# Patient Record
Sex: Female | Born: 1935 | Race: White | Hispanic: No | Smoking: Former smoker
Health system: Southern US, Community
[De-identification: ages and names within clinical notes are randomized; demographics above are authoritative.]

## PROBLEM LIST (undated history)

## (undated) DIAGNOSIS — I4891 Unspecified atrial fibrillation: Secondary | ICD-10-CM

## (undated) DIAGNOSIS — I10 Essential (primary) hypertension: Secondary | ICD-10-CM

## (undated) DIAGNOSIS — F32A Depression, unspecified: Secondary | ICD-10-CM

## (undated) DIAGNOSIS — D689 Coagulation defect, unspecified: Secondary | ICD-10-CM

## (undated) DIAGNOSIS — E079 Disorder of thyroid, unspecified: Secondary | ICD-10-CM

## (undated) DIAGNOSIS — E039 Hypothyroidism, unspecified: Secondary | ICD-10-CM

## (undated) DIAGNOSIS — J449 Chronic obstructive pulmonary disease, unspecified: Secondary | ICD-10-CM

## (undated) DIAGNOSIS — N3281 Overactive bladder: Secondary | ICD-10-CM

## (undated) DIAGNOSIS — C801 Malignant (primary) neoplasm, unspecified: Secondary | ICD-10-CM

## (undated) DIAGNOSIS — D509 Iron deficiency anemia, unspecified: Secondary | ICD-10-CM

## (undated) DIAGNOSIS — K921 Melena: Secondary | ICD-10-CM

## (undated) DIAGNOSIS — F329 Major depressive disorder, single episode, unspecified: Secondary | ICD-10-CM

## (undated) DIAGNOSIS — F419 Anxiety disorder, unspecified: Secondary | ICD-10-CM

## (undated) HISTORY — DX: Coagulation defect, unspecified: D68.9

## (undated) HISTORY — DX: Overactive bladder: N32.81

## (undated) HISTORY — DX: Depression, unspecified: F32.A

## (undated) HISTORY — DX: Major depressive disorder, single episode, unspecified: F32.9

## (undated) HISTORY — DX: Anxiety disorder, unspecified: F41.9

## (undated) HISTORY — PX: ABDOMINAL HYSTERECTOMY: SHX81

## (undated) HISTORY — DX: Disorder of thyroid, unspecified: E07.9

## (undated) HISTORY — DX: Chronic obstructive pulmonary disease, unspecified: J44.9

## (undated) HISTORY — DX: Unspecified atrial fibrillation: I48.91

## (undated) HISTORY — DX: Melena: K92.1

## (undated) HISTORY — PX: BREAST LUMPECTOMY: SHX2

---

## 2011-08-22 DIAGNOSIS — I480 Paroxysmal atrial fibrillation: Secondary | ICD-10-CM | POA: Insufficient documentation

## 2011-12-20 DIAGNOSIS — I10 Essential (primary) hypertension: Secondary | ICD-10-CM | POA: Insufficient documentation

## 2013-10-22 DIAGNOSIS — I471 Supraventricular tachycardia: Secondary | ICD-10-CM | POA: Insufficient documentation

## 2014-10-11 DIAGNOSIS — Z86718 Personal history of other venous thrombosis and embolism: Secondary | ICD-10-CM | POA: Insufficient documentation

## 2015-10-25 DIAGNOSIS — J9611 Chronic respiratory failure with hypoxia: Secondary | ICD-10-CM | POA: Insufficient documentation

## 2016-01-23 DIAGNOSIS — E034 Atrophy of thyroid (acquired): Secondary | ICD-10-CM | POA: Insufficient documentation

## 2016-06-19 DIAGNOSIS — Z9181 History of falling: Secondary | ICD-10-CM | POA: Insufficient documentation

## 2016-12-18 DIAGNOSIS — J449 Chronic obstructive pulmonary disease, unspecified: Secondary | ICD-10-CM

## 2016-12-18 DIAGNOSIS — J4489 Other specified chronic obstructive pulmonary disease: Secondary | ICD-10-CM

## 2016-12-18 DIAGNOSIS — I701 Atherosclerosis of renal artery: Secondary | ICD-10-CM | POA: Insufficient documentation

## 2016-12-18 HISTORY — DX: Other specified chronic obstructive pulmonary disease: J44.89

## 2016-12-18 HISTORY — DX: Chronic obstructive pulmonary disease, unspecified: J44.9

## 2017-08-13 DIAGNOSIS — Z9181 History of falling: Secondary | ICD-10-CM | POA: Insufficient documentation

## 2017-08-13 DIAGNOSIS — S81802A Unspecified open wound, left lower leg, initial encounter: Secondary | ICD-10-CM | POA: Insufficient documentation

## 2017-12-16 DIAGNOSIS — R600 Localized edema: Secondary | ICD-10-CM | POA: Insufficient documentation

## 2018-01-13 ENCOUNTER — Telehealth: Payer: Self-pay | Admitting: Family Medicine

## 2018-01-13 NOTE — Telephone Encounter (Signed)
Copied from [place] [phone]. Topic: General - Other >> Jan 13, 2018  9:55 AM Oneta Rack wrote: Relation to pt: self Call back number: [phone]   Reason for call:  Littie Deeds - [phone] (patient of Dr. Wynetta Emery) referred her mother to establish care with Dr. Wynetta Emery, daughter states PCP approved. Daughter would like to know if patient can be squeezed in sometime next week due to medication / oxygen refills. Daughter states previous PCP unable to refill, please advise >> Jan 13, 2018  9:59 AM Oneta Rack wrote: Relation to pt: self Call back number: [phone]   Reason for call:  Littie Deeds - [phone] (patient of Dr. Wynetta Emery) referred her mother to establish care with Dr. Wynetta Emery, daughter states PCP approved. Daughter would like to know if patient can be squeezed in sometime next week due to medication / oxygen refill. Daughter states previous PCP unable to refill, please advise >> Jan 13, 2018 10:53 AM Don Perking M wrote: Please advise.

## 2018-01-13 NOTE — Telephone Encounter (Signed)
I am aware of this patient. OK to book next Friday (5/24) unfortunately, I think that's the earliest I can do

## 2018-01-17 ENCOUNTER — Telehealth: Payer: Self-pay | Admitting: Family Medicine

## 2018-01-17 NOTE — Telephone Encounter (Signed)
Unable to reach pt. Will call back to schedule.

## 2018-01-17 NOTE — Telephone Encounter (Signed)
appt scheduled

## 2018-01-17 NOTE — Telephone Encounter (Signed)
Copied from [place] [phone]. Topic: General - Other >> Jan 13, 2018  9:55 AM Oneta Rack wrote: Relation to pt: self Call back number: [phone]   Reason for call:  Littie Deeds - [phone] (patient of Dr. Wynetta Emery) referred her mother to establish care with Dr. Wynetta Emery, daughter states PCP approved. Daughter would like to know if patient can be squeezed in sometime next week due to medication / oxygen refills. Daughter states previous PCP unable to refill, please advise >> Jan 13, 2018  9:59 AM Oneta Rack wrote: Relation to pt: self Call back number: [phone]   Reason for call:  Littie Deeds - [phone] (patient of Dr. Wynetta Emery) referred her mother to establish care with Dr. Wynetta Emery, daughter states PCP approved. Daughter would like to know if patient can be squeezed in sometime next week due to medication / oxygen refill. Daughter states previous PCP unable to refill, please advise >> Jan 13, 2018 10:53 AM Don Perking M wrote: Please advise. >> Jan 17, 2018 10:03 AM Cleaster Corin, NT wrote: Pt. Daughter calling back she gave her number instead of pt. Number. Pt. Can be reached at [phone]

## 2018-02-07 ENCOUNTER — Ambulatory Visit (INDEPENDENT_AMBULATORY_CARE_PROVIDER_SITE_OTHER): Payer: Medicare Other | Admitting: Family Medicine

## 2018-02-07 ENCOUNTER — Encounter: Payer: Self-pay | Admitting: Family Medicine

## 2018-02-07 VITALS — BP 136/65 | HR 56 | Temp 97.4°F | Ht 60.0 in | Wt 94.2 lb

## 2018-02-07 DIAGNOSIS — I4891 Unspecified atrial fibrillation: Secondary | ICD-10-CM

## 2018-02-07 DIAGNOSIS — Z9981 Dependence on supplemental oxygen: Secondary | ICD-10-CM | POA: Insufficient documentation

## 2018-02-07 DIAGNOSIS — N3281 Overactive bladder: Secondary | ICD-10-CM | POA: Insufficient documentation

## 2018-02-07 DIAGNOSIS — E079 Disorder of thyroid, unspecified: Secondary | ICD-10-CM

## 2018-02-07 DIAGNOSIS — Z13220 Encounter for screening for lipoid disorders: Secondary | ICD-10-CM

## 2018-02-07 DIAGNOSIS — S81802A Unspecified open wound, left lower leg, initial encounter: Secondary | ICD-10-CM | POA: Diagnosis not present

## 2018-02-07 DIAGNOSIS — F329 Major depressive disorder, single episode, unspecified: Secondary | ICD-10-CM | POA: Insufficient documentation

## 2018-02-07 DIAGNOSIS — Z23 Encounter for immunization: Secondary | ICD-10-CM

## 2018-02-07 DIAGNOSIS — I10 Essential (primary) hypertension: Secondary | ICD-10-CM

## 2018-02-07 DIAGNOSIS — F339 Major depressive disorder, recurrent, unspecified: Secondary | ICD-10-CM | POA: Diagnosis not present

## 2018-02-07 DIAGNOSIS — J449 Chronic obstructive pulmonary disease, unspecified: Secondary | ICD-10-CM | POA: Insufficient documentation

## 2018-02-07 DIAGNOSIS — H409 Unspecified glaucoma: Secondary | ICD-10-CM

## 2018-02-07 DIAGNOSIS — F32A Depression, unspecified: Secondary | ICD-10-CM | POA: Insufficient documentation

## 2018-02-07 DIAGNOSIS — E039 Hypothyroidism, unspecified: Secondary | ICD-10-CM | POA: Insufficient documentation

## 2018-02-07 LAB — UA/M W/RFLX CULTURE, ROUTINE
Bilirubin, UA: NEGATIVE
Glucose, UA: NEGATIVE
Ketones, UA: NEGATIVE
Leukocytes, UA: NEGATIVE
Nitrite, UA: NEGATIVE
Protein, UA: NEGATIVE
RBC, UA: NEGATIVE
Specific Gravity, UA: 1.015 (ref 1.005–1.030)
Urobilinogen, Ur: 0.2 mg/dL (ref 0.2–1.0)
pH, UA: 6.5 (ref 5.0–7.5)

## 2018-02-07 LAB — MICROALBUMIN, URINE WAIVED
Creatinine, Urine Waived: 100 mg/dL (ref 10–300)
Microalb, Ur Waived: 10 mg/L (ref 0–19)
Microalb/Creat Ratio: <30 mg/g (ref <30)

## 2018-02-07 MED ORDER — METOPROLOL TARTRATE 50 MG PO TABS: 50.0000 mg | ORAL_TABLET | Freq: Two times a day (BID) | ORAL | 1 refills | Status: DC

## 2018-02-07 MED ORDER — ASPIRIN EC 81 MG PO TBEC: 81.0000 mg | DELAYED_RELEASE_TABLET | Freq: Every day | ORAL | 4 refills | Status: DC

## 2018-02-07 MED ORDER — METOPROLOL TARTRATE 25 MG PO TABS: 25.0000 mg | ORAL_TABLET | Freq: Two times a day (BID) | ORAL | 1 refills | Status: DC

## 2018-02-07 MED ORDER — SOLIFENACIN SUCCINATE 10 MG PO TABS: 10.0000 mg | ORAL_TABLET | Freq: Every day | ORAL | 1 refills | Status: DC

## 2018-02-07 MED ORDER — AMLODIPINE BESYLATE 2.5 MG PO TABS: 2.5000 mg | ORAL_TABLET | Freq: Every day | ORAL | 1 refills | Status: DC

## 2018-02-07 NOTE — Assessment & Plan Note (Signed)
Stable. Not on any medicine. Call with any concerns.

## 2018-02-07 NOTE — Assessment & Plan Note (Signed)
Previously oxygen dependent on 2L. Oxygen did not drop with walking. Will recheck next visit.

## 2018-02-07 NOTE — Assessment & Plan Note (Signed)
Rate controlled. In regular rhythm today. Will refill her metoprolol. Continue aspirin. Obtain records. Referral to cardiology generated today.

## 2018-02-07 NOTE — Assessment & Plan Note (Addendum)
Oxygen dependent on 2L. Oxygen did not drop with walking. Will recheck next visit.

## 2018-02-07 NOTE — Progress Notes (Deleted)
Create Note1 Nice Template2 [employer] Base Note Template              My NoteIncomplete(Auto-Saved) 1:53 PM             NoteWriter selections for this note were unable to be saved. Complete documentation in a new note, and then delete this note.                             untitled image     BP (!) 153/71 (BP Location: Left Arm, Patient Position: Sitting, Cuff Size: Normal)   Pulse (!) 57   Temp (!) 97.4 F (36.3 C)   Ht 5' (1.524 m)   Wt 94 lb 4 oz (42.8 kg)   SpO2 96%   BMI 18.41 kg/m        Subjective:        Patient ID: [name], female    DOB: 10/19/1927, 84 y.o.   MRN: [id number]     HPI:  [name] is a 84 y.o. female who presents today to establish care. She recently moved here from [place], [place].         Chief Complaint    Patient presents with    .   Atrial Fibrillation    .   COPD    .   Hypertension    .   Hypothyroidism    .   Establish Care                Active Ambulatory Problems        Diagnosis   Date Noted    .   COPD (chronic obstructive pulmonary disease) ([place])        .   Atrial fibrillation ([place])        .   Depression        .   Overactive bladder        .   Hypothyroidism        .   Essential hypertension   02/07/2018    .   Oxygen dependent   02/07/2018            Resolved Ambulatory Problems        Diagnosis   Date Noted    .   No Resolved Ambulatory Problems            Past Medical History:    Diagnosis   Date    .   Anxiety        .   Atrial fibrillation ([place])        .   COPD (chronic obstructive pulmonary disease) ([place])        .   Depression        .   Overactive bladder        .   Thyroid disease                 Past Surgical History:    Procedure   Laterality   Date    .    ABDOMINAL HYSTERECTOMY            .   BREAST LUMPECTOMY                       Outpatient Encounter Medications as of  02/07/2018    Medication   Sig    .   amLODipine (NORVASC) 2.5 MG tablet   Take 1 tablet (2.5 mg total) by mouth daily.    Marland Kitchen   aspirin EC 81 MG tablet   Take 1 tablet (81 mg total) by mouth daily.    Marland Kitchen   levothyroxine (SYNTHROID, LEVOTHROID) 88 MCG tablet   Take 88 mcg by mouth daily before breakfast.    .   metoprolol tartrate (LOPRESSOR) 25 MG tablet   Take 1 tablet (25 mg total) by mouth 2 (two) times daily.    .   metoprolol tartrate (LOPRESSOR) 50 MG tablet   Take 1 tablet (50 mg total) by mouth 2 (two) times daily.    .   solifenacin (VESICARE) 10 MG tablet   Take 1 tablet (10 mg total) by mouth daily.    .   [DISCONTINUED] amLODipine (NORVASC) 2.5 MG tablet   Take 2.5 mg by mouth daily.    .   [DISCONTINUED] aspirin EC 81 MG tablet   Take 81 mg by mouth daily.    .   [DISCONTINUED] metoprolol tartrate (LOPRESSOR) 25 MG tablet   Take 25 mg by mouth 2 (two) times daily.    .   [DISCONTINUED] metoprolol tartrate (LOPRESSOR) 50 MG tablet   Take 50 mg by mouth 2 (two) times daily.    .   [DISCONTINUED] solifenacin (VESICARE) 10 MG tablet   Take 10 mg by mouth daily.        No facility-administered encounter medications on file as of 02/07/2018.        No Known Allergies      Social History             Socioeconomic History    .   Marital status:   Divorced            Spouse name:   Not on file    .   Number of children:   Not on file    .   Years of education:   Not on file    .   Highest education level:   Not on file    Occupational History    .   Not on file    Social Needs    .   Financial resource strain:   Not on file    .   Food insecurity:            Worry:   Not on file            Inability:    Not on file    .   Transportation needs:            Medical:   Not on file            Non-medical:   Not on file    Tobacco Use    .   Smoking status:   Former Research scientist (life sciences)    .   Smokeless tobacco:   Never Used    Substance and Sexual Activity    .   Alcohol use:   Never            Frequency:   Never    .   Drug use:   Never    .   Sexual activity:   Not Currently    Lifestyle    .   Physical activity:  Days per week:   Not on file            Minutes per session:   Not on file    .   Stress:   Not on file    Relationships    .   Social connections:            Talks on phone:   Not on file            Gets together:   Not on file            Attends religious service:   Not on file            Active member of club or organization:   Not on file            Attends meetings of clubs or organizations:   Not on file            Relationship status:   Not on file    Other Topics   Concern    .   Not on file    Social History Narrative    .   Not on file             Family History    Problem   Relation   Age of Onset    .   Heart disease   Mother        .   Parkinson's disease   Father        .   Heart disease   Sister        .   Heart disease   Son           Review of Systems   Constitutional: Negative.    HENT: Negative.    Respiratory: Negative.    Cardiovascular: Negative.    Gastrointestinal: Negative.    Musculoskeletal: Negative.    Neurological: Negative.    Psychiatric/Behavioral: Negative.          Per HPI unless specifically indicated above          Objective:      BP (!) 153/71 (BP Location: Left Arm, Patient Position: Sitting, Cuff Size: Normal)   Pulse (!) 57   Temp (!) 97.4 F (36.3 C)   Ht 5' (1.524 m)   Wt 94 lb 4 oz (42.8  kg)   SpO2 96%   BMI 18.41 kg/m        Wt Readings from Last 3 Encounters:    02/07/18   94 lb 4 oz (42.8 kg)       Physical Exam   Constitutional: She is oriented to person, place, and time. She appears well-developed and well-nourished. No distress.   HENT:   Head: Normocephalic and atraumatic.  Right Ear: Hearing normal.  Left Ear: Hearing normal.   Nose: Nose normal.   Eyes: Conjunctivae and lids are normal. Right eye exhibits no discharge. Left eye exhibits no discharge. No scleral icterus.   Cardiovascular: Normal rate, regular rhythm, normal heart sounds and intact distal pulses. Exam reveals no gallop and no friction rub.   No murmur heard.  Pulmonary/Chest: Effort normal. No stridor. No respiratory distress. She has wheezes. She has no rales. She exhibits no tenderness.  Musculoskeletal: Normal range of motion.  Neurological: She is alert and oriented to person, place, and time.   Skin: Skin is warm, dry and intact. Capillary refill takes less than 2 seconds. No rash noted. She is not diaphoretic.  No erythema. No pallor.  Open shallow wound about 3 inches long and 1 inch wide on anterior L shin, has been there since February and not healing  Psychiatric: She has a normal mood and affect. Her speech is normal and behavior is normal. Judgment and thought content normal. Cognition and memory are normal.   Nursing note and vitals reviewed.        No results found for this or any previous visit.       Assessment & Plan:    Problem List Items Addressed This Visit      Cardiovascular and Mediastinum   Atrial fibrillation ([place])    Rate controlled. In regular rhythm today. Will refill her metoprolol. Continue aspirin. Obtain records. Referral to cardiology generated today.      Relevant Medications   metoprolol tartrate (LOPRESSOR) 50 MG tablet   metoprolol tartrate (LOPRESSOR) 25 MG tablet   aspirin EC 81 MG tablet   amLODipine (NORVASC) 2.5 MG  tablet   Other Relevant Orders   CBC with Differential/Platelet   Comprehensive metabolic panel   Ambulatory referral to Cardiology   Essential hypertension - Primary    Better on recheck. Continue current regimen. Continue to monitor. Call with any concerns.       Relevant Medications   metoprolol tartrate (LOPRESSOR) 50 MG tablet   metoprolol tartrate (LOPRESSOR) 25 MG tablet   aspirin EC 81 MG tablet   amLODipine (NORVASC) 2.5 MG tablet   Other Relevant Orders   CBC with Differential/Platelet   Comprehensive metabolic panel   Microalbumin, Urine Waived     Respiratory   COPD (chronic obstructive pulmonary disease) ([facility])    Oxygen dependent on 2L. Oxygen did not drop with walking. Will recheck next visit.       Relevant Orders   CBC with Differential/Platelet   Comprehensive metabolic panel     Endocrine   Hypothyroidism    Will check levels and adjust dose as needed. Labs drawn today.      Relevant Medications   levothyroxine (SYNTHROID, LEVOTHROID) 88 MCG tablet   metoprolol tartrate (LOPRESSOR) 50 MG tablet   metoprolol tartrate (LOPRESSOR) 25 MG tablet     Genitourinary   Overactive bladder    Doing well on her vesicare. Continue current regimen. Continue to monitor. Call with any concerns.       Relevant Orders   CBC with Differential/Platelet   Comprehensive metabolic panel   UA/M w/rflx Culture, Routine     Other   Depression    Stable. Not on any medicine. Call with any concerns.       Relevant Orders   CBC with Differential/Platelet   Comprehensive metabolic panel   Oxygen dependent    Previously oxygen dependent on 2L. Oxygen did not drop with walking. Will recheck next visit.        Other Visit Diagnoses    Screening for cholesterol level       Labs checked today. Await results.    Relevant Orders   Lipid Panel w/o Chol/HDL Ratio   Open wound of left lower leg, initial encounter       Has not healed in 4 months. Will get her into wound  center for evaluation.    Relevant Orders   Td : Tetanus/diphtheria >7yo Preservative  free (Completed)   Ambulatory referral to Wound Clinic   Glaucoma of both eyes, unspecified glaucoma type       Does not have her eye drops. Will get her into  opthalmology ASAP for evaluation and treatment.    Relevant Orders   Ambulatory referral to Ophthalmology          Follow up plan:  Return in about 3 months (around 05/10/2018), or Wellness/Physical, for REcords release from [facility] please.

## 2018-02-07 NOTE — Assessment & Plan Note (Signed)
Will check levels and adjust dose as needed. Labs drawn today.

## 2018-02-07 NOTE — Assessment & Plan Note (Signed)
Doing well on her vesicare. Continue current regimen. Continue to monitor. Call with any concerns.

## 2018-02-07 NOTE — Progress Notes (Signed)
BP 136/65 (BP Location: Left Arm, Cuff Size: Normal)   Pulse (!) 56   Temp (!) 97.4 F (36.3 C)   Ht 5' (1.524 m)   Wt 94 lb 4 oz (42.8 kg)   SpO2 96%   BMI 18.41 kg/m    Subjective:    Patient ID: [name], female    DOB: 1928/02/14, 84 y.o.   MRN: [id number]  HPI: [name] is a 84 y.o. female  Chief Complaint  Patient presents with  . Atrial Fibrillation  . COPD  . Hypertension  . Hypothyroidism  . Establish Care   ATRIAL FIBRILLATION  Atrial fibrillation status: stable  Satisfied with current treatment: yes   Medication side effects:  no  Medication compliance: excellent compliance  Palpitations:  no  Chest pain:  no  Dyspnea on exertion:  yes  Orthopnea:  no  Syncope:  no  Edema:  no   Ventricular rate control: B-blocker  Anti-coagulation: aspirin     HYPOTHYROIDISM  Thyroid control status:stable  Satisfied with current treatment? yes  Medication side effects: no  Medication compliance: excellent compliance  Recent dose adjustment:no  Fatigue: no  Cold intolerance: yes  Heat intolerance: no  Weight gain: no  Weight loss: no  Constipation: no  Diarrhea/loose stools: no  Palpitations: no  Lower extremity edema: no  Anxiety/depressed mood: no     HYPERTENSION  Hypertension status: stable   Satisfied with current treatment? yes  Duration of hypertension: chronic  BP monitoring frequency:  not checking  BP medication side effects:  no  Medication compliance: excellent compliance  Previous BP meds:metoprolol and amlodipine  Aspirin: yes  Recurrent headaches: no  Visual changes: no  Palpitations: no  Dyspnea: no  Chest pain: no  Lower extremity edema: no  Dizzy/lightheaded: no     COPD  COPD status: stable  Satisfied with current treatment?: yes  Oxygen use: yes 2L continuously  Dyspnea frequency: often  Cough frequency: often  Rescue inhaler frequency: never     Limitation of activity: yes  Productive cough: no   Relevant past medical, surgical, family and social history reviewed and updated as indicated. Interim medical history since our last visit reviewed. Allergies and medications reviewed and updated.  Review of Systems   Review of Systems  Constitutional: Negative.   HENT: Negative.   Respiratory: Negative.   Cardiovascular: Negative.   Gastrointestinal: Negative.   Musculoskeletal: Negative.   Neurological: Negative.   Psychiatric/Behavioral: Negative.      Per HPI unless specifically indicated above     Objective:    BP 136/65 (BP Location: Left Arm, Cuff Size: Normal)   Pulse (!) 56   Temp (!) 97.4 F (36.3 C)   Ht 5' (1.524 m)   Wt 94 lb 4 oz (42.8 kg)   SpO2 96%   BMI 18.41 kg/m   Wt Readings from Last 3 Encounters:  02/07/18 94 lb 4 oz (42.8 kg)    Physical Exam  Physical Exam  Constitutional: She is oriented to person, place, and time. She appears well-developed and well-nourished. No distress.  HENT:  Head: Normocephalic and atraumatic.  Right Ear: Hearing normal.  Left Ear: Hearing normal.  Nose: Nose normal.  Eyes: Conjunctivae and lids are normal. Right eye exhibits no discharge. Left eye exhibits no discharge. No scleral icterus.  Cardiovascular: Normal rate, regular rhythm, normal heart sounds and intact distal pulses. Exam reveals no gallop and no friction rub.  No murmur heard.  Pulmonary/Chest: Effort normal. No  stridor. No respiratory distress. She has wheezes. She has no rales. She exhibits no tenderness.  Musculoskeletal: Normal range of motion.  Neurological: She is alert and oriented to person, place, and time.  Skin: Skin is warm, dry and intact. Capillary refill takes less than 2 seconds. No rash noted. She is not diaphoretic. No erythema. No pallor.  Open shallow wound about 3 inches long and 1 inch wide on anterior L shin, has been there since February and not healing  Psychiatric:  She has a normal mood and affect. Her speech is normal and behavior is normal. Judgment and thought content normal. Cognition and memory are normal.  Nursing note and vitals reviewed.   No results found for this or any previous visit.    Assessment & Plan:   Problem List Items Addressed This Visit      Cardiovascular and Mediastinum   Atrial fibrillation ([place])    Rate controlled. In regular rhythm today. Will refill her metoprolol. Continue aspirin. Obtain records. Referral to cardiology generated today.      Relevant Medications   metoprolol tartrate (LOPRESSOR) 50 MG tablet   metoprolol tartrate (LOPRESSOR) 25 MG tablet   aspirin EC 81 MG tablet   amLODipine (NORVASC) 2.5 MG tablet   Other Relevant Orders   CBC with Differential/Platelet   Comprehensive metabolic panel   Ambulatory referral to Cardiology   Essential hypertension - Primary    Better on recheck. Continue current regimen. Continue to monitor. Call with any concerns.       Relevant Medications   metoprolol tartrate (LOPRESSOR) 50 MG tablet   metoprolol tartrate (LOPRESSOR) 25 MG tablet   aspirin EC 81 MG tablet   amLODipine (NORVASC) 2.5 MG tablet   Other Relevant Orders   CBC with Differential/Platelet   Comprehensive metabolic panel   Microalbumin, Urine Waived     Respiratory   COPD (chronic obstructive pulmonary disease) ([facility])    Oxygen dependent on 2L. Oxygen did not drop with walking. Will recheck next visit.       Relevant Orders   CBC with Differential/Platelet   Comprehensive metabolic panel     Endocrine   Hypothyroidism    Will check levels and adjust dose as needed. Labs drawn today.      Relevant Medications   levothyroxine (SYNTHROID, LEVOTHROID) 88 MCG tablet   metoprolol tartrate (LOPRESSOR) 50 MG tablet   metoprolol tartrate (LOPRESSOR) 25 MG tablet     Genitourinary   Overactive bladder    Doing well on her vesicare. Continue current regimen. Continue to monitor. Call with  any concerns.       Relevant Orders   CBC with Differential/Platelet   Comprehensive metabolic panel   UA/M w/rflx Culture, Routine     Other   Depression    Stable. Not on any medicine. Call with any concerns.       Relevant Orders   CBC with Differential/Platelet   Comprehensive metabolic panel   Oxygen dependent    Previously oxygen dependent on 2L. Oxygen did not drop with walking. Will recheck next visit.        Other Visit Diagnoses    Screening for cholesterol level       Labs checked today. Await results.    Relevant Orders   Lipid Panel w/o Chol/HDL Ratio   Open wound of left lower leg, initial encounter       Has not healed in 4 months. Will get her into wound center for evaluation.  Relevant Orders   Td : Tetanus/diphtheria >7yo Preservative  free (Completed)   Ambulatory referral to Wound Clinic   Glaucoma of both eyes, unspecified glaucoma type       Does not have her eye drops. Will get her into opthalmology ASAP for evaluation and treatment.    Relevant Orders   Ambulatory referral to Ophthalmology       Follow up plan: Return in about 3 months (around 05/10/2018), or Wellness/Physical, for REcords release from [facility] please.

## 2018-02-07 NOTE — Assessment & Plan Note (Signed)
Better on recheck. Continue current regimen. Continue to monitor. Call with any concerns.  

## 2018-02-08 LAB — CBC WITH DIFFERENTIAL/PLATELET
Basophils Absolute: 0.1 10*3/uL (ref 0.0–0.2)
Basos: 1 %
EOS (ABSOLUTE): 0.4 10*3/uL (ref 0.0–0.4)
Eos: 5 %
Hematocrit: 41.2 % (ref 34.0–46.6)
Hemoglobin: 13.6 g/dL (ref 11.1–15.9)
Immature Grans (Abs): 0 10*3/uL (ref 0.0–0.1)
Immature Granulocytes: 0 %
Lymphocytes Absolute: 2.5 10*3/uL (ref 0.7–3.1)
Lymphs: 27 %
MCH: 29.1 pg (ref 26.6–33.0)
MCHC: 33 g/dL (ref 31.5–35.7)
MCV: 88 fL (ref 79–97)
Monocytes Absolute: 0.7 10*3/uL (ref 0.1–0.9)
Monocytes: 8 %
Neutrophils Absolute: 5.4 10*3/uL (ref 1.4–7.0)
Neutrophils: 59 %
Platelets: 285 10*3/uL (ref 150–450)
RBC: 4.68 x10E6/uL (ref 3.77–5.28)
RDW: 14.6 % (ref 12.3–15.4)
WBC: 9 10*3/uL (ref 3.4–10.8)

## 2018-02-08 LAB — COMPREHENSIVE METABOLIC PANEL
ALT: 11 IU/L (ref 0–32)
AST: 22 IU/L (ref 0–40)
Albumin/Globulin Ratio: 1.7 (ref 1.2–2.2)
Albumin: 4 g/dL (ref 3.5–4.7)
Alkaline Phosphatase: 90 IU/L (ref 39–117)
BUN/Creatinine Ratio: 15 (ref 12–28)
BUN: 17 mg/dL (ref 8–27)
Bilirubin Total: 0.3 mg/dL (ref 0.0–1.2)
CO2: 26 mmol/L (ref 20–29)
Calcium: 9.3 mg/dL (ref 8.7–10.3)
Chloride: 101 mmol/L (ref 96–106)
Creatinine, Ser: 1.17 mg/dL — ABNORMAL HIGH (ref 0.57–1.00)
GFR calc Af Amer: 48 mL/min/{1.73_m2} — ABNORMAL LOW (ref >59)
GFR calc non Af Amer: 42 mL/min/{1.73_m2} — ABNORMAL LOW (ref >59)
Globulin, Total: 2.4 g/dL (ref 1.5–4.5)
Glucose: 83 mg/dL (ref 65–99)
Potassium: 5 mmol/L (ref 3.5–5.2)
Sodium: 138 mmol/L (ref 134–144)
Total Protein: 6.4 g/dL (ref 6.0–8.5)

## 2018-02-08 LAB — THYROID PANEL WITH TSH
Free Thyroxine Index: 2.5 (ref 1.2–4.9)
T3 Uptake Ratio: 27 % (ref 24–39)
T4, Total: 9.2 ug/dL (ref 4.5–12.0)
TSH: 9.33 u[IU]/mL — ABNORMAL HIGH (ref 0.450–4.500)

## 2018-02-08 LAB — LIPID PANEL W/O CHOL/HDL RATIO
Cholesterol, Total: 180 mg/dL (ref 100–199)
HDL: 60 mg/dL (ref >39)
LDL Calculated: 94 mg/dL (ref 0–99)
Triglycerides: 132 mg/dL (ref 0–149)
VLDL Cholesterol Cal: 26 mg/dL (ref 5–40)

## 2018-02-11 ENCOUNTER — Telehealth: Payer: Self-pay | Admitting: Family Medicine

## 2018-02-11 DIAGNOSIS — E079 Disorder of thyroid, unspecified: Secondary | ICD-10-CM

## 2018-02-11 MED ORDER — LEVOTHYROXINE SODIUM 100 MCG PO TABS: 100.0000 ug | ORAL_TABLET | Freq: Every day | ORAL | 1 refills | Status: DC

## 2018-02-11 NOTE — Telephone Encounter (Signed)
Please let her know that her labs came back normal except her thyroid is under treated. I'm going to increase her thyroid medicine to 134mcg and I'd like her to come in for a blood test in 6 weeks for recheck. Order in. Thanks!

## 2018-02-12 ENCOUNTER — Encounter: Payer: Medicare (Managed Care) | Attending: Internal Medicine | Admitting: Internal Medicine

## 2018-02-12 DIAGNOSIS — I872 Venous insufficiency (chronic) (peripheral): Secondary | ICD-10-CM | POA: Diagnosis not present

## 2018-02-12 DIAGNOSIS — L97222 Non-pressure chronic ulcer of left calf with fat layer exposed: Secondary | ICD-10-CM | POA: Insufficient documentation

## 2018-02-12 NOTE — Telephone Encounter (Signed)
Patient notified

## 2018-02-14 NOTE — Progress Notes (Signed)
LAMARIA, HILDEBRANDT ([id number]) Visit Report for 02/12/2018 Abuse/Suicide Risk Screen Details Patient Name: [name], [name] Date of Service: 02/12/2018 2:15 PM Medical Record Number: [id number] Patient Account Number: [id number] Date of Birth/Sex: 08-27-1928 (84 y.o. F) Treating RN: Ahmed Prima Primary Care Temika Sutphin: Park Liter Other Clinician: Referring Jamason Peckham: Park Liter Treating Jasaiah Karwowski/Extender: Tito Dine in Treatment: 0 Abuse/Suicide Risk Screen Items Answer ABUSE/SUICIDE RISK SCREEN: Has anyone close to you tried to hurt or harm you recentlyo No Do you feel uncomfortable with anyone in your familyo No Has anyone forced you do things that you didnot want to doo No Do you have any thoughts of harming yourselfo No Patient displays signs or symptoms of abuse and/or neglect. No Electronic Signature(s) Signed: 02/12/2018 3:54:16 PM By: Alric Quan Entered By: Alric Quan on 02/12/2018 14:42:29 [name] ([id number]) -------------------------------------------------------------------------------- Activities of Daily Living Details Patient Name: [name], [name] Date of Service: 02/12/2018 2:15 PM Medical Record Number: [id number] Patient Account Number: [id number] Date of Birth/Sex: Jan 30, 1928 (84 y.o. F) Treating RN: Ahmed Prima Primary Care Daniil Labarge: Park Liter Other Clinician: Referring Bianca Raneri: Park Liter Treating Uriah Trueba/Extender: Tito Dine in Treatment: 0 Activities of Daily Living Items Answer Activities of Daily Living (Please select one for each item) Drive Automobile Completely Able Take Medications Completely Able Use Telephone Completely Able Care for Appearance Completely Able Use Toilet Completely Able Bath / Shower Completely Able Dress Self Completely Able Feed Self Completely Able Walk Completely Able Get In / Out Bed Completely Able Housework Completely Able Prepare Meals  Completely [place] for Self Completely Able Electronic Signature(s) Signed: 02/12/2018 3:54:16 PM By: Alric Quan Entered By: Alric Quan on 02/12/2018 14:43:00 [place], Joycelyn Schmid ([id number]) -------------------------------------------------------------------------------- Education Assessment Details Patient Name: [name] Date of Service: 02/12/2018 2:15 PM Medical Record Number: [id number] Patient Account Number: [id number] Date of Birth/Sex: [birth year] (84 y.o. F) Treating RN: Ahmed Prima Primary Care Giordana Weinheimer: Park Liter Other Clinician: Referring Kaiser Belluomini: Park Liter Treating Stuti Sandin/Extender: Tito Dine in Treatment: 0 Primary Learner Assessed: Patient Learning Preferences/Education Level/Primary Language Learning Preference: Explanation, Printed Material Highest Education Level: High School Cognitive Barrier Assessment/Beliefs Language Barrier: No Translator Needed: No Memory Deficit: No Emotional Barrier: No Cultural/Religious Beliefs Affecting Medical Care: No Physical Barrier Assessment Impaired Vision: No Impaired Hearing: Yes HOH Decreased Hand dexterity: No Knowledge/Comprehension Assessment Knowledge Level: Medium Comprehension Level: Medium Ability to understand written Medium instructions: Ability to understand verbal Medium instructions: Motivation Assessment Anxiety Level: Calm Cooperation: Cooperative Education Importance: Acknowledges Need Interest in Health Problems: Asks Questions Perception: Coherent Willingness to Engage in Self- Medium Management Activities: Readiness to Engage in Self- Medium Management Activities: Electronic Signature(s) Signed: 02/12/2018 3:54:16 PM By: Alric Quan Entered By: Alric Quan on 02/12/2018 14:43:36 [place], [place] ([id number]) -------------------------------------------------------------------------------- Fall Risk  Assessment Details Patient Name: [name] Date of Service: 02/12/2018 2:15 PM Medical Record Number: [id number] Patient Account Number: [id number] Date of Birth/Sex: Mar 01, 1928 (84 y.o. F) Treating RN: Ahmed Prima Primary Care Jayli Fogleman: Park Liter Other Clinician: Referring Kolina Kube: Park Liter Treating Kamau Weatherall/Extender: Tito Dine in Treatment: 0 Fall Risk Assessment Items Have you had 2 or more falls in the last 12 monthso 0 No Have you had any fall that resulted in injury in the last 12 monthso 0 Yes FALL RISK ASSESSMENT: History of falling - immediate or within 3 months 0 No Secondary diagnosis 15 Yes Ambulatory aid None/bed rest/wheelchair/nurse 0 No Crutches/cane/walker 0 No Furniture 0 No IV Access/Saline Lock 0 No Gait/Training Normal/bed rest/immobile  0 No Weak 0 No Impaired 0 No Mental Status Oriented to own ability 0 Yes Electronic Signature(s) Signed: 02/12/2018 3:54:16 PM By: Alric Quan Entered By: Alric Quan on 02/12/2018 14:44:22 [place], Blimi ([id number]) -------------------------------------------------------------------------------- Foot Assessment Details Patient Name: [name] Date of Service: 02/12/2018 2:15 PM Medical Record Number: [id number] Patient Account Number: [id number] Date of Birth/Sex: [birth year] (84 y.o. F) Treating RN: Ahmed Prima Primary Care Elya Diloreto: Park Liter Other Clinician: Referring Anielle Headrick: Park Liter Treating Khori Rosevear/Extender: Tito Dine in Treatment: 0 Foot Assessment Items Site Locations + = Sensation present, - = Sensation absent, C = Callus, U = Ulcer R = Redness, W = Warmth, M = Maceration, PU = Pre-ulcerative lesion F = Fissure, S = Swelling, D = Dryness Assessment Right: Left: Other Deformity: No No Prior Foot Ulcer: No No Prior Amputation: No No Charcot Joint: No No Ambulatory Status: Ambulatory Without Help Gait:  Steady Electronic Signature(s) Signed: 02/12/2018 3:54:16 PM By: Alric Quan Entered By: Alric Quan on 02/12/2018 14:47:10 [place], Joycelyn Schmid ([id number]) -------------------------------------------------------------------------------- Nutrition Risk Assessment Details Patient Name: [name] Date of Service: 02/12/2018 2:15 PM Medical Record Number: [id number] Patient Account Number: [id number] Date of Birth/Sex: 1928-02-18 (84 y.o. F) Treating RN: Ahmed Prima Primary Care Jaryan Chicoine: Park Liter Other Clinician: Referring Kristain Filo: Park Liter Treating Holleigh Crihfield/Extender: Tito Dine in Treatment: 0 Height (in): 60 Weight (lbs): 94.2 Body Mass Index (BMI): 18.4 Nutrition Risk Assessment Items NUTRITION RISK SCREEN: I have an illness or condition that made me change the kind and/or amount of 0 No food I eat I eat fewer than two meals per day 0 No I eat few fruits and vegetables, or milk products 0 No I have three or more drinks of beer, liquor or wine almost every day 0 No I have tooth or mouth problems that make it hard for me to eat 0 No I don't always have enough money to buy the food I need 0 No I eat alone most of the time 0 No I take three or more different prescribed or over-the-counter drugs a day 1 Yes Without wanting to, I have lost or gained 10 pounds in the last six months 0 No I am not always physically able to shop, cook and/or feed myself 0 No Nutrition Protocols Good Risk Protocol Moderate Risk Protocol Electronic Signature(s) Signed: 02/12/2018 3:54:16 PM By: Alric Quan Entered By: Alric Quan on 02/12/2018 14:44:37

## 2018-02-15 NOTE — Progress Notes (Addendum)
MAIGEN, MOZINGO ([id number]) Visit Report for 02/12/2018 Allergy List Details Patient Name: [name], [name] Date of Service: 02/12/2018 2:15 PM Medical Record Number: [id number] Patient Account Number: [id number] Date of Birth/Sex: 1928/04/30 (84 y.o. F) Treating RN: Ahmed Prima Primary Care Karrie Fluellen: Park Liter Other Clinician: Referring Uzziah Rigg: Park Liter Treating Zhara Gieske/Extender: Ricard Dillon Weeks in Treatment: 0 Allergies Active Allergies NKDA Allergy Notes Electronic Signature(s) Signed: 02/12/2018 3:54:16 PM By: Alric Quan Entered By: Alric Quan on 02/12/2018 14:33:28 [name] ([id number]) -------------------------------------------------------------------------------- Arrival Information Details Patient Name: [name] Date of Service: 02/12/2018 2:15 PM Medical Record Number: [id number] Patient Account Number: [id number] Date of Birth/Sex: [birth year] (84 y.o. F) Treating RN: Ahmed Prima Primary Care Humphrey Guerreiro: Park Liter Other Clinician: Referring Ceylin Dreibelbis: Park Liter Treating Lexi Conaty/Extender: Tito Dine in Treatment: 0 Visit Information Patient Arrived: Ambulatory Arrival Time: 14:26 Accompanied By: daughter Transfer Assistance: None Patient Identification Verified: Yes Secondary Verification Process Completed: Yes Patient Requires Transmission-Based No Precautions: Patient Has Alerts: No Electronic Signature(s) Signed: 02/12/2018 3:54:16 PM By: Alric Quan Entered By: Alric Quan on 02/12/2018 14:28:08 [name] ([id number]) -------------------------------------------------------------------------------- Clinic Level of Care Assessment Details Patient Name: [name] Date of Service: 02/12/2018 2:15 PM Medical Record Number: [id number] Patient Account Number: [id number] Date of Birth/Sex: 1928/03/21 (84 y.o. F) Treating RN: [name] Barman Primary Care Kollins Fenter:  Park Liter Other Clinician: Referring Kaelum Kissick: Park Liter Treating Jones Viviani/Extender: Tito Dine in Treatment: 0 Clinic Level of Care Assessment Items TOOL 1 Quantity Score []  - Use when EandM and Procedure is performed on INITIAL visit 0 ASSESSMENTS - Nursing Assessment / Reassessment X - General Physical Exam (combine w/ comprehensive assessment (listed just below) when 1 20 performed on Carol pt. evals) X- 1 25 Comprehensive Assessment (HX, ROS, Risk Assessments, Wounds Hx, etc.) ASSESSMENTS - Wound and Skin Assessment / Reassessment []  - Dermatologic / Skin Assessment (not related to wound area) 0 ASSESSMENTS - Ostomy and/or Continence Assessment and Care []  - Incontinence Assessment and Management 0 []  - 0 Ostomy Care Assessment and Management (repouching, etc.) PROCESS - Coordination of Care X - Simple Patient / Family Education for ongoing care 1 15 []  - 0 Complex (extensive) Patient / Family Education for ongoing care X- 1 10 Staff obtains Programmer, systems, Records, Test Results / Process Orders []  - 0 Staff telephones HHA, Nursing Homes / Clarify orders / etc []  - 0 Routine Transfer to another Facility (non-emergent condition) []  - 0 Routine Hospital Admission (non-emergent condition) X- 1 15 Carol Admissions / Biomedical engineer / Ordering NPWT, Apligraf, etc. []  - 0 Emergency Hospital Admission (emergent condition) PROCESS - Special Needs []  - Pediatric / Minor Patient Management 0 []  - 0 Isolation Patient Management []  - 0 Hearing / Language / Visual special needs []  - 0 Assessment of Community assistance (transportation, D/C planning, etc.) []  - 0 Additional assistance / Altered mentation []  - 0 Support Surface(s) Assessment (bed, cushion, seat, etc.) [place], [name] ([id number]) INTERVENTIONS - Miscellaneous []  - External ear exam 0 []  - 0 Patient Transfer (multiple staff / Civil Service fast streamer / Similar devices) []  - 0 Simple Staple / Suture  removal (25 or less) []  - 0 Complex Staple / Suture removal (26 or more) []  - 0 Hypo/Hyperglycemic Management (do not check if billed separately) X- 1 15 Ankle / Brachial Index (ABI) - do not check if billed separately Has the patient been seen at the hospital within the last three years: Yes Total Score: 100 Level Of Care: Carol/Established - Level 3 Electronic  Signature(s) Signed: 02/13/2018 8:47:48 AM By: Gretta Cool, BSN, RN, CWS, Kim RN, BSN Entered By: Gretta Cool, BSN, RN, CWS, Kim on 02/12/2018 15:23:41 [name], [name] ([id number]) -------------------------------------------------------------------------------- Encounter Discharge Information Details Patient Name: [name] Date of Service: 02/12/2018 2:15 PM Medical Record Number: [id number] Patient Account Number: [id number] Date of Birth/Sex: [birth year] (84 y.o. F) Treating RN: Montey Hora Primary Care Marquett Bertoli: Park Liter Other Clinician: Referring Kelse Ploch: Park Liter Treating Angle Karel/Extender: Tito Dine in Treatment: 0 Encounter Discharge Information Items Discharge Condition: Stable Ambulatory Status: Ambulatory Discharge Destination: Home Transportation: Private Auto Accompanied By: dtr Schedule Follow-up Appointment: Yes Clinical Summary of Care: Electronic Signature(s) Signed: 02/12/2018 3:49:28 PM By: Montey Hora Entered By: Montey Hora on 02/12/2018 15:49:27 [name] ([id number]) -------------------------------------------------------------------------------- Lower Extremity Assessment Details Patient Name: [name] Date of Service: 02/12/2018 2:15 PM Medical Record Number: [id number] Patient Account Number: [id number] Date of Birth/Sex: 1928-03-01 (84 y.o. F) Treating RN: Ahmed Prima Primary Care Dakota Stangl: Park Liter Other Clinician: Referring Tanzania Basham: Park Liter Treating Mccayla [name]/Extender: Tito Dine in Treatment: 0 Edema  Assessment Assessed: [Left: No] [Right: No] Edema: [Left: No] [Right: No] Calf Left: Right: Point of Measurement: 32 cm From Medial Instep 31.2 cm 28.7 cm Ankle Left: Right: Point of Measurement: 9 cm From Medial Instep 18.5 cm 17.7 cm Vascular Assessment Pulses: Dorsalis Pedis Palpable: [Left:Yes] [Right:Yes] Posterior Tibial Extremity colors, hair growth, and conditions: Extremity Color: [Left:Hyperpigmented] [Right:Hyperpigmented] Hair Growth on Extremity: [Left:Yes] [Right:Yes] Temperature of Extremity: [Left:Cool] [Right:Cool] Capillary Refill: [Left:> 3 seconds] [Right:> 3 seconds] Blood Pressure: Brachial: [Left:124] [Right:124] Dorsalis Pedis: 128 [Left:Dorsalis Pedis: 619] Ankle: Posterior Tibial: 120 [Left:Posterior Tibial: 160 1.03] [Right:1.32] Toe Nail Assessment Left: Right: Thick: Yes Yes Discolored: Yes Yes Deformed: No No Improper Length and Hygiene: Yes Yes Electronic Signature(s) Signed: 02/12/2018 3:54:16 PM By: Alric Quan Entered By: Alric Quan on 02/12/2018 15:03:28 [name] ([id number]) -------------------------------------------------------------------------------- Multi Wound Chart Details Patient Name: [name] Date of Service: 02/12/2018 2:15 PM Medical Record Number: [id number] Patient Account Number: [id number] Date of Birth/Sex: Aug 24, 1928 (84 y.o. F) Treating RN: [name] Barman Primary Care Renel Ende: Park Liter Other Clinician: Referring Jonesha Tsuchiya: Park Liter Treating Donneisha Beane/Extender: Tito Dine in Treatment: 0 Vital Signs Height(in): 60 Pulse(bpm): 75 Weight(lbs): 94.2 Blood Pressure(mmHg): 145/64 Body Mass Index(BMI): 18 Temperature(F): 98.0 Respiratory Rate 16 (breaths/min): Photos: [1:No Photos] [N/A:N/A] Wound Location: [1:Left Lower Leg - Lateral] [N/A:N/A] Wounding Event: [1:Trauma] [N/A:N/A] Primary Etiology: [1:Trauma, Other] [N/A:N/A] Comorbid History: [1:Cataracts,  Glaucoma, Asthma, N/A Chronic Obstructive Pulmonary Disease (COPD), Arrhythmia, Hypertension] Date Acquired: [1:10/21/2017] [N/A:N/A] Weeks of Treatment: [1:0] [N/A:N/A] Wound Status: [1:Open] [N/A:N/A] Measurements L x W x D [1:2x1.5x0.1] [N/A:N/A] (cm) Area (cm) : [1:2.356] [N/A:N/A] Volume (cm) : [1:0.236] [N/A:N/A] Classification: [1:Full Thickness Without Exposed Support Structures] [N/A:N/A] Exudate Amount: [1:Large] [N/A:N/A] Exudate Type: [1:Serosanguineous] [N/A:N/A] Exudate Color: [1:red, brown] [N/A:N/A] Wound Margin: [1:Flat and Intact] [N/A:N/A] Granulation Amount: [1:Small (1-33%)] [N/A:N/A] Granulation Quality: [1:Red] [N/A:N/A] Necrotic Amount: [1:Large (67-100%)] [N/A:N/A] Exposed Structures: [1:Fascia: No Fat Layer (Subcutaneous Tissue) Exposed: No Tendon: No Muscle: No Joint: No Bone: No] [N/A:N/A] Epithelialization: [1:None] [N/A:N/A] Periwound Skin Texture: [1:No Abnormalities Noted] [N/A:N/A] Periwound Skin Moisture: [1:No Abnormalities Noted] [N/A:N/A] Periwound Skin Color: [1:No Abnormalities Noted] [N/A:N/A] Temperature: [1:No Abnormality] [N/A:N/A] Tenderness on Palpation: [1:Yes] [N/A:N/A] Wound Preparation: Ulcer Cleansing: N/A N/A Rinsed/Irrigated with Saline Topical Anesthetic Applied: Other: lidocaine 4% Treatment Notes Electronic Signature(s) Signed: 02/13/2018 8:47:48 AM By: Gretta Cool, BSN, RN, CWS, Kim RN, BSN Entered By: Gretta Cool, BSN, RN, CWS, Kim on 02/12/2018 15:18:23 [name], [name] ([id number]) --------------------------------------------------------------------------------  Multi-Disciplinary Care Plan Details Patient Name: [name], [name] Date of Service: 02/12/2018 2:15 PM Medical Record Number: [id number] Patient Account Number: [id number] Date of Birth/Sex: 04-13-1928 (84 y.o. F) Treating RN: [name] Barman Primary Care Keyerra Lamere: Park Liter Other Clinician: Referring Yulitza Shorts: Park Liter Treating Zigmond Trela/Extender: Tito Dine in Treatment: 0 Active Inactive ` Necrotic Tissue Nursing Diagnoses: Impaired tissue integrity related to necrotic/devitalized tissue Goals: Necrotic/devitalized tissue will be minimized in the wound bed Date Initiated: 02/12/2018 Target Resolution Date: 03/07/2018 Goal Status: Active Interventions: Provide education on necrotic tissue and debridement process Treatment Activities: Apply topical anesthetic as ordered : 02/12/2018 Notes: ` Orientation to the Wound Care Program Nursing Diagnoses: Knowledge deficit related to the wound healing center program Goals: Patient/caregiver will verbalize understanding of the [facility] Date Initiated: 02/12/2018 Target Resolution Date: 03/07/2018 Goal Status: Active Interventions: Provide education on orientation to the wound center Notes: ` Pain, Acute or Chronic Nursing Diagnoses: Potential alteration in comfort, pain Goals: Patient will verbalize adequate pain control and receive pain control interventions during procedures as needed Date Initiated: 02/12/2018 Target Resolution Date: 03/07/2018 [name], [name] ([id number]) Goal Status: Active Interventions: Reposition patient for comfort Notes: ` Wound/Skin Impairment Nursing Diagnoses: Impaired tissue integrity Goals: Ulcer/skin breakdown will have a volume reduction of 80% by week 12 Date Initiated: 02/12/2018 Target Resolution Date: 05/15/2018 Goal Status: Active Interventions: Assess ulceration(s) every visit Treatment Activities: Patient referred to home care : 02/12/2018 Notes: Electronic Signature(s) Signed: 02/13/2018 8:47:48 AM By: Gretta Cool, BSN, RN, CWS, Kim RN, BSN Entered By: Gretta Cool, BSN, RN, CWS, Kim on 02/12/2018 15:17:23 [name] ([id number]) -------------------------------------------------------------------------------- Pain Assessment Details Patient Name: [name] Date of Service: 02/12/2018 2:15 PM Medical Record  Number: [id number] Patient Account Number: [id number] Date of Birth/Sex: [birth year] (84 y.o. F) Treating RN: Ahmed Prima Primary Care Trula Frede: Park Liter Other Clinician: Referring Darreld Hoffer: Park Liter Treating Charletha Dalpe/Extender: Tito Dine in Treatment: 0 Active Problems Location of Pain Severity and Description of Pain Patient Has Paino No Site Locations Pain Management and Medication Current Pain Management: Electronic Signature(s) Signed: 02/12/2018 3:54:16 PM By: Alric Quan Entered By: Alric Quan on 02/12/2018 14:31:05 [name] ([id number]) -------------------------------------------------------------------------------- Patient/Caregiver Education Details Patient Name: [name] Date of Service: 02/12/2018 2:15 PM Medical Record Number: [id number] Patient Account Number: [id number] Date of Birth/Gender: 1928-08-15 (84 y.o. F) Treating RN: Montey Hora Primary Care Physician: Park Liter Other Clinician: Referring Physician: Park Liter Treating Physician/Extender: Tito Dine in Treatment: 0 Education Assessment Education Provided To: Patient and Caregiver Education Topics Provided Venous: Handouts: Other: wrap precautions Methods: Explain/Verbal Responses: State content correctly Electronic Signature(s) Signed: 02/12/2018 3:50:46 PM By: Montey Hora Entered By: Montey Hora on 02/12/2018 15:50:25 [name] ([id number]) -------------------------------------------------------------------------------- Wound Assessment Details Patient Name: [name] Date of Service: 02/12/2018 2:15 PM Medical Record Number: [id number] Patient Account Number: [id number] Date of Birth/Sex: 1928-08-17 (84 y.o. F) Treating RN: Ahmed Prima Primary Care Syair Fricker: Park Liter Other Clinician: Referring Johnsie Moscoso: Park Liter Treating Arshiya Jakes/Extender: Tito Dine in Treatment:  0 Wound Status Wound Number: 1 Primary Trauma, Other Etiology: Wound Location: Left, Lateral Lower Leg Wound Open Wounding Event: Trauma Status: Date Acquired: 10/21/2017 Comorbid Cataracts, Glaucoma, Asthma, Chronic Weeks Of Treatment: 0 History: Obstructive Pulmonary Disease (COPD), Clustered Wound: No Arrhythmia, Hypertension Photos Photo Uploaded By: Alric Quan on 02/12/2018 15:43:20 Wound Measurements Length: (cm) 2 Width: (cm) 2.5 Depth: (cm) 0.1 Area: (cm) 3.927 Volume: (cm) 0.393 % Reduction in Area: -66.7% % Reduction in Volume: -66.5% Epithelialization:  None Tunneling: No Undermining: No Wound Description Full Thickness Without Exposed Support Classification: Structures Wound Margin: Flat and Intact Exudate Large Amount: Exudate Type: Serosanguineous Exudate Color: red, brown Foul Odor After Cleansing: No Slough/Fibrino Yes Wound Bed Granulation Amount: Small (1-33%) Exposed Structure Granulation Quality: Red Fascia Exposed: No Necrotic Amount: Large (67-100%) Fat Layer (Subcutaneous Tissue) Exposed: No Necrotic Quality: Adherent Slough Tendon Exposed: No Muscle Exposed: No Joint Exposed: No Bone Exposed: No Periwound Skin Texture Texture Color [name], [name] ([id number]) No Abnormalities Noted: No No Abnormalities Noted: No Moisture Temperature / Pain No Abnormalities Noted: No Temperature: No Abnormality Tenderness on Palpation: Yes Wound Preparation Ulcer Cleansing: Rinsed/Irrigated with Saline Topical Anesthetic Applied: Other: lidocaine 4%, Treatment Notes Wound #1 (Left, Lateral Lower Leg) 1. Cleansed with: Clean wound with Normal Saline 2. Anesthetic Topical Lidocaine 4% cream to wound bed prior to debridement 4. Dressing Applied: Iodoflex 5. Secondary Dressing Applied ABD Pad 7. Secured with 3 Layer Compression System - Left Lower Extremity Electronic Signature(s) Signed: 02/20/2018 4:30:05 PM By: Alric Quan Previous Signature: 02/12/2018 3:54:16 PM Version By: Alric Quan Entered By: Alric Quan on 02/19/2018 14:32:10 [name] ([id number]) -------------------------------------------------------------------------------- Vitals Details Patient Name: [name] Date of Service: 02/12/2018 2:15 PM Medical Record Number: [id number] Patient Account Number: [id number] Date of Birth/Sex: [birth year] (84 y.o. F) Treating RN: Ahmed Prima Primary Care Particia Strahm: Park Liter Other Clinician: Referring Henry Utsey: Park Liter Treating Evalisse Prajapati/Extender: Tito Dine in Treatment: 0 Vital Signs Time Taken: 14:31 Temperature (F): 98.0 Height (in): 60 Pulse (bpm): 75 Source: Stated Respiratory Rate (breaths/min): 16 Weight (lbs): 94.2 Blood Pressure (mmHg): 145/64 Source: Measured Reference Range: 80 - 120 mg / dl Body Mass Index (BMI): 18.4 Electronic Signature(s) Signed: 02/12/2018 3:54:16 PM By: Alric Quan Entered By: Alric Quan on 02/12/2018 14:31:33

## 2018-02-15 NOTE — Progress Notes (Signed)
[name], [name] ([id number]) Visit Report for 02/12/2018 Debridement Details Patient Name: [name], [name] Date of Service: 02/12/2018 2:15 PM Medical Record Number: [id number] Patient Account Number: [id number] Date of Birth/Sex: 15-Mar-1928 (84 y.o. F) Treating RN: [name] Barman Primary Care Provider: Park Liter Other Clinician: Referring Provider: Park Liter Treating Provider/Extender: Tito Dine in Treatment: 0 Debridement Performed for Wound #1 Left,Lateral Lower Leg Assessment: Performed By: Physician Ricard Dillon, MD Debridement Type: Debridement Pre-procedure Verification/Time Yes - 15:18 Out Taken: Start Time: 15:19 Pain Control: Other : lidocaine 4% Total Area Debrided (L x W): 2 (cm) x 1.5 (cm) = 3 (cm) Tissue and other material Viable, Non-Viable, Slough, Subcutaneous, Slough debrided: Level: Skin/Subcutaneous Tissue Debridement Description: Excisional Instrument: Curette Bleeding: Moderate Hemostasis Achieved: Pressure End Time: 15:21 Procedural Pain: 0 Post Procedural Pain: 0 Response to Treatment: Procedure was tolerated well Level of Consciousness: Awake and Alert Post Debridement Measurements of Total Wound Length: (cm) 2 Width: (cm) 1.5 Depth: (cm) 0.2 Volume: (cm) 0.471 Character of Wound/Ulcer Post Debridement: Stable Post Procedure Diagnosis Same as Pre-procedure Electronic Signature(s) Signed: 02/12/2018 4:49:36 PM By: Linton Ham MD Signed: 02/13/2018 8:47:48 AM By: Gretta Cool, BSN, RN, CWS, Kim RN, BSN Entered By: Linton Ham on 02/12/2018 15:55:38 [name], [name] ([id number]) -------------------------------------------------------------------------------- HPI Details Patient Name: [name] Date of Service: 02/12/2018 2:15 PM Medical Record Number: [id number] Patient Account Number: [id number] Date of Birth/Sex: 11-16-1927 (84 y.o. F) Treating RN: [name] Barman Primary Care Provider: Park Liter Other Clinician: Referring Provider: Park Liter Treating Provider/Extender: Tito Dine in Treatment: 0 History of Present Illness HPI Description: 02/12/18 ADMISSION This is an 84 year old woman who is recently moved to [place] from [place]. Her story began in February where she fell on some ice suffering extensive lacerations to her bilateral lower extremities. She is able to show me extensive pictures of the right lower leg but with going to a wound care center and [place] 3 times a week these eventually closed over. She has been left with 1 area on the upper lateral left calf. She has been applying Neosporin to this and applying a bandage. Currently this measures 2 x 1.5 cm. The patient is not a diabetic. She is an ex-smoker quitting 40 years ago. She does have COPD by description. ABIs in our clinic were 1.32 on the right and 1.03 on the left Electronic Signature(s) Signed: 02/12/2018 4:49:36 PM By: Linton Ham MD Entered By: Linton Ham on 02/12/2018 15:57:45 [name] ([id number]) -------------------------------------------------------------------------------- Physical Exam Details Patient Name: [name] Date of Service: 02/12/2018 2:15 PM Medical Record Number: [id number] Patient Account Number: [id number] Date of Birth/Sex: 1928-04-11 (84 y.o. F) Treating RN: [name] Barman Primary Care Provider: Park Liter Other Clinician: Referring Provider: Park Liter Treating Provider/Extender: Tito Dine in Treatment: 0 Constitutional Patient is hypertensive.. Pulse regular and within target range for patient.Marland Kitchen Respirations regular, non-labored and within target range.. Temperature is normal and within the target range for the patient.Marland Kitchen appears in no distress. Ears, Nose, Mouth, and Throat Patient is hard of hearing.she wears a hearing aid however there is no occlusion whining. Respiratory Respiratory effort is easy and  symmetric bilaterally. Rate is normal at rest and on room air.. Bilateral breath sounds are clear and equal in all lobes with no wheezes, rales or rhonchi.. Cardiovascular Heart rhythm and rate regular, without murmur or gallop.. Femoral arteries palpable. Pedal pulses palpable and strong bilaterally.. chronic hemosiderin deposition in her bilateral lower legs. Suggestive of some edema chronic venous insufficiency  and likely solar induced skin damage in the lower extremities. Gastrointestinal (GI) no masses are noted no tenderness.. Lymphatic none palpable in the popliteal or inguinal area. Psychiatric No evidence of depression, anxiety, or agitation. Calm, cooperative, and communicative. Appropriate interactions and affect.. Notes wound exam; the patient has significant skin damage in her bilateral lower extremities. There is no open wound in the right. On the left anterior lateral calf patient wound is covered with adherent necrotic debris. Using #5 curet I removed this much of this is possible although I don't believe I got down to a healthy surface there is clear fat layer exposed her here. No evidence of surrounding infection. There is no erythema no soft tissue crepitus. Electronic Signature(s) Signed: 02/12/2018 4:49:36 PM By: Linton Ham MD Entered By: Linton Ham on 02/12/2018 16:00:41 [name] ([id number]) -------------------------------------------------------------------------------- Physician Orders Details Patient Name: [name] Date of Service: 02/12/2018 2:15 PM Medical Record Number: [id number] Patient Account Number: [id number] Date of Birth/Sex: [birth year] (84 y.o. F) Treating RN: [name] Barman Primary Care Provider: Park Liter Other Clinician: Referring Provider: Park Liter Treating Provider/Extender: Tito Dine in Treatment: 0 Verbal / Phone Orders: No Diagnosis Coding Wound Cleansing Wound #1 Left,Lateral Lower Leg o  Cleanse wound with mild soap and water Anesthetic (add to Medication List) Wound #1 Left,Lateral Lower Leg o Topical Lidocaine 4% cream applied to wound bed prior to debridement (In Clinic Only). o Benzocaine Topical Anesthetic Spray applied to wound bed prior to debridement (In Clinic Only). Primary Wound Dressing Wound #1 Left,Lateral Lower Leg o Iodoflex Secondary Dressing Wound #1 Left,Lateral Lower Leg o ABD pad Dressing Change Frequency Wound #1 Left,Lateral Lower Leg o Change dressing every week Follow-up Appointments Wound #1 Left,Lateral Lower Leg o Return Appointment in 1 week. Edema Control Wound #1 Left,Lateral Lower Leg o 3 Layer Compression System - Left Lower Extremity Electronic Signature(s) Signed: 02/12/2018 4:49:36 PM By: Linton Ham MD Signed: 02/13/2018 8:47:48 AM By: Gretta Cool, BSN, RN, CWS, Kim RN, BSN Entered By: Gretta Cool, BSN, RN, CWS, Kim on 02/12/2018 15:22:29 ANNMARGARET, DECAPRIO ([id number]) -------------------------------------------------------------------------------- Problem List Details Patient Name: [name] Date of Service: 02/12/2018 2:15 PM Medical Record Number: [id number] Patient Account Number: [id number] Date of Birth/Sex: [birth year] (84 y.o. F) Treating RN: [name] Barman Primary Care Provider: Park Liter Other Clinician: Referring Provider: Park Liter Treating Provider/Extender: Tito Dine in Treatment: 0 Active Problems ICD-10 Impacting Encounter Code Description Active Date Wound Healing Diagnosis L97.222 Non-pressure chronic ulcer of left calf with fat layer exposed 02/12/2018 No Yes I87.311 Chronic venous hypertension (idiopathic) with ulcer of right 02/12/2018 No Yes lower extremity S81.812D Laceration without foreign body, left lower leg, subsequent 02/12/2018 No Yes encounter Inactive Problems Resolved Problems Electronic Signature(s) Signed: 02/12/2018 4:49:36 PM By: Linton Ham  MD Entered By: Linton Ham on 02/12/2018 15:53:20 [place], [name] ([id number]) -------------------------------------------------------------------------------- Progress Note Details Patient Name: [name] Date of Service: 02/12/2018 2:15 PM Medical Record Number: [id number] Patient Account Number: [id number] Date of Birth/Sex: [birth year] (84 y.o. F) Treating RN: [name] Barman Primary Care Provider: Park Liter Other Clinician: Referring Provider: Park Liter Treating Provider/Extender: Tito Dine in Treatment: 0 Subjective History of Present Illness (HPI) 02/12/18 ADMISSION This is an 84 year old woman who is recently moved to [place] from [place]. Her story began in February where she fell on some ice suffering extensive lacerations to her bilateral lower extremities. She is able to show me extensive pictures of the right lower leg but with going to a wound care  center and [place] 3 times a week these eventually closed over. She has been left with 1 area on the upper lateral left calf. She has been applying Neosporin to this and applying a bandage. Currently this measures 2 x 1.5 cm. The patient is not a diabetic. She is an ex-smoker quitting 40 years ago. She does have COPD by description. ABIs in our clinic were 1.32 on the right and 1.03 on the left Wound History Patient presents with 1 open wound that has been present for approximately feb 2019. Patient has been treating wound in the following manner: neosporin. Laboratory tests have not been performed in the last month. Patient reportedly has not tested positive for an antibiotic resistant organism. Patient reportedly has not tested positive for osteomyelitis. Patient reportedly has not had testing performed to evaluate circulation in the legs. Patient History Information obtained from Patient. Allergies NKDA Family History Cancer - Siblings,Paternal Grandparents, Diabetes - Siblings, Heart  Disease - Mother,Father,Child,Paternal Grandparents, Hypertension - Child, No family history of Hereditary Spherocytosis, Kidney Disease, Lung Disease, Seizures, Stroke, Thyroid Problems, Tuberculosis. Social History Former smoker - quit 40 yrs ago, Marital Status - Divorced, Alcohol Use - Never, Drug Use - No History, Caffeine Use - Rarely. Medical History Eyes Patient has history of Cataracts - surgery 2000, Glaucoma Respiratory Patient has history of Asthma, Chronic Obstructive Pulmonary Disease (COPD) Cardiovascular Patient has history of Arrhythmia - a-fib, Hypertension Endocrine Denies history of Type I Diabetes, Type II Diabetes Review of Systems (ROS) [name], [name] ([id number]) Constitutional Symptoms (General Health) The patient has no complaints or symptoms. Ear/Nose/Mouth/Throat [facility] Hematologic/Lymphatic The patient has no complaints or symptoms, dvt left ankle 30 yrs ago Gastrointestinal diverticulosis Endocrine Complains or has symptoms of Thyroid disease. Genitourinary Complains or has symptoms of Incontinence/dribbling. Immunological The patient has no complaints or symptoms. Integumentary (Skin) Complains or has symptoms of Wounds. Musculoskeletal degenerative bone disease Neurologic The patient has no complaints or symptoms. Oncologic The patient has no complaints or symptoms. Psychiatric Complains or has symptoms of Anxiety. Objective Constitutional Patient is hypertensive.. Pulse regular and within target range for patient.Marland Kitchen Respirations regular, non-labored and within target range.. Temperature is normal and within the target range for the patient.Marland Kitchen appears in no distress. Vitals Time Taken: 2:31 PM, Height: 60 in, Source: Stated, Weight: 94.2 lbs, Source: Measured, BMI: 18.4, Temperature: 98.0 F, Pulse: 75 bpm, Respiratory Rate: 16 breaths/min, Blood Pressure: 145/64 mmHg. Ears, Nose, Mouth, and Throat Patient is hard of hearing.she wears a  hearing aid however there is no occlusion whining. Respiratory Respiratory effort is easy and symmetric bilaterally. Rate is normal at rest and on room air.. Bilateral breath sounds are clear and equal in all lobes with no wheezes, rales or rhonchi.. Cardiovascular Heart rhythm and rate regular, without murmur or gallop.. Femoral arteries palpable. Pedal pulses palpable and strong bilaterally.. chronic hemosiderin deposition in her bilateral lower legs. Suggestive of some edema chronic venous insufficiency and likely solar induced skin damage in the lower extremities. Gastrointestinal (GI) no masses are noted no tenderness.. Lymphatic [name], [name] ([id number]) none palpable in the popliteal or inguinal area. Psychiatric No evidence of depression, anxiety, or agitation. Calm, cooperative, and communicative. Appropriate interactions and affect.. General Notes: wound exam; the patient has significant skin damage in her bilateral lower extremities. There is no open wound in the right. On the left anterior lateral calf patient wound is covered with adherent necrotic debris. Using #5 curet I removed this much of this is possible although I don't believe I  got down to a healthy surface there is clear fat layer exposed her here. No evidence of surrounding infection. There is no erythema no soft tissue crepitus. Integumentary (Hair, Skin) Wound #1 status is Open. Original cause of wound was Trauma. The wound is located on the Left,Lateral Lower Leg. The wound measures 2cm length x 1.5cm width x 0.1cm depth; 2.356cm^2 area and 0.236cm^3 volume. There is no tunneling or undermining noted. There is a large amount of serosanguineous drainage noted. The wound margin is flat and intact. There is small (1-33%) red granulation within the wound bed. There is a large (67-100%) amount of necrotic tissue within the wound bed including Adherent Slough. Periwound temperature was noted as No Abnormality. The  periwound has tenderness on palpation. Assessment Active Problems ICD-10 Non-pressure chronic ulcer of left calf with fat layer exposed Chronic venous hypertension (idiopathic) with ulcer of right lower extremity Laceration without foreign body, left lower leg, subsequent encounter Procedures Wound #1 Pre-procedure diagnosis of Wound #1 is a Trauma, Other located on the Left,Lateral Lower Leg . There was a Excisional Skin/Subcutaneous Tissue Debridement with a total area of 3 sq cm performed by Ricard Dillon, MD. With the following instrument(s): Curette to remove Viable and Non-Viable tissue/material. Material removed includes Subcutaneous Tissue and Slough and after achieving pain control using Other (lidocaine 4%). No specimens were taken. A time out was conducted at 15:18, prior to the start of the procedure. A Moderate amount of bleeding was controlled with Pressure. The procedure was tolerated well with a pain level of 0 throughout and a pain level of 0 following the procedure. Patient s Level of Consciousness post procedure was recorded as Awake and Alert. Post Debridement Measurements: 2cm length x 1.5cm width x 0.2cm depth; 0.471cm^3 volume. Character of Wound/Ulcer Post Debridement is stable. Post procedure Diagnosis Wound #1: Same as Pre-Procedure Plan Wound Cleansing: [name], [name] ([id number]) Wound #1 Left,Lateral Lower Leg: Cleanse wound with mild soap and water Anesthetic (add to Medication List): Wound #1 Left,Lateral Lower Leg: Topical Lidocaine 4% cream applied to wound bed prior to debridement (In Clinic Only). Benzocaine Topical Anesthetic Spray applied to wound bed prior to debridement (In Clinic Only). Primary Wound Dressing: Wound #1 Left,Lateral Lower Leg: Iodoflex Secondary Dressing: Wound #1 Left,Lateral Lower Leg: ABD pad Dressing Change Frequency: Wound #1 Left,Lateral Lower Leg: Change dressing every week Follow-up Appointments: Wound #1  Left,Lateral Lower Leg: Return Appointment in 1 week. Edema Control: Wound #1 Left,Lateral Lower Leg: 3 Layer Compression System - Left Lower Extremity #1 I'm not able to get to a surface that I consider completely viable. I'll use Iodoflex as the primary dressing here. There is surrounding edema she will require 3 layer compression #2 fortunately most of what I can see on her cell phone as healed especially on the right. #3 she is not homebound and therefore will not will not qualify for home care. We will attempt to leave this on all week. Explained this in detail to the patient and her daughter Electronic Signature(s) Signed: 02/12/2018 4:49:36 PM By: Linton Ham MD Entered By: Linton Ham on 02/12/2018 16:04:38 [name], [name] ([id number]) -------------------------------------------------------------------------------- ROS/PFSH Details Patient Name: [name] Date of Service: 02/12/2018 2:15 PM Medical Record Number: [id number] Patient Account Number: [id number] Date of Birth/Sex: 17-Aug-1928 (84 y.o. F) Treating RN: Ahmed Prima Primary Care Provider: Park Liter Other Clinician: Referring Provider: Park Liter Treating Provider/Extender: Tito Dine in Treatment: 0 Information Obtained From Patient Wound History Do you currently have one or  more open woundso Yes How many open wounds do you currently haveo 1 Approximately how long have you had your woundso feb 2019 How have you been treating your wound(s) until nowo neosporin Has your wound(s) ever healed and then re-openedo No Have you had any lab work done in the past montho No Have you tested positive for an antibiotic resistant organism (MRSA, VRE)o No Have you tested positive for osteomyelitis (bone infection)o No Have you had any tests for circulation on your legso No Endocrine Complaints and Symptoms: Positive for: Thyroid disease Medical History: Negative for: Type I Diabetes;  Type II Diabetes Genitourinary Complaints and Symptoms: Positive for: Incontinence/dribbling Integumentary (Skin) Complaints and Symptoms: Positive for: Wounds Psychiatric Complaints and Symptoms: Positive for: Anxiety Constitutional Symptoms (General Health) Complaints and Symptoms: No Complaints or Symptoms Eyes Medical History: Positive for: Cataracts - surgery 2000; Glaucoma Ear/Nose/Mouth/Throat Complaints and Symptoms: Review of System NotesKAMALEI, ROEDER ([id number]) HOH Hematologic/Lymphatic Complaints and Symptoms: No Complaints or Symptoms Complaints and Symptoms: Review of System Notes: dvt left ankle 30 yrs ago Respiratory Medical History: Positive for: Asthma; Chronic Obstructive Pulmonary Disease (COPD) Cardiovascular Medical History: Positive for: Arrhythmia - a-fib; Hypertension Gastrointestinal Complaints and Symptoms: Review of System Notes: diverticulosis Immunological Complaints and Symptoms: No Complaints or Symptoms Musculoskeletal Complaints and Symptoms: Review of System Notes: degenerative bone disease Neurologic Complaints and Symptoms: No Complaints or Symptoms Oncologic Complaints and Symptoms: No Complaints or Symptoms HBO Extended History Items Eyes: Eyes: Cataracts Glaucoma Immunizations Pneumococcal Vaccine: Received Pneumococcal Vaccination: Yes Implantable Devices Family and Social History [name], [name] ([id number]) Cancer: Yes - Siblings,Paternal Grandparents; Diabetes: Yes - Siblings; Heart Disease: Yes - Mother,Father,Child,Paternal Grandparents; Hereditary Spherocytosis: No; Hypertension: Yes - Child; Kidney Disease: No; Lung Disease: No; Seizures: No; Stroke: No; Thyroid Problems: No; Tuberculosis: No; Former smoker - quit 40 yrs ago; Marital Status - Divorced; Alcohol Use: Never; Drug Use: No History; Caffeine Use: Rarely; Financial Concerns: No; Food, Clothing or Shelter Needs: No; Support System Lacking:  No; Transportation Concerns: No; Advanced Directives: No; Patient does not want information on Advanced Directives; Do not resuscitate: No; Living Will: Yes (Not Provided); Medical Power of Attorney: Yes - [name] (daughter) (Not Provided) Electronic Signature(s) Signed: 02/12/2018 3:54:16 PM By: Alric Quan Signed: 02/12/2018 4:49:36 PM By: Linton Ham MD Entered By: Alric Quan on 02/12/2018 14:42:05 [place], [name] ([id number]) -------------------------------------------------------------------------------- SuperBill Details Patient Name: [name] Date of Service: 02/12/2018 Medical Record Number: [id number] Patient Account Number: [id number] Date of Birth/Sex: Jul 25, 1928 (84 y.o. F) Treating RN: [name] Barman Primary Care Provider: Park Liter Other Clinician: Referring Provider: Park Liter Treating Provider/Extender: Tito Dine in Treatment: 0 Diagnosis Coding ICD-10 Codes Code Description [phone] Non-pressure chronic ulcer of left calf with fat layer exposed I87.311 Chronic venous hypertension (idiopathic) with ulcer of right lower extremity S81.812D Laceration without foreign body, left lower leg, subsequent encounter Facility Procedures CPT4 Code: 38182993 Description: 99213 - WOUND CARE VISIT-LEV 3 EST PT Modifier: Quantity: 1 CPT4 Code: [id number] Description: 11042 - DEB SUBQ TISSUE 20 SQ CM/< ICD-10 Diagnosis Description L97.222 Non-pressure chronic ulcer of left calf with fat layer exposed Modifier: Quantity: 1 Physician Procedures CPT4 Code: 3810175 Description: WC PHYS LEVEL 3 o NEW PT ICD-10 Diagnosis Description L97.222 Non-pressure chronic ulcer of left calf with fat layer exposed I87.311 Chronic venous hypertension (idiopathic) with ulcer of right lowe S81.812D Laceration without foreign body,  left lower leg, subsequent encou Modifier: 25 r extremity nter Quantity: 1 CPT4 Code: [id number] Description: 11042 - WC PHYS  SUBQ TISS 20 SQ CM ICD-10 Diagnosis Description L97.222 Non-pressure chronic ulcer of left calf with fat layer exposed Modifier: Quantity: 1 Electronic Signature(s) Signed: 02/12/2018 4:49:36 PM By: Linton Ham MD Entered By: Linton Ham on 02/12/2018 16:05:51

## 2018-02-19 ENCOUNTER — Encounter: Payer: Medicare (Managed Care) | Admitting: Internal Medicine

## 2018-02-19 DIAGNOSIS — I872 Venous insufficiency (chronic) (peripheral): Secondary | ICD-10-CM | POA: Diagnosis not present

## 2018-02-21 NOTE — Progress Notes (Signed)
PAGE, PUCCIARELLI ([id number]) Visit Report for 02/19/2018 Debridement Details Patient Name: CADEY, BAZILE Date of Service: 02/19/2018 2:30 PM Medical Record Number: [id number] Patient Account Number: [id number] Date of Birth/Sex: 05-22-1928 (84 y.o. F) Treating RN: [name] Barman Primary Care Provider: Park Liter Other Clinician: Referring Provider: Park Liter Treating Provider/Extender: Tito Dine in Treatment: 1 Debridement Performed for Wound #1 Left,Lateral Lower Leg Assessment: Performed By: Physician Ricard Dillon, MD Debridement Type: Debridement Pre-procedure Verification/Time Yes - 14:36 Out Taken: Start Time: 14:37 Pain Control: Other : lidociane 4% Total Area Debrided (L x W): 2 (cm) x 2.3 (cm) = 4.6 (cm) Tissue and other material Viable, Non-Viable, Slough, Subcutaneous, Slough debrided: Level: Skin/Subcutaneous Tissue Debridement Description: Excisional Instrument: Curette Bleeding: Minimum Hemostasis Achieved: Pressure Response to Treatment: Procedure was tolerated well Level of Consciousness: Awake and Alert Post Debridement Measurements of Total Wound Length: (cm) 2 Width: (cm) 2.3 Depth: (cm) 0.2 Volume: (cm) 0.723 Character of Wound/Ulcer Post Debridement: Stable Post Procedure Diagnosis Same as Pre-procedure Electronic Signature(s) Signed: 02/19/2018 4:07:49 PM By: Linton Ham MD Signed: 02/19/2018 4:19:21 PM By: Gretta Cool, BSN, RN, CWS, Kim RN, BSN Entered By: Linton Ham on 02/19/2018 15:33:59 [name] ([id number]) -------------------------------------------------------------------------------- HPI Details Patient Name: [name] Date of Service: 02/19/2018 2:30 PM Medical Record Number: [id number] Patient Account Number: [id number] Date of Birth/Sex: 1927/09/15 (84 y.o. F) Treating RN: [name] Barman Primary Care Provider: Park Liter Other Clinician: Referring Provider: Park Liter Treating  Provider/Extender: Tito Dine in Treatment: 1 History of Present Illness HPI Description: 02/12/18 ADMISSION This is an 84 year old woman who is recently moved to [place] from [place]. Her story began in February where she fell on some ice suffering extensive lacerations to her bilateral lower extremities. She is able to show me extensive pictures of the right lower leg but with going to a wound care center and [place] 3 times a week these eventually closed over. She has been left with 1 area on the upper lateral left calf. She has been applying Neosporin to this and applying a bandage. Currently this measures 2 x 1.5 cm. The patient is not a diabetic. She is an ex-smoker quitting 40 years ago. She does have COPD by description. ABIs in our clinic were 1.32 on the right and 1.03 on the left 02/19/18; right lower leg wound which was initially trauma. The area that we look that last week is smaller. Still covered in a nonviable surface however. We are using Iodoflex Electronic Signature(s) Signed: 02/19/2018 4:07:49 PM By: Linton Ham MD Entered By: Linton Ham on 02/19/2018 15:34:53 ASHTYNN, BERKE ([id number]) -------------------------------------------------------------------------------- Physical Exam Details Patient Name: [name] Date of Service: 02/19/2018 2:30 PM Medical Record Number: [id number] Patient Account Number: [id number] Date of Birth/Sex: [birth year] (84 y.o. F) Treating RN: [name] Barman Primary Care Provider: Park Liter Other Clinician: Referring Provider: Park Liter Treating Provider/Extender: Tito Dine in Treatment: 1 Constitutional Patient is hypertensive.. Pulse regular and within target range for patient.Marland Kitchen Respirations regular, non-labored and within target range.. Temperature is normal and within the target range for the patient.Marland Kitchen appears in no distress. Notes wound exam; the patient has a smaller wound on the  left lateral calf. Still covered in a tightly adherent necrotic surface. Using a #5 curet I was successful in removing most of this. Hemostasis with direct pressure. Much better-looking surface today. There is no erythema, no soft tissue crepitus Electronic Signature(s) Signed: 02/19/2018 4:07:49 PM By: Linton Ham MD Entered By: Linton Ham on 02/19/2018 15:37:11  LESLEYANNE, POLITTE ([id number]) -------------------------------------------------------------------------------- Physician Orders Details Patient Name: SHALEEN, TALAMANTEZ Date of Service: 02/19/2018 2:30 PM Medical Record Number: [id number] Patient Account Number: [id number] Date of Birth/Sex: 1928-03-25 (84 y.o. F) Treating RN: [name] Barman Primary Care Provider: Park Liter Other Clinician: Referring Provider: Park Liter Treating Provider/Extender: Tito Dine in Treatment: 1 Verbal / Phone Orders: No Diagnosis Coding Wound Cleansing Wound #1 Left,Lateral Lower Leg o Cleanse wound with mild soap and water Anesthetic (add to Medication List) Wound #1 Left,Lateral Lower Leg o Topical Lidocaine 4% cream applied to wound bed prior to debridement (In Clinic Only). o Benzocaine Topical Anesthetic Spray applied to wound bed prior to debridement (In Clinic Only). Primary Wound Dressing Wound #1 Left,Lateral Lower Leg o Iodoflex Secondary Dressing Wound #1 Left,Lateral Lower Leg o ABD pad Dressing Change Frequency Wound #1 Left,Lateral Lower Leg o Change dressing every week Follow-up Appointments Wound #1 Left,Lateral Lower Leg o Return Appointment in 1 week. Edema Control Wound #1 Left,Lateral Lower Leg o 3 Layer Compression System - Left Lower Extremity Electronic Signature(s) Signed: 02/19/2018 4:07:49 PM By: Linton Ham MD Signed: 02/19/2018 4:19:21 PM By: Gretta Cool, BSN, RN, CWS, Kim RN, BSN Entered By: Gretta Cool, BSN, RN, CWS, Kim on 02/19/2018 14:38:14 OLUWATOYIN, BANALES  ([id number]) -------------------------------------------------------------------------------- Problem List Details Patient Name: [name] Date of Service: 02/19/2018 2:30 PM Medical Record Number: [id number] Patient Account Number: [id number] Date of Birth/Sex: [birth year] (84 y.o. F) Treating RN: [name] Barman Primary Care Provider: Park Liter Other Clinician: Referring Provider: Park Liter Treating Provider/Extender: Tito Dine in Treatment: 1 Active Problems ICD-10 Impacting Encounter Code Description Active Date Wound Healing Diagnosis L97.222 Non-pressure chronic ulcer of left calf with fat layer exposed 02/12/2018 No Yes I87.311 Chronic venous hypertension (idiopathic) with ulcer of right 02/12/2018 No Yes lower extremity S81.812D Laceration without foreign body, left lower leg, subsequent 02/12/2018 No Yes encounter Inactive Problems Resolved Problems Electronic Signature(s) Signed: 02/19/2018 4:07:49 PM By: Linton Ham MD Entered By: Linton Ham on 02/19/2018 15:32:55 [name] ([id number]) -------------------------------------------------------------------------------- Progress Note Details Patient Name: [name] Date of Service: 02/19/2018 2:30 PM Medical Record Number: [id number] Patient Account Number: [id number] Date of Birth/Sex: 17-Oct-1927 (84 y.o. F) Treating RN: [name] Barman Primary Care Provider: Park Liter Other Clinician: Referring Provider: Park Liter Treating Provider/Extender: Tito Dine in Treatment: 1 Subjective History of Present Illness (HPI) 02/12/18 ADMISSION This is an 84 year old woman who is recently moved to [place] from [employer]. Her story began in February where she fell on some ice suffering extensive lacerations to her bilateral lower extremities. She is able to show me extensive pictures of the right lower leg but with going to a wound care center and [place] 3 times a  week these eventually closed over. She has been left with 1 area on the upper lateral left calf. She has been applying Neosporin to this and applying a bandage. Currently this measures 2 x 1.5 cm. The patient is not a diabetic. She is an ex-smoker quitting 40 years ago. She does have COPD by description. ABIs in our clinic were 1.32 on the right and 1.03 on the left 02/19/18; right lower leg wound which was initially trauma. The area that we look that last week is smaller. Still covered in a nonviable surface however. We are using Iodoflex Objective Constitutional Patient is hypertensive.. Pulse regular and within target range for patient.Marland Kitchen Respirations regular, non-labored and within target range.. Temperature is normal and within the target range for the patient.Marland Kitchen appears in  no distress. Vitals Time Taken: 2:20 PM, Height: 60 in, Weight: 94.2 lbs, BMI: 18.4, Temperature: 98.0 F, Pulse: 60 bpm, Respiratory Rate: 16 breaths/min, Blood Pressure: 146/55 mmHg. General Notes: wound exam; the patient has a smaller wound on the left lateral calf. Still covered in a tightly adherent necrotic surface. Using a #5 curet I was successful in removing most of this. Hemostasis with direct pressure. Much better-looking surface today. There is no erythema, no soft tissue crepitus Integumentary (Hair, Skin) Wound #1 status is Open. Original cause of wound was Trauma. The wound is located on the Left,Lateral Lower Leg. The wound measures 2cm length x 2.3cm width x 0.1cm depth; 3.613cm^2 area and 0.361cm^3 volume. There is no tunneling or undermining noted. There is a large amount of sanguinous drainage noted. The wound margin is flat and intact. There is medium (34-66%) red granulation within the wound bed. There is a medium (34-66%) amount of necrotic tissue within the wound bed including Adherent Slough. Periwound temperature was noted as No Abnormality. The periwound has tenderness on palpation. MARRY, KUSCH ([id number]) Assessment Active Problems ICD-10 Non-pressure chronic ulcer of left calf with fat layer exposed Chronic venous hypertension (idiopathic) with ulcer of right lower extremity Laceration without foreign body, left lower leg, subsequent encounter Procedures Wound #1 Pre-procedure diagnosis of Wound #1 is a Trauma, Other located on the Left,Lateral Lower Leg . There was a Excisional Skin/Subcutaneous Tissue Debridement with a total area of 4.6 sq cm performed by Ricard Dillon, MD. With the following instrument(s): Curette to remove Viable and Non-Viable tissue/material. Material removed includes Subcutaneous Tissue and Slough and after achieving pain control using Other (lidociane 4%). No specimens were taken. A time out was conducted at 14:36, prior to the start of the procedure. A Minimum amount of bleeding was controlled with Pressure. The procedure was tolerated well. Patient s Level of Consciousness post procedure was recorded as Awake and Alert. Post Debridement Measurements: 2cm length x 2.3cm width x 0.2cm depth; 0.723cm^3 volume. Character of Wound/Ulcer Post Debridement is stable. Post procedure Diagnosis Wound #1: Same as Pre-Procedure Plan Wound Cleansing: Wound #1 Left,Lateral Lower Leg: Cleanse wound with mild soap and water Anesthetic (add to Medication List): Wound #1 Left,Lateral Lower Leg: Topical Lidocaine 4% cream applied to wound bed prior to debridement (In Clinic Only). Benzocaine Topical Anesthetic Spray applied to wound bed prior to debridement (In Clinic Only). Primary Wound Dressing: Wound #1 Left,Lateral Lower Leg: Iodoflex Secondary Dressing: Wound #1 Left,Lateral Lower Leg: ABD pad Dressing Change Frequency: Wound #1 Left,Lateral Lower Leg: Change dressing every week Follow-up Appointments: Wound #1 Left,Lateral Lower Leg: Return Appointment in 1 week. Edema Control: Wound #1 Left,Lateral Lower Leg: 3 Layer Compression System  - Left Lower Extremity Blumenthal, Doyle ([id number]) #1 I'm going to continue with the Iodoflex/ABDs under 3 layer compression. #2 she has a better-looking wound surface today may be able to change to something else next week Electronic Signature(s) Signed: 02/19/2018 4:07:49 PM By: Linton Ham MD Entered By: Linton Ham on 02/19/2018 15:38:32 [name] ([id number]) -------------------------------------------------------------------------------- SuperBill Details Patient Name: [name] Date of Service: 02/19/2018 Medical Record Number: [id number] Patient Account Number: [id number] Date of Birth/Sex: Oct 01, 1927 (84 y.o. F) Treating RN: [name] Barman Primary Care Provider: Park Liter Other Clinician: Referring Provider: Park Liter Treating Provider/Extender: Tito Dine in Treatment: 1 Diagnosis Coding ICD-10 Codes Code Description [phone] Non-pressure chronic ulcer of left calf with fat layer exposed I87.311 Chronic venous hypertension (idiopathic) with ulcer of right lower extremity  S81.812D Laceration without foreign body, left lower leg, subsequent encounter Facility Procedures CPT4 Code: [id number] Description: 65790 - DEB SUBQ TISSUE 20 SQ CM/< ICD-10 Diagnosis Description L97.222 Non-pressure chronic ulcer of left calf with fat layer exposed I87.311 Chronic venous hypertension (idiopathic) with ulcer of right l Modifier: ower extremity Quantity: 1 Physician Procedures CPT4 Code: [id number] Description: 29191 - WC PHYS SUBQ TISS 20 SQ CM ICD-10 Diagnosis Description L97.222 Non-pressure chronic ulcer of left calf with fat layer exposed I87.311 Chronic venous hypertension (idiopathic) with ulcer of right l Modifier: ower extremity Quantity: 1 Electronic Signature(s) Signed: 02/19/2018 4:07:49 PM By: Linton Ham MD Entered By: Linton Ham on 02/19/2018 15:38:52

## 2018-02-22 NOTE — Progress Notes (Signed)
YITZEL, SHASTEEN ([id number]) Visit Report for 02/19/2018 Arrival Information Details Patient Name: NARCISSA, MELDER Date of Service: 02/19/2018 2:30 PM Medical Record Number: [id number] Patient Account Number: [id number] Date of Birth/Sex: May 04, 1928 (84 y.o. F) Treating RN: Ahmed Prima Primary Care Sharlee Rufino: Park Liter Other Clinician: Referring Jahking Lesser: Park Liter Treating Leva Baine/Extender: Tito Dine in Treatment: 1 Visit Information History Since Last Visit All ordered tests and consults were completed: No Patient Arrived: Ambulatory Added or deleted any medications: No Arrival Time: 14:16 Any new allergies or adverse reactions: No Accompanied By: daughter Had a fall or experienced change in No Transfer Assistance: None activities of daily living that may affect Patient Identification Verified: Yes risk of falls: Secondary Verification Process Completed: Yes Signs or symptoms of abuse/neglect since last visito No Patient Requires Transmission-Based No Hospitalized since last visit: No Precautions: Implantable device outside of the clinic excluding No Patient Has Alerts: No cellular tissue based products placed in the center since last visit: Has Dressing in Place as Prescribed: Yes Pain Present Now: No Electronic Signature(s) Signed: 02/20/2018 4:29:22 PM By: Alric Quan Entered By: Alric Quan on 02/19/2018 14:17:00 [name] ([id number]) -------------------------------------------------------------------------------- Encounter Discharge Information Details Patient Name: [name] Date of Service: 02/19/2018 2:30 PM Medical Record Number: [id number] Patient Account Number: [id number] Date of Birth/Sex: [birth year] (84 y.o. F) Treating RN: Montey Hora Primary Care Mccade Sullenberger: Park Liter Other Clinician: Referring Savita Runner: Park Liter Treating Renesha Lizama/Extender: Tito Dine in Treatment:  1 Encounter Discharge Information Items Discharge Condition: Stable Ambulatory Status: Cane Discharge Destination: Home Transportation: Private Auto Accompanied By: dtr Schedule Follow-up Appointment: Yes Clinical Summary of Care: Electronic Signature(s) Signed: 02/19/2018 3:44:42 PM By: Montey Hora Entered By: Montey Hora on 02/19/2018 15:44:41 [place], Joycelyn Schmid ([id number]) -------------------------------------------------------------------------------- Lower Extremity Assessment Details Patient Name: [name] Date of Service: 02/19/2018 2:30 PM Medical Record Number: [id number] Patient Account Number: [id number] Date of Birth/Sex: [birth year] (84 y.o. F) Treating RN: Ahmed Prima Primary Care Graeden Bitner: Park Liter Other Clinician: Referring Kady Toothaker: Park Liter Treating Celestia Duva/Extender: Tito Dine in Treatment: 1 Edema Assessment Assessed: [Left: No] [Right: No] E[Left: dema] [Right: :] Calf Left: Right: Point of Measurement: 32 cm From Medial Instep 30 cm cm Ankle Left: Right: Point of Measurement: 9 cm From Medial Instep 17.8 cm cm Vascular Assessment Pulses: Dorsalis Pedis Palpable: [Left:Yes] Posterior Tibial Extremity colors, hair growth, and conditions: Extremity Color: [Left:Hyperpigmented] Temperature of Extremity: [Left:Warm] Capillary Refill: [Left:> 3 seconds] Toe Nail Assessment Left: Right: Thick: Yes Discolored: Yes Deformed: No Improper Length and Hygiene: Yes Electronic Signature(s) Signed: 02/20/2018 4:29:22 PM By: Alric Quan Entered By: Alric Quan on 02/19/2018 14:24:35 [place], Joycelyn Schmid ([id number]) -------------------------------------------------------------------------------- Multi Wound Chart Details Patient Name: [name] Date of Service: 02/19/2018 2:30 PM Medical Record Number: [id number] Patient Account Number: [id number] Date of Birth/Sex: [birth year] (84 y.o. F) Treating RN:  [name] Barman Primary Care [place]more List: Park Liter Other Clinician: Referring Leonora Gores: Park Liter Treating Ramello Cordial/Extender: Tito Dine in Treatment: 1 Vital Signs Height(in): 60 Pulse(bpm): 60 Weight(lbs): 94.2 Blood Pressure(mmHg): 146/55 Body Mass Index(BMI): 18 Temperature(F): 98.0 Respiratory Rate 16 (breaths/min): Photos: [1:No Photos] [N/A:N/A] Wound Location: [1:Left Lower Leg - Lateral] [N/A:N/A] Wounding Event: [1:Trauma] [N/A:N/A] Primary Etiology: [1:Trauma, Other] [N/A:N/A] Comorbid History: [1:Cataracts, Glaucoma, Asthma, N/A Chronic Obstructive Pulmonary Disease (COPD), Arrhythmia, Hypertension] Date Acquired: [1:10/21/2017] [N/A:N/A] Weeks of Treatment: [1:1] [N/A:N/A] Wound Status: [1:Open] [N/A:N/A] Measurements L x W x D [1:2x2.3x0.1] [N/A:N/A] (cm) Area (cm) : [1:3.613] [N/A:N/A] Volume (cm) : [1:0.361] [N/A:N/A] % Reduction in Area: [1:-53.40%] [  N/A:N/A] % Reduction in Volume: [1:-53.00%] [N/A:N/A] Classification: [1:Full Thickness Without Exposed Support Structures] [N/A:N/A] Exudate Amount: [1:Large] [N/A:N/A] Exudate Type: [1:Sanguinous] [N/A:N/A] Exudate Color: [1:red] [N/A:N/A] Wound Margin: [1:Flat and Intact] [N/A:N/A] Granulation Amount: [1:Medium (34-66%)] [N/A:N/A] Granulation Quality: [1:Red] [N/A:N/A] Necrotic Amount: [1:Medium (34-66%)] [N/A:N/A] Exposed Structures: [1:Fascia: No Fat Layer (Subcutaneous Tissue) Exposed: No Tendon: No Muscle: No Joint: No Bone: No] [N/A:N/A] Epithelialization: [1:None] [N/A:N/A] Debridement: [1:Debridement - Excisional] [N/A:N/A] Pre-procedure [1:14:36] [N/A:N/A] Verification/Time Out Taken: TYANNE, DEROCHER ([id number]) Pain Control: Other N/A N/A Tissue Debrided: Subcutaneous, Slough N/A N/A Level: Skin/Subcutaneous Tissue N/A N/A Debridement Area (sq cm): 4.6 N/A N/A Instrument: Curette N/A N/A Bleeding: Minimum N/A N/A Hemostasis Achieved: Pressure N/A N/A Debridement  Treatment Procedure was tolerated well N/A N/A Response: Post Debridement 2x2.3x0.2 N/A N/A Measurements L x W x D (cm) Post Debridement Volume: 0.723 N/A N/A (cm) Periwound Skin Texture: No Abnormalities Noted N/A N/A Periwound Skin Moisture: No Abnormalities Noted N/A N/A Periwound Skin Color: No Abnormalities Noted N/A N/A Temperature: No Abnormality N/A N/A Tenderness on Palpation: Yes N/A N/A Wound Preparation: Ulcer Cleansing: N/A N/A Rinsed/Irrigated with Saline, Other: soap and water Topical Anesthetic Applied: Other: lidocaine 4% Procedures Performed: Debridement N/A N/A Treatment Notes Electronic Signature(s) Signed: 02/19/2018 4:07:49 PM By: Linton Ham MD Entered By: Linton Ham on 02/19/2018 15:33:02 KALLISTA, PAE ([id number]) -------------------------------------------------------------------------------- Multi-Disciplinary Care Plan Details Patient Name: [name] Date of Service: 02/19/2018 2:30 PM Medical Record Number: [id number] Patient Account Number: [id number] Date of Birth/Sex: 15-Oct-1927 (84 y.o. F) Treating RN: [name] Barman Primary Care Bernyce Brimley: Park Liter Other Clinician: Referring Emmanuel Ercole: Park Liter Treating Brookley Spitler/Extender: Tito Dine in Treatment: 1 Active Inactive ` Necrotic Tissue Nursing Diagnoses: Impaired tissue integrity related to necrotic/devitalized tissue Goals: Necrotic/devitalized tissue will be minimized in the wound bed Date Initiated: 02/12/2018 Target Resolution Date: 03/07/2018 Goal Status: Active Interventions: Provide education on necrotic tissue and debridement process Treatment Activities: Apply topical anesthetic as ordered : 02/12/2018 Notes: ` Orientation to the Wound Care Program Nursing Diagnoses: Knowledge deficit related to the wound healing center program Goals: Patient/caregiver will verbalize understanding of the [place] Date Initiated:  02/12/2018 Target Resolution Date: 03/07/2018 Goal Status: Active Interventions: Provide education on orientation to the wound center Notes: ` Pain, Acute or Chronic Nursing Diagnoses: Potential alteration in comfort, pain Goals: Patient will verbalize adequate pain control and receive pain control interventions during procedures as needed Date Initiated: 02/12/2018 Target Resolution Date: 03/07/2018 DANYETTA, GILLHAM ([id number]) Goal Status: Active Interventions: Reposition patient for comfort Notes: ` Wound/Skin Impairment Nursing Diagnoses: Impaired tissue integrity Goals: Ulcer/skin breakdown will have a volume reduction of 80% by week 12 Date Initiated: 02/12/2018 Target Resolution Date: 05/15/2018 Goal Status: Active Interventions: Assess ulceration(s) every visit Treatment Activities: Patient referred to home care : 02/12/2018 Notes: Electronic Signature(s) Signed: 02/19/2018 4:19:21 PM By: Gretta Cool, BSN, RN, CWS, Kim RN, BSN Entered By: Gretta Cool, BSN, RN, CWS, Kim on 02/19/2018 14:35:13 [name] ([id number]) -------------------------------------------------------------------------------- Pain Assessment Details Patient Name: [name] Date of Service: 02/19/2018 2:30 PM Medical Record Number: [id number] Patient Account Number: [id number] Date of Birth/Sex: 01-21-1928 (84 y.o. F) Treating RN: Ahmed Prima Primary Care Wesly Whisenant: Park Liter Other Clinician: Referring Jeriyah Granlund: Park Liter Treating Frederich Montilla/Extender: Tito Dine in Treatment: 1 Active Problems Location of Pain Severity and Description of Pain Patient Has Paino No Site Locations Pain Management and Medication Current Pain Management: Electronic Signature(s) Signed: 02/20/2018 4:29:22 PM By: Alric Quan Entered By: Alric Quan on 02/19/2018 14:17:06 Ritsema, Adisynn  (  [id number]) -------------------------------------------------------------------------------- Patient/Caregiver Education Details Patient Name: [name] Date of Service: 02/19/2018 2:30 PM Medical Record Number: [id number] Patient Account Number: [id number] Date of Birth/Gender: 28-Oct-1927 (84 y.o. F) Treating RN: Montey Hora Primary Care Physician: Park Liter Other Clinician: Referring Physician: Park Liter Treating Physician/Extender: Tito Dine in Treatment: 1 Education Assessment Education Provided To: Patient and Caregiver Education Topics Provided Venous: Handouts: Other: leg elevation Methods: Explain/Verbal Responses: State content correctly Electronic Signature(s) Signed: 02/19/2018 3:47:23 PM By: Montey Hora Entered By: Montey Hora on 02/19/2018 15:45:03 [place], Joycelyn Schmid ([id number]) -------------------------------------------------------------------------------- Wound Assessment Details Patient Name: [name] Date of Service: 02/19/2018 2:30 PM Medical Record Number: [id number] Patient Account Number: [id number] Date of Birth/Sex: [birth year] (84 y.o. F) Treating RN: Ahmed Prima Primary Care Alano Blasco: Park Liter Other Clinician: Referring Calah Gershman: Park Liter Treating Zayd Bonet/Extender: Tito Dine in Treatment: 1 Wound Status Wound Number: 1 Primary Trauma, Other Etiology: Wound Location: Left Lower Leg - Lateral Wound Open Wounding Event: Trauma Status: Date Acquired: 10/21/2017 Comorbid Cataracts, Glaucoma, Asthma, Chronic Weeks Of Treatment: 1 History: Obstructive Pulmonary Disease (COPD), Clustered Wound: No Arrhythmia, Hypertension Photos Photo Uploaded By: Alric Quan on 02/20/2018 16:40:24 Wound Measurements Length: (cm) 2 Width: (cm) 2.3 Depth: (cm) 0.1 Area: (cm) 3.613 Volume: (cm) 0.361 % Reduction in Area: -53.4% % Reduction in Volume: -53% Epithelialization:  None Tunneling: No Undermining: No Wound Description Full Thickness Without Exposed Support Classification: Structures Wound Margin: Flat and Intact Exudate Large Amount: Exudate Type: Sanguinous Exudate Color: red Foul Odor After Cleansing: No Slough/Fibrino Yes Wound Bed Granulation Amount: Medium (34-66%) Exposed Structure Granulation Quality: Red Fascia Exposed: No Necrotic Amount: Medium (34-66%) Fat Layer (Subcutaneous Tissue) Exposed: No Necrotic Quality: Adherent Slough Tendon Exposed: No Muscle Exposed: No Joint Exposed: No Bone Exposed: No Periwound Skin Texture Texture Color [place], Eustacia ([id number]) No Abnormalities Noted: No No Abnormalities Noted: No Moisture Temperature / Pain No Abnormalities Noted: No Temperature: No Abnormality Tenderness on Palpation: Yes Wound Preparation Ulcer Cleansing: Rinsed/Irrigated with Saline, Other: soap and water, Topical Anesthetic Applied: Other: lidocaine 4%, Treatment Notes Wound #1 (Left, Lateral Lower Leg) 1. Cleansed with: Cleanse wound with antibacterial soap and water 2. Anesthetic Topical Lidocaine 4% cream to wound bed prior to debridement 4. Dressing Applied: Iodoflex 5. Secondary Dressing Applied ABD Pad 7. Secured with 3 Layer Compression System - Left Lower Extremity Electronic Signature(s) Signed: 02/20/2018 4:29:22 PM By: Alric Quan Entered By: Alric Quan on 02/19/2018 14:30:44 [place], Joycelyn Schmid ([id number]) -------------------------------------------------------------------------------- Vitals Details Patient Name: [name] Date of Service: 02/19/2018 2:30 PM Medical Record Number: [id number] Patient Account Number: [id number] Date of Birth/Sex: [birth year] (84 y.o. F) Treating RN: Ahmed Prima Primary Care Bray Vickerman: Park Liter Other Clinician: Referring Hazel Wrinkle: Park Liter Treating Colen Eltzroth/Extender: Tito Dine in Treatment: 1 Vital  Signs Time Taken: 14:20 Temperature (F): 98.0 Height (in): 60 Pulse (bpm): 60 Weight (lbs): 94.2 Respiratory Rate (breaths/min): 16 Body Mass Index (BMI): 18.4 Blood Pressure (mmHg): 146/55 Reference Range: 80 - 120 mg / dl Electronic Signature(s) Signed: 02/20/2018 4:29:22 PM By: Alric Quan Entered By: Alric Quan on 02/19/2018 14:21:15

## 2018-02-26 ENCOUNTER — Encounter: Payer: Medicare (Managed Care) | Admitting: Internal Medicine

## 2018-02-26 DIAGNOSIS — I872 Venous insufficiency (chronic) (peripheral): Secondary | ICD-10-CM | POA: Diagnosis not present

## 2018-02-28 NOTE — Progress Notes (Signed)
[name], [name] ([id number]) Visit Report for 02/26/2018 HPI Details Patient Name: [name], [name] Date of Service: 02/26/2018 1:15 PM Medical Record Number: [id number] Patient Account Number: [id number] Date of Birth/Sex: 06/14/1928 (84 y.o. F) Treating RN: [name] Barman Primary [name] Provider: Park Liter Other Clinician: Referring Provider: Park Liter Treating Provider/Extender: Tito Dine in Treatment: 2 History of Present Illness HPI Description: 02/12/18 ADMISSION This is an 84 year old woman who is recently moved to [place] from [place]. Her story began in February where she fell on some ice suffering extensive lacerations to her bilateral lower extremities. She is able to show me extensive pictures of the right lower leg but with going to a wound [name] center and [place] 3 times a week these eventually closed over. She has been left with 1 area on the upper lateral left calf. She has been applying Neosporin to this and applying a bandage. Currently this measures 2 x 1.5 cm. The patient is not a diabetic. She is an ex-smoker quitting 40 years ago. She does have COPD by description. ABIs in our clinic were 1.32 on the right and 1.03 on the left 02/19/18; right lower leg wound which was initially trauma. The area that we look that last week is smaller. Still covered in a nonviable surface however. We are using Iodoflex 02/26/18; right lower leg wound which was initially trauma in the setting of chronic venous insufficiency. Surface of the wound looks much better healthy granulation advancing epithelialization. We have good edema control Electronic Signature(s) Signed: 02/26/2018 6:35:58 PM By: Linton Ham MD Entered By: Linton Ham on 02/26/2018 18:31:36 [name], [name] ([id number]) -------------------------------------------------------------------------------- Physical Exam Details Patient Name: [name] Date of Service: 02/26/2018 1:15  PM Medical Record Number: [id number] Patient Account Number: [id number] Date of Birth/Sex: [birth year] (84 y.o. F) Treating RN: [name] Barman Primary [name] Provider: Park Liter Other Clinician: Referring Provider: Park Liter Treating Provider/Extender: Tito Dine in Treatment: 2 Constitutional Sitting or standing Blood Pressure is within target range for patient.. Pulse regular and within target range for patient.Marland Kitchen Respirations regular, non-labored and within target range.. Temperature is normal and within the target range for the patient.Marland Kitchen appears in no distress. Ears, Nose, Mouth, and Throat Patient is hard of hearing.Marland Kitchen Respiratory Respiratory effort is easy and symmetric bilaterally. Rate is normal at rest and on room air.. Cardiovascular Pedal pulses palpable and strong bilaterally.. Lymphatic none palpable in popliteal area bilaterally. Integumentary (Hair, Skin) chronic venous insufficiency with hemosiderin deposition. No primary skin issues other than that. Psychiatric No evidence of depression, anxiety, or agitation. Calm, cooperative, and communicative. Appropriate interactions and affect.. Notes wound exam; the patient has a better looking wound on the left lateral calf. Surface is considerably better. Evidence of infection or ischemia Electronic Signature(s) Signed: 02/26/2018 6:35:58 PM By: Linton Ham MD Entered By: Linton Ham on 02/26/2018 18:32:55 [name], [name] ([id number]) -------------------------------------------------------------------------------- Physician Orders Details Patient Name: [name] Date of Service: 02/26/2018 1:15 PM Medical Record Number: [id number] Patient Account Number: [id number] Date of Birth/Sex: 03/29/1928 (84 y.o. F) Treating RN: [name] Barman Primary [name] Provider: Park Liter Other Clinician: Referring Provider: Park Liter Treating Provider/Extender: Tito Dine in Treatment:  2 Verbal / Phone Orders: No Diagnosis Coding Wound Cleansing Wound #1 Left,Lateral Lower Leg o Cleanse wound with mild soap and water Anesthetic (add to Medication List) Wound #1 Left,Lateral Lower Leg o Topical Lidocaine 4% cream applied to wound bed prior to debridement (In Clinic Only). o Benzocaine Topical Anesthetic Spray applied to wound bed  prior to debridement (In Clinic Only). Primary Wound Dressing Wound #1 Left,Lateral Lower Leg o Silver Collagen Secondary Dressing Wound #1 Left,Lateral Lower Leg o ABD pad Dressing Change Frequency Wound #1 Left,Lateral Lower Leg o Change dressing every week Follow-up Appointments Wound #1 Left,Lateral Lower Leg o Return Appointment in 1 week. Edema Control Wound #1 Left,Lateral Lower Leg o 3 Layer Compression System - Left Lower Extremity Electronic Signature(s) Signed: 02/26/2018 5:44:56 PM By: Gretta Cool, BSN, RN, CWS, Kim RN, BSN Signed: 02/26/2018 6:35:58 PM By: Linton Ham MD Entered By: Gretta Cool, BSN, RN, CWS, Kim on 02/26/2018 14:03:49 [name], [name] ([id number]) -------------------------------------------------------------------------------- Problem List Details Patient Name: [name] Date of Service: 02/26/2018 1:15 PM Medical Record Number: [id number] Patient Account Number: [id number] Date of Birth/Sex: Mar 26, 1928 (84 y.o. F) Treating RN: [name] Barman Primary [name] Provider: Park Liter Other Clinician: Referring Provider: Park Liter Treating Provider/Extender: Tito Dine in Treatment: 2 Active Problems ICD-10 Evaluated Encounter Code Description Active Date Today Diagnosis L97.222 Non-pressure chronic ulcer of left calf with fat layer exposed 02/12/2018 Yes Yes Status Complications Interventions Improving N/a the patient's wound is improving. Medical Change the primary Decision dressing from Making : Iodoflex to Silver collagen I87.311 Chronic venous hypertension  (idiopathic) with ulcer of right 02/12/2018 Yes Yes lower extremity Status Complications Interventions Medical Improving we have good edema control around the wound which compression that Decision would impair wound healing she leaves on all Making : week S81.812D Laceration without foreign body, left lower leg, subsequent 02/12/2018 No Yes encounter Inactive Problems Resolved Problems Electronic Signature(s) Signed: 02/26/2018 6:35:58 PM By: Linton Ham MD Entered By: Linton Ham on 02/26/2018 18:30:41 [name], [name] ([id number]) -------------------------------------------------------------------------------- Progress Note Details Patient Name: [name] Date of Service: 02/26/2018 1:15 PM Medical Record Number: [id number] Patient Account Number: [id number] Date of Birth/Sex: [birth year] (84 y.o. F) Treating RN: [name] Barman Primary [name] Provider: Park Liter Other Clinician: Referring Provider: Park Liter Treating Provider/Extender: Tito Dine in Treatment: 2 Subjective History of Present Illness (HPI) 02/12/18 ADMISSION This is an 84 year old woman who is recently moved to [place] from [place]. Her story began in February where she fell on some ice suffering extensive lacerations to her bilateral lower extremities. She is able to show me extensive pictures of the right lower leg but with going to a wound [name] center and [place] 3 times a week these eventually closed over. She has been left with 1 area on the upper lateral left calf. She has been applying Neosporin to this and applying a bandage. Currently this measures 2 x 1.5 cm. The patient is not a diabetic. She is an ex-smoker quitting 40 years ago. She does have COPD by description. ABIs in our clinic were 1.32 on the right and 1.03 on the left 02/19/18; right lower leg wound which was initially trauma. The area that we look that last week is smaller. Still covered in a nonviable surface  however. We are using Iodoflex 02/26/18; right lower leg wound which was initially trauma in the setting of chronic venous insufficiency. Surface of the wound looks much better healthy granulation advancing epithelialization. We have good edema control Objective Constitutional Sitting or standing Blood Pressure is within target range for patient.. Pulse regular and within target range for patient.Marland Kitchen Respirations regular, non-labored and within target range.. Temperature is normal and within the target range for the patient.Marland Kitchen appears in no distress. Vitals Time Taken: 1:21 PM, Height: 60 in, Weight: 94.2 lbs, BMI: 18.4, Temperature: 97.3 F, Pulse: 65 bpm,  Respiratory Rate: 16 breaths/min, Blood Pressure: 138/55 mmHg. Ears, Nose, Mouth, and Throat Patient is hard of hearing.Marland Kitchen Respiratory Respiratory effort is easy and symmetric bilaterally. Rate is normal at rest and on room air.. Cardiovascular Pedal pulses palpable and strong bilaterally.. Lymphatic none palpable in popliteal area bilaterally. [name], [name] ([id number]) Psychiatric No evidence of depression, anxiety, or agitation. Calm, cooperative, and communicative. Appropriate interactions and affect.. General Notes: wound exam; the patient has a better looking wound on the left lateral calf. Surface is considerably better. Evidence of infection or ischemia Integumentary (Hair, Skin) chronic venous insufficiency with hemosiderin deposition. No primary skin issues other than that. Wound #1 status is Open. Original cause of wound was Trauma. The wound is located on the Left,Lateral Lower Leg. The wound measures 2cm length x 2.2cm width x 0.1cm depth; 3.456cm^2 area and 0.346cm^3 volume. There is Fat Layer (Subcutaneous Tissue) Exposed exposed. There is no tunneling or undermining noted. There is a large amount of serosanguineous drainage noted. The wound margin is flat and intact. There is large (67-100%) red granulation within  the wound bed. There is a small (1-33%) amount of necrotic tissue within the wound bed including Eschar and Adherent Slough. The periwound skin appearance did not exhibit: Callus, Crepitus, Excoriation, Induration, Rash, Scarring, Dry/Scaly, Maceration, Atrophie Blanche, Cyanosis, Ecchymosis, Hemosiderin Staining, Mottled, Pallor, Rubor, Erythema. Periwound temperature was noted as No Abnormality. The periwound has tenderness on palpation. Assessment Active Problems ICD-10 Non-pressure chronic ulcer of left calf with fat layer exposed Chronic venous hypertension (idiopathic) with ulcer of right lower extremity Laceration without foreign body, left lower leg, subsequent encounter Procedures Wound #1 Pre-procedure diagnosis of Wound #1 is a Trauma, Other located on the Left,Lateral Lower Leg . There was a Three Layer Compression Therapy Procedure with a pre-treatment ABI of 1 by [name] Barman, RN. Post procedure Diagnosis Wound #1: Same as Pre-Procedure Plan Wound Cleansing: Wound #1 Left,Lateral Lower Leg: Cleanse wound with mild soap and water Anesthetic (add to Medication List): Wound #1 Left,Lateral Lower Leg: Topical Lidocaine 4% cream applied to wound bed prior to debridement (In Clinic Only). Benzocaine Topical Anesthetic Spray applied to wound bed prior to debridement (In Clinic Only). Primary Wound Dressing: [name], [name] ([id number]) Wound #1 Left,Lateral Lower Leg: Silver Collagen Secondary Dressing: Wound #1 Left,Lateral Lower Leg: ABD pad Dressing Change Frequency: Wound #1 Left,Lateral Lower Leg: Change dressing every week Follow-up Appointments: Wound #1 Left,Lateral Lower Leg: Return Appointment in 1 week. Edema Control: Wound #1 Left,Lateral Lower Leg: 3 Layer Compression System - Left Lower Extremity Medical Decision Making Non-pressure chronic ulcer of left calf with fat layer exposed 02/12/2018 Status: Improving Complications: N/a Interventions: the  patient's wound is improving. Change the primary dressing from Iodoflex to Silver collagen Chronic venous hypertension (idiopathic) with ulcer of right lower extremity 02/12/2018 Status: Improving Complications: we have good edema control around the wound which would impair wound healing Interventions: compression that she leaves on all week #1Silver collagen/ABDslast 3 to compression. I'm hopeful the Silver collagen will allow for more continuous epithelialization. The auto flex did a great job of preparing the wound bed Electronic Signature(s) Signed: 02/26/2018 6:35:58 PM By: Linton Ham MD Entered By: Linton Ham on 02/26/2018 18:34:18 [name] ([id number]) -------------------------------------------------------------------------------- SuperBill Details Patient Name: [name] Date of Service: 02/26/2018 Medical Record Number: [id number] Patient Account Number: [id number] Date of Birth/Sex: [birth year] (84 y.o. F) Treating RN: [name] Barman Primary [name] Provider: Park Liter Other Clinician: Referring Provider: Park Liter Treating Provider/Extender: Tito Dine in  Treatment: 2 Diagnosis Coding ICD-10 Codes Code Description 6060901709 Non-pressure chronic ulcer of left calf with fat layer exposed I87.311 Chronic venous hypertension (idiopathic) with ulcer of right lower extremity S81.812D Laceration without foreign body, left lower leg, subsequent encounter Facility Procedures CPT4 Code: [id number] Description: (Facility Use Only) [phone] - [facility] LWR LT LEG Modifier: Quantity: 1 Physician Procedures CPT4 Code: [id number] Description: 42103 - WC PHYS LEVEL 3 - EST PT ICD-10 Diagnosis Description L97.222 Non-pressure chronic ulcer of left calf with fat layer exposed I87.311 Chronic venous hypertension (idiopathic) with ulcer of right l Modifier: ower extremity Quantity: 1 Electronic Signature(s) Signed: 02/27/2018 10:09:07 AM By:  Gretta Cool, BSN, RN, CWS, Kim RN, BSN Previous Signature: 02/26/2018 6:35:58 PM Version By: Linton Ham MD Entered By: Gretta Cool, BSN, RN, CWS, Kim on 02/27/2018 10:09:06

## 2018-02-28 NOTE — Progress Notes (Signed)
[name], [name] ([id number]) Visit Report for 02/26/2018 Arrival Information Details Patient Name: [name], [name] Date of Service: 02/26/2018 1:15 PM Medical Record Number: [id number] Patient Account Number: [id number] Date of Birth/Sex: [birth year] (84 y.o. F) Treating RN: Roger Shelter Primary Care Coy Rochford: Park Liter Other Clinician: Referring Gusta Marksberry: Park Liter Treating Trask Vosler/Extender: Tito Dine in Treatment: 2 Visit Information History Since Last Visit All ordered tests and consults were completed: No Patient Arrived: Ambulatory Added or deleted any medications: No Arrival Time: 13:13 Any new allergies or adverse reactions: No Accompanied By: daughter Had a fall or experienced change in No Transfer Assistance: None activities of daily living that may affect Patient Identification Verified: Yes risk of falls: Secondary Verification Process Completed: Yes Signs or symptoms of abuse/neglect since last visito No Patient Requires Transmission-Based No Hospitalized since last visit: No Precautions: Implantable device outside of the clinic excluding No Patient Has Alerts: No cellular tissue based products placed in the center since last visit: Pain Present Now: No Electronic Signature(s) Signed: 02/26/2018 5:20:09 PM By: Roger Shelter Entered By: Roger Shelter on 02/26/2018 13:14:06 [place], Joycelyn Schmid ([id number]) -------------------------------------------------------------------------------- Compression Therapy Details Patient Name: [name] Date of Service: 02/26/2018 1:15 PM Medical Record Number: [id number] Patient Account Number: [id number] Date of Birth/Sex: March 26, 1928 (84 y.o. F) Treating RN: [name] Barman Primary Care Kayden Amend: Park Liter Other Clinician: Referring Amarra Sawyer: Park Liter Treating Randol Zumstein/Extender: Tito Dine in Treatment: 2 Compression Therapy Performed for Wound Assessment: Wound #1  Left,Lateral Lower Leg Performed By: Clinician [name] Barman, RN Compression Type: Three Layer Pre Treatment ABI: 1 Post Procedure Diagnosis Same as Pre-procedure Electronic Signature(s) Signed: 02/26/2018 5:44:56 PM By: Gretta Cool, BSN, RN, CWS, Kim RN, BSN Entered By: Gretta Cool, BSN, RN, CWS, Kim on 02/26/2018 14:04:16 GENOWEFA, MORGA ([id number]) -------------------------------------------------------------------------------- Encounter Discharge Information Details Patient Name: [name] Date of Service: 02/26/2018 1:15 PM Medical Record Number: [id number] Patient Account Number: [id number] Date of Birth/Sex: 08/25/1928 (84 y.o. F) Treating RN: [name] Barman Primary Care Royden Bulman: Park Liter Other Clinician: Referring Lavenia Stumpo: Park Liter Treating Juliani Laduke/Extender: Tito Dine in Treatment: 2 Encounter Discharge Information Items Discharge Condition: Stable Ambulatory Status: Ambulatory Discharge Destination: Home Transportation: Private Auto Schedule Follow-up Appointment: Yes Clinical Summary of Care: Electronic Signature(s) Signed: 02/26/2018 5:44:56 PM By: Gretta Cool, BSN, RN, CWS, Kim RN, BSN Entered By: Gretta Cool, BSN, RN, CWS, Kim on 02/26/2018 14:22:56 [name] ([id number]) -------------------------------------------------------------------------------- Lower Extremity Assessment Details Patient Name: [name] Date of Service: 02/26/2018 1:15 PM Medical Record Number: [id number] Patient Account Number: [id number] Date of Birth/Sex: [birth year] (84 y.o. F) Treating RN: Roger Shelter Primary Care Sahib Pella: Park Liter Other Clinician: Referring Chelsee Hosie: Park Liter Treating Levana Minetti/Extender: Tito Dine in Treatment: 2 Edema Assessment Assessed: [Left: No] [Right: No] Edema: [Left: N] [Right: o] Calf Left: Right: Point of Measurement: 32 cm From Medial Instep 29.2 cm cm Ankle Left: Right: Point of Measurement: 9 cm  From Medial Instep 17.5 cm cm Vascular Assessment Claudication: Claudication Assessment [Left:None] Pulses: Dorsalis Pedis Palpable: [Left:Yes] Posterior Tibial Extremity colors, hair growth, and conditions: Extremity Color: [Left:Normal] Hair Growth on Extremity: [Left:No] Temperature of Extremity: [Left:Warm] Capillary Refill: [Left:< 3 seconds] Toe Nail Assessment Left: Right: Thick: No Discolored: No Deformed: No Improper Length and Hygiene: No Electronic Signature(s) Signed: 02/26/2018 5:20:09 PM By: Roger Shelter Entered By: Roger Shelter on 02/26/2018 13:24:14 [name] ([id number]) -------------------------------------------------------------------------------- Multi Wound Chart Details Patient Name: [name] Date of Service: 02/26/2018 1:15 PM Medical Record Number: [id number] Patient Account Number: [id number] Date of Birth/Sex: 1928/01/01 (  84 y.o. F) Treating RN: [name] Barman Primary Care Tadao Emig: Park Liter Other Clinician: Referring Stefani Baik: Park Liter Treating Yeiden Frenkel/Extender: Tito Dine in Treatment: 2 Vital Signs Height(in): 60 Pulse(bpm): 65 Weight(lbs): 94.2 Blood Pressure(mmHg): 138/55 Body Mass Index(BMI): 18 Temperature(F): 97.3 Respiratory Rate 16 (breaths/min): Photos: [N/A:N/A] Wound Location: Left Lower Leg - Lateral N/A N/A Wounding Event: Trauma N/A N/A Primary Etiology: Trauma, Other N/A N/A Comorbid History: Cataracts, Glaucoma, Asthma, N/A N/A Chronic Obstructive Pulmonary Disease (COPD), Arrhythmia, Hypertension Date Acquired: 10/21/2017 N/A N/A Weeks of Treatment: 2 N/A N/A Wound Status: Open N/A N/A Measurements L x W x D 2x2.2x0.1 N/A N/A (cm) Area (cm) : 3.456 N/A N/A Volume (cm) : 0.346 N/A N/A % Reduction in Area: 12.00% N/A N/A % Reduction in Volume: 12.00% N/A N/A Classification: Full Thickness Without N/A N/A Exposed Support Structures Exudate Amount: Large N/A  N/A Exudate Type: Serosanguineous N/A N/A Exudate Color: red, brown N/A N/A Wound Margin: Flat and Intact N/A N/A Granulation Amount: Large (67-100%) N/A N/A Granulation Quality: Red N/A N/A Necrotic Amount: Small (1-33%) N/A N/A Necrotic Tissue: Eschar, Adherent Slough N/A N/A Exposed Structures: Fat Layer (Subcutaneous N/A N/A Tissue) Exposed: Yes Fascia: No [name], [name] ([id number]) Tendon: No Muscle: No Joint: No Bone: No Epithelialization: None N/A N/A Periwound Skin Texture: Excoriation: No N/A N/A Induration: No Callus: No Crepitus: No Rash: No Scarring: No Periwound Skin Moisture: Maceration: No N/A N/A Dry/Scaly: No Periwound Skin Color: Atrophie Blanche: No N/A N/A Cyanosis: No Ecchymosis: No Erythema: No Hemosiderin Staining: No Mottled: No Pallor: No Rubor: No Temperature: No Abnormality N/A N/A Tenderness on Palpation: Yes N/A N/A Wound Preparation: Ulcer Cleansing: N/A N/A Rinsed/Irrigated with Saline, Other: soap and water Topical Anesthetic Applied: Other: lidocaine 4% Procedures Performed: Compression Therapy N/A N/A Treatment Notes Wound #1 (Left, Lateral Lower Leg) 1. Cleansed with: Clean wound with Normal Saline 2. Anesthetic Topical Lidocaine 4% cream to wound bed prior to debridement 3. Peri-wound Care: Moisturizing lotion 4. Dressing Applied: Prisma Ag 5. Secondary Dressing Applied ABD Pad 7. Secured with 3 Layer Compression System - Left Lower Extremity Notes unna to anchor Electronic Signature(s) Signed: 02/26/2018 6:35:58 PM By: Linton Ham MD Previous Signature: 02/26/2018 5:44:56 PM Version By: Gretta Cool, BSN, RN, CWS, Kim RN, BSN Entered By: Linton Ham on 02/26/2018 18:30:57 [name], [name] ([id number]) [name], [name] ([id number]) -------------------------------------------------------------------------------- Multi-Disciplinary Care Plan Details Patient Name: [name] Date of Service: 02/26/2018 1:15  PM Medical Record Number: [id number] Patient Account Number: [id number] Date of Birth/Sex: 26-Jul-1928 (84 y.o. F) Treating RN: [name] Barman Primary Care Shontae Rosiles: Park Liter Other Clinician: Referring Kaleah Hagemeister: Park Liter Treating Avish Torry/Extender: Tito Dine in Treatment: 2 Active Inactive ` Necrotic Tissue Nursing Diagnoses: Impaired tissue integrity related to necrotic/devitalized tissue Goals: Necrotic/devitalized tissue will be minimized in the wound bed Date Initiated: 02/12/2018 Target Resolution Date: 03/07/2018 Goal Status: Active Interventions: Provide education on necrotic tissue and debridement process Treatment Activities: Apply topical anesthetic as ordered : 02/12/2018 Notes: ` Orientation to the Wound Care Program Nursing Diagnoses: Knowledge deficit related to the wound healing center program Goals: Patient/caregiver will verbalize understanding of the [place] Date Initiated: 02/12/2018 Target Resolution Date: 03/07/2018 Goal Status: Active Interventions: Provide education on orientation to the wound center Notes: ` Pain, Acute or Chronic Nursing Diagnoses: Potential alteration in comfort, pain Goals: Patient will verbalize adequate pain control and receive pain control interventions during procedures as needed Date Initiated: 02/12/2018 Target Resolution Date: 03/07/2018 [name], [name] ([id number]) Goal Status: Active Interventions:  Reposition patient for comfort Notes: ` Wound/Skin Impairment Nursing Diagnoses: Impaired tissue integrity Goals: Ulcer/skin breakdown will have a volume reduction of 80% by week 12 Date Initiated: 02/12/2018 Target Resolution Date: 05/15/2018 Goal Status: Active Interventions: Assess ulceration(s) every visit Treatment Activities: Patient referred to home care : 02/12/2018 Notes: Electronic Signature(s) Signed: 02/26/2018 5:44:56 PM By: Gretta Cool, BSN, RN, CWS, Kim RN, BSN Entered  By: Gretta Cool, BSN, RN, CWS, Kim on 02/26/2018 14:03:10 [name] ([id number]) -------------------------------------------------------------------------------- Pain Assessment Details Patient Name: [name] Date of Service: 02/26/2018 1:15 PM Medical Record Number: [id number] Patient Account Number: [id number] Date of Birth/Sex: 1927-10-01 (84 y.o. F) Treating RN: Roger Shelter Primary Care Kaithlyn Teagle: Park Liter Other Clinician: Referring Demeco Ducksworth: Park Liter Treating Triston Skare/Extender: Tito Dine in Treatment: 2 Active Problems Location of Pain Severity and Description of Pain Patient Has Paino No Site Locations Pain Management and Medication Current Pain Management: Electronic Signature(s) Signed: 02/26/2018 5:20:09 PM By: Roger Shelter Entered By: Roger Shelter on 02/26/2018 13:21:22 [name] ([id number]) -------------------------------------------------------------------------------- Patient/Caregiver Education Details Patient Name: [name] Date of Service: 02/26/2018 1:15 PM Medical Record Number: [id number] Patient Account Number: [id number] Date of Birth/Gender: 1928-09-01 (84 y.o. F) Treating RN: [name] Barman Primary Care Physician: Park Liter Other Clinician: Referring Physician: Park Liter Treating Physician/Extender: Tito Dine in Treatment: 2 Education Assessment Education Provided To: Patient Education Topics Provided Wound Debridement: Handouts: Wound Debridement Methods: Explain/Verbal Responses: State content correctly Wound/Skin Impairment: Handouts: Caring for Your Ulcer Methods: Explain/Verbal Responses: State content correctly Electronic Signature(s) Signed: 02/26/2018 5:44:56 PM By: Gretta Cool, BSN, RN, CWS, Kim RN, BSN Entered By: Gretta Cool, BSN, RN, CWS, Kim on 02/26/2018 14:23:15 [name]  ([id number]) -------------------------------------------------------------------------------- Wound Assessment Details Patient Name: [name] Date of Service: 02/26/2018 1:15 PM Medical Record Number: [id number] Patient Account Number: [id number] Date of Birth/Sex: 1928/03/24 (84 y.o. F) Treating RN: Roger Shelter Primary Care Amr Sturtevant: Park Liter Other Clinician: Referring Lenzie Montesano: Park Liter Treating Audria Takeshita/Extender: Tito Dine in Treatment: 2 Wound Status Wound Number: 1 Primary Trauma, Other Etiology: Wound Location: Left Lower Leg - Lateral Wound Open Wounding Event: Trauma Status: Date Acquired: 10/21/2017 Comorbid Cataracts, Glaucoma, Asthma, Chronic Weeks Of Treatment: 2 History: Obstructive Pulmonary Disease (COPD), Clustered Wound: No Arrhythmia, Hypertension Photos Photo Uploaded By: Roger Shelter on 02/26/2018 17:17:18 Wound Measurements Length: (cm) 2 Width: (cm) 2.2 Depth: (cm) 0.1 Area: (cm) 3.456 Volume: (cm) 0.346 % Reduction in Area: 12% % Reduction in Volume: 12% Epithelialization: None Tunneling: No Undermining: No Wound Description Full Thickness Without Exposed Support Classification: Structures Wound Margin: Flat and Intact Exudate Large Amount: Exudate Type: Serosanguineous Exudate Color: red, brown Foul Odor After Cleansing: No Slough/Fibrino Yes Wound Bed Granulation Amount: Large (67-100%) Exposed Structure Granulation Quality: Red Fascia Exposed: No Necrotic Amount: Small (1-33%) Fat Layer (Subcutaneous Tissue) Exposed: Yes Necrotic Quality: Eschar, Adherent Slough Tendon Exposed: No Muscle Exposed: No Joint Exposed: No Bone Exposed: No [name], [name] ([id number]) Periwound Skin Texture Texture Color No Abnormalities Noted: No No Abnormalities Noted: No Callus: No Atrophie Blanche: No Crepitus: No Cyanosis: No Excoriation: No Ecchymosis: No Induration: No Erythema: No Rash:  No Hemosiderin Staining: No Scarring: No Mottled: No Pallor: No Moisture Rubor: No No Abnormalities Noted: No Dry / Scaly: No Temperature / Pain Maceration: No Temperature: No Abnormality Tenderness on Palpation: Yes Wound Preparation Ulcer Cleansing: Rinsed/Irrigated with Saline, Other: soap and water, Topical Anesthetic Applied: Other: lidocaine 4%, Treatment Notes Wound #1 (Left, Lateral Lower Leg) 1. Cleansed with: Clean wound with Normal Saline  2. Anesthetic Topical Lidocaine 4% cream to wound bed prior to debridement 3. Peri-wound Care: Moisturizing lotion 4. Dressing Applied: Prisma Ag 5. Secondary Dressing Applied ABD Pad 7. Secured with 3 Layer Compression System - Left Lower Extremity Notes unna to anchor Electronic Signature(s) Signed: 02/26/2018 5:20:09 PM By: Roger Shelter Entered By: Roger Shelter on 02/26/2018 13:22:46 [name], [name] ([id number]) -------------------------------------------------------------------------------- Vitals Details Patient Name: [name] Date of Service: 02/26/2018 1:15 PM Medical Record Number: [id number] Patient Account Number: [id number] Date of Birth/Sex: 1928/04/23 (84 y.o. F) Treating RN: Roger Shelter Primary Care Raife Lizer: Park Liter Other Clinician: Referring Alejo Beamer: Park Liter Treating Angelisa Winthrop/Extender: Tito Dine in Treatment: 2 Vital Signs Time Taken: 13:21 Temperature (F): 97.3 Height (in): 60 Pulse (bpm): 65 Weight (lbs): 94.2 Respiratory Rate (breaths/min): 16 Body Mass Index (BMI): 18.4 Blood Pressure (mmHg): 138/55 Reference Range: 80 - 120 mg / dl Electronic Signature(s) Signed: 02/26/2018 5:20:09 PM By: Roger Shelter Entered By: Roger Shelter on 02/26/2018 13:22:17

## 2018-03-05 ENCOUNTER — Encounter: Payer: Medicare Other | Attending: Internal Medicine | Admitting: Internal Medicine

## 2018-03-05 DIAGNOSIS — L97819 Non-pressure chronic ulcer of other part of right lower leg with unspecified severity: Secondary | ICD-10-CM | POA: Insufficient documentation

## 2018-03-05 DIAGNOSIS — J449 Chronic obstructive pulmonary disease, unspecified: Secondary | ICD-10-CM | POA: Diagnosis not present

## 2018-03-05 DIAGNOSIS — S41111A Laceration without foreign body of right upper arm, initial encounter: Secondary | ICD-10-CM | POA: Diagnosis not present

## 2018-03-05 DIAGNOSIS — I10 Essential (primary) hypertension: Secondary | ICD-10-CM | POA: Insufficient documentation

## 2018-03-05 DIAGNOSIS — X58XXXA Exposure to other specified factors, initial encounter: Secondary | ICD-10-CM | POA: Insufficient documentation

## 2018-03-05 DIAGNOSIS — Z87891 Personal history of nicotine dependence: Secondary | ICD-10-CM | POA: Insufficient documentation

## 2018-03-05 DIAGNOSIS — L97822 Non-pressure chronic ulcer of other part of left lower leg with fat layer exposed: Secondary | ICD-10-CM | POA: Insufficient documentation

## 2018-03-06 NOTE — Progress Notes (Addendum)
GINETTE, BRADWAY ([id number]) Visit Report for 03/05/2018 Arrival Information Details Patient Name: DHRUVI, CRENSHAW Date of Service: 03/05/2018 10:45 AM Medical Record Number: [id number] Patient Account Number: [id number] Date of Birth/Sex: Jun 02, 1928 (84 y.o. F) Treating RN: Roger Shelter Primary Care Azim Gillingham: Park Liter Other Clinician: Referring Estelle Greenleaf: Park Liter Treating Antonette Hendricks/Extender: Tito Dine in Treatment: 3 Visit Information History Since Last Visit All ordered tests and consults were completed: No Patient Arrived: Ambulatory Added or deleted any medications: No Arrival Time: 10:59 Any new allergies or adverse reactions: No Accompanied By: daughter Had a fall or experienced change in No Transfer Assistance: None activities of daily living that may affect Patient Identification Verified: Yes risk of falls: Secondary Verification Process Completed: Yes Signs or symptoms of abuse/neglect since last visito No Patient Requires Transmission-Based No Hospitalized since last visit: No Precautions: Pain Present Now: No Patient Has Alerts: No Electronic Signature(s) Signed: 03/05/2018 3:34:51 PM By: Roger Shelter Entered By: Roger Shelter on 03/05/2018 11:00:21 [name] ([id number]) -------------------------------------------------------------------------------- Encounter Discharge Information Details Patient Name: [name] Date of Service: 03/05/2018 10:45 AM Medical Record Number: [id number] Patient Account Number: [id number] Date of Birth/Sex: 1928-07-27 (84 y.o. F) Treating RN: Roger Shelter Primary Care Shanan Mcmiller: Park Liter Other Clinician: Referring Neka Bise: Park Liter Treating Kadey Mihalic/Extender: Tito Dine in Treatment: 3 Encounter Discharge Information Items Discharge Condition: Stable Ambulatory Status: Ambulatory Discharge Destination: Home Transportation: Private Auto Schedule  Follow-up Appointment: Yes Clinical Summary of Care: Electronic Signature(s) Signed: 03/05/2018 3:34:51 PM By: Roger Shelter Entered By: Roger Shelter on 03/05/2018 11:46:11 [name] ([id number]) -------------------------------------------------------------------------------- Lower Extremity Assessment Details Patient Name: [name] Date of Service: 03/05/2018 10:45 AM Medical Record Number: [id number] Patient Account Number: [id number] Date of Birth/Sex: Jan 05, 1928 (84 y.o. F) Treating RN: Roger Shelter Primary Care Cassidy Tashiro: Park Liter Other Clinician: Referring Gracemarie Skeet: Park Liter Treating Alainna Stawicki/Extender: Tito Dine in Treatment: 3 Edema Assessment Assessed: [Left: No] [Right: No] Edema: [Left: N] [Right: o] Calf Left: Right: Point of Measurement: 32 cm From Medial Instep 28 cm cm Ankle Left: Right: Point of Measurement: 9 cm From Medial Instep 17.5 cm cm Vascular Assessment Claudication: Claudication Assessment [Left:None] Pulses: Dorsalis Pedis Palpable: [Left:Yes] Posterior Tibial Extremity colors, hair growth, and conditions: Extremity Color: [Left:Hyperpigmented] Hair Growth on Extremity: [Left:No] Temperature of Extremity: [Left:Warm] Capillary Refill: [Left:< 3 seconds] Toe Nail Assessment Left: Right: Thick: Yes Discolored: Yes Deformed: No Improper Length and Hygiene: No Electronic Signature(s) Signed: 03/05/2018 3:34:51 PM By: Roger Shelter Entered By: Roger Shelter on 03/05/2018 11:07:49 [name] ([id number]) -------------------------------------------------------------------------------- Multi Wound Chart Details Patient Name: [name] Date of Service: 03/05/2018 10:45 AM Medical Record Number: [id number] Patient Account Number: [id number] Date of Birth/Sex: 1928/08/16 (84 y.o. F) Treating RN: Roger Shelter Primary Care Aracelli Woloszyn: Park Liter Other Clinician: Referring  Carey Lafon: Park Liter Treating Yariel Ferraris/Extender: Tito Dine in Treatment: 3 Vital Signs Height(in): 60 Pulse(bpm): 21 Weight(lbs): 94.2 Blood Pressure(mmHg): 149/64 Body Mass Index(BMI): 18 Temperature(F): 97.5 Respiratory Rate 18 (breaths/min): Photos: [N/A:N/A] Wound Location: Left Lower Leg - Lateral Right Forearm N/A Wounding Event: Trauma Trauma N/A Primary Etiology: Trauma, Other Trauma, Other N/A Comorbid History: Cataracts, Glaucoma, Asthma, Cataracts, Glaucoma, Asthma, N/A Chronic Obstructive Chronic Obstructive Pulmonary Disease (COPD), Pulmonary Disease (COPD), Arrhythmia, Hypertension Arrhythmia, Hypertension Date Acquired: 10/21/2017 03/03/2018 N/A Weeks of Treatment: 3 0 N/A Wound Status: Open Open N/A Measurements L x W x D 0.5x0.7x0.1 2.1x1.5x0.1 N/A (cm) Area (cm) : 0.275 2.474 N/A Volume (cm) : 0.027 0.247 N/A % Reduction in Area: 93.00%  N/A N/A % Reduction in Volume: 93.10% N/A N/A Classification: Full Thickness Without Partial Thickness N/A Exposed Support Structures Exudate Amount: Medium Medium N/A Exudate Type: Serous Serous N/A Exudate Color: amber amber N/A Wound Margin: Flat and Intact Distinct, outline attached N/A Granulation Amount: Large (67-100%) Large (67-100%) N/A Granulation Quality: Red Red N/A Necrotic Amount: Small (1-33%) Small (1-33%) N/A Necrotic Tissue: Eschar, Adherent Slough Eschar, Adherent Slough N/A Exposed Structures: Fat Layer (Subcutaneous Fascia: No N/A Tissue) Exposed: Yes Fat Layer (Subcutaneous Fascia: No Tissue) Exposed: No Shinsky, Danaja ([id number]) Tendon: No Tendon: No Muscle: No Muscle: No Joint: No Joint: No Bone: No Bone: No Epithelialization: Small (1-33%) None N/A Debridement: Debridement - Excisional Debridement - Excisional N/A Pre-procedure 11:20 11:20 N/A Verification/Time Out Taken: Pain Control: Other Other N/A Tissue Debrided: Necrotic/Eschar, Subcutaneous, Slough  N/A Subcutaneous, Slough Level: Skin/Subcutaneous Tissue Skin/Subcutaneous Tissue N/A Debridement Area (sq cm): 0.54 3.15 N/A Instrument: Curette Curette, Scissors N/A Bleeding: Minimum Minimum N/A Hemostasis Achieved: Pressure Pressure N/A Debridement Treatment Procedure was tolerated well Procedure was tolerated well N/A Response: Post Debridement 0.6x0.9x0.1 2.1x1.5x0.1 N/A Measurements L x W x D (cm) Post Debridement Volume: 0.042 0.247 N/A (cm) Periwound Skin Texture: Excoriation: No Excoriation: No N/A Induration: No Induration: No Callus: No Callus: No Crepitus: No Crepitus: No Rash: No Rash: No Scarring: No Scarring: No Periwound Skin Moisture: Maceration: No Maceration: No N/A Dry/Scaly: No Dry/Scaly: No Periwound Skin Color: Atrophie Blanche: No Atrophie Blanche: No N/A Cyanosis: No Cyanosis: No Ecchymosis: No Ecchymosis: No Erythema: No Erythema: No Hemosiderin Staining: No Hemosiderin Staining: No Mottled: No Mottled: No Pallor: No Pallor: No Rubor: No Rubor: No Temperature: No Abnormality N/A N/A Tenderness on Palpation: Yes No N/A Wound Preparation: Ulcer Cleansing: Ulcer Cleansing: N/A Rinsed/Irrigated with Saline, Rinsed/Irrigated with Saline Other: soap and water Topical Anesthetic Applied: Topical Anesthetic Applied: Other: lidocaine 4% Other: lidocaine 4% Procedures Performed: Debridement Debridement N/A Treatment Notes Wound #1 (Left, Lateral Lower Leg) 1. Cleansed with: Clean wound with Normal Saline 2. Anesthetic Topical Lidocaine 4% cream to wound bed prior to debridement 4. Dressing Applied: Kelley Polinsky, Alyzza ([id number]) 5. Secondary Dressing Applied ABD Pad 7. Secured with 3 Layer Compression System - Left Lower Extremity Notes arm wrapped with kerlix and coban. Wound #2 (Right Forearm) 1. Cleansed with: Clean wound with Normal Saline 2. Anesthetic Topical Lidocaine 4% cream to wound bed prior to  debridement 4. Dressing Applied: Prisma Ag 5. Secondary Dressing Applied ABD Pad 7. Secured with 3 Layer Compression System - Left Lower Extremity Notes arm wrapped with kerlix and coban. Electronic Signature(s) Signed: 03/07/2018 8:34:07 AM By: Linton Ham MD Previous Signature: 03/05/2018 3:34:51 PM Version By: Roger Shelter Entered By: Linton Ham on 03/05/2018 15:37:26 [name] ([id number]) -------------------------------------------------------------------------------- Multi-Disciplinary Care Plan Details Patient Name: [name] Date of Service: 03/05/2018 10:45 AM Medical Record Number: [id number] Patient Account Number: [id number] Date of Birth/Sex: 12-27-1927 (84 y.o. F) Treating RN: Roger Shelter Primary Care Marshun Duva: Park Liter Other Clinician: Referring Arsen Mangione: Park Liter Treating Dewight Catino/Extender: Tito Dine in Treatment: 3 Active Inactive ` Necrotic Tissue Nursing Diagnoses: Impaired tissue integrity related to necrotic/devitalized tissue Goals: Necrotic/devitalized tissue will be minimized in the wound bed Date Initiated: 02/12/2018 Target Resolution Date: 03/07/2018 Goal Status: Active Interventions: Provide education on necrotic tissue and debridement process Treatment Activities: Apply topical anesthetic as ordered : 02/12/2018 Notes: ` Orientation to the Wound Care Program Nursing Diagnoses: Knowledge deficit related to the wound healing center program Goals: Patient/caregiver will verbalize understanding of  the Wound Healing Center Program Date Initiated: 02/12/2018 Target Resolution Date: 03/07/2018 Goal Status: Active Interventions: Provide education on orientation to the wound center Notes: ` Pain, Acute or Chronic Nursing Diagnoses: Potential alteration in comfort, pain Goals: Patient will verbalize adequate pain control and receive pain control interventions during procedures as needed Date  Initiated: 02/12/2018 Target Resolution Date: 03/07/2018 JANIYLA, LONG ([id number]) Goal Status: Active Interventions: Reposition patient for comfort Notes: ` Wound/Skin Impairment Nursing Diagnoses: Impaired tissue integrity Goals: Ulcer/skin breakdown will have a volume reduction of 80% by week 12 Date Initiated: 02/12/2018 Target Resolution Date: 05/15/2018 Goal Status: Active Interventions: Assess ulceration(s) every visit Treatment Activities: Patient referred to home care : 02/12/2018 Notes: Electronic Signature(s) Signed: 03/05/2018 3:34:51 PM By: Roger Shelter Entered By: Roger Shelter on 03/05/2018 11:21:59 [name] ([id number]) -------------------------------------------------------------------------------- Pain Assessment Details Patient Name: [name] Date of Service: 03/05/2018 10:45 AM Medical Record Number: [id number] Patient Account Number: [id number] Date of Birth/Sex: Jul 18, 1928 (84 y.o. F) Treating RN: Roger Shelter Primary Care Gustaf Mccarter: Park Liter Other Clinician: Referring Lamont Tant: Park Liter Treating Careena Degraffenreid/Extender: Tito Dine in Treatment: 3 Active Problems Location of Pain Severity and Description of Pain Patient Has Paino No Site Locations Pain Management and Medication Current Pain Management: Electronic Signature(s) Signed: 03/05/2018 3:34:51 PM By: Roger Shelter Entered By: Roger Shelter on 03/05/2018 11:00:30 [name] ([id number]) -------------------------------------------------------------------------------- Patient/Caregiver Education Details Patient Name: [name] Date of Service: 03/05/2018 10:45 AM Medical Record Number: [id number] Patient Account Number: [id number] Date of Birth/Gender: 05-Dec-1927 (84 y.o. F) Treating RN: Roger Shelter Primary Care Physician: Park Liter Other Clinician: Referring Physician: Park Liter Treating Physician/Extender:  Tito Dine in Treatment: 3 Education Assessment Education Provided To: Patient Education Topics Provided Wound Debridement: Handouts: Wound Debridement Methods: Explain/Verbal Responses: State content correctly Wound/Skin Impairment: Handouts: Caring for Your Ulcer Methods: Explain/Verbal Responses: State content correctly Electronic Signature(s) Signed: 03/05/2018 3:34:51 PM By: Roger Shelter Entered By: Roger Shelter on 03/05/2018 11:46:34 [place], [place] ([id number]) -------------------------------------------------------------------------------- Wound Assessment Details Patient Name: [name] Date of Service: 03/05/2018 10:45 AM Medical Record Number: [id number] Patient Account Number: [id number] Date of Birth/Sex: [birth year] (84 y.o. F) Treating RN: Roger Shelter Primary Care Brandom Kerwin: Park Liter Other Clinician: Referring Aaliyana Fredericks: Park Liter Treating Fardeen Steinberger/Extender: Tito Dine in Treatment: 3 Wound Status Wound Number: 1 Primary Trauma, Other Etiology: Wound Location: Left Lower Leg - Lateral Wound Open Wounding Event: Trauma Status: Date Acquired: 10/21/2017 Comorbid Cataracts, Glaucoma, Asthma, Chronic Weeks Of Treatment: 3 History: Obstructive Pulmonary Disease (COPD), Clustered Wound: No Arrhythmia, Hypertension Photos Photo Uploaded By: Roger Shelter on 03/05/2018 15:27:23 Wound Measurements Length: (cm) 0.5 Width: (cm) 0.7 Depth: (cm) 0.1 Area: (cm) 0.275 Volume: (cm) 0.027 % Reduction in Area: 93% % Reduction in Volume: 93.1% Epithelialization: Small (1-33%) Tunneling: No Undermining: No Wound Description Full Thickness Without Exposed Support Classification: Structures Wound Margin: Flat and Intact Exudate Medium Amount: Exudate Type: Serous Exudate Color: amber Foul Odor After Cleansing: No Slough/Fibrino Yes Wound Bed Granulation Amount: Large (67-100%) Exposed  Structure Granulation Quality: Red Fascia Exposed: No Necrotic Amount: Small (1-33%) Fat Layer (Subcutaneous Tissue) Exposed: Yes Necrotic Quality: Eschar, Adherent Slough Tendon Exposed: No Muscle Exposed: No Joint Exposed: No Bone Exposed: No Spisak, Ijeoma ([id number]) Periwound Skin Texture Texture Color No Abnormalities Noted: No No Abnormalities Noted: No Callus: No Atrophie Blanche: No Crepitus: No Cyanosis: No Excoriation: No Ecchymosis: No Induration: No Erythema: No Rash: No Hemosiderin Staining: No Scarring: No Mottled: No Pallor: No Moisture Rubor: No No Abnormalities  Noted: No Dry / Scaly: No Temperature / Pain Maceration: No Temperature: No Abnormality Tenderness on Palpation: Yes Wound Preparation Ulcer Cleansing: Rinsed/Irrigated with Saline, Other: soap and water, Topical Anesthetic Applied: Other: lidocaine 4%, Treatment Notes Wound #1 (Left, Lateral Lower Leg) 1. Cleansed with: Clean wound with Normal Saline 2. Anesthetic Topical Lidocaine 4% cream to wound bed prior to debridement 4. Dressing Applied: Prisma Ag 5. Secondary Dressing Applied ABD Pad 7. Secured with 3 Layer Compression System - Left Lower Extremity Notes arm wrapped with kerlix and coban. Electronic Signature(s) Signed: 03/05/2018 3:34:51 PM By: Roger Shelter Entered By: Roger Shelter on 03/05/2018 11:06:35 [name] ([id number]) -------------------------------------------------------------------------------- Wound Assessment Details Patient Name: [name] Date of Service: 03/05/2018 10:45 AM Medical Record Number: [id number] Patient Account Number: [id number] Date of Birth/Sex: [birth year] (84 y.o. F) Treating RN: Roger Shelter Primary Care Nellie Chevalier: Park Liter Other Clinician: Referring Moncia Annas: Park Liter Treating Shamell Suarez/Extender: Tito Dine in Treatment: 3 Wound Status Wound Number: 2 Primary Trauma,  Other Etiology: Wound Location: Right Forearm Wound Open Wounding Event: Trauma Status: Date Acquired: 03/03/2018 Comorbid Cataracts, Glaucoma, Asthma, Chronic Weeks Of Treatment: 0 History: Obstructive Pulmonary Disease (COPD), Clustered Wound: No Arrhythmia, Hypertension Photos Photo Uploaded By: Roger Shelter on 03/05/2018 15:27:24 Wound Measurements Length: (cm) 2.1 Width: (cm) 1.5 Depth: (cm) 0.1 Area: (cm) 2.474 Volume: (cm) 0.247 % Reduction in Area: % Reduction in Volume: Epithelialization: None Tunneling: No Undermining: No Wound Description Classification: Partial Thickness Wound Margin: Distinct, outline attached Exudate Amount: Medium Exudate Type: Serous Exudate Color: amber Foul Odor After Cleansing: No Slough/Fibrino Yes Wound Bed Granulation Amount: Large (67-100%) Exposed Structure Granulation Quality: Red Fascia Exposed: No Necrotic Amount: Small (1-33%) Fat Layer (Subcutaneous Tissue) Exposed: No Necrotic Quality: Eschar, Adherent Slough Tendon Exposed: No Muscle Exposed: No Joint Exposed: No Bone Exposed: No Periwound Skin Texture Garris, Labrenda ([id number]) Texture Color No Abnormalities Noted: No No Abnormalities Noted: No Callus: No Atrophie Blanche: No Crepitus: No Cyanosis: No Excoriation: No Ecchymosis: No Induration: No Erythema: No Rash: No Hemosiderin Staining: No Scarring: No Mottled: No Pallor: No Moisture Rubor: No No Abnormalities Noted: No Dry / Scaly: No Maceration: No Wound Preparation Ulcer Cleansing: Rinsed/Irrigated with Saline Topical Anesthetic Applied: Other: lidocaine 4%, Treatment Notes Wound #2 (Right Forearm) 1. Cleansed with: Clean wound with Normal Saline 2. Anesthetic Topical Lidocaine 4% cream to wound bed prior to debridement 4. Dressing Applied: Prisma Ag 5. Secondary Dressing Applied ABD Pad 7. Secured with 3 Layer Compression System - Left Lower Extremity Notes arm wrapped  with kerlix and coban. Electronic Signature(s) Signed: 03/05/2018 3:34:51 PM By: Roger Shelter Entered By: Roger Shelter on 03/05/2018 11:12:19 GRACIELLA, ARMENT ([id number]) -------------------------------------------------------------------------------- Vitals Details Patient Name: [name] Date of Service: 03/05/2018 10:45 AM Medical Record Number: [id number] Patient Account Number: [id number] Date of Birth/Sex: 1927-11-12 (84 y.o. F) Treating RN: Roger Shelter Primary Care Aitanna Haubner: Park Liter Other Clinician: Referring Tory Mckissack: Park Liter Treating Marialy Urbanczyk/Extender: Tito Dine in Treatment: 3 Vital Signs Time Taken: 11:04 Temperature (F): 97.5 Height (in): 60 Pulse (bpm): 63 Weight (lbs): 94.2 Respiratory Rate (breaths/min): 18 Body Mass Index (BMI): 18.4 Blood Pressure (mmHg): 149/64 Reference Range: 80 - 120 mg / dl Electronic Signature(s) Signed: 03/05/2018 3:34:51 PM By: Roger Shelter Entered By: Roger Shelter on 03/05/2018 11:04:42

## 2018-03-07 NOTE — Progress Notes (Addendum)
Carol, GOVERNALE ([id number]) Visit Report for 03/05/2018 Debridement Details Patient Name: [name], [name] Date of Service: 03/05/2018 10:45 AM Medical Record Number: [id number] Patient Account Number: [id number] Date of Birth/Sex: Mar 13, 1928 (84 y.o. F) Treating RN: [name] Barman Primary Care Provider: Park Liter Other Clinician: Referring Provider: Park Liter Treating Provider/Extender: Tito Dine in Treatment: 3 Debridement Performed for Wound #1 Left,Lateral Lower Leg Assessment: Performed By: Physician Ricard Dillon, MD Debridement Type: Debridement Pre-procedure Verification/Time Yes - 11:20 Out Taken: Start Time: 11:22 Pain Control: Other : lidocaine 4% Total Area Debrided (L x W): 0.6 (cm) x 0.9 (cm) = 0.54 (cm) Tissue and other material Viable, Non-Viable, Eschar, Slough, Subcutaneous, Slough debrided: Level: Skin/Subcutaneous Tissue Debridement Description: Excisional Instrument: Curette Bleeding: Minimum Hemostasis Achieved: Pressure End Time: 11:22 Response to Treatment: Procedure was tolerated well Level of Consciousness: Awake and Alert Post Debridement Measurements of Total Wound Length: (cm) 0.6 Width: (cm) 0.9 Depth: (cm) 0.1 Volume: (cm) 0.042 Character of Wound/Ulcer Post Debridement: Stable Post Procedure Diagnosis Same as Pre-procedure Electronic Signature(s) Signed: 03/05/2018 4:43:24 PM By: Gretta Cool, BSN, RN, CWS, Kim RN, BSN Signed: 03/07/2018 8:34:07 AM By: Linton Ham MD Previous Signature: 03/05/2018 3:34:51 PM Version By: Roger Shelter Entered By: Linton Ham on 03/05/2018 15:37:47 [place], Joycelyn Schmid ([id number]) -------------------------------------------------------------------------------- Debridement Details Patient Name: [name] Date of Service: 03/05/2018 10:45 AM Medical Record Number: [id number] Patient Account Number: [id number] Date of Birth/Sex: Nov 30, 1927 (84 y.o. F) Treating RN: [name] Barman Primary Care Provider: Park Liter Other Clinician: Referring Provider: Park Liter Treating Provider/Extender: Tito Dine in Treatment: 3 Debridement Performed for Wound #2 Right Forearm Assessment: Performed By: Physician Ricard Dillon, MD Debridement Type: Debridement Pre-procedure Verification/Time Yes - 11:20 Out Taken: Start Time: 11:22 Pain Control: Other : lidocaine 4% Total Area Debrided (L x W): 2.1 (cm) x 1.5 (cm) = 3.15 (cm) Tissue and other material Viable, Non-Viable, Slough, Subcutaneous, Skin: Dermis , Slough debrided: Level: Skin/Subcutaneous Tissue Debridement Description: Excisional Instrument: Curette, Scissors Bleeding: Minimum Hemostasis Achieved: Pressure End Time: 11:22 Response to Treatment: Procedure was tolerated well Level of Consciousness: Awake and Alert Post Debridement Measurements of Total Wound Length: (cm) 2.1 Width: (cm) 1.5 Depth: (cm) 0.1 Volume: (cm) 0.247 Character of Wound/Ulcer Post Debridement: Stable Post Procedure Diagnosis Same as Pre-procedure Electronic Signature(s) Signed: 03/05/2018 4:43:24 PM By: Gretta Cool, BSN, RN, CWS, Kim RN, BSN Signed: 03/07/2018 8:34:07 AM By: Linton Ham MD Previous Signature: 03/05/2018 3:34:51 PM Version By: Roger Shelter Entered By: Linton Ham on 03/05/2018 15:38:05 [name] ([id number]) -------------------------------------------------------------------------------- HPI Details Patient Name: [name] Date of Service: 03/05/2018 10:45 AM Medical Record Number: [id number] Patient Account Number: [id number] Date of Birth/Sex: 12/13/1927 (84 y.o. F) Treating RN: [name] Barman Primary Care Provider: Park Liter Other Clinician: Referring Provider: Park Liter Treating Provider/Extender: Tito Dine in Treatment: 3 History of Present Illness HPI Description: 02/12/18 ADMISSION This is an 84 year old woman who is recently moved  to [place] from [place]. Her story began in February where she fell on some ice suffering extensive lacerations to her bilateral lower extremities. She is able to show me extensive pictures of the right lower leg but with going to a wound care center and [place] 3 times a week these eventually closed over. She has been left with 1 area on the upper lateral left calf. She has been applying Neosporin to this and applying a bandage. Currently this measures 2 x 1.5 cm. The patient is not a diabetic. She is an ex-smoker quitting  40 years ago. She does have COPD by description. ABIs in our clinic were 1.32 on the right and 1.03 on the left 02/19/18; right lower leg wound which was initially trauma. The area that we look that last week is smaller. Still covered in a nonviable surface however. We are using Iodoflex 02/26/18; right lower leg wound which was initially trauma in the setting of chronic venous insufficiency. Surface of the wound looks much better healthy granulation advancing epithelialization. We have good edema control 03/05/18; right lower leg wound which was initially trauma in the setting of chronic venous insufficiency. She continues to make nice progress here. We have good edema control oHe arrives today with a new traumatic laceration on her right dorsal forearm which she states happened while she was putting on a [employer]) Signed: 04/28/2018 1:39:14 PM By: Gretta Cool, BSN, RN, CWS, Kim RN, BSN Signed: 05/28/2018 7:41:48 AM By: Linton Ham MD Previous Signature: 03/07/2018 8:34:07 AM Version By: Linton Ham MD Entered By: Gretta Cool, BSN, RN, CWS, Kim on 04/28/2018 13:39:14 TERRIANA, BARRERAS ([id number]) -------------------------------------------------------------------------------- Physical Exam Details Patient Name: [name] Date of Service: 03/05/2018 10:45 AM Medical Record Number: [id number] Patient Account Number: [id number] Date of Birth/Sex:  1927-11-18 (84 y.o. F) Treating RN: [name] Barman Primary Care Provider: Park Liter Other Clinician: Referring Provider: Park Liter Treating Provider/Extender: Tito Dine in Treatment: 3 Constitutional Patient is hypertensive.. Pulse regular and within target range for patient.Marland Kitchen Respirations regular, non-labored and within target range.. Temperature is normal and within the target range for the patient.Marland Kitchen appears in no distress. Notes wound exam; left lateral calf wound still requiring debridement of necrotic surface debris. Hemostasis with direct pressure she tolerates this well oThe new wound on her right dorsal arm has denuded skin that I removed with scissors. Also necrotic debris over the surface. The wound looks like it's actually been there longer than the history would suggest Electronic Signature(s) Signed: 03/07/2018 8:34:07 AM By: Linton Ham MD Entered By: Linton Ham on 03/05/2018 15:42:46 [name] ([id number]) -------------------------------------------------------------------------------- Physician Orders Details Patient Name: [name] Date of Service: 03/05/2018 10:45 AM Medical Record Number: [id number] Patient Account Number: [id number] Date of Birth/Sex: Aug 18, 1928 (84 y.o. F) Treating RN: Roger Shelter Primary Care Provider: Park Liter Other Clinician: Referring Provider: Park Liter Treating Provider/Extender: Tito Dine in Treatment: 3 Verbal / Phone Orders: No Diagnosis Coding Wound Cleansing Wound #1 Left,Lateral Lower Leg o Cleanse wound with mild soap and water Wound #2 Right Forearm o Cleanse wound with mild soap and water Anesthetic (add to Medication List) Wound #1 Left,Lateral Lower Leg o Topical Lidocaine 4% cream applied to wound bed prior to debridement (In Clinic Only). Wound #2 Right Forearm o Topical Lidocaine 4% cream applied to wound bed prior to debridement (In Clinic  Only). Primary Wound Dressing Wound #1 Left,Lateral Lower Leg o Silver Collagen Wound #2 Right Forearm o Silver Collagen Secondary Dressing Wound #1 Left,Lateral Lower Leg o ABD pad Wound #2 Right Forearm o ABD pad o Kerlix and Coban Dressing Change Frequency Wound #1 Left,Lateral Lower Leg o Change dressing every week Wound #2 Right Forearm o Change dressing every week Follow-up Appointments Wound #1 Left,Lateral Lower Leg o Return Appointment in 1 week. Edema Control Wound #1 Left,Lateral Lower Leg Sugrue, [place] ([id number]) o 3 Layer Compression System - Left Lower Extremity Electronic Signature(s) Signed: 03/05/2018 3:34:51 PM By: Roger Shelter Signed: 03/07/2018 8:34:07 AM By: Linton Ham MD Entered By: Roger Shelter on 03/05/2018 11:27:34 [place]in[place], Carlton ([id number]) --------------------------------------------------------------------------------  Problem List Details Patient Name: CHAVELA, JUSTINIANO Date of Service: 03/05/2018 10:45 AM Medical Record Number: [id number] Patient Account Number: [id number] Date of Birth/Sex: May 21, 1928 (84 y.o. F) Treating RN: [name] Barman Primary Care Provider: Park Liter Other Clinician: Referring Provider: Park Liter Treating Provider/Extender: Tito Dine in Treatment: 3 Active Problems ICD-10 Evaluated Encounter Code Description Active Date Today Diagnosis L97.222 Non-pressure chronic ulcer of left calf with fat layer exposed 02/12/2018 Yes Yes Status Complications Interventions Medical Improving N/a wound is doing well Decision continuous over Making : collagen. I87.311 Chronic venous hypertension (idiopathic) with ulcer of right 02/12/2018 Yes Yes lower extremity Status Complications Interventions Medical Improving we have good edema control around the wound which compression that Decision would impair wound healing she leaves on all Making : week S81.812D Laceration without  foreign body, left lower leg, subsequent 02/12/2018 No Yes encounter S41.111D Laceration without foreign body of right upper arm, 03/05/2018 Yes Yes subsequent encounter Status Complications Interventions Other new wound this week. apparently happened recently although she had a fair amount of Medical necrotic debris over Decision the wound surface Making : and some nonviable skin both of which that required removal Inactive Problems Resolved Problems ARACELLY, TENCZA ([id number]) Electronic Signature(s) Signed: 03/07/2018 8:34:07 AM By: Linton Ham MD Entered By: Linton Ham on 03/05/2018 15:35:38 [name] ([id number]) -------------------------------------------------------------------------------- Progress Note Details Patient Name: [name] Date of Service: 03/05/2018 10:45 AM Medical Record Number: [id number] Patient Account Number: [id number] Date of Birth/Sex: 09-20-1927 (84 y.o. F) Treating RN: [name] Barman Primary Care Provider: Park Liter Other Clinician: Referring Provider: Park Liter Treating Provider/Extender: Tito Dine in Treatment: 3 Subjective History of Present Illness (HPI) 02/12/18 ADMISSION This is an 84 year old woman who is recently moved to [place] from [place]. Her story began in February where she fell on some ice suffering extensive lacerations to her bilateral lower extremities. She is able to show me extensive pictures of the right lower leg but with going to a wound care center and [place] 3 times a week these eventually closed over. She has been left with 1 area on the upper lateral left calf. She has been applying Neosporin to this and applying a bandage. Currently this measures 2 x 1.5 cm. The patient is not a diabetic. She is an ex-smoker quitting 40 years ago. She does have COPD by description. ABIs in our clinic were 1.32 on the right and 1.03 on the left 02/19/18; right lower leg wound which was  initially trauma. The area that we look that last week is smaller. Still covered in a nonviable surface however. We are using Iodoflex 02/26/18; right lower leg wound which was initially trauma in the setting of chronic venous insufficiency. Surface of the wound looks much better healthy granulation advancing epithelialization. We have good edema control 03/05/18; right lower leg wound which was initially trauma in the setting of chronic venous insufficiency. She continues to make nice progress here. We have good edema control He arrives today with a new traumatic laceration on her right dorsal forearm which she states happened while she was putting on a sweatshirt Objective Constitutional Patient is hypertensive.. Pulse regular and within target range for patient.Marland Kitchen Respirations regular, non-labored and within target range.. Temperature is normal and within the target range for the patient.Marland Kitchen appears in no distress. Vitals Time Taken: 11:04 AM, Height: 60 in, Weight: 94.2 lbs, BMI: 18.4, Temperature: 97.5 F, Pulse: 63 bpm, Respiratory Rate: 18 breaths/min, Blood Pressure: 149/64 mmHg. General Notes: wound exam; left  lateral calf wound still requiring debridement of necrotic surface debris. Hemostasis with direct pressure she tolerates this well The new wound on her right dorsal arm has denuded skin that I removed with scissors. Also necrotic debris over the surface. The wound looks like it's actually been there longer than the history would suggest Integumentary (Hair, Skin) Wound #1 status is Open. Original cause of wound was Trauma. The wound is located on the Left,Lateral Lower Leg. The wound measures 0.5cm length x 0.7cm width x 0.1cm depth; 0.275cm^2 area and 0.027cm^3 volume. There is Fat Layer Cashatt, Wyvonne ([id number]) (Subcutaneous Tissue) Exposed exposed. There is no tunneling or undermining noted. There is a medium amount of serous drainage noted. The wound margin is flat and  intact. There is large (67-100%) red granulation within the wound bed. There is a small (1-33%) amount of necrotic tissue within the wound bed including Eschar and Adherent Slough. The periwound skin appearance did not exhibit: Callus, Crepitus, Excoriation, Induration, Rash, Scarring, Dry/Scaly, Maceration, Atrophie Blanche, Cyanosis, Ecchymosis, Hemosiderin Staining, Mottled, Pallor, Rubor, Erythema. Periwound temperature was noted as No Abnormality. The periwound has tenderness on palpation. Wound #2 status is Open. Original cause of wound was Trauma. The wound is located on the Right Forearm. The wound measures 2.1cm length x 1.5cm width x 0.1cm depth; 2.474cm^2 area and 0.247cm^3 volume. There is no tunneling or undermining noted. There is a medium amount of serous drainage noted. The wound margin is distinct with the outline attached to the wound base. There is large (67-100%) red granulation within the wound bed. There is a small (1-33%) amount of necrotic tissue within the wound bed including Eschar and Adherent Slough. The periwound skin appearance did not exhibit: Callus, Crepitus, Excoriation, Induration, Rash, Scarring, Dry/Scaly, Maceration, Atrophie Blanche, Cyanosis, Ecchymosis, Hemosiderin Staining, Mottled, Pallor, Rubor, Erythema. Assessment Active Problems ICD-10 Non-pressure chronic ulcer of left calf with fat layer exposed Chronic venous hypertension (idiopathic) with ulcer of right lower extremity Laceration without foreign body, left lower leg, subsequent encounter Laceration without foreign body of right upper arm, subsequent encounter Procedures Wound #1 Pre-procedure diagnosis of Wound #1 is a Trauma, Other located on the Left,Lateral Lower Leg . There was a Excisional Skin/Subcutaneous Tissue Debridement with a total area of 0.54 sq cm performed by Ricard Dillon, MD. With the following instrument(s): Curette to remove Viable and Non-Viable tissue/material.  Material removed includes Eschar, Subcutaneous Tissue, and Slough after achieving pain control using Other (lidocaine 4%). No specimens were taken. A time out was conducted at 11:20, prior to the start of the procedure. A Minimum amount of bleeding was controlled with Pressure. The procedure was tolerated well. Patient s Level of Consciousness post procedure was recorded as Awake and Alert. Post Debridement Measurements: 0.6cm length x 0.9cm width x 0.1cm depth; 0.042cm^3 volume. Character of Wound/Ulcer Post Debridement is stable. Post procedure Diagnosis Wound #1: Same as Pre-Procedure Wound #2 Pre-procedure diagnosis of Wound #2 is a Trauma, Other located on the Right Forearm . There was a Excisional Skin/Subcutaneous Tissue Debridement with a total area of 3.15 sq cm performed by Ricard Dillon, MD. With the following instrument(s): Curette, and Scissors to remove Viable and Non-Viable tissue/material. Material removed includes Subcutaneous Tissue, Slough, and Skin: Dermis after achieving pain control using Other (lidocaine 4%). No specimens were taken. A time out was conducted at 11:20, prior to the start of the procedure. A Minimum amount of bleeding was controlled with Pressure. The procedure was tolerated well. Patient s Level of Consciousness  post procedure was recorded as Awake and Alert. Post Debridement Measurements: 2.1cm length x 1.5cm width x 0.1cm depth; 0.247cm^3 volume. Character of Wound/Ulcer Post Debridement is stable. Post procedure Diagnosis Wound #2: Same as Pre-Procedure Luce, Ninfa ([id number]) Plan Wound Cleansing: Wound #1 Left,Lateral Lower Leg: Cleanse wound with mild soap and water Wound #2 Right Forearm: Cleanse wound with mild soap and water Anesthetic (add to Medication List): Wound #1 Left,Lateral Lower Leg: Topical Lidocaine 4% cream applied to wound bed prior to debridement (In Clinic Only). Wound #2 Right Forearm: Topical Lidocaine 4% cream  applied to wound bed prior to debridement (In Clinic Only). Primary Wound Dressing: Wound #1 Left,Lateral Lower Leg: Silver Collagen Wound #2 Right Forearm: Silver Collagen Secondary Dressing: Wound #1 Left,Lateral Lower Leg: ABD pad Wound #2 Right Forearm: ABD pad Kerlix and Coban Dressing Change Frequency: Wound #1 Left,Lateral Lower Leg: Change dressing every week Wound #2 Right Forearm: Change dressing every week Follow-up Appointments: Wound #1 Left,Lateral Lower Leg: Return Appointment in 1 week. Edema Control: Wound #1 Left,Lateral Lower Leg: 3 Layer Compression System - Left Lower Extremity Medical Decision Making Non-pressure chronic ulcer of left calf with fat layer exposed 02/12/2018 Status: Improving Complications: N/a Interventions: wound is doing well continuous over collagen. Chronic venous hypertension (idiopathic) with ulcer of right lower extremity 02/12/2018 Status: Improving Complications: we have good edema control around the wound which would impair wound healing Interventions: compression that she leaves on all week Laceration without foreign body of right upper arm, subsequent encounter 03/05/2018 Status: Other Complications: new wound this week. Interventions: apparently happened recently although she had a fair amount of necrotic debris over the wound surface and some nonviable skin both of which that required removal #1Silver collagen to both areas moistened with ABDs and compressionKerlix and DEBORRAH, MABIN ([id number]) Electronic Signature(s) Signed: 04/28/2018 1:39:53 PM By: Gretta Cool, BSN, RN, CWS, Kim RN, BSN Signed: 05/28/2018 7:41:48 AM By: Linton Ham MD Previous Signature: 03/07/2018 8:34:07 AM Version By: Linton Ham MD Entered By: Gretta Cool, BSN, RN, CWS, Kim on 04/28/2018 13:39:52 [place], Joycelyn Schmid ([id number]) -------------------------------------------------------------------------------- SuperBill Details Patient Name: [name] Date of Service: 03/05/2018 Medical Record Number: [id number] Patient Account Number: [id number] Date of Birth/Sex: [birth year] (84 y.o. F) Treating RN: [name] Barman Primary Care Provider: Park Liter Other Clinician: Referring Provider: Park Liter Treating Provider/Extender: Tito Dine in Treatment: 3 Diagnosis Coding ICD-10 Codes Code Description [phone] Non-pressure chronic ulcer of left calf with fat layer exposed I87.311 Chronic venous hypertension (idiopathic) with ulcer of right lower extremity S81.812D Laceration without foreign body, left lower leg, subsequent encounter S41.111D Laceration without foreign body of right upper arm, subsequent encounter Facility Procedures CPT4 Code: [id number] Description: 92446 - DEB SUBQ TISSUE 20 SQ CM/< ICD-10 Diagnosis Description L97.222 Non-pressure chronic ulcer of left calf with fat layer exposed S41.111D Laceration without foreign body of right upper arm, subsequent Modifier: encounter Quantity: 1 Physician Procedures CPT4 Code: [id number] Description: 71165 - WC PHYS SUBQ TISS 20 SQ CM ICD-10 Diagnosis Description L97.222 Non-pressure chronic ulcer of left calf with fat layer exposed S41.111D Laceration without foreign body of right upper arm, subsequent Modifier: encounter Quantity: 1 Electronic Signature(s) Signed: 03/07/2018 8:34:07 AM By: Linton Ham MD Entered By: Linton Ham on 03/05/2018 15:43:59

## 2018-03-12 ENCOUNTER — Encounter: Payer: Medicare Other | Admitting: Internal Medicine

## 2018-03-12 DIAGNOSIS — L97819 Non-pressure chronic ulcer of other part of right lower leg with unspecified severity: Secondary | ICD-10-CM | POA: Diagnosis not present

## 2018-03-13 NOTE — Progress Notes (Signed)
RONNEISHA, JETT ([id number]) Visit Report for 03/12/2018 Arrival Information Details Patient Name: [name], [name] Date of Service: 03/12/2018 2:45 PM Medical Record Number: [id number] Patient Account Number: [id number] Date of Birth/Sex: 19-Apr-1928 (84 y.o. F) Treating RN: Secundino Ginger Primary Care Vola Beneke: Park Liter Other Clinician: Referring Lemar Bakos: Park Liter Treating Felecity Lemaster/Extender: Tito Dine in Treatment: 4 Visit Information History Since Last Visit Added or deleted any medications: No Patient Arrived: Ambulatory Any new allergies or adverse reactions: No Arrival Time: 14:52 Had a fall or experienced change in No Accompanied By: daughter activities of daily living that may affect Transfer Assistance: None risk of falls: Patient Identification Verified: Yes Signs or symptoms of abuse/neglect since last visito No Secondary Verification Process Completed: Yes Hospitalized since last visit: No Patient Requires Transmission-Based No Implantable device outside of the clinic excluding No Precautions: cellular tissue based products placed in the center Patient Has Alerts: No since last visit: Has Dressing in Place as Prescribed: Yes Pain Present Now: No Electronic Signature(s) Signed: 03/12/2018 5:04:11 PM By: Secundino Ginger Entered By: Secundino Ginger on 03/12/2018 14:52:51 [name] ([id number]) -------------------------------------------------------------------------------- Lower Extremity Assessment Details Patient Name: [name] Date of Service: 03/12/2018 2:45 PM Medical Record Number: [id number] Patient Account Number: [id number] Date of Birth/Sex: [birth year] (84 y.o. F) Treating RN: Secundino Ginger Primary Care Aubrey Voong: Park Liter Other Clinician: Referring Amorie Rentz: Park Liter Treating Nithin Demeo/Extender: Tito Dine in Treatment: 4 Edema Assessment Assessed: [Left: No] [Right: No] E[Left: dema] [Right:  :] Calf Left: Right: Point of Measurement: 32 cm From Medial Instep 29 cm cm Ankle Left: Right: Point of Measurement: 9 cm From Medial Instep 16.5 cm cm Vascular Assessment Claudication: Claudication Assessment [Left:None] Pulses: Dorsalis Pedis Palpable: [Left:Yes] Posterior Tibial Extremity colors, hair growth, and conditions: Extremity Color: [Left:Hyperpigmented] Hair Growth on Extremity: [Left:No] Temperature of Extremity: [Left:Warm] Capillary Refill: [Left:< 3 seconds] Toe Nail Assessment Left: Right: Thick: No Discolored: No Deformed: No Improper Length and Hygiene: No Electronic Signature(s) Signed: 03/12/2018 5:04:11 PM By: Secundino Ginger Entered By: Secundino Ginger on 03/12/2018 15:09:55 [name] ([id number]) -------------------------------------------------------------------------------- Multi Wound Chart Details Patient Name: [name] Date of Service: 03/12/2018 2:45 PM Medical Record Number: [id number] Patient Account Number: [id number] Date of Birth/Sex: 03/17/1928 (84 y.o. F) Treating RN: Roger Shelter Primary Care Peggyann Zwiefelhofer: Park Liter Other Clinician: Referring Musa Rewerts: Park Liter Treating Quantay Zaremba/Extender: Tito Dine in Treatment: 4 Vital Signs Height(in): 60 Pulse(bpm): 34 Weight(lbs): 94.2 Blood Pressure(mmHg): 149/59 Body Mass Index(BMI): 18 Temperature(F): 97.8 Respiratory Rate 16 (breaths/min): Photos: [N/A:N/A] Wound Location: Left Lower Leg - Lateral Right Forearm N/A Wounding Event: Trauma Trauma N/A Primary Etiology: Trauma, Other Trauma, Other N/A Comorbid History: Cataracts, Glaucoma, Asthma, Cataracts, Glaucoma, Asthma, N/A Chronic Obstructive Chronic Obstructive Pulmonary Disease (COPD), Pulmonary Disease (COPD), Arrhythmia, Hypertension Arrhythmia, Hypertension Date Acquired: 10/21/2017 03/03/2018 N/A Weeks of Treatment: 4 1 N/A Wound Status: Open Open N/A Measurements L x W x D 0.1x0.1x0.1  3.5x1.8x0.1 N/A (cm) Area (cm) : 0.008 4.948 N/A Volume (cm) : 0.001 0.495 N/A % Reduction in Area: 99.80% -100.00% N/A % Reduction in Volume: 99.70% -100.40% N/A Classification: Full Thickness Without Partial Thickness N/A Exposed Support Structures Exudate Amount: None Present Medium N/A Exudate Type: N/A Serous N/A Exudate Color: N/A amber N/A Wound Margin: Flat and Intact Distinct, outline attached N/A Granulation Amount: None Present (0%) Large (67-100%) N/A Granulation Quality: N/A Red N/A Necrotic Amount: Small (1-33%) Small (1-33%) N/A Necrotic Tissue: N/A Eschar, Adherent Slough N/A Exposed Structures: Fat Layer (Subcutaneous Fascia: No N/A Tissue)  Exposed: Yes Fat Layer (Subcutaneous Fascia: No Tissue) Exposed: No [name], [name] ([id number]) Tendon: No Tendon: No Muscle: No Muscle: No Joint: No Joint: No Bone: No Bone: No Epithelialization: Small (1-33%) None N/A Periwound Skin Texture: Excoriation: No Excoriation: No N/A Induration: No Induration: No Callus: No Callus: No Crepitus: No Crepitus: No Rash: No Rash: No Scarring: No Scarring: No Periwound Skin Moisture: Maceration: No Maceration: No N/A Dry/Scaly: No Dry/Scaly: No Periwound Skin Color: Erythema: Yes Erythema: Yes N/A Atrophie Blanche: No Atrophie Blanche: No Cyanosis: No Cyanosis: No Ecchymosis: No Ecchymosis: No Hemosiderin Staining: No Hemosiderin Staining: No Mottled: No Mottled: No Pallor: No Pallor: No Rubor: No Rubor: No Erythema Location: Circumferential Circumferential N/A Temperature: No Abnormality N/A N/A Tenderness on Palpation: Yes No N/A Wound Preparation: Ulcer Cleansing: Ulcer Cleansing: N/A Rinsed/Irrigated with Saline, Rinsed/Irrigated with Saline Other: soap and water Topical Anesthetic Applied: Topical Anesthetic Applied: Other: lidocaine 4% Other: lidocaine 4% Treatment Notes Electronic Signature(s) Signed: 03/12/2018 5:14:04 PM By:  Roger Shelter Entered By: Roger Shelter on 03/12/2018 15:23:34 [name], [name] ([id number]) -------------------------------------------------------------------------------- Multi-Disciplinary Care Plan Details Patient Name: [name] Date of Service: 03/12/2018 2:45 PM Medical Record Number: [id number] Patient Account Number: [id number] Date of Birth/Sex: 10/29/1927 (84 y.o. F) Treating RN: Roger Shelter Primary Care Keaten Mashek: Park Liter Other Clinician: Referring Kiyo Heal: Park Liter Treating Tylena Prisk/Extender: Tito Dine in Treatment: 4 Active Inactive ` Necrotic Tissue Nursing Diagnoses: Impaired tissue integrity related to necrotic/devitalized tissue Goals: Necrotic/devitalized tissue will be minimized in the wound bed Date Initiated: 02/12/2018 Target Resolution Date: 03/07/2018 Goal Status: Active Interventions: Provide education on necrotic tissue and debridement process Treatment Activities: Apply topical anesthetic as ordered : 02/12/2018 Notes: ` Orientation to the Wound Care Program Nursing Diagnoses: Knowledge deficit related to the wound healing center program Goals: Patient/caregiver will verbalize understanding of the [place] Date Initiated: 02/12/2018 Target Resolution Date: 03/07/2018 Goal Status: Active Interventions: Provide education on orientation to the wound center Notes: ` Pain, Acute or Chronic Nursing Diagnoses: Potential alteration in comfort, pain Goals: Patient will verbalize adequate pain control and receive pain control interventions during procedures as needed Date Initiated: 02/12/2018 Target Resolution Date: 03/07/2018 [name], [name] ([id number]) Goal Status: Active Interventions: Reposition patient for comfort Notes: ` Wound/Skin Impairment Nursing Diagnoses: Impaired tissue integrity Goals: Ulcer/skin breakdown will have a volume reduction of 80% by week 12 Date Initiated:  02/12/2018 Target Resolution Date: 05/15/2018 Goal Status: Active Interventions: Assess ulceration(s) every visit Treatment Activities: Patient referred to home care : 02/12/2018 Notes: Electronic Signature(s) Signed: 03/12/2018 5:14:04 PM By: Roger Shelter Entered By: Roger Shelter on 03/12/2018 15:23:25 [name] ([id number]) -------------------------------------------------------------------------------- Pain Assessment Details Patient Name: [name] Date of Service: 03/12/2018 2:45 PM Medical Record Number: [id number] Patient Account Number: [id number] Date of Birth/Sex: 1927-12-06 (84 y.o. F) Treating RN: Secundino Ginger Primary Care Baya Lentz: Park Liter Other Clinician: Referring Jaret Coppedge: Park Liter Treating Kutler Vanvranken/Extender: Tito Dine in Treatment: 4 Active Problems Location of Pain Severity and Description of Pain Patient Has Paino No Site Locations Pain Management and Medication Current Pain Management: Electronic Signature(s) Signed: 03/12/2018 5:04:11 PM By: Secundino Ginger Entered By: Secundino Ginger on 03/12/2018 14:53:10 [name], [name] ([id number]) -------------------------------------------------------------------------------- Wound Assessment Details Patient Name: [name] Date of Service: 03/12/2018 2:45 PM Medical Record Number: [id number] Patient Account Number: [id number] Date of Birth/Sex: 1928/04/18 (84 y.o. F) Treating RN: Secundino Ginger Primary Care Jancarlo Biermann: Park Liter Other Clinician: Referring Maame Dack: Park Liter Treating Sahaana Weitman/Extender: Tito Dine in Treatment: 4 Wound Status  Wound Number: 1 Primary Trauma, Other Etiology: Wound Location: Left Lower Leg - Lateral Wound Open Wounding Event: Trauma Status: Date Acquired: 10/21/2017 Comorbid Cataracts, Glaucoma, Asthma, Chronic Weeks Of Treatment: 4 History: Obstructive Pulmonary Disease (COPD), Clustered Wound: No Arrhythmia,  Hypertension Photos Photo Uploaded By: Secundino Ginger on 03/12/2018 15:18:44 Wound Measurements Length: (cm) 0.1 Width: (cm) 0.1 Depth: (cm) 0.1 Area: (cm) 0.008 Volume: (cm) 0.001 % Reduction in Area: 99.8% % Reduction in Volume: 99.7% Epithelialization: Small (1-33%) Tunneling: No Undermining: No Wound Description Full Thickness Without Exposed Support Foul Odor Classification: Structures Slough/Fi Wound Margin: Flat and Intact Exudate None Present Amount: After Cleansing: No brino No Wound Bed Granulation Amount: None Present (0%) Exposed Structure Necrotic Amount: Small (1-33%) Fascia Exposed: No Fat Layer (Subcutaneous Tissue) Exposed: Yes Tendon Exposed: No Muscle Exposed: No Joint Exposed: No Bone Exposed: No Periwound Skin Texture [name], [name] ([id number]) Texture Color No Abnormalities Noted: No No Abnormalities Noted: No Callus: No Atrophie Blanche: No Crepitus: No Cyanosis: No Excoriation: No Ecchymosis: No Induration: No Erythema: Yes Rash: No Erythema Location: Circumferential Scarring: No Hemosiderin Staining: No Mottled: No Moisture Pallor: No No Abnormalities Noted: No Rubor: No Dry / Scaly: No Maceration: No Temperature / Pain Temperature: No Abnormality Tenderness on Palpation: Yes Wound Preparation Ulcer Cleansing: Rinsed/Irrigated with Saline, Other: soap and water, Topical Anesthetic Applied: Other: lidocaine 4%, Electronic Signature(s) Signed: 03/12/2018 5:04:11 PM By: Secundino Ginger Entered By: Secundino Ginger on 03/12/2018 15:04:22 [name] ([id number]) -------------------------------------------------------------------------------- Wound Assessment Details Patient Name: [name] Date of Service: 03/12/2018 2:45 PM Medical Record Number: [id number] Patient Account Number: [id number] Date of Birth/Sex: 07-21-1928 (84 y.o. F) Treating RN: Secundino Ginger Primary Care Rewa Weissberg: Park Liter Other Clinician: Referring  Karrina Lye: Park Liter Treating Nimesh Riolo/Extender: Tito Dine in Treatment: 4 Wound Status Wound Number: 2 Primary Trauma, Other Etiology: Wound Location: Right Forearm Wound Open Wounding Event: Trauma Status: Date Acquired: 03/03/2018 Comorbid Cataracts, Glaucoma, Asthma, Chronic Weeks Of Treatment: 1 History: Obstructive Pulmonary Disease (COPD), Clustered Wound: No Arrhythmia, Hypertension Photos Photo Uploaded By: Secundino Ginger on 03/12/2018 15:19:23 Wound Measurements Length: (cm) 3.5 Width: (cm) 1.8 Depth: (cm) 0.1 Area: (cm) 4.948 Volume: (cm) 0.495 % Reduction in Area: -100% % Reduction in Volume: -100.4% Epithelialization: None Tunneling: No Undermining: No Wound Description Classification: Partial Thickness Wound Margin: Distinct, outline attached Exudate Amount: Medium Exudate Type: Serous Exudate Color: amber Foul Odor After Cleansing: No Slough/Fibrino Yes Wound Bed Granulation Amount: Large (67-100%) Exposed Structure Granulation Quality: Red Fascia Exposed: No Necrotic Amount: Small (1-33%) Fat Layer (Subcutaneous Tissue) Exposed: No Necrotic Quality: Eschar, Adherent Slough Tendon Exposed: No Muscle Exposed: No Joint Exposed: No Bone Exposed: No Periwound Skin Texture [name], [name] ([id number]) Texture Color No Abnormalities Noted: No No Abnormalities Noted: No Callus: No Atrophie Blanche: No Crepitus: No Cyanosis: No Excoriation: No Ecchymosis: No Induration: No Erythema: Yes Rash: No Erythema Location: Circumferential Scarring: No Hemosiderin Staining: No Mottled: No Moisture Pallor: No No Abnormalities Noted: No Rubor: No Dry / Scaly: No Maceration: No Wound Preparation Ulcer Cleansing: Rinsed/Irrigated with Saline Topical Anesthetic Applied: Other: lidocaine 4%, Electronic Signature(s) Signed: 03/12/2018 5:04:11 PM By: Secundino Ginger Entered By: Secundino Ginger on 03/12/2018 15:07:53 [name]  ([id number]) -------------------------------------------------------------------------------- Vitals Details Patient Name: [name] Date of Service: 03/12/2018 2:45 PM Medical Record Number: [id number] Patient Account Number: [id number] Date of Birth/Sex: 1928/04/04 (84 y.o. F) Treating RN: Secundino Ginger Primary Care Dorthula Bier: Park Liter Other Clinician: Referring Catarina Huntley: Park Liter Treating Lisett Dirusso/Extender: Tito Dine in Treatment:  4 Vital Signs Time Taken: 14:45 Temperature (F): 97.8 Height (in): 60 Pulse (bpm): 80 Weight (lbs): 94.2 Respiratory Rate (breaths/min): 16 Body Mass Index (BMI): 18.4 Blood Pressure (mmHg): 149/59 Reference Range: 80 - 120 mg / dl Electronic Signature(s) Signed: 03/12/2018 5:04:11 PM By: Secundino Ginger Entered By: Secundino Ginger on 03/12/2018 14:53:44

## 2018-03-13 NOTE — Progress Notes (Signed)
DESHARA, ROSSI ([id number]) Visit Report for 03/12/2018 Debridement Details Patient Name: [name], [name] Date of Service: 03/12/2018 2:45 PM Medical Record Number: [id number] Patient Account Number: [id number] Date of Birth/Sex: 1927-11-05 (84 y.o. F) Treating RN: Roger Shelter Primary Care Provider: Park Liter Other Clinician: Referring Provider: Park Liter Treating Provider/Extender: Tito Dine in Treatment: 4 Debridement Performed for Wound #2 Right Forearm Assessment: Performed By: Physician Ricard Dillon, MD Debridement Type: Debridement Pre-procedure Verification/Time Yes - 15:24 Out Taken: Start Time: 15:24 Pain Control: Other : lidocaine 4% Total Area Debrided (L x W): 3.5 (cm) x 1.8 (cm) = 6.3 (cm) Tissue and other material Viable, Non-Viable, Slough, Subcutaneous, Biofilm, Slough debrided: Level: Skin/Subcutaneous Tissue Debridement Description: Excisional Instrument: Curette Bleeding: Minimum Hemostasis Achieved: Pressure End Time: 15:25 Procedural Pain: 0 Post Procedural Pain: 0 Response to Treatment: Procedure was tolerated well Level of Consciousness: Awake and Alert Post Debridement Measurements of Total Wound Length: (cm) 3.5 Width: (cm) 1.8 Depth: (cm) 0.1 Volume: (cm) 0.495 Character of Wound/Ulcer Post Debridement: Stable Post Procedure Diagnosis Same as Pre-procedure Electronic Signature(s) Signed: 03/12/2018 5:14:04 PM By: Roger Shelter Signed: 03/12/2018 6:13:38 PM By: Linton Ham MD Entered By: Linton Ham on 03/12/2018 16:08:52 [name] ([id number]) -------------------------------------------------------------------------------- HPI Details Patient Name: [name] Date of Service: 03/12/2018 2:45 PM Medical Record Number: [id number] Patient Account Number: [id number] Date of Birth/Sex: 1928/01/05 (84 y.o. F) Treating RN: Roger Shelter Primary Care Provider: Park Liter Other Clinician: Referring Provider: Park Liter Treating Provider/Extender: Tito Dine in Treatment: 4 History of Present Illness HPI Description: 02/12/18 ADMISSION This is an 84 year old woman who is recently moved to [place] from [place]. Her story began in February where she fell on some ice suffering extensive lacerations to her bilateral lower extremities. She is able to show me extensive pictures of the right lower leg but with going to a wound care center and [place] 3 times a week these eventually closed over. She has been left with 1 area on the upper lateral left calf. She has been applying Neosporin to this and applying a bandage. Currently this measures 2 x 1.5 cm. The patient is not a diabetic. She is an ex-smoker quitting 40 years ago. She does have COPD by description. ABIs in our clinic were 1.32 on the right and 1.03 on the left 02/19/18; right lower leg wound which was initially trauma. The area that we look that last week is smaller. Still covered in a nonviable surface however. We are using Iodoflex 02/26/18; right lower leg wound which was initially trauma in the setting of chronic venous insufficiency. Surface of the wound looks much better healthy granulation advancing epithelialization. We have good edema control 03/05/18; right lower leg wound which was initially trauma in the setting of chronic venous insufficiency. She continues to make nice progress here. We have good edema control oHe arrives today with a new traumatic laceration on her right dorsal forearm which she states happened while she was putting on a sweatshirt 03/12/18; right lower leg wound as closed however it still looks vulnerable. She has chronic venous insufficiency. oThe new wound from last week a traumatic area on her right dorsal or arm unfortunately does not have a viable surface. I remove some nonviable skin and necrotic subcutaneous debris from the wound surface.  Hemostasis with silver nitrate and direct pressure Electronic Signature(s) Signed: 03/12/2018 6:13:38 PM By: Linton Ham MD Entered By: Linton Ham on 03/12/2018 16:26:00 [name], [name] ([id number]) -------------------------------------------------------------------------------- Physical Exam Details Patient Name:  [name] Date of Service: 03/12/2018 2:45 PM Medical Record Number: [id number] Patient Account Number: [id number] Date of Birth/Sex: 10-29-1927 (84 y.o. F) Treating RN: Roger Shelter Primary Care Provider: Park Liter Other Clinician: Referring Provider: Park Liter Treating Provider/Extender: Tito Dine in Treatment: 4 Constitutional Patient is hypertensive.. Pulse regular and within target range for patient.Marland Kitchen Respirations regular, non-labored and within target range.. Temperature is normal and within the target range for the patient.Marland Kitchen appears in no distress. Notes wound exam; left lateral calf wound is epithelialized. Over the surface looks small marble and I'm going to put this under compression for another week. She will need support stockings bilaterally on her lower legs oThe big problem here is the wound on her right dorsal forearm. This had nonviable skin probably a flap of skin that is now become nonviable. I removed this with a #5 curet she also had necrotic debris over most of the wound area. Electronic Signature(s) Signed: 03/12/2018 6:13:38 PM By: Linton Ham MD Entered By: Linton Ham on 03/12/2018 16:27:35 [name] ([id number]) -------------------------------------------------------------------------------- Physician Orders Details Patient Name: [name] Date of Service: 03/12/2018 2:45 PM Medical Record Number: [id number] Patient Account Number: [id number] Date of Birth/Sex: 07/19/1928 (84 y.o. F) Treating RN: Roger Shelter Primary Care Provider: Park Liter Other Clinician: Referring  Provider: Park Liter Treating Provider/Extender: Tito Dine in Treatment: 4 Verbal / Phone Orders: No Diagnosis Coding Wound Cleansing Wound #1 Left,Lateral Lower Leg o Cleanse wound with mild soap and water Wound #2 Right Forearm o Cleanse wound with mild soap and water Anesthetic (add to Medication List) Wound #1 Left,Lateral Lower Leg o Topical Lidocaine 4% cream applied to wound bed prior to debridement (In Clinic Only). Wound #2 Right Forearm o Topical Lidocaine 4% cream applied to wound bed prior to debridement (In Clinic Only). Primary Wound Dressing Wound #1 Left,Lateral Lower Leg o Collagen Wound #2 Right Forearm o Collagen Secondary Dressing Wound #1 Left,Lateral Lower Leg o ABD pad Wound #2 Right Forearm o Kerlix and Coban o Non-adherent pad Dressing Change Frequency Wound #1 Left,Lateral Lower Leg o Change dressing every week Wound #2 Right Forearm o Change dressing every week Follow-up Appointments Wound #1 Left,Lateral Lower Leg o Return Appointment in 1 week. Edema Control Wound #1 Left,Lateral Lower Leg [name], [name] ([id number]) o 3 Layer Compression System - Left Lower Extremity Electronic Signature(s) Signed: 03/12/2018 5:14:04 PM By: Roger Shelter Signed: 03/12/2018 6:13:38 PM By: Linton Ham MD Entered By: Roger Shelter on 03/12/2018 15:29:32 ASHAYA, RAFTERY ([id number]) -------------------------------------------------------------------------------- Problem List Details Patient Name: [name] Date of Service: 03/12/2018 2:45 PM Medical Record Number: [id number] Patient Account Number: [id number] Date of Birth/Sex: [birth year] (84 y.o. F) Treating RN: Roger Shelter Primary Care Provider: Park Liter Other Clinician: Referring Provider: Park Liter Treating Provider/Extender: Tito Dine in Treatment: 4 Active Problems ICD-10 Evaluated Encounter Code  Description Active Date Today Diagnosis L97.222 Non-pressure chronic ulcer of left calf with fat layer exposed 02/12/2018 Yes Yes Status Complications Interventions Medical Improving his areas closed when is fully Decision epithelialized but Making : vulnerable I87.311 Chronic venous hypertension (idiopathic) with ulcer of right 02/12/2018 Yes Yes lower extremity Status Complications Interventions Medical Improving we have good edema control around the wound which compression that Decision would impair wound healing she leaves on all Making : week S81.812D Laceration without foreign body, left lower leg, subsequent 02/12/2018 No Yes encounter S41.111D Laceration without foreign body of right upper arm, 03/05/2018 Yes Yes subsequent encounter Status Complications Interventions No  no major improvements debrided of change nonviable skin and Medical necrotic tissue. The Decision wound bed cleans Making : up nicely but there is really been no improvement Inactive Problems Resolved Problems Electronic Signature(s) Signed: 03/12/2018 6:13:38 PM By: Linton Ham MD [name], [name] ([id number]) Entered By: Linton Ham on 03/12/2018 16:10:43 [name] ([id number]) -------------------------------------------------------------------------------- Progress Note Details Patient Name: [name] Date of Service: 03/12/2018 2:45 PM Medical Record Number: [id number] Patient Account Number: [id number] Date of Birth/Sex: [birth year] (84 y.o. F) Treating RN: Roger Shelter Primary Care Provider: Park Liter Other Clinician: Referring Provider: Park Liter Treating Provider/Extender: Tito Dine in Treatment: 4 Subjective History of Present Illness (HPI) 02/12/18 ADMISSION This is an 84 year old woman who is recently moved to [place] from [place]. Her story began in February where she fell on some ice suffering extensive lacerations to her bilateral  lower extremities. She is able to show me extensive pictures of the right lower leg but with going to a wound care center and [place] 3 times a week these eventually closed over. She has been left with 1 area on the upper lateral left calf. She has been applying Neosporin to this and applying a bandage. Currently this measures 2 x 1.5 cm. The patient is not a diabetic. She is an ex-smoker quitting 40 years ago. She does have COPD by description. ABIs in our clinic were 1.32 on the right and 1.03 on the left 02/19/18; right lower leg wound which was initially trauma. The area that we look that last week is smaller. Still covered in a nonviable surface however. We are using Iodoflex 02/26/18; right lower leg wound which was initially trauma in the setting of chronic venous insufficiency. Surface of the wound looks much better healthy granulation advancing epithelialization. We have good edema control 03/05/18; right lower leg wound which was initially trauma in the setting of chronic venous insufficiency. She continues to make nice progress here. We have good edema control He arrives today with a new traumatic laceration on her right dorsal forearm which she states happened while she was putting on a sweatshirt 03/12/18; right lower leg wound as closed however it still looks vulnerable. She has chronic venous insufficiency. The new wound from last week a traumatic area on her right dorsal or arm unfortunately does not have a viable surface. I remove some nonviable skin and necrotic subcutaneous debris from the wound surface. Hemostasis with silver nitrate and direct pressure Objective Constitutional Patient is hypertensive.. Pulse regular and within target range for patient.Marland Kitchen Respirations regular, non-labored and within target range.. Temperature is normal and within the target range for the patient.Marland Kitchen appears in no distress. Vitals Time Taken: 2:45 PM, Height: 60 in, Weight: 94.2 lbs, BMI: 18.4,  Temperature: 97.8 F, Pulse: 80 bpm, Respiratory Rate: 16 breaths/min, Blood Pressure: 149/59 mmHg. General Notes: wound exam; left lateral calf wound is epithelialized. Over the surface looks small marble and I'm going to put this under compression for another week. She will need support stockings bilaterally on her lower legs The big problem here is the wound on her right dorsal forearm. This had nonviable skin probably a flap of skin that is now become nonviable. I removed this with a #5 curet she also had necrotic debris over most of the wound area. [name], [name] ([id number]) Integumentary (Hair, Skin) Wound #1 status is Open. Original cause of wound was Trauma. The wound is located on the Left,Lateral Lower Leg. The wound measures 0.1cm length x 0.1cm width x 0.1cm depth;  0.008cm^2 area and 0.001cm^3 volume. There is Fat Layer (Subcutaneous Tissue) Exposed exposed. There is no tunneling or undermining noted. There is a none present amount of drainage noted. The wound margin is flat and intact. There is no granulation within the wound bed. There is a small (1-33%) amount of necrotic tissue within the wound bed. The periwound skin appearance exhibited: Erythema. The periwound skin appearance did not exhibit: Callus, Crepitus, Excoriation, Induration, Rash, Scarring, Dry/Scaly, Maceration, Atrophie Blanche, Cyanosis, Ecchymosis, Hemosiderin Staining, Mottled, Pallor, Rubor. The surrounding wound skin color is noted with erythema which is circumferential. Periwound temperature was noted as No Abnormality. The periwound has tenderness on palpation. Wound #2 status is Open. Original cause of wound was Trauma. The wound is located on the Right Forearm. The wound measures 3.5cm length x 1.8cm width x 0.1cm depth; 4.948cm^2 area and 0.495cm^3 volume. There is no tunneling or undermining noted. There is a medium amount of serous drainage noted. The wound margin is distinct with the  outline attached to the wound base. There is large (67-100%) red granulation within the wound bed. There is a small (1-33%) amount of necrotic tissue within the wound bed including Eschar and Adherent Slough. The periwound skin appearance exhibited: Erythema. The periwound skin appearance did not exhibit: Callus, Crepitus, Excoriation, Induration, Rash, Scarring, Dry/Scaly, Maceration, Atrophie Blanche, Cyanosis, Ecchymosis, Hemosiderin Staining, Mottled, Pallor, Rubor. The surrounding wound skin color is noted with erythema which is circumferential. Assessment Active Problems ICD-10 Non-pressure chronic ulcer of left calf with fat layer exposed Chronic venous hypertension (idiopathic) with ulcer of right lower extremity Laceration without foreign body, left lower leg, subsequent encounter Laceration without foreign body of right upper arm, subsequent encounter Procedures Wound #2 Pre-procedure diagnosis of Wound #2 is a Trauma, Other located on the Right Forearm . There was a Excisional Skin/Subcutaneous Tissue Debridement with a total area of 6.3 sq cm performed by Ricard Dillon, MD. With the following instrument(s): Curette to remove Viable and Non-Viable tissue/material. Material removed includes Subcutaneous Tissue, Slough, and Biofilm after achieving pain control using Other (lidocaine 4%). No specimens were taken. A time out was conducted at 15:24, prior to the start of the procedure. A Minimum amount of bleeding was controlled with Pressure. The procedure was tolerated well with a pain level of 0 throughout and a pain level of 0 following the procedure. Patient s Level of Consciousness post procedure was recorded as Awake and Alert. Post Debridement Measurements: 3.5cm length x 1.8cm width x 0.1cm depth; 0.495cm^3 volume. Character of Wound/Ulcer Post Debridement is stable. Post procedure Diagnosis Wound #2: Same as Pre-Procedure [name], [name] ([id number]) Plan Wound  Cleansing: Wound #1 Left,Lateral Lower Leg: Cleanse wound with mild soap and water Wound #2 Right Forearm: Cleanse wound with mild soap and water Anesthetic (add to Medication List): Wound #1 Left,Lateral Lower Leg: Topical Lidocaine 4% cream applied to wound bed prior to debridement (In Clinic Only). Wound #2 Right Forearm: Topical Lidocaine 4% cream applied to wound bed prior to debridement (In Clinic Only). Primary Wound Dressing: Wound #1 Left,Lateral Lower Leg: Collagen Wound #2 Right Forearm: Collagen Secondary Dressing: Wound #1 Left,Lateral Lower Leg: ABD pad Wound #2 Right Forearm: Kerlix and Coban Non-adherent pad Dressing Change Frequency: Wound #1 Left,Lateral Lower Leg: Change dressing every week Wound #2 Right Forearm: Change dressing every week Follow-up Appointments: Wound #1 Left,Lateral Lower Leg: Return Appointment in 1 week. Edema Control: Wound #1 Left,Lateral Lower Leg: 3 Layer Compression System - Left Lower Extremity Medical Decision Making Non-pressure  chronic ulcer of left calf with fat layer exposed 02/12/2018 Status: Improving Complications: his areas closed Interventions: when is fully epithelialized but vulnerable Chronic venous hypertension (idiopathic) with ulcer of right lower extremity 02/12/2018 Status: Improving Complications: we have good edema control around the wound which would impair wound healing Interventions: compression that she leaves on all week Laceration without foreign body of right upper arm, subsequent encounter 03/05/2018 Status: No change Complications: no major improvements Interventions: debrided of nonviable skin and necrotic tissue. The wound bed cleans up nicely but there is really been no improvement #1I continue to be a collagen on the left lateral lower leg under compression just to have this area mature. She should be able to transition in the stockings next week #2 we continued with collagen to the forearm is  going to be a more difficult issue. Extensive debridement today [name], [name] ([id number]) Electronic Signature(s) Signed: 03/12/2018 6:13:38 PM By: Linton Ham MD Entered By: Linton Ham on 03/12/2018 16:28:49 [name], [name] ([id number]) -------------------------------------------------------------------------------- SuperBill Details Patient Name: [name] Date of Service: 03/12/2018 Medical Record Number: [id number] Patient Account Number: [id number] Date of Birth/Sex: 10-13-1927 (84 y.o. F) Treating RN: Roger Shelter Primary Care Provider: Park Liter Other Clinician: Referring Provider: Park Liter Treating Provider/Extender: Tito Dine in Treatment: 4 Diagnosis Coding ICD-10 Codes Code Description [phone] Non-pressure chronic ulcer of left calf with fat layer exposed I87.311 Chronic venous hypertension (idiopathic) with ulcer of right lower extremity S81.812D Laceration without foreign body, left lower leg, subsequent encounter S41.111D Laceration without foreign body of right upper arm, subsequent encounter Facility Procedures CPT4 Code: [id number] Description: 48185 - DEB SUBQ TISSUE 20 SQ CM/< ICD-10 Diagnosis Description S41.111D Laceration without foreign body of right upper arm, subsequent Modifier: encounter Quantity: 1 Physician Procedures CPT4 Code: [id number] Description: 26378 - WC PHYS SUBQ TISS 20 SQ CM ICD-10 Diagnosis Description S41.111D Laceration without foreign body of right upper arm, subsequent Modifier: encounter Quantity: 1 Electronic Signature(s) Signed: 03/12/2018 6:13:38 PM By: Linton Ham MD Entered By: Linton Ham on 03/12/2018 16:29:11

## 2018-03-19 ENCOUNTER — Encounter: Payer: Medicare Other | Admitting: Internal Medicine

## 2018-03-19 DIAGNOSIS — L97819 Non-pressure chronic ulcer of other part of right lower leg with unspecified severity: Secondary | ICD-10-CM | POA: Diagnosis not present

## 2018-03-20 NOTE — Progress Notes (Addendum)
Carol, Wise ([id number]) Visit Report for 03/19/2018 Arrival Information Details Patient Name: [name], [name] Date of Service: 03/19/2018 2:45 PM Medical Record Number: [id number] Patient Account Number: [id number] Date of Birth/Sex: 1928-07-30 (84 y.o. F) Treating RN: Ahmed Prima Primary Care Welles Walthall: Park Liter Other Clinician: Referring Katrin Grabel: Park Liter Treating Fredrick Geoghegan/Extender: Tito Dine in Treatment: 5 Visit Information History Since Last Visit All ordered tests and consults were completed: No Patient Arrived: Ambulatory Added or deleted any medications: No Arrival Time: 14:44 Any new allergies or adverse reactions: No Accompanied By: daughter Had a fall or experienced change in No Transfer Assistance: None activities of daily living that may affect Patient Identification Verified: Yes risk of falls: Secondary Verification Process Completed: Yes Signs or symptoms of abuse/neglect since last visito No Patient Requires Transmission-Based No Hospitalized since last visit: No Precautions: Implantable device outside of the clinic excluding No Patient Has Alerts: No cellular tissue based products placed in the center since last visit: Has Dressing in Place as Prescribed: Yes Has Compression in Place as Prescribed: Yes Pain Present Now: No Electronic Signature(s) Signed: 03/19/2018 5:07:56 PM By: Alric Quan Entered By: Alric Quan on 03/19/2018 14:46:02 [place], Joycelyn Schmid ([id number]) -------------------------------------------------------------------------------- Encounter Discharge Information Details Patient Name: [name] Date of Service: 03/19/2018 2:45 PM Medical Record Number: [id number] Patient Account Number: [id number] Date of Birth/Sex: [birth year] (84 y.o. F) Treating RN: Montey Hora Primary Care Jakyiah Briones: Park Liter Other Clinician: Referring Rayetta Veith: Park Liter Treating Adena Sima/Extender:  Tito Dine in Treatment: 5 Encounter Discharge Information Items Discharge Condition: Stable Ambulatory Status: Ambulatory Discharge Destination: Home Transportation: Private Auto Accompanied By: dtr Schedule Follow-up Appointment: Yes Clinical Summary of Care: Electronic Signature(s) Signed: 03/19/2018 4:30:25 PM By: Montey Hora Entered By: Montey Hora on 03/19/2018 16:30:24 [name] ([id number]) -------------------------------------------------------------------------------- Lower Extremity Assessment Details Patient Name: [name] Date of Service: 03/19/2018 2:45 PM Medical Record Number: [id number] Patient Account Number: [id number] Date of Birth/Sex: 27-Dec-1927 (84 y.o. F) Treating RN: Ahmed Prima Primary Care Mia Winthrop: Park Liter Other Clinician: Referring Zarai Orsborn: Park Liter Treating Iyonnah Ferrante/Extender: Tito Dine in Treatment: 5 Edema Assessment Assessed: [Left: No] [Right: No] E[Left: dema] [Right: :] Calf Left: Right: Point of Measurement: 32 cm From Medial Instep 29.3 cm cm Ankle Left: Right: Point of Measurement: 9 cm From Medial Instep 17.8 cm cm Vascular Assessment Pulses: Dorsalis Pedis Palpable: [Left:Yes] Posterior Tibial Extremity colors, hair growth, and conditions: Extremity Color: [Left:Hyperpigmented] Temperature of Extremity: [Left:Warm] Capillary Refill: [Left:< 3 seconds] Toe Nail Assessment Left: Right: Thick: Yes Discolored: No Deformed: No Improper Length and Hygiene: No Electronic Signature(s) Signed: 03/19/2018 5:07:56 PM By: Alric Quan Entered By: Alric Quan on 03/19/2018 14:51:18 [place], Joycelyn Schmid ([id number]) -------------------------------------------------------------------------------- Multi Wound Chart Details Patient Name: [name] Date of Service: 03/19/2018 2:45 PM Medical Record Number: [id number] Patient Account Number: [id number] Date of  Birth/Sex: April 05, 1928 (84 y.o. F) Treating RN: Ahmed Prima Primary Care Trevionne Advani: Park Liter Other Clinician: Referring Wm Fruchter: Park Liter Treating Loleta Frommelt/Extender: Tito Dine in Treatment: 5 Vital Signs Height(in): 60 Pulse(bpm): 59 Weight(lbs): 94.2 Blood Pressure(mmHg): 132/56 Body Mass Index(BMI): 18 Temperature(F): 97.8 Respiratory Rate 16 (breaths/min): Photos: Wound Location: Left, Lateral Lower Leg Right Forearm Right Elbow Wounding Event: Trauma Trauma Trauma Primary Etiology: Trauma, Other Trauma, Other Trauma, Other Comorbid History: Cataracts, Glaucoma, Asthma, Cataracts, Glaucoma, Asthma, Cataracts, Glaucoma, Asthma, Chronic Obstructive Chronic Obstructive Chronic Obstructive Pulmonary Disease (COPD), Pulmonary Disease (COPD), Pulmonary Disease (COPD), Arrhythmia, Hypertension Arrhythmia, Hypertension Arrhythmia, Hypertension Date Acquired: 10/21/2017 03/03/2018 03/17/2018 Weeks of Treatment:  5 2 0 Wound Status: Healed - Epithelialized Open Open Measurements L x W x D 0x0x0 2.8x1.6x0.1 0.6x1.5x0.1 (cm) Area (cm) : 0 3.519 0.707 Volume (cm) : 0 0.352 0.071 % Reduction in Area: 100.00% -42.20% N/A % Reduction in Volume: 100.00% -42.50% N/A Classification: Full Thickness Without Partial Thickness Full Thickness Without Exposed Support Structures Exposed Support Structures Exudate Amount: None Present Large Large Exudate Type: N/A Serous Serous Exudate Color: N/A amber amber Wound Margin: Flat and Intact Distinct, outline attached Distinct, outline attached Granulation Amount: None Present (0%) None Present (0%) None Present (0%) Necrotic Amount: None Present (0%) Large (67-100%) Large (67-100%) Exposed Structures: Fascia: No Fascia: No Fascia: No Fat Layer (Subcutaneous Fat Layer (Subcutaneous Fat Layer (Subcutaneous Tissue) Exposed: No Tissue) Exposed: No Tissue) Exposed: No Tendon: No Tendon: No Tendon: No Muscle:  No Muscle: No Muscle: No Melikian, Annalisia ([id number]) Joint: No Joint: No Joint: No Bone: No Bone: No Bone: No Epithelialization: Small (1-33%) None None Debridement: N/A Debridement - Excisional N/A Pre-procedure N/A 15:35 N/A Verification/Time Out Taken: Pain Control: N/A Other N/A Tissue Debrided: N/A Subcutaneous, Slough N/A Level: N/A Skin/Subcutaneous Tissue N/A Debridement Area (sq cm): N/A 4.48 N/A Instrument: N/A Curette N/A Bleeding: N/A Moderate N/A Hemostasis Achieved: N/A Silver Nitrate N/A Procedural Pain: N/A 0 N/A Post Procedural Pain: N/A 0 N/A Debridement Treatment N/A Procedure was tolerated well N/A Response: Post Debridement N/A 2.8x1.6x0.2 N/A Measurements L x W x D (cm) Post Debridement Volume: N/A 0.704 N/A (cm) Periwound Skin Texture: Excoriation: No Excoriation: No No Abnormalities Noted Induration: No Induration: No Callus: No Callus: No Crepitus: No Crepitus: No Rash: No Rash: No Scarring: No Scarring: No Periwound Skin Moisture: Maceration: No Maceration: No Maceration: Yes Dry/Scaly: No Dry/Scaly: No Periwound Skin Color: Atrophie Blanche: No Erythema: Yes No Abnormalities Noted Cyanosis: No Atrophie Blanche: No Ecchymosis: No Cyanosis: No Erythema: No Ecchymosis: No Hemosiderin Staining: No Hemosiderin Staining: No Mottled: No Mottled: No Pallor: No Pallor: No Rubor: No Rubor: No Erythema Location: N/A Circumferential N/A Temperature: No Abnormality No Abnormality No Abnormality Tenderness on Palpation: Yes Yes Yes Wound Preparation: Ulcer Cleansing: Ulcer Cleansing: Ulcer Cleansing: Rinsed/Irrigated with Saline, Rinsed/Irrigated with Saline Rinsed/Irrigated with Saline Other: soap and water Topical Anesthetic Applied: Topical Anesthetic Applied: Topical Anesthetic Applied: Other: lidocaine 4% Other: lidocaine 4% None Procedures Performed: N/A Debridement N/A Treatment Notes Wound #2 (Right Forearm) 1.  Cleansed with: Clean wound with Normal Saline 2. Anesthetic Topical Lidocaine 4% cream to wound bed prior to debridement 4. Dressing Applied: Promogran Rotondo, Cathlin ([id number]) 5. Secondary Dressing Applied Dry Gauze Kerlix/Conform Non-Adherent pad Wound #3 (Right Elbow) 1. Cleansed with: Clean wound with Normal Saline 2. Anesthetic Topical Lidocaine 4% cream to wound bed prior to debridement 4. Dressing Applied: Promogran 5. Secondary Dressing Applied Dry Gauze Kerlix/Conform Non-Adherent pad Electronic Signature(s) Signed: 03/21/2018 7:56:50 AM By: Linton Ham MD Previous Signature: 03/19/2018 5:07:56 PM Version By: Alric Quan Entered By: Linton Ham on 03/21/2018 07:38:50 [name] ([id number]) -------------------------------------------------------------------------------- Multi-Disciplinary Care Plan Details Patient Name: [name] Date of Service: 03/19/2018 2:45 PM Medical Record Number: [id number] Patient Account Number: [id number] Date of Birth/Sex: [birth year] (84 y.o. F) Treating RN: Ahmed Prima Primary Care Kimber Fritts: Park Liter Other Clinician: Referring Kerin Cecchi: Park Liter Treating Jesiah Yerby/Extender: Tito Dine in Treatment: 5 Active Inactive ` Necrotic Tissue Nursing Diagnoses: Impaired tissue integrity related to necrotic/devitalized tissue Goals: Necrotic/devitalized tissue will be minimized in the wound bed Date Initiated: 02/12/2018 Target Resolution Date: 03/07/2018 Goal Status: Active Interventions: Provide  education on necrotic tissue and debridement process Treatment Activities: Apply topical anesthetic as ordered : 02/12/2018 Notes: ` Orientation to the Wound Care Program Nursing Diagnoses: Knowledge deficit related to the wound healing center program Goals: Patient/caregiver will verbalize understanding of the [place] Date Initiated: 02/12/2018 Target Resolution  Date: 03/07/2018 Goal Status: Active Interventions: Provide education on orientation to the wound center Notes: ` Pain, Acute or Chronic Nursing Diagnoses: Potential alteration in comfort, pain Goals: Patient will verbalize adequate pain control and receive pain control interventions during procedures as needed Date Initiated: 02/12/2018 Target Resolution Date: 03/07/2018 ZHOEY, BLACKSTOCK ([id number]) Goal Status: Active Interventions: Reposition patient for comfort Notes: ` Wound/Skin Impairment Nursing Diagnoses: Impaired tissue integrity Goals: Ulcer/skin breakdown will have a volume reduction of 80% by week 12 Date Initiated: 02/12/2018 Target Resolution Date: 05/15/2018 Goal Status: Active Interventions: Assess ulceration(s) every visit Treatment Activities: Patient referred to home care : 02/12/2018 Notes: Electronic Signature(s) Signed: 03/19/2018 5:07:56 PM By: Alric Quan Entered By: Alric Quan on 03/19/2018 15:34:10 [place], Joycelyn Schmid ([id number]) -------------------------------------------------------------------------------- Pain Assessment Details Patient Name: [name] Date of Service: 03/19/2018 2:45 PM Medical Record Number: [id number] Patient Account Number: [id number] Date of Birth/Sex: Jan 28, 1928 (84 y.o. F) Treating RN: Ahmed Prima Primary Care Denesha Brouse: Park Liter Other Clinician: Referring Jina Olenick: Park Liter Treating Chirsty Armistead/Extender: Tito Dine in Treatment: 5 Active Problems Location of Pain Severity and Description of Pain Patient Has Paino No Site Locations Pain Management and Medication Current Pain Management: Electronic Signature(s) Signed: 03/19/2018 5:07:56 PM By: Alric Quan Entered By: Alric Quan on 03/19/2018 14:46:08 [name] ([id number]) -------------------------------------------------------------------------------- Patient/Caregiver Education Details Patient Name:  [name] Date of Service: 03/19/2018 2:45 PM Medical Record Number: [id number] Patient Account Number: [id number] Date of Birth/Gender: 06/04/1928 (84 y.o. F) Treating RN: Montey Hora Primary Care Physician: Park Liter Other Clinician: Referring Physician: Park Liter Treating Physician/Extender: Tito Dine in Treatment: 5 Education Assessment Education Provided To: Patient Education Topics Provided Wound/Skin Impairment: Handouts: Other: wound care as ordered Methods: Demonstration, Explain/Verbal Responses: State content correctly Electronic Signature(s) Signed: 03/19/2018 5:29:35 PM By: Montey Hora Entered By: Montey Hora on 03/19/2018 16:30:43 [name] ([id number]) -------------------------------------------------------------------------------- Wound Assessment Details Patient Name: [name] Date of Service: 03/19/2018 2:45 PM Medical Record Number: [id number] Patient Account Number: [id number] Date of Birth/Sex: November 15, 1927 (84 y.o. F) Treating RN: Ahmed Prima Primary Care Alta Shober: Park Liter Other Clinician: Referring Kao Berkheimer: Park Liter Treating Rehana Uncapher/Extender: Tito Dine in Treatment: 5 Wound Status Wound Number: 1 Primary Trauma, Other Etiology: Wound Location: Left, Lateral Lower Leg Wound Healed - Epithelialized Wounding Event: Trauma Status: Date Acquired: 10/21/2017 Comorbid Cataracts, Glaucoma, Asthma, Chronic Weeks Of Treatment: 5 History: Obstructive Pulmonary Disease (COPD), Clustered Wound: No Arrhythmia, Hypertension Photos Photo Uploaded By: Alric Quan on 03/19/2018 16:34:11 Wound Measurements Length: (cm) 0 Width: (cm) 0 Depth: (cm) 0 Area: (cm) 0 Volume: (cm) 0 % Reduction in Area: 100% % Reduction in Volume: 100% Epithelialization: Small (1-33%) Tunneling: No Undermining: No Wound Description Full Thickness Without Exposed  Support Classification: Structures Wound Margin: Flat and Intact Exudate None Present Amount: Foul Odor After Cleansing: No Slough/Fibrino No Wound Bed Granulation Amount: None Present (0%) Exposed Structure Necrotic Amount: None Present (0%) Fascia Exposed: No Fat Layer (Subcutaneous Tissue) Exposed: No Tendon Exposed: No Muscle Exposed: No Joint Exposed: No Bone Exposed: No Periwound Skin Texture Poage, [place] ([id number]) Texture Color No Abnormalities Noted: No No Abnormalities Noted: No Callus: No Atrophie Blanche: No Crepitus: No Cyanosis: No Excoriation: No  Ecchymosis: No Induration: No Erythema: No Rash: No Hemosiderin Staining: No Scarring: No Mottled: No Pallor: No Moisture Rubor: No No Abnormalities Noted: No Dry / Scaly: No Temperature / Pain Maceration: No Temperature: No Abnormality Tenderness on Palpation: Yes Wound Preparation Ulcer Cleansing: Rinsed/Irrigated with Saline, Other: soap and water, Topical Anesthetic Applied: None Electronic Signature(s) Signed: 03/19/2018 5:07:56 PM By: Alric Quan Entered By: Alric Quan on 03/19/2018 15:30:21 [name] ([id number]) -------------------------------------------------------------------------------- Wound Assessment Details Patient Name: [name] Date of Service: 03/19/2018 2:45 PM Medical Record Number: [id number] Patient Account Number: [id number] Date of Birth/Sex: 05-12-1928 (84 y.o. F) Treating RN: Ahmed Prima Primary Care Kennedie Pardoe: Park Liter Other Clinician: Referring Helaine Yackel: Park Liter Treating Dino Borntreger/Extender: Tito Dine in Treatment: 5 Wound Status Wound Number: 2 Primary Trauma, Other Etiology: Wound Location: Right Forearm Wound Open Wounding Event: Trauma Status: Date Acquired: 03/03/2018 Comorbid Cataracts, Glaucoma, Asthma, Chronic Weeks Of Treatment: 2 History: Obstructive Pulmonary Disease (COPD), Clustered Wound:  No Arrhythmia, Hypertension Photos Photo Uploaded By: Alric Quan on 03/19/2018 16:35:10 Wound Measurements Length: (cm) 2.8 Width: (cm) 1.6 Depth: (cm) 0.1 Area: (cm) 3.519 Volume: (cm) 0.352 % Reduction in Area: -42.2% % Reduction in Volume: -42.5% Epithelialization: None Tunneling: No Undermining: No Wound Description Classification: Partial Thickness Wound Margin: Distinct, outline attached Exudate Amount: Large Exudate Type: Serous Exudate Color: amber Foul Odor After Cleansing: No Slough/Fibrino Yes Wound Bed Granulation Amount: None Present (0%) Exposed Structure Necrotic Amount: Large (67-100%) Fascia Exposed: No Necrotic Quality: Adherent Slough Fat Layer (Subcutaneous Tissue) Exposed: No Tendon Exposed: No Muscle Exposed: No Joint Exposed: No Bone Exposed: No Periwound Skin Texture Eastham, Jaleiah ([id number]) Texture Color No Abnormalities Noted: No No Abnormalities Noted: No Callus: No Atrophie Blanche: No Crepitus: No Cyanosis: No Excoriation: No Ecchymosis: No Induration: No Erythema: Yes Rash: No Erythema Location: Circumferential Scarring: No Hemosiderin Staining: No Mottled: No Moisture Pallor: No No Abnormalities Noted: No Rubor: No Dry / Scaly: No Maceration: No Temperature / Pain Temperature: No Abnormality Tenderness on Palpation: Yes Wound Preparation Ulcer Cleansing: Rinsed/Irrigated with Saline Topical Anesthetic Applied: Other: lidocaine 4%, Treatment Notes Wound #2 (Right Forearm) 1. Cleansed with: Clean wound with Normal Saline 2. Anesthetic Topical Lidocaine 4% cream to wound bed prior to debridement 4. Dressing Applied: Promogran 5. Secondary Dressing Applied Dry Gauze Kerlix/Conform Non-Adherent pad Electronic Signature(s) Signed: 03/19/2018 5:07:56 PM By: Alric Quan Entered By: Alric Quan on 03/19/2018 14:58:27 [place], Joycelyn Schmid  ([id number]) -------------------------------------------------------------------------------- Wound Assessment Details Patient Name: [name] Date of Service: 03/19/2018 2:45 PM Medical Record Number: [id number] Patient Account Number: [id number] Date of Birth/Sex: 11/13/1927 (84 y.o. F) Treating RN: Ahmed Prima Primary Care Honorio Devol: Park Liter Other Clinician: Referring Tal Neer: Park Liter Treating Tailor Lucking/Extender: Tito Dine in Treatment: 5 Wound Status Wound Number: 3 Primary Trauma, Other Etiology: Wound Location: Right Elbow Wound Open Wounding Event: Trauma Status: Date Acquired: 03/17/2018 Comorbid Cataracts, Glaucoma, Asthma, Chronic Weeks Of Treatment: 0 History: Obstructive Pulmonary Disease (COPD), Clustered Wound: No Arrhythmia, Hypertension Photos Photo Uploaded By: Alric Quan on 03/19/2018 16:35:10 Wound Measurements Length: (cm) 0.6 Width: (cm) 1.5 Depth: (cm) 0.1 Area: (cm) 0.707 Volume: (cm) 0.071 % Reduction in Area: % Reduction in Volume: Epithelialization: None Tunneling: No Undermining: No Wound Description Full Thickness Without Exposed Support Classification: Structures Wound Margin: Distinct, outline attached Exudate Large Amount: Exudate Type: Serous Exudate Color: amber Foul Odor After Cleansing: No Slough/Fibrino Yes Wound Bed Granulation Amount: None Present (0%) Exposed Structure Necrotic Amount: Large (67-100%) Fascia Exposed: No Necrotic Quality: Adherent  Slough Fat Layer (Subcutaneous Tissue) Exposed: No Tendon Exposed: No Muscle Exposed: No Joint Exposed: No Bone Exposed: No Marsch, Julieth ([id number]) Periwound Skin Texture Texture Color No Abnormalities Noted: No No Abnormalities Noted: No Moisture Temperature / Pain No Abnormalities Noted: No Temperature: No Abnormality Maceration: Yes Tenderness on Palpation: Yes Wound Preparation Ulcer Cleansing: Rinsed/Irrigated  with Saline Topical Anesthetic Applied: Other: lidocaine 4%, Treatment Notes Wound #3 (Right Elbow) 1. Cleansed with: Clean wound with Normal Saline 2. Anesthetic Topical Lidocaine 4% cream to wound bed prior to debridement 4. Dressing Applied: Promogran 5. Secondary Dressing Applied Dry Gauze Kerlix/Conform Non-Adherent pad Electronic Signature(s) Signed: 03/19/2018 5:07:56 PM By: Alric Quan Entered By: Alric Quan on 03/19/2018 14:57:21 [place], Galadriel ([id number]) -------------------------------------------------------------------------------- Vitals Details Patient Name: [name] Date of Service: 03/19/2018 2:45 PM Medical Record Number: [id number] Patient Account Number: [id number] Date of Birth/Sex: 04/09/1928 (84 y.o. F) Treating RN: Ahmed Prima Primary Care Eliany Mccarter: Park Liter Other Clinician: Referring Payge Eppes: Park Liter Treating Jadan Rouillard/Extender: Tito Dine in Treatment: 5 Vital Signs Time Taken: 15:02 Temperature (F): 97.8 Height (in): 60 Pulse (bpm): 59 Weight (lbs): 94.2 Respiratory Rate (breaths/min): 16 Body Mass Index (BMI): 18.4 Blood Pressure (mmHg): 132/56 Reference Range: 80 - 120 mg / dl Electronic Signature(s) Signed: 03/19/2018 5:07:56 PM By: Alric Quan Entered By: Alric Quan on 03/19/2018 15:02:16

## 2018-03-24 NOTE — Progress Notes (Signed)
AHJANAE, CASSEL ([id number]) Visit Report for 03/19/2018 Debridement Details Patient Name: [name], [name] Date of Service: 03/19/2018 2:45 PM Medical Record Number: [id number] Patient Account Number: [id number] Date of Birth/Sex: May 11, 1928 (84 y.o. F) Treating RN: Montey Hora Primary Care Provider: Park Liter Other Clinician: Referring Provider: Park Liter Treating Provider/Extender: Tito Dine in Treatment: 5 Debridement Performed for Wound #2 Right Forearm Assessment: Performed By: Physician Ricard Dillon, MD Debridement Type: Debridement Pre-procedure Verification/Time Yes - 15:35 Out Taken: Start Time: 15:35 Pain Control: Other : lidocaine 4% Total Area Debrided (L x W): 2.8 (cm) x 1.6 (cm) = 4.48 (cm) Tissue and other material Viable, Non-Viable, Slough, Subcutaneous, Biofilm, Slough debrided: Level: Skin/Subcutaneous Tissue Debridement Description: Excisional Instrument: Curette Bleeding: Moderate Hemostasis Achieved: Silver Nitrate End Time: 15:35 Procedural Pain: 0 Post Procedural Pain: 0 Response to Treatment: Procedure was tolerated well Level of Consciousness: Awake and Alert Post Debridement Measurements of Total Wound Length: (cm) 2.8 Width: (cm) 1.6 Depth: (cm) 0.2 Volume: (cm) 0.704 Character of Wound/Ulcer Post Debridement: Stable Post Procedure Diagnosis Same as Pre-procedure Electronic Signature(s) Signed: 03/21/2018 7:56:50 AM By: Linton Ham MD Signed: 03/24/2018 2:22:32 PM By: Montey Hora Previous Signature: 03/19/2018 5:07:56 PM Version By: Alric Quan Entered By: Linton Ham on 03/21/2018 07:38:59 [name] ([id number]) -------------------------------------------------------------------------------- HPI Details Patient Name: [name] Date of Service: 03/19/2018 2:45 PM Medical Record Number: [id number] Patient Account Number: [id number] Date of Birth/Sex: 1928/04/24 (84 y.o.  F) Treating RN: Montey Hora Primary Care Provider: Park Liter Other Clinician: Referring Provider: Park Liter Treating Provider/Extender: Tito Dine in Treatment: 5 History of Present Illness HPI Description: 02/12/18 ADMISSION This is an 84 year old woman who is recently moved to [place] from [place]. Her story began in February where she fell on some ice suffering extensive lacerations to her bilateral lower extremities. She is able to show me extensive pictures of the right lower leg but with going to a wound care center and [place] 3 times a week these eventually closed over. She has been left with 1 area on the upper lateral left calf. She has been applying Neosporin to this and applying a bandage. Currently this measures 2 x 1.5 cm. The patient is not a diabetic. She is an ex-smoker quitting 40 years ago. She does have COPD by description. ABIs in our clinic were 1.32 on the right and 1.03 on the left 02/19/18; right lower leg wound which was initially trauma. The area that we look that last week is smaller. Still covered in a nonviable surface however. We are using Iodoflex 02/26/18; right lower leg wound which was initially trauma in the setting of chronic venous insufficiency. Surface of the wound looks much better healthy granulation advancing epithelialization. We have good edema control 03/05/18; right lower leg wound which was initially trauma in the setting of chronic venous insufficiency. She continues to make nice progress here. We have good edema control oHe arrives today with a new traumatic laceration on her right dorsal forearm which she states happened while she was putting on a sweatshirt 03/12/18; right lower leg wound as closed however it still looks vulnerable. She has chronic venous insufficiency. oThe new wound from last week a traumatic area on her right dorsal or arm unfortunately does not have a viable surface. I remove some nonviable skin  and necrotic subcutaneous debris from the wound surface. Hemostasis with silver nitrate and direct pressure 03/19/18; right lower leg wound is closed. She has chronic venous insufficiency and will need ongoing  compression oThe new wounds from 2 weeks ago on the right upper elbow is closed. The area on her right dorsal forearm continues to have a nonviable surface requiring debridement Electronic Signature(s) Signed: 03/21/2018 7:56:50 AM By: Linton Ham MD Entered By: Linton Ham on 03/21/2018 07:39:39 [name], [name] ([id number]) -------------------------------------------------------------------------------- Physical Exam Details Patient Name: [name] Date of Service: 03/19/2018 2:45 PM Medical Record Number: [id number] Patient Account Number: [id number] Date of Birth/Sex: 07/15/1928 (84 y.o. F) Treating RN: Montey Hora Primary Care Provider: Park Liter Other Clinician: Referring Provider: Park Liter Treating Provider/Extender: Tito Dine in Treatment: 5 Constitutional Sitting or standing Blood Pressure is within target range for patient.. Pulse regular and within target range for patient.Marland Kitchen Respirations regular, non-labored and within target range.. Temperature is normal and within the target range for the patient.Marland Kitchen appears in no distress. Notes Wound exam oLeft lateral calf wound is closed still vulnerable area in a patient with very frail skin chronic venous insufficiency. She can progress to her own support stocking oRight dorsal forearm still requiring debridement. Hemostasis with direct pressure #5 curet completely nonviable surface covered with necrotic debris oThe area above the right elbow has also closed Electronic Signature(s) Signed: 03/21/2018 7:56:50 AM By: Linton Ham MD Entered By: Linton Ham on 03/21/2018 07:40:48 [name]  ([id number]) -------------------------------------------------------------------------------- Physician Orders Details Patient Name: [name] Date of Service: 03/19/2018 2:45 PM Medical Record Number: [id number] Patient Account Number: [id number] Date of Birth/Sex: October 10, 1927 (84 y.o. F) Treating RN: Ahmed Prima Primary Care Provider: Park Liter Other Clinician: Referring Provider: Park Liter Treating Provider/Extender: Tito Dine in Treatment: 5 Verbal / Phone Orders: No Diagnosis Coding Wound Cleansing Wound #2 Right Forearm o Cleanse wound with mild soap and water Wound #3 Right Elbow o Cleanse wound with mild soap and water Anesthetic (add to Medication List) Wound #2 Right Forearm o Topical Lidocaine 4% cream applied to wound bed prior to debridement (In Clinic Only). Wound #3 Right Elbow o Topical Lidocaine 4% cream applied to wound bed prior to debridement (In Clinic Only). Primary Wound Dressing Wound #2 Right Forearm o Collagen Secondary Dressing Wound #2 Right Forearm o Kerlix and Coban o Non-adherent pad Dressing Change Frequency Wound #2 Right Forearm o Change dressing every week Notes tubigrip on left lower leg and stretch net on right arm Electronic Signature(s) Signed: 03/19/2018 5:07:56 PM By: Alric Quan Signed: 03/21/2018 7:56:50 AM By: Linton Ham MD Entered By: Alric Quan on 03/19/2018 15:39:27 [name] ([id number]) -------------------------------------------------------------------------------- Problem List Details Patient Name: [name] Date of Service: 03/19/2018 2:45 PM Medical Record Number: [id number] Patient Account Number: [id number] Date of Birth/Sex: 09/21/1927 (84 y.o. F) Treating RN: Montey Hora Primary Care Provider: Park Liter Other Clinician: Referring Provider: Park Liter Treating Provider/Extender: Tito Dine in Treatment:  5 Active Problems ICD-10 Evaluated Encounter Code Description Active Date Today Diagnosis L97.222 Non-pressure chronic ulcer of left calf with fat layer exposed 02/12/2018 Yes Yes Status Complications Interventions Medical Improving this areas closed when is fully Decision epithelialized Making : I87.311 Chronic venous hypertension (idiopathic) with ulcer of right 02/12/2018 Yes Yes lower extremity Status Complications Interventions Improving Wounds on the left leg have healed oShe will need compression stockings oWe sent her out with Tubigrip Medical hopefully to maintain Decision edema control to a Making : degree but also protection of her fragile lower extremity skin S81.812D Laceration without foreign body, left lower leg, subsequent 02/12/2018 No Yes encounter S41.111D Laceration without foreign body of right upper arm, 03/05/2018  Yes Yes subsequent encounter Status Complications Interventions Medical No no major improvements debrided of Decision change nonviable skin and Making : necrotic tissue. Inactive Problems Resolved Problems [name], [name] ([id number]) Electronic Signature(s) Signed: 03/21/2018 7:56:50 AM By: Linton Ham MD Entered By: Linton Ham on 03/21/2018 07:43:54 [name] ([id number]) -------------------------------------------------------------------------------- Progress Note Details Patient Name: [name] Date of Service: 03/19/2018 2:45 PM Medical Record Number: [id number] Patient Account Number: [id number] Date of Birth/Sex: 05-15-1928 (84 y.o. F) Treating RN: Montey Hora Primary Care Provider: Park Liter Other Clinician: Referring Provider: Park Liter Treating Provider/Extender: Tito Dine in Treatment: 5 Subjective History of Present Illness (HPI) 02/12/18 ADMISSION This is an 84 year old woman who is recently moved to [place] from [place]. Her story began in February where she fell  on some ice suffering extensive lacerations to her bilateral lower extremities. She is able to show me extensive pictures of the right lower leg but with going to a wound care center and [place] 3 times a week these eventually closed over. She has been left with 1 area on the upper lateral left calf. She has been applying Neosporin to this and applying a bandage. Currently this measures 2 x 1.5 cm. The patient is not a diabetic. She is an ex-smoker quitting 40 years ago. She does have COPD by description. ABIs in our clinic were 1.32 on the right and 1.03 on the left 02/19/18; right lower leg wound which was initially trauma. The area that we look that last week is smaller. Still covered in a nonviable surface however. We are using Iodoflex 02/26/18; right lower leg wound which was initially trauma in the setting of chronic venous insufficiency. Surface of the wound looks much better healthy granulation advancing epithelialization. We have good edema control 03/05/18; right lower leg wound which was initially trauma in the setting of chronic venous insufficiency. She continues to make nice progress here. We have good edema control He arrives today with a new traumatic laceration on her right dorsal forearm which she states happened while she was putting on a sweatshirt 03/12/18; right lower leg wound as closed however it still looks vulnerable. She has chronic venous insufficiency. The new wound from last week a traumatic area on her right dorsal or arm unfortunately does not have a viable surface. I remove some nonviable skin and necrotic subcutaneous debris from the wound surface. Hemostasis with silver nitrate and direct pressure 03/19/18; right lower leg wound is closed. She has chronic venous insufficiency and will need ongoing compression The new wounds from 2 weeks ago on the right upper elbow is closed. The area on her right dorsal forearm continues to have a nonviable surface requiring  debridement Objective Constitutional Sitting or standing Blood Pressure is within target range for patient.. Pulse regular and within target range for patient.Marland Kitchen Respirations regular, non-labored and within target range.. Temperature is normal and within the target range for the patient.Marland Kitchen appears in no distress. Vitals Time Taken: 3:02 PM, Height: 60 in, Weight: 94.2 lbs, BMI: 18.4, Temperature: 97.8 F, Pulse: 59 bpm, Respiratory Rate: 16 breaths/min, Blood Pressure: 132/56 mmHg. General Notes: Wound exam Left lateral calf wound is closed still vulnerable area in a patient with very frail skin chronic [name], [name] ([id number]) venous insufficiency. She can progress to her own support stocking Right dorsal forearm still requiring debridement. Hemostasis with direct pressure #5 curet completely nonviable surface covered with necrotic debris The area above the right elbow has also closed Integumentary (Hair, Skin) Wound #1 status is  Healed - Epithelialized. Original cause of wound was Trauma. The wound is located on the Left,Lateral Lower Leg. The wound measures 0cm length x 0cm width x 0cm depth; 0cm^2 area and 0cm^3 volume. There is no tunneling or undermining noted. There is a none present amount of drainage noted. The wound margin is flat and intact. There is no granulation within the wound bed. There is no necrotic tissue within the wound bed. The periwound skin appearance did not exhibit: Callus, Crepitus, Excoriation, Induration, Rash, Scarring, Dry/Scaly, Maceration, Atrophie Blanche, Cyanosis, Ecchymosis, Hemosiderin Staining, Mottled, Pallor, Rubor, Erythema. Periwound temperature was noted as No Abnormality. The periwound has tenderness on palpation. Wound #2 status is Open. Original cause of wound was Trauma. The wound is located on the Right Forearm. The wound measures 2.8cm length x 1.6cm width x 0.1cm depth; 3.519cm^2 area and 0.352cm^3 volume. There is no tunneling  or undermining noted. There is a large amount of serous drainage noted. The wound margin is distinct with the outline attached to the wound base. There is no granulation within the wound bed. There is a large (67-100%) amount of necrotic tissue within the wound bed including Adherent Slough. The periwound skin appearance exhibited: Erythema. The periwound skin appearance did not exhibit: Callus, Crepitus, Excoriation, Induration, Rash, Scarring, Dry/Scaly, Maceration, Atrophie Blanche, Cyanosis, Ecchymosis, Hemosiderin Staining, Mottled, Pallor, Rubor. The surrounding wound skin color is noted with erythema which is circumferential. Periwound temperature was noted as No Abnormality. The periwound has tenderness on palpation. Wound #3 status is Open. Original cause of wound was Trauma. The wound is located on the Right Elbow. The wound measures 0.6cm length x 1.5cm width x 0.1cm depth; 0.707cm^2 area and 0.071cm^3 volume. There is no tunneling or undermining noted. There is a large amount of serous drainage noted. The wound margin is distinct with the outline attached to the wound base. There is no granulation within the wound bed. There is a large (67-100%) amount of necrotic tissue within the wound bed including Adherent Slough. The periwound skin appearance exhibited: Maceration. Periwound temperature was noted as No Abnormality. The periwound has tenderness on palpation. Assessment Active Problems ICD-10 Non-pressure chronic ulcer of left calf with fat layer exposed Chronic venous hypertension (idiopathic) with ulcer of right lower extremity Laceration without foreign body, left lower leg, subsequent encounter Laceration without foreign body of right upper arm, subsequent encounter Procedures Wound #2 Pre-procedure diagnosis of Wound #2 is a Trauma, Other located on the Right Forearm . There was a Excisional Skin/Subcutaneous Tissue Debridement with a total area of 4.48 sq cm performed by  Ricard Dillon, MD. With the following instrument(s): Curette to remove Viable and Non-Viable tissue/material. Material removed includes Subcutaneous Tissue, Slough, and Biofilm after achieving pain control using Other (lidocaine 4%). No specimens were taken. A time out was conducted at 15:35, prior to the start of the procedure. A Moderate amount of bleeding was controlled with Silver Nitrate. The procedure was tolerated well with a pain level of 0 throughout and a pain level of 0 following the procedure. Patient s Level of Consciousness post procedure was recorded as Awake and Alert. Post Debridement Measurements: 2.8cm length x 1.6cm [name], [name] ([id number]) width x 0.2cm depth; 0.704cm^3 volume. Character of Wound/Ulcer Post Debridement is stable. Post procedure Diagnosis Wound #2: Same as Pre-Procedure Plan Wound Cleansing: Wound #2 Right Forearm: Cleanse wound with mild soap and water Wound #3 Right Elbow: Cleanse wound with mild soap and water Anesthetic (add to Medication List): Wound #2 Right Forearm:  Topical Lidocaine 4% cream applied to wound bed prior to debridement (In Clinic Only). Wound #3 Right Elbow: Topical Lidocaine 4% cream applied to wound bed prior to debridement (In Clinic Only). Primary Wound Dressing: Wound #2 Right Forearm: Collagen Secondary Dressing: Wound #2 Right Forearm: Kerlix and Coban Non-adherent pad Dressing Change Frequency: Wound #2 Right Forearm: Change dressing every week General Notes: tubigrip on left lower leg and stretch net on right arm Medical Decision Making Non-pressure chronic ulcer of left calf with fat layer exposed 02/12/2018 Status: Improving Complications: this areas closed Interventions: when is fully epithelialized Chronic venous hypertension (idiopathic) with ulcer of right lower extremity 02/12/2018 Status: Improving Complications: Wounds on the left leg have healed Interventions: She will need compression  stockings We sent her out with Tubigrip hopefully to maintain edema control to a degree but also protection of her fragile lower extremity skin Laceration without foreign body of right upper arm, subsequent encounter 03/05/2018 Status: No change Complications: no major improvements Interventions: debrided of nonviable skin and necrotic tissue. #1 she will need stockings for the left leg.In the interim we continue to use Tubigri #2 after debridement of the forearm we will continue With collagen Kerlix and [facility]) [name], [name] ([id number]) Signed: 03/21/2018 7:44:17 AM By: Linton Ham MD Entered By: Linton Ham on 03/21/2018 07:44:17 [name] ([id number]) -------------------------------------------------------------------------------- SuperBill Details Patient Name: [name] Date of Service: 03/19/2018 Medical Record Number: [id number] Patient Account Number: [id number] Date of Birth/Sex: July 18, 1928 (84 y.o. F) Treating RN: Montey Hora Primary Care Provider: Park Liter Other Clinician: Referring Provider: Park Liter Treating Provider/Extender: Tito Dine in Treatment: 5 Diagnosis Coding ICD-10 Codes Code Description [phone] Non-pressure chronic ulcer of left calf with fat layer exposed I87.311 Chronic venous hypertension (idiopathic) with ulcer of right lower extremity S81.812D Laceration without foreign body, left lower leg, subsequent encounter S41.111D Laceration without foreign body of right upper arm, subsequent encounter Facility Procedures CPT4 Code: [id number] Description: 11572 - DEB SUBQ TISSUE 20 SQ CM/< ICD-10 Diagnosis Description S41.111D Laceration without foreign body of right upper arm, subsequent Modifier: encounter Quantity: 1 Physician Procedures CPT4 Code: [id number] Description: 74163 - WC PHYS SUBQ TISS 20 SQ CM ICD-10 Diagnosis Description S41.111D Laceration without foreign body of right  upper arm, subsequent Modifier: encounter Quantity: 1 Electronic Signature(s) Signed: 03/21/2018 7:56:50 AM By: Linton Ham MD Entered By: Linton Ham on 03/21/2018 07:45:07

## 2018-03-26 ENCOUNTER — Other Ambulatory Visit: Payer: Medicare Other

## 2018-03-26 ENCOUNTER — Encounter: Payer: Medicare Other | Admitting: Internal Medicine

## 2018-03-26 DIAGNOSIS — L97819 Non-pressure chronic ulcer of other part of right lower leg with unspecified severity: Secondary | ICD-10-CM | POA: Diagnosis not present

## 2018-03-26 DIAGNOSIS — E079 Disorder of thyroid, unspecified: Secondary | ICD-10-CM

## 2018-03-27 ENCOUNTER — Telehealth: Payer: Self-pay | Admitting: Family Medicine

## 2018-03-27 LAB — TSH: TSH: 3.78 u[IU]/mL (ref 0.450–4.500)

## 2018-03-27 MED ORDER — LEVOTHYROXINE SODIUM 100 MCG PO TABS: 100.0000 ug | ORAL_TABLET | Freq: Every day | ORAL | 3 refills | Status: DC

## 2018-03-27 NOTE — Telephone Encounter (Signed)
Please let her know that her thyroid is back to normal. I've sent through refills to her pharmacy. Thanks!

## 2018-03-27 NOTE — Telephone Encounter (Signed)
Patient notified

## 2018-04-02 ENCOUNTER — Encounter: Payer: Medicare Other | Admitting: Internal Medicine

## 2018-04-02 DIAGNOSIS — L97819 Non-pressure chronic ulcer of other part of right lower leg with unspecified severity: Secondary | ICD-10-CM | POA: Diagnosis not present

## 2018-04-09 ENCOUNTER — Encounter: Payer: Medicare Other | Attending: Nurse Practitioner | Admitting: Nurse Practitioner

## 2018-04-09 DIAGNOSIS — I87311 Chronic venous hypertension (idiopathic) with ulcer of right lower extremity: Secondary | ICD-10-CM | POA: Diagnosis not present

## 2018-04-09 DIAGNOSIS — J449 Chronic obstructive pulmonary disease, unspecified: Secondary | ICD-10-CM | POA: Insufficient documentation

## 2018-04-09 DIAGNOSIS — W19XXXD Unspecified fall, subsequent encounter: Secondary | ICD-10-CM | POA: Diagnosis not present

## 2018-04-09 DIAGNOSIS — Z87891 Personal history of nicotine dependence: Secondary | ICD-10-CM | POA: Insufficient documentation

## 2018-04-09 DIAGNOSIS — L97222 Non-pressure chronic ulcer of left calf with fat layer exposed: Secondary | ICD-10-CM | POA: Diagnosis not present

## 2018-04-09 DIAGNOSIS — S41111D Laceration without foreign body of right upper arm, subsequent encounter: Secondary | ICD-10-CM | POA: Diagnosis not present

## 2018-04-13 NOTE — Progress Notes (Signed)
Cardiology Office Note  Date:  04/15/2018   ID:  [name], DOB Mar 19, 1928, MRN [id number]  PCP:  Carol Roys, DO   Chief Complaint  Patient presents with  . other    Afib c/o edema and sob with exertion. Meds reviewed verbally with pt.    HPI:  [name] is a 84 year old woman with past medical history of PSVT/Atrial fib per outside cardiology notes though details unclear Former smoker, COPD, was on oxygen while living in [place] for several years,  rare epistaxis  Former smoker, 25 yrs, stopped in the 1980s HTN Hypothyroid DVT scoliosis Referred by [name]  for atrial fibrillation  Previously followed by cardiology until 08/2015, outside facility, [place], [facility] Records reviewed from outside cardiologist  notes indicatingShe had follow-up for SVT,  Atrial fibrillation was mentioned in the Past medical history but no details provided  she denies any tachycardia or palpitations concerning for arrhythmia in the past several years She does not remember the circumstances when she had atrial fibrillation  No record of any echocardiogram  there was stress test 10 years ago  Was on oxygen in the past, for many years, felt better Since she has moved to the area from [place], not on oxygen Would like oxygen Does not feel at she is able to do as much as she was able to do in the past At rest is able to breathe okay With doing chores around her house is very short of breath has to stop and recover  Previous stress test 07/21/2008  EKG personally reviewed by myself on todays visit  shows Normal sinus rhythm rate 70 bpm no significant ST or T-wave changes  PMH:   has a past medical history of Anxiety, Atrial fibrillation ([place]), Clotting disorder ([place]), COPD (chronic obstructive pulmonary disease) ([place]), Depression, Overactive bladder, and Thyroid disease.  PSH:    Past Surgical History:  Procedure Laterality Date  . ABDOMINAL HYSTERECTOMY    .  BREAST LUMPECTOMY      Current Outpatient Medications  Medication Sig Dispense Refill  . amLODipine (NORVASC) 2.5 MG tablet Take 1 tablet (2.5 mg total) by mouth daily. 90 tablet 1  . aspirin EC 81 MG tablet Take 1 tablet (81 mg total) by mouth daily. 100 tablet 4  . levothyroxine (SYNTHROID, LEVOTHROID) 100 MCG tablet Take 1 tablet (100 mcg total) by mouth daily before breakfast. 90 tablet 3  . metoprolol tartrate (LOPRESSOR) 25 MG tablet Take 1 tablet (25 mg total) by mouth 2 (two) times daily. 180 tablet 1  . metoprolol tartrate (LOPRESSOR) 50 MG tablet Take 1 tablet (50 mg total) by mouth 2 (two) times daily. 180 tablet 1  . solifenacin (VESICARE) 10 MG tablet Take 1 tablet (10 mg total) by mouth daily. 90 tablet 1   No current facility-administered medications for this visit.      Allergies:   Patient has no known allergies.   Social History:  The patient  reports that she has quit smoking. Her smoking use included cigarettes. She quit after 25.00 years of use. She has never used smokeless tobacco. She reports that she does not drink alcohol or use drugs.   Family History:   family history includes Heart disease in her mother, sister, and son; Heart failure in her mother; Parkinson's disease in her father.    Review of Systems: Review of Systems  Constitutional: Negative.   Respiratory: Negative.   Cardiovascular: Negative.   Gastrointestinal: Negative.   Musculoskeletal: Negative.  Neurological: Negative.   Psychiatric/Behavioral: Negative.   All other systems reviewed and are negative.    PHYSICAL EXAM: VS:  BP 127/61 (BP Location: Right Arm, Patient Position: Sitting, Cuff Size: Normal)   Pulse 70   Ht 5\' 2"  (1.575 m)   Wt 89 lb 12 oz (40.7 kg)   BMI 16.42 kg/m  , BMI Body mass index is 16.42 kg/m. GEN: pale, very thin,  in no acute distress  HEENT: normal  Neck: no JVD, carotid bruits, or masses Cardiac: RRR; no murmurs, rubs, or gallops,no edema   Respiratory:  clear to auscultation bilaterally, normal work of breathing GI: soft, nontender, nondistended, + BS MS: no deformity or atrophy  Skin: warm and dry, no rash Neuro:  Strength and sensation are intact Psych: euthymic mood, full affect   Recent Labs: 02/07/2018: ALT 11; BUN 17; Creatinine, Ser 1.17; Hemoglobin 13.6; Platelets 285; Potassium 5.0; Sodium 138 03/26/2018: TSH 3.780    Lipid Panel Lab Results  Component Value Date   CHOL 180 02/07/2018   HDL 60 02/07/2018   LDLCALC 94 02/07/2018   TRIG 132 02/07/2018      Wt Readings from Last 3 Encounters:  04/15/18 89 lb 12 oz (40.7 kg)  02/07/18 94 lb 4 oz (42.8 kg)       ASSESSMENT AND PLAN:  Paroxysmal atrial fibrillation ([place]) -  Outside records reviewed with no clear documentation of her atrial fibrillation Suspect many years ago Not on anticoagulation given no clear documentation.  SVT (supraventricular tachycardia) ([place]) Documented an outside cardiology note She denies any recent symptoms over the past several years She has been maintained on metoprolol 75 mg twice a day  Centrilobular emphysema ([place]) Long history of smoking Chronic shortness of breath Was on oxygen for many years when she lived in [place] Is requesting oxygen given worsening symptoms with any exertion Recommended she establish with pulmonary  Essential hypertension - Blood pressure is well controlled on today's visit. No changes made to the medications.  Hypothyroidism, unspecified type Managed by primary care  Shortness of breath Chronic issue for the past several years She denies any significant change Suspect secondary to COPD, deconditioning, possibly exacerbated by scoliosis and kyphosis Echocardiogram ordered to rule out pulmonary hypertension  Disposition:   F/U  As needed  Records reviewed and discussed with patient and family who presents with her today  Total encounter time more than 60 minutes  Greater than 50%  was spent in counseling and coordination of care with the patient    Orders Placed This Encounter  Procedures  . Ambulatory referral to Pulmonology  . EKG 12-Lead  . ECHOCARDIOGRAM COMPLETE     Signed, Esmond Plants, M.D., Ph.D. 04/15/2018  [place], [place]

## 2018-04-15 ENCOUNTER — Ambulatory Visit (INDEPENDENT_AMBULATORY_CARE_PROVIDER_SITE_OTHER): Payer: Medicare Other | Admitting: Cardiovascular Disease

## 2018-04-15 ENCOUNTER — Encounter: Payer: Self-pay | Admitting: Cardiovascular Disease

## 2018-04-15 VITALS — BP 127/61 | HR 70 | Ht 62.0 in | Wt 89.8 lb

## 2018-04-15 DIAGNOSIS — R0602 Shortness of breath: Secondary | ICD-10-CM

## 2018-04-15 DIAGNOSIS — I480 Paroxysmal atrial fibrillation: Secondary | ICD-10-CM | POA: Diagnosis not present

## 2018-04-15 DIAGNOSIS — J432 Centrilobular emphysema: Secondary | ICD-10-CM

## 2018-04-15 DIAGNOSIS — I10 Essential (primary) hypertension: Secondary | ICD-10-CM

## 2018-04-15 DIAGNOSIS — I471 Supraventricular tachycardia: Secondary | ICD-10-CM | POA: Diagnosis not present

## 2018-04-15 DIAGNOSIS — E039 Hypothyroidism, unspecified: Secondary | ICD-10-CM

## 2018-04-15 NOTE — Patient Instructions (Addendum)
Referral to pulmonary, Dr. Juanell Fairly   Medication Instructions:   No medication changes made  Labwork:  No new labs needed  Testing/Procedures:  We will schedule an echocardiogram for shortness of breath, atrial fib/SVT   Follow-Up: It was a pleasure seeing you in the office today. Please call us if you have new issues that need to be addressed before your next appt.  [phone]  Your physician wants you to follow-up in:  As needed  If you need a refill on your cardiac medications before your next appointment, please call your pharmacy.  For educational health videos Log in to : [email or web address] Or : [email or web address], password : [email or web address]

## 2018-04-16 ENCOUNTER — Encounter: Payer: Medicare Other | Admitting: Nurse Practitioner

## 2018-04-16 DIAGNOSIS — S41111D Laceration without foreign body of right upper arm, subsequent encounter: Secondary | ICD-10-CM | POA: Diagnosis not present

## 2018-04-16 NOTE — Progress Notes (Signed)
[name], [name] ([id number]) Visit Report for 04/02/2018 Arrival Information Details Patient Name: [name], [name] Date of Service: 04/02/2018 3:45 PM Medical Record Number: [id number] Patient Account Number: [id number] Date of Birth/Sex: 15-Oct-1927 (84 y.o. F) Treating RN: Ahmed Prima Primary Care Tearra Ouk: Park Liter Other Clinician: Referring Janayla Marik: Park Liter Treating Kirsten Mckone/Extender: Tito Dine in Treatment: 7 Visit Information History Since Last Visit All ordered tests and consults were completed: No Patient Arrived: Ambulatory Added or deleted any medications: No Arrival Time: 16:08 Any new allergies or adverse reactions: No Accompanied By: daughter Had a fall or experienced change in No Transfer Assistance: None activities of daily living that may affect Patient Identification Verified: Yes risk of falls: Secondary Verification Process Completed: Yes Signs or symptoms of abuse/neglect since last visito No Patient Requires Transmission-Based No Hospitalized since last visit: No Precautions: Implantable device outside of the clinic excluding No Patient Has Alerts: No cellular tissue based products placed in the center since last visit: Has Dressing in Place as Prescribed: Yes Pain Present Now: Yes Electronic Signature(s) Signed: 04/07/2018 4:59:33 PM By: Alric Quan Entered By: Alric Quan on 04/02/2018 16:08:36 [name] ([id number]) -------------------------------------------------------------------------------- Clinic Level of Care Assessment Details Patient Name: [name] Date of Service: 04/02/2018 3:45 PM Medical Record Number: [id number] Patient Account Number: [id number] Date of Birth/Sex: 27-Aug-1928 (84 y.o. F) Treating RN: [name] Barman Primary Care Lucia Mccreadie: Park Liter Other Clinician: Referring Alize Acy: Park Liter Treating Tanaisha Pittman/Extender: Tito Dine in Treatment: 7 Clinic  Level of Care Assessment Items TOOL 4 Quantity Score []  - Use when only an EandM is performed on FOLLOW-UP visit 0 ASSESSMENTS - Nursing Assessment / Reassessment []  - Reassessment of Co-morbidities (includes updates in patient status) 0 X- 1 5 Reassessment of Adherence to Treatment Plan ASSESSMENTS - Wound and Skin Assessment / Reassessment []  - Simple Wound Assessment / Reassessment - one wound 0 X- 2 5 Complex Wound Assessment / Reassessment - multiple wounds []  - 0 Dermatologic / Skin Assessment (not related to wound area) ASSESSMENTS - Focused Assessment []  - Circumferential Edema Measurements - multi extremities 0 []  - 0 Nutritional Assessment / Counseling / Intervention []  - 0 Lower Extremity Assessment (monofilament, tuning fork, pulses) []  - 0 Peripheral Arterial Disease Assessment (using hand held doppler) ASSESSMENTS - Ostomy and/or Continence Assessment and Care []  - Incontinence Assessment and Management 0 []  - 0 Ostomy Care Assessment and Management (repouching, etc.) PROCESS - Coordination of Care X - Simple Patient / Family Education for ongoing care 1 15 []  - 0 Complex (extensive) Patient / Family Education for ongoing care []  - 0 Staff obtains Programmer, systems, Records, Test Results / Process Orders []  - 0 Staff telephones HHA, Nursing Homes / Clarify orders / etc []  - 0 Routine Transfer to another Facility (non-emergent condition) []  - 0 Routine Hospital Admission (non-emergent condition) []  - 0 New Admissions / Biomedical engineer / Ordering NPWT, Apligraf, etc. []  - 0 Emergency Hospital Admission (emergent condition) X- 1 10 Simple Discharge Coordination Fye, March ([id number]) []  - 0 Complex (extensive) Discharge Coordination PROCESS - Special Needs []  - Pediatric / Minor Patient Management 0 []  - 0 Isolation Patient Management []  - 0 Hearing / Language / Visual special needs []  - 0 Assessment of Community assistance (transportation, D/C  planning, etc.) []  - 0 Additional assistance / Altered mentation []  - 0 Support Surface(s) Assessment (bed, cushion, seat, etc.) INTERVENTIONS - Wound Cleansing / Measurement []  - Simple Wound Cleansing - one wound 0 []  - 0 Complex Wound  Cleansing - multiple wounds X- 1 5 Wound Imaging (photographs - any number of wounds) []  - 0 Wound Tracing (instead of photographs) X- 1 5 Simple Wound Measurement - one wound []  - 0 Complex Wound Measurement - multiple wounds INTERVENTIONS - Wound Dressings []  - Small Wound Dressing one or multiple wounds 0 X- 1 15 Medium Wound Dressing one or multiple wounds []  - 0 Large Wound Dressing one or multiple wounds []  - 0 Application of Medications - topical []  - 0 Application of Medications - injection INTERVENTIONS - Miscellaneous []  - External ear exam 0 []  - 0 Specimen Collection (cultures, biopsies, blood, body fluids, etc.) []  - 0 Specimen(s) / Culture(s) sent or taken to Lab for analysis []  - 0 Patient Transfer (multiple staff / Civil Service fast streamer / Similar devices) []  - 0 Simple Staple / Suture removal (25 or less) []  - 0 Complex Staple / Suture removal (26 or more) []  - 0 Hypo / Hyperglycemic Management (close monitor of Blood Glucose) []  - 0 Ankle / Brachial Index (ABI) - do not check if billed separately X- 1 5 Vital Signs [name], [name] ([id number]) Has the patient been seen at the hospital within the last three years: Yes Total Score: 70 Level Of Care: New/Established - Level 2 Electronic Signature(s) Signed: 04/07/2018 10:49:10 AM By: Gretta Cool, BSN, RN, CWS, Kim RN, BSN Entered By: Gretta Cool, BSN, RN, CWS, Kim on 04/07/2018 10:45:58 [name] ([id number]) -------------------------------------------------------------------------------- Lower Extremity Assessment Details Patient Name: [name] Date of Service: 04/02/2018 3:45 PM Medical Record Number: [id number] Patient Account Number: [id number] Date of Birth/Sex:  11-Sep-1927 (84 y.o. F) Treating RN: Ahmed Prima Primary Care Estefano Victory: Park Liter Other Clinician: Referring Frederica Chrestman: Park Liter Treating Mayra Jolliffe/Extender: Tito Dine in Treatment: 7 Electronic Signature(s) Signed: 04/07/2018 4:59:33 PM By: Alric Quan Entered By: Alric Quan on 04/02/2018 16:20:45 [name] ([id number]) -------------------------------------------------------------------------------- Multi Wound Chart Details Patient Name: [name] Date of Service: 04/02/2018 3:45 PM Medical Record Number: [id number] Patient Account Number: [id number] Date of Birth/Sex: 10-04-1927 (84 y.o. F) Treating RN: [name] Barman Primary Care Diannie Willner: Park Liter Other Clinician: Referring Kissa Campoy: Park Liter Treating Daegen Berrocal/Extender: Tito Dine in Treatment: 7 Vital Signs Height(in): 60 Pulse(bpm): 4 Weight(lbs): 94.2 Blood Pressure(mmHg): 134/58 Body Mass Index(BMI): 18 Temperature(F): 97.6 Respiratory Rate 18 (breaths/min): Photos: [2:No Photos] [3:No Photos] [N/A:N/A] Wound Location: [2:Right Forearm] [3:Right Elbow] [N/A:N/A] Wounding Event: [2:Trauma] [3:Trauma] [N/A:N/A] Primary Etiology: [2:Trauma, Other] [3:Trauma, Other] [N/A:N/A] Comorbid History: [2:Cataracts, Glaucoma, Asthma, Cataracts, Glaucoma, Asthma, N/A Chronic Obstructive Pulmonary Disease (COPD), Pulmonary Disease (COPD), Arrhythmia, Hypertension] [3:Chronic Obstructive Arrhythmia, Hypertension] Date Acquired: [2:03/03/2018] [3:03/17/2018] [N/A:N/A] Weeks of Treatment: [2:4] [3:2] [N/A:N/A] Wound Status: [2:Open] [3:Open] [N/A:N/A] Measurements L x W x D [2:0.5x1x0.1] [3:1.9x0.9x0.1] [N/A:N/A] (cm) Area (cm) : [2:0.393] [3:1.343] [N/A:N/A] Volume (cm) : [2:0.039] [3:0.134] [N/A:N/A] % Reduction in Area: [2:84.10%] [3:-90.00%] [N/A:N/A] % Reduction in Volume: [2:84.20%] [3:-88.70%] [N/A:N/A] Classification: [2:Partial Thickness] [3:Full  Thickness Without Exposed Support Structures] [N/A:N/A] Exudate Amount: [2:Medium] [3:Large] [N/A:N/A] Exudate Type: [2:Serosanguineous] [3:Serosanguineous] [N/A:N/A] Exudate Color: [2:red, brown] [3:red, brown] [N/A:N/A] Wound Margin: [2:Distinct, outline attached] [3:Distinct, outline attached] [N/A:N/A] Granulation Amount: [2:Large (67-100%)] [3:Large (67-100%)] [N/A:N/A] Granulation Quality: [2:Red] [3:Red] [N/A:N/A] Necrotic Amount: [2:Small (1-33%)] [3:Small (1-33%)] [N/A:N/A] Exposed Structures: [2:Fascia: No Fat Layer (Subcutaneous Tissue) Exposed: No Tendon: No Muscle: No Joint: No Bone: No] [3:Fascia: No Fat Layer (Subcutaneous Tissue) Exposed: No Tendon: No Muscle: No Joint: No Bone: No] [N/A:N/A] Epithelialization: [2:None] [3:None] [N/A:N/A] Periwound Skin Texture: [2:Excoriation: No Induration: No Callus: No] [3:No Abnormalities  Noted] [N/A:N/A] Crepitus: No Rash: No Scarring: No Periwound Skin Moisture: Maceration: No Maceration: Yes N/A Dry/Scaly: No Periwound Skin Color: Erythema: Yes No Abnormalities Noted N/A Atrophie Blanche: No Cyanosis: No Ecchymosis: No Hemosiderin Staining: No Mottled: No Pallor: No Rubor: No Erythema Location: Circumferential N/A N/A Temperature: No Abnormality No Abnormality N/A Tenderness on Palpation: Yes Yes N/A Wound Preparation: Ulcer Cleansing: Ulcer Cleansing: N/A Rinsed/Irrigated with Saline Rinsed/Irrigated with Saline Topical Anesthetic Applied: Topical Anesthetic Applied: Other: lidocaine 4% Other: lidocaine 4% Treatment Notes Electronic Signature(s) Signed: 04/02/2018 6:17:43 PM By: Linton Ham MD Entered By: Linton Ham on 04/02/2018 18:12:19 [name], [name] ([id number]) -------------------------------------------------------------------------------- Multi-Disciplinary Care Plan Details Patient Name: [name] Date of Service: 04/02/2018 3:45 PM Medical Record Number: [id number] Patient Account  Number: [id number] Date of Birth/Sex: 02/09/1928 (84 y.o. F) Treating RN: [name] Barman Primary Care Deklynn Charlet: Park Liter Other Clinician: Referring Abdiaziz Klahn: Park Liter Treating Rohini Jaroszewski/Extender: Tito Dine in Treatment: 7 Active Inactive ` Necrotic Tissue Nursing Diagnoses: Impaired tissue integrity related to necrotic/devitalized tissue Goals: Necrotic/devitalized tissue will be minimized in the wound bed Date Initiated: 02/12/2018 Target Resolution Date: 03/07/2018 Goal Status: Active Interventions: Provide education on necrotic tissue and debridement process Treatment Activities: Apply topical anesthetic as ordered : 02/12/2018 Notes: ` Orientation to the Wound Care Program Nursing Diagnoses: Knowledge deficit related to the wound healing center program Goals: Patient/caregiver will verbalize understanding of the [facility] Date Initiated: 02/12/2018 Target Resolution Date: 03/07/2018 Goal Status: Active Interventions: Provide education on orientation to the wound center Notes: ` Pain, Acute or Chronic Nursing Diagnoses: Potential alteration in comfort, pain Goals: Patient will verbalize adequate pain control and receive pain control interventions during procedures as needed Date Initiated: 02/12/2018 Target Resolution Date: 03/07/2018 [name], [name] ([id number]) Goal Status: Active Interventions: Reposition patient for comfort Notes: ` Wound/Skin Impairment Nursing Diagnoses: Impaired tissue integrity Goals: Ulcer/skin breakdown will have a volume reduction of 80% by week 12 Date Initiated: 02/12/2018 Target Resolution Date: 05/15/2018 Goal Status: Active Interventions: Assess ulceration(s) every visit Treatment Activities: Patient referred to home care : 02/12/2018 Notes: Electronic Signature(s) Signed: 04/04/2018 6:17:48 PM By: Gretta Cool, BSN, RN, CWS, Kim RN, BSN Entered By: Gretta Cool, BSN, RN, CWS, Kim on 04/02/2018  16:40:46 [name], [name] ([id number]) -------------------------------------------------------------------------------- Pain Assessment Details Patient Name: [name] Date of Service: 04/02/2018 3:45 PM Medical Record Number: [id number] Patient Account Number: [id number] Date of Birth/Sex: Jan 25, 1928 (84 y.o. F) Treating RN: Ahmed Prima Primary Care Illa Enlow: Park Liter Other Clinician: Referring Elijan Googe: Park Liter Treating Aleja Yearwood/Extender: Tito Dine in Treatment: 7 Active Problems Location of Pain Severity and Description of Pain Patient Has Paino Yes Site Locations Pain Location: Pain in Ulcers Rate the pain. Current Pain Level: 5 Character of Pain Describe the Pain: Aching Pain Management and Medication Current Pain Management: Electronic Signature(s) Signed: 04/07/2018 4:59:33 PM By: Alric Quan Entered By: Alric Quan on 04/02/2018 16:08:54 [name] ([id number]) -------------------------------------------------------------------------------- Wound Assessment Details Patient Name: [name] Date of Service: 04/02/2018 3:45 PM Medical Record Number: [id number] Patient Account Number: [id number] Date of Birth/Sex: [birth year] (84 y.o. F) Treating RN: Ahmed Prima Primary Care Devon Pretty: Park Liter Other Clinician: Referring Marasia Newhall: Park Liter Treating Marlisa Caridi/Extender: Tito Dine in Treatment: 7 Wound Status Wound Number: 2 Primary Trauma, Other Etiology: Wound Location: Right Forearm Wound Open Wounding Event: Trauma Status: Date Acquired: 03/03/2018 Comorbid Cataracts, Glaucoma, Asthma, Chronic Weeks Of Treatment: 4 History: Obstructive Pulmonary Disease (COPD), Clustered Wound: No Arrhythmia, Hypertension Photos Photo Uploaded By: Alric Quan on  04/03/2018 07:39:33 Wound Measurements Length: (cm) 0.5 Width: (cm) 1 Depth: (cm) 0.1 Area: (cm) 0.393 Volume: (cm)  0.039 % Reduction in Area: 84.1% % Reduction in Volume: 84.2% Epithelialization: None Tunneling: No Undermining: No Wound Description Classification: Partial Thickness Wound Margin: Distinct, outline attached Exudate Amount: Medium Exudate Type: Serosanguineous Exudate Color: red, brown Foul Odor After Cleansing: No Slough/Fibrino Yes Wound Bed Granulation Amount: Large (67-100%) Exposed Structure Granulation Quality: Red Fascia Exposed: No Necrotic Amount: Small (1-33%) Fat Layer (Subcutaneous Tissue) Exposed: No Necrotic Quality: Adherent Slough Tendon Exposed: No Muscle Exposed: No Joint Exposed: No Bone Exposed: No Periwound Skin Texture [name], [name] ([id number]) Texture Color No Abnormalities Noted: No No Abnormalities Noted: No Callus: No Atrophie Blanche: No Crepitus: No Cyanosis: No Excoriation: No Ecchymosis: No Induration: No Erythema: Yes Rash: No Erythema Location: Circumferential Scarring: No Hemosiderin Staining: No Mottled: No Moisture Pallor: No No Abnormalities Noted: No Rubor: No Dry / Scaly: No Maceration: No Temperature / Pain Temperature: No Abnormality Tenderness on Palpation: Yes Wound Preparation Ulcer Cleansing: Rinsed/Irrigated with Saline Topical Anesthetic Applied: Other: lidocaine 4%, Electronic Signature(s) Signed: 04/07/2018 4:59:33 PM By: Alric Quan Entered By: Alric Quan on 04/02/2018 16:17:23 [name], [name] ([id number]) -------------------------------------------------------------------------------- Wound Assessment Details Patient Name: [name] Date of Service: 04/02/2018 3:45 PM Medical Record Number: [id number] Patient Account Number: [id number] Date of Birth/Sex: [birth year] (84 y.o. F) Treating RN: Ahmed Prima Primary Care Chenika Nevils: Park Liter Other Clinician: Referring Mirian Casco: Park Liter Treating Shaterra Sanzone/Extender: Tito Dine in Treatment: 7 Wound  Status Wound Number: 3 Primary Trauma, Other Etiology: Wound Location: Right Elbow Wound Open Wounding Event: Trauma Status: Date Acquired: 03/17/2018 Comorbid Cataracts, Glaucoma, Asthma, Chronic Weeks Of Treatment: 2 History: Obstructive Pulmonary Disease (COPD), Clustered Wound: No Arrhythmia, Hypertension Photos Photo Uploaded By: Alric Quan on 04/03/2018 07:39:34 Wound Measurements Length: (cm) 1.9 Width: (cm) 0.9 Depth: (cm) 0.1 Area: (cm) 1.343 Volume: (cm) 0.134 % Reduction in Area: -90% % Reduction in Volume: -88.7% Epithelialization: None Tunneling: No Undermining: No Wound Description Full Thickness Without Exposed Support Classification: Structures Wound Margin: Distinct, outline attached Exudate Large Amount: Exudate Type: Serosanguineous Exudate Color: red, brown Foul Odor After Cleansing: No Slough/Fibrino Yes Wound Bed Granulation Amount: Large (67-100%) Exposed Structure Granulation Quality: Red Fascia Exposed: No Necrotic Amount: Small (1-33%) Fat Layer (Subcutaneous Tissue) Exposed: No Necrotic Quality: Adherent Slough Tendon Exposed: No Muscle Exposed: No Joint Exposed: No Bone Exposed: No [name], [name] ([id number]) Periwound Skin Texture Texture Color No Abnormalities Noted: No No Abnormalities Noted: No Moisture Temperature / Pain No Abnormalities Noted: No Temperature: No Abnormality Maceration: Yes Tenderness on Palpation: Yes Wound Preparation Ulcer Cleansing: Rinsed/Irrigated with Saline Topical Anesthetic Applied: Other: lidocaine 4%, Electronic Signature(s) Signed: 04/07/2018 4:59:33 PM By: Alric Quan Entered By: Alric Quan on 04/02/2018 16:18:21 [name], [name] ([id number]) -------------------------------------------------------------------------------- Vitals Details Patient Name: [name] Date of Service: 04/02/2018 3:45 PM Medical Record Number: [id number] Patient Account Number:  [id number] Date of Birth/Sex: [birth year] (84 y.o. F) Treating RN: Ahmed Prima Primary Care Halen Antenucci: Park Liter Other Clinician: Referring Parminder Trapani: Park Liter Treating Kimetha Trulson/Extender: Tito Dine in Treatment: 7 Vital Signs Time Taken: 16:08 Temperature (F): 97.6 Height (in): 60 Pulse (bpm): 74 Weight (lbs): 94.2 Respiratory Rate (breaths/min): 18 Body Mass Index (BMI): 18.4 Blood Pressure (mmHg): 134/58 Reference Range: 80 - 120 mg / dl Electronic Signature(s) Signed: 04/07/2018 4:59:33 PM By: Alric Quan Entered By: Alric Quan on 04/02/2018 16:11:13

## 2018-04-20 NOTE — Progress Notes (Signed)
[name], [name] ([id number]) Visit Report for 04/09/2018 Chief Complaint Document Details Patient Name: [name], [name] Date of Service: 04/09/2018 1:45 PM Medical Record Number: [id number] Patient Account Number: [id number] Date of Birth/Sex: 1928-01-10 (84 y.o. F) Treating RN: [name] Barman Primary Care Provider: Park Liter Other Clinician: Referring Provider: Park Liter Treating Provider/Extender: Cathie Olden in Treatment: 8 Information Obtained from: Patient Chief Complaint RFA wound Electronic Signature(s) Signed: 04/09/2018 2:42:09 PM By: Lawanda Cousins Entered By: Lawanda Cousins on 04/09/2018 14:42:09 [name], [name] ([id number]) -------------------------------------------------------------------------------- HPI Details Patient Name: [name] Date of Service: 04/09/2018 1:45 PM Medical Record Number: [id number] Patient Account Number: [id number] Date of Birth/Sex: February 18, 1928 (84 y.o. F) Treating RN: [name] Barman Primary Care Provider: Park Liter Other Clinician: Referring Provider: Park Liter Treating Provider/Extender: Cathie Olden in Treatment: 8 History of Present Illness HPI Description: 02/12/18 ADMISSION This is an 84 year old woman who is recently moved to [place] from [place]. Her story began in February where she fell on some ice suffering extensive lacerations to her bilateral lower extremities. She is able to show me extensive pictures of the right lower leg but with going to a wound care center and [place] 3 times a week these eventually closed over. She has been left with 1 area on the upper lateral left calf. She has been applying Neosporin to this and applying a bandage. Currently this measures 2 x 1.5 cm. The patient is not a diabetic. She is an ex-smoker quitting 40 years ago. She does have COPD by description. ABIs in our clinic were 1.32 on the right and 1.03 on the left 02/19/18; right lower leg wound which was  initially trauma. The area that we look that last week is smaller. Still covered in a nonviable surface however. We are using Iodoflex 02/26/18; right lower leg wound which was initially trauma in the setting of chronic venous insufficiency. Surface of the wound looks much better healthy granulation advancing epithelialization. We have good edema control 03/05/18; right lower leg wound which was initially trauma in the setting of chronic venous insufficiency. She continues to make nice progress here. We have good edema control oHe arrives today with a new traumatic laceration on her right dorsal forearm which she states happened while she was putting on a sweatshirt 03/12/18; right lower leg wound as closed however it still looks vulnerable. She has chronic venous insufficiency. oThe new wound from last week a traumatic area on her right dorsal or arm unfortunately does not have a viable surface. I remove some nonviable skin and necrotic subcutaneous debris from the wound surface. Hemostasis with silver nitrate and direct pressure 03/19/18; right lower leg wound is closed. She has chronic venous insufficiency and will need ongoing compression oThe new wounds from 2 weeks ago on the right upper elbow is closed. The area on her right dorsal forearm continues to have a nonviable surface requiring debridement 03/26/18; we'll need to look into ongoing compression for her legs. 2 wounds on the right upper elbow is closed. The area on the right dorsal forearm looks better. Hydrofera Blue secondary to hypertrophic granulation 04/02/18; he has compression stockings although she did not wear them today. Severe chronic venous insufficiency. She has 2 wounds on the right upper elbow which I said were closed last week which actually are although they're very small and she has a smaller clean wound on the right dorsal forearm. We've been using Hydrofera Blue secondary to some upper granulation. All of this looks  better 04/09/18-She is seen in  follow up evaluation for a right elbow and right forearm skin tear. The right elbow is healed. We will continue with same treatment plan and she'll follow next week Electronic Signature(s) Signed: 04/09/2018 2:42:43 PM By: Lawanda Cousins Entered By: Lawanda Cousins on 04/09/2018 14:42:43 [name], [name] ([id number]) -------------------------------------------------------------------------------- Physician Orders Details Patient Name: [name] Date of Service: 04/09/2018 1:45 PM Medical Record Number: [id number] Patient Account Number: [id number] Date of Birth/Sex: [birth year] (84 y.o. F) Treating RN: [name] Barman Primary Care Provider: Park Liter Other Clinician: Referring Provider: Park Liter Treating Provider/Extender: Cathie Olden in Treatment: 8 Verbal / Phone Orders: No Diagnosis Coding Wound Cleansing Wound #2 Right Forearm o Clean wound with Normal Saline. Primary Wound Dressing o Mepitel One Contact layer o Hydrafera Blue Ready Transfer Secondary Dressing Wound #2 Right Forearm o Boardered Foam Dressing - secured with stretchnet Dressing Change Frequency Wound #2 Right Forearm o Other: - Keep dressing in place all week. Follow-up Appointments Wound #2 Right Forearm o Return Appointment in 1 week. Electronic Signature(s) Signed: 04/09/2018 4:31:15 PM By: Lawanda Cousins Signed: 04/09/2018 5:11:54 PM By: Gretta Cool, BSN, RN, CWS, Kim RN, BSN Entered By: Gretta Cool, BSN, RN, CWS, Kim on 04/09/2018 14:18:53 [name], [name] ([id number]) -------------------------------------------------------------------------------- Problem List Details Patient Name: [name] Date of Service: 04/09/2018 1:45 PM Medical Record Number: [id number] Patient Account Number: [id number] Date of Birth/Sex: 17-Jun-1928 (84 y.o. F) Treating RN: [name] Barman Primary Care Provider: Park Liter Other Clinician: Referring Provider: Park Liter Treating Provider/Extender: Cathie Olden in Treatment: 8 Active Problems ICD-10 Evaluated Encounter Code Description Active Date Today Diagnosis S41.111D Laceration without foreign body of right upper arm, 03/05/2018 No Yes subsequent encounter Inactive Problems Resolved Problems ICD-10 Code Description Active Date Resolved Date L97.222 Non-pressure chronic ulcer of left calf with fat layer exposed 02/12/2018 02/12/2018 I87.311 Chronic venous hypertension (idiopathic) with ulcer of right lower 02/12/2018 02/12/2018 extremity S81.812D Laceration without foreign body, left lower leg, subsequent 02/12/2018 02/12/2018 encounter Electronic Signature(s) Signed: 04/09/2018 2:41:49 PM By: Lawanda Cousins Entered By: Lawanda Cousins on 04/09/2018 14:41:49 [name], [name] ([id number]) -------------------------------------------------------------------------------- Progress Note Details Patient Name: [name] Date of Service: 04/09/2018 1:45 PM Medical Record Number: [id number] Patient Account Number: [id number] Date of Birth/Sex: 10/31/1927 (84 y.o. F) Treating RN: [name] Barman Primary Care Provider: Park Liter Other Clinician: Referring Provider: Park Liter Treating Provider/Extender: Cathie Olden in Treatment: 8 Subjective Chief Complaint Information obtained from Patient RFA wound History of Present Illness (HPI) 02/12/18 ADMISSION This is an 84 year old woman who is recently moved to [place] from [place]. Her story began in February where she fell on some ice suffering extensive lacerations to her bilateral lower extremities. She is able to show me extensive pictures of the right lower leg but with going to a wound care center and [place] 3 times a week these eventually closed over. She has been left with 1 area on the upper lateral left calf. She has been applying Neosporin to this and applying a bandage. Currently this measures 2 x 1.5 cm. The patient is  not a diabetic. She is an ex-smoker quitting 40 years ago. She does have COPD by description. ABIs in our clinic were 1.32 on the right and 1.03 on the left 02/19/18; right lower leg wound which was initially trauma. The area that we look that last week is smaller. Still covered in a nonviable surface however. We are using Iodoflex 02/26/18; right lower leg wound which was initially trauma in the setting of chronic venous insufficiency. Surface of  the wound looks much better healthy granulation advancing epithelialization. We have good edema control 03/05/18; right lower leg wound which was initially trauma in the setting of chronic venous insufficiency. She continues to make nice progress here. We have good edema control He arrives today with a new traumatic laceration on her right dorsal forearm which she states happened while she was putting on a sweatshirt 03/12/18; right lower leg wound as closed however it still looks vulnerable. She has chronic venous insufficiency. The new wound from last week a traumatic area on her right dorsal or arm unfortunately does not have a viable surface. I remove some nonviable skin and necrotic subcutaneous debris from the wound surface. Hemostasis with silver nitrate and direct pressure 03/19/18; right lower leg wound is closed. She has chronic venous insufficiency and will need ongoing compression The new wounds from 2 weeks ago on the right upper elbow is closed. The area on her right dorsal forearm continues to have a nonviable surface requiring debridement 03/26/18; we'll need to look into ongoing compression for her legs. 2 wounds on the right upper elbow is closed. The area on the right dorsal forearm looks better. Hydrofera Blue secondary to hypertrophic granulation 04/02/18; he has compression stockings although she did not wear them today. Severe chronic venous insufficiency. She has 2 wounds on the right upper elbow which I said were closed last week which  actually are although they're very small and she has a smaller clean wound on the right dorsal forearm. We've been using Hydrofera Blue secondary to some upper granulation. All of this looks better 04/09/18-She is seen in follow up evaluation for a right elbow and right forearm skin tear. The right elbow is healed. We will continue with same treatment plan and she'll follow next week [name], [name] ([id number]) Objective Constitutional Vitals Time Taken: 2:08 PM, Height: 60 in, Weight: 94.2 lbs, BMI: 18.4, Temperature: 97.7 F, Pulse: 70 bpm, Respiratory Rate: 18 breaths/min, Blood Pressure: 141/43 mmHg. Integumentary (Hair, Skin) Wound #2 status is Open. Original cause of wound was Trauma. The wound is located on the Right Forearm. The wound measures 0.5cm length x 0.9cm width x 0.1cm depth; 0.353cm^2 area and 0.035cm^3 volume. There is no tunneling or undermining noted. There is a medium amount of serosanguineous drainage noted. The wound margin is distinct with the outline attached to the wound base. There is large (67-100%) red granulation within the wound bed. There is a small (1-33%) amount of necrotic tissue within the wound bed including Adherent Slough. The periwound skin appearance exhibited: Erythema. The periwound skin appearance did not exhibit: Callus, Crepitus, Excoriation, Induration, Rash, Scarring, Dry/Scaly, Maceration, Atrophie Blanche, Cyanosis, Ecchymosis, Hemosiderin Staining, Mottled, Pallor, Rubor. The surrounding wound skin color is noted with erythema which is circumferential. Periwound temperature was noted as No Abnormality. The periwound has tenderness on palpation. Wound #3 status is Healed - Epithelialized. Original cause of wound was Trauma. The wound is located on the Right Elbow. The wound measures 0cm length x 0cm width x 0cm depth; 0cm^2 area and 0cm^3 volume. Assessment Active Problems ICD-10 Laceration without foreign body of right upper arm,  subsequent encounter Plan Wound Cleansing: Wound #2 Right Forearm: Clean wound with Normal Saline. Primary Wound Dressing: Mepitel One Contact layer Hydrafera Blue Ready Transfer Secondary Dressing: Wound #2 Right Forearm: Boardered Foam Dressing - secured with stretchnet Dressing Change Frequency: Wound #2 Right Forearm: Other: - Keep dressing in place all week. Follow-up Appointments: Wound #2 Right Forearm: Return Appointment in 1 week.  [name], [name] ([id number]) Electronic Signature(s) Signed: 04/09/2018 2:42:52 PM By: Lawanda Cousins Entered By: Lawanda Cousins on 04/09/2018 14:42:52 [name], [name] ([id number]) -------------------------------------------------------------------------------- SuperBill Details Patient Name: [name] Date of Service: 04/09/2018 Medical Record Number: [id number] Patient Account Number: [id number] Date of Birth/Sex: 02-10-1928 (84 y.o. F) Treating RN: [name] Barman Primary Care Provider: Park Liter Other Clinician: Referring Provider: Park Liter Treating Provider/Extender: Cathie Olden in Treatment: 8 Diagnosis Coding ICD-10 Codes Code Description S41.111D Laceration without foreign body of right upper arm, subsequent encounter Facility Procedures CPT4 Code: [id number] Description: [phone] - WOUND CARE VISIT-LEV 2 EST PT Modifier: Quantity: 1 Physician Procedures CPT4 Code: [id number] Description: [id number] - WC PHYS LEVEL 2 - EST PT ICD-10 Diagnosis Description S41.111D Laceration without foreign body of right upper arm, subsequ Modifier: ent encounter Quantity: 1 Electronic Signature(s) Signed: 04/09/2018 2:43:08 PM By: Lawanda Cousins Entered By: Lawanda Cousins on 04/09/2018 14:43:08

## 2018-04-20 NOTE — Progress Notes (Signed)
[name], [name] ([id number]) Visit Report for 04/09/2018 Arrival Information Details Patient Name: [name], [name] Date of Service: 04/09/2018 1:45 PM Medical Record Number: [id number] Patient Account Number: [id number] Date of Birth/Sex: 03-Jul-1928 (84 y.o. F) Treating RN: Montey Hora Primary Care Jonai Weyland: Park Liter Other Clinician: Referring Veldon Wager: Park Liter Treating Charissa Knowles/Extender: Cathie Olden in Treatment: 8 Visit Information History Since Last Visit Added or deleted any medications: No Patient Arrived: Ambulatory Any new allergies or adverse reactions: No Arrival Time: 14:07 Had a fall or experienced change in No Accompanied By: dtr activities of daily living that may affect Transfer Assistance: None risk of falls: Patient Identification Verified: Yes Signs or symptoms of abuse/neglect since last visito No Secondary Verification Process Completed: Yes Hospitalized since last visit: No Patient Requires Transmission-Based No Implantable device outside of the clinic excluding No Precautions: cellular tissue based products placed in the center Patient Has Alerts: No since last visit: Has Dressing in Place as Prescribed: Yes Pain Present Now: No Electronic Signature(s) Signed: 04/09/2018 3:43:39 PM By: Montey Hora Entered By: Montey Hora on 04/09/2018 14:07:50 [name] ([id number]) -------------------------------------------------------------------------------- Clinic Level of Care Assessment Details Patient Name: [name] Date of Service: 04/09/2018 1:45 PM Medical Record Number: [id number] Patient Account Number: [id number] Date of Birth/Sex: 03-29-1928 (84 y.o. F) Treating RN: [name] Barman Primary Care Emmajean Ratledge: Park Liter Other Clinician: Referring Josphine Laffey: Park Liter Treating Vaida Kerchner/Extender: Cathie Olden in Treatment: 8 Clinic Level of Care Assessment Items TOOL 4 Quantity Score []  - Use when only an  EandM is performed on FOLLOW-UP visit 0 ASSESSMENTS - Nursing Assessment / Reassessment []  - Reassessment of Co-morbidities (includes updates in patient status) 0 X- 1 5 Reassessment of Adherence to Treatment Plan ASSESSMENTS - Wound and Skin Assessment / Reassessment X - Simple Wound Assessment / Reassessment - one wound 1 5 []  - 0 Complex Wound Assessment / Reassessment - multiple wounds []  - 0 Dermatologic / Skin Assessment (not related to wound area) ASSESSMENTS - Focused Assessment []  - Circumferential Edema Measurements - multi extremities 0 []  - 0 Nutritional Assessment / Counseling / Intervention []  - 0 Lower Extremity Assessment (monofilament, tuning fork, pulses) []  - 0 Peripheral Arterial Disease Assessment (using hand held doppler) ASSESSMENTS - Ostomy and/or Continence Assessment and Care []  - Incontinence Assessment and Management 0 []  - 0 Ostomy Care Assessment and Management (repouching, etc.) PROCESS - Coordination of Care X - Simple Patient / Family Education for ongoing care 1 15 []  - 0 Complex (extensive) Patient / Family Education for ongoing care []  - 0 Staff obtains Programmer, systems, Records, Test Results / Process Orders []  - 0 Staff telephones HHA, Nursing Homes / Clarify orders / etc []  - 0 Routine Transfer to another Facility (non-emergent condition) []  - 0 Routine Hospital Admission (non-emergent condition) []  - 0 New Admissions / Biomedical engineer / Ordering NPWT, Apligraf, etc. []  - 0 Emergency Hospital Admission (emergent condition) X- 1 10 Simple Discharge Coordination Griesinger, Moniqua ([id number]) []  - 0 Complex (extensive) Discharge Coordination PROCESS - Special Needs []  - Pediatric / Minor Patient Management 0 []  - 0 Isolation Patient Management []  - 0 Hearing / Language / Visual special needs []  - 0 Assessment of Community assistance (transportation, D/C planning, etc.) []  - 0 Additional assistance / Altered mentation []  -  0 Support Surface(s) Assessment (bed, cushion, seat, etc.) INTERVENTIONS - Wound Cleansing / Measurement X - Simple Wound Cleansing - one wound 1 5 []  - 0 Complex Wound Cleansing - multiple wounds X- 1 5 Wound  Imaging (photographs - any number of wounds) []  - 0 Wound Tracing (instead of photographs) X- 1 5 Simple Wound Measurement - one wound []  - 0 Complex Wound Measurement - multiple wounds INTERVENTIONS - Wound Dressings []  - Small Wound Dressing one or multiple wounds 0 X- 1 15 Medium Wound Dressing one or multiple wounds []  - 0 Large Wound Dressing one or multiple wounds []  - 0 Application of Medications - topical []  - 0 Application of Medications - injection INTERVENTIONS - Miscellaneous []  - External ear exam 0 []  - 0 Specimen Collection (cultures, biopsies, blood, body fluids, etc.) []  - 0 Specimen(s) / Culture(s) sent or taken to Lab for analysis []  - 0 Patient Transfer (multiple staff / Civil Service fast streamer / Similar devices) []  - 0 Simple Staple / Suture removal (25 or less) []  - 0 Complex Staple / Suture removal (26 or more) []  - 0 Hypo / Hyperglycemic Management (close monitor of Blood Glucose) []  - 0 Ankle / Brachial Index (ABI) - do not check if billed separately X- 1 5 Vital Signs Favela, Artemis ([id number]) Has the patient been seen at the hospital within the last three years: Yes Total Score: 70 Level Of Care: New/Established - Level 2 Electronic Signature(s) Signed: 04/09/2018 5:11:54 PM By: Gretta Cool, BSN, RN, CWS, Kim RN, BSN Entered By: Gretta Cool, BSN, RN, CWS, Kim on 04/09/2018 14:19:28 [name] ([id number]) -------------------------------------------------------------------------------- Encounter Discharge Information Details Patient Name: [name] Date of Service: 04/09/2018 1:45 PM Medical Record Number: [id number] Patient Account Number: [id number] Date of Birth/Sex: [birth year] (84 y.o. F) Treating RN: [name] Barman Primary Care Mariette Cowley:  Park Liter Other Clinician: Referring Iriana Artley: Park Liter Treating Zimri Brennen/Extender: Cathie Olden in Treatment: 8 Encounter Discharge Information Items Discharge Condition: Stable Ambulatory Status: Ambulatory Discharge Destination: Home Transportation: Private Auto Accompanied By: daughter Schedule Follow-up Appointment: Yes Clinical Summary of Care: Patient Declined Electronic Signature(s) Signed: 04/09/2018 2:25:48 PM By: Gretta Cool, BSN, RN, CWS, Kim RN, BSN Entered By: Gretta Cool, BSN, RN, CWS, Kim on 04/09/2018 14:25:47 [name] ([id number]) -------------------------------------------------------------------------------- Lower Extremity Assessment Details Patient Name: [name] Date of Service: 04/09/2018 1:45 PM Medical Record Number: [id number] Patient Account Number: [id number] Date of Birth/Sex: 1927/09/05 (84 y.o. F) Treating RN: Montey Hora Primary Care Jonanthan Bolender: Park Liter Other Clinician: Referring Jazzie Trampe: Park Liter Treating Jaelynne Hockley/Extender: Lawanda Cousins Weeks in Treatment: 8 Electronic Signature(s) Signed: 04/09/2018 3:43:39 PM By: Montey Hora Entered By: Montey Hora on 04/09/2018 14:14:51 [place], Joycelyn Schmid ([id number]) -------------------------------------------------------------------------------- Multi Wound Chart Details Patient Name: [name] Date of Service: 04/09/2018 1:45 PM Medical Record Number: [id number] Patient Account Number: [id number] Date of Birth/Sex: Sep 14, 1927 (84 y.o. F) Treating RN: [name] Barman Primary Care Sargun Rummell: Park Liter Other Clinician: Referring Shalaina Guardiola: Park Liter Treating Ledarrius Beauchaine/Extender: Cathie Olden in Treatment: 8 Vital Signs Height(in): 60 Pulse(bpm): 70 Weight(lbs): 94.2 Blood Pressure(mmHg): 141/43 Body Mass Index(BMI): 18 Temperature(F): 97.7 Respiratory Rate 18 (breaths/min): Photos: [2:No Photos] [3:No Photos] [N/A:N/A] Wound Location:  [2:Right Forearm] [3:Right Elbow] [N/A:N/A] Wounding Event: [2:Trauma] [3:Trauma] [N/A:N/A] Primary Etiology: [2:Trauma, Other] [3:Trauma, Other] [N/A:N/A] Comorbid History: [2:Cataracts, Glaucoma, Asthma, N/A Chronic Obstructive Pulmonary Disease (COPD), Arrhythmia, Hypertension] [N/A:N/A] Date Acquired: [2:03/03/2018] [3:03/17/2018] [N/A:N/A] Weeks of Treatment: [2:5] [3:3] [N/A:N/A] Wound Status: [2:Open] [3:Healed - Epithelialized] [N/A:N/A] Measurements L x W x D [2:0.5x0.9x0.1] [3:0x0x0] [N/A:N/A] (cm) Area (cm) : [2:0.353] [3:0] [N/A:N/A] Volume (cm) : [2:0.035] [3:0] [N/A:N/A] % Reduction in Area: [2:85.70%] [3:100.00%] [N/A:N/A] % Reduction in Volume: [2:85.80%] [3:100.00%] [N/A:N/A] Classification: [2:Partial Thickness] [3:Full Thickness Without Exposed Support Structures] [N/A:N/A] Exudate Amount: [2:Medium] [  3:N/A] [N/A:N/A] Exudate Type: [2:Serosanguineous] [3:N/A] [N/A:N/A] Exudate Color: [2:red, brown] [3:N/A] [N/A:N/A] Wound Margin: [2:Distinct, outline attached] [3:N/A] [N/A:N/A] Granulation Amount: [2:Large (67-100%)] [3:N/A] [N/A:N/A] Granulation Quality: [2:Red] [3:N/A] [N/A:N/A] Necrotic Amount: [2:Small (1-33%)] [3:N/A] [N/A:N/A] Exposed Structures: [2:Fascia: No Fat Layer (Subcutaneous Tissue) Exposed: No Tendon: No Muscle: No Joint: No Bone: No] [3:N/A] [N/A:N/A] Epithelialization: [2:None] [3:N/A] [N/A:N/A] Periwound Skin Texture: [2:Excoriation: No Induration: No Callus: No] [3:No Abnormalities Noted] [N/A:N/A] Crepitus: No Rash: No Scarring: No Periwound Skin Moisture: Maceration: No No Abnormalities Noted N/A Dry/Scaly: No Periwound Skin Color: Erythema: Yes No Abnormalities Noted N/A Atrophie Blanche: No Cyanosis: No Ecchymosis: No Hemosiderin Staining: No Mottled: No Pallor: No Rubor: No Erythema Location: Circumferential N/A N/A Temperature: No Abnormality N/A N/A Tenderness on Palpation: Yes No N/A Wound Preparation: Ulcer Cleansing: N/A  N/A Rinsed/Irrigated with Saline Topical Anesthetic Applied: Other: lidocaine 4% Treatment Notes Wound #2 (Right Forearm) 1. Cleansed with: Clean wound with Normal Saline 4. Dressing Applied: Hydrafera Blue Notes Mepitel One under Hyroafera Blue covered with BFD secured with stretch netting Electronic Signature(s) Signed: 04/09/2018 2:41:55 PM By: Lawanda Cousins Entered By: Lawanda Cousins on 04/09/2018 14:41:55 [name], [name] ([id number]) -------------------------------------------------------------------------------- Multi-Disciplinary Care Plan Details Patient Name: [name] Date of Service: 04/09/2018 1:45 PM Medical Record Number: [id number] Patient Account Number: [id number] Date of Birth/Sex: 09/17/1927 (84 y.o. F) Treating RN: [name] Barman Primary Care Tayli Buch: Park Liter Other Clinician: Referring [place] Carreno: Park Liter Treating Jessen Siegman/Extender: Cathie Olden in Treatment: 8 Active Inactive ` Necrotic Tissue Nursing Diagnoses: Impaired tissue integrity related to necrotic/devitalized tissue Goals: Necrotic/devitalized tissue will be minimized in the wound bed Date Initiated: 02/12/2018 Target Resolution Date: 03/07/2018 Goal Status: Active Interventions: Provide education on necrotic tissue and debridement process Treatment Activities: Apply topical anesthetic as ordered : 02/12/2018 Notes: ` Orientation to the Wound Care Program Nursing Diagnoses: Knowledge deficit related to the wound healing center program Goals: Patient/caregiver will verbalize understanding of the [place] Date Initiated: 02/12/2018 Target Resolution Date: 03/07/2018 Goal Status: Active Interventions: Provide education on orientation to the wound center Notes: ` Pain, Acute or Chronic Nursing Diagnoses: Potential alteration in comfort, pain Goals: Patient will verbalize adequate pain control and receive pain control interventions during procedures  as needed Date Initiated: 02/12/2018 Target Resolution Date: 03/07/2018 [name], [name] ([id number]) Goal Status: Active Interventions: Reposition patient for comfort Notes: ` Wound/Skin Impairment Nursing Diagnoses: Impaired tissue integrity Goals: Ulcer/skin breakdown will have a volume reduction of 80% by week 12 Date Initiated: 02/12/2018 Target Resolution Date: 05/15/2018 Goal Status: Active Interventions: Assess ulceration(s) every visit Treatment Activities: Patient referred to home care : 02/12/2018 Notes: Electronic Signature(s) Signed: 04/09/2018 5:11:54 PM By: Gretta Cool, BSN, RN, CWS, Kim RN, BSN Entered By: Gretta Cool, BSN, RN, CWS, Kim on 04/09/2018 14:17:03 [name] ([id number]) -------------------------------------------------------------------------------- Pain Assessment Details Patient Name: [name] Date of Service: 04/09/2018 1:45 PM Medical Record Number: [id number] Patient Account Number: [id number] Date of Birth/Sex: February 22, 1928 (84 y.o. F) Treating RN: Montey Hora Primary Care Tayvin Preslar: Park Liter Other Clinician: Referring Eliu Batch: Park Liter Treating Shaquana Buel/Extender: Cathie Olden in Treatment: 8 Active Problems Location of Pain Severity and Description of Pain Patient Has Paino No Site Locations Pain Management and Medication Current Pain Management: Electronic Signature(s) Signed: 04/09/2018 3:43:39 PM By: Montey Hora Entered By: Montey Hora on 04/09/2018 14:07:56 [name] ([id number]) -------------------------------------------------------------------------------- Patient/Caregiver Education Details Patient Name: [name] Date of Service: 04/09/2018 1:45 PM Medical Record Number: [id number] Patient Account Number: [id number] Date of Birth/Gender: 01/25/1928 (84 y.o. F) Treating RN:  [name] Barman Primary Care Physician: Park Liter Other Clinician: Referring Physician: Park Liter Treating  Physician/Extender: Cathie Olden in Treatment: 8 Education Assessment Education Provided To: Patient Education Topics Provided Wound/Skin Impairment: Handouts: Caring for Your Ulcer, Other: keep dressing in place for the week. Methods: Demonstration, Explain/Verbal Responses: State content correctly Electronic Signature(s) Signed: 04/09/2018 5:11:54 PM By: Gretta Cool, BSN, RN, CWS, Kim RN, BSN Entered By: Gretta Cool, BSN, RN, CWS, Kim on 04/09/2018 14:26:21 [name] ([id number]) -------------------------------------------------------------------------------- Wound Assessment Details Patient Name: [name] Date of Service: 04/09/2018 1:45 PM Medical Record Number: [id number] Patient Account Number: [id number] Date of Birth/Sex: 13-Jan-1928 (84 y.o. F) Treating RN: Montey Hora Primary Care [place] Searles: Park Liter Other Clinician: Referring [place] Yepiz: Park Liter Treating Demani Weyrauch/Extender: Cathie Olden in Treatment: 8 Wound Status Wound Number: 2 Primary Trauma, Other Etiology: Wound Location: Right Forearm Wound Open Wounding Event: Trauma Status: Date Acquired: 03/03/2018 Comorbid Cataracts, Glaucoma, Asthma, Chronic Weeks Of Treatment: 5 History: Obstructive Pulmonary Disease (COPD), Clustered Wound: No Arrhythmia, Hypertension Photos Photo Uploaded By: Montey Hora on 04/09/2018 15:00:18 Wound Measurements Length: (cm) 0.5 Width: (cm) 0.9 Depth: (cm) 0.1 Area: (cm) 0.353 Volume: (cm) 0.035 % Reduction in Area: 85.7% % Reduction in Volume: 85.8% Epithelialization: None Tunneling: No Undermining: No Wound Description Classification: Partial Thickness Wound Margin: Distinct, outline attached Exudate Amount: Medium Exudate Type: Serosanguineous Exudate Color: red, brown Foul Odor After Cleansing: No Slough/Fibrino Yes Wound Bed Granulation Amount: Large (67-100%) Exposed Structure Granulation Quality: Red Fascia Exposed:  No Necrotic Amount: Small (1-33%) Fat Layer (Subcutaneous Tissue) Exposed: No Necrotic Quality: Adherent Slough Tendon Exposed: No Muscle Exposed: No Joint Exposed: No Bone Exposed: No Periwound Skin Texture [name], [name] ([id number]) Texture Color No Abnormalities Noted: No No Abnormalities Noted: No Callus: No Atrophie Blanche: No Crepitus: No Cyanosis: No Excoriation: No Ecchymosis: No Induration: No Erythema: Yes Rash: No Erythema Location: Circumferential Scarring: No Hemosiderin Staining: No Mottled: No Moisture Pallor: No No Abnormalities Noted: No Rubor: No Dry / Scaly: No Maceration: No Temperature / Pain Temperature: No Abnormality Tenderness on Palpation: Yes Wound Preparation Ulcer Cleansing: Rinsed/Irrigated with Saline Topical Anesthetic Applied: Other: lidocaine 4%, Treatment Notes Wound #2 (Right Forearm) 1. Cleansed with: Clean wound with Normal Saline 4. Dressing Applied: Hydrafera Blue Notes Mepitel One under Hyroafera Blue covered with BFD secured with stretch netting Electronic Signature(s) Signed: 04/09/2018 3:43:39 PM By: Montey Hora Entered By: Montey Hora on 04/09/2018 14:14:44 [name], [name] ([id number]) -------------------------------------------------------------------------------- Wound Assessment Details Patient Name: [name] Date of Service: 04/09/2018 1:45 PM Medical Record Number: [id number] Patient Account Number: [id number] Date of Birth/Sex: 07/21/1928 (84 y.o. F) Treating RN: Montey Hora Primary Care Carmon Brigandi: Park Liter Other Clinician: Referring Sapphira Harjo: Park Liter Treating Jeilani Grupe/Extender: Cathie Olden in Treatment: 8 Wound Status Wound Number: 3 Primary Etiology: Trauma, Other Wound Location: Right Elbow Wound Status: Healed - Epithelialized Wounding Event: Trauma Date Acquired: 03/17/2018 Weeks Of Treatment: 3 Clustered Wound: No Photos Photo Uploaded By: Montey Hora on 04/09/2018 15:00:45 Wound Measurements Length: (cm) 0 Width: (cm) 0 Depth: (cm) 0 Area: (cm) 0 Volume: (cm) 0 % Reduction in Area: 100% % Reduction in Volume: 100% Wound Description Full Thickness Without Exposed Support Classification: Structures Periwound Skin Texture Texture Color No Abnormalities Noted: No No Abnormalities Noted: No Moisture No Abnormalities Noted: No Electronic Signature(s) Signed: 04/09/2018 3:43:39 PM By: Montey Hora Entered By: Montey Hora on 04/09/2018 14:14:12 [name] ([id number]) -------------------------------------------------------------------------------- [place] Details Patient Name: [name] Date of Service: 04/09/2018 1:45 PM Medical Record Number: [id number]  Patient Account Number: [id number] Date of Birth/Sex: 26-Jun-1928 (84 y.o. F) Treating RN: Montey Hora Primary Care Jonee Lamore: Park Liter Other Clinician: Referring Jonty Morrical: Park Liter Treating Eleuterio Dollar/Extender: Cathie Olden in Treatment: 8 Vital Signs Time Taken: 14:08 Temperature (F): 97.7 Height (in): 60 Pulse (bpm): 70 Weight (lbs): 94.2 Respiratory Rate (breaths/min): 18 Body Mass Index (BMI): 18.4 Blood Pressure (mmHg): 141/43 Reference Range: 80 - 120 mg / dl Electronic Signature(s) Signed: 04/09/2018 3:43:39 PM By: Montey Hora Entered By: Montey Hora on 04/09/2018 14:09:46

## 2018-04-23 ENCOUNTER — Encounter: Payer: Medicare Other | Admitting: Internal Medicine

## 2018-04-23 DIAGNOSIS — S41111D Laceration without foreign body of right upper arm, subsequent encounter: Secondary | ICD-10-CM | POA: Diagnosis not present

## 2018-04-24 ENCOUNTER — Ambulatory Visit (INDEPENDENT_AMBULATORY_CARE_PROVIDER_SITE_OTHER): Payer: Medicare Other

## 2018-04-24 ENCOUNTER — Other Ambulatory Visit: Payer: Self-pay

## 2018-04-24 DIAGNOSIS — R0602 Shortness of breath: Secondary | ICD-10-CM

## 2018-04-24 DIAGNOSIS — I480 Paroxysmal atrial fibrillation: Secondary | ICD-10-CM | POA: Diagnosis not present

## 2018-04-24 NOTE — Progress Notes (Signed)
LUNAH, LOSASSO ([id number]) Visit Report for 04/02/2018 HPI Details Patient Name: [name], [name] Date of Service: 04/02/2018 3:45 PM Medical Record Number: [id number] Patient Account Number: [id number] Date of Birth/Sex: December 19, 1927 (84 y.o. F) Treating RN: [name] Barman Primary Care Provider: Park Liter Other Clinician: Referring Provider: Park Liter Treating Provider/Extender: Tito Dine in Treatment: 7 History of Present Illness HPI Description: 02/12/18 ADMISSION This is an 84 year old woman who is recently moved to [place] from [place]. Her story began in February where she fell on some ice suffering extensive lacerations to her bilateral lower extremities. She is able to show me extensive pictures of the right lower leg but with going to a wound care center and [place] 3 times a week these eventually closed over. She has been left with 1 area on the upper lateral left calf. She has been applying Neosporin to this and applying a bandage. Currently this measures 2 x 1.5 cm. The patient is not a diabetic. She is an ex-smoker quitting 40 years ago. She does have COPD by description. ABIs in our clinic were 1.32 on the right and 1.03 on the left 02/19/18; right lower leg wound which was initially trauma. The area that we look that last week is smaller. Still covered in a nonviable surface however. We are using Iodoflex 02/26/18; right lower leg wound which was initially trauma in the setting of chronic venous insufficiency. Surface of the wound looks much better healthy granulation advancing epithelialization. We have good edema control 03/05/18; right lower leg wound which was initially trauma in the setting of chronic venous insufficiency. She continues to make nice progress here. We have good edema control oHe arrives today with a new traumatic laceration on her right dorsal forearm which she states happened while she was putting on a sweatshirt 03/12/18; right  lower leg wound as closed however it still looks vulnerable. She has chronic venous insufficiency. oThe new wound from last week a traumatic area on her right dorsal or arm unfortunately does not have a viable surface. I remove some nonviable skin and necrotic subcutaneous debris from the wound surface. Hemostasis with silver nitrate and direct pressure 03/19/18; right lower leg wound is closed. She has chronic venous insufficiency and will need ongoing compression oThe new wounds from 2 weeks ago on the right upper elbow is closed. The area on her right dorsal forearm continues to have a nonviable surface requiring debridement 03/26/18; we'll need to look into ongoing compression for her legs. 2 wounds on the right upper elbow is closed. The area on the right dorsal forearm looks better. Hydrofera Blue secondary to hypertrophic granulation 04/02/18; he has compression stockings although she did not wear them today. Severe chronic venous insufficiency. She has 2 wounds on the right upper elbow which I said were closed last week which actually are although they're very small and she has a smaller clean wound on the right dorsal forearm. We've been using Hydrofera Blue secondary to some upper granulation. All of this looks better Electronic Signature(s) Signed: 04/02/2018 6:17:43 PM By: Linton Ham MD Entered By: Linton Ham on 04/02/2018 18:15:02 [name], [name] ([id number]) -------------------------------------------------------------------------------- Physical Exam Details Patient Name: [name] Date of Service: 04/02/2018 3:45 PM Medical Record Number: [id number] Patient Account Number: [id number] Date of Birth/Sex: 1928/05/12 (84 y.o. F) Treating RN: [name] Barman Primary Care Provider: Park Liter Other Clinician: Referring Provider: Park Liter Treating Provider/Extender: Tito Dine in Treatment: 7 Constitutional Sitting or standing Blood Pressure is  within target range  for patient.. Pulse regular and within target range for patient.Marland Kitchen Respirations regular, non-labored and within target range.. Temperature is normal and within the target range for the patient.Marland Kitchen appears in no distress. Notes when exam oLeft lateral calf wound remains closed although she did not come in with her stockings oRight dorsal forearm has a nice healthy granulation. 2 tiny wounds on the right elbow I put his clothes last week although they are not. Services healthy here as Pensions consultant) Signed: 04/02/2018 6:17:43 PM By: Linton Ham MD Entered By: Linton Ham on 04/02/2018 18:15:56 [name] ([id number]) -------------------------------------------------------------------------------- Physician Orders Details Patient Name: [name] Date of Service: 04/02/2018 3:45 PM Medical Record Number: [id number] Patient Account Number: [id number] Date of Birth/Sex: [birth year] (84 y.o. F) Treating RN: [name] Barman Primary Care Provider: Park Liter Other Clinician: Referring Provider: Park Liter Treating Provider/Extender: Tito Dine in Treatment: 7 Verbal / Phone Orders: No Diagnosis Coding ICD-10 Coding Code Description [phone] Non-pressure chronic ulcer of left calf with fat layer exposed I87.311 Chronic venous hypertension (idiopathic) with ulcer of right lower extremity S81.812D Laceration without foreign body, left lower leg, subsequent encounter S41.111D Laceration without foreign body of right upper arm, subsequent encounter Wound Cleansing Wound #2 Right Forearm o Cleanse wound with mild soap and water Wound #3 Right Elbow o Cleanse wound with mild soap and water Anesthetic (add to Medication List) Wound #2 Right Forearm o Topical Lidocaine 4% cream applied to wound bed prior to debridement (In Clinic Only). Wound #3 Right Elbow o Topical Lidocaine 4% cream applied to wound bed prior to  debridement (In Clinic Only). Primary Wound Dressing Wound #2 Right Forearm o Mepitel One Contact layer o Hydrafera Blue Ready Transfer o Mepitel One Contact layer o Hydrafera Blue Ready Transfer Wound #3 Right Elbow o Mepitel One Contact layer o Hydrafera Blue Ready Transfer Secondary Dressing Wound #2 Right Forearm o Kerlix and Coban o Non-adherent pad Wound #3 Right Elbow o Kerlix and Coban o Non-adherent pad Dressing Change Frequency [place], Sharaya ([id number]) Wound #2 Right Forearm o Change dressing every week Wound #3 Right Elbow o Change dressing every week Follow-up Appointments Wound #2 Right Forearm o Return Appointment in 1 week. Wound #3 Right Elbow o Return Appointment in 1 week. Electronic Signature(s) Signed: 04/07/2018 10:45:19 AM By: Gretta Cool, BSN, RN, CWS, Kim RN, BSN Signed: 04/14/2018 8:57:37 AM By: Linton Ham MD Entered By: Gretta Cool, BSN, RN, CWS, Kim on 04/07/2018 10:45:18 [name], [name] ([id number]) -------------------------------------------------------------------------------- Problem List Details Patient Name: [name] Date of Service: 04/02/2018 3:45 PM Medical Record Number: [id number] Patient Account Number: [id number] Date of Birth/Sex: [birth year] (84 y.o. F) Treating RN: [name] Barman Primary Care Provider: Park Liter Other Clinician: Referring Provider: Park Liter Treating Provider/Extender: Tito Dine in Treatment: 7 Active Problems ICD-10 Evaluated Encounter Code Description Active Date Today Diagnosis L97.222 Non-pressure chronic ulcer of left calf with fat layer exposed 02/12/2018 No Yes I87.311 Chronic venous hypertension (idiopathic) with ulcer of right 02/12/2018 No Yes lower extremity S81.812D Laceration without foreign body, left lower leg, subsequent 02/12/2018 No Yes encounter S41.111D Laceration without foreign body of right upper arm, 03/05/2018 No Yes subsequent  encounter Inactive Problems Resolved Problems Electronic Signature(s) Signed: 04/02/2018 6:17:43 PM By: Linton Ham MD Entered By: Linton Ham on 04/02/2018 18:12:07 [name] ([id number]) -------------------------------------------------------------------------------- Progress Note Details Patient Name: [name] Date of Service: 04/02/2018 3:45 PM Medical Record Number: [id number] Patient Account Number: [id number] Date of Birth/Sex: 1927-10-28 (84 y.o. F) Treating RN: [name] Barman  Primary Care Provider: Park Liter Other Clinician: Referring Provider: Park Liter Treating Provider/Extender: Tito Dine in Treatment: 7 Subjective History of Present Illness (HPI) 02/12/18 ADMISSION This is an 84 year old woman who is recently moved to [place] from [place]. Her story began in February where she fell on some ice suffering extensive lacerations to her bilateral lower extremities. She is able to show me extensive pictures of the right lower leg but with going to a wound care center and [place] 3 times a week these eventually closed over. She has been left with 1 area on the upper lateral left calf. She has been applying Neosporin to this and applying a bandage. Currently this measures 2 x 1.5 cm. The patient is not a diabetic. She is an ex-smoker quitting 40 years ago. She does have COPD by description. ABIs in our clinic were 1.32 on the right and 1.03 on the left 02/19/18; right lower leg wound which was initially trauma. The area that we look that last week is smaller. Still covered in a nonviable surface however. We are using Iodoflex 02/26/18; right lower leg wound which was initially trauma in the setting of chronic venous insufficiency. Surface of the wound looks much better healthy granulation advancing epithelialization. We have good edema control 03/05/18; right lower leg wound which was initially trauma in the setting of chronic venous  insufficiency. She continues to make nice progress here. We have good edema control He arrives today with a new traumatic laceration on her right dorsal forearm which she states happened while she was putting on a sweatshirt 03/12/18; right lower leg wound as closed however it still looks vulnerable. She has chronic venous insufficiency. The new wound from last week a traumatic area on her right dorsal or arm unfortunately does not have a viable surface. I remove some nonviable skin and necrotic subcutaneous debris from the wound surface. Hemostasis with silver nitrate and direct pressure 03/19/18; right lower leg wound is closed. She has chronic venous insufficiency and will need ongoing compression The new wounds from 2 weeks ago on the right upper elbow is closed. The area on her right dorsal forearm continues to have a nonviable surface requiring debridement 03/26/18; we'll need to look into ongoing compression for her legs. 2 wounds on the right upper elbow is closed. The area on the right dorsal forearm looks better. Hydrofera Blue secondary to hypertrophic granulation 04/02/18; he has compression stockings although she did not wear them today. Severe chronic venous insufficiency. She has 2 wounds on the right upper elbow which I said were closed last week which actually are although they're very small and she has a smaller clean wound on the right dorsal forearm. We've been using Hydrofera Blue secondary to some upper granulation. All of this looks better Objective Constitutional Sitting or standing Blood Pressure is within target range for patient.. Pulse regular and within target range for patient.Marland Kitchen Respirations regular, non-labored and within target range.. Temperature is normal and within the target range for the patient.Marland Kitchen appears in no distress. [name], [name] ([id number]) Vitals Time Taken: 4:08 PM, Height: 60 in, Weight: 94.2 lbs, BMI: 18.4, Temperature: 97.6 F, Pulse: 74 bpm,  Respiratory Rate: 18 breaths/min, Blood Pressure: 134/58 mmHg. General Notes: when exam Left lateral calf wound remains closed although she did not come in with her stockings Right dorsal forearm has a nice healthy granulation. 2 tiny wounds on the right elbow I put his clothes last week although they are not. Services healthy here as  well Integumentary (Hair, Skin) Wound #2 status is Open. Original cause of wound was Trauma. The wound is located on the Right Forearm. The wound measures 0.5cm length x 1cm width x 0.1cm depth; 0.393cm^2 area and 0.039cm^3 volume. There is no tunneling or undermining noted. There is a medium amount of serosanguineous drainage noted. The wound margin is distinct with the outline attached to the wound base. There is large (67-100%) red granulation within the wound bed. There is a small (1-33%) amount of necrotic tissue within the wound bed including Adherent Slough. The periwound skin appearance exhibited: Erythema. The periwound skin appearance did not exhibit: Callus, Crepitus, Excoriation, Induration, Rash, Scarring, Dry/Scaly, Maceration, Atrophie Blanche, Cyanosis, Ecchymosis, Hemosiderin Staining, Mottled, Pallor, Rubor. The surrounding wound skin color is noted with erythema which is circumferential. Periwound temperature was noted as No Abnormality. The periwound has tenderness on palpation. Wound #3 status is Open. Original cause of wound was Trauma. The wound is located on the Right Elbow. The wound measures 1.9cm length x 0.9cm width x 0.1cm depth; 1.343cm^2 area and 0.134cm^3 volume. There is no tunneling or undermining noted. There is a large amount of serosanguineous drainage noted. The wound margin is distinct with the outline attached to the wound base. There is large (67-100%) red granulation within the wound bed. There is a small (1-33%) amount of necrotic tissue within the wound bed including Adherent Slough. The periwound skin appearance exhibited:  Maceration. Periwound temperature was noted as No Abnormality. The periwound has tenderness on palpation. Assessment Active Problems ICD-10 Non-pressure chronic ulcer of left calf with fat layer exposed Chronic venous hypertension (idiopathic) with ulcer of right lower extremity Laceration without foreign body, left lower leg, subsequent encounter Laceration without foreign body of right upper arm, subsequent encounter Plan #1 continue Hydrofera Blue Kerlix. We are changing this. #2 I encouraged her to wear her stockings. She has no open wounds here but she soon will if she does not comply with compression stockings Electronic Signature(s) Signed: 04/02/2018 6:17:43 PM By: Linton Ham MD [name], [name] ([id number]) Entered By: Linton Ham on 04/02/2018 18:16:38 [name] ([id number]) -------------------------------------------------------------------------------- SuperBill Details Patient Name: [name] Date of Service: 04/02/2018 Medical Record Number: [id number] Patient Account Number: [id number] Date of Birth/Sex: Feb 16, 1928 (84 y.o. F) Treating RN: [name] Barman Primary Care Provider: Park Liter Other Clinician: Referring Provider: Park Liter Treating Provider/Extender: Tito Dine in Treatment: 7 Diagnosis Coding ICD-10 Codes Code Description [phone] Non-pressure chronic ulcer of left calf with fat layer exposed I87.311 Chronic venous hypertension (idiopathic) with ulcer of right lower extremity S81.812D Laceration without foreign body, left lower leg, subsequent encounter S41.111D Laceration without foreign body of right upper arm, subsequent encounter Facility Procedures CPT4 Code: [id number] Description: [phone] - WOUND CARE VISIT-LEV 2 EST PT Modifier: Quantity: 1 Physician Procedures CPT4 Code: [id number] Description: 25852 - WC PHYS LEVEL 2 - EST PT ICD-10 Diagnosis Description S41.111D Laceration without foreign body of right  upper arm, subsequent Modifier: encounter Quantity: 1 Electronic Signature(s) Signed: 04/07/2018 10:46:09 AM By: Gretta Cool, BSN, RN, CWS, Kim RN, BSN Signed: 04/14/2018 8:57:37 AM By: Linton Ham MD Previous Signature: 04/02/2018 6:17:43 PM Version By: Linton Ham MD Entered By: Gretta Cool, BSN, RN, CWS, Kim on 04/07/2018 10:46:08

## 2018-04-24 NOTE — Progress Notes (Signed)
ASJA, FROMMER ([id number]) Visit Report for 03/26/2018 HPI Details Patient Name: [name], [name] Date of Service: 03/26/2018 2:45 PM Medical Record Number: [id number] Patient Account Number: [id number] Date of Birth/Sex: Sep 17, 1927 (84 y.o. F) Treating RN: [name] Barman Primary Care Provider: Park Liter Other Clinician: Referring Provider: Park Liter Treating Provider/Extender: Tito Dine in Treatment: 6 History of Present Illness HPI Description: 02/12/18 ADMISSION This is an 84 year old woman who is recently moved to [place] from [place]. Her story began in February where she fell on some ice suffering extensive lacerations to her bilateral lower extremities. She is able to show me extensive pictures of the right lower leg but with going to a wound care center and [place] 3 times a week these eventually closed over. She has been left with 1 area on the upper lateral left calf. She has been applying Neosporin to this and applying a bandage. Currently this measures 2 x 1.5 cm. The patient is not a diabetic. She is an ex-smoker quitting 40 years ago. She does have COPD by description. ABIs in our clinic were 1.32 on the right and 1.03 on the left 02/19/18; right lower leg wound which was initially trauma. The area that we look that last week is smaller. Still covered in a nonviable surface however. We are using Iodoflex 02/26/18; right lower leg wound which was initially trauma in the setting of chronic venous insufficiency. Surface of the wound looks much better healthy granulation advancing epithelialization. We have good edema control 03/05/18; right lower leg wound which was initially trauma in the setting of chronic venous insufficiency. She continues to make nice progress here. We have good edema control oHe arrives today with a new traumatic laceration on her right dorsal forearm which she states happened while she was putting on a sweatshirt 03/12/18; right  lower leg wound as closed however it still looks vulnerable. She has chronic venous insufficiency. oThe new wound from last week a traumatic area on her right dorsal or arm unfortunately does not have a viable surface. I remove some nonviable skin and necrotic subcutaneous debris from the wound surface. Hemostasis with silver nitrate and direct pressure 03/19/18; right lower leg wound is closed. She has chronic venous insufficiency and will need ongoing compression oThe new wounds from 2 weeks ago on the right upper elbow is closed. The area on her right dorsal forearm continues to have a nonviable surface requiring debridement 03/26/18; we'll need to look into ongoing compression for her legs. 2 wounds on the right upper elbow is closed. The area on the right dorsal forearm looks better. Hydrofera Blue secondary to hypertrophic granulation Electronic Signature(s) Signed: 04/02/2018 8:05:08 AM By: Linton Ham MD Entered By: Linton Ham on 04/01/2018 21:44:17 RANE, DUMM ([id number]) -------------------------------------------------------------------------------- Physical Exam Details Patient Name: [name] Date of Service: 03/26/2018 2:45 PM Medical Record Number: [id number] Patient Account Number: [id number] Date of Birth/Sex: Jun 09, 1928 (84 y.o. F) Treating RN: [name] Barman Primary Care Provider: Park Liter Other Clinician: Referring Provider: Park Liter Treating Provider/Extender: Tito Dine in Treatment: 6 Constitutional Sitting or standing Blood Pressure is within target range for patient.. Pulse regular and within target range for patient.Marland Kitchen Respirations regular, non-labored and within target range.. Temperature is normal and within the target range for the patient.Marland Kitchen appears in no distress. Notes wound exam oLeft lateral calf wound is closed. oRight dorsal forearm is hyper-granulated will change the dressing Hydrofera Blue. oThe area on the  right elbow also was closed Electronic Signature(s) Signed: 04/02/2018 8:05:08  AM By: Linton Ham MD Entered By: Linton Ham on 04/01/2018 21:45:15 [name] ([id number]) -------------------------------------------------------------------------------- Physician Orders Details Patient Name: [name] Date of Service: 03/26/2018 2:45 PM Medical Record Number: [id number] Patient Account Number: [id number] Date of Birth/Sex: August 04, 1928 (84 y.o. F) Treating RN: Montey Hora Primary Care Provider: Park Liter Other Clinician: Referring Provider: Park Liter Treating Provider/Extender: Tito Dine in Treatment: 6 Verbal / Phone Orders: No Diagnosis Coding Wound Cleansing Wound #2 Right Forearm o Cleanse wound with mild soap and water Wound #3 Right Elbow o Cleanse wound with mild soap and water Anesthetic (add to Medication List) Wound #2 Right Forearm o Topical Lidocaine 4% cream applied to wound bed prior to debridement (In Clinic Only). Wound #3 Right Elbow o Topical Lidocaine 4% cream applied to wound bed prior to debridement (In Clinic Only). Primary Wound Dressing Wound #2 Right Forearm o Hydrafera Blue Ready Transfer Wound #3 Right Elbow o Hydrafera Blue Ready Transfer Secondary Dressing Wound #2 Right Forearm o Kerlix and Coban o Non-adherent pad Wound #3 Right Elbow o Kerlix and Coban o Non-adherent pad Dressing Change Frequency Wound #2 Right Forearm o Change dressing every week Wound #3 Right Elbow o Change dressing every week Follow-up Appointments Wound #2 Right Forearm o Return Appointment in 1 week. Wound #3 Right Elbow o Return Appointment in 1 week. VY, BADLEY ([id number]) Electronic Signature(s) Signed: 04/04/2018 6:17:48 PM By: Gretta Cool, BSN, RN, CWS, Kim RN, BSN Signed: 04/14/2018 8:57:37 AM By: Linton Ham MD Previous Signature: 04/01/2018 5:25:40 PM Version By: Montey Hora Previous Signature: 04/02/2018 8:05:08 AM Version By: Linton Ham MD Entered By: Gretta Cool BSN, RN, CWS, Kim on 04/02/2018 16:41:58 COOKIE, PORE ([id number]) -------------------------------------------------------------------------------- Problem List Details Patient Name: [name] Date of Service: 03/26/2018 2:45 PM Medical Record Number: [id number] Patient Account Number: [id number] Date of Birth/Sex: 09-07-1927 (84 y.o. F) Treating RN: [name] Barman Primary Care Provider: Park Liter Other Clinician: Referring Provider: Park Liter Treating Provider/Extender: Tito Dine in Treatment: 6 Active Problems ICD-10 Evaluated Encounter Code Description Active Date Today Diagnosis L97.222 Non-pressure chronic ulcer of left calf with fat layer exposed 02/12/2018 Yes Yes Status Complications Interventions Medical Improving this areas closed remains fully Decision epithelialized Making : I87.311 Chronic venous hypertension (idiopathic) with ulcer of right 02/12/2018 Yes Yes lower extremity Status Complications Interventions Improving Wounds on the left leg have healed oShe will need compression stockings oWe sent her out with [facility] hopefully to maintain Decision edema control to a Making : degree but also protection of her fragile lower extremity skin S81.812D Laceration without foreign body, left lower leg, subsequent 02/12/2018 No Yes encounter S41.111D Laceration without foreign body of right upper arm, 03/05/2018 Yes Yes subsequent encounter Status Complications Interventions Improving both sites look better. primary dressing at [facility] Decision secondary to slight Making : hyper-granulation Inactive Problems Resolved Problems DAJANAE, BROPHY ([id number]) Electronic Signature(s) Signed: 04/02/2018 8:05:08 AM By: Linton Ham MD Entered By: Linton Ham on 04/01/2018 21:42:46 KALEESI, GUYTON  ([id number]) -------------------------------------------------------------------------------- Progress Note Details Patient Name: [name] Date of Service: 03/26/2018 2:45 PM Medical Record Number: [id number] Patient Account Number: [id number] Date of Birth/Sex: 1928-03-15 (84 y.o. F) Treating RN: [name] Barman Primary Care Provider: Park Liter Other Clinician: Referring Provider: Park Liter Treating Provider/Extender: Tito Dine in Treatment: 6 Subjective History of Present Illness (HPI) 02/12/18 ADMISSION This is an 84 year old woman who is recently moved to [place] from [place]. Her story began in February where she fell on some  ice suffering extensive lacerations to her bilateral lower extremities. She is able to show me extensive pictures of the right lower leg but with going to a wound care center and [place] 3 times a week these eventually closed over. She has been left with 1 area on the upper lateral left calf. She has been applying Neosporin to this and applying a bandage. Currently this measures 2 x 1.5 cm. The patient is not a diabetic. She is an ex-smoker quitting 40 years ago. She does have COPD by description. ABIs in our clinic were 1.32 on the right and 1.03 on the left 02/19/18; right lower leg wound which was initially trauma. The area that we look that last week is smaller. Still covered in a nonviable surface however. We are using Iodoflex 02/26/18; right lower leg wound which was initially trauma in the setting of chronic venous insufficiency. Surface of the wound looks much better healthy granulation advancing epithelialization. We have good edema control 03/05/18; right lower leg wound which was initially trauma in the setting of chronic venous insufficiency. She continues to make nice progress here. We have good edema control He arrives today with a new traumatic laceration on her right dorsal forearm which she states happened while she  was putting on a sweatshirt 03/12/18; right lower leg wound as closed however it still looks vulnerable. She has chronic venous insufficiency. The new wound from last week a traumatic area on her right dorsal or arm unfortunately does not have a viable surface. I remove some nonviable skin and necrotic subcutaneous debris from the wound surface. Hemostasis with silver nitrate and direct pressure 03/19/18; right lower leg wound is closed. She has chronic venous insufficiency and will need ongoing compression The new wounds from 2 weeks ago on the right upper elbow is closed. The area on her right dorsal forearm continues to have a nonviable surface requiring debridement 03/26/18; we'll need to look into ongoing compression for her legs. 2 wounds on the right upper elbow is closed. The area on the right dorsal forearm looks better. Hydrofera Blue secondary to hypertrophic granulation Objective Constitutional Sitting or standing Blood Pressure is within target range for patient.. Pulse regular and within target range for patient.Marland Kitchen Respirations regular, non-labored and within target range.. Temperature is normal and within the target range for the patient.Marland Kitchen appears in no distress. Vitals Time Taken: 3:03 PM, Height: 60 in, Weight: 94.2 lbs, BMI: 18.4, Temperature: 97.6 F, Pulse: 72 bpm, Respiratory Rate: 18 breaths/min, Blood Pressure: 143/63 mmHg. Clinard, Siyah ([id number]) General Notes: wound exam Left lateral calf wound is closed. Right dorsal forearm is hyper-granulated will change the dressing Hydrofera Blue. The area on the right elbow also was closed Integumentary (Hair, Skin) Wound #2 status is Open. Original cause of wound was Trauma. The wound is located on the Right Forearm. The wound measures 2cm length x 1.3cm width x 0.1cm depth; 2.042cm^2 area and 0.204cm^3 volume. There is no tunneling or undermining noted. There is a medium amount of serosanguineous drainage noted. The wound  margin is distinct with the outline attached to the wound base. There is large (67-100%) red granulation within the wound bed. There is a small (1-33%) amount of necrotic tissue within the wound bed including Adherent Slough. The periwound skin appearance exhibited: Erythema. The periwound skin appearance did not exhibit: Callus, Crepitus, Excoriation, Induration, Rash, Scarring, Dry/Scaly, Maceration, Atrophie Blanche, Cyanosis, Ecchymosis, Hemosiderin Staining, Mottled, Pallor, Rubor. The surrounding wound skin color is noted with erythema which is circumferential. Periwound  temperature was noted as No Abnormality. The periwound has tenderness on palpation. Wound #3 status is Open. Original cause of wound was Trauma. The wound is located on the Right Elbow. The wound measures 0.6cm length x 1.3cm width x 0.1cm depth; 0.613cm^2 area and 0.061cm^3 volume. There is no tunneling or undermining noted. There is a large amount of serosanguineous drainage noted. The wound margin is distinct with the outline attached to the wound base. There is large (67-100%) red granulation within the wound bed. There is a small (1-33%) amount of necrotic tissue within the wound bed including Adherent Slough. The periwound skin appearance exhibited: Maceration. Periwound temperature was noted as No Abnormality. The periwound has tenderness on palpation. Assessment Active Problems ICD-10 Non-pressure chronic ulcer of left calf with fat layer exposed Chronic venous hypertension (idiopathic) with ulcer of right lower extremity Laceration without foreign body, left lower leg, subsequent encounter Laceration without foreign body of right upper arm, subsequent encounter Plan Wound Cleansing: Wound #2 Right Forearm: Cleanse wound with mild soap and water Wound #3 Right Elbow: Cleanse wound with mild soap and water Anesthetic (add to Medication List): Wound #2 Right Forearm: Topical Lidocaine 4% cream applied to wound  bed prior to debridement (In Clinic Only). Wound #3 Right Elbow: Topical Lidocaine 4% cream applied to wound bed prior to debridement (In Clinic Only). Primary Wound Dressing: Wound #2 Right Forearm: Collagen Wound #3 Right Elbow: Collagen Sawatzky, Addisyn ([id number]) Secondary Dressing: Wound #2 Right Forearm: Kerlix and Coban Non-adherent pad Wound #3 Right Elbow: Kerlix and Coban Non-adherent pad Dressing Change Frequency: Wound #2 Right Forearm: Change dressing every week Wound #3 Right Elbow: Change dressing every week Follow-up Appointments: Wound #2 Right Forearm: Return Appointment in 1 week. Wound #3 Right Elbow: Return Appointment in 1 week. Medical Decision Making Non-pressure chronic ulcer of left calf with fat layer exposed 02/12/2018 Status: Improving Complications: this areas closed Interventions: remains fully epithelialized Chronic venous hypertension (idiopathic) with ulcer of right lower extremity 02/12/2018 Status: Improving Complications: Wounds on the left leg have healed Interventions: She will need compression stockings We sent her out with Tubigrip hopefully to maintain edema control to a degree but also protection of her fragile lower extremity skin Laceration without foreign body of right upper arm, subsequent encounter 03/05/2018 Status: Improving Complications: both sites look better. Interventions: primary dressing at [facility] secondary to slight hyper-granulation #1 Hydrofera Blue to the area on the forearm #2need compression stockings, we'll review this next week Electronic Signature(s) Signed: 04/02/2018 8:05:08 AM By: Linton Ham MD Entered By: Linton Ham on 04/01/2018 21:46:20 [name] ([id number]) -------------------------------------------------------------------------------- SuperBill Details Patient Name: [name] Date of Service: 03/26/2018 Medical Record Number: [id number] Patient Account Number:  [id number] Date of Birth/Sex: 15-Jun-1928 (84 y.o. F) Treating RN: Montey Hora Primary Care Provider: Park Liter Other Clinician: Referring Provider: Park Liter Treating Provider/Extender: Tito Dine in Treatment: 6 Diagnosis Coding ICD-10 Codes Code Description [phone] Non-pressure chronic ulcer of left calf with fat layer exposed I87.311 Chronic venous hypertension (idiopathic) with ulcer of right lower extremity S81.812D Laceration without foreign body, left lower leg, subsequent encounter S41.111D Laceration without foreign body of right upper arm, subsequent encounter Facility Procedures CPT4 Code: [id number] Description: 99213 - WOUND CARE VISIT-LEV 3 EST PT Modifier: Quantity: 1 Physician Procedures CPT4 Code: 8413244 Description: 01027 - WC PHYS LEVEL 2 - EST PT ICD-10 Diagnosis Description S41.111D Laceration without foreign body of right upper arm, subsequent Modifier: encounter Quantity: 1 Electronic Signature(s) Signed: 04/04/2018 8:10:26 AM  By: Gretta Cool, BSN, RN, CWS, Kim RN, BSN Signed: 04/14/2018 8:57:37 AM By: Linton Ham MD Previous Signature: 04/02/2018 8:05:08 AM Version By: Linton Ham MD Previous Signature: 04/01/2018 5:26:22 PM Version By: Montey Hora Entered By: Gretta Cool BSN, RN, CWS, Kim on 04/04/2018 08:10:26

## 2018-04-28 ENCOUNTER — Ambulatory Visit
Admission: RE | Admit: 2018-04-28 | Discharge: 2018-04-28 | Disposition: A | Discharge status: Home / Self Care | Payer: Medicare Other | Source: Ambulatory Visit | Attending: Internal Medicine | Admitting: Internal Medicine

## 2018-04-28 ENCOUNTER — Encounter: Payer: Self-pay | Admitting: Internal Medicine

## 2018-04-28 ENCOUNTER — Ambulatory Visit (INDEPENDENT_AMBULATORY_CARE_PROVIDER_SITE_OTHER): Payer: Medicare Other | Admitting: Internal Medicine

## 2018-04-28 VITALS — BP 128/64 | HR 72 | Resp 16 | Ht 62.0 in | Wt 92.0 lb

## 2018-04-28 DIAGNOSIS — J449 Chronic obstructive pulmonary disease, unspecified: Secondary | ICD-10-CM

## 2018-04-28 DIAGNOSIS — F419 Anxiety disorder, unspecified: Secondary | ICD-10-CM | POA: Insufficient documentation

## 2018-04-28 DIAGNOSIS — K5790 Diverticulosis of intestine, part unspecified, without perforation or abscess without bleeding: Secondary | ICD-10-CM | POA: Insufficient documentation

## 2018-04-28 DIAGNOSIS — M1990 Unspecified osteoarthritis, unspecified site: Secondary | ICD-10-CM | POA: Insufficient documentation

## 2018-04-28 DIAGNOSIS — H269 Unspecified cataract: Secondary | ICD-10-CM | POA: Insufficient documentation

## 2018-04-28 MED ORDER — FLUTICASONE-SALMETEROL 250-50 MCG/DOSE IN AEPB: 1.0000 | INHALATION_SPRAY | Freq: Every day | RESPIRATORY_TRACT | 2 refills | Status: DC

## 2018-04-28 NOTE — Progress Notes (Signed)
[facility] Consultation      Assessment and Plan:     Date: 04/28/2018  MRN# [id number] Keeya Dyckman [birth year]   [name] is a 84 y.o. old female seen in consultation for chief complaint of:    Chief Complaint  Patient presents with  . Consult    referred by Dr. Rockey Situ for eval of emphysema: pt moved here without 02 [place] state.  . Shortness of Breath    with any exertion    HPI:   The patient is a 84 year old female, she was recently seen by cardiology for history of atrial fibrillation, at that time was noted the patient had a history of emphysema was previously on oxygen when she lived in [place]. She has COPD, she has dyspnea on moderate exertion such as making a bed, vacuuming, then she has to stop and rest. She is on no inhalers, she was on 2L oxygen all night, during the day as needed.   She has no cough, no hemoptysis. No weight loss. She has no pets at home. Denies reflux, some sinus drainage.   **Desat waslk 04/28/18; baseline at rest on RA was 95% and HR 71. Walked 180 feet at a slow pace, minimal dyspnea with sat of 94% and HR 72.   **CBC 02/07/2018>> absolute eosinophil count equals 400.    PMHX:   Past Medical History:  Diagnosis Date  . Anxiety   . Atrial fibrillation ([place])   . Clotting disorder ([facility])    left ankle  . COPD (chronic obstructive pulmonary disease) ([place])   . Depression   . Overactive bladder   . Thyroid disease    Surgical Hx:  Past Surgical History:  Procedure Laterality Date  . ABDOMINAL HYSTERECTOMY    . BREAST LUMPECTOMY     Family Hx:  Family History  Problem Relation Age of Onset  . Heart disease Mother   . Heart failure Mother   . Parkinson's disease Father   . Heart disease Sister   . Heart disease Son    Social Hx:   Social History   Tobacco Use  . Smoking status: Former Smoker    Years: 25.00    Types: Cigarettes  . Smokeless tobacco: Never Used  Substance Use Topics  .  Alcohol use: Never    Frequency: Never  . Drug use: Never   Medication:    Current Outpatient Medications:  .  amLODipine (NORVASC) 2.5 MG tablet, Take 1 tablet (2.5 mg total) by mouth daily., Disp: 90 tablet, Rfl: 1 .  aspirin EC 81 MG tablet, Take 1 tablet (81 mg total) by mouth daily., Disp: 100 tablet, Rfl: 4 .  levothyroxine (SYNTHROID, LEVOTHROID) 100 MCG tablet, Take 1 tablet (100 mcg total) by mouth daily before breakfast., Disp: 90 tablet, Rfl: 3 .  metoprolol tartrate (LOPRESSOR) 25 MG tablet, Take 1 tablet (25 mg total) by mouth 2 (two) times daily., Disp: 180 tablet, Rfl: 1 .  metoprolol tartrate (LOPRESSOR) 50 MG tablet, Take 1 tablet (50 mg total) by mouth 2 (two) times daily., Disp: 180 tablet, Rfl: 1 .  solifenacin (VESICARE) 10 MG tablet, Take 1 tablet (10 mg total) by mouth daily., Disp: 90 tablet, Rfl: 1   Allergies:  Patient has no known allergies.  Review of Systems: Gen:  Denies  fever, sweats, chills HEENT: Denies blurred vision, double vision. bleeds, sore throat Cvc:  No dizziness, chest pain. Resp:   Denies cough or sputum production, shortness of breath Gi: Denies  swallowing difficulty, stomach pain. Gu:  Denies bladder incontinence, burning urine Ext:   No Joint pain, stiffness. Skin: No skin rash,  hives  Endoc:  No polyuria, polydipsia. Psych: No depression, insomnia. Other:  All other systems were reviewed with the patient and were negative other that what is mentioned in the HPI.   Physical Examination:   VS: BP 128/64 (BP Location: Left Arm, Cuff Size: Normal)   Pulse 72   Resp 16   Ht 5\' 2"  (1.575 m)   Wt 92 lb (41.7 kg)   SpO2 97%   BMI 16.83 kg/m   General Appearance: No distress  Neuro:without focal findings,  speech normal,  HEENT: PERRLA, EOM intact.   Pulmonary: normal breath sounds, No wheezing.  CardiovascularNormal S1,S2.  No m/r/g.   Abdomen: Benign, Soft, non-tender. Renal:  No costovertebral tenderness  GU:  No performed at  this time. Endoc: No evident thyromegaly, no signs of acromegaly. Skin:   warm, no rashes, no ecchymosis  Extremities: normal, no cyanosis, clubbing.  Other findings:    LABORATORY PANEL:   CBC No results for input(s): WBC, HGB, HCT, PLT in the last 168 hours. ------------------------------------------------------------------------------------------------------------------  Chemistries  No results for input(s): NA, K, CL, CO2, GLUCOSE, BUN, CREATININE, CALCIUM, MG, AST, ALT, ALKPHOS, BILITOT in the last 168 hours.  Invalid input(s): GFRCGP ------------------------------------------------------------------------------------------------------------------  Cardiac Enzymes No results for input(s): TROPONINI in the last 168 hours. ------------------------------------------------------------  RADIOLOGY:  No results found.     Thank  you for the consultation and for allowing [facility] to assist in the care of your patient. Our recommendations are noted above.  Please contact us if we can be of further service.   Marda Stalker, M.D., F.C.C.P.  Board Certified in Internal Medicine, Pulmonary Medicine, [place], and Sleep Medicine.   Pulmonary and Critical Care Office Number: [phone]   04/28/2018

## 2018-04-28 NOTE — Patient Instructions (Addendum)
Will do overnight oxygen test on room air and 6 minute walk test.  Will check Chest xray.  Will start advair inhaler once daily, rinse mouth after use.

## 2018-04-30 ENCOUNTER — Ambulatory Visit (INDEPENDENT_AMBULATORY_CARE_PROVIDER_SITE_OTHER): Payer: Medicare Other | Admitting: *Deleted

## 2018-04-30 ENCOUNTER — Encounter: Payer: Medicare Other | Admitting: Internal Medicine

## 2018-04-30 DIAGNOSIS — S41111D Laceration without foreign body of right upper arm, subsequent encounter: Secondary | ICD-10-CM | POA: Diagnosis not present

## 2018-04-30 DIAGNOSIS — J432 Centrilobular emphysema: Secondary | ICD-10-CM

## 2018-04-30 DIAGNOSIS — J449 Chronic obstructive pulmonary disease, unspecified: Secondary | ICD-10-CM

## 2018-04-30 NOTE — Progress Notes (Signed)
SIX MIN WALK 04/30/2018  Medications Norvasc, Aspirin, Advair, Levothyroxine, Metoprolol  Supplimental Oxygen during Test? (L/min) No  Laps 6  Partial Lap (in Meters) 0  Baseline BP (sitting) 128/68  Baseline Heartrate 99  Baseline Dyspnea (Borg Scale) 1  Baseline Fatigue (Borg Scale) 1  Baseline SPO2 95  BP (sitting) 144/70  Heartrate 100  Dyspnea (Borg Scale) 5  Fatigue (Borg Scale) 5  SPO2 87  BP (sitting) 124/60  Heartrate 96  SPO2 96  Stopped or Paused before Six Minutes No  Distance Completed 288  Tech Comments: pt walked at moderate pace.

## 2018-04-30 NOTE — Progress Notes (Signed)
[name], [name] ([id number]) Visit Report for 04/16/2018 Chief Complaint Document Details Patient Name: [name], [name] Date of Service: 04/16/2018 2:15 PM Medical Record Number: [id number] Patient Account Number: [id number] Date of Birth/Sex: 31-Jul-1928 (84 y.o. F) Treating RN: [name] Barman Primary Care Provider: Park Liter Other Clinician: Referring Provider: Park Liter Treating Provider/Extender: Cathie Olden in Treatment: 9 Information Obtained from: Patient Chief Complaint RFA wound Electronic Signature(s) Signed: 04/16/2018 2:47:23 PM By: Lawanda Cousins Entered By: Lawanda Cousins on 04/16/2018 14:47:22 [place], [name] ([id number]) -------------------------------------------------------------------------------- HPI Details Patient Name: [name] Date of Service: 04/16/2018 2:15 PM Medical Record Number: [id number] Patient Account Number: [id number] Date of Birth/Sex: 1927-11-08 (84 y.o. F) Treating RN: [name] Barman Primary Care Provider: Park Liter Other Clinician: Referring Provider: Park Liter Treating Provider/Extender: Cathie Olden in Treatment: 9 History of Present Illness HPI Description: 02/12/18 ADMISSION This is an 84 year old woman who is recently moved to [place] from [place]. Her story began in February where she fell on some ice suffering extensive lacerations to her bilateral lower extremities. She is able to show me extensive pictures of the right lower leg but with going to a wound care center and [place] 3 times a week these eventually closed over. She has been left with 1 area on the upper lateral left calf. She has been applying Neosporin to this and applying a bandage. Currently this measures 2 x 1.5 cm. The patient is not a diabetic. She is an ex-smoker quitting 40 years ago. She does have COPD by description. ABIs in our clinic were 1.32 on the right and 1.03 on the left 02/19/18; right lower leg wound which was  initially trauma. The area that we look that last week is smaller. Still covered in a nonviable surface however. We are using Iodoflex 02/26/18; right lower leg wound which was initially trauma in the setting of chronic venous insufficiency. Surface of the wound looks much better healthy granulation advancing epithelialization. We have good edema control 03/05/18; right lower leg wound which was initially trauma in the setting of chronic venous insufficiency. She continues to make nice progress here. We have good edema control oHe arrives today with a new traumatic laceration on her right dorsal forearm which she states happened while she was putting on a sweatshirt 03/12/18; right lower leg wound as closed however it still looks vulnerable. She has chronic venous insufficiency. oThe new wound from last week a traumatic area on her right dorsal or arm unfortunately does not have a viable surface. I remove some nonviable skin and necrotic subcutaneous debris from the wound surface. Hemostasis with silver nitrate and direct pressure 03/19/18; right lower leg wound is closed. She has chronic venous insufficiency and will need ongoing compression oThe new wounds from 2 weeks ago on the right upper elbow is closed. The area on her right dorsal forearm continues to have a nonviable surface requiring debridement 03/26/18; we'll need to look into ongoing compression for her legs. 2 wounds on the right upper elbow is closed. The area on the right dorsal forearm looks better. Hydrofera Blue secondary to hypertrophic granulation 04/02/18; he has compression stockings although she did not wear them today. Severe chronic venous insufficiency. She has 2 wounds on the right upper elbow which I said were closed last week which actually are although they're very small and she has a smaller clean wound on the right dorsal forearm. We've been using Hydrofera Blue secondary to some upper granulation. All of this looks  better 04/09/18-She is seen in  follow up evaluation for a right elbow and right forearm skin tear. The right elbow is healed. We will continue with same treatment plan and she'll follow next week 04/16/18-She is seen in follow-up evaluation for right forearm skin tear, this is essentially healed. We will cover with foam border and she will follow-up next week Electronic Signature(s) Signed: 04/16/2018 2:47:50 PM By: Lawanda Cousins Entered By: Lawanda Cousins on 04/16/2018 14:47:49 [name], [name] ([id number]) -------------------------------------------------------------------------------- Physician Orders Details Patient Name: [name] Date of Service: 04/16/2018 2:15 PM Medical Record Number: [id number] Patient Account Number: [id number] Date of Birth/Sex: 09-28-1927 (84 y.o. F) Treating RN: [name] Barman Primary Care Provider: Park Liter Other Clinician: Referring Provider: Park Liter Treating Provider/Extender: Cathie Olden in Treatment: 9 Verbal / Phone Orders: No Diagnosis Coding Primary Wound Dressing Wound #2 Right Forearm o Boardered Foam Dressing Dressing Change Frequency Wound #2 Right Forearm o Change dressing every week Follow-up Appointments Wound #2 Right Forearm o Return Appointment in 1 week. Electronic Signature(s) Signed: 04/16/2018 5:10:54 PM By: Lawanda Cousins Signed: 04/16/2018 5:18:50 PM By: Gretta Cool, BSN, RN, CWS, Kim RN, BSN Entered By: Gretta Cool, BSN, RN, CWS, Kim on 04/16/2018 14:41:16 [name], [name] ([id number]) -------------------------------------------------------------------------------- Problem List Details Patient Name: [name] Date of Service: 04/16/2018 2:15 PM Medical Record Number: [id number] Patient Account Number: [id number] Date of Birth/Sex: May 10, 1928 (84 y.o. F) Treating RN: [name] Barman Primary Care Provider: Park Liter Other Clinician: Referring Provider: Park Liter Treating Provider/Extender:  Cathie Olden in Treatment: 9 Active Problems ICD-10 Evaluated Encounter Code Description Active Date Today Diagnosis S41.111D Laceration without foreign body of right upper arm, 03/05/2018 No Yes subsequent encounter Inactive Problems Resolved Problems ICD-10 Code Description Active Date Resolved Date L97.222 Non-pressure chronic ulcer of left calf with fat layer exposed 02/12/2018 02/12/2018 I87.311 Chronic venous hypertension (idiopathic) with ulcer of right lower 02/12/2018 02/12/2018 extremity S81.812D Laceration without foreign body, left lower leg, subsequent 02/12/2018 02/12/2018 encounter Electronic Signature(s) Signed: 04/16/2018 2:46:43 PM By: Lawanda Cousins Entered By: Lawanda Cousins on 04/16/2018 [name], [name] ([id number]) -------------------------------------------------------------------------------- Progress Note Details Patient Name: [name] Date of Service: 04/16/2018 2:15 PM Medical Record Number: [id number] Patient Account Number: [id number] Date of Birth/Sex: October 21, 1927 (84 y.o. F) Treating RN: [name] Barman Primary Care Provider: Park Liter Other Clinician: Referring Provider: Park Liter Treating Provider/Extender: Cathie Olden in Treatment: 9 Subjective Chief Complaint Information obtained from Patient RFA wound History of Present Illness (HPI) 02/12/18 ADMISSION This is an 84 year old woman who is recently moved to [place] from [place]. Her story began in February where she fell on some ice suffering extensive lacerations to her bilateral lower extremities. She is able to show me extensive pictures of the right lower leg but with going to a wound care center and [place] 3 times a week these eventually closed over. She has been left with 1 area on the upper lateral left calf. She has been applying Neosporin to this and applying a bandage. Currently this measures 2 x 1.5 cm. The patient is not a diabetic. She is an  ex-smoker quitting 40 years ago. She does have COPD by description. ABIs in our clinic were 1.32 on the right and 1.03 on the left 02/19/18; right lower leg wound which was initially trauma. The area that we look that last week is smaller. Still covered in a nonviable surface however. We are using Iodoflex 02/26/18; right lower leg wound which was initially trauma in the setting of chronic venous insufficiency. Surface of the wound looks much better  healthy granulation advancing epithelialization. We have good edema control 03/05/18; right lower leg wound which was initially trauma in the setting of chronic venous insufficiency. She continues to make nice progress here. We have good edema control He arrives today with a new traumatic laceration on her right dorsal forearm which she states happened while she was putting on a sweatshirt 03/12/18; right lower leg wound as closed however it still looks vulnerable. She has chronic venous insufficiency. The new wound from last week a traumatic area on her right dorsal or arm unfortunately does not have a viable surface. I remove some nonviable skin and necrotic subcutaneous debris from the wound surface. Hemostasis with silver nitrate and direct pressure 03/19/18; right lower leg wound is closed. She has chronic venous insufficiency and will need ongoing compression The new wounds from 2 weeks ago on the right upper elbow is closed. The area on her right dorsal forearm continues to have a nonviable surface requiring debridement 03/26/18; we'll need to look into ongoing compression for her legs. 2 wounds on the right upper elbow is closed. The area on the right dorsal forearm looks better. Hydrofera Blue secondary to hypertrophic granulation 04/02/18; he has compression stockings although she did not wear them today. Severe chronic venous insufficiency. She has 2 wounds on the right upper elbow which I said were closed last week which actually are although  they're very small and she has a smaller clean wound on the right dorsal forearm. We've been using Hydrofera Blue secondary to some upper granulation. All of this looks better 04/09/18-She is seen in follow up evaluation for a right elbow and right forearm skin tear. The right elbow is healed. We will continue with same treatment plan and she'll follow next week 04/16/18-She is seen in follow-up evaluation for right forearm skin tear, this is essentially healed. We will cover with foam border and she will follow-up next week [name], [name] ([id number]) Objective Constitutional Vitals Time Taken: 2:18 PM, Height: 60 in, Weight: 94.2 lbs, BMI: 18.4, Temperature: 97.7 F, Pulse: 79 bpm, Respiratory Rate: 18 breaths/min, Blood Pressure: 128/55 mmHg. Integumentary (Hair, Skin) Wound #2 status is Open. Original cause of wound was Trauma. The wound is located on the Right Forearm. The wound measures 0.3cm length x 0.2cm width x 0.1cm depth; 0.047cm^2 area and 0.005cm^3 volume. There is no tunneling or undermining noted. There is a medium amount of serosanguineous drainage noted. The wound margin is distinct with the outline attached to the wound base. There is large (67-100%) red granulation within the wound bed. There is no necrotic tissue within the wound bed. The periwound skin appearance exhibited: Erythema. The periwound skin appearance did not exhibit: Callus, Crepitus, Excoriation, Induration, Rash, Scarring, Dry/Scaly, Maceration, Atrophie Blanche, Cyanosis, Ecchymosis, Hemosiderin Staining, Mottled, Pallor, Rubor. The surrounding wound skin color is noted with erythema which is circumferential. Periwound temperature was noted as No Abnormality. The periwound has tenderness on palpation. Assessment Active Problems ICD-10 Laceration without foreign body of right upper arm, subsequent encounter Plan Primary Wound Dressing: Wound #2 Right Forearm: Boardered Foam Dressing Dressing Change  Frequency: Wound #2 Right Forearm: Change dressing every week Follow-up Appointments: Wound #2 Right Forearm: Return Appointment in 1 week. Electronic Signature(s) Signed: 04/16/2018 2:48:00 PM By: Lawanda Cousins Entered By: Lawanda Cousins on 04/16/2018 14:48:00 [name] ([id number]) -------------------------------------------------------------------------------- SuperBill Details Patient Name: [name] Date of Service: 04/16/2018 Medical Record Number: [id number] Patient Account Number: [id number] Date of Birth/Sex: Nov 20, 1927 (84 y.o. F) Treating RN: [name] Barman Primary Care  Provider: Park Liter Other Clinician: Referring Provider: Park Liter Treating Provider/Extender: Cathie Olden in Treatment: 9 Diagnosis Coding ICD-10 Codes Code Description S41.111D Laceration without foreign body of right upper arm, subsequent encounter Facility Procedures CPT4 Code: [id number] Description: [phone] - WOUND CARE VISIT-LEV 2 EST PT Modifier: Quantity: 1 Physician Procedures CPT4 Code: [id number] Description: 08138 - WC PHYS LEVEL 3 - EST PT ICD-10 Diagnosis Description S41.111D Laceration without foreign body of right upper arm, subsequ Modifier: ent encounter Quantity: 1 Electronic Signature(s) Signed: 04/16/2018 2:48:12 PM By: Lawanda Cousins Entered By: Lawanda Cousins on 04/16/2018 14:48:12

## 2018-05-01 ENCOUNTER — Telehealth: Payer: Self-pay | Admitting: *Deleted

## 2018-05-01 NOTE — Telephone Encounter (Signed)
Pts insurance will not cover Advair Diskus. Called patient's daughter and advised to cal her [employer] and find out what inhaler will be covered in same class as Advair. She is to call our office back so that Dr. Ashby Dawes can change inhaler. Nothing further needed.

## 2018-05-01 NOTE — Progress Notes (Signed)
Carol, Wise ([id number]) Visit Report for 04/16/2018 Arrival Information Details Patient Name: [name], [name] Date of Service: 04/16/2018 2:15 PM Medical Record Number: [id number] Patient Account Number: [id number] Date of Birth/Sex: [birth year] (84 y.o. F) Treating RN: Ahmed Prima Primary Care Isidro Monks: Park Liter Other Clinician: Referring November Sypher: Park Liter Treating Elisheva Fallas/Extender: Cathie Olden in Treatment: 9 Visit Information History Since Last Visit All ordered tests and consults were completed: No Patient Arrived: Ambulatory Added or deleted any medications: No Arrival Time: 14:18 Any new allergies or adverse reactions: No Accompanied By: self Had a fall or experienced change in No Transfer Assistance: None activities of daily living that may affect Patient Identification Verified: Yes risk of falls: Secondary Verification Process Completed: Yes Signs or symptoms of abuse/neglect since last visito No Patient Requires Transmission-Based No Hospitalized since last visit: No Precautions: Implantable device outside of the clinic excluding No Patient Has Alerts: No cellular tissue based products placed in the center since last visit: Has Dressing in Place as Prescribed: Yes Pain Present Now: No Electronic Signature(s) Signed: 04/21/2018 5:34:19 PM By: Alric Quan Entered By: Alric Quan on 04/16/2018 14:18:22 [name] ([id number]) -------------------------------------------------------------------------------- Clinic Level of Care Assessment Details Patient Name: [name] Date of Service: 04/16/2018 2:15 PM Medical Record Number: [id number] Patient Account Number: [id number] Date of Birth/Sex: Nov 14, 1927 (84 y.o. F) Treating RN: [name] Barman Primary Care Emilian Stawicki: Park Liter Other Clinician: Referring Jamille Fisher: Park Liter Treating Terrion Gencarelli/Extender: Cathie Olden in Treatment: 9 Clinic Level of Care  Assessment Items TOOL 4 Quantity Score []  - Use when only an EandM is performed on FOLLOW-UP visit 0 ASSESSMENTS - Nursing Assessment / Reassessment []  - Reassessment of Co-morbidities (includes updates in patient status) 0 X- 1 5 Reassessment of Adherence to Treatment Plan ASSESSMENTS - Wound and Skin Assessment / Reassessment X - Simple Wound Assessment / Reassessment - one wound 1 5 []  - 0 Complex Wound Assessment / Reassessment - multiple wounds []  - 0 Dermatologic / Skin Assessment (not related to wound area) ASSESSMENTS - Focused Assessment []  - Circumferential Edema Measurements - multi extremities 0 []  - 0 Nutritional Assessment / Counseling / Intervention []  - 0 Lower Extremity Assessment (monofilament, tuning fork, pulses) []  - 0 Peripheral Arterial Disease Assessment (using hand held doppler) ASSESSMENTS - Ostomy and/or Continence Assessment and Care []  - Incontinence Assessment and Management 0 []  - 0 Ostomy Care Assessment and Management (repouching, etc.) PROCESS - Coordination of Care []  - Simple Patient / Family Education for ongoing care 0 []  - 0 Complex (extensive) Patient / Family Education for ongoing care []  - 0 Staff obtains Programmer, systems, Records, Test Results / Process Orders []  - 0 Staff telephones HHA, Nursing Homes / Clarify orders / etc []  - 0 Routine Transfer to another Facility (non-emergent condition) []  - 0 Routine Hospital Admission (non-emergent condition) []  - 0 New Admissions / Biomedical engineer / Ordering NPWT, Apligraf, etc. []  - 0 Emergency Hospital Admission (emergent condition) X- 1 10 Simple Discharge Coordination Silber, Chantil ([id number]) []  - 0 Complex (extensive) Discharge Coordination PROCESS - Special Needs []  - Pediatric / Minor Patient Management 0 []  - 0 Isolation Patient Management []  - 0 Hearing / Language / Visual special needs []  - 0 Assessment of Community assistance (transportation, D/C planning,  etc.) []  - 0 Additional assistance / Altered mentation []  - 0 Support Surface(s) Assessment (bed, cushion, seat, etc.) INTERVENTIONS - Wound Cleansing / Measurement X - Simple Wound Cleansing - one wound 1 5 []  - 0 Complex Wound Cleansing -  multiple wounds X- 1 5 Wound Imaging (photographs - any number of wounds) []  - 0 Wound Tracing (instead of photographs) X- 1 5 Simple Wound Measurement - one wound []  - 0 Complex Wound Measurement - multiple wounds INTERVENTIONS - Wound Dressings X - Small Wound Dressing one or multiple wounds 1 10 []  - 0 Medium Wound Dressing one or multiple wounds []  - 0 Large Wound Dressing one or multiple wounds []  - 0 Application of Medications - topical []  - 0 Application of Medications - injection INTERVENTIONS - Miscellaneous []  - External ear exam 0 []  - 0 Specimen Collection (cultures, biopsies, blood, body fluids, etc.) []  - 0 Specimen(s) / Culture(s) sent or taken to Lab for analysis []  - 0 Patient Transfer (multiple staff / Civil Service fast streamer / Similar devices) []  - 0 Simple Staple / Suture removal (25 or less) []  - 0 Complex Staple / Suture removal (26 or more) []  - 0 Hypo / Hyperglycemic Management (close monitor of Blood Glucose) []  - 0 Ankle / Brachial Index (ABI) - do not check if billed separately X- 1 5 Vital Signs Lozoya, Ciji ([id number]) Has the patient been seen at the hospital within the last three years: Yes Total Score: 50 Level Of Care: New/Established - Level 2 Electronic Signature(s) Signed: 04/16/2018 5:18:50 PM By: Gretta Cool, BSN, RN, CWS, Kim RN, BSN Entered By: Gretta Cool, BSN, RN, CWS, Kim on 04/16/2018 14:43:05 [name] ([id number]) -------------------------------------------------------------------------------- Lower Extremity Assessment Details Patient Name: [name] Date of Service: 04/16/2018 2:15 PM Medical Record Number: [id number] Patient Account Number: [id number] Date of Birth/Sex: [birth year]  (84 y.o. F) Treating RN: Ahmed Prima Primary Care Korynn Kenedy: Park Liter Other Clinician: Referring Khamani Daniely: Park Liter Treating Dnyla Antonetti/Extender: Lawanda Cousins Weeks in Treatment: 9 Electronic Signature(s) Signed: 04/21/2018 5:34:19 PM By: Alric Quan Entered By: Alric Quan on 04/16/2018 14:20:22 [name] ([id number]) -------------------------------------------------------------------------------- Multi Wound Chart Details Patient Name: [name] Date of Service: 04/16/2018 2:15 PM Medical Record Number: [id number] Patient Account Number: [id number] Date of Birth/Sex: [birth year] (84 y.o. F) Treating RN: [name] Barman Primary Care Maddon Horton: Park Liter Other Clinician: Referring Meghan Warshawsky: Park Liter Treating Justyce Yeater/Extender: Cathie Olden in Treatment: 9 Vital Signs Height(in): 60 Pulse(bpm): 24 Weight(lbs): 94.2 Blood Pressure(mmHg): 128/55 Body Mass Index(BMI): 18 Temperature(F): 97.7 Respiratory Rate 18 (breaths/min): Photos: [2:No Photos] [N/A:N/A] Wound Location: [2:Right Forearm] [N/A:N/A] Wounding Event: [2:Trauma] [N/A:N/A] Primary Etiology: [2:Trauma, Other] [N/A:N/A] Comorbid History: [2:Cataracts, Glaucoma, Asthma, N/A Chronic Obstructive Pulmonary Disease (COPD), Arrhythmia, Hypertension] Date Acquired: [2:03/03/2018] [N/A:N/A] Weeks of Treatment: [2:6] [N/A:N/A] Wound Status: [2:Open] [N/A:N/A] Measurements L x W x D [2:0.3x0.2x0.1] [N/A:N/A] (cm) Area (cm) : [2:0.047] [N/A:N/A] Volume (cm) : [2:0.005] [N/A:N/A] % Reduction in Area: [2:98.10%] [N/A:N/A] % Reduction in Volume: [2:98.00%] [N/A:N/A] Classification: [2:Partial Thickness] [N/A:N/A] Exudate Amount: [2:Medium] [N/A:N/A] Exudate Type: [2:Serosanguineous] [N/A:N/A] Exudate Color: [2:red, brown] [N/A:N/A] Wound Margin: [2:Distinct, outline attached] [N/A:N/A] Granulation Amount: [2:Large (67-100%)] [N/A:N/A] Granulation Quality: [2:Red]  [N/A:N/A] Necrotic Amount: [2:None Present (0%)] [N/A:N/A] Exposed Structures: [2:Fascia: No Fat Layer (Subcutaneous Tissue) Exposed: No Tendon: No Muscle: No Joint: No Bone: No] [N/A:N/A] Epithelialization: [2:None] [N/A:N/A] Periwound Skin Texture: [2:Excoriation: No Induration: No Callus: No Crepitus: No] [N/A:N/A] Rash: No Scarring: No Periwound Skin Moisture: Maceration: No N/A N/A Dry/Scaly: No Periwound Skin Color: Erythema: Yes N/A N/A Atrophie Blanche: No Cyanosis: No Ecchymosis: No Hemosiderin Staining: No Mottled: No Pallor: No Rubor: No Erythema Location: Circumferential N/A N/A Temperature: No Abnormality N/A N/A Tenderness on Palpation: Yes N/A N/A Wound Preparation: Ulcer Cleansing: N/A N/A Rinsed/Irrigated with Saline  Topical Anesthetic Applied: Other: lidocaine 4% Treatment Notes Electronic Signature(s) Signed: 04/16/2018 5:18:50 PM By: Gretta Cool, BSN, RN, CWS, Kim RN, BSN Entered By: Gretta Cool, BSN, RN, CWS, Kim on 04/16/2018 14:40:40 IESHA, SUMMERHILL ([id number]) -------------------------------------------------------------------------------- Multi-Disciplinary Care Plan Details Patient Name: [name] Date of Service: 04/16/2018 2:15 PM Medical Record Number: [id number] Patient Account Number: [id number] Date of Birth/Sex: March 10, 1928 (84 y.o. F) Treating RN: [name] Barman Primary Care Bradely Rudin: Park Liter Other Clinician: Referring Jaidyn Usery: Park Liter Treating Nuriyah Hanline/Extender: Cathie Olden in Treatment: 9 Active Inactive ` Necrotic Tissue Nursing Diagnoses: Impaired tissue integrity related to necrotic/devitalized tissue Goals: Necrotic/devitalized tissue will be minimized in the wound bed Date Initiated: 02/12/2018 Target Resolution Date: 03/07/2018 Goal Status: Active Interventions: Provide education on necrotic tissue and debridement process Treatment Activities: Apply topical anesthetic as ordered :  02/12/2018 Notes: ` Orientation to the Wound Care Program Nursing Diagnoses: Knowledge deficit related to the wound healing center program Goals: Patient/caregiver will verbalize understanding of the [place] Date Initiated: 02/12/2018 Target Resolution Date: 03/07/2018 Goal Status: Active Interventions: Provide education on orientation to the wound center Notes: ` Pain, Acute or Chronic Nursing Diagnoses: Potential alteration in comfort, pain Goals: Patient will verbalize adequate pain control and receive pain control interventions during procedures as needed Date Initiated: 02/12/2018 Target Resolution Date: 03/07/2018 MOANA, MUNFORD ([id number]) Goal Status: Active Interventions: Reposition patient for comfort Notes: ` Wound/Skin Impairment Nursing Diagnoses: Impaired tissue integrity Goals: Ulcer/skin breakdown will have a volume reduction of 80% by week 12 Date Initiated: 02/12/2018 Target Resolution Date: 05/15/2018 Goal Status: Active Interventions: Assess ulceration(s) every visit Treatment Activities: Patient referred to home care : 02/12/2018 Notes: Electronic Signature(s) Signed: 04/16/2018 5:18:50 PM By: Gretta Cool, BSN, RN, CWS, Kim RN, BSN Entered By: Gretta Cool, BSN, RN, CWS, Kim on 04/16/2018 14:40:35 [name] ([id number]) -------------------------------------------------------------------------------- Pain Assessment Details Patient Name: [name] Date of Service: 04/16/2018 2:15 PM Medical Record Number: [id number] Patient Account Number: [id number] Date of Birth/Sex: [birth year] (84 y.o. F) Treating RN: Ahmed Prima Primary Care Rejeana Fadness: Park Liter Other Clinician: Referring Stevi Hollinshead: Park Liter Treating Iseah Plouff/Extender: Cathie Olden in Treatment: 9 Active Problems Location of Pain Severity and Description of Pain Patient Has Paino No Site Locations Pain Management and Medication Current Pain  Management: Electronic Signature(s) Signed: 04/21/2018 5:34:19 PM By: Alric Quan Entered By: Alric Quan on 04/16/2018 14:18:28 [name] ([id number]) -------------------------------------------------------------------------------- Wound Assessment Details Patient Name: [name] Date of Service: 04/16/2018 2:15 PM Medical Record Number: [id number] Patient Account Number: [id number] Date of Birth/Sex: 31-Aug-1928 (84 y.o. F) Treating RN: Ahmed Prima Primary Care Annagrace Carr: Park Liter Other Clinician: Referring Odella Appelhans: Park Liter Treating Laymon Stockert/Extender: Cathie Olden in Treatment: 9 Wound Status Wound Number: 2 Primary Trauma, Other Etiology: Wound Location: Right Forearm Wound Open Wounding Event: Trauma Status: Date Acquired: 03/03/2018 Comorbid Cataracts, Glaucoma, Asthma, Chronic Weeks Of Treatment: 6 History: Obstructive Pulmonary Disease (COPD), Clustered Wound: No Arrhythmia, Hypertension Photos Photo Uploaded By: Alric Quan on 04/16/2018 16:14:44 Wound Measurements Length: (cm) 0.3 Width: (cm) 0.2 Depth: (cm) 0.1 Area: (cm) 0.047 Volume: (cm) 0.005 % Reduction in Area: 98.1% % Reduction in Volume: 98% Epithelialization: None Tunneling: No Undermining: No Wound Description Classification: Partial Thickness Wound Margin: Distinct, outline attached Exudate Amount: Medium Exudate Type: Serosanguineous Exudate Color: red, brown Foul Odor After Cleansing: No Slough/Fibrino Yes Wound Bed Granulation Amount: Large (67-100%) Exposed Structure Granulation Quality: Red Fascia Exposed: No Necrotic Amount: None Present (0%) Fat Layer (Subcutaneous Tissue) Exposed: No Tendon Exposed: No Muscle Exposed: No Joint  Exposed: No Bone Exposed: No Periwound Skin Texture Taitano, Evee ([id number]) Texture Color No Abnormalities Noted: No No Abnormalities Noted: No Callus: No Atrophie Blanche: No Crepitus:  No Cyanosis: No Excoriation: No Ecchymosis: No Induration: No Erythema: Yes Rash: No Erythema Location: Circumferential Scarring: No Hemosiderin Staining: No Mottled: No Moisture Pallor: No No Abnormalities Noted: No Rubor: No Dry / Scaly: No Maceration: No Temperature / Pain Temperature: No Abnormality Tenderness on Palpation: Yes Wound Preparation Ulcer Cleansing: Rinsed/Irrigated with Saline Topical Anesthetic Applied: Other: lidocaine 4%, Electronic Signature(s) Signed: 04/21/2018 5:34:19 PM By: Alric Quan Entered By: Alric Quan on 04/16/2018 14:23:52 RASHIDA, LADOUCEUR ([id number]) -------------------------------------------------------------------------------- Vitals Details Patient Name: [name] Date of Service: 04/16/2018 2:15 PM Medical Record Number: [id number] Patient Account Number: [id number] Date of Birth/Sex: 1928-03-24 (84 y.o. F) Treating RN: Ahmed Prima Primary Care Adaora Mchaney: Park Liter Other Clinician: Referring Taronda Comacho: Park Liter Treating Lafreda Casebeer/Extender: Cathie Olden in Treatment: 9 Vital Signs Time Taken: 14:18 Temperature (F): 97.7 Height (in): 60 Pulse (bpm): 79 Weight (lbs): 94.2 Respiratory Rate (breaths/min): 18 Body Mass Index (BMI): 18.4 Blood Pressure (mmHg): 128/55 Reference Range: 80 - 120 mg / dl Electronic Signature(s) Signed: 04/21/2018 5:34:19 PM By: Alric Quan Entered By: Alric Quan on 04/16/2018 14:20:12

## 2018-05-02 NOTE — Progress Notes (Signed)
DASIE, CHANCELLOR ([id number]) Visit Report for 04/23/2018 HPI Details Patient Name: [name], [name] Date of Service: 04/23/2018 1:45 PM Medical Record Number: [id number] Patient Account Number: [id number] Date of Birth/Sex: [birth year] (84 y.o. F) Treating RN: [name] Barman Primary Care Provider: Park Liter Other Clinician: Referring Provider: Park Liter Treating Provider/Extender: Tito Dine in Treatment: 10 History of Present Illness HPI Description: 02/12/18 ADMISSION This is an 84 year old woman who is recently moved to [place] from [place]. Her story began in February where she fell on some ice suffering extensive lacerations to her bilateral lower extremities. She is able to show me extensive pictures of the right lower leg but with going to a wound care center and [place] 3 times a week these eventually closed over. She has been left with 1 area on the upper lateral left calf. She has been applying Neosporin to this and applying a bandage. Currently this measures 2 x 1.5 cm. The patient is not a diabetic. She is an ex-smoker quitting 40 years ago. She does have COPD by description. ABIs in our clinic were 1.32 on the right and 1.03 on the left 02/19/18; right lower leg wound which was initially trauma. The area that we look that last week is smaller. Still covered in a nonviable surface however. We are using Iodoflex 02/26/18; right lower leg wound which was initially trauma in the setting of chronic venous insufficiency. Surface of the wound looks much better healthy granulation advancing epithelialization. We have good edema control 03/05/18; right lower leg wound which was initially trauma in the setting of chronic venous insufficiency. She continues to make nice progress here. We have good edema control oHe arrives today with a new traumatic laceration on her right dorsal forearm which she states happened while she was putting on a sweatshirt 03/12/18; right  lower leg wound as closed however it still looks vulnerable. She has chronic venous insufficiency. oThe new wound from last week a traumatic area on her right dorsal or arm unfortunately does not have a viable surface. I remove some nonviable skin and necrotic subcutaneous debris from the wound surface. Hemostasis with silver nitrate and direct pressure 03/19/18; right lower leg wound is closed. She has chronic venous insufficiency and will need ongoing compression oThe new wounds from 2 weeks ago on the right upper elbow is closed. The area on her right dorsal forearm continues to have a nonviable surface requiring debridement 03/26/18; we'll need to look into ongoing compression for her legs. 2 wounds on the right upper elbow is closed. The area on the right dorsal forearm looks better. Hydrofera Blue secondary to hypertrophic granulation 04/02/18; he has compression stockings although she did not wear them today. Severe chronic venous insufficiency. She has 2 wounds on the right upper elbow which I said were closed last week which actually are although they're very small and she has a smaller clean wound on the right dorsal forearm. We've been using Hydrofera Blue secondary to some upper granulation. All of this looks better 04/09/18-She is seen in follow up evaluation for a right elbow and right forearm skin tear. The right elbow is healed. We will continue with same treatment plan and she'll follow next week 04/16/18-She is seen in follow-up evaluation for right forearm skin tear, this is essentially healed. We will cover with foam border and she will follow-up next week 04/23/18; the patient arrives with a new skin tear on her right dorsal arm just below the elbow. She got this while carrying a  box. She has a linear skin tear. There is no depth I don't think this should've been sutured. There is a skin flap medially I'm not sure if this will be viable or not Electronic Signature(s) Signed:  04/23/2018 5:35:06 PM By: Linton Ham MD Entered By: Linton Ham on 04/23/2018 15:19:48 [name], [name] ([id number]) [name], [name] ([id number]) -------------------------------------------------------------------------------- Physical Exam Details Patient Name: [name] Date of Service: 04/23/2018 1:45 PM Medical Record Number: [id number] Patient Account Number: [id number] Date of Birth/Sex: [birth year] (84 y.o. F) Treating RN: [name] Barman Primary Care Provider: Park Liter Other Clinician: Referring Provider: Park Liter Treating Provider/Extender: Tito Dine in Treatment: 10 Constitutional Patient is hypertensive.. Pulse regular and within target range for patient.Marland Kitchen Respirations regular, non-labored and within target range.. Temperature is normal and within the target range for the patient.Marland Kitchen appears in no distress. Notes wound exam; the patient has a new area on the dorsal forearm. This is not the same areas previously this is healed. This is a skin tear linear area. Medial area of skin flap which may or may not remain viable. Underlying tissue is superficial Electronic Signature(s) Signed: 04/23/2018 5:35:06 PM By: Linton Ham MD Entered By: Linton Ham on 04/23/2018 15:20:47 [name], [name] ([id number]) -------------------------------------------------------------------------------- Physician Orders Details Patient Name: [name] Date of Service: 04/23/2018 1:45 PM Medical Record Number: [id number] Patient Account Number: [id number] Date of Birth/Sex: 07/11/1928 (84 y.o. F) Treating RN: Montey Hora Primary Care Provider: Park Liter Other Clinician: Referring Provider: Park Liter Treating Provider/Extender: Tito Dine in Treatment: 10 Verbal / Phone Orders: No Diagnosis Coding Wound Cleansing Wound #4 Right,Proximal Forearm o Cleanse wound with mild soap and water o May Shower, gently pat wound  dry prior to applying new dressing. Anesthetic (add to Medication List) Wound #4 Right,Proximal Forearm o Topical Lidocaine 4% cream applied to wound bed prior to debridement (In Clinic Only). Primary Wound Dressing Wound #4 Right,Proximal Forearm o Silver Alginate Secondary Dressing Wound #4 Right,Proximal Forearm o Conform/Kerlix o Non-adherent pad Dressing Change Frequency Wound #4 Right,Proximal Forearm o Other: - twice weekly Follow-up Appointments Wound #4 Right,Proximal Forearm o Return Appointment in 1 week. Electronic Signature(s) Signed: 04/23/2018 5:18:29 PM By: Montey Hora Signed: 04/23/2018 5:35:06 PM By: Linton Ham MD Entered By: Montey Hora on 04/23/2018 14:37:27 [name], [name] ([id number]) -------------------------------------------------------------------------------- Problem List Details Patient Name: [name] Date of Service: 04/23/2018 1:45 PM Medical Record Number: [id number] Patient Account Number: [id number] Date of Birth/Sex: May 17, 1928 (84 y.o. F) Treating RN: [name] Barman Primary Care Provider: Park Liter Other Clinician: Referring Provider: Park Liter Treating Provider/Extender: Tito Dine in Treatment: 10 Active Problems ICD-10 Evaluated Encounter Code Description Active Date Today Diagnosis S41.111D Laceration without foreign body of right upper arm, 03/05/2018 No Yes subsequent encounter Inactive Problems Resolved Problems ICD-10 Code Description Active Date Resolved Date L97.222 Non-pressure chronic ulcer of left calf with fat layer exposed 02/12/2018 02/12/2018 I87.311 Chronic venous hypertension (idiopathic) with ulcer of right lower 02/12/2018 02/12/2018 extremity S81.812D Laceration without foreign body, left lower leg, subsequent 02/12/2018 02/12/2018 encounter Electronic Signature(s) Signed: 04/23/2018 5:35:06 PM By: Linton Ham MD Entered By: Linton Ham on 04/23/2018  15:16:06 [name] ([id number]) -------------------------------------------------------------------------------- Progress Note Details Patient Name: [name] Date of Service: 04/23/2018 1:45 PM Medical Record Number: [id number] Patient Account Number: [id number] Date of Birth/Sex: 09/08/1927 (84 y.o. F) Treating RN: [name] Barman Primary Care Provider: Park Liter Other Clinician: Referring Provider: Park Liter Treating Provider/Extender: Tito Dine in Treatment: 10 Subjective History of Present Illness (  HPI) 02/12/18 ADMISSION This is an 84 year old woman who is recently moved to [place] from [place]. Her story began in February where she fell on some ice suffering extensive lacerations to her bilateral lower extremities. She is able to show me extensive pictures of the right lower leg but with going to a wound care center and [place] 3 times a week these eventually closed over. She has been left with 1 area on the upper lateral left calf. She has been applying Neosporin to this and applying a bandage. Currently this measures 2 x 1.5 cm. The patient is not a diabetic. She is an ex-smoker quitting 40 years ago. She does have COPD by description. ABIs in our clinic were 1.32 on the right and 1.03 on the left 02/19/18; right lower leg wound which was initially trauma. The area that we look that last week is smaller. Still covered in a nonviable surface however. We are using Iodoflex 02/26/18; right lower leg wound which was initially trauma in the setting of chronic venous insufficiency. Surface of the wound looks much better healthy granulation advancing epithelialization. We have good edema control 03/05/18; right lower leg wound which was initially trauma in the setting of chronic venous insufficiency. She continues to make nice progress here. We have good edema control He arrives today with a new traumatic laceration on her right dorsal forearm which she  states happened while she was putting on a sweatshirt 03/12/18; right lower leg wound as closed however it still looks vulnerable. She has chronic venous insufficiency. The new wound from last week a traumatic area on her right dorsal or arm unfortunately does not have a viable surface. I remove some nonviable skin and necrotic subcutaneous debris from the wound surface. Hemostasis with silver nitrate and direct pressure 03/19/18; right lower leg wound is closed. She has chronic venous insufficiency and will need ongoing compression The new wounds from 2 weeks ago on the right upper elbow is closed. The area on her right dorsal forearm continues to have a nonviable surface requiring debridement 03/26/18; we'll need to look into ongoing compression for her legs. 2 wounds on the right upper elbow is closed. The area on the right dorsal forearm looks better. Hydrofera Blue secondary to hypertrophic granulation 04/02/18; he has compression stockings although she did not wear them today. Severe chronic venous insufficiency. She has 2 wounds on the right upper elbow which I said were closed last week which actually are although they're very small and she has a smaller clean wound on the right dorsal forearm. We've been using Hydrofera Blue secondary to some upper granulation. All of this looks better 04/09/18-She is seen in follow up evaluation for a right elbow and right forearm skin tear. The right elbow is healed. We will continue with same treatment plan and she'll follow next week 04/16/18-She is seen in follow-up evaluation for right forearm skin tear, this is essentially healed. We will cover with foam border and she will follow-up next week 04/23/18; the patient arrives with a new skin tear on her right dorsal arm just below the elbow. She got this while carrying a box. She has a linear skin tear. There is no depth I don't think this should've been sutured. There is a skin flap medially I'm not sure  if this will be viable or not [name], [name] ([id number]) Objective Constitutional Patient is hypertensive.. Pulse regular and within target range for patient.Marland Kitchen Respirations regular, non-labored and within target range.. Temperature is normal and within  the target range for the patient.Marland Kitchen appears in no distress. Vitals Time Taken: 2:04 PM, Height: 60 in, Weight: 94.2 lbs, BMI: 18.4, Temperature: 97.7 F, Pulse: 60 bpm, Respiratory Rate: 16 breaths/min, Blood Pressure: 154/51 mmHg. General Notes: wound exam; the patient has a new area on the dorsal forearm. This is not the same areas previously this is healed. This is a skin tear linear area. Medial area of skin flap which may or may not remain viable. Underlying tissue is superficial Integumentary (Hair, Skin) Wound #2 status is Healed - Epithelialized. Original cause of wound was Trauma. The wound is located on the Right Forearm. The wound measures 0cm length x 0cm width x 0cm depth; 0cm^2 area and 0cm^3 volume. There is no tunneling or undermining noted. There is a none present amount of drainage noted. The wound margin is distinct with the outline attached to the wound base. There is no granulation within the wound bed. There is no necrotic tissue within the wound bed. The periwound skin appearance exhibited: Erythema. The periwound skin appearance did not exhibit: Callus, Crepitus, Excoriation, Induration, Rash, Scarring, Dry/Scaly, Maceration, Atrophie Blanche, Cyanosis, Ecchymosis, Hemosiderin Staining, Mottled, Pallor, Rubor. The surrounding wound skin color is noted with erythema which is circumferential. Periwound temperature was noted as No Abnormality. The periwound has tenderness on palpation. Wound #4 status is Open. Original cause of wound was Trauma. The wound is located on the Right,Proximal Forearm. The wound measures 1.5cm length x 0.4cm width x 0.1cm depth; 0.471cm^2 area and 0.047cm^3 volume. There is no tunneling or  undermining noted. There is a large amount of serosanguineous drainage noted. The wound margin is distinct with the outline attached to the wound base. There is no granulation within the wound bed. There is a large (67-100%) amount of necrotic tissue within the wound bed including Adherent Slough. Periwound temperature was noted as No Abnormality. The periwound has tenderness on palpation. Assessment Active Problems ICD-10 Laceration without foreign body of right upper arm, subsequent encounter Plan Wound Cleansing: Wound #4 Right,Proximal Forearm: Cleanse wound with mild soap and water May Shower, gently pat wound dry prior to applying new dressing. Anesthetic (add to Medication List): Wound #4 Right,Proximal Forearm: Topical Lidocaine 4% cream applied to wound bed prior to debridement (In Clinic Only). Primary Wound Dressing: [name], [name] ([id number]) Wound #4 Right,Proximal Forearm: Silver Alginate Secondary Dressing: Wound #4 Right,Proximal Forearm: Conform/Kerlix Non-adherent pad Dressing Change Frequency: Wound #4 Right,Proximal Forearm: Other: - twice weekly Follow-up Appointments: Wound #4 Right,Proximal Forearm: Return Appointment in 1 week. #1 I elected to put silver alginate Kerlix and conformer over this area #2 we'll see if the flap area of skin remains viable or not if not I will remove it next week Electronic Signature(s) Signed: 04/23/2018 5:35:06 PM By: Linton Ham MD Entered By: Linton Ham on 04/23/2018 15:21:29 [name] ([id number]) -------------------------------------------------------------------------------- SuperBill Details Patient Name: [name] Date of Service: 04/23/2018 Medical Record Number: [id number] Patient Account Number: [id number] Date of Birth/Sex: [birth year] (84 y.o. F) Treating RN: [name] Barman Primary Care Provider: Park Liter Other Clinician: Referring Provider: Park Liter Treating  Provider/Extender: Tito Dine in Treatment: 10 Diagnosis Coding ICD-10 Codes Code Description S41.111D Laceration without foreign body of right upper arm, subsequent encounter Facility Procedures CPT4 Code: [id number] Description: 99213 - WOUND CARE VISIT-LEV 3 EST PT Modifier: Quantity: 1 Physician Procedures CPT4 Code: [id number] Description: 17408 - WC PHYS LEVEL 2 - EST PT ICD-10 Diagnosis Description S41.111D Laceration without foreign body of right upper arm, subsequ Modifier:  ent encounter Quantity: 1 Electronic Signature(s) Signed: 04/23/2018 5:35:06 PM By: Linton Ham MD Entered By: Linton Ham on 04/23/2018 15:21:44

## 2018-05-02 NOTE — Progress Notes (Signed)
LONNA, RABOLD ([id number]) Visit Report for 04/23/2018 Arrival Information Wise Patient Name: Carol, Wise Date of Service: 04/23/2018 1:45 PM Medical Record Number: [id number] Patient Account Number: [id number] Date of Birth/Sex: 1928-08-12 (84 y.o. F) Treating RN: Ahmed Prima Primary Care Mackinley Kiehn: Park Liter Other Clinician: Referring Kervin Bones: Park Liter Treating Adelina Collard/Extender: Tito Dine in Treatment: 10 Visit Information History Since Last Visit All ordered tests and consults were completed: No Patient Arrived: Ambulatory Added or deleted any medications: No Arrival Time: 14:03 Any new allergies or adverse reactions: No Accompanied By: daughter Had a fall or experienced change in No Transfer Assistance: None activities of daily living that may affect Patient Identification Verified: Yes risk of falls: Secondary Verification Process Completed: Yes Signs or symptoms of abuse/neglect since last visito No Patient Requires Transmission-Based No Hospitalized since last visit: No Precautions: Implantable device outside of the clinic excluding No Patient Has Alerts: No cellular tissue based products placed in the center since last visit: Has Dressing in Place as Prescribed: Yes Pain Present Now: No Electronic Signature(s) Signed: 04/23/2018 4:43:18 PM By: Alric Quan Entered By: Alric Quan on 04/23/2018 14:04:11 [name] ([id number]) -------------------------------------------------------------------------------- Clinic Level of Care Assessment Wise Patient Name: [name] Date of Service: 04/23/2018 1:45 PM Medical Record Number: [id number] Patient Account Number: [id number] Date of Birth/Sex: 03-14-1928 (84 y.o. F) Treating RN: Montey Hora Primary Care Vassie Kugel: Park Liter Other Clinician: Referring Damiel Barthold: Park Liter Treating Peregrine Nolt/Extender: Tito Dine in Treatment:  10 Clinic Level of Care Assessment Items TOOL 4 Quantity Score []  - Use when only an EandM is performed on FOLLOW-UP visit 0 ASSESSMENTS - Nursing Assessment / Reassessment X - Reassessment of Co-morbidities (includes updates in patient status) 1 10 X- 1 5 Reassessment of Adherence to Treatment Plan ASSESSMENTS - Carol and Skin Assessment / Reassessment []  - Simple Carol Assessment / Reassessment - one Carol 0 X- 2 5 Complex Carol Assessment / Reassessment - multiple wounds []  - 0 Dermatologic / Skin Assessment (not related to Carol area) ASSESSMENTS - Focused Assessment []  - Circumferential Edema Measurements - multi extremities 0 []  - 0 Nutritional Assessment / Counseling / Intervention []  - 0 Lower Extremity Assessment (monofilament, tuning fork, pulses) []  - 0 Peripheral Arterial Disease Assessment (using hand held doppler) ASSESSMENTS - Ostomy and/or Continence Assessment and Care []  - Incontinence Assessment and Management 0 []  - 0 Ostomy Care Assessment and Management (repouching, etc.) PROCESS - Coordination of Care X - Simple Patient / Family Education for ongoing care 1 15 []  - 0 Complex (extensive) Patient / Family Education for ongoing care []  - 0 Staff obtains Programmer, systems, Records, Test Results / Process Orders []  - 0 Staff telephones HHA, Nursing Homes / Clarify orders / etc []  - 0 Routine Transfer to another Facility (non-emergent condition) []  - 0 Routine Hospital Admission (non-emergent condition) []  - 0 New Admissions / Biomedical engineer / Ordering NPWT, Apligraf, etc. []  - 0 Emergency Hospital Admission (emergent condition) X- 1 10 Simple Discharge Coordination [name], [name] ([id number]) []  - 0 Complex (extensive) Discharge Coordination PROCESS - Special Needs []  - Pediatric / Minor Patient Management 0 []  - 0 Isolation Patient Management []  - 0 Hearing / Language / Visual special needs []  - 0 Assessment of Community assistance  (transportation, D/C planning, etc.) []  - 0 Additional assistance / Altered mentation []  - 0 Support Surface(s) Assessment (bed, cushion, seat, etc.) INTERVENTIONS - Carol Cleansing / Measurement []  - Simple Carol Cleansing - one Carol 0 X- 2 5 Complex  Carol Cleansing - multiple wounds X- 1 5 Carol Imaging (photographs - any number of wounds) []  - 0 Carol Tracing (instead of photographs) []  - 0 Simple Carol Measurement - one Carol X- 2 5 Complex Carol Measurement - multiple wounds INTERVENTIONS - Carol Dressings X - Small Carol Dressing one or multiple wounds 1 10 []  - 0 Medium Carol Dressing one or multiple wounds []  - 0 Large Carol Dressing one or multiple wounds []  - 0 Application of Medications - topical []  - 0 Application of Medications - injection INTERVENTIONS - Miscellaneous []  - External ear exam 0 []  - 0 Specimen Collection (cultures, biopsies, blood, body fluids, etc.) []  - 0 Specimen(s) / Culture(s) sent or taken to Lab for analysis []  - 0 Patient Transfer (multiple staff / Civil Service fast streamer / Similar devices) []  - 0 Simple Staple / Suture removal (25 or less) []  - 0 Complex Staple / Suture removal (26 or more) []  - 0 Hypo / Hyperglycemic Management (close monitor of Blood Glucose) []  - 0 Ankle / Brachial Index (ABI) - do not check if billed separately X- 1 5 Vital Signs [name], [name] ([id number]) Has the patient been seen at the hospital within the last three years: Yes Total Score: 90 Level Of Care: New/Established - Level 3 Electronic Signature(s) Signed: 04/23/2018 5:18:29 PM By: Montey Hora Entered By: Montey Hora on 04/23/2018 15:08:48 [name] ([id number]) -------------------------------------------------------------------------------- Encounter Discharge Information Wise Patient Name: [name] Date of Service: 04/23/2018 1:45 PM Medical Record Number: [id number] Patient Account Number: [id number] Date of Birth/Sex:  05/04/1928 (84 y.o. F) Treating RN: Montey Hora Primary Care Nani Ingram: Park Liter Other Clinician: Referring Thandiwe Siragusa: Park Liter Treating Jorja Empie/Extender: Tito Dine in Treatment: 10 Encounter Discharge Information Items Discharge Condition: Stable Ambulatory Status: Cane Discharge Destination: Home Transportation: Private Auto Accompanied By: dtr Schedule Follow-up Appointment: Yes Clinical Summary of Care: Electronic Signature(s) Signed: 04/23/2018 3:09:43 PM By: Montey Hora Entered By: Montey Hora on 04/23/2018 15:09:42 [name] ([id number]) -------------------------------------------------------------------------------- Lower Extremity Assessment Wise Patient Name: [name] Date of Service: 04/23/2018 1:45 PM Medical Record Number: [id number] Patient Account Number: [id number] Date of Birth/Sex: 07/03/1928 (84 y.o. F) Treating RN: Ahmed Prima Primary Care Synethia Endicott: Park Liter Other Clinician: Referring Colleene Swarthout: Park Liter Treating Carlton Buskey/Extender: Ricard Dillon Weeks in Treatment: 10 Electronic Signature(s) Signed: 04/23/2018 4:43:18 PM By: Alric Quan Entered By: Alric Quan on 04/23/2018 14:14:48 [name], [name] ([id number]) -------------------------------------------------------------------------------- Multi Carol Chart Wise Patient Name: [name] Date of Service: 04/23/2018 1:45 PM Medical Record Number: [id number] Patient Account Number: [id number] Date of Birth/Sex: 1928/02/05 (84 y.o. F) Treating RN: Montey Hora Primary Care Lunette Tapp: Park Liter Other Clinician: Referring Gerri Acre: Park Liter Treating Kebron Pulse/Extender: Tito Dine in Treatment: 10 Vital Signs Height(in): 60 Pulse(bpm): 60 Weight(lbs): 94.2 Blood Pressure(mmHg): 154/51 Body Mass Index(BMI): 18 Temperature(F): 97.7 Respiratory Rate 16 (breaths/min): Photos: [2:No Photos]  [4:No Photos] [N/A:N/A] Carol Location: [2:Right Forearm] [4:Right Forearm - Proximal] [N/A:N/A] Wounding Event: [2:Trauma] [4:Trauma] [N/A:N/A] Primary Etiology: [2:Trauma, Other] [4:Trauma, Other] [N/A:N/A] Comorbid History: [2:Cataracts, Glaucoma, Asthma, Cataracts, Glaucoma, Asthma, N/A Chronic Obstructive Pulmonary Disease (COPD), Pulmonary Disease (COPD), Arrhythmia, Hypertension] [4:Chronic Obstructive Arrhythmia, Hypertension] Date Acquired: [2:03/03/2018] [4:04/21/2018] [N/A:N/A] Weeks of Treatment: [2:7] [4:0] [N/A:N/A] Carol Status: [2:Healed - Epithelialized] [4:Open] [N/A:N/A] Measurements L x W x D [2:0x0x0] [4:1.5x0.4x0.1] [N/A:N/A] (cm) Area (cm) : [2:0] [4:0.471] [N/A:N/A] Volume (cm) : [2:0] [4:0.047] [N/A:N/A] % Reduction in Area: [2:100.00%] [4:N/A] [N/A:N/A] % Reduction in Volume: [2:100.00%] [4:N/A] [N/A:N/A] Classification: [2:Partial Thickness] [4:Full Thickness Without Exposed  Support Structures] [N/A:N/A] Exudate Amount: [2:None Present] [4:Large] [N/A:N/A] Exudate Type: [2:N/A] [4:Serosanguineous] [N/A:N/A] Exudate Color: [2:N/A] [4:red, brown] [N/A:N/A] Carol Margin: [2:Distinct, outline attached] [4:Distinct, outline attached] [N/A:N/A] Granulation Amount: [2:None Present (0%)] [4:None Present (0%)] [N/A:N/A] Necrotic Amount: [2:None Present (0%)] [4:Large (67-100%)] [N/A:N/A] Exposed Structures: [2:Fascia: No Fat Layer (Subcutaneous Tissue) Exposed: No Tendon: No Muscle: No Joint: No Bone: No] [4:Fascia: No Fat Layer (Subcutaneous Tissue) Exposed: No Tendon: No Muscle: No Joint: No Bone: No] [N/A:N/A] Epithelialization: [2:Large (67-100%)] [4:None] [N/A:N/A] Periwound Skin Texture: [2:Excoriation: No Induration: No Callus: No Crepitus: No] [4:No Abnormalities Noted] [N/A:N/A] Rash: No Scarring: No Periwound Skin Moisture: Maceration: No No Abnormalities Noted N/A Dry/Scaly: No Periwound Skin Color: Erythema: Yes No Abnormalities Noted N/A Atrophie  Blanche: No Cyanosis: No Ecchymosis: No Hemosiderin Staining: No Mottled: No Pallor: No Rubor: No Erythema Location: Circumferential N/A N/A Temperature: No Abnormality No Abnormality N/A Tenderness on Palpation: Yes Yes N/A Carol Preparation: Ulcer Cleansing: Ulcer Cleansing: N/A Rinsed/Irrigated with Saline Rinsed/Irrigated with Saline Topical Anesthetic Applied: Topical Anesthetic Applied: None Other: lidocaine 4% Treatment Notes Carol #4 (Right, Proximal Forearm) 1. Cleansed with: Clean Carol with Normal Saline 4. Dressing Applied: Calcium Alginate with Silver 5. Secondary Dressing Applied Kerlix/Conform Non-Adherent pad Notes netting Electronic Signature(s) Signed: 04/23/2018 5:35:06 PM By: Linton Ham MD Entered By: Linton Ham on 04/23/2018 15:18:26 [name], [name] ([id number]) -------------------------------------------------------------------------------- Multi-Disciplinary Care Plan Wise Patient Name: [name] Date of Service: 04/23/2018 1:45 PM Medical Record Number: [id number] Patient Account Number: [id number] Date of Birth/Sex: 07-Jan-1928 (84 y.o. F) Treating RN: Montey Hora Primary Care Guinevere Stephenson: Park Liter Other Clinician: Referring Dimitris Shanahan: Park Liter Treating Katy Brickell/Extender: Tito Dine in Treatment: 10 Active Inactive ` Necrotic Tissue Nursing Diagnoses: Impaired tissue integrity related to necrotic/devitalized tissue Goals: Necrotic/devitalized tissue will be minimized in the Carol bed Date Initiated: 02/12/2018 Target Resolution Date: 03/07/2018 Goal Status: Active Interventions: Provide education on necrotic tissue and debridement process Treatment Activities: Apply topical anesthetic as ordered : 02/12/2018 Notes: ` Orientation to the Carol Care Program Nursing Diagnoses: Knowledge deficit related to the Carol healing center program Goals: Patient/caregiver will verbalize understanding of the  [place] Date Initiated: 02/12/2018 Target Resolution Date: 03/07/2018 Goal Status: Active Interventions: Provide education on orientation to the Carol center Notes: ` Pain, Acute or Chronic Nursing Diagnoses: Potential alteration in comfort, pain Goals: Patient will verbalize adequate pain control and receive pain control interventions during procedures as needed Date Initiated: 02/12/2018 Target Resolution Date: 03/07/2018 [name], [name] ([id number]) Goal Status: Active Interventions: Reposition patient for comfort Notes: ` Carol/Skin Impairment Nursing Diagnoses: Impaired tissue integrity Goals: Ulcer/skin breakdown will have a volume reduction of 80% by week 12 Date Initiated: 02/12/2018 Target Resolution Date: 05/15/2018 Goal Status: Active Interventions: Assess ulceration(s) every visit Treatment Activities: Patient referred to home care : 02/12/2018 Notes: Electronic Signature(s) Signed: 04/23/2018 5:18:29 PM By: Montey Hora Entered By: Montey Hora on 04/23/2018 14:34:48 [name] ([id number]) -------------------------------------------------------------------------------- Pain Assessment Wise Patient Name: [name] Date of Service: 04/23/2018 1:45 PM Medical Record Number: [id number] Patient Account Number: [id number] Date of Birth/Sex: 1928/06/20 (84 y.o. F) Treating RN: Ahmed Prima Primary Care Khloe Hunkele: Park Liter Other Clinician: Referring Briceson Broadwater: Park Liter Treating Bettejane Leavens/Extender: Tito Dine in Treatment: 10 Active Problems Location of Pain Severity and Description of Pain Patient Has Paino No Site Locations Pain Management and Medication Current Pain Management: Electronic Signature(s) Signed: 04/23/2018 4:43:18 PM By: Alric Quan Entered By: Alric Quan on 04/23/2018 14:04:18 [place], [place]  ([id number]) -------------------------------------------------------------------------------- Patient/Caregiver  Education Wise Patient Name: [name], [name] Date of Service: 04/23/2018 1:45 PM Medical Record Number: [id number] Patient Account Number: [id number] Date of Birth/Gender: 13-May-1928 (84 y.o. F) Treating RN: Montey Hora Primary Care Physician: Park Liter Other Clinician: Referring Physician: Park Liter Treating Physician/Extender: Tito Dine in Treatment: 10 Education Assessment Education Provided To: Patient Education Topics Provided Carol/Skin Impairment: Handouts: Other: Carol care as ordered Methods: Demonstration, Explain/Verbal Responses: State content correctly Electronic Signature(s) Signed: 04/23/2018 5:18:29 PM By: Montey Hora Entered By: Montey Hora on 04/23/2018 15:09:59 [name] ([id number]) -------------------------------------------------------------------------------- [name] Patient Name: [name] Date of Service: 04/23/2018 1:45 PM Medical Record Number: [id number] Patient Account Number: [id number] Date of Birth/Sex: 02-04-1928 (84 y.o. F) Treating RN: Montey Hora Primary Care Saleem Coccia: Park Liter Other Clinician: Referring Cuyler [name]: Park Liter Treating Almon Whitford/Extender: Tito Dine in Treatment: 10 Carol Status Carol Number: 2 Primary Trauma, Other Etiology: Carol Location: Right Forearm Carol Healed - Epithelialized Wounding Event: Trauma Status: Date Acquired: 03/03/2018 Comorbid Cataracts, Glaucoma, Asthma, Chronic Weeks Of Treatment: 7 History: Obstructive Pulmonary Disease (COPD), Clustered Carol: No Arrhythmia, Hypertension Photos Photo Uploaded By: Alric Quan on 04/23/2018 16:28:05 Carol Measurements Length: (cm) 0 % Re Width: (cm) 0 % Re Depth: (cm) 0 Epit Area: (cm) 0 Tun Volume: (cm) 0 Und duction in Area: 100% duction in  Volume: 100% helialization: Large (67-100%) neling: No ermining: No Carol Description Classification: Partial Thickness Foul Carol Margin: Distinct, outline attached Slou Exudate Amount: None Present Odor After Cleansing: No gh/Fibrino No Carol Bed Granulation Amount: None Present (0%) Exposed Structure Necrotic Amount: None Present (0%) Fascia Exposed: No Fat Layer (Subcutaneous Tissue) Exposed: No Tendon Exposed: No Muscle Exposed: No Joint Exposed: No Bone Exposed: No Periwound Skin Texture Texture Color No Abnormalities Noted: No No Abnormalities Noted: No [name], [name] ([id number]) Callus: No Atrophie Blanche: No Crepitus: No Cyanosis: No Excoriation: No Ecchymosis: No Induration: No Erythema: Yes Rash: No Erythema Location: Circumferential Scarring: No Hemosiderin Staining: No Mottled: No Moisture Pallor: No No Abnormalities Noted: No Rubor: No Dry / Scaly: No Maceration: No Temperature / Pain Temperature: No Abnormality Tenderness on Palpation: Yes Carol Preparation Ulcer Cleansing: Rinsed/Irrigated with Saline Topical Anesthetic Applied: None Electronic Signature(s) Signed: 04/23/2018 5:18:29 PM By: Montey Hora Entered By: Montey Hora on 04/23/2018 14:34:17 [name], [name] ([id number]) -------------------------------------------------------------------------------- [name] Patient Name: [name] Date of Service: 04/23/2018 1:45 PM Medical Record Number: [id number] Patient Account Number: [id number] Date of Birth/Sex: 12-Jan-1928 (84 y.o. F) Treating RN: Ahmed Prima Primary Care Sylvio Weatherall: Park Liter Other Clinician: Referring Derron Pipkins: Park Liter Treating Taila Basinski/Extender: Tito Dine in Treatment: 10 Carol Status Carol Number: 4 Primary Trauma, Other Etiology: Carol Location: Right Forearm - Proximal Carol Open Wounding Event: Trauma Status: Date Acquired: 04/21/2018 Comorbid  Cataracts, Glaucoma, Asthma, Chronic Weeks Of Treatment: 0 History: Obstructive Pulmonary Disease (COPD), Clustered Carol: No Arrhythmia, Hypertension Photos Photo Uploaded By: Alric Quan on 04/23/2018 16:29:30 Carol Measurements Length: (cm) 1.5 Width: (cm) 0.4 Depth: (cm) 0.1 Area: (cm) 0.471 Volume: (cm) 0.047 % Reduction in Area: % Reduction in Volume: Epithelialization: None Tunneling: No Undermining: No Carol Description Full Thickness Without Exposed Support Classification: Structures Carol Margin: Distinct, outline attached Exudate Large Amount: Exudate Type: Serosanguineous Exudate Color: red, brown Foul Odor After Cleansing: No Slough/Fibrino Yes Carol Bed Granulation Amount: None Present (0%) Exposed Structure Necrotic Amount: Large (67-100%) Fascia Exposed: No Necrotic Quality: Adherent Slough Fat Layer (Subcutaneous Tissue) Exposed: No Tendon Exposed: No Muscle Exposed: No Joint Exposed: No Bone  Exposed: No [name], [name] ([id number]) Periwound Skin Texture Texture Color No Abnormalities Noted: No No Abnormalities Noted: No Moisture Temperature / Pain No Abnormalities Noted: No Temperature: No Abnormality Tenderness on Palpation: Yes Carol Preparation Ulcer Cleansing: Rinsed/Irrigated with Saline Topical Anesthetic Applied: Other: lidocaine 4%, Treatment Notes Carol #4 (Right, Proximal Forearm) 1. Cleansed with: Clean Carol with Normal Saline 4. Dressing Applied: Calcium Alginate with Silver 5. Secondary Dressing Applied Kerlix/Conform Non-Adherent pad Notes netting Electronic Signature(s) Signed: 04/23/2018 4:43:18 PM By: Alric Quan Entered By: Alric Quan on 04/23/2018 14:14:36 [name], [name] ([id number]) -------------------------------------------------------------------------------- [place] Wise Patient Name: [name] Date of Service: 04/23/2018 1:45 PM Medical Record Number: [id number] Patient  Account Number: [id number] Date of Birth/Sex: [birth year] (84 y.o. F) Treating RN: Ahmed Prima Primary Care Rose Hippler: Park Liter Other Clinician: Referring Kolina Kube: Park Liter Treating Mckynlee Luse/Extender: Tito Dine in Treatment: 10 Vital Signs Time Taken: 14:04 Temperature (F): 97.7 Height (in): 60 Pulse (bpm): 60 Weight (lbs): 94.2 Respiratory Rate (breaths/min): 16 Body Mass Index (BMI): 18.4 Blood Pressure (mmHg): 154/51 Reference Range: 80 - 120 mg / dl Electronic Signature(s) Signed: 04/23/2018 4:43:18 PM By: Alric Quan Entered By: Alric Quan on 04/23/2018 14:05:47

## 2018-05-06 ENCOUNTER — Telehealth: Payer: Self-pay | Admitting: Cardiovascular Disease

## 2018-05-06 NOTE — Telephone Encounter (Signed)
Patient calling to discuss recent echo testing results  ° °Please call  ° °

## 2018-05-06 NOTE — Telephone Encounter (Signed)
Results of recent echo given to patient's daughter, who is listed on Healthcare POA.  "Echo Normal LV function Mildly elevated right heart pressures Would consider taking Lasix 20 milligram with potassium 10 mEq every other day This might help her breathing"  Patient mostly vegetarian. Patient does not drink much water, only when taking her medicines. Daughter is concerned since she only weighs 89lb with her taking lasix every other day. Says patient does not get short of breath often unless she is vacuuming or doing some kind of housework. Patient is working with pulmonology at this time to possible get home O2 again. Advised I will make Dr Rockey Situ aware and seek further advice on that.

## 2018-05-07 ENCOUNTER — Ambulatory Visit: Payer: Medicare (Managed Care)

## 2018-05-07 ENCOUNTER — Telehealth: Payer: Self-pay | Admitting: Internal Medicine

## 2018-05-07 ENCOUNTER — Encounter: Payer: Medicare Other | Attending: Internal Medicine | Admitting: Internal Medicine

## 2018-05-07 DIAGNOSIS — S41111D Laceration without foreign body of right upper arm, subsequent encounter: Secondary | ICD-10-CM | POA: Insufficient documentation

## 2018-05-07 DIAGNOSIS — I10 Essential (primary) hypertension: Secondary | ICD-10-CM | POA: Insufficient documentation

## 2018-05-07 DIAGNOSIS — J449 Chronic obstructive pulmonary disease, unspecified: Secondary | ICD-10-CM | POA: Diagnosis not present

## 2018-05-07 DIAGNOSIS — X58XXXD Exposure to other specified factors, subsequent encounter: Secondary | ICD-10-CM | POA: Insufficient documentation

## 2018-05-07 DIAGNOSIS — S51811A Laceration without foreign body of right forearm, initial encounter: Secondary | ICD-10-CM | POA: Diagnosis not present

## 2018-05-07 NOTE — Telephone Encounter (Signed)
Order changed. Awaiting MD signature then will be processed. Nothing further needed.

## 2018-05-07 NOTE — Telephone Encounter (Signed)
Received o2 order .  Please change order to continuous not on exertion and refax.

## 2018-05-08 ENCOUNTER — Ambulatory Visit: Payer: Medicare (Managed Care)

## 2018-05-08 DIAGNOSIS — S41111D Laceration without foreign body of right upper arm, subsequent encounter: Secondary | ICD-10-CM | POA: Diagnosis not present

## 2018-05-09 DIAGNOSIS — J449 Chronic obstructive pulmonary disease, unspecified: Secondary | ICD-10-CM | POA: Diagnosis not present

## 2018-05-09 DIAGNOSIS — J432 Centrilobular emphysema: Secondary | ICD-10-CM | POA: Diagnosis not present

## 2018-05-11 NOTE — Progress Notes (Signed)
ANIA, LEVAY ([id number]) Visit Report for 05/07/2018 Debridement Details Patient Name: QUIANA, COBAUGH Date of Service: 05/07/2018 3:00 PM Medical Record Number: [id number] Patient Account Number: [id number] Date of Birth/Sex: 1927/09/12 (84 y.o. F) Treating RN: [name] Barman Primary Care Provider: Park Liter Other Clinician: Referring Provider: Park Liter Treating Provider/Extender: Tito Dine in Treatment: 12 Debridement Performed for Wound #4 Right,Proximal Forearm Assessment: Performed By: Physician Ricard Dillon, MD Debridement Type: Debridement Pre-procedure Verification/Time Yes - 15:17 Out Taken: Start Time: 15:17 Pain Control: Other : lidocaine 4% Total Area Debrided (L x W): 1.8 (cm) x 0.4 (cm) = 0.72 (cm) Tissue and other material Viable, Non-Viable, Slough, Subcutaneous, Slough debrided: Level: Skin/Subcutaneous Tissue Debridement Description: Excisional Instrument: Curette Bleeding: Minimum Hemostasis Achieved: Pressure End Time: 15:18 Response to Treatment: Procedure was tolerated well Level of Consciousness: Awake and Alert Post Debridement Measurements of Total Wound Length: (cm) 1.8 Width: (cm) 0.4 Depth: (cm) 0.2 Volume: (cm) 0.113 Character of Wound/Ulcer Post Debridement: Requires Further Debridement Post Procedure Diagnosis Same as Pre-procedure Electronic Signature(s) Signed: 05/07/2018 4:51:46 PM By: Linton Ham MD Signed: 05/08/2018 5:22:10 PM By: Gretta Cool, BSN, RN, CWS, Kim RN, BSN Entered By: Linton Ham on 05/07/2018 15:48:23 [name] ([id number]) -------------------------------------------------------------------------------- HPI Details Patient Name: [name] Date of Service: 05/07/2018 3:00 PM Medical Record Number: [id number] Patient Account Number: [id number] Date of Birth/Sex: 30-Dec-1927 (84 y.o. F) Treating RN: [name] Barman Primary Care Provider: Park Liter Other  Clinician: Referring Provider: Park Liter Treating Provider/Extender: Tito Dine in Treatment: 12 History of Present Illness HPI Description: 02/12/18 ADMISSION This is an 84 year old woman who is recently moved to [place] from [place]. Her story began in February where she fell on some ice suffering extensive lacerations to her bilateral lower extremities. She is able to show me extensive pictures of the right lower leg but with going to a wound care center and [place] 3 times a week these eventually closed over. She has been left with 1 area on the upper lateral left calf. She has been applying Neosporin to this and applying a bandage. Currently this measures 2 x 1.5 cm. The patient is not a diabetic. She is an ex-smoker quitting 40 years ago. She does have COPD by description. ABIs in our clinic were 1.32 on the right and 1.03 on the left 02/19/18; right lower leg wound which was initially trauma. The area that we look that last week is smaller. Still covered in a nonviable surface however. We are using Iodoflex 02/26/18; right lower leg wound which was initially trauma in the setting of chronic venous insufficiency. Surface of the wound looks much better healthy granulation advancing epithelialization. We have good edema control 03/05/18; right lower leg wound which was initially trauma in the setting of chronic venous insufficiency. She continues to make nice progress here. We have good edema control oHe arrives today with a new traumatic laceration on her right dorsal forearm which she states happened while she was putting on a sweatshirt 03/12/18; right lower leg wound as closed however it still looks vulnerable. She has chronic venous insufficiency. oThe new wound from last week a traumatic area on her right dorsal or arm unfortunately does not have a viable surface. I remove some nonviable skin and necrotic subcutaneous debris from the wound surface. Hemostasis with  silver nitrate and direct pressure 03/19/18; right lower leg wound is closed. She has chronic venous insufficiency and will need ongoing compression oThe new wounds from 2 weeks ago on the right  upper elbow is closed. The area on her right dorsal forearm continues to have a nonviable surface requiring debridement 03/26/18; we'll need to look into ongoing compression for her legs. 2 wounds on the right upper elbow is closed. The area on the right dorsal forearm looks better. Hydrofera Blue secondary to hypertrophic granulation 04/02/18; he has compression stockings although she did not wear them today. Severe chronic venous insufficiency. She has 2 wounds on the right upper elbow which I said were closed last week which actually are although they're very small and she has a smaller clean wound on the right dorsal forearm. We've been using Hydrofera Blue secondary to some upper granulation. All of this looks better 04/09/18-She is seen in follow up evaluation for a right elbow and right forearm skin tear. The right elbow is healed. We will continue with same treatment plan and she'll follow next week 04/16/18-She is seen in follow-up evaluation for right forearm skin tear, this is essentially healed. We will cover with foam border and she will follow-up next week 04/23/18; the patient arrives with a new skin tear on her right dorsal arm just below the elbow. She got this while carrying a box. She has a linear skin tear. There is no depth I don't think this should've been sutured. There is a skin flap medially I'm not sure if this will be viable or not 04/30/18; the new skin tear from last week unfortunately has remained viable in terms of the skin flap. She has a small open area that looks healthy. Using moistened silver collagen 05/07/18; right dorsal arm skin tear. Nonviable tissue over the remnants of the wound. We've been using silver collagen Electronic Signature(s) Signed: 05/07/2018 4:51:46 PM By:  Linton Ham MD Entered By: Linton Ham on 05/07/2018 15:49:09 ROKHAYA, QUINN ([id number]) MAKYA, PHILLIS ([id number]) -------------------------------------------------------------------------------- Physical Exam Details Patient Name: [name] Date of Service: 05/07/2018 3:00 PM Medical Record Number: [id number] Patient Account Number: [id number] Date of Birth/Sex: 1928-05-21 (84 y.o. F) Treating RN: [name] Barman Primary Care Provider: Park Liter Other Clinician: Referring Provider: Park Liter Treating Provider/Extender: Tito Dine in Treatment: 12 Constitutional Sitting or standing Blood Pressure is within target range for patient.. Pulse regular and within target range for patient.Marland Kitchen Respirations regular, non-labored and within target range.. Temperature is normal and within the target range for the patient.. No change in height.Marland Kitchen appears in no distress. Notes wound exam; the new area on the dorsal forearm has adherent necrotic debris over the wound surface. This is disappointing. Using a #3 curet the debris was removed. Nonviable subcutaneous tissue. Hemostasis with direct pressure Electronic Signature(s) Signed: 05/07/2018 4:51:46 PM By: Linton Ham MD Entered By: Linton Ham on 05/07/2018 15:51:56 [name] ([id number]) -------------------------------------------------------------------------------- Physician Orders Details Patient Name: [name] Date of Service: 05/07/2018 3:00 PM Medical Record Number: [id number] Patient Account Number: [id number] Date of Birth/Sex: [birth year] (84 y.o. F) Treating RN: [name] Barman Primary Care Provider: Park Liter Other Clinician: Referring Provider: Park Liter Treating Provider/Extender: Tito Dine in Treatment: 57 Verbal / Phone Orders: No Diagnosis Coding Wound Cleansing Wound #4 Right,Proximal Forearm o Cleanse wound with mild soap and water o May  Shower, gently pat wound dry prior to applying new dressing. Anesthetic (add to Medication List) Wound #4 Right,Proximal Forearm o Topical Lidocaine 4% cream applied to wound bed prior to debridement (In Clinic Only). Primary Wound Dressing Wound #4 Right,Proximal Forearm o Silver Collagen Secondary Dressing Wound #4 Right,Proximal Forearm o Dry Gauze o Conform/Kerlix Dressing  Change Frequency Wound #4 Right,Proximal Forearm o Other: - twice weekly Follow-up Appointments Wound #4 Right,Proximal Forearm o Return Appointment in 2 weeks. Electronic Signature(s) Signed: 05/07/2018 4:51:46 PM By: Linton Ham MD Signed: 05/08/2018 5:22:10 PM By: Gretta Cool, BSN, RN, CWS, Kim RN, BSN Entered By: Gretta Cool, BSN, RN, CWS, Kim on 05/07/2018 15:19:56 EVERLYNN, SAGUN ([id number]) -------------------------------------------------------------------------------- Problem List Details Patient Name: [name] Date of Service: 05/07/2018 3:00 PM Medical Record Number: [id number] Patient Account Number: [id number] Date of Birth/Sex: 07-Dec-1927 (84 y.o. F) Treating RN: [name] Barman Primary Care Provider: Park Liter Other Clinician: Referring Provider: Park Liter Treating Provider/Extender: Tito Dine in Treatment: 12 Active Problems ICD-10 Evaluated Encounter Code Description Active Date Today Diagnosis S41.111D Laceration without foreign body of right upper arm, 03/05/2018 No Yes subsequent encounter Inactive Problems Resolved Problems ICD-10 Code Description Active Date Resolved Date L97.222 Non-pressure chronic ulcer of left calf with fat layer exposed 02/12/2018 02/12/2018 I87.311 Chronic venous hypertension (idiopathic) with ulcer of right lower 02/12/2018 02/12/2018 extremity S81.812D Laceration without foreign body, left lower leg, subsequent 02/12/2018 02/12/2018 encounter Electronic Signature(s) Signed: 05/07/2018 4:51:46 PM By: Linton Ham MD Entered By:  Linton Ham on 05/07/2018 15:47:56 [place], Helyn ([id number]) -------------------------------------------------------------------------------- Progress Note Details Patient Name: [name] Date of Service: 05/07/2018 3:00 PM Medical Record Number: [id number] Patient Account Number: [id number] Date of Birth/Sex: 1927/09/18 (84 y.o. F) Treating RN: [name] Barman Primary Care Provider: Park Liter Other Clinician: Referring Provider: Park Liter Treating Provider/Extender: Tito Dine in Treatment: 12 Subjective History of Present Illness (HPI) 02/12/18 ADMISSION This is an 84 year old woman who is recently moved to [place] from [place]. Her story began in February where she fell on some ice suffering extensive lacerations to her bilateral lower extremities. She is able to show me extensive pictures of the right lower leg but with going to a wound care center and [place] 3 times a week these eventually closed over. She has been left with 1 area on the upper lateral left calf. She has been applying Neosporin to this and applying a bandage. Currently this measures 2 x 1.5 cm. The patient is not a diabetic. She is an ex-smoker quitting 40 years ago. She does have COPD by description. ABIs in our clinic were 1.32 on the right and 1.03 on the left 02/19/18; right lower leg wound which was initially trauma. The area that we look that last week is smaller. Still covered in a nonviable surface however. We are using Iodoflex 02/26/18; right lower leg wound which was initially trauma in the setting of chronic venous insufficiency. Surface of the wound looks much better healthy granulation advancing epithelialization. We have good edema control 03/05/18; right lower leg wound which was initially trauma in the setting of chronic venous insufficiency. She continues to make nice progress here. We have good edema control He arrives today with a new traumatic laceration on her  right dorsal forearm which she states happened while she was putting on a sweatshirt 03/12/18; right lower leg wound as closed however it still looks vulnerable. She has chronic venous insufficiency. The new wound from last week a traumatic area on her right dorsal or arm unfortunately does not have a viable surface. I remove some nonviable skin and necrotic subcutaneous debris from the wound surface. Hemostasis with silver nitrate and direct pressure 03/19/18; right lower leg wound is closed. She has chronic venous insufficiency and will need ongoing compression The new wounds from 2 weeks ago on the right upper elbow is  closed. The area on her right dorsal forearm continues to have a nonviable surface requiring debridement 03/26/18; we'll need to look into ongoing compression for her legs. 2 wounds on the right upper elbow is closed. The area on the right dorsal forearm looks better. Hydrofera Blue secondary to hypertrophic granulation 04/02/18; he has compression stockings although she did not wear them today. Severe chronic venous insufficiency. She has 2 wounds on the right upper elbow which I said were closed last week which actually are although they're very small and she has a smaller clean wound on the right dorsal forearm. We've been using Hydrofera Blue secondary to some upper granulation. All of this looks better 04/09/18-She is seen in follow up evaluation for a right elbow and right forearm skin tear. The right elbow is healed. We will continue with same treatment plan and she'll follow next week 04/16/18-She is seen in follow-up evaluation for right forearm skin tear, this is essentially healed. We will cover with foam border and she will follow-up next week 04/23/18; the patient arrives with a new skin tear on her right dorsal arm just below the elbow. She got this while carrying a box. She has a linear skin tear. There is no depth I don't think this should've been sutured. There is a  skin flap medially I'm not sure if this will be viable or not 04/30/18; the new skin tear from last week unfortunately has remained viable in terms of the skin flap. She has a small open area that looks healthy. Using moistened silver collagen 05/07/18; right dorsal arm skin tear. Nonviable tissue over the remnants of the wound. We've been using silver collagen Spenser, Tola ([id number]) Objective Constitutional Sitting or standing Blood Pressure is within target range for patient.. Pulse regular and within target range for patient.Marland Kitchen Respirations regular, non-labored and within target range.. Temperature is normal and within the target range for the patient.. No change in height.Marland Kitchen appears in no distress. Vitals Time Taken: 2:55 PM, Height: 60 in, Weight: 94.2 lbs, BMI: 18.4, Temperature: 97.9 F, Pulse: 75 bpm, Respiratory Rate: 16 breaths/min, Blood Pressure: 122/51 mmHg. General Notes: wound exam; the new area on the dorsal forearm has adherent necrotic debris over the wound surface. This is disappointing. Using a #3 curet the debris was removed. Nonviable subcutaneous tissue. Hemostasis with direct pressure Integumentary (Hair, Skin) Wound #4 status is Open. Original cause of wound was Trauma. The wound is located on the Right,Proximal Forearm. The wound measures 1.8cm length x 0.4cm width x 0.1cm depth; 0.565cm^2 area and 0.057cm^3 volume. There is no tunneling or undermining noted. There is a small amount of purulent drainage noted. The wound margin is distinct with the outline attached to the wound base. There is small (1-33%) red, pink granulation within the wound bed. There is a large (67-100%) amount of necrotic tissue within the wound bed including Adherent Slough. The periwound skin appearance had no abnormalities noted for texture. The periwound skin appearance exhibited: Erythema. The periwound skin appearance did not exhibit: Dry/Scaly, Maceration, Atrophie Blanche, Cyanosis,  Ecchymosis, Hemosiderin Staining, Mottled, Pallor, Rubor. The surrounding wound skin color is noted with erythema which is circumferential. Periwound temperature was noted as No Abnormality. The periwound has tenderness on palpation. Assessment Active Problems ICD-10 Laceration without foreign body of right upper arm, subsequent encounter Procedures Wound #4 Pre-procedure diagnosis of Wound #4 is a Trauma, Other located on the Right,Proximal Forearm . There was a Excisional Skin/Subcutaneous Tissue Debridement with a total area of 0.72 sq  cm performed by Ricard Dillon, MD. With the following instrument(s): Curette to remove Viable and Non-Viable tissue/material. Material removed includes Subcutaneous Tissue and Slough and after achieving pain control using Other (lidocaine 4%). No specimens were taken. A time out was conducted at 15:17, prior to the start of the procedure. A Minimum amount of bleeding was controlled with Pressure. The procedure was tolerated well. Patient s Level of Consciousness post procedure was recorded as Awake and Alert. Post Debridement Measurements: 1.8cm length x 0.4cm width x 0.2cm depth; 0.113cm^3 volume. Character of Wound/Ulcer Post Debridement requires further debridement. Post procedure Diagnosis Wound #4: Same as Pre-Procedure Hammers, Idaly ([id number]) Plan Wound Cleansing: Wound #4 Right,Proximal Forearm: Cleanse wound with mild soap and water May Shower, gently pat wound dry prior to applying new dressing. Anesthetic (add to Medication List): Wound #4 Right,Proximal Forearm: Topical Lidocaine 4% cream applied to wound bed prior to debridement (In Clinic Only). Primary Wound Dressing: Wound #4 Right,Proximal Forearm: Silver Collagen Secondary Dressing: Wound #4 Right,Proximal Forearm: Dry Gauze Conform/Kerlix Dressing Change Frequency: Wound #4 Right,Proximal Forearm: Other: - twice weekly Follow-up Appointments: Wound #4 Right,Proximal  Forearm: Return Appointment in 2 weeks. #1 Silver collagen to continue to the wound area, kerlix and conform Electronic Signature(s) Signed: 05/07/2018 4:51:46 PM By: Linton Ham MD Entered By: Linton Ham on 05/07/2018 15:53:30 [place], Joycelyn Schmid ([id number]) -------------------------------------------------------------------------------- SuperBill Details Patient Name: [name] Date of Service: 05/07/2018 Medical Record Number: [id number] Patient Account Number: [id number] Date of Birth/Sex: June 24, 1928 (84 y.o. F) Treating RN: [name] Barman Primary Care Provider: Park Liter Other Clinician: Referring Provider: Park Liter Treating Provider/Extender: Tito Dine in Treatment: 12 Diagnosis Coding ICD-10 Codes Code Description S41.111D Laceration without foreign body of right upper arm, subsequent encounter Facility Procedures CPT4 Code: [id number] Description: 50037 - DEB SUBQ TISSUE 20 SQ CM/< ICD-10 Diagnosis Description S41.111D Laceration without foreign body of right upper arm, subsequ Modifier: ent encounter Quantity: 1 Physician Procedures CPT4 Code: [id number] Description: 69450 - WC PHYS SUBQ TISS 20 SQ CM ICD-10 Diagnosis Description S41.111D Laceration without foreign body of right upper arm, subsequ Modifier: ent encounter Quantity: 1 Electronic Signature(s) Signed: 05/07/2018 4:51:46 PM By: Linton Ham MD Entered By: Linton Ham on 05/07/2018 15:53:51

## 2018-05-11 NOTE — Telephone Encounter (Signed)
Okay to hold the Lasix and take only as needed for shortness of breath symptoms We will see what pulmonary says

## 2018-05-11 NOTE — Progress Notes (Signed)
[name] ([id number]) Visit Report for 05/07/2018 Arrival Information Details Patient Name: [name], [name] Date of Service: 05/07/2018 3:00 PM Medical Record Number: [id number] Patient Account Number: [id number] Date of Birth/Sex: 13-Jul-1928 (84 y.o. F) Treating RN: [name] Barman Primary Care Sereen Schaff: Park Liter Other Clinician: Referring Noor Vidales: Park Liter Treating Abbygale Lapid/Extender: Tito Dine in Treatment: 12 Visit Information History Since Last Visit Added or deleted any medications: No Patient Arrived: Ambulatory Any new allergies or adverse reactions: No Arrival Time: 14:58 Had a fall or experienced change in No Accompanied By: daughter activities of daily living that may affect Transfer Assistance: None risk of falls: Patient Requires Transmission-Based No Signs or symptoms of abuse/neglect since last visito No Precautions: Hospitalized since last visit: No Patient Has Alerts: No Implantable device outside of the clinic excluding No cellular tissue based products placed in the center since last visit: Has Dressing in Place as Prescribed: Yes Pain Present Now: No Electronic Signature(s) Signed: 05/08/2018 4:37:30 PM By: Lorine Bears RCP, RRT, CHT Entered By: Lorine Bears on 05/07/2018 14:59:12 Balm, [name] ([id number]) -------------------------------------------------------------------------------- Encounter Discharge Information Details Patient Name: [name] Date of Service: 05/07/2018 3:00 PM Medical Record Number: [id number] Patient Account Number: [id number] Date of Birth/Sex: [birth year] (84 y.o. F) Treating RN: Montey Hora Primary Care Saranne Crislip: Park Liter Other Clinician: Referring Susan Bleich: Park Liter Treating Derik Fults/Extender: Tito Dine in Treatment: 12 Encounter Discharge Information Items Discharge Condition: Stable Ambulatory Status: Ambulatory Discharge  Destination: Home Transportation: Private Auto Accompanied By: dtr Schedule Follow-up Appointment: Yes Clinical Summary of Care: Electronic Signature(s) Signed: 05/07/2018 4:48:49 PM By: Montey Hora Entered By: Montey Hora on 05/07/2018 16:48:48 [name] ([id number]) -------------------------------------------------------------------------------- Lower Extremity Assessment Details Patient Name: [name] Date of Service: 05/07/2018 3:00 PM Medical Record Number: [id number] Patient Account Number: [id number] Date of Birth/Sex: [birth year] (84 y.o. F) Treating RN: Secundino Ginger Primary Care Tabius Rood: Park Liter Other Clinician: Referring Kameah Rawl: Park Liter Treating Brinae Woods/Extender: Tito Dine in Treatment: 12 Electronic Signature(s) Signed: 05/07/2018 3:16:56 PM By: Secundino Ginger Entered By: Secundino Ginger on 05/07/2018 15:10:57 [name] ([id number]) -------------------------------------------------------------------------------- Multi Wound Chart Details Patient Name: [name] Date of Service: 05/07/2018 3:00 PM Medical Record Number: [id number] Patient Account Number: [id number] Date of Birth/Sex: 1928/03/30 (84 y.o. F) Treating RN: [name] Barman Primary Care Karver Fadden: Park Liter Other Clinician: Referring Carlina Derks: Park Liter Treating Laekyn Rayos/Extender: Tito Dine in Treatment: 12 Vital Signs Height(in): 60 Pulse(bpm): 75 Weight(lbs): 94.2 Blood Pressure(mmHg): 122/51 Body Mass Index(BMI): 18 Temperature(F): 97.9 Respiratory Rate 16 (breaths/min): Photos: [N/A:N/A] Wound Location: Right Forearm - Proximal N/A N/A Wounding Event: Trauma N/A N/A Primary Etiology: Trauma, Other N/A N/A Comorbid History: Cataracts, Glaucoma, Asthma, N/A N/A Chronic Obstructive Pulmonary Disease (COPD), Arrhythmia, Hypertension Date Acquired: 04/21/2018 N/A N/A Weeks of Treatment: 2 N/A N/A Wound Status: Open N/A  N/A Measurements L x W x D 1.8x0.4x0.1 N/A N/A (cm) Area (cm) : 0.565 N/A N/A Volume (cm) : 0.057 N/A N/A % Reduction in Area: -20.00% N/A N/A % Reduction in Volume: -21.30% N/A N/A Classification: Full Thickness Without N/A N/A Exposed Support Structures Exudate Amount: Small N/A N/A Exudate Type: Purulent N/A N/A Exudate Color: yellow, brown, green N/A N/A Wound Margin: Distinct, outline attached N/A N/A Granulation Amount: Small (1-33%) N/A N/A Granulation Quality: Red, Pink N/A N/A Necrotic Amount: Large (67-100%) N/A N/A Exposed Structures: Fascia: No N/A N/A Fat Layer (Subcutaneous Tissue) Exposed: No Tendon: No [name], [name] ([id number]) Muscle: No Joint: No Bone: No Epithelialization: None N/A N/A  Debridement: Debridement - Excisional N/A N/A Pre-procedure 15:17 N/A N/A Verification/Time Out Taken: Pain Control: Other N/A N/A Tissue Debrided: Subcutaneous, Slough N/A N/A Level: Skin/Subcutaneous Tissue N/A N/A Debridement Area (sq cm): 0.72 N/A N/A Instrument: Curette N/A N/A Bleeding: Minimum N/A N/A Hemostasis Achieved: Pressure N/A N/A Debridement Treatment Procedure was tolerated well N/A N/A Response: Post Debridement 1.8x0.4x0.2 N/A N/A Measurements L x W x D (cm) Post Debridement Volume: 0.113 N/A N/A (cm) Periwound Skin Texture: Excoriation: No N/A N/A Induration: No Callus: No Crepitus: No Rash: No Scarring: No Periwound Skin Moisture: Maceration: No N/A N/A Dry/Scaly: No Periwound Skin Color: Erythema: Yes N/A N/A Atrophie Blanche: No Cyanosis: No Ecchymosis: No Hemosiderin Staining: No Mottled: No Pallor: No Rubor: No Erythema Location: Circumferential N/A N/A Temperature: No Abnormality N/A N/A Tenderness on Palpation: Yes N/A N/A Wound Preparation: Ulcer Cleansing: N/A N/A Rinsed/Irrigated with Saline Topical Anesthetic Applied: Other: lidocaine 4% Procedures Performed: Debridement N/A N/A Treatment Notes Electronic  Signature(s) Signed: 05/07/2018 4:51:46 PM By: Linton Ham MD Entered By: Linton Ham on 05/07/2018 15:48:12 [name], [name] ([id number]) -------------------------------------------------------------------------------- Multi-Disciplinary Care Plan Details Patient Name: [name] Date of Service: 05/07/2018 3:00 PM Medical Record Number: [id number] Patient Account Number: [id number] Date of Birth/Sex: 25-Jan-1928 (84 y.o. F) Treating RN: [name] Barman Primary Care Mccoy Testa: Park Liter Other Clinician: Referring Theresia Pree: Park Liter Treating Sheng Pritz/Extender: Tito Dine in Treatment: 12 Active Inactive ` Necrotic Tissue Nursing Diagnoses: Impaired tissue integrity related to necrotic/devitalized tissue Goals: Necrotic/devitalized tissue will be minimized in the wound bed Date Initiated: 02/12/2018 Target Resolution Date: 03/07/2018 Goal Status: Active Interventions: Provide education on necrotic tissue and debridement process Treatment Activities: Apply topical anesthetic as ordered : 02/12/2018 Notes: ` Orientation to the Wound Care Program Nursing Diagnoses: Knowledge deficit related to the wound healing center program Goals: Patient/caregiver will verbalize understanding of the [place] Date Initiated: 02/12/2018 Target Resolution Date: 03/07/2018 Goal Status: Active Interventions: Provide education on orientation to the wound center Notes: ` Pain, Acute or Chronic Nursing Diagnoses: Potential alteration in comfort, pain Goals: Patient will verbalize adequate pain control and receive pain control interventions during procedures as needed Date Initiated: 02/12/2018 Target Resolution Date: 03/07/2018 [name], [name] ([id number]) Goal Status: Active Interventions: Reposition patient for comfort Notes: ` Wound/Skin Impairment Nursing Diagnoses: Impaired tissue integrity Goals: Ulcer/skin breakdown will have a volume  reduction of 80% by week 12 Date Initiated: 02/12/2018 Target Resolution Date: 05/15/2018 Goal Status: Active Interventions: Assess ulceration(s) every visit Treatment Activities: Patient referred to home care : 02/12/2018 Notes: Electronic Signature(s) Signed: 05/08/2018 5:22:10 PM By: Gretta Cool, BSN, RN, CWS, Kim RN, BSN Entered By: Gretta Cool, BSN, RN, CWS, Kim on 05/07/2018 15:16:41 [name] ([id number]) -------------------------------------------------------------------------------- Pain Assessment Details Patient Name: [name] Date of Service: 05/07/2018 3:00 PM Medical Record Number: [id number] Patient Account Number: [id number] Date of Birth/Sex: Sep 11, 1927 (84 y.o. F) Treating RN: [name] Barman Primary Care Harlean Regula: Park Liter Other Clinician: Referring Justinian Miano: Park Liter Treating Shaana Acocella/Extender: Tito Dine in Treatment: 12 Active Problems Location of Pain Severity and Description of Pain Patient Has Paino No Site Locations Pain Management and Medication Current Pain Management: Electronic Signature(s) Signed: 05/08/2018 4:37:30 PM By: Lorine Bears RCP, RRT, CHT Signed: 05/08/2018 5:22:10 PM By: Gretta Cool, BSN, RN, CWS, Kim RN, BSN Entered By: Lorine Bears on 05/07/2018 14:59:20 [name], [name] ([id number]) -------------------------------------------------------------------------------- Patient/Caregiver Education Details Patient Name: [name] Date of Service: 05/07/2018 3:00 PM Medical Record Number: [id number] Patient Account Number: [id number] Date of Birth/Gender: 12-May-1928 (  84 y.o. F) Treating RN: Montey Hora Primary Care Physician: Park Liter Other Clinician: Referring Physician: Park Liter Treating Physician/Extender: Tito Dine in Treatment: 12 Education Assessment Education Provided To: Patient Education Topics Provided Wound/Skin Impairment: Handouts: Other: wound  care as ordered Methods: Demonstration, Explain/Verbal Responses: State content correctly Electronic Signature(s) Signed: 05/07/2018 4:57:10 PM By: Montey Hora Entered By: Montey Hora on 05/07/2018 [name], [name] ([id number]) -------------------------------------------------------------------------------- Wound Assessment Details Patient Name: [name] Date of Service: 05/07/2018 3:00 PM Medical Record Number: [id number] Patient Account Number: [id number] Date of Birth/Sex: 08/10/1928 (84 y.o. F) Treating RN: Secundino Ginger Primary Care Jaquelyn Sakamoto: Park Liter Other Clinician: Referring Kenyatta Gloeckner: Park Liter Treating Khira Cudmore/Extender: Tito Dine in Treatment: 12 Wound Status Wound Number: 4 Primary Trauma, Other Etiology: Wound Location: Right Forearm - Proximal Wound Open Wounding Event: Trauma Status: Date Acquired: 04/21/2018 Comorbid Cataracts, Glaucoma, Asthma, Chronic Weeks Of Treatment: 2 History: Obstructive Pulmonary Disease (COPD), Clustered Wound: No Arrhythmia, Hypertension Photos Photo Uploaded By: Secundino Ginger on 05/07/2018 15:14:16 Wound Measurements Length: (cm) 1.8 Width: (cm) 0.4 Depth: (cm) 0.1 Area: (cm) 0.565 Volume: (cm) 0.057 % Reduction in Area: -20% % Reduction in Volume: -21.3% Epithelialization: None Tunneling: No Undermining: No Wound Description Full Thickness Without Exposed Support Foul Odo Classification: Structures Slough/F Wound Margin: Distinct, outline attached Exudate Small Amount: Exudate Type: Purulent Exudate Color: yellow, brown, green r After Cleansing: No ibrino Yes Wound Bed Granulation Amount: Small (1-33%) Exposed Structure Granulation Quality: Red, Pink Fascia Exposed: No Necrotic Amount: Large (67-100%) Fat Layer (Subcutaneous Tissue) Exposed: No Necrotic Quality: Adherent Slough Tendon Exposed: No Muscle Exposed: No Joint Exposed: No Bone Exposed: No [name], [name]  ([id number]) Periwound Skin Texture Texture Color No Abnormalities Noted: Yes No Abnormalities Noted: No Atrophie Blanche: No Moisture Cyanosis: No No Abnormalities Noted: No Ecchymosis: No Dry / Scaly: No Erythema: Yes Maceration: No Erythema Location: Circumferential Hemosiderin Staining: No Mottled: No Pallor: No Rubor: No Temperature / Pain Temperature: No Abnormality Tenderness on Palpation: Yes Wound Preparation Ulcer Cleansing: Rinsed/Irrigated with Saline Topical Anesthetic Applied: Other: lidocaine 4%, Treatment Notes Wound #4 (Right, Proximal Forearm) 1. Cleansed with: Clean wound with Normal Saline 2. Anesthetic Topical Lidocaine 4% cream to wound bed prior to debridement 4. Dressing Applied: Prisma Ag 5. Secondary Dressing Applied Kerlix/Conform Non-Adherent pad 7. Secured with Tape Notes netting Electronic Signature(s) Signed: 05/07/2018 3:16:56 PM By: Secundino Ginger Entered By: Secundino Ginger on 05/07/2018 15:10:48 [name] ([id number]) -------------------------------------------------------------------------------- Vitals Details Patient Name: [name] Date of Service: 05/07/2018 3:00 PM Medical Record Number: [id number] Patient Account Number: [id number] Date of Birth/Sex: [birth year] (84 y.o. F) Treating RN: [name] Barman Primary Care Skylinn Vialpando: Park Liter Other Clinician: Referring Xariah Silvernail: Park Liter Treating Supriya Beaston/Extender: Tito Dine in Treatment: 12 Vital Signs Time Taken: 14:55 Temperature (F): 97.9 Height (in): 60 Pulse (bpm): 75 Weight (lbs): 94.2 Respiratory Rate (breaths/min): 16 Body Mass Index (BMI): 18.4 Blood Pressure (mmHg): 122/51 Reference Range: 80 - 120 mg / dl Electronic Signature(s) Signed: 05/08/2018 4:37:30 PM By: Lorine Bears RCP, RRT, CHT Entered By: Lorine Bears on 05/07/2018 15:02:32

## 2018-05-12 MED ORDER — FUROSEMIDE 20 MG PO TABS: 20.0000 mg | ORAL_TABLET | Freq: Every day | ORAL | 6 refills | Status: DC | PRN

## 2018-05-12 MED ORDER — POTASSIUM CHLORIDE ER 10 MEQ PO TBCR: 10.0000 meq | EXTENDED_RELEASE_TABLET | Freq: Every day | ORAL | 6 refills | Status: DC | PRN

## 2018-05-12 NOTE — Telephone Encounter (Signed)
S/w daughter. She verbalized understanding for patient to take furosemide along with potassium as needed for shortness of breath. Rx sent to pharmacy.

## 2018-05-13 ENCOUNTER — Ambulatory Visit: Payer: Medicare (Managed Care) | Admitting: Family Medicine

## 2018-05-14 ENCOUNTER — Encounter: Payer: Medicare Other | Admitting: Internal Medicine

## 2018-05-15 ENCOUNTER — Encounter: Payer: Self-pay | Admitting: *Deleted

## 2018-05-15 ENCOUNTER — Telehealth: Payer: Self-pay | Admitting: *Deleted

## 2018-05-15 NOTE — Telephone Encounter (Signed)
Attempted to call patient with results of ONO She could not hear, there was someone in back ground telling her to put phone on other ear. She still couldn't hear.

## 2018-05-15 NOTE — Telephone Encounter (Signed)
ONO results  No need for 02 at this time.

## 2018-05-15 NOTE — Telephone Encounter (Signed)
Normal test result letter mailed.

## 2018-05-19 ENCOUNTER — Encounter: Payer: Self-pay | Admitting: Family Medicine

## 2018-05-19 ENCOUNTER — Ambulatory Visit (INDEPENDENT_AMBULATORY_CARE_PROVIDER_SITE_OTHER): Payer: Medicare Other

## 2018-05-19 ENCOUNTER — Ambulatory Visit (INDEPENDENT_AMBULATORY_CARE_PROVIDER_SITE_OTHER): Payer: Medicare Other | Admitting: Family Medicine

## 2018-05-19 VITALS — BP 126/56 | HR 69 | Temp 98.7°F | Ht 60.0 in | Wt 93.1 lb

## 2018-05-19 VITALS — BP 126/56 | HR 69 | Temp 98.7°F | Resp 16 | Ht 60.0 in | Wt 93.1 lb

## 2018-05-19 DIAGNOSIS — I10 Essential (primary) hypertension: Secondary | ICD-10-CM

## 2018-05-19 DIAGNOSIS — E039 Hypothyroidism, unspecified: Secondary | ICD-10-CM

## 2018-05-19 DIAGNOSIS — I701 Atherosclerosis of renal artery: Secondary | ICD-10-CM

## 2018-05-19 DIAGNOSIS — Z0000 Encounter for general adult medical examination without abnormal findings: Secondary | ICD-10-CM | POA: Diagnosis not present

## 2018-05-19 DIAGNOSIS — J432 Centrilobular emphysema: Secondary | ICD-10-CM | POA: Diagnosis not present

## 2018-05-19 DIAGNOSIS — Z13820 Encounter for screening for osteoporosis: Secondary | ICD-10-CM

## 2018-05-19 DIAGNOSIS — N3281 Overactive bladder: Secondary | ICD-10-CM

## 2018-05-19 DIAGNOSIS — Z13220 Encounter for screening for lipoid disorders: Secondary | ICD-10-CM

## 2018-05-19 DIAGNOSIS — F339 Major depressive disorder, recurrent, unspecified: Secondary | ICD-10-CM

## 2018-05-19 DIAGNOSIS — F419 Anxiety disorder, unspecified: Secondary | ICD-10-CM

## 2018-05-19 MED ORDER — METOPROLOL TARTRATE 25 MG PO TABS: 25.0000 mg | ORAL_TABLET | Freq: Two times a day (BID) | ORAL | 1 refills | Status: DC

## 2018-05-19 MED ORDER — METOPROLOL TARTRATE 50 MG PO TABS: 50.0000 mg | ORAL_TABLET | Freq: Two times a day (BID) | ORAL | 1 refills | Status: DC

## 2018-05-19 MED ORDER — ASPIRIN EC 81 MG PO TBEC: 81.0000 mg | DELAYED_RELEASE_TABLET | Freq: Every day | ORAL | 4 refills | Status: DC

## 2018-05-19 MED ORDER — AMLODIPINE BESYLATE 2.5 MG PO TABS: 2.5000 mg | ORAL_TABLET | Freq: Every day | ORAL | 1 refills | Status: DC

## 2018-05-19 NOTE — Assessment & Plan Note (Signed)
Under good control on current regimen. Continue current regimen. Continue to monitor. Call with any concerns. Refills given.   

## 2018-05-19 NOTE — Progress Notes (Signed)
BP (!) 126/56 (BP Location: Left Arm, Patient Position: Sitting, Cuff Size: Normal)   Pulse 69   Temp 98.7 F (37.1 C)   Ht 5' (1.524 m)   Wt 93 lb 1.6 oz (42.2 kg)   SpO2 93% Comment: 2 liters of oxygen  BMI 18.18 kg/m    Subjective:    Patient ID: [name], female    DOB: 1928/05/21, 84 y.o.   MRN: [id number]  HPI: [name] is a 84 y.o. female presenting on 05/19/2018 for comprehensive medical examination. Current medical complaints include:  Since her last visit she saw Dr. Rockey Situ for her atrial fibrillation- unclear based on the records about the a fib. He suspects that it was likely many years ago. Not anti-coagulated. Had an episode of SVT- doing well on metoprolol 75mg  BID. He got her set up with an ECHO as she hadn't had one previously. She had some mildly elevated R heart pressures. He started her on 20mg  lasix with potassium 79mEq every other day to help with her breathing- but she never picked up the Rx.  Referred to pulmonology- Dr. Ashby Dawes. He wants to get a 6 minute walk test and overnight Oxygen test. He stated her on advair and obtained an x-ray. X-ray showed COPD. 6 minute walk tested showed her O2 dropping to 87%. Oxygen ordered 2 weeks ago. Overnight oxygen was normal.   HYPERTENSION / HYPERLIPIDEMIA Satisfied with current treatment? yes Duration of hypertension: chronic BP monitoring frequency: not checking BP medication side effects: no Past BP meds: amlodipine, metoprolol Duration of hyperlipidemia: chronic Cholesterol medication side effects: not on anything Cholesterol supplements: none Past cholesterol medications: none Medication compliance: excellent compliance Aspirin: yes Recent stressors: no Recurrent headaches: no Visual changes: no Palpitations: no  Dyspnea: yes Chest pain: no Lower extremity edema: no Dizzy/lightheaded: no  DEPRESSION Mood status: controlled Satisfied with current treatment?: yes Symptom severity:  mild  Duration of current treatment : chronic Side effects: not on anything Psychotherapy/counseling: no  Depressed mood: no Anxious mood: no Anhedonia: no Significant weight loss or gain: no Insomnia: yes hard to fall asleep Fatigue: yes Feelings of worthlessness or guilt: no Impaired concentration/indecisiveness: no Suicidal ideations: no Hopelessness: no Crying spells: no Depression screen PHQ 2/9 05/19/2018  Decreased Interest 0  Down, Depressed, Hopeless 0  PHQ - 2 Score 0    She currently lives with: Daughter Menopausal Symptoms: no  Depression Screen done today and results listed below:  Depression screen [facility] 2/9 05/19/2018  Decreased Interest 0  Down, Depressed, Hopeless 0  PHQ - 2 Score 0    Past Medical History:  Past Medical History:  Diagnosis Date  . Anxiety   . Atrial fibrillation ([place])   . Clotting disorder (HCC)    left ankle  . COPD (chronic obstructive pulmonary disease) ([place])   . Depression   . Overactive bladder   . Thyroid disease     Surgical History:  Past Surgical History:  Procedure Laterality Date  . ABDOMINAL HYSTERECTOMY    . BREAST LUMPECTOMY      Medications:  Current Outpatient Medications on File Prior to Visit  Medication Sig  . Fluticasone-Salmeterol (ADVAIR DISKUS) 250-50 MCG/DOSE AEPB Inhale 1 puff into the lungs daily. Rinse mouth after use. (Patient not taking: Reported on 05/19/2018)  . furosemide (LASIX) 20 MG tablet Take 1 tablet (20 mg total) by mouth daily as needed. For shortness of breath.  . levothyroxine (SYNTHROID, LEVOTHROID) 100 MCG tablet Take 1 tablet (100 mcg total) by  mouth daily before breakfast.  . potassium chloride (K-DUR) 10 MEQ tablet Take 1 tablet (10 mEq total) by mouth daily as needed. When you take furosemide as needed.   No current facility-administered medications on file prior to visit.     Allergies:  No Known Allergies  Social History:  Social History   Socioeconomic History  .  Marital status: Divorced    Spouse name: Not on file  . Number of children: Not on file  . Years of education: Not on file  . Highest education level: High school graduate  Occupational History  . Not on file  Social Needs  . Financial resource strain: Somewhat hard  . Food insecurity:    Worry: Never true    Inability: Never true  . Transportation needs:    Medical: No    Non-medical: No  Tobacco Use  . Smoking status: Former Smoker    Years: 25.00    Types: Cigarettes    Last attempt to quit: 02/01/1986    Years since quitting: 32.3  . Smokeless tobacco: Never Used  . Tobacco comment: quit in 1987   Substance and Sexual Activity  . Alcohol use: Never    Frequency: Never  . Drug use: Never  . Sexual activity: Not Currently  Lifestyle  . Physical activity:    Days per week: 0 days    Minutes per session: 0 min  . Stress: Not at all  Relationships  . Social connections:    Talks on phone: More than three times a week    Gets together: More than three times a week    Attends religious service: Never    Active member of club or organization: No    Attends meetings of clubs or organizations: Never    Relationship status: Divorced  . Intimate partner violence:    Fear of current or ex partner: No    Emotionally abused: No    Physically abused: No    Forced sexual activity: No  Other Topics Concern  . Not on file  Social History Narrative   Has 3 daughters, 2 local    Social History   Tobacco Use  Smoking Status Former Smoker  . Years: 25.00  . Types: Cigarettes  . Last attempt to quit: 02/01/1986  . Years since quitting: 32.3  Smokeless Tobacco Never Used  Tobacco Comment   quit in 1987    Social History   Substance and Sexual Activity  Alcohol Use Never  . Frequency: Never    Family History:  Family History  Problem Relation Age of Onset  . Heart disease Mother   . Heart failure Mother   . Parkinson's disease Father   . Heart disease Sister   .  Heart disease Son     Past medical history, surgical history, medications, allergies, family history and social history reviewed with patient today and changes made to appropriate areas of the chart.   Review of Systems  Constitutional: Negative.   HENT: Positive for hearing loss. Negative for congestion, ear discharge, ear pain, nosebleeds, sinus pain, sore throat and tinnitus.   Eyes: Negative.   Respiratory: Positive for cough and shortness of breath. Negative for hemoptysis, sputum production, wheezing and stridor.   Cardiovascular: Negative.   Gastrointestinal: Negative.   Genitourinary: Negative for dysuria, flank pain, frequency, hematuria and urgency.       Doesn't go very often  Musculoskeletal: Positive for myalgias. Negative for back pain, falls, joint pain and neck pain.  Skin: Negative.   Neurological: Negative.   Endo/Heme/Allergies: Negative for environmental allergies and polydipsia. Bruises/bleeds easily.  Psychiatric/Behavioral: Negative.     All other ROS negative except what is listed above and in the HPI.      Objective:    BP (!) 126/56 (BP Location: Left Arm, Patient Position: Sitting, Cuff Size: Normal)   Pulse 69   Temp 98.7 F (37.1 C)   Ht 5' (1.524 m)   Wt 93 lb 1.6 oz (42.2 kg)   SpO2 93% Comment: 2 liters of oxygen  BMI 18.18 kg/m   Wt Readings from Last 3 Encounters:  05/19/18 93 lb 1.6 oz (42.2 kg)  05/19/18 93 lb 1.6 oz (42.2 kg)  04/28/18 92 lb (41.7 kg)    Physical Exam  Constitutional: She is oriented to person, place, and time. She appears well-developed and well-nourished. No distress.  HENT:  Head: Normocephalic and atraumatic.  Right Ear: Hearing, tympanic membrane, external ear and ear canal normal.  Left Ear: Hearing, tympanic membrane, external ear and ear canal normal.  Nose: Nose normal.  Mouth/Throat: Uvula is midline, oropharynx is clear and moist and mucous membranes are normal. No oropharyngeal exudate.  Eyes: Pupils are  equal, round, and reactive to light. Conjunctivae, EOM and lids are normal. Right eye exhibits no discharge. Left eye exhibits no discharge. No scleral icterus.  Neck: Normal range of motion. Neck supple. No JVD present. No tracheal deviation present. No thyromegaly present.  Cardiovascular: Normal rate, regular rhythm, normal heart sounds and intact distal pulses. Exam reveals no gallop and no friction rub.  No murmur heard. Pulmonary/Chest: Effort normal and breath sounds normal. No stridor. No respiratory distress. She has no wheezes. She has no rales. She exhibits no tenderness.  Abdominal: Soft. Bowel sounds are normal. She exhibits no distension and no mass. There is no tenderness. There is no rebound and no guarding. No hernia.  Genitourinary:  Genitourinary Comments: Breast and pelvic exams deferred with shared decision making  Musculoskeletal: Normal range of motion. She exhibits no edema, tenderness or deformity.  Lymphadenopathy:    She has no cervical adenopathy.  Neurological: She is alert and oriented to person, place, and time. She displays normal reflexes. No cranial nerve deficit or sensory deficit. She exhibits normal muscle tone. Coordination normal.  Skin: Skin is warm, dry and intact. Capillary refill takes less than 2 seconds. No rash noted. She is not diaphoretic. No erythema. No pallor.  Psychiatric: She has a normal mood and affect. Her speech is normal and behavior is normal. Judgment and thought content normal. Cognition and memory are normal.  Nursing note and vitals reviewed.   Results for orders placed or performed in visit on 03/26/18  TSH  Result Value Ref Range   TSH 3.780 0.450 - 4.500 uIU/mL      Assessment & Plan:   Problem List Items Addressed This Visit      Cardiovascular and Mediastinum   Essential hypertension    Under good control on current regimen. Continue current regimen. Continue to monitor. Call with any concerns. Refills given.         Relevant Medications   metoprolol tartrate (LOPRESSOR) 50 MG tablet   metoprolol tartrate (LOPRESSOR) 25 MG tablet   aspirin EC 81 MG tablet   amLODipine (NORVASC) 2.5 MG tablet   Other Relevant Orders   CBC with Differential/Platelet   Comprehensive metabolic panel   Microalbumin, Urine Waived   Renal artery stenosis (HCC)    Under good  control on current regimen. Continue current regimen. Continue to monitor. Call with any concerns. Refills given.        Relevant Medications   metoprolol tartrate (LOPRESSOR) 50 MG tablet   metoprolol tartrate (LOPRESSOR) 25 MG tablet   aspirin EC 81 MG tablet   amLODipine (NORVASC) 2.5 MG tablet   Other Relevant Orders   CBC with Differential/Platelet   Comprehensive metabolic panel   Microalbumin, Urine Waived     Respiratory   COPD (chronic obstructive pulmonary disease) (HCC)    Lungs clear. Continue to follow with pulmonology. Call with any concerns. Continue oxygen. Call with any concerns.       Relevant Orders   CBC with Differential/Platelet   Comprehensive metabolic panel     Endocrine   Hypothyroidism    Rechecking levels today. Will adjust as needed. Call with any concerns.       Relevant Medications   metoprolol tartrate (LOPRESSOR) 50 MG tablet   metoprolol tartrate (LOPRESSOR) 25 MG tablet   Other Relevant Orders   CBC with Differential/Platelet   Comprehensive metabolic panel   TSH     Genitourinary   Overactive bladder    Not peeing very often. Will try off vesicare as not covered and see how she does. If having issues, will try something else. Rx for depends sent to her pharmacy.      Relevant Orders   UA/M w/rflx Culture, Routine     Other   Depression    Under good control on current regimen. Continue current regimen. Continue to monitor. Call with any concerns. Refills given.        Relevant Orders   CBC with Differential/Platelet   Comprehensive metabolic panel   Anxiety    Under good control on  current regimen. Continue current regimen. Continue to monitor. Call with any concerns. Refills given.        Relevant Orders   CBC with Differential/Platelet   Comprehensive metabolic panel    Other Visit Diagnoses    Routine general medical examination at a health care facility    -  Primary   Vaccines up to date. Screening labs checked today. DEXA ordered today. Continue diet and exercise. Call with any concerns.    Screening for cholesterol level       Labs drawn today. Await results.    Relevant Orders   Lipid Panel w/o Chol/HDL Ratio   Screening for osteoporosis       DEXA ordered today.    Relevant Orders   DG Bone Density       Follow up plan: Return 1-2 months, for follow up overactive bladder.   LABORATORY TESTING:  - Pap smear: not applicable  IMMUNIZATIONS:   - Tdap: Tetanus vaccination status reviewed: last tetanus booster within 10 years. - Influenza: Postponed to flu season - Pneumovax: Administered today - Prevnar: Administered today  SCREENING: - Bone Density: Ordered today   PATIENT COUNSELING:   Advised to take 1 mg of folate supplement per day if capable of pregnancy.   Sexuality: Discussed sexually transmitted diseases, partner selection, use of condoms, avoidance of unintended pregnancy  and contraceptive alternatives.   Advised to avoid cigarette smoking.  I discussed with the patient that most people either abstain from alcohol or drink within safe limits (<=14/week and <=4 drinks/occasion for males, <=7/weeks and <= 3 drinks/occasion for females) and that the risk for alcohol disorders and other health effects rises proportionally with the number of drinks per week and how  often a drinker exceeds daily limits.  Discussed cessation/primary prevention of drug use and availability of treatment for abuse.   Diet: Encouraged to adjust caloric intake to maintain  or achieve ideal body weight, to reduce intake of dietary saturated fat and total fat,  to limit sodium intake by avoiding high sodium foods and not adding table salt, and to maintain adequate dietary potassium and calcium preferably from fresh fruits, vegetables, and low-fat dairy products.    stressed the importance of regular exercise  Injury prevention: Discussed safety belts, safety helmets, smoke detector, smoking near bedding or upholstery.   Dental health: Discussed importance of regular tooth brushing, flossing, and dental visits.    NEXT PREVENTATIVE PHYSICAL DUE IN 1 YEAR. Return 1-2 months, for follow up overactive bladder.

## 2018-05-19 NOTE — Assessment & Plan Note (Signed)
Lungs clear. Continue to follow with pulmonology. Call with any concerns. Continue oxygen. Call with any concerns.

## 2018-05-19 NOTE — Patient Instructions (Addendum)
[name] , Thank you for taking time to come for your Medicare Wellness Visit. I appreciate your ongoing commitment to your health goals. Please review the following plan we discussed and let me know if I can assist you in the future.   Screening recommendations/referrals: Colonoscopy: no longer required Mammogram: no longer required  Bone Density: due now Please call [phone] to schedule Recommended yearly ophthalmology/optometry visit for glaucoma screening and checkup Recommended yearly dental visit for hygiene and checkup  Vaccinations: Influenza vaccine: up to date Pneumococcal vaccine: completed series  Tdap vaccine: up to date Shingles vaccine: shingrix eligible, check with your insurance company for coverage     Advanced directives: Advance directive discussed with you today. I have provided a copy for you to complete at home and have notarized. Once this is complete please bring a copy in to our office so we can scan it into your chart.  Conditions/risks identified: Recommend drinking at least 6-8 glasses of water a day   Next appointment: Follow up in one year for your annual wellness exam.    Preventive Care 65 Years and Older, Female Preventive care refers to lifestyle choices and visits with your health care provider that can promote health and wellness. What does preventive care include?  A yearly physical exam. This is also called an annual well check.  Dental exams once or twice a year.  Routine eye exams. Ask your health care provider how often you should have your eyes checked.  Personal lifestyle choices, including:  Daily care of your teeth and gums.  Regular physical activity.  Eating a healthy diet.  Avoiding tobacco and drug use.  Limiting alcohol use.  Practicing safe sex.  Taking low-dose aspirin every day.  Taking vitamin and mineral supplements as recommended by your health care provider. What happens during an annual well  check? The services and screenings done by your health care provider during your annual well check will depend on your age, overall health, lifestyle risk factors, and family history of disease. Counseling  Your health care provider may ask you questions about your:  Alcohol use.  Tobacco use.  Drug use.  Emotional well-being.  Home and relationship well-being.  Sexual activity.  Eating habits.  History of falls.  Memory and ability to understand (cognition).  Work and work Statistician.  Reproductive health. Screening  You may have the following tests or measurements:  Height, weight, and BMI.  Blood pressure.  Lipid and cholesterol levels. These may be checked every 5 years, or more frequently if you are over 33 years old.  Skin check.  Lung cancer screening. You may have this screening every year starting at age 75 if you have a 30-pack-year history of smoking and currently smoke or have quit within the past 15 years.  Fecal occult blood test (FOBT) of the stool. You may have this test every year starting at age 45.  Flexible sigmoidoscopy or colonoscopy. You may have a sigmoidoscopy every 5 years or a colonoscopy every 10 years starting at age 47.  Hepatitis C blood test.  Hepatitis B blood test.  Sexually transmitted disease (STD) testing.  Diabetes screening. This is done by checking your blood sugar (glucose) after you have not eaten for a while (fasting). You may have this done every 1-3 years.  Bone density scan. This is done to screen for osteoporosis. You may have this done starting at age 53.  Mammogram. This may be done every 1-2 years. Talk to your health  care provider about how often you should have regular mammograms. Talk with your health care provider about your test results, treatment options, and if necessary, the need for more tests. Vaccines  Your health care provider may recommend certain vaccines, such as:  Influenza vaccine. This is  recommended every year.  Tetanus, diphtheria, and acellular pertussis (Tdap, Td) vaccine. You may need a Td booster every 10 years.  Zoster vaccine. You may need this after age 22.  Pneumococcal 13-valent conjugate (PCV13) vaccine. One dose is recommended after age 10.  Pneumococcal polysaccharide (PPSV23) vaccine. One dose is recommended after age 13. Talk to your health care provider about which screenings and vaccines you need and how often you need them. This information is not intended to replace advice given to you by your health care provider. Make sure you discuss any questions you have with your health care provider. Document Released: 09/16/2015 Document Revised: 05/09/2016 Document Reviewed: 06/21/2015 Elsevier Interactive Patient Education  2017 [place] Prevention in the Home Falls can cause injuries. They can happen to people of all ages. There are many things you can do to make your home safe and to help prevent falls. What can I do on the outside of my home?  Regularly fix the edges of walkways and driveways and fix any cracks.  Remove anything that might make you trip as you walk through a door, such as a raised step or threshold.  Trim any bushes or trees on the path to your home.  Use bright outdoor lighting.  Clear any walking paths of anything that might make someone trip, such as rocks or tools.  Regularly check to see if handrails are loose or broken. Make sure that both sides of any steps have handrails.  Any raised decks and porches should have guardrails on the edges.  Have any leaves, snow, or ice cleared regularly.  Use sand or salt on walking paths during winter.  Clean up any spills in your garage right away. This includes oil or grease spills. What can I do in the bathroom?  Use night lights.  Install grab bars by the toilet and in the tub and shower. Do not use towel bars as grab bars.  Use non-skid mats or decals in the tub or  shower.  If you need to sit down in the shower, use a plastic, non-slip stool.  Keep the floor dry. Clean up any water that spills on the floor as soon as it happens.  Remove soap buildup in the tub or shower regularly.  Attach bath mats securely with double-sided non-slip rug tape.  Do not have throw rugs and other things on the floor that can make you trip. What can I do in the bedroom?  Use night lights.  Make sure that you have a light by your bed that is easy to reach.  Do not use any sheets or blankets that are too big for your bed. They should not hang down onto the floor.  Have a firm chair that has side arms. You can use this for support while you get dressed.  Do not have throw rugs and other things on the floor that can make you trip. What can I do in the kitchen?  Clean up any spills right away.  Avoid walking on wet floors.  Keep items that you use a lot in easy-to-reach places.  If you need to reach something above you, use a strong step stool that has a grab  bar.  Keep electrical cords out of the way.  Do not use floor polish or wax that makes floors slippery. If you must use wax, use non-skid floor wax.  Do not have throw rugs and other things on the floor that can make you trip. What can I do with my stairs?  Do not leave any items on the stairs.  Make sure that there are handrails on both sides of the stairs and use them. Fix handrails that are broken or loose. Make sure that handrails are as long as the stairways.  Check any carpeting to make sure that it is firmly attached to the stairs. Fix any carpet that is loose or worn.  Avoid having throw rugs at the top or bottom of the stairs. If you do have throw rugs, attach them to the floor with carpet tape.  Make sure that you have a light switch at the top of the stairs and the bottom of the stairs. If you do not have them, ask someone to add them for you. What else can I do to help prevent  falls?  Wear shoes that:  Do not have high heels.  Have rubber bottoms.  Are comfortable and fit you well.  Are closed at the toe. Do not wear sandals.  If you use a stepladder:  Make sure that it is fully opened. Do not climb a closed stepladder.  Make sure that both sides of the stepladder are locked into place.  Ask someone to hold it for you, if possible.  Clearly mark and make sure that you can see:  Any grab bars or handrails.  First and last steps.  Where the edge of each step is.  Use tools that help you move around (mobility aids) if they are needed. These include:  Canes.  Walkers.  Scooters.  Crutches.  Turn on the lights when you go into a dark area. Replace any light bulbs as soon as they burn out.  Set up your furniture so you have a clear path. Avoid moving your furniture around.  If any of your floors are uneven, fix them.  If there are any pets around you, be aware of where they are.  Review your medicines with your doctor. Some medicines can make you feel dizzy. This can increase your chance of falling. Ask your doctor what other things that you can do to help prevent falls. This information is not intended to replace advice given to you by your health care provider. Make sure you discuss any questions you have with your health care provider. Document Released: 06/16/2009 Document Revised: 01/26/2016 Document Reviewed: 09/24/2014 Elsevier Interactive Patient Education  2017 [employer].

## 2018-05-19 NOTE — Assessment & Plan Note (Signed)
Rechecking levels today. Will adjust as needed. Call with any concerns.  

## 2018-05-19 NOTE — Progress Notes (Signed)
Subjective:   [name] is a 84 y.o. female who presents for Medicare Annual (Subsequent) preventive examination.  Review of Systems:   Cardiac Risk Factors include: hypertension;advanced age (>17men, >20 women);dyslipidemia     Objective:     Vitals: BP (!) 126/56 (BP Location: Left Arm, Patient Position: Sitting)   Pulse 69   Temp 98.7 F (37.1 C) (Temporal)   Resp 16   Ht 5' (1.524 m)   Wt 93 lb 1.6 oz (42.2 kg)   SpO2 93% Comment: on 2/l o2  BMI 18.18 kg/m   Body mass index is 18.18 kg/m.  Advanced Directives 05/19/2018  Does Patient Have a Medical Advance Directive? No  Would patient like information on creating a medical advance directive? Yes (MAU/Ambulatory/Procedural Areas - Information given)    Tobacco Social History   Tobacco Use  Smoking Status Former Smoker  . Years: 25.00  . Types: Cigarettes  . Last attempt to quit: 02/01/1986  . Years since quitting: 32.3  Smokeless Tobacco Never Used  Tobacco Comment   quit in 1987      Counseling given: Not Answered Comment: quit in 1987    Clinical Intake:  Pre-visit preparation completed: Yes  Pain : No/denies pain     Nutritional Status: BMI <19  Underweight Nutritional Risks: None Diabetes: No  How often do you need to have someone help you when you read instructions, pamphlets, or other written materials from your doctor or pharmacy?: 1 - Never What is the last grade level you completed in school?: high school   Interpreter Needed?: No  Information entered by :: Devery Odwyer,LPN   Past Medical History:  Diagnosis Date  . Anxiety   . Atrial fibrillation ([place])   . Clotting disorder ([facility])    left ankle  . COPD (chronic obstructive pulmonary disease) ([place])   . Depression   . Overactive bladder   . Thyroid disease    Past Surgical History:  Procedure Laterality Date  . ABDOMINAL HYSTERECTOMY    . BREAST LUMPECTOMY     Family History  Problem Relation Age of Onset  . Heart  disease Mother   . Heart failure Mother   . Parkinson's disease Father   . Heart disease Sister   . Heart disease Son    Social History   Socioeconomic History  . Marital status: Divorced    Spouse name: Not on file  . Number of children: Not on file  . Years of education: Not on file  . Highest education level: High school graduate  Occupational History  . Not on file  Social Needs  . Financial resource strain: Somewhat hard  . Food insecurity:    Worry: Never true    Inability: Never true  . Transportation needs:    Medical: No    Non-medical: No  Tobacco Use  . Smoking status: Former Smoker    Years: 25.00    Types: Cigarettes    Last attempt to quit: 02/01/1986    Years since quitting: 32.3  . Smokeless tobacco: Never Used  . Tobacco comment: quit in 1987   Substance and Sexual Activity  . Alcohol use: Never    Frequency: Never  . Drug use: Never  . Sexual activity: Not Currently  Lifestyle  . Physical activity:    Days per week: 0 days    Minutes per session: 0 min  . Stress: Not at all  Relationships  . Social connections:    Talks on phone: More  than three times a week    Gets together: More than three times a week    Attends religious service: Never    Active member of club or organization: No    Attends meetings of clubs or organizations: Never    Relationship status: Divorced  Other Topics Concern  . Not on file  Social History Narrative   Has 3 daughters, 2 local     Outpatient Encounter Medications as of 05/19/2018  Medication Sig  . amLODipine (NORVASC) 2.5 MG tablet Take 1 tablet (2.5 mg total) by mouth daily.  Marland Kitchen aspirin EC 81 MG tablet Take 1 tablet (81 mg total) by mouth daily.  . furosemide (LASIX) 20 MG tablet Take 1 tablet (20 mg total) by mouth daily as needed. For shortness of breath.  . levothyroxine (SYNTHROID, LEVOTHROID) 100 MCG tablet Take 1 tablet (100 mcg total) by mouth daily before breakfast.  . metoprolol tartrate (LOPRESSOR)  25 MG tablet Take 1 tablet (25 mg total) by mouth 2 (two) times daily.  . metoprolol tartrate (LOPRESSOR) 50 MG tablet Take 1 tablet (50 mg total) by mouth 2 (two) times daily.  . OXYGEN Inhale 2 L into the lungs. As needed during the day, and full use at bedtime  . potassium chloride (K-DUR) 10 MEQ tablet Take 1 tablet (10 mEq total) by mouth daily as needed. When you take furosemide as needed.  . solifenacin (VESICARE) 10 MG tablet Take 1 tablet (10 mg total) by mouth daily.  . Fluticasone-Salmeterol (ADVAIR DISKUS) 250-50 MCG/DOSE AEPB Inhale 1 puff into the lungs daily. Rinse mouth after use. (Patient not taking: Reported on 05/19/2018)   No facility-administered encounter medications on file as of 05/19/2018.     Activities of Daily Living In your present state of health, do you have any difficulty performing the following activities: 05/19/2018 02/07/2018  Hearing? 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 working on getting some haring aids- sees Dr.Barber  -  Vision? N Y  Difficulty concentrating or making decisions? Tempie Donning  Walking or climbing stairs? Y Y  Comment SOB  -  Dressing or bathing? N N  Doing errands, shopping? N N  Preparing Food and eating ? N -  Using the Toilet? N -  In the past six months, have you accidently leaked urine? Y -  Comment wears protection  -  Do you have problems with loss of bowel control? N -  Managing your Medications? N -  Managing your Finances? N -  Housekeeping or managing your Housekeeping? N -    Patient Care Team: Valerie Roys, DO as PCP - General (Family Medicine) Laverle Hobby, MD as Consulting Physician (Pulmonary Disease) Minna Merritts, MD as Consulting Physician (Cardiology)    Assessment:   This is a routine wellness examination for [facility].  Exercise Activities and Dietary recommendations Current Exercise Habits: The patient does not participate in regular exercise at present, Exercise limited by: None identified  Goals      . DIET - INCREASE WATER INTAKE     Recommend drinking at least 6-8 glasses of water a day        Fall Risk Fall Risk  05/19/2018 02/07/2018  Falls in the past year? No Yes  Number falls in past yr: - 2 or more  Injury with Fall? - Yes  Risk Factor Category  - High Fall Risk   Is the patient's home free of loose throw rugs in walkways, pet beds, electrical cords, etc?  yes      Grab bars in the bathroom? yes      Handrails on the stairs?   no stairs       Adequate lighting?   yes  Timed Get Up and Go performed: Completed in 8 seconds with no use of assistive devices, steady gait. No intervention needed at this time.   Depression Screen PHQ 2/9 Scores 05/19/2018  PHQ - 2 Score 0     Cognitive Function     6CIT Screen 05/19/2018  What Year? 0 points  What month? 0 points  What time? 0 points  Count back from 20 0 points  Months in reverse 0 points  Repeat phrase 0 points  Total Score 0    Immunization History  Administered Date(s) Administered  . Influenza Split 06/15/2010, 05/24/2011, 06/20/2012, 07/01/2013, 05/27/2014  . Influenza, High Dose Seasonal PF 05/18/2016, 06/19/2017, 05/16/2018  . Influenza, Seasonal, Injecte, Preservative Fre 06/20/2012  . Influenza,inj,Quad PF,6+ Mos 05/10/2015  . Influenza,inj,quad, With Preservative 06/15/2010, 05/24/2011, 07/01/2013, 05/27/2014  . Pneumococcal Conjugate-13 05/27/2014  . Pneumococcal Polysaccharide-23 05/24/2011, 06/13/2015  . Td 02/07/2018  . Tdap 02/10/2016    Qualifies for Shingles Vaccine? Yes, discussed shingrix vaccine   Screening Tests Health Maintenance  Topic Date Due  . DEXA SCAN  06/26/1995  . PNA vac Low Risk Adult (1 of 2 - PCV13) 06/26/1995  . INFLUENZA VACCINE  04/03/2018  . TETANUS/TDAP  02/08/2028    Cancer Screenings: Lung: Low Dose CT Chest recommended if Age 85-80 years, 30 pack-year currently smoking OR have quit w/in 15years. Patient does not qualify. Breast:  Up to date on Mammogram?  Yes  No longer required Up to date of Bone Density/Dexa? No declined Colorectal: no longer required  Additional Screenings:  Hepatitis C Screening: not indicated      Plan:    I have personally reviewed and addressed the Medicare Annual Wellness questionnaire and have noted the following in the patient's chart:  A. Medical and social history B. Use of alcohol, tobacco or illicit drugs  C. Current medications and supplements D. Functional ability and status E.  Nutritional status F.  Physical activity G. Advance directives H. List of other physicians I.  Hospitalizations, surgeries, and ER visits in previous 12 months J.  Jal such as hearing and vision if needed, cognitive and depression L. Referrals and appointments   In addition, I have reviewed and discussed with patient certain preventive protocols, quality metrics, and best practice recommendations. A written personalized care plan for preventive services as well as general preventive health recommendations were provided to patient.   Signed,  Tyler Aas, LPN Nurse Health Advisor   Nurse Notes:none

## 2018-05-19 NOTE — Assessment & Plan Note (Signed)
Not peeing very often. Will try off vesicare as not covered and see how she does. If having issues, will try something else. Rx for depends sent to her pharmacy.

## 2018-05-20 ENCOUNTER — Other Ambulatory Visit: Payer: Self-pay | Admitting: Family Medicine

## 2018-05-20 ENCOUNTER — Encounter: Payer: Self-pay | Admitting: Family Medicine

## 2018-05-20 DIAGNOSIS — Z7689 Persons encountering health services in other specified circumstances: Secondary | ICD-10-CM | POA: Diagnosis not present

## 2018-05-20 LAB — COMPREHENSIVE METABOLIC PANEL
ALT: 10 IU/L (ref 0–32)
AST: 22 IU/L (ref 0–40)
Albumin/Globulin Ratio: 1.8 (ref 1.2–2.2)
Albumin: 4.1 g/dL (ref 3.5–4.7)
Alkaline Phosphatase: 119 IU/L — ABNORMAL HIGH (ref 39–117)
BUN/Creatinine Ratio: 16 (ref 12–28)
BUN: 17 mg/dL (ref 8–27)
Bilirubin Total: 0.3 mg/dL (ref 0.0–1.2)
CO2: 24 mmol/L (ref 20–29)
Calcium: 9 mg/dL (ref 8.7–10.3)
Chloride: 99 mmol/L (ref 96–106)
Creatinine, Ser: 1.09 mg/dL — ABNORMAL HIGH (ref 0.57–1.00)
GFR calc Af Amer: 53 mL/min/{1.73_m2} — ABNORMAL LOW (ref >59)
GFR calc non Af Amer: 46 mL/min/{1.73_m2} — ABNORMAL LOW (ref >59)
Globulin, Total: 2.3 g/dL (ref 1.5–4.5)
Glucose: 91 mg/dL (ref 65–99)
Potassium: 4.4 mmol/L (ref 3.5–5.2)
Sodium: 137 mmol/L (ref 134–144)
Total Protein: 6.4 g/dL (ref 6.0–8.5)

## 2018-05-20 LAB — CBC WITH DIFFERENTIAL/PLATELET
Basophils Absolute: 0.1 10*3/uL (ref 0.0–0.2)
Basos: 1 %
EOS (ABSOLUTE): 0.2 10*3/uL (ref 0.0–0.4)
Eos: 3 %
Hematocrit: 36.1 % (ref 34.0–46.6)
Hemoglobin: 11.5 g/dL (ref 11.1–15.9)
Immature Grans (Abs): 0 10*3/uL (ref 0.0–0.1)
Immature Granulocytes: 0 %
Lymphocytes Absolute: 2.5 10*3/uL (ref 0.7–3.1)
Lymphs: 30 %
MCH: 27.4 pg (ref 26.6–33.0)
MCHC: 31.9 g/dL (ref 31.5–35.7)
MCV: 86 fL (ref 79–97)
Monocytes Absolute: 0.9 10*3/uL (ref 0.1–0.9)
Monocytes: 11 %
Neutrophils Absolute: 4.5 10*3/uL (ref 1.4–7.0)
Neutrophils: 55 %
Platelets: 249 10*3/uL (ref 150–450)
RBC: 4.2 x10E6/uL (ref 3.77–5.28)
RDW: 12.6 % (ref 12.3–15.4)
WBC: 8.3 10*3/uL (ref 3.4–10.8)

## 2018-05-20 LAB — UA/M W/RFLX CULTURE, ROUTINE
Bilirubin, UA: NEGATIVE
Glucose, UA: NEGATIVE
Ketones, UA: NEGATIVE
Leukocytes, UA: NEGATIVE
Nitrite, UA: NEGATIVE
Protein, UA: NEGATIVE
RBC, UA: NEGATIVE
Specific Gravity, UA: 1.01 (ref 1.005–1.030)
Urobilinogen, Ur: 0.2 mg/dL (ref 0.2–1.0)
pH, UA: 6.5 (ref 5.0–7.5)

## 2018-05-20 LAB — MICROALBUMIN, URINE WAIVED
Creatinine, Urine Waived: 50 mg/dL (ref 10–300)
Microalb, Ur Waived: 10 mg/L (ref 0–19)
Microalb/Creat Ratio: <30 mg/g (ref <30)

## 2018-05-20 LAB — TSH: TSH: 1.04 u[IU]/mL (ref 0.450–4.500)

## 2018-05-20 LAB — LIPID PANEL W/O CHOL/HDL RATIO
Cholesterol, Total: 148 mg/dL (ref 100–199)
HDL: 53 mg/dL (ref >39)
LDL Calculated: 65 mg/dL (ref 0–99)
Triglycerides: 151 mg/dL — ABNORMAL HIGH (ref 0–149)
VLDL Cholesterol Cal: 30 mg/dL (ref 5–40)

## 2018-05-21 ENCOUNTER — Encounter: Payer: Medicare Other | Admitting: Internal Medicine

## 2018-05-21 DIAGNOSIS — S41111D Laceration without foreign body of right upper arm, subsequent encounter: Secondary | ICD-10-CM | POA: Diagnosis not present

## 2018-05-21 DIAGNOSIS — S51811A Laceration without foreign body of right forearm, initial encounter: Secondary | ICD-10-CM | POA: Diagnosis not present

## 2018-05-21 DIAGNOSIS — J449 Chronic obstructive pulmonary disease, unspecified: Secondary | ICD-10-CM | POA: Diagnosis not present

## 2018-05-21 DIAGNOSIS — I10 Essential (primary) hypertension: Secondary | ICD-10-CM | POA: Diagnosis not present

## 2018-05-24 DIAGNOSIS — S41112A Laceration without foreign body of left upper arm, initial encounter: Secondary | ICD-10-CM | POA: Diagnosis not present

## 2018-05-24 NOTE — Progress Notes (Signed)
KINDA, POTTLE ([id number]) Visit Report for 05/21/2018 HPI Details Patient Name: [name], [name] Date of Service: 05/21/2018 3:00 PM Medical Record Number: [id number] Patient Account Number: [id number] Date of Birth/Sex: 13-Jan-1928 (84 y.o. F) Treating RN: [name] Barman Primary Care Provider: Park Liter Other Clinician: Referring Provider: Park Liter Treating Provider/Extender: Tito Dine in Treatment: 14 History of Present Illness HPI Description: 02/12/18 ADMISSION This is an 84 year old woman who is recently moved to [place] from [place]. Her story began in February where she fell on some ice suffering extensive lacerations to her bilateral lower extremities. She is able to show me extensive pictures of the right lower leg but with going to a wound care center and [place] 3 times a week these eventually closed over. She has been left with 1 area on the upper lateral left calf. She has been applying Neosporin to this and applying a bandage. Currently this measures 2 x 1.5 cm. The patient is not a diabetic. She is an ex-smoker quitting 40 years ago. She does have COPD by description. ABIs in our clinic were 1.32 on the right and 1.03 on the left 02/19/18; right lower leg wound which was initially trauma. The area that we look that last week is smaller. Still covered in a nonviable surface however. We are using Iodoflex 02/26/18; right lower leg wound which was initially trauma in the setting of chronic venous insufficiency. Surface of the wound looks much better healthy granulation advancing epithelialization. We have good edema control 03/05/18; right lower leg wound which was initially trauma in the setting of chronic venous insufficiency. She continues to make nice progress here. We have good edema control oHe arrives today with a new traumatic laceration on her right dorsal forearm which she states happened while she was putting on a sweatshirt 03/12/18; right  lower leg wound as closed however it still looks vulnerable. She has chronic venous insufficiency. oThe new wound from last week a traumatic area on her right dorsal or arm unfortunately does not have a viable surface. I remove some nonviable skin and necrotic subcutaneous debris from the wound surface. Hemostasis with silver nitrate and direct pressure 03/19/18; right lower leg wound is closed. She has chronic venous insufficiency and will need ongoing compression oThe new wounds from 2 weeks ago on the right upper elbow is closed. The area on her right dorsal forearm continues to have a nonviable surface requiring debridement 03/26/18; we'll need to look into ongoing compression for her legs. 2 wounds on the right upper elbow is closed. The area on the right dorsal forearm looks better. Hydrofera Blue secondary to hypertrophic granulation 04/02/18; he has compression stockings although she did not wear them today. Severe chronic venous insufficiency. She has 2 wounds on the right upper elbow which I said were closed last week which actually are although they're very small and she has a smaller clean wound on the right dorsal forearm. We've been using Hydrofera Blue secondary to some upper granulation. All of this looks better 04/09/18-She is seen in follow up evaluation for a right elbow and right forearm skin tear. The right elbow is healed. We will continue with same treatment plan and she'll follow next week 04/16/18-She is seen in follow-up evaluation for right forearm skin tear, this is essentially healed. We will cover with foam border and she will follow-up next week 04/23/18; the patient arrives with a new skin tear on her right dorsal arm just below the elbow. She got this while carrying a  box. She has a linear skin tear. There is no depth I don't think this should've been sutured. There is a skin flap medially I'm not sure if this will be viable or not 04/30/18; the new skin tear from last  week unfortunately has remained viable in terms of the skin flap. She has a small open area that looks healthy. Using moistened silver collagen 05/07/18; right dorsal arm skin tear. Nonviable tissue over the remnants of the wound. We've been using silver collagen 05/21/18; right dorsal arm skin tear. Nonviable tissue over a small remnant of the wound was washed off. We've been using silver collagen which we will continue [name], [name] ([id number]) Electronic Signature(s) Signed: 05/21/2018 4:51:54 PM By: Linton Ham MD Entered By: Linton Ham on 05/21/2018 16:48:23 [name], [name] ([id number]) -------------------------------------------------------------------------------- Physical Exam Details Patient Name: [name] Date of Service: 05/21/2018 3:00 PM Medical Record Number: [id number] Patient Account Number: [id number] Date of Birth/Sex: 14-Jan-1928 (84 y.o. F) Treating RN: [name] Barman Primary Care Provider: Park Liter Other Clinician: Referring Provider: Park Liter Treating Provider/Extender: Tito Dine in Treatment: 2 Constitutional Patient is hypertensive.. Pulse regular and within target range for patient.Marland Kitchen Respirations regular, non-labored and within target range.. Temperature is normal and within the target range for the patient.Marland Kitchen appears in no distress. Notes when exam; the new area on the dorsal forearm from 2 or 3 weeks ago still has a small area of necrotic debris over the wound. I removed this with moistened 4 x 4's. There is still a small healthy-looking wound here that is not closed Electronic Signature(s) Signed: 05/21/2018 4:51:54 PM By: Linton Ham MD Entered By: Linton Ham on 05/21/2018 16:49:18 [name] ([id number]) -------------------------------------------------------------------------------- Physician Orders Details Patient Name: [name] Date of Service: 05/21/2018 3:00 PM Medical Record Number:  [id number] Patient Account Number: [id number] Date of Birth/Sex: 05/07/1928 (84 y.o. F) Treating RN: [name] Barman Primary Care Provider: Park Liter Other Clinician: Referring Provider: Park Liter Treating Provider/Extender: Tito Dine in Treatment: 74 Verbal / Phone Orders: No Diagnosis Coding Wound Cleansing Wound #4 Right,Proximal Forearm o Cleanse wound with mild soap and water o May Shower, gently pat wound dry prior to applying new dressing. Anesthetic (add to Medication List) Wound #4 Right,Proximal Forearm o Topical Lidocaine 4% cream applied to wound bed prior to debridement (In Clinic Only). Primary Wound Dressing Wound #4 Right,Proximal Forearm o Silver Collagen Secondary Dressing Wound #4 Right,Proximal Forearm o Dry Gauze o Conform/Kerlix Dressing Change Frequency Wound #4 Right,Proximal Forearm o Other: - twice weekly Follow-up Appointments Wound #4 Right,Proximal Forearm o Return Appointment in 2 weeks. Electronic Signature(s) Signed: 05/21/2018 4:51:54 PM By: Linton Ham MD Signed: 05/22/2018 5:07:26 PM By: Gretta Cool BSN, RN, CWS, Kim RN, BSN Entered By: Gretta Cool, BSN, RN, CWS, Kim on 05/21/2018 15:16:25 [name], [name] ([id number]) -------------------------------------------------------------------------------- Problem List Details Patient Name: [name] Date of Service: 05/21/2018 3:00 PM Medical Record Number: [id number] Patient Account Number: [id number] Date of Birth/Sex: 05/31/1928 (84 y.o. F) Treating RN: [name] Barman Primary Care Provider: Park Liter Other Clinician: Referring Provider: Park Liter Treating Provider/Extender: Tito Dine in Treatment: 14 Active Problems ICD-10 Evaluated Encounter Code Description Active Date Today Diagnosis S41.111D Laceration without foreign body of right upper arm, 03/05/2018 No Yes subsequent encounter Inactive Problems Resolved Problems ICD-10 Code  Description Active Date Resolved Date L97.222 Non-pressure chronic ulcer of left calf with fat layer exposed 02/12/2018 02/12/2018 I87.311 Chronic venous hypertension (idiopathic) with ulcer of right lower 02/12/2018 02/12/2018 extremity S81.812D Laceration without foreign  body, left lower leg, subsequent 02/12/2018 02/12/2018 encounter Electronic Signature(s) Signed: 05/21/2018 4:51:54 PM By: Linton Ham MD Entered By: Linton Ham on 05/21/2018 16:47:41 [place], [place] ([id number]) -------------------------------------------------------------------------------- Progress Note Details Patient Name: [name] Date of Service: 05/21/2018 3:00 PM Medical Record Number: [id number] Patient Account Number: [id number] Date of Birth/Sex: [birth year] (84 y.o. F) Treating RN: [name] Barman Primary Care Provider: Park Liter Other Clinician: Referring Provider: Park Liter Treating Provider/Extender: Tito Dine in Treatment: 14 Subjective History of Present Illness (HPI) 02/12/18 ADMISSION This is an 84 year old woman who is recently moved to [place] from [place]. Her story began in February where she fell on some ice suffering extensive lacerations to her bilateral lower extremities. She is able to show me extensive pictures of the right lower leg but with going to a wound care center and [place] 3 times a week these eventually closed over. She has been left with 1 area on the upper lateral left calf. She has been applying Neosporin to this and applying a bandage. Currently this measures 2 x 1.5 cm. The patient is not a diabetic. She is an ex-smoker quitting 40 years ago. She does have COPD by description. ABIs in our clinic were 1.32 on the right and 1.03 on the left 02/19/18; right lower leg wound which was initially trauma. The area that we look that last week is smaller. Still covered in a nonviable surface however. We are using Iodoflex 02/26/18; right lower leg  wound which was initially trauma in the setting of chronic venous insufficiency. Surface of the wound looks much better healthy granulation advancing epithelialization. We have good edema control 03/05/18; right lower leg wound which was initially trauma in the setting of chronic venous insufficiency. She continues to make nice progress here. We have good edema control He arrives today with a new traumatic laceration on her right dorsal forearm which she states happened while she was putting on a sweatshirt 03/12/18; right lower leg wound as closed however it still looks vulnerable. She has chronic venous insufficiency. The new wound from last week a traumatic area on her right dorsal or arm unfortunately does not have a viable surface. I remove some nonviable skin and necrotic subcutaneous debris from the wound surface. Hemostasis with silver nitrate and direct pressure 03/19/18; right lower leg wound is closed. She has chronic venous insufficiency and will need ongoing compression The new wounds from 2 weeks ago on the right upper elbow is closed. The area on her right dorsal forearm continues to have a nonviable surface requiring debridement 03/26/18; we'll need to look into ongoing compression for her legs. 2 wounds on the right upper elbow is closed. The area on the right dorsal forearm looks better. Hydrofera Blue secondary to hypertrophic granulation 04/02/18; he has compression stockings although she did not wear them today. Severe chronic venous insufficiency. She has 2 wounds on the right upper elbow which I said were closed last week which actually are although they're very small and she has a smaller clean wound on the right dorsal forearm. We've been using Hydrofera Blue secondary to some upper granulation. All of this looks better 04/09/18-She is seen in follow up evaluation for a right elbow and right forearm skin tear. The right elbow is healed. We will continue with same treatment plan  and she'll follow next week 04/16/18-She is seen in follow-up evaluation for right forearm skin tear, this is essentially healed. We will cover with foam border and she will follow-up next week  04/23/18; the patient arrives with a new skin tear on her right dorsal arm just below the elbow. She got this while carrying a box. She has a linear skin tear. There is no depth I don't think this should've been sutured. There is a skin flap medially I'm not sure if this will be viable or not 04/30/18; the new skin tear from last week unfortunately has remained viable in terms of the skin flap. She has a small open area that looks healthy. Using moistened silver collagen 05/07/18; right dorsal arm skin tear. Nonviable tissue over the remnants of the wound. We've been using silver collagen 05/21/18; right dorsal arm skin tear. Nonviable tissue over a small remnant of the wound was washed off. We've been using silver collagen which we will continue [name], [name] ([id number]) Objective Constitutional Patient is hypertensive.. Pulse regular and within target range for patient.Marland Kitchen Respirations regular, non-labored and within target range.. Temperature is normal and within the target range for the patient.Marland Kitchen appears in no distress. Vitals Time Taken: 3:02 PM, Height: 60 in, Weight: 94.2 lbs, BMI: 18.4, Temperature: 97.8 F, Pulse: 76 bpm, Respiratory Rate: 16 breaths/min, Blood Pressure: 156/70 mmHg. General Notes: when exam; the new area on the dorsal forearm from 2 or 3 weeks ago still has a small area of necrotic debris over the wound. I removed this with moistened 4 x 4's. There is still a small healthy-looking wound here that is not closed Integumentary (Hair, Skin) Wound #4 status is Open. Original cause of wound was Trauma. The wound is located on the Right,Proximal Forearm. The wound measures 1cm length x 0.4cm width x 0.1cm depth; 0.314cm^2 area and 0.031cm^3 volume. There is no tunneling or undermining  noted. There is a none present amount of drainage noted. The wound margin is distinct with the outline attached to the wound base. There is large (67-100%) red granulation within the wound bed. There is no necrotic tissue within the wound bed. The periwound skin appearance had no abnormalities noted for texture. The periwound skin appearance had no abnormalities noted for color. The periwound skin appearance did not exhibit: Dry/Scaly, Maceration. Periwound temperature was noted as No Abnormality. The periwound has tenderness on palpation. Assessment Active Problems ICD-10 Laceration without foreign body of right upper arm, subsequent encounter Plan Wound Cleansing: Wound #4 Right,Proximal Forearm: Cleanse wound with mild soap and water May Shower, gently pat wound dry prior to applying new dressing. Anesthetic (add to Medication List): Wound #4 Right,Proximal Forearm: Topical Lidocaine 4% cream applied to wound bed prior to debridement (In Clinic Only). Primary Wound Dressing: Wound #4 Right,Proximal Forearm: Silver Collagen Secondary Dressing: Wound #4 Right,Proximal Forearm: [name], [name] ([id number]) Dry Gauze Conform/Kerlix Dressing Change Frequency: Wound #4 Right,Proximal Forearm: Other: - twice weekly Follow-up Appointments: Wound #4 Right,Proximal Forearm: Return Appointment in 2 weeks. 1 I'm continuing with the collagen-based dressings, kerlix and conform. Wound is better disappointing that is not closed however Electronic Signature(s) Signed: 05/21/2018 4:51:54 PM By: Linton Ham MD Entered By: Linton Ham on 05/21/2018 16:49:56 [name], [name] ([id number]) -------------------------------------------------------------------------------- SuperBill Details Patient Name: [name] Date of Service: 05/21/2018 Medical Record Number: [id number] Patient Account Number: [id number] Date of Birth/Sex: [birth year] (84 y.o. F) Treating RN: [name] Barman Primary  Care Provider: Park Liter Other Clinician: Referring Provider: Park Liter Treating Provider/Extender: Tito Dine in Treatment: 14 Diagnosis Coding ICD-10 Codes Code Description S41.111D Laceration without foreign body of right upper arm, subsequent encounter Facility Procedures CPT4 Code: [id number] Description: [place] VISIT-LEV  3 EST PT Modifier: Quantity: 1 Physician Procedures CPT4 Code: [id number] Description: 02548 - WC PHYS LEVEL 2 - EST PT ICD-10 Diagnosis Description S41.111D Laceration without foreign body of right upper arm, subsequ Modifier: ent encounter Quantity: 1 Electronic Signature(s) Signed: 05/21/2018 4:51:54 PM By: Linton Ham MD Entered By: Linton Ham on 05/21/2018 16:50:19

## 2018-05-24 NOTE — Progress Notes (Signed)
SMANTHA, BOAKYE ([id number]) Visit Report for 05/21/2018 Arrival Information Details Patient Name: [name], [name] Date of Service: 05/21/2018 3:00 PM Medical Record Number: [id number] Patient Account Number: [id number] Date of Birth/Sex: 20-Jan-1928 (84 y.o. F) Treating RN: Secundino Ginger Primary Care Jontez Redfield: Park Liter Other Clinician: Referring Devaeh Amadi: Park Liter Treating Tommi Crepeau/Extender: Tito Dine in Treatment: 14 Visit Information History Since Last Visit Added or deleted any medications: No Patient Arrived: Ambulatory Any new allergies or adverse reactions: No Arrival Time: 14:59 Had a fall or experienced change in No Accompanied By: daughter activities of daily living that may affect Transfer Assistance: None risk of falls: Patient Identification Verified: Yes Signs or symptoms of abuse/neglect since last visito No Secondary Verification Process Completed: Yes Hospitalized since last visit: No Patient Requires Transmission-Based No Implantable device outside of the clinic excluding No Precautions: cellular tissue based products placed in the center Patient Has Alerts: No since last visit: Has Dressing in Place as Prescribed: No Pain Present Now: Yes Electronic Signature(s) Signed: 05/21/2018 3:42:50 PM By: Secundino Ginger Entered By: Secundino Ginger on 05/21/2018 15:02:12 [name] ([id number]) -------------------------------------------------------------------------------- Clinic Level of Care Assessment Details Patient Name: [name] Date of Service: 05/21/2018 3:00 PM Medical Record Number: [id number] Patient Account Number: [id number] Date of Birth/Sex: [birth year] (84 y.o. F) Treating RN: [name] Barman Primary Care Jenice Leiner: Park Liter Other Clinician: Referring Mireya Meditz: Park Liter Treating Elvie Maines/Extender: Tito Dine in Treatment: 14 Clinic Level of Care Assessment Items TOOL 4 Quantity Score []  - Use when  only an EandM is performed on FOLLOW-UP visit 0 ASSESSMENTS - Nursing Assessment / Reassessment []  - Reassessment of Co-morbidities (includes updates in patient status) 0 X- 1 5 Reassessment of Adherence to Treatment Plan ASSESSMENTS - Wound and Skin Assessment / Reassessment X - Simple Wound Assessment / Reassessment - one wound 1 5 []  - 0 Complex Wound Assessment / Reassessment - multiple wounds []  - 0 Dermatologic / Skin Assessment (not related to wound area) ASSESSMENTS - Focused Assessment []  - Circumferential Edema Measurements - multi extremities 0 []  - 0 Nutritional Assessment / Counseling / Intervention []  - 0 Lower Extremity Assessment (monofilament, tuning fork, pulses) []  - 0 Peripheral Arterial Disease Assessment (using hand held doppler) ASSESSMENTS - Ostomy and/or Continence Assessment and Care []  - Incontinence Assessment and Management 0 []  - 0 Ostomy Care Assessment and Management (repouching, etc.) PROCESS - Coordination of Care X - Simple Patient / Family Education for ongoing care 1 15 []  - 0 Complex (extensive) Patient / Family Education for ongoing care X- 1 10 Staff obtains Programmer, systems, Records, Test Results / Process Orders []  - 0 Staff telephones HHA, Nursing Homes / Clarify orders / etc []  - 0 Routine Transfer to another Facility (non-emergent condition) []  - 0 Routine Hospital Admission (non-emergent condition) []  - 0 New Admissions / Biomedical engineer / Ordering NPWT, Apligraf, etc. []  - 0 Emergency Hospital Admission (emergent condition) X- 1 10 Simple Discharge Coordination Flanery, Mekenna ([id number]) []  - 0 Complex (extensive) Discharge Coordination PROCESS - Special Needs []  - Pediatric / Minor Patient Management 0 []  - 0 Isolation Patient Management []  - 0 Hearing / Language / Visual special needs []  - 0 Assessment of Community assistance (transportation, D/C planning, etc.) []  - 0 Additional assistance / Altered  mentation []  - 0 Support Surface(s) Assessment (bed, cushion, seat, etc.) INTERVENTIONS - Wound Cleansing / Measurement X - Simple Wound Cleansing - one wound 1 5 []  - 0 Complex Wound Cleansing - multiple wounds X- 1  5 Wound Imaging (photographs - any number of wounds) []  - 0 Wound Tracing (instead of photographs) X- 1 5 Simple Wound Measurement - one wound []  - 0 Complex Wound Measurement - multiple wounds INTERVENTIONS - Wound Dressings []  - Small Wound Dressing one or multiple wounds 0 X- 1 15 Medium Wound Dressing one or multiple wounds []  - 0 Large Wound Dressing one or multiple wounds []  - 0 Application of Medications - topical []  - 0 Application of Medications - injection INTERVENTIONS - Miscellaneous []  - External ear exam 0 []  - 0 Specimen Collection (cultures, biopsies, blood, body fluids, etc.) []  - 0 Specimen(s) / Culture(s) sent or taken to Lab for analysis []  - 0 Patient Transfer (multiple staff / Civil Service fast streamer / Similar devices) []  - 0 Simple Staple / Suture removal (25 or less) []  - 0 Complex Staple / Suture removal (26 or more) []  - 0 Hypo / Hyperglycemic Management (close monitor of Blood Glucose) []  - 0 Ankle / Brachial Index (ABI) - do not check if billed separately X- 1 5 Vital Signs Livecchi, Marisel ([id number]) Has the patient been seen at the hospital within the last three years: Yes Total Score: 80 Level Of Care: New/Established - Level 3 Electronic Signature(s) Signed: 05/22/2018 5:07:26 PM By: Gretta Cool, BSN, RN, CWS, Kim RN, BSN Entered By: Gretta Cool, BSN, RN, CWS, Kim on 05/21/2018 15:16:49 [name] ([id number]) -------------------------------------------------------------------------------- Encounter Discharge Information Details Patient Name: [name] Date of Service: 05/21/2018 3:00 PM Medical Record Number: [id number] Patient Account Number: [id number] Date of Birth/Sex: 09-20-1927 (84 y.o. F) Treating RN: Montey Hora Primary Care Twan Harkin: Park Liter Other Clinician: Referring Saga Balthazar: Park Liter Treating Ishmail Mcmanamon/Extender: Tito Dine in Treatment: 14 Encounter Discharge Information Items Discharge Condition: Stable Ambulatory Status: Ambulatory Discharge Destination: Home Transportation: Private Auto Accompanied By: friend Schedule Follow-up Appointment: Yes Clinical Summary of Care: Electronic Signature(s) Signed: 05/21/2018 3:33:17 PM By: Montey Hora Entered By: Montey Hora on 05/21/2018 15:33:17 [name] ([id number]) -------------------------------------------------------------------------------- Lower Extremity Assessment Details Patient Name: [name] Date of Service: 05/21/2018 3:00 PM Medical Record Number: [id number] Patient Account Number: [id number] Date of Birth/Sex: [birth year] (84 y.o. F) Treating RN: Secundino Ginger Primary Care Kamariya Blevens: Park Liter Other Clinician: Referring Irys Nigh: Park Liter Treating Taimane Stimmel/Extender: Ricard Dillon Weeks in Treatment: 14 Electronic Signature(s) Signed: 05/21/2018 3:42:50 PM By: Secundino Ginger Entered By: Secundino Ginger on 05/21/2018 15:05:11 Sheridan, Joycelyn Schmid ([id number]) -------------------------------------------------------------------------------- Multi Wound Chart Details Patient Name: [name] Date of Service: 05/21/2018 3:00 PM Medical Record Number: [id number] Patient Account Number: [id number] Date of Birth/Sex: [birth year] (84 y.o. F) Treating RN: [name] Barman Primary Care Zyaira Vejar: Park Liter Other Clinician: Referring Davidlee Jeanbaptiste: Park Liter Treating Maelyn Berrey/Extender: Tito Dine in Treatment: 14 Vital Signs Height(in): 60 Pulse(bpm): 66 Weight(lbs): 94.2 Blood Pressure(mmHg): 156/70 Body Mass Index(BMI): 18 Temperature(F): 97.8 Respiratory Rate 16 (breaths/min): Photos: [N/A:N/A] Wound Location: Right Forearm - Proximal N/A N/A Wounding  Event: Trauma N/A N/A Primary Etiology: Trauma, Other N/A N/A Comorbid History: Cataracts, Glaucoma, Asthma, N/A N/A Chronic Obstructive Pulmonary Disease (COPD), Arrhythmia, Hypertension Date Acquired: 04/21/2018 N/A N/A Weeks of Treatment: 4 N/A N/A Wound Status: Open N/A N/A Measurements L x W x D 1x0.4x0.1 N/A N/A (cm) Area (cm) : 0.314 N/A N/A Volume (cm) : 0.031 N/A N/A % Reduction in Area: 33.30% N/A N/A % Reduction in Volume: 34.00% N/A N/A Classification: Full Thickness Without N/A N/A Exposed Support Structures Exudate Amount: None Present N/A N/A Wound Margin: Distinct, outline attached N/A N/A Granulation Amount:  Large (67-100%) N/A N/A Granulation Quality: Red N/A N/A Necrotic Amount: None Present (0%) N/A N/A Exposed Structures: Fascia: No N/A N/A Fat Layer (Subcutaneous Tissue) Exposed: No Tendon: No Muscle: No Brion, Narely ([id number]) Joint: No Bone: No Epithelialization: None N/A N/A Periwound Skin Texture: Excoriation: No N/A N/A Induration: No Callus: No Crepitus: No Rash: No Scarring: No Periwound Skin Moisture: Maceration: No N/A N/A Dry/Scaly: No Periwound Skin Color: Erythema: Yes N/A N/A Atrophie Blanche: No Cyanosis: No Ecchymosis: No Hemosiderin Staining: No Mottled: No Pallor: No Rubor: No Temperature: No Abnormality N/A N/A Tenderness on Palpation: Yes N/A N/A Wound Preparation: Ulcer Cleansing: N/A N/A Rinsed/Irrigated with Saline Topical Anesthetic Applied: Other: lidocaine 4% Treatment Notes Wound #4 (Right, Proximal Forearm) 1. Cleansed with: Clean wound with Normal Saline 2. Anesthetic Topical Lidocaine 4% cream to wound bed prior to debridement 4. Dressing Applied: Prisma Ag 5. Secondary Dressing Applied Dry Gauze Kerlix/Conform 7. Secured with Tape Notes netting Electronic Signature(s) Signed: 05/21/2018 4:51:54 PM By: Linton Ham MD Entered By: Linton Ham on 05/21/2018 16:47:50 [name] ([id number]) -------------------------------------------------------------------------------- Multi-Disciplinary Care Plan Details Patient Name: [name] Date of Service: 05/21/2018 3:00 PM Medical Record Number: [id number] Patient Account Number: [id number] Date of Birth/Sex: April 03, 1928 (84 y.o. F) Treating RN: [name] Barman Primary Care Lissandro Dilorenzo: Park Liter Other Clinician: Referring Ranetta Armacost: Park Liter Treating Annie Roseboom/Extender: Tito Dine in Treatment: 14 Active Inactive ` Necrotic Tissue Nursing Diagnoses: Impaired tissue integrity related to necrotic/devitalized tissue Goals: Necrotic/devitalized tissue will be minimized in the wound bed Date Initiated: 02/12/2018 Target Resolution Date: 03/07/2018 Goal Status: Active Interventions: Provide education on necrotic tissue and debridement process Treatment Activities: Apply topical anesthetic as ordered : 02/12/2018 Notes: ` Orientation to the Wound Care Program Nursing Diagnoses: Knowledge deficit related to the wound healing center program Goals: Patient/caregiver will verbalize understanding of the [place] Date Initiated: 02/12/2018 Target Resolution Date: 03/07/2018 Goal Status: Active Interventions: Provide education on orientation to the wound center Notes: ` Pain, Acute or Chronic Nursing Diagnoses: Potential alteration in comfort, pain Goals: Patient will verbalize adequate pain control and receive pain control interventions during procedures as needed Date Initiated: 02/12/2018 Target Resolution Date: 03/07/2018 TINSLEE, KLARE ([id number]) Goal Status: Active Interventions: Reposition patient for comfort Notes: ` Wound/Skin Impairment Nursing Diagnoses: Impaired tissue integrity Goals: Ulcer/skin breakdown will have a volume reduction of 80% by week 12 Date Initiated: 02/12/2018 Target Resolution Date: 05/15/2018 Goal Status:  Active Interventions: Assess ulceration(s) every visit Treatment Activities: Patient referred to home care : 02/12/2018 Notes: Electronic Signature(s) Signed: 05/22/2018 5:07:26 PM By: Gretta Cool, BSN, RN, CWS, Kim RN, BSN Entered By: Gretta Cool, BSN, RN, CWS, Kim on 05/21/2018 15:15:55 [name] ([id number]) -------------------------------------------------------------------------------- Pain Assessment Details Patient Name: [name] Date of Service: 05/21/2018 3:00 PM Medical Record Number: [id number] Patient Account Number: [id number] Date of Birth/Sex: 07-22-1928 (84 y.o. F) Treating RN: Secundino Ginger Primary Care Caili Escalera: Park Liter Other Clinician: Referring Ziana Heyliger: Park Liter Treating Tanecia Mccay/Extender: Tito Dine in Treatment: 14 Active Problems Location of Pain Severity and Description of Pain Patient Has Paino Yes Site Locations Duration of the Pain. Constant / Intermittento Intermittent How Long Does it Lasto Hours: Minutes: 1 Rate the pain. Current Pain Level: 5 Character of Pain Describe the Pain: Sharp Pain Management and Medication Current Pain Management: Goals for Pain Management pt C/O intermittent pain 5/10 to wound area that doesn't last. enc to see primary Bret Vanessen for pain management PRN Electronic Signature(s) Signed: 05/21/2018 3:42:50 PM By: Secundino Ginger  Entered By: Secundino Ginger on 05/21/2018 15:04:38 [name] ([id number]) -------------------------------------------------------------------------------- Patient/Caregiver Education Details Patient Name: [name] Date of Service: 05/21/2018 3:00 PM Medical Record Number: [id number] Patient Account Number: [id number] Date of Birth/Gender: [birth year] (84 y.o. F) Treating RN: Montey Hora Primary Care Physician: Park Liter Other Clinician: Referring Physician: Park Liter Treating Physician/Extender: Tito Dine in Treatment: 14 Education  Assessment Education Provided To: Patient Education Topics Provided Wound/Skin Impairment: Handouts: Other: wound care as ordered Methods: Demonstration, Explain/Verbal Responses: State content correctly Electronic Signature(s) Signed: 05/21/2018 4:04:54 PM By: Montey Hora Entered By: Montey Hora on 05/21/2018 15:33:31 Baldwin, Joycelyn Schmid ([id number]) -------------------------------------------------------------------------------- Wound Assessment Details Patient Name: [name] Date of Service: 05/21/2018 3:00 PM Medical Record Number: [id number] Patient Account Number: [id number] Date of Birth/Sex: 1928/04/07 (84 y.o. F) Treating RN: [name] Barman Primary Care Sahiba Granholm: Park Liter Other Clinician: Referring Jerae Izard: Park Liter Treating Mayetta Castleman/Extender: Tito Dine in Treatment: 14 Wound Status Wound Number: 4 Primary Trauma, Other Etiology: Wound Location: Right Forearm - Proximal Wound Open Wounding Event: Trauma Status: Date Acquired: 04/21/2018 Comorbid Cataracts, Glaucoma, Asthma, Chronic Weeks Of Treatment: 4 History: Obstructive Pulmonary Disease (COPD), Clustered Wound: No Arrhythmia, Hypertension Photos Wound Measurements Length: (cm) 1 Width: (cm) 0.4 Depth: (cm) 0.1 Area: (cm) 0.314 Volume: (cm) 0.031 % Reduction in Area: 33.3% % Reduction in Volume: 34% Epithelialization: None Tunneling: No Undermining: No Wound Description Full Thickness Without Exposed Support Foul Odor Classification: Structures Slough/Fi Wound Margin: Distinct, outline attached Exudate None Present Amount: After Cleansing: No brino No Wound Bed Granulation Amount: Large (67-100%) Exposed Structure Granulation Quality: Red Fascia Exposed: No Necrotic Amount: None Present (0%) Fat Layer (Subcutaneous Tissue) Exposed: No Tendon Exposed: No Muscle Exposed: No Joint Exposed: No Bone Exposed: No Periwound Skin Texture Texture  Color Davidoff, Doloris ([id number]) No Abnormalities Noted: Yes No Abnormalities Noted: Yes Moisture Temperature / Pain No Abnormalities Noted: No Temperature: No Abnormality Dry / Scaly: No Tenderness on Palpation: Yes Maceration: No Wound Preparation Ulcer Cleansing: Rinsed/Irrigated with Saline Topical Anesthetic Applied: Other: lidocaine 4%, Treatment Notes Wound #4 (Right, Proximal Forearm) 1. Cleansed with: Clean wound with Normal Saline 2. Anesthetic Topical Lidocaine 4% cream to wound bed prior to debridement 4. Dressing Applied: Prisma Ag 5. Secondary Dressing Applied Dry Gauze Kerlix/Conform 7. Secured with Tape Notes Horticulturist, commercial) Signed: 05/22/2018 5:07:26 PM By: Gretta Cool, BSN, RN, CWS, Kim RN, BSN Entered By: Gretta Cool, BSN, RN, CWS, Kim on 05/21/2018 15:15:47 BIBI, ECONOMOS ([id number]) -------------------------------------------------------------------------------- [place] Details Patient Name: [name] Date of Service: 05/21/2018 3:00 PM Medical Record Number: [id number] Patient Account Number: [id number] Date of Birth/Sex: 1927/09/29 (84 y.o. F) Treating RN: Secundino Ginger Primary Care Shakera Ebrahimi: Park Liter Other Clinician: Referring Macgregor Aeschliman: Park Liter Treating Teffany Blaszczyk/Extender: Tito Dine in Treatment: 14 Vital Signs Time Taken: 15:02 Temperature (F): 97.8 Height (in): 60 Pulse (bpm): 76 Weight (lbs): 94.2 Respiratory Rate (breaths/min): 16 Body Mass Index (BMI): 18.4 Blood Pressure (mmHg): 156/70 Reference Range: 80 - 120 mg / dl Electronic Signature(s) Signed: 05/21/2018 3:42:50 PM By: Secundino Ginger Entered By: Secundino Ginger on 05/21/2018 15:05:02

## 2018-06-03 ENCOUNTER — Telehealth: Payer: Self-pay | Admitting: Cardiovascular Disease

## 2018-06-03 NOTE — Telephone Encounter (Signed)
Patient calling not sure if she is to call us or PCP  She states she is losing weight and not sure why.  She weights herself every morning and is around 90 something  But the last few days she's become more weak in the mornings and is losing about a pound a day  She states she is not changed anything in her diet  She states her anxiety Is making this worst  She is concerned and states when she wakes up she is so weak that she is unable to take her medications    Please call back  She said to call closer to the afternoon she has a hair appointment at 10 am today

## 2018-06-03 NOTE — Telephone Encounter (Signed)
Weight was 89 pounds on visit with me in August Lasix every other day Recent lab work 2 weeks ago did not show any change in kidney function or prerenal state I think weight loss is protein calorie malnutrition Will cc Park Liter May need social worker home visit to see if she is getting groceries and has food in her refrigerator

## 2018-06-03 NOTE — Telephone Encounter (Signed)
I spoke with the patient. She states that her weight typically runs in the low 90's, but she was 89 lbs over the weekend and 87 lbs today. Reviewed the patient's medications with her. Confirmed she has been taking lasix 20 mg once daily as of late due to SOB.  She also has COPD. She denies dizziness, but also complains of some pre-syncope at times where she feels like she needs to stabilize herself.   I advised the patient that I will need to review her symptoms with Dr. Rockey Situ, but have asked her to hold her lasix until we can call her back.  The patient voices understanding and is agreeable.

## 2018-06-04 ENCOUNTER — Encounter: Payer: Medicare Other | Attending: Internal Medicine | Admitting: Internal Medicine

## 2018-06-04 DIAGNOSIS — S41112A Laceration without foreign body of left upper arm, initial encounter: Secondary | ICD-10-CM | POA: Insufficient documentation

## 2018-06-04 DIAGNOSIS — X58XXXA Exposure to other specified factors, initial encounter: Secondary | ICD-10-CM | POA: Insufficient documentation

## 2018-06-04 NOTE — Telephone Encounter (Signed)
Spoke with patient and reviewed recommendations by Dr. Rockey Situ to reduce furosemide to every other day for now and that he had sent message over to Dr. Wynetta Emery regarding further assistance. She verbalized understanding of our conversation, agreement with plan, and had no further questions at this time.

## 2018-06-05 NOTE — Telephone Encounter (Signed)
Spoke with patient's daughter, she states that the patient is feeling better and that they will wait until 10/17 to be seen.

## 2018-06-05 NOTE — Telephone Encounter (Signed)
Can you please check to see if she's OK waiting to see me on the 17th to discuss her weight loss or if she wants to come in sooner?

## 2018-06-07 NOTE — Progress Notes (Signed)
RAYMONDE, HAMBLIN ([id number]) Visit Report for 06/04/2018 Arrival Information Details Patient Name: [name], [name] Date of Service: 06/04/2018 9:30 AM Medical Record Number: [id number] Patient Account Number: [id number] Date of Birth/Sex: 19-Jan-1928 (84 y.o. F) Treating RN: [name] Barman Primary Care Ezrah Panning: Park Liter Other Clinician: Referring Magie Ciampa: Park Liter Treating Pollyann Roa/Extender: Tito Dine in Treatment: 16 Visit Information History Since Last Visit Added or deleted any medications: No Patient Arrived: Ambulatory Any new allergies or adverse reactions: No Arrival Time: 09:30 Had a fall or experienced change in No Accompanied By: daughter activities of daily living that may affect Transfer Assistance: None risk of falls: Patient Identification Verified: Yes Signs or symptoms of abuse/neglect since last visito No Secondary Verification Process Completed: Yes Hospitalized since last visit: No Patient Requires Transmission-Based No Implantable device outside of the clinic excluding No Precautions: cellular tissue based products placed in the center Patient Has Alerts: No since last visit: Pain Present Now: No Electronic Signature(s) Signed: 06/04/2018 1:31:10 PM By: Lorine Bears RCP, RRT, CHT Entered By: Lorine Bears on 06/04/2018 09:34:49 Austin, Joycelyn Schmid ([id number]) -------------------------------------------------------------------------------- Encounter Discharge Information Details Patient Name: [name] Date of Service: 06/04/2018 9:30 AM Medical Record Number: [id number] Patient Account Number: [id number] Date of Birth/Sex: 04/21/1928 (84 y.o. F) Treating RN: Montey Hora Primary Care Shonnie Poudrier: Park Liter Other Clinician: Referring Kirstan Fentress: Park Liter Treating Ethal Gotay/Extender: Tito Dine in Treatment: 16 Encounter Discharge Information Items Discharge Condition:  Stable Ambulatory Status: Walker Discharge Destination: Home Transportation: Private Auto Accompanied By: friend Schedule Follow-up Appointment: Yes Clinical Summary of Care: Post Procedure Vitals: Temperature (F): 97.8 Pulse (bpm): 58 Respiratory Rate (breaths/min): 18 Blood Pressure (mmHg): 136/51 Electronic Signature(s) Signed: 06/04/2018 5:00:09 PM By: Montey Hora Entered By: Montey Hora on 06/04/2018 17:00:09 [name] ([id number]) -------------------------------------------------------------------------------- Lower Extremity Assessment Details Patient Name: [name] Date of Service: 06/04/2018 9:30 AM Medical Record Number: [id number] Patient Account Number: [id number] Date of Birth/Sex: 1927/09/22 (84 y.o. F) Treating RN: Secundino Ginger Primary Care Himmat Enberg: Park Liter Other Clinician: Referring Faren Florence: Park Liter Treating Dilan Fullenwider/Extender: Tito Dine in Treatment: 16 Electronic Signature(s) Signed: 06/04/2018 1:11:45 PM By: Secundino Ginger Entered By: Secundino Ginger on 06/04/2018 09:57:29 Gertsch, Joycelyn Schmid ([id number]) -------------------------------------------------------------------------------- Multi Wound Chart Details Patient Name: [name] Date of Service: 06/04/2018 9:30 AM Medical Record Number: [id number] Patient Account Number: [id number] Date of Birth/Sex: 1927/09/15 (84 y.o. F) Treating RN: [name] Barman Primary Care [place] Kretz: Park Liter Other Clinician: Referring Qaadir Kent: Park Liter Treating Natajah Derderian/Extender: Tito Dine in Treatment: 16 Vital Signs Height(in): 60 Pulse(bpm): 26 Weight(lbs): 94.2 Blood Pressure(mmHg): 136/51 Body Mass Index(BMI): 18 Temperature(F): 97.8 Respiratory Rate 16 (breaths/min): Photos: [4:No Photos] [5:No Photos] [N/A:N/A] Wound Location: [4:Right, Proximal Forearm] [5:Left Upper Arm - Posterior] [N/A:N/A] Wounding Event: [4:Trauma] [5:Trauma]  [N/A:N/A] Primary Etiology: [4:Trauma, Other] [5:Trauma, Other] [N/A:N/A] Comorbid History: [4:N/A] [5:Cataracts, Glaucoma, Asthma, N/A Chronic Obstructive Pulmonary Disease (COPD), Arrhythmia, Hypertension] Date Acquired: [4:04/21/2018] [5:05/22/2018] [N/A:N/A] Weeks of Treatment: [4:6] [5:0] [N/A:N/A] Wound Status: [4:Healed - Epithelialized] [5:Open] [N/A:N/A] Measurements L x W x D [4:0x0x0] [5:1.5x1x0.1] [N/A:N/A] (cm) Area (cm) : [4:0] [5:1.178] [N/A:N/A] Volume (cm) : [4:0] [5:0.118] [N/A:N/A] % Reduction in Area: [4:100.00%] [5:0.00%] [N/A:N/A] % Reduction in Volume: [4:100.00%] [5:0.00%] [N/A:N/A] Classification: [4:Full Thickness Without Exposed Support Structures] [5:Partial Thickness] [N/A:N/A] Exudate Amount: [4:N/A] [5:Small] [N/A:N/A] Exudate Type: [4:N/A] [5:Purulent] [N/A:N/A] Exudate Color: [4:N/A] [5:yellow, brown, green] [N/A:N/A] Granulation Amount: [4:N/A] [5:Small (1-33%)] [N/A:N/A] Necrotic Amount: [4:N/A] [5:Small (1-33%)] [N/A:N/A] Debridement: [4:N/A] [5:Debridement - Selective/Open Wound] [N/A:N/A] Pre-procedure [4:N/A] [5:10:22] [  N/A:N/A] Verification/Time Out Taken: Pain Control: [4:N/A] [5:Other] [N/A:N/A] Level: [4:N/A] [5:Skin/Dermis] [N/A:N/A] Debridement Area (sq cm): [4:N/A] [5:1.5] [N/A:N/A] Instrument: [4:N/A] [5:Scissors] [N/A:N/A] Bleeding: [4:N/A] [5:Minimum] [N/A:N/A] Hemostasis Achieved: [4:N/A] [5:Pressure] [N/A:N/A] Debridement Treatment [4:N/A] [5:Procedure was tolerated well] [N/A:N/A] Response: [4:N/A] [5:1.5x1x0.1] [N/A:N/A] Post Debridement Measurements L x W x D (cm) Post Debridement Volume: N/A 0.118 N/A (cm) Periwound Skin Texture: No Abnormalities Noted No Abnormalities Noted N/A Periwound Skin Moisture: No Abnormalities Noted No Abnormalities Noted N/A Periwound Skin Color: No Abnormalities Noted No Abnormalities Noted N/A Tenderness on Palpation: No No N/A Wound Preparation: N/A Ulcer Cleansing: N/A Rinsed/Irrigated  with Saline Topical Anesthetic Applied: Other: lidocaine 4% Procedures Performed: N/A Debridement N/A Treatment Notes Electronic Signature(s) Signed: 06/05/2018 5:56:52 AM By: Linton Ham MD Entered By: Linton Ham on 06/04/2018 11:16:15 [name] ([id number]) -------------------------------------------------------------------------------- Multi-Disciplinary Care Plan Details Patient Name: [name] Date of Service: 06/04/2018 9:30 AM Medical Record Number: [id number] Patient Account Number: [id number] Date of Birth/Sex: 05/23/1928 (84 y.o. F) Treating RN: [name] Barman Primary Care Mollee Neer: Park Liter Other Clinician: Referring Marijah Larranaga: Park Liter Treating Tanisa Lagace/Extender: Tito Dine in Treatment: 16 Active Inactive ` Necrotic Tissue Nursing Diagnoses: Impaired tissue integrity related to necrotic/devitalized tissue Goals: Necrotic/devitalized tissue will be minimized in the wound bed Date Initiated: 02/12/2018 Target Resolution Date: 03/07/2018 Goal Status: Active Interventions: Provide education on necrotic tissue and debridement process Treatment Activities: Apply topical anesthetic as ordered : 02/12/2018 Notes: ` Orientation to the Wound Care Program Nursing Diagnoses: Knowledge deficit related to the wound healing center program Goals: Patient/caregiver will verbalize understanding of the [place] Date Initiated: 02/12/2018 Target Resolution Date: 03/07/2018 Goal Status: Active Interventions: Provide education on orientation to the wound center Notes: ` Pain, Acute or Chronic Nursing Diagnoses: Potential alteration in comfort, pain Goals: Patient will verbalize adequate pain control and receive pain control interventions during procedures as needed Date Initiated: 02/12/2018 Target Resolution Date: 03/07/2018 [name], [name] ([id number]) Goal Status: Active Interventions: Reposition patient for  comfort Notes: ` Wound/Skin Impairment Nursing Diagnoses: Impaired tissue integrity Goals: Ulcer/skin breakdown will have a volume reduction of 80% by week 12 Date Initiated: 02/12/2018 Target Resolution Date: 05/15/2018 Goal Status: Active Interventions: Assess ulceration(s) every visit Treatment Activities: Patient referred to home care : 02/12/2018 Notes: Electronic Signature(s) Signed: 06/05/2018 3:05:38 PM By: Gretta Cool, BSN, RN, CWS, Kim RN, BSN Entered By: Gretta Cool, BSN, RN, CWS, Kim on 06/04/2018 10:21:56 [name] ([id number]) -------------------------------------------------------------------------------- Pain Assessment Details Patient Name: [name] Date of Service: 06/04/2018 9:30 AM Medical Record Number: [id number] Patient Account Number: [id number] Date of Birth/Sex: 1928/07/16 (84 y.o. F) Treating RN: [name] Barman Primary Care Calvary Difranco: Park Liter Other Clinician: Referring Marnae Madani: Park Liter Treating Sylina Henion/Extender: Tito Dine in Treatment: 16 Active Problems Location of Pain Severity and Description of Pain Patient Has Paino No Site Locations Pain Management and Medication Current Pain Management: Electronic Signature(s) Signed: 06/04/2018 1:31:10 PM By: Lorine Bears RCP, RRT, CHT Signed: 06/05/2018 3:05:38 PM By: Gretta Cool, BSN, RN, CWS, Kim RN, BSN Entered By: Lorine Bears on 06/04/2018 09:34:56 [name], [name] ([id number]) -------------------------------------------------------------------------------- Patient/Caregiver Education Details Patient Name: [name] Date of Service: 06/04/2018 9:30 AM Medical Record Number: [id number] Patient Account Number: [id number] Date of Birth/Gender: [birth year] (84 y.o. F) Treating RN: [name] Barman Primary Care Physician: Park Liter Other Clinician: Referring Physician: Park Liter Treating Physician/Extender: Tito Dine in  Treatment: 16 Education Assessment Education Provided To: Patient Education Topics Provided Wound Debridement: Handouts: Wound Debridement Methods: Demonstration Responses:  State content correctly Electronic Signature(s) Signed: 06/05/2018 3:05:38 PM By: Gretta Cool, BSN, RN, CWS, Kim RN, BSN Entered By: Gretta Cool, BSN, RN, CWS, Kim on 06/04/2018 10:26:04 [name] ([id number]) -------------------------------------------------------------------------------- Wound Assessment Details Patient Name: [name] Date of Service: 06/04/2018 9:30 AM Medical Record Number: [id number] Patient Account Number: [id number] Date of Birth/Sex: 09-16-1927 (84 y.o. F) Treating RN: Secundino Ginger Primary Care Champ Keetch: Park Liter Other Clinician: Referring Curlie Sittner: Park Liter Treating Arita Severtson/Extender: Tito Dine in Treatment: 16 Wound Status Wound Number: 4 Primary Etiology: Trauma, Other Wound Location: Right, Proximal Forearm Wound Status: Healed - Epithelialized Wounding Event: Trauma Date Acquired: 04/21/2018 Weeks Of Treatment: 6 Clustered Wound: No Photos Photo Uploaded By: Secundino Ginger on 06/04/2018 11:47:38 Wound Measurements Length: (cm) 0 Width: (cm) 0 Depth: (cm) 0 Area: (cm) 0 Volume: (cm) 0 % Reduction in Area: 100% % Reduction in Volume: 100% Wound Description Full Thickness Without Exposed Support Classification: Structures Periwound Skin Texture Texture Color No Abnormalities Noted: No No Abnormalities Noted: No Moisture No Abnormalities Noted: No Electronic Signature(s) Signed: 06/04/2018 1:11:45 PM By: Secundino Ginger Entered By: Secundino Ginger on 06/04/2018 09:57:00 [name] ([id number]) -------------------------------------------------------------------------------- Wound Assessment Details Patient Name: [name] Date of Service: 06/04/2018 9:30 AM Medical Record Number: [id number] Patient Account Number: [id number] Date of  Birth/Sex: 11/03/1927 (84 y.o. F) Treating RN: Secundino Ginger Primary Care Kenndra Morris: Park Liter Other Clinician: Referring Stpehen Petitjean: Park Liter Treating Tersa Fotopoulos/Extender: Tito Dine in Treatment: 16 Wound Status Wound Number: 5 Primary Trauma, Other Etiology: Wound Location: Left Upper Arm - Posterior Wound Open Wounding Event: Trauma Status: Date Acquired: 05/22/2018 Comorbid Cataracts, Glaucoma, Asthma, Chronic Weeks Of Treatment: 0 History: Obstructive Pulmonary Disease (COPD), Clustered Wound: No Arrhythmia, Hypertension Photos Photo Uploaded By: Secundino Ginger on 06/04/2018 11:50:32 Wound Measurements Length: (cm) 1.5 % Reduct Width: (cm) 1 % Reduct Depth: (cm) 0.1 Tunnelin Area: (cm) 1.178 Undermi Volume: (cm) 0.118 ion in Area: 0% ion in Volume: 0% g: No ning: No Wound Description Classification: Partial Thickness Exudate Amount: Small Exudate Type: Purulent Exudate Color: yellow, brown, green Foul Odor After Cleansing: No Slough/Fibrino Yes Wound Bed Granulation Amount: Small (1-33%) Exposed Structure Necrotic Amount: Small (1-33%) Fat Layer (Subcutaneous Tissue) Exposed: Yes Necrotic Quality: Adherent Slough Periwound Skin Texture Texture Color No Abnormalities Noted: Yes No Abnormalities Noted: Yes Moisture No Abnormalities Noted: No [name], [name] ([id number]) Wound Preparation Ulcer Cleansing: Rinsed/Irrigated with Saline Topical Anesthetic Applied: Other: lidocaine 4%, Treatment Notes Wound #5 (Left, Posterior Upper Arm) 1. Cleansed with: Clean wound with Normal Saline 2. Anesthetic Topical Lidocaine 4% cream to wound bed prior to debridement 4. Dressing Applied: Prisma Ag 5. Secondary Dressing Applied Kerlix/Conform Non-Adherent pad Notes netting Electronic Signature(s) Signed: 06/04/2018 1:11:45 PM By: Secundino Ginger Entered By: Secundino Ginger on 06/04/2018 10:05:36 [name]  ([id number]) -------------------------------------------------------------------------------- Vitals Details Patient Name: [name] Date of Service: 06/04/2018 9:30 AM Medical Record Number: [id number] Patient Account Number: [id number] Date of Birth/Sex: September 25, 1927 (84 y.o. F) Treating RN: [name] Barman Primary Care Eda Magnussen: Park Liter Other Clinician: Referring Scottie Stanish: Park Liter Treating Treshun Wold/Extender: Tito Dine in Treatment: 16 Vital Signs Time Taken: 09:35 Temperature (F): 97.8 Height (in): 60 Pulse (bpm): 58 Weight (lbs): 94.2 Respiratory Rate (breaths/min): 16 Body Mass Index (BMI): 18.4 Blood Pressure (mmHg): 136/51 Reference Range: 80 - 120 mg / dl Electronic Signature(s) Signed: 06/04/2018 1:31:10 PM By: Lorine Bears RCP, RRT, CHT Entered By: Lorine Bears on 06/04/2018 09:38:04

## 2018-06-07 NOTE — Progress Notes (Signed)
[name], [name] ([id number]) Visit Report for 06/04/2018 Debridement Details Patient Name: [name], [name] Date of Service: 06/04/2018 9:30 AM Medical Record Number: [id number] Patient Account Number: [id number] Date of Birth/Sex: 03-26-1928 (84 y.o. F) Treating RN: [name] Barman Primary Care Provider: Park Liter Other Clinician: Referring Provider: Park Liter Treating Provider/Extender: Tito Dine in Treatment: 16 Debridement Performed for Wound #5 Left,Posterior Upper Arm Assessment: Performed By: Physician Ricard Dillon, MD Debridement Type: Debridement Level of Consciousness (Pre- Awake and Alert procedure): Pre-procedure Verification/Time Yes - 10:22 Out Taken: Start Time: 10:23 Pain Control: Other : lidocaine 4% Total Area Debrided (L x W): 1.5 (cm) x 1 (cm) = 1.5 (cm) Tissue and other material Non-Viable, Skin: Dermis debrided: Level: Skin/Dermis Debridement Description: Selective/Open Wound Instrument: Scissors Bleeding: Minimum Hemostasis Achieved: Pressure End Time: 10:25 Response to Treatment: Procedure was tolerated well Level of Consciousness Awake and Alert (Post-procedure): Post Debridement Measurements of Total Wound Length: (cm) 1.5 Width: (cm) 1 Depth: (cm) 0.1 Volume: (cm) 0.118 Character of Wound/Ulcer Post Debridement: Requires Further Debridement Post Procedure Diagnosis Same as Pre-procedure Electronic Signature(s) Signed: 06/05/2018 5:56:52 AM By: Linton Ham MD Signed: 06/05/2018 3:05:38 PM By: Gretta Cool, BSN, RN, CWS, Kim RN, BSN Entered By: Linton Ham on 06/04/2018 11:16:44 [name] ([id number]) -------------------------------------------------------------------------------- HPI Details Patient Name: [name] Date of Service: 06/04/2018 9:30 AM Medical Record Number: [id number] Patient Account Number: [id number] Date of Birth/Sex: 01-16-1928 (84 y.o. F) Treating RN: [name] Barman Primary  Care Provider: Park Liter Other Clinician: Referring Provider: Park Liter Treating Provider/Extender: Tito Dine in Treatment: 16 History of Present Illness HPI Description: 02/12/18 ADMISSION This is an 84 year old woman who is recently moved to [place] from [place]. Her story began in February where she fell on some ice suffering extensive lacerations to her bilateral lower extremities. She is able to show me extensive pictures of the right lower leg but with going to a wound care center and [place] 3 times a week these eventually closed over. She has been left with 1 area on the upper lateral left calf. She has been applying Neosporin to this and applying a bandage. Currently this measures 2 x 1.5 cm. The patient is not a diabetic. She is an ex-smoker quitting 40 years ago. She does have COPD by description. ABIs in our clinic were 1.32 on the right and 1.03 on the left 02/19/18; right lower leg wound which was initially trauma. The area that we look that last week is smaller. Still covered in a nonviable surface however. We are using Iodoflex 02/26/18; right lower leg wound which was initially trauma in the setting of chronic venous insufficiency. Surface of the wound looks much better healthy granulation advancing epithelialization. We have good edema control 03/05/18; right lower leg wound which was initially trauma in the setting of chronic venous insufficiency. She continues to make nice progress here. We have good edema control oHe arrives today with a new traumatic laceration on her right dorsal forearm which she states happened while she was putting on a sweatshirt 03/12/18; right lower leg wound as closed however it still looks vulnerable. She has chronic venous insufficiency. oThe new wound from last week a traumatic area on her right dorsal or arm unfortunately does not have a viable surface. I remove some nonviable skin and necrotic subcutaneous debris from  the wound surface. Hemostasis with silver nitrate and direct pressure 03/19/18; right lower leg wound is closed. She has chronic venous insufficiency and will need ongoing compression oThe new  wounds from 2 weeks ago on the right upper elbow is closed. The area on her right dorsal forearm continues to have a nonviable surface requiring debridement 03/26/18; we'll need to look into ongoing compression for her legs. 2 wounds on the right upper elbow is closed. The area on the right dorsal forearm looks better. Hydrofera Blue secondary to hypertrophic granulation 04/02/18; he has compression stockings although she did not wear them today. Severe chronic venous insufficiency. She has 2 wounds on the right upper elbow which I said were closed last week which actually are although they're very small and she has a smaller clean wound on the right dorsal forearm. We've been using Hydrofera Blue secondary to some upper granulation. All of this looks better 04/09/18-She is seen in follow up evaluation for a right elbow and right forearm skin tear. The right elbow is healed. We will continue with same treatment plan and she'll follow next week 04/16/18-She is seen in follow-up evaluation for right forearm skin tear, this is essentially healed. We will cover with foam border and she will follow-up next week 04/23/18; the patient arrives with a new skin tear on her right dorsal arm just below the elbow. She got this while carrying a box. She has a linear skin tear. There is no depth I don't think this should've been sutured. There is a skin flap medially I'm not sure if this will be viable or not 04/30/18; the new skin tear from last week unfortunately has remained viable in terms of the skin flap. She has a small open area that looks healthy. Using moistened silver collagen 05/07/18; right dorsal arm skin tear. Nonviable tissue over the remnants of the wound. We've been using silver collagen 05/21/18; right dorsal arm  skin tear. Nonviable tissue over a small remnant of the wound was washed off. We've been using silver collagen which we will continue 06/04/18; the right dorsal arm skin tear has healed. She arrives today with a traumatic wound just above the olecranon of her left elbow. This is a skin tear with a nonviable nonadherent flap which was removed. We'll use silver collagen here. Also noteworthy she arrives without her compression stockings KRISTENSEN, Jahaira ([id number]) Electronic Signature(s) Signed: 06/05/2018 5:56:52 AM By: Linton Ham MD Entered By: Linton Ham on 06/04/2018 12:11:16 BRISELDA, NAVAL ([id number]) -------------------------------------------------------------------------------- Physical Exam Details Patient Name: [name] Date of Service: 06/04/2018 9:30 AM Medical Record Number: [id number] Patient Account Number: [id number] Date of Birth/Sex: March 10, 1928 (84 y.o. F) Treating RN: [name] Barman Primary Care Provider: Park Liter Other Clinician: Referring Provider: Park Liter Treating Provider/Extender: Tito Dine in Treatment: 16 Constitutional Sitting or standing Blood Pressure is within target range for patient.. Pulse regular and within target range for patient.Marland Kitchen Respirations regular, non-labored and within target range.. Temperature is normal and within the target range for the patient.Marland Kitchen appears in no distress. Integumentary (Hair, Skin) Severe fragile skin in both her arms and legs. I think this is a combination of solar-induced damage, chronic venous insufficiency. Notes Wound exam; the new area on the dorsal forearm from 3 or 4 weeks ago was closed. However she arrives in clinic with a new traumatic laceration over the olecranon of her left elbow. This had a nonviable skin flap which I removed with scissors and pickups. She arrived without any stockings on her legs which we've had difficulty with before Electronic Signature(s) Signed:  06/05/2018 5:56:52 AM By: Linton Ham MD Entered By: Linton Ham on 06/04/2018 12:15:14 Mcguffin, [place] ([id number]) --------------------------------------------------------------------------------  Physician Orders Details Patient Name: DANNIELA, MCBREARTY Date of Service: 06/04/2018 9:30 AM Medical Record Number: [id number] Patient Account Number: [id number] Date of Birth/Sex: 03-12-1928 (84 y.o. F) Treating RN: [name] Barman Primary Care Provider: Park Liter Other Clinician: Referring Provider: Park Liter Treating Provider/Extender: Tito Dine in Treatment: 19 Verbal / Phone Orders: No Diagnosis Coding Wound Cleansing Wound #5 Left,Posterior Upper Arm o Clean wound with Normal Saline. Anesthetic (add to Medication List) Wound #5 Left,Posterior Upper Arm o Topical Lidocaine 4% cream applied to wound bed prior to debridement (In Clinic Only). Primary Wound Dressing Wound #5 Left,Posterior Upper Arm o Silver Collagen Secondary Dressing Wound #5 Left,Posterior Upper Arm o Conform/Kerlix - secured with stretchnet Dressing Change Frequency Wound #5 Left,Posterior Upper Arm o Three times weekly Follow-up Appointments Wound #5 Left,Posterior Upper Arm o Return Appointment in 2 weeks. Electronic Signature(s) Signed: 06/05/2018 5:56:52 AM By: Linton Ham MD Signed: 06/05/2018 3:05:38 PM By: Gretta Cool BSN, RN, CWS, Kim RN, BSN Entered By: Gretta Cool, BSN, RN, CWS, Kim on 06/04/2018 10:25:44 JERITA, WIMBUSH ([id number]) -------------------------------------------------------------------------------- Problem List Details Patient Name: [name] Date of Service: 06/04/2018 9:30 AM Medical Record Number: [id number] Patient Account Number: [id number] Date of Birth/Sex: [birth year] (84 y.o. F) Treating RN: [name] Barman Primary Care Provider: Park Liter Other Clinician: Referring Provider: Park Liter Treating Provider/Extender: Tito Dine in Treatment: 16 Active Problems ICD-10 Evaluated Encounter Code Description Active Date Today Diagnosis S41.112D Laceration without foreign body of left upper arm, subsequent 06/04/2018 No Yes encounter Inactive Problems Resolved Problems ICD-10 Code Description Active Date Resolved Date L97.222 Non-pressure chronic ulcer of left calf with fat layer exposed 02/12/2018 02/12/2018 I87.311 Chronic venous hypertension (idiopathic) with ulcer of right lower 02/12/2018 02/12/2018 extremity S81.812D Laceration without foreign body, left lower leg, subsequent 02/12/2018 02/12/2018 encounter S41.111D Laceration without foreign body of right upper arm, subsequent 03/05/2018 03/05/2018 encounter Electronic Signature(s) Signed: 06/05/2018 5:56:52 AM By: Linton Ham MD Entered By: Linton Ham on 06/04/2018 12:18:06 [name] ([id number]) -------------------------------------------------------------------------------- Progress Note Details Patient Name: [name] Date of Service: 06/04/2018 9:30 AM Medical Record Number: [id number] Patient Account Number: [id number] Date of Birth/Sex: 1928/07/21 (84 y.o. F) Treating RN: [name] Barman Primary Care Provider: Park Liter Other Clinician: Referring Provider: Park Liter Treating Provider/Extender: Tito Dine in Treatment: 16 Subjective History of Present Illness (HPI) 02/12/18 ADMISSION This is an 84 year old woman who is recently moved to [place] from [place]. Her story began in February where she fell on some ice suffering extensive lacerations to her bilateral lower extremities. She is able to show me extensive pictures of the right lower leg but with going to a wound care center and [place] 3 times a week these eventually closed over. She has been left with 1 area on the upper lateral left calf. She has been applying Neosporin to this and applying a bandage. Currently this measures 2 x 1.5 cm. The  patient is not a diabetic. She is an ex-smoker quitting 40 years ago. She does have COPD by description. ABIs in our clinic were 1.32 on the right and 1.03 on the left 02/19/18; right lower leg wound which was initially trauma. The area that we look that last week is smaller. Still covered in a nonviable surface however. We are using Iodoflex 02/26/18; right lower leg wound which was initially trauma in the setting of chronic venous insufficiency. Surface of the wound looks much better healthy granulation advancing epithelialization. We have good edema control 03/05/18; right lower leg wound  which was initially trauma in the setting of chronic venous insufficiency. She continues to make nice progress here. We have good edema control He arrives today with a new traumatic laceration on her right dorsal forearm which she states happened while she was putting on a sweatshirt 03/12/18; right lower leg wound as closed however it still looks vulnerable. She has chronic venous insufficiency. The new wound from last week a traumatic area on her right dorsal or arm unfortunately does not have a viable surface. I remove some nonviable skin and necrotic subcutaneous debris from the wound surface. Hemostasis with silver nitrate and direct pressure 03/19/18; right lower leg wound is closed. She has chronic venous insufficiency and will need ongoing compression The new wounds from 2 weeks ago on the right upper elbow is closed. The area on her right dorsal forearm continues to have a nonviable surface requiring debridement 03/26/18; we'll need to look into ongoing compression for her legs. 2 wounds on the right upper elbow is closed. The area on the right dorsal forearm looks better. Hydrofera Blue secondary to hypertrophic granulation 04/02/18; he has compression stockings although she did not wear them today. Severe chronic venous insufficiency. She has 2 wounds on the right upper elbow which I said were closed last  week which actually are although they're very small and she has a smaller clean wound on the right dorsal forearm. We've been using Hydrofera Blue secondary to some upper granulation. All of this looks better 04/09/18-She is seen in follow up evaluation for a right elbow and right forearm skin tear. The right elbow is healed. We will continue with same treatment plan and she'll follow next week 04/16/18-She is seen in follow-up evaluation for right forearm skin tear, this is essentially healed. We will cover with foam border and she will follow-up next week 04/23/18; the patient arrives with a new skin tear on her right dorsal arm just below the elbow. She got this while carrying a box. She has a linear skin tear. There is no depth I don't think this should've been sutured. There is a skin flap medially I'm not sure if this will be viable or not 04/30/18; the new skin tear from last week unfortunately has remained viable in terms of the skin flap. She has a small open area that looks healthy. Using moistened silver collagen 05/07/18; right dorsal arm skin tear. Nonviable tissue over the remnants of the wound. We've been using silver collagen 05/21/18; right dorsal arm skin tear. Nonviable tissue over a small remnant of the wound was washed off. We've been using silver collagen which we will continue 06/04/18; the right dorsal arm skin tear has healed. She arrives today with a traumatic wound just above the olecranon of her left elbow. This is a skin tear with a nonviable nonadherent flap which was removed. We'll use silver collagen here. Also noteworthy she arrives without her compression stockings Dowding, Justine ([id number]) Objective Constitutional Sitting or standing Blood Pressure is within target range for patient.. Pulse regular and within target range for patient.Marland Kitchen Respirations regular, non-labored and within target range.. Temperature is normal and within the target range for the  patient.Marland Kitchen appears in no distress. Vitals Time Taken: 9:35 AM, Height: 60 in, Weight: 94.2 lbs, BMI: 18.4, Temperature: 97.8 F, Pulse: 58 bpm, Respiratory Rate: 16 breaths/min, Blood Pressure: 136/51 mmHg. General Notes: Wound exam; the new area on the dorsal forearm from 3 or 4 weeks ago was closed. However she arrives in clinic with a new  traumatic laceration over the olecranon of her left elbow. This had a nonviable skin flap which I removed with scissors and pickups. She arrived without any stockings on her legs which we've had difficulty with before Integumentary (Hair, Skin) Severe fragile skin in both her arms and legs. I think this is a combination of solar-induced damage, chronic venous insufficiency. Wound #4 status is Healed - Epithelialized. Original cause of wound was Trauma. The wound is located on the Right,Proximal Forearm. The wound measures 0cm length x 0cm width x 0cm depth; 0cm^2 area and 0cm^3 volume. Wound #5 status is Open. Original cause of wound was Trauma. The wound is located on the Left,Posterior Upper Arm. The wound measures 1.5cm length x 1cm width x 0.1cm depth; 1.178cm^2 area and 0.118cm^3 volume. There is Fat Layer (Subcutaneous Tissue) Exposed exposed. There is no tunneling or undermining noted. There is a small amount of purulent drainage noted. There is small (1-33%) granulation within the wound bed. There is a small (1-33%) amount of necrotic tissue within the wound bed including Adherent Slough. The periwound skin appearance had no abnormalities noted for texture. The periwound skin appearance had no abnormalities noted for color. Assessment Active Problems ICD-10 Laceration without foreign body of left upper arm, subsequent encounter Procedures Wound #5 Pre-procedure diagnosis of Wound #5 is a Trauma, Other located on the Left,Posterior Upper Arm . There was a Selective/Open Wound Skin/Dermis Debridement with a total area of 1.5 sq cm performed by  Ricard Dillon, MD. [name] ([id number]) With the following instrument(s): Scissors to remove Non-Viable tissue/material. Material removed includes Skin: Dermis after achieving pain control using Other (lidocaine 4%). No specimens were taken. A time out was conducted at 10:22, prior to the start of the procedure. A Minimum amount of bleeding was controlled with Pressure. The procedure was tolerated well. Post Debridement Measurements: 1.5cm length x 1cm width x 0.1cm depth; 0.118cm^3 volume. Character of Wound/Ulcer Post Debridement requires further debridement. Post procedure Diagnosis Wound #5: Same as Pre-Procedure Plan Wound Cleansing: Wound #5 Left,Posterior Upper Arm: Clean wound with Normal Saline. Anesthetic (add to Medication List): Wound #5 Left,Posterior Upper Arm: Topical Lidocaine 4% cream applied to wound bed prior to debridement (In Clinic Only). Primary Wound Dressing: Wound #5 Left,Posterior Upper Arm: Silver Collagen Secondary Dressing: Wound #5 Left,Posterior Upper Arm: Conform/Kerlix - secured with stretchnet Dressing Change Frequency: Wound #5 Left,Posterior Upper Arm: Three times weekly Follow-up Appointments: Wound #5 Left,Posterior Upper Arm: Return Appointment in 2 weeks. I used the same silver collagen Kerlix coban to the new area on the left arm. #2 the area on the right forearm is healed #3 again she is noncompliant with her stockings. I have talked to her about this Electronic Signature(s) Signed: 06/05/2018 5:56:52 AM By: Linton Ham MD Entered By: Linton Ham on 06/04/2018 12:19:25 [name] ([id number]) -------------------------------------------------------------------------------- SuperBill Details Patient Name: [name] Date of Service: 06/04/2018 Medical Record Number: [id number] Patient Account Number: [id number] Date of Birth/Sex: 1927-09-21 (84 y.o. F) Treating RN: [name] Barman Primary Care Provider:  Park Liter Other Clinician: Referring Provider: Park Liter Treating Provider/Extender: Tito Dine in Treatment: 16 Diagnosis Coding ICD-10 Codes Code Description S41.112D Laceration without foreign body of left upper arm, subsequent encounter Facility Procedures CPT4 Code: [id number] Description: [phone] - DEBRIDE WOUND 1ST 20 SQ CM OR < ICD-10 Diagnosis Description S41.112D Laceration without foreign body of left upper arm, subsequen Modifier: t encounter Quantity: 1 Physician Procedures CPT4 Code: [id number] Description: 40102 - WC PHYS DEBR WO ANESTH  20 SQ CM ICD-10 Diagnosis Description S41.112D Laceration without foreign body of left upper arm, subsequen Modifier: t encounter Quantity: 1 Electronic Signature(s) Signed: 06/05/2018 5:56:52 AM By: Linton Ham MD Entered By: Linton Ham on 06/04/2018 12:19:38

## 2018-06-08 DIAGNOSIS — J449 Chronic obstructive pulmonary disease, unspecified: Secondary | ICD-10-CM | POA: Diagnosis not present

## 2018-06-08 DIAGNOSIS — J432 Centrilobular emphysema: Secondary | ICD-10-CM | POA: Diagnosis not present

## 2018-06-18 ENCOUNTER — Encounter: Payer: Medicare Other | Admitting: Internal Medicine

## 2018-06-18 DIAGNOSIS — S41102A Unspecified open wound of left upper arm, initial encounter: Secondary | ICD-10-CM | POA: Diagnosis not present

## 2018-06-18 DIAGNOSIS — S41112A Laceration without foreign body of left upper arm, initial encounter: Secondary | ICD-10-CM | POA: Diagnosis not present

## 2018-06-19 ENCOUNTER — Encounter: Payer: Self-pay | Admitting: Family Medicine

## 2018-06-19 ENCOUNTER — Ambulatory Visit (INDEPENDENT_AMBULATORY_CARE_PROVIDER_SITE_OTHER): Payer: Medicare Other | Admitting: Family Medicine

## 2018-06-19 VITALS — BP 173/83 | HR 97 | Temp 98.1°F | Wt 93.1 lb

## 2018-06-19 DIAGNOSIS — E440 Moderate protein-calorie malnutrition: Secondary | ICD-10-CM

## 2018-06-19 DIAGNOSIS — R531 Weakness: Secondary | ICD-10-CM | POA: Diagnosis not present

## 2018-06-19 DIAGNOSIS — R634 Abnormal weight loss: Secondary | ICD-10-CM

## 2018-06-19 DIAGNOSIS — D692 Other nonthrombocytopenic purpura: Secondary | ICD-10-CM

## 2018-06-19 DIAGNOSIS — N3281 Overactive bladder: Secondary | ICD-10-CM

## 2018-06-19 DIAGNOSIS — R718 Other abnormality of red blood cells: Secondary | ICD-10-CM

## 2018-06-19 DIAGNOSIS — E46 Unspecified protein-calorie malnutrition: Secondary | ICD-10-CM | POA: Insufficient documentation

## 2018-06-19 DIAGNOSIS — I10 Essential (primary) hypertension: Secondary | ICD-10-CM

## 2018-06-19 MED ORDER — FERROUS SULFATE 325 (65 FE) MG PO TABS: 325.0000 mg | ORAL_TABLET | Freq: Three times a day (TID) | ORAL | 3 refills | Status: DC

## 2018-06-19 NOTE — Assessment & Plan Note (Signed)
Appears to be stable on her weight. Will continue to monitor. Continue ensure. Call with any concerns.

## 2018-06-19 NOTE — Assessment & Plan Note (Signed)
Reassured patient. Continue to monitor.  

## 2018-06-19 NOTE — Progress Notes (Signed)
BP (!) 173/83 (BP Location: Left Arm, Patient Position: Sitting, Cuff Size: Small)   Pulse 97   Temp 98.1 F (36.7 C)   Wt 93 lb 1 oz (42.2 kg)   SpO2 93%   BMI 18.18 kg/m    Subjective:    Patient ID: [name], female    DOB: 06/01/1928, 84 y.o.   MRN: [id number]  HPI: [name] is a 84 y.o. female  Chief Complaint  Patient presents with  . overactive bladder   URINARY SYMPTOMS- was not peeing very often last visit. Was having some trouble getting her urine out, so was taken off her vesicare. She is doing well off of it Duration: chronic Dysuria: no Urinary frequency: no Urgency: no Small volume voids: no Symptom severity: mild Urinary incontinence: yes Foul odor: no Hematuria: no Abdominal pain: no Back pain: no Suprapubic pain/pressure: no Flank pain: no Fever:  no Vomiting: no Relief with cranberry juice: no Relief with pyridium: no Status: stable Previous urinary tract infection: no Recurrent urinary tract infection: no Sexual activity: monogomous History of sexually transmitted disease: no Vaginal discharge: no Treatments attempted: none   Has been feeling weak and shakey and feels like she is going to fall out. No dizziness or light-headedness. She notes that after the 3rd or 4th day on the lasix she started feeling shakey and not well. She is still feeling bad in the AM since coming off the lasix. She states that she feels a little better after eating something in the AM.   She states that she has been eating TV dinners, sandwiches, cookies. States that she keeps her freezer stocked at all times. No one helps her with her food. She states that she doesn't feel hungry, but knows it's time to eat.   Relevant past medical, surgical, family and social history reviewed and updated as indicated. Interim medical history since our last visit reviewed. Allergies and medications reviewed and updated.  Review of Systems  Constitutional: Positive  for chills, diaphoresis and fatigue. Negative for activity change, appetite change, fever and unexpected weight change.  HENT: Negative.   Respiratory: Negative.   Cardiovascular: Negative.   Musculoskeletal: Negative.   Neurological: Positive for weakness. Negative for dizziness, tremors, seizures, syncope, facial asymmetry, speech difficulty, light-headedness, numbness and headaches.  Psychiatric/Behavioral: Negative.     Per HPI unless specifically indicated above     Objective:    BP (!) 173/83 (BP Location: Left Arm, Patient Position: Sitting, Cuff Size: Small)   Pulse 97   Temp 98.1 F (36.7 C)   Wt 93 lb 1 oz (42.2 kg)   SpO2 93%   BMI 18.18 kg/m   Wt Readings from Last 3 Encounters:  06/22/18 90 lb (40.8 kg)  06/19/18 93 lb 1 oz (42.2 kg)  05/19/18 93 lb 1.6 oz (42.2 kg)    Physical Exam  Constitutional: She is oriented to person, place, and time. She appears well-developed and well-nourished. No distress.  HENT:  Head: Normocephalic and atraumatic.  Right Ear: Hearing normal.  Left Ear: Hearing normal.  Nose: Nose normal.  Eyes: Conjunctivae and lids are normal. Right eye exhibits no discharge. Left eye exhibits no discharge. No scleral icterus.  Cardiovascular: Normal rate, regular rhythm, normal heart sounds and intact distal pulses. Exam reveals no gallop and no friction rub.  No murmur heard. Pulmonary/Chest: Effort normal and breath sounds normal. No stridor. No respiratory distress. She has no wheezes. She has no rales. She exhibits no tenderness.  Musculoskeletal:  Normal range of motion.  Neurological: She is alert and oriented to person, place, and time.  Skin: Skin is warm, dry and intact. Capillary refill takes less than 2 seconds. No rash noted. She is not diaphoretic. No erythema. There is pallor.  Psychiatric: She has a normal mood and affect. Her speech is normal and behavior is normal. Judgment and thought content normal. Cognition and memory are  normal.  Nursing note and vitals reviewed.   Results for orders placed or performed in visit on 02/63/78  Basic metabolic panel  Result Value Ref Range   Glucose 90 65 - 99 mg/dL   BUN 22 8 - 27 mg/dL   Creatinine, Ser 1.14 (H) 0.57 - 1.00 mg/dL   GFR calc non Af Amer 43 (L) >59 mL/min/1.73   GFR calc Af Amer 50 (L) >59 mL/min/1.73   BUN/Creatinine Ratio 19 12 - 28   Sodium 138 134 - 144 mmol/L   Potassium 4.9 3.5 - 5.2 mmol/L   Chloride 100 96 - 106 mmol/L   CO2 24 20 - 29 mmol/L   Calcium 9.2 8.7 - 10.3 mg/dL  UA/M w/rflx Culture, Routine  Result Value Ref Range   Specific Gravity, UA 1.015 1.005 - 1.030   pH, UA 7.0 5.0 - 7.5   Color, UA Yellow Yellow   Appearance Ur Clear Clear   Leukocytes, UA Negative Negative   Protein, UA Negative Negative/Trace   Glucose, UA Negative Negative   Ketones, UA Negative Negative   RBC, UA Negative Negative   Bilirubin, UA Negative Negative   Urobilinogen, Ur 0.2 0.2 - 1.0 mg/dL   Nitrite, UA Negative Negative  CBC With Differential/Platelet  Result Value Ref Range   WBC WILL FOLLOW    RBC WILL FOLLOW    Hemoglobin WILL FOLLOW    Hematocrit WILL FOLLOW    MCV WILL FOLLOW    MCH WILL FOLLOW    MCHC WILL FOLLOW    RDW WILL FOLLOW    Platelets WILL FOLLOW    Neutrophils WILL FOLLOW    Lymphs WILL FOLLOW    MID WILL FOLLOW    Neutrophils Absolute WILL FOLLOW    Lymphocytes Absolute WILL FOLLOW    MID (Absolute) WILL FOLLOW    Hematology Comments: WILL FOLLOW       Assessment & Plan:   Problem List Items Addressed This Visit      Cardiovascular and Mediastinum   Essential hypertension    Not under good control today. Will continue current regimen and continue to monitor closely.       Senile purpura ([place])    Reassured patient. Continue to monitor.       Relevant Orders   Basic metabolic panel (Completed)   CBC With Differential/Platelet (Completed)     Genitourinary   Overactive bladder    Does not appear to have  overactive bladder. Doing fine off vesicare. We will stay off it. Continue to monitor.       Relevant Orders   UA/M w/rflx Culture, Routine (Completed)     Other   Protein-calorie malnutrition (Regino Ramirez)    Appears to be stable on her weight. Will continue to monitor. Continue ensure. Call with any concerns.      Relevant Orders   CBC With Differential/Platelet (Completed)    Other Visit Diagnoses    Weakness    -  Primary   Will check labs. Await results. Keep close follow up.    Weight loss  Appears to be stable on her weight. Will continue to monitor. Continue ensure. Call with any concerns.   Microcytosis       Checking labs. Await results.        Follow up plan: Return Pending results. Marland Kitchen

## 2018-06-19 NOTE — Assessment & Plan Note (Signed)
Does not appear to have overactive bladder. Doing fine off vesicare. We will stay off it. Continue to monitor.

## 2018-06-20 LAB — BASIC METABOLIC PANEL
BUN/Creatinine Ratio: 19 (ref 12–28)
BUN: 22 mg/dL (ref 8–27)
CO2: 24 mmol/L (ref 20–29)
Calcium: 9.2 mg/dL (ref 8.7–10.3)
Chloride: 100 mmol/L (ref 96–106)
Creatinine, Ser: 1.14 mg/dL — ABNORMAL HIGH (ref 0.57–1.00)
GFR calc Af Amer: 50 mL/min/{1.73_m2} — ABNORMAL LOW (ref >59)
GFR calc non Af Amer: 43 mL/min/{1.73_m2} — ABNORMAL LOW (ref >59)
Glucose: 90 mg/dL (ref 65–99)
Potassium: 4.9 mmol/L (ref 3.5–5.2)
Sodium: 138 mmol/L (ref 134–144)

## 2018-06-20 NOTE — Progress Notes (Addendum)
TAHRA, HITZEMAN ([id number]) Visit Report for 06/18/2018 Arrival Information Details Patient Name: [name], [name] Date of Service: 06/18/2018 12:30 PM Medical Record Number: [id number] Patient Account Number: [id number] Date of Birth/Sex: [birth year] (84 y.o. F) Treating RN: Secundino Ginger Primary Care Larra Crunkleton: Park Liter Other Clinician: Referring Janazia Schreier: Park Liter Treating Vi Whitesel/Extender: Tito Dine in Treatment: 18 Visit Information History Since Last Visit Added or deleted any medications: No Patient Arrived: Ambulatory Any new allergies or adverse reactions: No Arrival Time: 12:36 Had a fall or experienced change in No Accompanied By: self activities of daily living that may affect Transfer Assistance: None risk of falls: Patient Identification Verified: Yes Signs or symptoms of abuse/neglect since last visito No Secondary Verification Process Completed: Yes Hospitalized since last visit: No Patient Requires Transmission-Based No Implantable device outside of the clinic excluding No Precautions: cellular tissue based products placed in the center Patient Has Alerts: No since last visit: Has Dressing in Place as Prescribed: Yes Pain Present Now: No Electronic Signature(s) Signed: 06/18/2018 4:03:08 PM By: Secundino Ginger Entered By: Secundino Ginger on 06/18/2018 12:39:38 [name] ([id number]) -------------------------------------------------------------------------------- Clinic Level of Care Assessment Details Patient Name: [name] Date of Service: 06/18/2018 12:30 PM Medical Record Number: [id number] Patient Account Number: [id number] Date of Birth/Sex: [birth year] (84 y.o. F) Treating RN: [name] Barman Primary Care Jozey Janco: Park Liter Other Clinician: Referring Kikue Gerhart: Park Liter Treating Solace Wendorff/Extender: Tito Dine in Treatment: 18 Clinic Level of Care Assessment Items TOOL 4 Quantity Score []  - Use when  only an EandM is performed on FOLLOW-UP visit 0 ASSESSMENTS - Nursing Assessment / Reassessment []  - Reassessment of Co-morbidities (includes updates in patient status) 0 X- 1 5 Reassessment of Adherence to Treatment Plan ASSESSMENTS - Wound and Skin Assessment / Reassessment X - Simple Wound Assessment / Reassessment - one wound 1 5 []  - 0 Complex Wound Assessment / Reassessment - multiple wounds []  - 0 Dermatologic / Skin Assessment (not related to wound area) ASSESSMENTS - Focused Assessment []  - Circumferential Edema Measurements - multi extremities 0 []  - 0 Nutritional Assessment / Counseling / Intervention []  - 0 Lower Extremity Assessment (monofilament, tuning fork, pulses) []  - 0 Peripheral Arterial Disease Assessment (using hand held doppler) ASSESSMENTS - Ostomy and/or Continence Assessment and Care []  - Incontinence Assessment and Management 0 []  - 0 Ostomy Care Assessment and Management (repouching, etc.) PROCESS - Coordination of Care X - Simple Patient / Family Education for ongoing care 1 15 []  - 0 Complex (extensive) Patient / Family Education for ongoing care []  - 0 Staff obtains Programmer, systems, Records, Test Results / Process Orders []  - 0 Staff telephones HHA, Nursing Homes / Clarify orders / etc []  - 0 Routine Transfer to another Facility (non-emergent condition) []  - 0 Routine Hospital Admission (non-emergent condition) []  - 0 New Admissions / Biomedical engineer / Ordering NPWT, Apligraf, etc. []  - 0 Emergency Hospital Admission (emergent condition) X- 1 10 Simple Discharge Coordination Roeder, Ericca ([id number]) []  - 0 Complex (extensive) Discharge Coordination PROCESS - Special Needs []  - Pediatric / Minor Patient Management 0 []  - 0 Isolation Patient Management []  - 0 Hearing / Language / Visual special needs []  - 0 Assessment of Community assistance (transportation, D/C planning, etc.) []  - 0 Additional assistance / Altered  mentation []  - 0 Support Surface(s) Assessment (bed, cushion, seat, etc.) INTERVENTIONS - Wound Cleansing / Measurement X - Simple Wound Cleansing - one wound 1 5 []  - 0 Complex Wound Cleansing - multiple wounds X- 1  5 Wound Imaging (photographs - any number of wounds) []  - 0 Wound Tracing (instead of photographs) X- 1 5 Simple Wound Measurement - one wound []  - 0 Complex Wound Measurement - multiple wounds INTERVENTIONS - Wound Dressings []  - Small Wound Dressing one or multiple wounds 0 X- 1 15 Medium Wound Dressing one or multiple wounds []  - 0 Large Wound Dressing one or multiple wounds []  - 0 Application of Medications - topical []  - 0 Application of Medications - injection INTERVENTIONS - Miscellaneous []  - External ear exam 0 []  - 0 Specimen Collection (cultures, biopsies, blood, body fluids, etc.) []  - 0 Specimen(s) / Culture(s) sent or taken to Lab for analysis []  - 0 Patient Transfer (multiple staff / Civil Service fast streamer / Similar devices) []  - 0 Simple Staple / Suture removal (25 or less) []  - 0 Complex Staple / Suture removal (26 or more) []  - 0 Hypo / Hyperglycemic Management (close monitor of Blood Glucose) []  - 0 Ankle / Brachial Index (ABI) - do not check if billed separately X- 1 5 Vital Signs Merlos, Kaylon ([id number]) Has the patient been seen at the hospital within the last three years: Yes Total Score: 70 Level Of Care: New/Established - Level 2 Electronic Signature(s) Signed: 06/18/2018 5:17:02 PM By: Gretta Cool, BSN, RN, CWS, Kim RN, BSN Entered By: Gretta Cool, BSN, RN, CWS, Kim on 06/18/2018 12:50:06 [name] ([id number]) -------------------------------------------------------------------------------- Encounter Discharge Information Details Patient Name: [name] Date of Service: 06/18/2018 12:30 PM Medical Record Number: [id number] Patient Account Number: [id number] Date of Birth/Sex: 1928/01/03 (84 y.o. F) Treating RN: [name] Barman Primary Care Antoinne Spadaccini: Park Liter Other Clinician: Referring Tamula Morrical: Park Liter Treating Custer Pimenta/Extender: Tito Dine in Treatment: 66 Encounter Discharge Information Items Discharge Condition: Stable Ambulatory Status: Ambulatory Discharge Destination: Home Transportation: Private Auto Accompanied By: self Schedule Follow-up Appointment: Yes Clinical Summary of Care: Electronic Signature(s) Signed: 06/18/2018 5:17:02 PM By: Gretta Cool, BSN, RN, CWS, Kim RN, BSN Entered By: Gretta Cool, BSN, RN, CWS, Kim on 06/18/2018 12:52:04 [name] ([id number]) -------------------------------------------------------------------------------- Lower Extremity Assessment Details Patient Name: [name] Date of Service: 06/18/2018 12:30 PM Medical Record Number: [id number] Patient Account Number: [id number] Date of Birth/Sex: November 01, 1927 (84 y.o. F) Treating RN: Secundino Ginger Primary Care Sohrab Keelan: Park Liter Other Clinician: Referring Daisi Kentner: Park Liter Treating Brookie Wayment/Extender: Tito Dine in Treatment: 18 Electronic Signature(s) Signed: 06/18/2018 4:03:08 PM By: Secundino Ginger Entered By: Secundino Ginger on 06/18/2018 12:40:34 [name] ([id number]) -------------------------------------------------------------------------------- Multi Wound Chart Details Patient Name: [name] Date of Service: 06/18/2018 12:30 PM Medical Record Number: [id number] Patient Account Number: [id number] Date of Birth/Sex: [birth year] (84 y.o. F) Treating RN: [name] Barman Primary Care Altha Sweitzer: Park Liter Other Clinician: Referring Asier Desroches: Park Liter Treating Somnang Mahan/Extender: Tito Dine in Treatment: 18 Vital Signs Height(in): 60 Pulse(bpm): 14 Weight(lbs): 94.2 Blood Pressure(mmHg): 139/63 Body Mass Index(BMI): 18 Temperature(F): 97.6 Respiratory Rate 16 (breaths/min): Photos: [5:No Photos] [N/A:N/A] Wound Location:  [5:Left Upper Arm - Posterior] [N/A:N/A] Wounding Event: [5:Trauma] [N/A:N/A] Primary Etiology: [5:Trauma, Other] [N/A:N/A] Comorbid History: [5:Cataracts, Glaucoma, Asthma, N/A Chronic Obstructive Pulmonary Disease (COPD), Arrhythmia, Hypertension] Date Acquired: [5:05/22/2018] [N/A:N/A] Weeks of Treatment: [5:2] [N/A:N/A] Wound Status: [5:Open] [N/A:N/A] Measurements L x W x D [5:0.6x0.8x0.1] [N/A:N/A] (cm) Area (cm) : [5:0.377] [N/A:N/A] Volume (cm) : [5:0.038] [N/A:N/A] % Reduction in Area: [5:68.00%] [N/A:N/A] % Reduction in Volume: [5:67.80%] [N/A:N/A] Classification: [5:Partial Thickness] [N/A:N/A] Exudate Amount: [5:Small] [N/A:N/A] Exudate Type: [5:Serous] [N/A:N/A] Exudate Color: [5:amber] [N/A:N/A] Granulation Amount: [5:None Present (0%)] [N/A:N/A] Necrotic Amount: [5:Medium (  34-66%)] [N/A:N/A] Exposed Structures: [5:Fat Layer (Subcutaneous Tissue) Exposed: Yes] [N/A:N/A] Periwound Skin Texture: [5:No Abnormalities Noted] [N/A:N/A] Periwound Skin Moisture: [5:No Abnormalities Noted] [N/A:N/A] Periwound Skin Color: [5:No Abnormalities Noted] [N/A:N/A] Tenderness on Palpation: [5:No] [N/A:N/A] Wound Preparation: [5:Ulcer Cleansing: Rinsed/Irrigated with Saline] [N/A:N/A] Topical Anesthetic Applied: Other: lidocaine 4% Treatment Notes Revelo, Keniesha ([id number]) Wound #5 (Left, Posterior Upper Arm) 4. Dressing Applied: Prisma Ag 5. Secondary Dressing Applied Bordered Foam Dressing Notes netting Electronic Signature(s) Signed: 06/18/2018 4:50:13 PM By: Linton Ham MD Entered By: Linton Ham on 06/18/2018 12:54:30 [name] ([id number]) -------------------------------------------------------------------------------- Multi-Disciplinary Care Plan Details Patient Name: [name] Date of Service: 06/18/2018 12:30 PM Medical Record Number: [id number] Patient Account Number: [id number] Date of Birth/Sex: 08/24/1928 (84 y.o. F) Treating RN:  [name] Barman Primary Care Nataki Mccrumb: Park Liter Other Clinician: Referring Sherryl Valido: Park Liter Treating Meredith Kilbride/Extender: Tito Dine in Treatment: 22 Active Inactive Electronic Signature(s) Signed: 07/10/2018 5:57:49 PM By: Gretta Cool, BSN, RN, CWS, Kim RN, BSN Previous Signature: 06/18/2018 5:17:02 PM Version By: Gretta Cool, BSN, RN, CWS, Kim RN, BSN Entered By: Gretta Cool, BSN, RN, CWS, Kim on 07/10/2018 17:57:48 [name] ([id number]) -------------------------------------------------------------------------------- Pain Assessment Details Patient Name: [name] Date of Service: 06/18/2018 12:30 PM Medical Record Number: [id number] Patient Account Number: [id number] Date of Birth/Sex: 03/21/1928 (84 y.o. F) Treating RN: Secundino Ginger Primary Care Chloe Bluett: Park Liter Other Clinician: Referring Kaleo Condrey: Park Liter Treating Filip Luten/Extender: Tito Dine in Treatment: 18 Active Problems Location of Pain Severity and Description of Pain Patient Has Paino No Site Locations Pain Management and Medication Current Pain Management: Goals for Pain Management pt denies any pain at this time. Electronic Signature(s) Signed: 06/18/2018 4:03:08 PM By: Secundino Ginger Entered By: Secundino Ginger on 06/18/2018 12:40:02 [name] ([id number]) -------------------------------------------------------------------------------- Patient/Caregiver Education Details Patient Name: [name] Date of Service: 06/18/2018 12:30 PM Medical Record Number: [id number] Patient Account Number: [id number] Date of Birth/Gender: February 26, 1928 (84 y.o. F) Treating RN: [name] Barman Primary Care Physician: Park Liter Other Clinician: Referring Physician: Park Liter Treating Physician/Extender: Tito Dine in Treatment: 3 Education Assessment Education Provided To: Patient Education Topics Provided Wound/Skin Impairment: Handouts: Caring for Your  Ulcer Methods: Demonstration, Explain/Verbal Responses: State content correctly Electronic Signature(s) Signed: 06/18/2018 5:17:02 PM By: Gretta Cool, BSN, RN, CWS, Kim RN, BSN Entered By: Gretta Cool, BSN, RN, CWS, Kim on 06/18/2018 12:50:23 [name] ([id number]) -------------------------------------------------------------------------------- Wound Assessment Details Patient Name: [name] Date of Service: 06/18/2018 12:30 PM Medical Record Number: [id number] Patient Account Number: [id number] Date of Birth/Sex: 1928/05/25 (84 y.o. F) Treating RN: Secundino Ginger Primary Care Betzalel Umbarger: Park Liter Other Clinician: Referring Steven Basso: Park Liter Treating Christabel Camire/Extender: Tito Dine in Treatment: 18 Wound Status Wound Number: 5 Primary Trauma, Other Etiology: Wound Location: Left Upper Arm - Posterior Wound Open Wounding Event: Trauma Status: Date Acquired: 05/22/2018 Comorbid Cataracts, Glaucoma, Asthma, Chronic Weeks Of Treatment: 2 History: Obstructive Pulmonary Disease (COPD), Clustered Wound: No Arrhythmia, Hypertension Photos Photo Uploaded By: Secundino Ginger on 06/18/2018 13:00:52 Wound Measurements Length: (cm) 0.6 Width: (cm) 0.8 Depth: (cm) 0.1 Area: (cm) 0.377 Volume: (cm) 0.038 % Reduction in Area: 68% % Reduction in Volume: 67.8% Tunneling: No Undermining: No Wound Description Classification: Partial Thickness Exudate Amount: Small Exudate Type: Serous Exudate Color: amber Foul Odor After Cleansing: No Slough/Fibrino Yes Wound Bed Granulation Amount: None Present (0%) Exposed Structure Necrotic Amount: Medium (34-66%) Fat Layer (Subcutaneous Tissue) Exposed: Yes Necrotic Quality: Adherent Slough Periwound Skin Texture Texture Color No Abnormalities Noted: Yes No Abnormalities Noted: Yes Moisture No  Abnormalities Noted: No Urias, Mandi ([id number]) Wound Preparation Ulcer Cleansing: Rinsed/Irrigated with Saline Topical  Anesthetic Applied: Other: lidocaine 4%, Electronic Signature(s) Signed: 06/18/2018 4:03:08 PM By: Secundino Ginger Entered By: Secundino Ginger on 06/18/2018 12:44:44 [name] ([id number]) -------------------------------------------------------------------------------- Vitals Details Patient Name: [name] Date of Service: 06/18/2018 12:30 PM Medical Record Number: [id number] Patient Account Number: [id number] Date of Birth/Sex: 1927-12-05 (84 y.o. F) Treating RN: Secundino Ginger Primary Care Danialle Dement: Park Liter Other Clinician: Referring Kayliah Tindol: Park Liter Treating Elray Dains/Extender: Tito Dine in Treatment: 18 Vital Signs Time Taken: 12:40 Temperature (F): 97.6 Height (in): 60 Pulse (bpm): 77 Weight (lbs): 94.2 Respiratory Rate (breaths/min): 16 Body Mass Index (BMI): 18.4 Blood Pressure (mmHg): 139/63 Reference Range: 80 - 120 mg / dl Electronic Signature(s) Signed: 06/18/2018 4:03:08 PM By: Secundino Ginger Entered BySecundino Ginger on 06/18/2018 12:40:27

## 2018-06-20 NOTE — Progress Notes (Signed)
LAMERLE, JABS ([id number]) Visit Report for 06/18/2018 HPI Details Patient Name: [name], [name] Date of Service: 06/18/2018 12:30 PM Medical Record Number: [id number] Patient Account Number: [id number] Date of Birth/Sex: Jun 06, 1928 (84 y.o. F) Treating RN: [name] Barman Primary Care Provider: Park Liter Other Clinician: Referring Provider: Park Liter Treating Provider/Extender: Tito Dine in Treatment: 18 History of Present Illness HPI Description: 02/12/18 ADMISSION This is an 84 year old woman who is recently moved to [place] from [place]. Her story began in February where she fell on some ice suffering extensive lacerations to her bilateral lower extremities. She is able to show me extensive pictures of the right lower leg but with going to a wound care center and [place] 3 times a week these eventually closed over. She has been left with 1 area on the upper lateral left calf. She has been applying Neosporin to this and applying a bandage. Currently this measures 2 x 1.5 cm. The patient is not a diabetic. She is an ex-smoker quitting 40 years ago. She does have COPD by description. ABIs in our clinic were 1.32 on the right and 1.03 on the left 02/19/18; right lower leg wound which was initially trauma. The area that we look that last week is smaller. Still covered in a nonviable surface however. We are using Iodoflex 02/26/18; right lower leg wound which was initially trauma in the setting of chronic venous insufficiency. Surface of the wound looks much better healthy granulation advancing epithelialization. We have good edema control 03/05/18; right lower leg wound which was initially trauma in the setting of chronic venous insufficiency. She continues to make nice progress here. We have good edema control oHe arrives today with a new traumatic laceration on her right dorsal forearm which she states happened while she was putting on a sweatshirt 03/12/18; right  lower leg wound as closed however it still looks vulnerable. She has chronic venous insufficiency. oThe new wound from last week a traumatic area on her right dorsal or arm unfortunately does not have a viable surface. I remove some nonviable skin and necrotic subcutaneous debris from the wound surface. Hemostasis with silver nitrate and direct pressure 03/19/18; right lower leg wound is closed. She has chronic venous insufficiency and will need ongoing compression oThe new wounds from 2 weeks ago on the right upper elbow is closed. The area on her right dorsal forearm continues to have a nonviable surface requiring debridement 03/26/18; we'll need to look into ongoing compression for her legs. 2 wounds on the right upper elbow is closed. The area on the right dorsal forearm looks better. Hydrofera Blue secondary to hypertrophic granulation 04/02/18; he has compression stockings although she did not wear them today. Severe chronic venous insufficiency. She has 2 wounds on the right upper elbow which I said were closed last week which actually are although they're very small and she has a smaller clean wound on the right dorsal forearm. We've been using Hydrofera Blue secondary to some upper granulation. All of this looks better 04/09/18-She is seen in follow up evaluation for a right elbow and right forearm skin tear. The right elbow is healed. We will continue with same treatment plan and she'll follow next week 04/16/18-She is seen in follow-up evaluation for right forearm skin tear, this is essentially healed. We will cover with foam border and she will follow-up next week 04/23/18; the patient arrives with a new skin tear on her right dorsal arm just below the elbow. She got this while carrying a  box. She has a linear skin tear. There is no depth I don't think this should've been sutured. There is a skin flap medially I'm not sure if this will be viable or not 04/30/18; the new skin tear from last  week unfortunately has remained viable in terms of the skin flap. She has a small open area that looks healthy. Using moistened silver collagen 05/07/18; right dorsal arm skin tear. Nonviable tissue over the remnants of the wound. We've been using silver collagen 05/21/18; right dorsal arm skin tear. Nonviable tissue over a small remnant of the wound was washed off. We've been using silver collagen which we will continue 06/04/18; the right dorsal arm skin tear has healed. She arrives today with a traumatic wound just above the olecranon of her Wise, Carol ([id number]) left elbow. This is a skin tear with a nonviable nonadherent flap which was removed. We'll use silver collagen here. Also noteworthy she arrives without her compression stockings 06/18/18; the area just above the olecranon of her left elbow. It is smaller and generally has a healthy surface. I was surprised to learn that the patient is actually changing this herself although. Appears this week she was putting some topical lidocaine on this that she bought over-the-counter at the drugstore. She did get supplies at home she didn't know how to put it on. She drove herself here today i.e. not eligible for home health Electronic Signature(s) Signed: 06/18/2018 4:50:13 PM By: Linton Ham MD Entered By: Linton Ham on 06/18/2018 12:56:08 [name], [name] ([id number]) -------------------------------------------------------------------------------- Physical Exam Details Patient Name: [name] Date of Service: 06/18/2018 12:30 PM Medical Record Number: [id number] Patient Account Number: [id number] Date of Birth/Sex: Oct 12, 1927 (84 y.o. F) Treating RN: [name] Barman Primary Care Provider: Park Liter Other Clinician: Referring Provider: Park Liter Treating Provider/Extender: Tito Dine in Treatment: 18 Constitutional Sitting or standing Blood Pressure is within target range for patient.. Pulse  regular and within target range for patient.Marland Kitchen Respirations regular, non-labored and within target range.. Temperature is normal and within the target range for the patient.Marland Kitchen appears in no distress. Eyes Conjunctivae clear. No discharge. Respiratory Respiratory effort is easy and symmetric bilaterally. Rate is normal at rest and on room air.. Cardiovascular Radial and brachial pulses and left arm are normal.. Lymphatic None palpable in the left F [place] clear area. Integumentary (Hair, Skin) There is no primary skin issue.Marland Kitchen Psychiatric No evidence of depression, anxiety, or agitation. Calm, cooperative, and communicative. Appropriate interactions and affect.. Notes When exam; the area on the left upper arm just above the olecranon process. Very tiny healthy-looking area I think this is made some progress. Not totally clear what the patient was putting on it this week but probably not the collagen was intended Electronic Signature(s) Signed: 06/18/2018 4:50:13 PM By: Linton Ham MD Entered By: Linton Ham on 06/18/2018 12:58:31 [name] ([id number]) -------------------------------------------------------------------------------- Physician Orders Details Patient Name: [name] Date of Service: 06/18/2018 12:30 PM Medical Record Number: [id number] Patient Account Number: [id number] Date of Birth/Sex: Jun 28, 1928 (84 y.o. F) Treating RN: [name] Barman Primary Care Provider: Park Liter Other Clinician: Referring Provider: Park Liter Treating Provider/Extender: Tito Dine in Treatment: 48 Verbal / Phone Orders: No Diagnosis Coding Wound Cleansing Wound #5 Left,Posterior Upper Arm o Clean wound with Normal Saline. Anesthetic (add to Medication List) Wound #5 Left,Posterior Upper Arm o Topical Lidocaine 4% cream applied to wound bed prior to debridement (In Clinic Only). Primary Wound Dressing Wound #5 Left,Posterior Upper Arm o  Silver  Collagen - moisten with saline Secondary Dressing Wound #5 Left,Posterior Upper Arm o Conform/Kerlix - secured with stretchnet Dressing Change Frequency Wound #5 Left,Posterior Upper Arm o Three times weekly Follow-up Appointments Wound #5 Left,Posterior Upper Arm o Return Appointment in 2 weeks. Electronic Signature(s) Signed: 06/18/2018 4:50:13 PM By: Linton Ham MD Signed: 06/18/2018 5:17:02 PM By: Gretta Cool BSN, RN, CWS, Kim RN, BSN Entered By: Gretta Cool, BSN, RN, CWS, Kim on 06/18/2018 12:49:39 [name], [name] ([id number]) -------------------------------------------------------------------------------- Problem List Details Patient Name: [name] Date of Service: 06/18/2018 12:30 PM Medical Record Number: [id number] Patient Account Number: [id number] Date of Birth/Sex: 10/29/1927 (84 y.o. F) Treating RN: [name] Barman Primary Care Provider: Park Liter Other Clinician: Referring Provider: Park Liter Treating Provider/Extender: Tito Dine in Treatment: 18 Active Problems ICD-10 Evaluated Encounter Code Description Active Date Today Diagnosis S41.112D Laceration without foreign body of left upper arm, subsequent 06/04/2018 No Yes encounter Inactive Problems Resolved Problems ICD-10 Code Description Active Date Resolved Date L97.222 Non-pressure chronic ulcer of left calf with fat layer exposed 02/12/2018 02/12/2018 I87.311 Chronic venous hypertension (idiopathic) with ulcer of right lower 02/12/2018 02/12/2018 extremity S81.812D Laceration without foreign body, left lower leg, subsequent 02/12/2018 02/12/2018 encounter S41.111D Laceration without foreign body of right upper arm, subsequent 03/05/2018 03/05/2018 encounter Electronic Signature(s) Signed: 06/18/2018 4:50:13 PM By: Linton Ham MD Entered By: Linton Ham on 06/18/2018 12:54:17 [name]  ([id number]) -------------------------------------------------------------------------------- Progress Note Details Patient Name: [name] Date of Service: 06/18/2018 12:30 PM Medical Record Number: [id number] Patient Account Number: [id number] Date of Birth/Sex: November 11, 1927 (84 y.o. F) Treating RN: [name] Barman Primary Care Provider: Park Liter Other Clinician: Referring Provider: Park Liter Treating Provider/Extender: Tito Dine in Treatment: 18 Subjective History of Present Illness (HPI) 02/12/18 ADMISSION This is an 84 year old woman who is recently moved to [place] from [place]. Her story began in February where she fell on some ice suffering extensive lacerations to her bilateral lower extremities. She is able to show me extensive pictures of the right lower leg but with going to a wound care center and [place] 3 times a week these eventually closed over. She has been left with 1 area on the upper lateral left calf. She has been applying Neosporin to this and applying a bandage. Currently this measures 2 x 1.5 cm. The patient is not a diabetic. She is an ex-smoker quitting 40 years ago. She does have COPD by description. ABIs in our clinic were 1.32 on the right and 1.03 on the left 02/19/18; right lower leg wound which was initially trauma. The area that we look that last week is smaller. Still covered in a nonviable surface however. We are using Iodoflex 02/26/18; right lower leg wound which was initially trauma in the setting of chronic venous insufficiency. Surface of the wound looks much better healthy granulation advancing epithelialization. We have good edema control 03/05/18; right lower leg wound which was initially trauma in the setting of chronic venous insufficiency. She continues to make nice progress here. We have good edema control He arrives today with a new traumatic laceration on her right dorsal forearm which she states happened while she  was putting on a sweatshirt 03/12/18; right lower leg wound as closed however it still looks vulnerable. She has chronic venous insufficiency. The new wound from last week a traumatic area on her right dorsal or arm unfortunately does not have a viable surface. I remove some nonviable skin and necrotic subcutaneous debris from the wound surface. Hemostasis with silver nitrate  and direct pressure 03/19/18; right lower leg wound is closed. She has chronic venous insufficiency and will need ongoing compression The new wounds from 2 weeks ago on the right upper elbow is closed. The area on her right dorsal forearm continues to have a nonviable surface requiring debridement 03/26/18; we'll need to look into ongoing compression for her legs. 2 wounds on the right upper elbow is closed. The area on the right dorsal forearm looks better. Hydrofera Blue secondary to hypertrophic granulation 04/02/18; he has compression stockings although she did not wear them today. Severe chronic venous insufficiency. She has 2 wounds on the right upper elbow which I said were closed last week which actually are although they're very small and she has a smaller clean wound on the right dorsal forearm. We've been using Hydrofera Blue secondary to some upper granulation. All of this looks better 04/09/18-She is seen in follow up evaluation for a right elbow and right forearm skin tear. The right elbow is healed. We will continue with same treatment plan and she'll follow next week 04/16/18-She is seen in follow-up evaluation for right forearm skin tear, this is essentially healed. We will cover with foam border and she will follow-up next week 04/23/18; the patient arrives with a new skin tear on her right dorsal arm just below the elbow. She got this while carrying a box. She has a linear skin tear. There is no depth I don't think this should've been sutured. There is a skin flap medially I'm not sure if this will be viable or  not 04/30/18; the new skin tear from last week unfortunately has remained viable in terms of the skin flap. She has a small open area that looks healthy. Using moistened silver collagen 05/07/18; right dorsal arm skin tear. Nonviable tissue over the remnants of the wound. We've been using silver collagen 05/21/18; right dorsal arm skin tear. Nonviable tissue over a small remnant of the wound was washed off. We've been using silver collagen which we will continue 06/04/18; the right dorsal arm skin tear has healed. She arrives today with a traumatic wound just above the olecranon of her left elbow. This is a skin tear with a nonviable nonadherent flap which was removed. We'll use silver collagen here. Also noteworthy she arrives without her compression stockings [name], [name] ([id number]) 06/18/18; the area just above the olecranon of her left elbow. It is smaller and generally has a healthy surface. I was surprised to learn that the patient is actually changing this herself although. Appears this week she was putting some topical lidocaine on this that she bought over-the-counter at the drugstore. She did get supplies at home she didn't know how to put it on. She drove herself here today i.e. not eligible for home health Objective Constitutional Sitting or standing Blood Pressure is within target range for patient.. Pulse regular and within target range for patient.Marland Kitchen Respirations regular, non-labored and within target range.. Temperature is normal and within the target range for the patient.Marland Kitchen appears in no distress. Vitals Time Taken: 12:40 PM, Height: 60 in, Weight: 94.2 lbs, BMI: 18.4, Temperature: 97.6 F, Pulse: 77 bpm, Respiratory Rate: 16 breaths/min, Blood Pressure: 139/63 mmHg. Eyes Conjunctivae clear. No discharge. Respiratory Respiratory effort is easy and symmetric bilaterally. Rate is normal at rest and on room air.. Cardiovascular Radial and brachial pulses and left arm are  normal.. Lymphatic None palpable in the left F [place] clear area. Psychiatric No evidence of depression, anxiety, or agitation. Calm, cooperative,  and communicative. Appropriate interactions and affect.. General Notes: When exam; the area on the left upper arm just above the olecranon process. Very tiny healthy-looking area I think this is made some progress. Not totally clear what the patient was putting on it this week but probably not the collagen was intended Integumentary (Hair, Skin) There is no primary skin issue.. Wound #5 status is Open. Original cause of wound was Trauma. The wound is located on the Left,Posterior Upper Arm. The wound measures 0.6cm length x 0.8cm width x 0.1cm depth; 0.377cm^2 area and 0.038cm^3 volume. There is Fat Layer (Subcutaneous Tissue) Exposed exposed. There is no tunneling or undermining noted. There is a small amount of serous drainage noted. There is no granulation within the wound bed. There is a medium (34-66%) amount of necrotic tissue within the wound bed including Adherent Slough. The periwound skin appearance had no abnormalities noted for texture. The periwound skin appearance had no abnormalities noted for color. [name], [name] ([id number]) Assessment Active Problems ICD-10 Laceration without foreign body of left upper arm, subsequent encounter Plan Wound Cleansing: Wound #5 Left,Posterior Upper Arm: Clean wound with Normal Saline. Anesthetic (add to Medication List): Wound #5 Left,Posterior Upper Arm: Topical Lidocaine 4% cream applied to wound bed prior to debridement (In Clinic Only). Primary Wound Dressing: Wound #5 Left,Posterior Upper Arm: Silver Collagen - moisten with saline Secondary Dressing: Wound #5 Left,Posterior Upper Arm: Conform/Kerlix - secured with stretchnet Dressing Change Frequency: Wound #5 Left,Posterior Upper Arm: Three times weekly Follow-up Appointments: Wound #5 Left,Posterior Upper Arm: Return  Appointment in 2 weeks. #1 I'd like to continue with the collagen/Kerlix/conform #2 to our staff went over how to put this on. Had some concerns that she will be able to do this independently will see how this looks when she comes in next week Electronic Signature(s) Signed: 06/18/2018 4:50:13 PM By: Linton Ham MD Entered By: Linton Ham on 06/18/2018 13:00:13 [name] ([id number]) -------------------------------------------------------------------------------- SuperBill Details Patient Name: [name] Date of Service: 06/18/2018 Medical Record Number: [id number] Patient Account Number: [id number] Date of Birth/Sex: 11-05-1927 (84 y.o. F) Treating RN: [name] Barman Primary Care Provider: Park Liter Other Clinician: Referring Provider: Park Liter Treating Provider/Extender: Tito Dine in Treatment: 18 Diagnosis Coding ICD-10 Codes Code Description S41.112D Laceration without foreign body of left upper arm, subsequent encounter Facility Procedures CPT4 Code: [id number] Description: [phone] - WOUND CARE VISIT-LEV 2 EST PT Modifier: Quantity: 1 Physician Procedures CPT4 Code: [id number] Description: 16606 - WC PHYS LEVEL 3 - EST PT ICD-10 Diagnosis Description S41.112D Laceration without foreign body of left upper arm, subsequ Modifier: ent encounter Quantity: 1 Electronic Signature(s) Signed: 06/18/2018 4:50:13 PM By: Linton Ham MD Entered By: Linton Ham on 06/18/2018 13:00:41

## 2018-06-22 ENCOUNTER — Encounter: Payer: Self-pay | Admitting: Family Medicine

## 2018-06-22 ENCOUNTER — Emergency Department: Payer: Medicare Other

## 2018-06-22 ENCOUNTER — Encounter: Payer: Self-pay | Admitting: Emergency Medicine

## 2018-06-22 ENCOUNTER — Inpatient Hospital Stay
Admission: EM | Admit: 2018-06-22 | Discharge: 2018-07-01 | DRG: 330 | Disposition: A | Discharge status: Skilled Nursing Facility | Payer: Medicare Other | Attending: Surgery | Admitting: Surgery

## 2018-06-22 ENCOUNTER — Other Ambulatory Visit: Payer: Self-pay

## 2018-06-22 DIAGNOSIS — K5730 Diverticulosis of large intestine without perforation or abscess without bleeding: Secondary | ICD-10-CM | POA: Diagnosis present

## 2018-06-22 DIAGNOSIS — Z9981 Dependence on supplemental oxygen: Secondary | ICD-10-CM | POA: Diagnosis not present

## 2018-06-22 DIAGNOSIS — Z7982 Long term (current) use of aspirin: Secondary | ICD-10-CM

## 2018-06-22 DIAGNOSIS — J449 Chronic obstructive pulmonary disease, unspecified: Secondary | ICD-10-CM | POA: Diagnosis not present

## 2018-06-22 DIAGNOSIS — D72829 Elevated white blood cell count, unspecified: Secondary | ICD-10-CM | POA: Diagnosis not present

## 2018-06-22 DIAGNOSIS — G8918 Other acute postprocedural pain: Secondary | ICD-10-CM | POA: Diagnosis not present

## 2018-06-22 DIAGNOSIS — Z48815 Encounter for surgical aftercare following surgery on the digestive system: Secondary | ICD-10-CM | POA: Diagnosis not present

## 2018-06-22 DIAGNOSIS — K625 Hemorrhage of anus and rectum: Secondary | ICD-10-CM | POA: Diagnosis not present

## 2018-06-22 DIAGNOSIS — I774 Celiac artery compression syndrome: Secondary | ICD-10-CM | POA: Diagnosis not present

## 2018-06-22 DIAGNOSIS — Z7951 Long term (current) use of inhaled steroids: Secondary | ICD-10-CM | POA: Diagnosis not present

## 2018-06-22 DIAGNOSIS — K921 Melena: Secondary | ICD-10-CM | POA: Diagnosis present

## 2018-06-22 DIAGNOSIS — I480 Paroxysmal atrial fibrillation: Secondary | ICD-10-CM | POA: Diagnosis not present

## 2018-06-22 DIAGNOSIS — K56699 Other intestinal obstruction unspecified as to partial versus complete obstruction: Secondary | ICD-10-CM | POA: Diagnosis not present

## 2018-06-22 DIAGNOSIS — Z681 Body mass index (BMI) 19 or less, adult: Secondary | ICD-10-CM

## 2018-06-22 DIAGNOSIS — I739 Peripheral vascular disease, unspecified: Secondary | ICD-10-CM | POA: Diagnosis not present

## 2018-06-22 DIAGNOSIS — R935 Abnormal findings on diagnostic imaging of other abdominal regions, including retroperitoneum: Secondary | ICD-10-CM | POA: Diagnosis not present

## 2018-06-22 DIAGNOSIS — E440 Moderate protein-calorie malnutrition: Secondary | ICD-10-CM | POA: Diagnosis not present

## 2018-06-22 DIAGNOSIS — D692 Other nonthrombocytopenic purpura: Secondary | ICD-10-CM | POA: Diagnosis not present

## 2018-06-22 DIAGNOSIS — Z9049 Acquired absence of other specified parts of digestive tract: Secondary | ICD-10-CM | POA: Diagnosis not present

## 2018-06-22 DIAGNOSIS — Z79890 Hormone replacement therapy (postmenopausal): Secondary | ICD-10-CM | POA: Diagnosis not present

## 2018-06-22 DIAGNOSIS — D500 Iron deficiency anemia secondary to blood loss (chronic): Secondary | ICD-10-CM | POA: Diagnosis not present

## 2018-06-22 DIAGNOSIS — Z7401 Bed confinement status: Secondary | ICD-10-CM | POA: Diagnosis not present

## 2018-06-22 DIAGNOSIS — N3281 Overactive bladder: Secondary | ICD-10-CM | POA: Diagnosis present

## 2018-06-22 DIAGNOSIS — I2510 Atherosclerotic heart disease of native coronary artery without angina pectoris: Secondary | ICD-10-CM | POA: Diagnosis not present

## 2018-06-22 DIAGNOSIS — E039 Hypothyroidism, unspecified: Secondary | ICD-10-CM | POA: Diagnosis not present

## 2018-06-22 DIAGNOSIS — F419 Anxiety disorder, unspecified: Secondary | ICD-10-CM | POA: Diagnosis present

## 2018-06-22 DIAGNOSIS — I4891 Unspecified atrial fibrillation: Secondary | ICD-10-CM | POA: Diagnosis present

## 2018-06-22 DIAGNOSIS — C182 Malignant neoplasm of ascending colon: Secondary | ICD-10-CM | POA: Diagnosis not present

## 2018-06-22 DIAGNOSIS — C189 Malignant neoplasm of colon, unspecified: Secondary | ICD-10-CM | POA: Diagnosis not present

## 2018-06-22 DIAGNOSIS — C183 Malignant neoplasm of hepatic flexure: Secondary | ICD-10-CM | POA: Diagnosis not present

## 2018-06-22 DIAGNOSIS — I440 Atrioventricular block, first degree: Secondary | ICD-10-CM | POA: Diagnosis not present

## 2018-06-22 DIAGNOSIS — R933 Abnormal findings on diagnostic imaging of other parts of digestive tract: Secondary | ICD-10-CM | POA: Diagnosis not present

## 2018-06-22 DIAGNOSIS — Z86718 Personal history of other venous thrombosis and embolism: Secondary | ICD-10-CM

## 2018-06-22 DIAGNOSIS — K922 Gastrointestinal hemorrhage, unspecified: Secondary | ICD-10-CM

## 2018-06-22 DIAGNOSIS — K6389 Other specified diseases of intestine: Secondary | ICD-10-CM | POA: Diagnosis not present

## 2018-06-22 DIAGNOSIS — Z79899 Other long term (current) drug therapy: Secondary | ICD-10-CM

## 2018-06-22 DIAGNOSIS — I10 Essential (primary) hypertension: Secondary | ICD-10-CM | POA: Diagnosis not present

## 2018-06-22 DIAGNOSIS — I471 Supraventricular tachycardia: Secondary | ICD-10-CM | POA: Diagnosis not present

## 2018-06-22 DIAGNOSIS — M6281 Muscle weakness (generalized): Secondary | ICD-10-CM | POA: Diagnosis not present

## 2018-06-22 DIAGNOSIS — Z87891 Personal history of nicotine dependence: Secondary | ICD-10-CM

## 2018-06-22 DIAGNOSIS — I701 Atherosclerosis of renal artery: Secondary | ICD-10-CM | POA: Diagnosis present

## 2018-06-22 DIAGNOSIS — D509 Iron deficiency anemia, unspecified: Secondary | ICD-10-CM | POA: Diagnosis not present

## 2018-06-22 DIAGNOSIS — Z90710 Acquired absence of both cervix and uterus: Secondary | ICD-10-CM

## 2018-06-22 DIAGNOSIS — E038 Other specified hypothyroidism: Secondary | ICD-10-CM | POA: Diagnosis not present

## 2018-06-22 HISTORY — DX: Melena: K92.1

## 2018-06-22 LAB — COMPREHENSIVE METABOLIC PANEL
ALT: 10 U/L (ref 0–44)
AST: 21 U/L (ref 15–41)
Albumin: 3.7 g/dL (ref 3.5–5.0)
Alkaline Phosphatase: 94 U/L (ref 38–126)
Anion gap: 7 (ref 5–15)
BUN: 18 mg/dL (ref 8–23)
CO2: 27 mmol/L (ref 22–32)
Calcium: 8.8 mg/dL — ABNORMAL LOW (ref 8.9–10.3)
Chloride: 105 mmol/L (ref 98–111)
Creatinine, Ser: 1.29 mg/dL — ABNORMAL HIGH (ref 0.44–1.00)
GFR calc Af Amer: 42 mL/min — ABNORMAL LOW (ref >60)
GFR calc non Af Amer: 36 mL/min — ABNORMAL LOW (ref >60)
Glucose, Bld: 111 mg/dL — ABNORMAL HIGH (ref 70–99)
Potassium: 4.1 mmol/L (ref 3.5–5.1)
Sodium: 139 mmol/L (ref 135–145)
Total Bilirubin: 0.4 mg/dL (ref 0.3–1.2)
Total Protein: 6.5 g/dL (ref 6.5–8.1)

## 2018-06-22 LAB — CBC WITH DIFFERENTIAL/PLATELET
Abs Immature Granulocytes: 0.04 10*3/uL (ref 0.00–0.07)
Basophils Absolute: 0.1 10*3/uL (ref 0.0–0.1)
Basophils Relative: 1 %
Eosinophils Absolute: 0.1 10*3/uL (ref 0.0–0.5)
Eosinophils Relative: 2 %
HCT: 37.7 % (ref 36.0–46.0)
Hemoglobin: 11.9 g/dL — ABNORMAL LOW (ref 12.0–15.0)
Immature Granulocytes: 1 %
Lymphocytes Relative: 25 %
Lymphs Abs: 2 10*3/uL (ref 0.7–4.0)
MCH: 27.4 pg (ref 26.0–34.0)
MCHC: 31.6 g/dL (ref 30.0–36.0)
MCV: 86.9 fL (ref 80.0–100.0)
Monocytes Absolute: 0.7 10*3/uL (ref 0.1–1.0)
Monocytes Relative: 10 %
Neutro Abs: 4.8 10*3/uL (ref 1.7–7.7)
Neutrophils Relative %: 61 %
Platelets: 244 10*3/uL (ref 150–400)
RBC: 4.34 MIL/uL (ref 3.87–5.11)
RDW: 13.5 % (ref 11.5–15.5)
WBC: 7.7 10*3/uL (ref 4.0–10.5)
nRBC: 0 % (ref 0.0–0.2)

## 2018-06-22 LAB — TYPE AND SCREEN
ABO/RH(D): A NEG
Antibody Screen: NEGATIVE

## 2018-06-22 LAB — LACTIC ACID, PLASMA: Lactic Acid, Venous: 1.1 mmol/L (ref 0.5–1.9)

## 2018-06-22 LAB — HEMOGLOBIN: Hemoglobin: 12.3 g/dL (ref 12.0–15.0)

## 2018-06-22 MED ORDER — PANTOPRAZOLE SODIUM 40 MG IV SOLR
40.0000 mg | Freq: Two times a day (BID) | INTRAVENOUS | Status: DC
  Administered 2018-06-22 – 2018-06-27 (×10): 40 mg via INTRAVENOUS
  Filled 2018-06-22 (×10): qty 40

## 2018-06-22 MED ORDER — METOPROLOL TARTRATE 50 MG PO TABS
50.0000 mg | ORAL_TABLET | Freq: Two times a day (BID) | ORAL | Status: DC
  Administered 2018-06-22 – 2018-07-01 (×19): 50 mg via ORAL
  Filled 2018-06-22 (×19): qty 1

## 2018-06-22 MED ORDER — FLUTICASONE FUROATE-VILANTEROL 200-25 MCG/INH IN AEPB
1.0000 | INHALATION_SPRAY | Freq: Every day | RESPIRATORY_TRACT | Status: DC
  Administered 2018-06-22 – 2018-07-01 (×10): 1 via RESPIRATORY_TRACT
  Filled 2018-06-22 (×3): qty 28

## 2018-06-22 MED ORDER — PANTOPRAZOLE SODIUM 40 MG IV SOLR
40.0000 mg | Freq: Once | INTRAVENOUS | Status: AC
  Administered 2018-06-22: 40 mg via INTRAVENOUS
  Filled 2018-06-22: qty 40

## 2018-06-22 MED ORDER — ACETAMINOPHEN 325 MG PO TABS: 650.0000 mg | ORAL_TABLET | Freq: Four times a day (QID) | ORAL | Status: DC | PRN

## 2018-06-22 MED ORDER — ONDANSETRON HCL 4 MG PO TABS
4.0000 mg | ORAL_TABLET | Freq: Four times a day (QID) | ORAL | Status: DC | PRN
  Administered 2018-06-27: 4 mg via ORAL
  Filled 2018-06-22: qty 1

## 2018-06-22 MED ORDER — BISACODYL 5 MG PO TBEC
5.0000 mg | DELAYED_RELEASE_TABLET | Freq: Once | ORAL | Status: AC
  Administered 2018-06-22: 12:00:00 5 mg via ORAL
  Filled 2018-06-22: qty 1

## 2018-06-22 MED ORDER — ACETAMINOPHEN 650 MG RE SUPP: 650.0000 mg | Freq: Four times a day (QID) | RECTAL | Status: DC | PRN

## 2018-06-22 MED ORDER — ASPIRIN EC 81 MG PO TBEC: 81.0000 mg | DELAYED_RELEASE_TABLET | Freq: Every day | ORAL | Status: DC

## 2018-06-22 MED ORDER — ONDANSETRON HCL 4 MG/2ML IJ SOLN
4.0000 mg | Freq: Four times a day (QID) | INTRAMUSCULAR | Status: DC | PRN
  Administered 2018-06-25: 18:00:00 4 mg via INTRAVENOUS
  Filled 2018-06-22: qty 2

## 2018-06-22 MED ORDER — DOCUSATE SODIUM 100 MG PO CAPS
100.0000 mg | ORAL_CAPSULE | Freq: Two times a day (BID) | ORAL | Status: DC
  Administered 2018-06-22 – 2018-06-25 (×8): 100 mg via ORAL
  Filled 2018-06-22 (×8): qty 1

## 2018-06-22 MED ORDER — IOPAMIDOL (ISOVUE-370) INJECTION 76%
75.0000 mL | Freq: Once | INTRAVENOUS | Status: AC | PRN
  Administered 2018-06-22: 100 mL via INTRAVENOUS

## 2018-06-22 MED ORDER — LEVOTHYROXINE SODIUM 100 MCG PO TABS
100.0000 ug | ORAL_TABLET | Freq: Every day | ORAL | Status: DC
  Administered 2018-06-24 – 2018-07-01 (×7): 100 ug via ORAL
  Filled 2018-06-22 (×7): qty 1

## 2018-06-22 MED ORDER — AMLODIPINE BESYLATE 5 MG PO TABS
2.5000 mg | ORAL_TABLET | Freq: Every day | ORAL | Status: DC
Start: 2018-06-22 — End: 2018-06-29
  Administered 2018-06-22 – 2018-06-28 (×6): 2.5 mg via ORAL
  Filled 2018-06-22 (×7): qty 1

## 2018-06-22 MED ORDER — METOPROLOL TARTRATE 25 MG PO TABS: 25.0000 mg | ORAL_TABLET | Freq: Two times a day (BID) | ORAL | Status: DC

## 2018-06-22 MED ORDER — SODIUM CHLORIDE 0.9 % IV SOLN
INTRAVENOUS | Status: DC
  Administered 2018-06-22 – 2018-06-24 (×4): via INTRAVENOUS

## 2018-06-22 MED ORDER — FERROUS SULFATE 325 (65 FE) MG PO TABS
325.0000 mg | ORAL_TABLET | Freq: Three times a day (TID) | ORAL | Status: DC
  Administered 2018-06-22 – 2018-06-25 (×7): 325 mg via ORAL
  Filled 2018-06-22 (×7): qty 1

## 2018-06-22 MED ORDER — PEG 3350-KCL-NA BICARB-NACL 420 G PO SOLR
4000.0000 mL | Freq: Once | ORAL | Status: AC
  Administered 2018-06-22: 4000 mL via ORAL
  Filled 2018-06-22 (×2): qty 4000

## 2018-06-22 NOTE — Progress Notes (Signed)
Advanced care plan.  Purpose of the Encounter: CODE STATUS  Parties in Attendance: Patient herself  Patient's Decision Capacity: Intact  Subjective/Patient's story:  Patient is 84 year old with history of hypothyroidism, overactive bladder, no depression, COPD, atrial fibrillation and anxiety presenting with melena  Objective/Medical story I discussed with the patient regarding her desires for cardiac and pulmonary resuscitation.   Goals of care determination: Patient states that she would like to have everything done if possible.  Unless is futile.    CODE STATUS:  Full code  Time spent discussing advanced care planning: 16 minutes

## 2018-06-22 NOTE — Plan of Care (Signed)
  Problem: Spiritual Needs Goal: Ability to function at adequate level Outcome: Progressing   Problem: Education: Goal: Knowledge of General Education information will improve Description Including pain rating scale, medication(s)/side effects and non-pharmacologic comfort measures Outcome: Progressing   Problem: Health Behavior/Discharge Planning: Goal: Ability to manage health-related needs will improve Outcome: Progressing   Problem: Clinical Measurements: Goal: Ability to maintain clinical measurements within normal limits will improve Outcome: Progressing Goal: Will remain free from infection Outcome: Progressing Goal: Diagnostic test results will improve Outcome: Progressing Goal: Respiratory complications will improve Outcome: Progressing Goal: Cardiovascular complication will be avoided Outcome: Progressing   Problem: Activity: Goal: Risk for activity intolerance will decrease Outcome: Progressing   Problem: Nutrition: Goal: Adequate nutrition will be maintained Outcome: Progressing   Problem: Coping: Goal: Level of anxiety will decrease Outcome: Progressing   Problem: Elimination: Goal: Will not experience complications related to bowel motility Outcome: Progressing Goal: Will not experience complications related to urinary retention Outcome: Progressing   Problem: Pain Managment: Goal: General experience of comfort will improve Outcome: Progressing   Problem: Safety: Goal: Ability to remain free from injury will improve Outcome: Progressing   Problem: Skin Integrity: Goal: Risk for impaired skin integrity will decrease Outcome: Progressing   Problem: Education: Goal: Ability to identify signs and symptoms of gastrointestinal bleeding will improve Outcome: Progressing   Problem: Bowel/Gastric: Goal: Will show no signs and symptoms of gastrointestinal bleeding Outcome: Progressing   Problem: Fluid Volume: Goal: Will show no signs and symptoms  of excessive bleeding Outcome: Progressing   Problem: Clinical Measurements: Goal: Complications related to the disease process, condition or treatment will be avoided or minimized Outcome: Progressing   

## 2018-06-22 NOTE — Progress Notes (Signed)
Patient has had several loose bowel movements, black in color. Patient has finished 3/4 of the bowel prep, patient states she cannot finish the container. Reported to [place].

## 2018-06-22 NOTE — Assessment & Plan Note (Signed)
Not under good control today. Will continue current regimen and continue to monitor closely.

## 2018-06-22 NOTE — H&P (Addendum)
[name] is an 84 y.o. female.   Chief Complaint: Dark stools HPI: Patient with past medical history of atrial fibrillation, COPD, hypothyroidism and history of intestinal perforation presents the emergency department complaining of dark tarry stools.  She had an episode of nonpainful melena approximately 5 hours prior to admission.  The patient continues to deny abdominal pain or nausea.  She was Hemoccult positive.  CT of her abdomen revealed a mass in the hepatic flexure.  Hemoglobin and vitals were stable but due to the patient's general weakness and lack of help at home the emergency department staff asked the hospitalist service to admit for preparation for colonoscopy.  Past Medical History:  Diagnosis Date  . Anxiety   . Atrial fibrillation ([place])   . Clotting disorder (HCC)    left ankle  . COPD (chronic obstructive pulmonary disease) ([place])   . Depression   . Overactive bladder   . Thyroid disease     Past Surgical History:  Procedure Laterality Date  . ABDOMINAL HYSTERECTOMY    . BREAST LUMPECTOMY      Family History  Problem Relation Age of Onset  . Heart disease Mother   . Heart failure Mother   . Parkinson's disease Father   . Heart disease Sister   . Heart disease Son    Social History:  reports that she quit smoking about 32 years ago. Her smoking use included cigarettes. She quit after 25.00 years of use. She has never used smokeless tobacco. She reports that she does not drink alcohol or use drugs.  Allergies: No Known Allergies   (Not in a hospital admission)  Results for orders placed or performed during the hospital encounter of 06/22/18 (from the past 48 hour(s))  Comprehensive metabolic panel     Status: Abnormal   Collection Time: 06/22/18  4:24 AM  Result Value Ref Range   Sodium 139 135 - 145 mmol/L   Potassium 4.1 3.5 - 5.1 mmol/L   Chloride 105 98 - 111 mmol/L   CO2 27 22 - 32 mmol/L   Glucose, Bld 111 (H) 70 - 99 mg/dL   BUN 18 8 - 23  mg/dL   Creatinine, Ser 1.29 (H) 0.44 - 1.00 mg/dL   Calcium 8.8 (L) 8.9 - 10.3 mg/dL   Total Protein 6.5 6.5 - 8.1 g/dL   Albumin 3.7 3.5 - 5.0 g/dL   AST 21 15 - 41 U/L   ALT 10 0 - 44 U/L   Alkaline Phosphatase 94 38 - 126 U/L   Total Bilirubin 0.4 0.3 - 1.2 mg/dL   GFR calc non Af Amer 36 (L) >60 mL/min   GFR calc Af Amer 42 (L) >60 mL/min    Comment: (NOTE) The eGFR has been calculated using the CKD EPI equation. This calculation has not been validated in all clinical situations. eGFR's persistently <60 mL/min signify possible Chronic Kidney Disease.    Anion gap 7 5 - 15    Comment: Performed at [facility], [place], [place] [address]  Lactic acid, plasma     Status: None   Collection Time: 06/22/18  4:24 AM  Result Value Ref Range   Lactic Acid, Venous 1.1 0.5 - 1.9 mmol/L    Comment: Performed at [facility], [address] [place], [place] [address]  CBC with Differential     Status: Abnormal   Collection Time: 06/22/18  4:24 AM  Result Value Ref Range   WBC 7.7 4.0 -  10.5 K/uL   RBC 4.34 3.87 - 5.11 MIL/uL   Hemoglobin 11.9 (L) 12.0 - 15.0 g/dL   HCT 37.7 36.0 - 46.0 %   MCV 86.9 80.0 - 100.0 fL   MCH 27.4 26.0 - 34.0 pg   MCHC 31.6 30.0 - 36.0 g/dL   RDW 13.5 11.5 - 15.5 %   Platelets 244 150 - 400 K/uL   nRBC 0.0 0.0 - 0.2 %   Neutrophils Relative % 61 %   Neutro Abs 4.8 1.7 - 7.7 K/uL   Lymphocytes Relative 25 %   Lymphs Abs 2.0 0.7 - 4.0 K/uL   Monocytes Relative 10 %   Monocytes Absolute 0.7 0.1 - 1.0 K/uL   Eosinophils Relative 2 %   Eosinophils Absolute 0.1 0.0 - 0.5 K/uL   Basophils Relative 1 %   Basophils Absolute 0.1 0.0 - 0.1 K/uL   Immature Granulocytes 1 %   Abs Immature Granulocytes 0.04 0.00 - 0.07 K/uL    Comment: Performed at [facility], [address] [place], [place] [address]  Type and screen [place]     Status: None   Collection Time: 06/22/18  4:24 AM   Result Value Ref Range   ABO/RH(D) A NEG    Antibody Screen NEG    Sample Expiration      06/25/2018 Performed at [facility], [address], [place], [place] [address]    Ct Angio Abd/pel W And/or Wo Contrast  Result Date: 06/22/2018 CLINICAL DATA:  GI bleed. EXAM: CTA ABDOMEN AND PELVIS wITHOUT AND WITH CONTRAST TECHNIQUE: Multidetector CT imaging of the abdomen and pelvis was performed using the standard protocol during bolus administration of intravenous contrast. Multiplanar reconstructed images and MIPs were obtained and reviewed to evaluate the vascular anatomy. CONTRAST:  128m ISOVUE-370 IOPAMIDOL (ISOVUE-370) INJECTION 76% COMPARISON:  None. FINDINGS: VASCULAR Aorta: Diffuse dense irregular calcified and noncalcified atheromatous plaque, coarse plaque causes approximately 50% luminal narrowing of the upper abdominal aorta just proximal to the SMA. No aneurysm. No evidence of vasculitis. Celiac: Severe stenosis at the origin due to calcified and noncalcified plaque. Early branching of right and left hepatic arteries versus aortic origin of the right hepatic artery. SMA: Patent without evidence of aneurysm, dissection, vasculitis or significant stenosis. Renals: Plaque at the origin of the right renal artery causes severe stenosis with near complete occlusion, tiny vessel into an atrophic right kidney. Moderate plaque at the origin of the left renal artery causes approximately 50% stenosis. IMA: Patent without evidence of aneurysm, dissection, vasculitis or significant stenosis. Inflow: Moderate plaque that evidence of aneurysm, dissection, vasculitis or significant stenosis. Proximal Outflow: Bilateral common femoral and visualized portions of the superficial and profunda femoral arteries are patent without evidence of aneurysm, dissection, vasculitis or significant stenosis. Veins: Portal and mesenteric veins appear patent and venous phase imaging. Iliac veins and IVC are  unremarkable. Review of the MIP images confirms the above findings. NON-VASCULAR Lower chest: Emphysema without consolidation or pleural effusion. Scattered atelectasis. There are coronary artery calcifications. Small hiatal hernia. Hepatobiliary: No focal liver abnormality is seen. No gallstones, gallbladder wall thickening, or biliary dilatation. Pancreas: Atrophic parenchyma. No ductal dilatation or inflammation. Spleen: Normal in size without focal abnormality. Adrenals/Urinary Tract: No adrenal nodule. Marked right renal atrophy, right kidney is poorly perfused. 11 mm cyst in the mid right kidney. No left hydronephrosis or perinephric edema. Urinary bladder is partially distended. No bladder wall thickening. Stomach/Bowel: Small hiatal hernia. Stomach is nondistended. There  is no contrast extravasation into the GI tract to localize site of GI bleed. Wall thickening of the cecum and ascending colon, with masslike thickening at the hepatic flexure, for example image 87 series 7. This area of masslike thickening spans at least 5 cm. No pericolonic stranding. No small bowel dilatation or obstruction. Moderate stool in the distal colon. Distal colonic diverticulosis without diverticulitis. Appendix not definitively visualized. Lymphatic: No enlarged abdominal or pelvic lymph nodes. Reproductive: Status post hysterectomy. No adnexal masses. Other: No free air or ascites. Musculoskeletal: Scoliotic curvature and multilevel degenerative change in the spine. There are no acute or suspicious osseous abnormalities. IMPRESSION: VASCULAR 1. No active extravasation into the GI tract to localize site of GI bleed. 2. Diffuse calcified and noncalcified atheromatous plaque throughout the abdominal aorta, plaque causes approximately 50% narrowing of the upper abdominal aorta at the level of the SMA. 3. 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 Hepatic or right  hepatic artery likely rises directly from the aorta, normal variant anatomy. 4. High-grade stenosis at the origin of the right renal artery with associated right renal atrophy and poor perfusion of the right kidney. NON-VASCULAR 1. Colonic wall thickening of the cecum and ascending colon, with masslike thickening at the hepatic flexure. Hepatic flexure findings are suspicious for colonic malignancy, with possible ascending colitis. Recommend direct visualization with colonoscopy. 2. Distal colonic diverticulosis without diverticulitis. Aortic Atherosclerosis (ICD10-I70.0) and Emphysema (ICD10-J43.9). Electronically Signed   By: Keith Rake M.D.   On: 06/22/2018 06:04    Review of Systems  Constitutional: Negative for chills and fever.  HENT: Negative for sore throat and tinnitus.   Eyes: Negative for blurred vision and redness.  Respiratory: Negative for cough and shortness of breath.   Cardiovascular: Negative for chest pain, palpitations, orthopnea and PND.  Gastrointestinal: Positive for melena. Negative for abdominal pain, diarrhea, nausea and vomiting.  Genitourinary: Negative for dysuria, frequency and urgency.  Musculoskeletal: Negative for joint pain and myalgias.  Skin: Negative for rash.       No lesions  Neurological: Negative for speech change, focal weakness and weakness.  Endo/Heme/Allergies: Does not bruise/bleed easily.       No temperature intolerance  Psychiatric/Behavioral: Negative for depression and suicidal ideas.    Blood pressure (!) 139/121, pulse 74, temperature 97.8 F (36.6 C), temperature source Oral, resp. rate (!) 21, height 5' (1.524 m), weight 40.8 kg, SpO2 96 %. Physical Exam  Vitals reviewed. Constitutional: She is oriented to person, place, and time. She appears well-developed and well-nourished. No distress.  HENT:  Head: Normocephalic and atraumatic.  Mouth/Throat: Oropharynx is clear and moist.  Eyes: Pupils are equal, round, and reactive to  light. Conjunctivae and EOM are normal. No scleral icterus.  Neck: Normal range of motion. Neck supple. No JVD present. No tracheal deviation present. No thyromegaly present.  Cardiovascular: Normal rate, regular rhythm and normal heart sounds. Exam reveals no gallop and no friction rub.  No murmur heard. Respiratory: Effort normal and breath sounds normal.  GI: Soft. Bowel sounds are normal. She exhibits no distension. There is no tenderness.  Genitourinary:  Genitourinary Comments: Deferred  Musculoskeletal: Normal range of motion. She exhibits no edema.  Lymphadenopathy:    She has no cervical adenopathy.  Neurological: She is alert and oriented to person, place, and time. No cranial nerve deficit. She exhibits normal muscle tone.  Skin: Skin is warm and dry. No rash noted. No erythema.  Psychiatric: She  has a normal mood and affect. Her behavior is normal. Judgment and thought content normal.     Assessment/Plan This is a an 84 year old female admitted for new colon mass. 1.  Colon mass: The patient will need a colonoscopy but is unable and does not have help at home to adequately complete bowel prep.  Admit and consult gastroenterology for further recommendations. 2.  Melena: She is hemodynamically stable.  Recheck hemoglobin later today. 3.  Atrial fibrillation: Controlled; continue metoprolol. Hold aspirin 4.  Hypothyroidism: Check TSH; continue Synthroid 5.  COPD: Stable; continue inhaled corticosteroid and long-acting bronchial agonist 6.  DVT prophylaxis: SCDs 7.  GI prophylaxis: PPI The patient is a full code.  Time spent on admission orders and patient care approximately 45 minutes  Harrie Foreman, MD 06/22/2018, 7:47 AM

## 2018-06-22 NOTE — ED Triage Notes (Signed)
EMS pt to room 19 from home with report of pt getting up around midnight and having a bowel movement that looked dark and tarry. Pt has hx of same with perforation of her small intestines. EMS reports hx of afib and FSBS of 149. BP 179/84. Pt denies abd pain at this time.

## 2018-06-22 NOTE — Care Management Obs Status (Signed)
[facility] NOTIFICATION   Patient Details  Name: [name] MRN: [id number] Date of Birth: Jul 19, 1928   Medicare Observation Status Notification Given:  Yes    Aleeta Schmaltz A Aveion Nguyen, RN 06/22/2018, 3:48 PM

## 2018-06-22 NOTE — ED Notes (Signed)
Pt returned from CT °

## 2018-06-22 NOTE — ED Provider Notes (Signed)
[facility] Emergency Department Provider Note  ____________________________________________   First MD Initiated Contact with Patient 06/22/18 [phone]     (approximate)  I have reviewed the triage vital signs and the nursing notes.   HISTORY  Chief Complaint Weakness and Rectal Bleeding   HPI [name] is a 84 y.o. female who comes to the emergency department via EMS after noting black tarry stool this evening at home.  She had moderate severity cramping abdominal pain prior to the bowel movement.  She became concerned because she reports having "an intestinal perforation" several years ago.  She takes aspirin daily but no other blood thinning or antiplatelet agents.  Her symptoms came on suddenly were severe and passed quickly on their own.  They are worse with defecation improved when not.  She did have a colonoscopy several years ago but she does not remember the results.  She denies fevers or chills.  She takes no other nonsteroidal medications and does not drink alcohol.  She denies nausea or vomiting.    Past Medical History:  Diagnosis Date  . Anxiety   . Atrial fibrillation ([place])   . Clotting disorder (HCC)    left ankle  . COPD (chronic obstructive pulmonary disease) ([place])   . Depression   . Overactive bladder   . Thyroid disease     Patient Active Problem List   Diagnosis Date Noted  . Melena 06/22/2018  . Protein-calorie malnutrition ([place]) 06/19/2018  . Senile purpura ([place]) 06/19/2018  . Anxiety 04/28/2018  . Cataract of both eyes 04/28/2018  . Diverticulosis 04/28/2018  . DJD (degenerative joint disease) 04/28/2018  . SOB (shortness of breath) 04/15/2018  . Oxygen dependent 02/07/2018  . COPD (chronic obstructive pulmonary disease) ([place])   . Depression   . Overactive bladder   . Hypothyroidism   . Bilateral leg edema 12/16/2017  . History of fall 08/13/2017  . Open wound of right lower leg 08/13/2017  . Obstructive chronic  bronchitis without exacerbation ([place]) 12/18/2016  . Renal artery stenosis ([place]) 12/18/2016  . Risk for falls 06/19/2016  . Chronic respiratory failure with hypoxia ([place]) 10/25/2015  . History of DVT (deep vein thrombosis) 10/11/2014  . PSVT (paroxysmal supraventricular tachycardia) ([place]) 10/22/2013  . Essential hypertension 12/20/2011  . PAF (paroxysmal atrial fibrillation) ([place]) 08/22/2011    Past Surgical History:  Procedure Laterality Date  . ABDOMINAL HYSTERECTOMY    . BREAST LUMPECTOMY      Prior to Admission medications   Medication Sig Start Date End Date Taking? Authorizing Provider  amLODipine (NORVASC) 2.5 MG tablet Take 1 tablet (2.5 mg total) by mouth daily. 05/19/18  Yes Johnson, Megan P, DO  aspirin EC 81 MG tablet Take 1 tablet (81 mg total) by mouth daily. 05/19/18  Yes Johnson, Megan P, DO  ferrous sulfate 325 (65 FE) MG tablet Take 1 tablet (325 mg total) by mouth 3 (three) times daily with meals. 06/19/18  Yes Johnson, Megan P, DO  Fluticasone-Salmeterol (ADVAIR DISKUS) 250-50 MCG/DOSE AEPB Inhale 1 puff into the lungs daily. Rinse mouth after use. 04/28/18 04/28/19 Yes Laverle Hobby, MD  levothyroxine (SYNTHROID, LEVOTHROID) 100 MCG tablet Take 1 tablet (100 mcg total) by mouth daily before breakfast. 03/27/18  Yes Johnson, Megan P, DO  metoprolol tartrate (LOPRESSOR) 25 MG tablet Take 1 tablet (25 mg total) by mouth 2 (two) times daily. 05/19/18  Yes Johnson, Megan P, DO  metoprolol tartrate (LOPRESSOR) 50 MG tablet Take 1 tablet (50 mg total) by mouth  2 (two) times daily. 05/19/18  Yes Johnson, Megan P, DO  OXYGEN Inhale 2 L into the lungs. As needed during the day, and full use at bedtime    [provider]    Allergies Patient has no known allergies.  Family History  Problem Relation Age of Onset  . Heart disease Mother   . Heart failure Mother   . Parkinson's disease Father   . Heart disease Sister   . Heart disease Son     Social  History Social History   Tobacco Use  . Smoking status: Former Smoker    Years: 25.00    Types: Cigarettes    Last attempt to quit: 02/01/1986    Years since quitting: 32.4  . Smokeless tobacco: Never Used  . Tobacco comment: quit in 1987   Substance Use Topics  . Alcohol use: Never    Frequency: Never  . Drug use: Never    Review of Systems Constitutional: No fever/chills Eyes: No visual changes. ENT: No sore throat. Cardiovascular: Denies chest pain. Respiratory: Denies shortness of breath. Gastrointestinal: Positive for abdominal pain.  No nausea, no vomiting.  No diarrhea.  No constipation. Genitourinary: Negative for dysuria. Musculoskeletal: Negative for back pain. Skin: Negative for rash. Neurological: Negative for headaches, focal weakness or numbness.   ____________________________________________   PHYSICAL EXAM:  VITAL SIGNS: ED Triage Vitals  Enc Vitals Group     BP      Pulse      Resp      Temp      Temp src      SpO2      Weight      Height      Head Circumference      Peak Flow      Pain Score      Pain Loc      Pain Edu?      Excl. in [place]?     Constitutional: Alert and oriented x4 pleasant cooperative speaks in full clear sentences no diaphoresis Eyes: PERRL EOMI. Head: Atraumatic. Nose: No congestion/rhinnorhea. Mouth/Throat: No trismus Neck: No stridor.   Cardiovascular: Normal rate, regular rhythm. Grossly normal heart sounds.  Good peripheral circulation. Respiratory: Normal respiratory effort.  No retractions. Lungs CTAB and moving good air Gastrointestinal: Soft mild diffuse tenderness with no rebound or guarding no peritonitis Rectal exam performed with female nurse chaperone and: Guaiac positive control positive melena with no hemorrhoids or fissures appreciated Musculoskeletal: No lower extremity edema   Neurologic:  Normal speech and language. No gross focal neurologic deficits are appreciated. Skin:  Skin is warm, dry and  intact. No rash noted. Psychiatric: Mood and affect are normal. Speech and behavior are normal.    ____________________________________________   DIFFERENTIAL includes but not limited to  Upper GI bleed, lower GI bleed, diverticulitis, bowel perforation, mesenteric ischemia ____________________________________________   LABS (all labs ordered are listed, but only abnormal results are displayed)  Labs Reviewed  COMPREHENSIVE METABOLIC PANEL - Abnormal; Notable for the following components:      Result Value   Glucose, Bld 111 (*)    Creatinine, Ser 1.29 (*)    Calcium 8.8 (*)    GFR calc non Af Amer 36 (*)    GFR calc Af Amer 42 (*)    All other components within normal limits  CBC WITH DIFFERENTIAL/PLATELET - Abnormal; Notable for the following components:   Hemoglobin 11.9 (*)    All other components within normal limits  LACTIC ACID, PLASMA  TYPE AND SCREEN    Lab work reviewed by me with no acute disease noted.  Her elevated creatinine is at baseline and her hemoglobin is stable __________________________________________  EKG  ED ECG REPORT I, Darel Hong, the attending physician, personally viewed and interpreted this ECG.  Date: 06/22/2018 EKG Time:  Rate: 75 Rhythm: normal sinus rhythm QRS Axis: Rightward axis Intervals: First-degree AV block ST/T Wave abnormalities: normal Narrative Interpretation: no evidence of acute ischemia  ____________________________________________  RADIOLOGY  CT angiogram reviewed by me with a number of abnormalities most notably likely colon cancer on her right side ____________________________________________   PROCEDURES  Procedure(s) performed: no  Procedures  Critical Care performed: no  ____________________________________________   INITIAL IMPRESSION / ASSESSMENT AND PLAN / ED COURSE  Pertinent labs & imaging results that were available during my care of the patient were reviewed by me and considered in my  medical decision making (see chart for details).   As part of my medical decision making, I reviewed the following data within the [facility] History obtained from family if available, nursing notes, old chart and ekg, as well as notes from prior ED visits.  Patient comes to the emergency department hemodynamically stable although with what sounds like an upper GI bleed.  Lab work is pending but I will give her an IV dose of Protonix now.  Given her antecedent abdominal pain and advanced age I will get a CT scan angios of her abdomen and pelvis given her history of atrial fibrillation.  I am also sending off a lactic acid to evaluate for mesenteric ischemia.  The patient's lab work is reassuring and her pain is completely resolved.  Unfortunately her CT scan is concerning for malignancy.  Had a lengthy discussion with the patient and her daughter at bedside and I offered inpatient admission versus outpatient management and the patient feels as if she would be unable to see both GI and oncology as an outpatient as she lives alone and is not sure she can complete the prep for a colonoscopy on her own.  I think this is completely reasonable and given her acute GI bleed she requires inpatient admission for continued monitoring and management of her hemodynamic status.  Is entirely possible her bleed will worsen and she would require a transfusion.  I spoke with the hospitalist Dr. Marcille Blanco who has graciously agreed to admit the patient to his service.      ____________________________________________   FINAL CLINICAL IMPRESSION(S) / ED DIAGNOSES  Final diagnoses:  Upper GI bleed  Malignant neoplasm of hepatic flexure (HCC)      NEW MEDICATIONS STARTED DURING THIS VISIT:  New Prescriptions   No medications on file     Note:  This document was prepared using Dragon voice recognition software and may include unintentional dictation errors.     Darel Hong, MD 06/22/18  [phone]

## 2018-06-22 NOTE — Progress Notes (Signed)
   06/22/18 1600  Clinical Encounter Type  Visited With Patient  Visit Type Follow-up;Spiritual support  Referral From Nurse  Recommendations Follow-up as requested.  Spiritual Encounters  Spiritual Needs Prayer;Emotional  Stress Factors  Patient Stress Factors Health changes   Chaplain responded to a request for prayer. Patient and Chaplain shared a pleasant conversation about her journey to [place], [place], family, and belief that everything is in God's hands. Chaplain provided emotional support and prayer.

## 2018-06-22 NOTE — Consult Note (Signed)
Vonda Antigua, MD [address], [place], [place], [place], [address] [address], [place], [place], [place], [address] Phone: [phone]  Fax: [phone]  Consultation  Referring Provider:     Dr. Posey Pronto Primary Care Physician:  Valerie Roys, DO Reason for Consultation:    Melena  Date of Admission:  06/22/2018 Date of Consultation:  06/22/2018         HPI:   [name] is a 84 y.o. female who noticed melanotic stool yesterday night, x1 episode only.  No nausea or vomiting.  Reports abdominal cramping but no abdominal pain.  Takes baby aspirin daily, denies any other NSAID use.  Was also started on oral iron replacement 3 to 4 days prior to the melanotic stool.  Hemoglobin on admission was 11.9 which is around her baseline of 11.5 on May 19, 2018.  CT scan showed wall thickening of the cecum, ascending colon with masslike thickening at the hepatic flexure.  Patient reports history of melanotic stool 4 to 5 years ago in [place], and states she was admitted at that time and was told, that she had "internal bleeding that was cauterized".  She does not remember any further details from the admission, what procedures were done at [facility].  Denies any surgery at that time.  Reports history of a colonoscopy about 4- 5 years ago as well.  Reports of any prior procedures not available to [place].  Past Medical History:  Diagnosis Date  . Anxiety   . Atrial fibrillation ([facility])   . Clotting disorder ([facility])    left ankle  . COPD (chronic obstructive pulmonary disease) ([place])   . Depression   . Overactive bladder   . Thyroid disease     Past Surgical History:  Procedure Laterality Date  . ABDOMINAL HYSTERECTOMY    . BREAST LUMPECTOMY      Prior to Admission medications   Medication Sig Start Date End Date Taking? Authorizing Provider  amLODipine (NORVASC) 2.5 MG tablet Take 1 tablet (2.5 mg total) by mouth daily. 05/19/18  Yes Johnson, Megan P, DO  aspirin EC 81 MG  tablet Take 1 tablet (81 mg total) by mouth daily. 05/19/18  Yes Johnson, Megan P, DO  ferrous sulfate 325 (65 FE) MG tablet Take 1 tablet (325 mg total) by mouth 3 (three) times daily with meals. 06/19/18  Yes Johnson, Megan P, DO  Fluticasone-Salmeterol (ADVAIR DISKUS) 250-50 MCG/DOSE AEPB Inhale 1 puff into the lungs daily. Rinse mouth after use. 04/28/18 04/28/19 Yes Laverle Hobby, MD  levothyroxine (SYNTHROID, LEVOTHROID) 100 MCG tablet Take 1 tablet (100 mcg total) by mouth daily before breakfast. 03/27/18  Yes Johnson, Megan P, DO  metoprolol tartrate (LOPRESSOR) 25 MG tablet Take 1 tablet (25 mg total) by mouth 2 (two) times daily. 05/19/18  Yes Johnson, Megan P, DO  metoprolol tartrate (LOPRESSOR) 50 MG tablet Take 1 tablet (50 mg total) by mouth 2 (two) times daily. 05/19/18  Yes Johnson, Megan P, DO  OXYGEN Inhale 2 L into the lungs. As needed during the day, and full use at bedtime    [provider]    Family History  Problem Relation Age of Onset  . Heart disease Mother   . Heart failure Mother   . Parkinson's disease Father   . Heart disease Sister   . Heart disease Son      Social History   Tobacco Use  . Smoking status: Former Smoker    Years: 25.00  Types: Cigarettes    Last attempt to quit: 02/01/1986    Years since quitting: 32.4  . Smokeless tobacco: Never Used  . Tobacco comment: quit in 1987   Substance Use Topics  . Alcohol use: Never    Frequency: Never  . Drug use: Never    Allergies as of 06/22/2018  . (No Known Allergies)    Review of Systems:    All systems reviewed and negative except where noted in HPI.   Physical Exam:  Vital signs in last 24 hours: Vitals:   06/22/18 0700 06/22/18 0800 06/22/18 0900 06/22/18 1006  BP: (!) 155/60 135/67 135/69   Pulse:   76 60  Resp: 14 18 17 20   Temp:      TempSrc:      SpO2:   94% 98%  Weight:      Height:         General:   Pleasant, cooperative in NAD Head:  Normocephalic and  atraumatic. Eyes:   No icterus.   Conjunctiva pink. PERRLA. Ears:  Normal auditory acuity. Neck:  Supple; no masses or thyroidomegaly Lungs: Respirations even and unlabored. Lungs clear to auscultation bilaterally.   No wheezes, crackles, or rhonchi.  Abdomen:  Soft, nondistended, nontender. Normal bowel sounds. No appreciable masses or hepatomegaly.  No rebound or guarding.  Neurologic:  Alert and oriented x3;  grossly normal neurologically. Skin:  Intact without significant lesions or rashes. Cervical Nodes:  No significant cervical adenopathy. Psych:  Alert and cooperative. Normal affect.  LAB RESULTS: Recent Labs    06/19/18 1354 06/22/18 0424  WBC WILL FOLLOW 7.7  HGB WILL FOLLOW 11.9*  HCT WILL FOLLOW 37.7  PLT WILL FOLLOW 244   BMET Recent Labs    06/19/18 1520 06/22/18 0424  NA 138 139  K 4.9 4.1  CL 100 105  CO2 24 27  GLUCOSE 90 111*  BUN 22 18  CREATININE 1.14* 1.29*  CALCIUM 9.2 8.8*   LFT Recent Labs    06/22/18 0424  PROT 6.5  ALBUMIN 3.7  AST 21  ALT 10  ALKPHOS 94  BILITOT 0.4   PT/INR No results for input(s): LABPROT, INR in the last 72 hours.  STUDIES: Ct Angio Abd/pel W And/or Wo Contrast  Result Date: 06/22/2018 CLINICAL DATA:  GI bleed. EXAM: CTA ABDOMEN AND PELVIS wITHOUT AND WITH CONTRAST TECHNIQUE: Multidetector CT imaging of the abdomen and pelvis was performed using the standard protocol during bolus administration of intravenous contrast. Multiplanar reconstructed images and MIPs were obtained and reviewed to evaluate the vascular anatomy. CONTRAST:  134mL ISOVUE-370 IOPAMIDOL (ISOVUE-370) INJECTION 76% COMPARISON:  None. FINDINGS: VASCULAR Aorta: Diffuse dense irregular calcified and noncalcified atheromatous plaque, coarse plaque causes approximately 50% luminal narrowing of the upper abdominal aorta just proximal to the SMA. No aneurysm. No evidence of vasculitis. Celiac: Severe stenosis at the origin due to calcified and noncalcified  plaque. Early branching of right and left hepatic arteries versus aortic origin of the right hepatic artery. SMA: Patent without evidence of aneurysm, dissection, vasculitis or significant stenosis. Renals: Plaque at the origin of the right renal artery causes severe stenosis with near complete occlusion, tiny vessel into an atrophic right kidney. Moderate plaque at the origin of the left renal artery causes approximately 50% stenosis. IMA: Patent without evidence of aneurysm, dissection, vasculitis or significant stenosis. Inflow: Moderate plaque that evidence of aneurysm, dissection, vasculitis or significant stenosis. Proximal Outflow: Bilateral common femoral and visualized portions of the superficial and profunda femoral  arteries are patent without evidence of aneurysm, dissection, vasculitis or significant stenosis. Veins: Portal and mesenteric veins appear patent and venous phase imaging. Iliac veins and IVC are unremarkable. Review of the MIP images confirms the above findings. NON-VASCULAR Lower chest: Emphysema without consolidation or pleural effusion. Scattered atelectasis. There are coronary artery calcifications. Small hiatal hernia. Hepatobiliary: No focal liver abnormality is seen. No gallstones, gallbladder wall thickening, or biliary dilatation. Pancreas: Atrophic parenchyma. No ductal dilatation or inflammation. Spleen: Normal in size without focal abnormality. Adrenals/Urinary Tract: No adrenal nodule. Marked right renal atrophy, right kidney is poorly perfused. 11 mm cyst in the mid right kidney. No left hydronephrosis or perinephric edema. Urinary bladder is partially distended. No bladder wall thickening. Stomach/Bowel: Small hiatal hernia. Stomach is nondistended. There is no contrast extravasation into the GI tract to localize site of GI bleed. Wall thickening of the cecum and ascending colon, with masslike thickening at the hepatic flexure, for example image 87 series 7. This area of  masslike thickening spans at least 5 cm. No pericolonic stranding. No small bowel dilatation or obstruction. Moderate stool in the distal colon. Distal colonic diverticulosis without diverticulitis. Appendix not definitively visualized. Lymphatic: No enlarged abdominal or pelvic lymph nodes. Reproductive: Status post hysterectomy. No adnexal masses. Other: No free air or ascites. Musculoskeletal: Scoliotic curvature and multilevel degenerative change in the spine. There are no acute or suspicious osseous abnormalities. IMPRESSION: VASCULAR 1. No active extravasation into the GI tract to localize site of GI bleed. 2. Diffuse calcified and noncalcified atheromatous plaque throughout the abdominal aorta, plaque causes approximately 50% narrowing of the upper abdominal aorta at the level of the SMA. 3. 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 Hepatic or right hepatic artery likely rises directly from the aorta, normal variant anatomy. 4. High-grade stenosis at the origin of the right renal artery with associated right renal atrophy and poor perfusion of the right kidney. NON-VASCULAR 1. Colonic wall thickening of the cecum and ascending colon, with masslike thickening at the hepatic flexure. Hepatic flexure findings are suspicious for colonic malignancy, with possible ascending colitis. Recommend direct visualization with colonoscopy. 2. Distal colonic diverticulosis without diverticulitis. Aortic Atherosclerosis (ICD10-I70.0) and Emphysema (ICD10-J43.9). Electronically Signed   By: Keith Rake M.D.   On: 06/22/2018 06:04      Impression / Plan:   [name] is a 84 y.o. y/o female with 1 day history of melanotic stool x1, in the setting of new iron replacement therapy, with first check of hemoglobin on admission at baseline, but CT reporting colon thickening and masslike effect at the hepatic flexure  Patient melanotic stool after  starting iron replacement could be from the iron itself However, CT findings are of concern and require further evaluation Patient and family agreeable to proceeding with colonoscopy If colonoscopy is negative would recommend EGD  Colonoscopy and/or EGD tomorrow with Dr. Vicente Males  PPI IV twice daily  Continue serial CBCs and transfuse PRN Avoid NSAIDs Maintain 2 large-bore IV lines Please page GI with any acute hemodynamic changes, or signs of active GI bleeding  I have discussed alternative options, risks & benefits,  which include, but are not limited to, bleeding, infection, perforation,respiratory complication & drug reaction.  The patient agrees with this plan & written consent will be obtained.    Dr. Vicente Males is on service tomorrow and patient has been signed out to him and he will be following the patient    Thank you  for involving me in the care of this patient.      LOS: 0 days   Virgel Manifold, MD  06/22/2018, 10:49 AM

## 2018-06-22 NOTE — ED Notes (Signed)
Pt to CT

## 2018-06-22 NOTE — Progress Notes (Signed)
[place] at [facility]                                                                                                                                                                                  Patient Demographics   [name], is a 84 y.o. female, DOB - Feb 20, 1928, GGE:[id number]  Admit date - 06/22/2018   Admitting Physician Harrie Foreman, MD  Outpatient Primary MD for the patient is Park Liter P, DO   LOS - 0  Subjective: Patient admitted with melena patient denies any weight loss    Review of Systems:   CONSTITUTIONAL: No documented fever. No fatigue, weakness. No weight gain, no weight loss.  EYES: No blurry or double vision.  ENT: No tinnitus. No postnasal drip. No redness of the oropharynx.  RESPIRATORY: No cough, no wheeze, no hemoptysis. No dyspnea.  CARDIOVASCULAR: No chest pain. No orthopnea. No palpitations. No syncope.  GASTROINTESTINAL: No nausea, no vomiting or diarrhea. No abdominal pain.  Positive melena or hematochezia.  GENITOURINARY: No dysuria or hematuria.  ENDOCRINE: No polyuria or nocturia. No heat or cold intolerance.  HEMATOLOGY: No anemia. No bruising. No bleeding.  INTEGUMENTARY: No rashes. No lesions.  MUSCULOSKELETAL: No arthritis. No swelling. No gout.  NEUROLOGIC: No numbness, tingling, or ataxia. No seizure-type activity.  PSYCHIATRIC: No anxiety. No insomnia. No ADD.    Vitals:   Vitals:   06/22/18 0700 06/22/18 0800 06/22/18 0900 06/22/18 1006  BP: (!) 155/60 135/67 135/69   Pulse:   76 60  Resp: 14 18 17 20   Temp:      TempSrc:      SpO2:   94% 98%  Weight:      Height:        Wt Readings from Last 3 Encounters:  06/22/18 40.8 kg  06/19/18 42.2 kg  05/19/18 42.2 kg     Intake/Output Summary (Last 24 hours) at 06/22/2018 1502 Last data filed at 06/22/2018 1358 Gross per 24 hour  Intake 120 ml  Output -  Net 120 ml    Physical Exam:   GENERAL: Pleasant-appearing in no  apparent distress.  HEAD, EYES, EARS, NOSE AND THROAT: Atraumatic, normocephalic. Extraocular muscles are intact. Pupils equal and reactive to light. Sclerae anicteric. No conjunctival injection. No oro-pharyngeal erythema.  NECK: Supple. There is no jugular venous distention. No bruits, no lymphadenopathy, no thyromegaly.  HEART: Regular rate and rhythm,. No murmurs, no rubs, no clicks.  LUNGS: Clear to auscultation bilaterally. No rales or rhonchi. No wheezes.  ABDOMEN: Soft, flat, nontender, nondistended. Has good bowel sounds. No hepatosplenomegaly appreciated.  EXTREMITIES: No evidence of any cyanosis, clubbing, or peripheral edema.  +  2 pedal and radial pulses bilaterally.  NEUROLOGIC: The patient is alert, awake, and oriented x3 with no focal motor or sensory deficits appreciated bilaterally.  SKIN: Moist and warm with no rashes appreciated.  Psych: Not anxious, depressed LN: No inguinal LN enlargement    Antibiotics   Anti-infectives (From admission, onward)   None      Medications   Scheduled Meds: . amLODipine  2.5 mg Oral Daily  . docusate sodium  100 mg Oral BID  . ferrous sulfate  325 mg Oral TID WC  . fluticasone furoate-vilanterol  1 puff Inhalation Daily  . levothyroxine  100 mcg Oral QAC breakfast  . metoprolol tartrate  50 mg Oral BID  . pantoprazole (PROTONIX) IV  40 mg Intravenous Q12H  . polyethylene glycol-electrolytes  4,000 mL Oral Once   Continuous Infusions: . sodium chloride 80 mL/hr at 06/22/18 1050   PRN Meds:.acetaminophen **OR** acetaminophen, ondansetron **OR** ondansetron (ZOFRAN) IV   Data Review:   Micro Results No results found for this or any previous visit (from the past 240 hour(s)).  Radiology Reports Ct Angio Abd/pel W And/or Wo Contrast  Result Date: 06/22/2018 CLINICAL DATA:  GI bleed. EXAM: CTA ABDOMEN AND PELVIS wITHOUT AND WITH CONTRAST TECHNIQUE: Multidetector CT imaging of the abdomen and pelvis was performed using the  standard protocol during bolus administration of intravenous contrast. Multiplanar reconstructed images and MIPs were obtained and reviewed to evaluate the vascular anatomy. CONTRAST:  130mL ISOVUE-370 IOPAMIDOL (ISOVUE-370) INJECTION 76% COMPARISON:  None. FINDINGS: VASCULAR Aorta: Diffuse dense irregular calcified and noncalcified atheromatous plaque, coarse plaque causes approximately 50% luminal narrowing of the upper abdominal aorta just proximal to the SMA. No aneurysm. No evidence of vasculitis. Celiac: Severe stenosis at the origin due to calcified and noncalcified plaque. Early branching of right and left hepatic arteries versus aortic origin of the right hepatic artery. SMA: Patent without evidence of aneurysm, dissection, vasculitis or significant stenosis. Renals: Plaque at the origin of the right renal artery causes severe stenosis with near complete occlusion, tiny vessel into an atrophic right kidney. Moderate plaque at the origin of the left renal artery causes approximately 50% stenosis. IMA: Patent without evidence of aneurysm, dissection, vasculitis or significant stenosis. Inflow: Moderate plaque that evidence of aneurysm, dissection, vasculitis or significant stenosis. Proximal Outflow: Bilateral common femoral and visualized portions of the superficial and profunda femoral arteries are patent without evidence of aneurysm, dissection, vasculitis or significant stenosis. Veins: Portal and mesenteric veins appear patent and venous phase imaging. Iliac veins and IVC are unremarkable. Review of the MIP images confirms the above findings. NON-VASCULAR Lower chest: Emphysema without consolidation or pleural effusion. Scattered atelectasis. There are coronary artery calcifications. Small hiatal hernia. Hepatobiliary: No focal liver abnormality is seen. No gallstones, gallbladder wall thickening, or biliary dilatation. Pancreas: Atrophic parenchyma. No ductal dilatation or inflammation. Spleen: Normal  in size without focal abnormality. Adrenals/Urinary Tract: No adrenal nodule. Marked right renal atrophy, right kidney is poorly perfused. 11 mm cyst in the mid right kidney. No left hydronephrosis or perinephric edema. Urinary bladder is partially distended. No bladder wall thickening. Stomach/Bowel: Small hiatal hernia. Stomach is nondistended. There is no contrast extravasation into the GI tract to localize site of GI bleed. Wall thickening of the cecum and ascending colon, with masslike thickening at the hepatic flexure, for example image 87 series 7. This area of masslike thickening spans at least 5 cm. No pericolonic stranding. No small bowel dilatation or obstruction. Moderate stool in the distal colon.  Distal colonic diverticulosis without diverticulitis. Appendix not definitively visualized. Lymphatic: No enlarged abdominal or pelvic lymph nodes. Reproductive: Status post hysterectomy. No adnexal masses. Other: No free air or ascites. Musculoskeletal: Scoliotic curvature and multilevel degenerative change in the spine. There are no acute or suspicious osseous abnormalities. IMPRESSION: VASCULAR 1. No active extravasation into the GI tract to localize site of GI bleed. 2. Diffuse calcified and noncalcified atheromatous plaque throughout the abdominal aorta, plaque causes approximately 50% narrowing of the upper abdominal aorta at the level of the SMA. 3. 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 Hepatic or right hepatic artery likely rises directly from the aorta, normal variant anatomy. 4. High-grade stenosis at the origin of the right renal artery with associated right renal atrophy and poor perfusion of the right kidney. NON-VASCULAR 1. Colonic wall thickening of the cecum and ascending colon, with masslike thickening at the hepatic flexure. Hepatic flexure findings are suspicious for colonic malignancy, with possible ascending colitis.  Recommend direct visualization with colonoscopy. 2. Distal colonic diverticulosis without diverticulitis. Aortic Atherosclerosis (ICD10-I70.0) and Emphysema (ICD10-J43.9). Electronically Signed   By: Keith Rake M.D.   On: 06/22/2018 06:04     CBC Recent Labs  Lab 06/19/18 1354 06/22/18 0424 06/22/18 1200  WBC WILL FOLLOW 7.7  --   HGB WILL FOLLOW 11.9* 12.3  HCT WILL FOLLOW 37.7  --   PLT WILL FOLLOW 244  --   MCV WILL FOLLOW 86.9  --   MCH WILL FOLLOW 27.4  --   MCHC WILL FOLLOW 31.6  --   RDW WILL FOLLOW 13.5  --   LYMPHSABS WILL FOLLOW 2.0  --   MONOABS  --  0.7  --   EOSABS  --  0.1  --   BASOSABS  --  0.1  --     Chemistries  Recent Labs  Lab 06/19/18 1520 06/22/18 0424  NA 138 139  K 4.9 4.1  CL 100 105  CO2 24 27  GLUCOSE 90 111*  BUN 22 18  CREATININE 1.14* 1.29*  CALCIUM 9.2 8.8*  AST  --  21  ALT  --  10  ALKPHOS  --  94  BILITOT  --  0.4   ------------------------------------------------------------------------------------------------------------------ estimated creatinine clearance is 19.8 mL/min (A) (by C-G formula based on SCr of 1.29 mg/dL (H)). ------------------------------------------------------------------------------------------------------------------ No results for input(s): HGBA1C in the last 72 hours. ------------------------------------------------------------------------------------------------------------------ No results for input(s): CHOL, HDL, LDLCALC, TRIG, CHOLHDL, LDLDIRECT in the last 72 hours. ------------------------------------------------------------------------------------------------------------------ No results for input(s): TSH, T4TOTAL, T3FREE, THYROIDAB in the last 72 hours.  Invalid input(s): FREET3 ------------------------------------------------------------------------------------------------------------------ No results for input(s): VITAMINB12, FOLATE, FERRITIN, TIBC, IRON, RETICCTPCT in the last 72  hours.  Coagulation profile No results for input(s): INR, PROTIME in the last 168 hours.  No results for input(s): DDIMER in the last 72 hours.  Cardiac Enzymes No results for input(s): CKMB, TROPONINI, MYOGLOBIN in the last 168 hours.  Invalid input(s): CK ------------------------------------------------------------------------------------------------------------------ Invalid input(s): [place]   This is a an 84 year old female admitted for new colon mass. 1.  Colon mass: Plan for EGD and a colonoscopy tomorrow we will check a CEA level 2.  Melena: Hemoglobin stable we will check a CBC tomorrow morning. 3.  Atrial fibrillation: Controlled; continue metoprolol.  Continue to hold aspirin 4.  Hypothyroidism:  continue Synthroid 5.  COPD: Stable; continue inhaled corticosteroid and long-acting bronchial agonist evidence exasperation 6.  DVT prophylaxis:  SCDs 7.  GI prophylaxis: PPI      Code Status Orders  (From admission, onward)         Start     Ordered   06/22/18 1035  Full code  Continuous     06/22/18 1034        Code Status History    This patient has a current code status but no historical code status.    Advance Directive Documentation     Most Recent Value  Type of Advance Directive  Living will  Pre-existing out of facility DNR order (yellow form or pink MOST form)  -  "MOST" Form in Place?  -           Consults gastroenterology  DVT Prophylaxis SCD  Lab Results  Component Value Date   PLT 244 06/22/2018     Time Spent in minutes   45 minutes 11:30 AM to 12:15 PM  greater than 50% of time spent in care coordination and counseling patient regarding the condition and plan of care.   Dustin Flock M.D on 06/22/2018 at 3:02 PM  Between 7am to 6pm - Pager - [phone]  After 6pm go to [email or web address] - Proofreader  [employer]   Office  [phone]

## 2018-06-23 ENCOUNTER — Encounter
Admission: EM | Disposition: A | Discharge status: Skilled Nursing Facility | Payer: Self-pay | Source: Home / Self Care | Attending: Surgery

## 2018-06-23 ENCOUNTER — Encounter: Payer: Self-pay | Admitting: Anesthesiology

## 2018-06-23 ENCOUNTER — Observation Stay: Payer: Medicare Other | Admitting: Anesthesiology

## 2018-06-23 ENCOUNTER — Observation Stay: Payer: Medicare Other

## 2018-06-23 DIAGNOSIS — K6389 Other specified diseases of intestine: Secondary | ICD-10-CM | POA: Diagnosis not present

## 2018-06-23 DIAGNOSIS — K921 Melena: Secondary | ICD-10-CM | POA: Diagnosis not present

## 2018-06-23 DIAGNOSIS — K56699 Other intestinal obstruction unspecified as to partial versus complete obstruction: Secondary | ICD-10-CM | POA: Diagnosis not present

## 2018-06-23 DIAGNOSIS — R933 Abnormal findings on diagnostic imaging of other parts of digestive tract: Secondary | ICD-10-CM

## 2018-06-23 DIAGNOSIS — C183 Malignant neoplasm of hepatic flexure: Principal | ICD-10-CM

## 2018-06-23 DIAGNOSIS — I4891 Unspecified atrial fibrillation: Secondary | ICD-10-CM | POA: Diagnosis not present

## 2018-06-23 DIAGNOSIS — E039 Hypothyroidism, unspecified: Secondary | ICD-10-CM | POA: Diagnosis not present

## 2018-06-23 DIAGNOSIS — J449 Chronic obstructive pulmonary disease, unspecified: Secondary | ICD-10-CM | POA: Diagnosis not present

## 2018-06-23 HISTORY — PX: ESOPHAGOGASTRODUODENOSCOPY: SHX5428

## 2018-06-23 HISTORY — PX: COLONOSCOPY: SHX5424

## 2018-06-23 SURGERY — COLONOSCOPY
Anesthesia: General | Laterality: Left

## 2018-06-23 MED ORDER — PROPOFOL 10 MG/ML IV BOLUS
INTRAVENOUS | Status: DC | PRN
  Administered 2018-06-23: 20 mg via INTRAVENOUS
  Administered 2018-06-23: 50 mg via INTRAVENOUS

## 2018-06-23 MED ORDER — SPOT INK MARKER SYRINGE KIT
PACK | SUBMUCOSAL | Status: DC | PRN
  Administered 2018-06-23: 5 mL via SUBMUCOSAL

## 2018-06-23 MED ORDER — PROPOFOL 500 MG/50ML IV EMUL
INTRAVENOUS | Status: AC
  Filled 2018-06-23: qty 50

## 2018-06-23 MED ORDER — PROPOFOL 500 MG/50ML IV EMUL
INTRAVENOUS | Status: DC | PRN
  Administered 2018-06-23: 150 ug/kg/min via INTRAVENOUS

## 2018-06-23 MED ORDER — SPOT INK MARKER SYRINGE KIT
5.0000 mL | PACK | Freq: Once | SUBMUCOSAL | Status: DC
  Filled 2018-06-23: qty 5

## 2018-06-23 MED ORDER — SODIUM CHLORIDE 0.9 % IV SOLN: INTRAVENOUS | Status: DC

## 2018-06-23 MED ORDER — LIDOCAINE HCL (PF) 2 % IJ SOLN
INTRAMUSCULAR | Status: AC
  Filled 2018-06-23: qty 10

## 2018-06-23 MED ORDER — LACTATED RINGERS IV SOLN
INTRAVENOUS | Status: DC | PRN
  Administered 2018-06-23: 11:00:00 via INTRAVENOUS

## 2018-06-23 NOTE — H&P (Signed)
Jonathon Bellows, MD [address] [place], [place], [place], [address] [address], [place], [place], [place], [address] Phone: [phone]  Fax: [phone]  Primary Care Physician:  Valerie Roys, DO   Pre-Procedure History & Physical: HPI:  [name] is a 84 y.o. female is here for an endoscopy and colonoscopy    Past Medical History:  Diagnosis Date  . Anxiety   . Atrial fibrillation ([name])   . Clotting disorder ([facility])    left ankle  . COPD (chronic obstructive pulmonary disease) ([place])   . Depression   . Overactive bladder   . Thyroid disease     Past Surgical History:  Procedure Laterality Date  . ABDOMINAL HYSTERECTOMY    . BREAST LUMPECTOMY      Prior to Admission medications   Medication Sig Start Date End Date Taking? Authorizing Provider  amLODipine (NORVASC) 2.5 MG tablet Take 1 tablet (2.5 mg total) by mouth daily. 05/19/18  Yes Johnson, Megan P, DO  aspirin EC 81 MG tablet Take 1 tablet (81 mg total) by mouth daily. 05/19/18  Yes Johnson, Megan P, DO  ferrous sulfate 325 (65 FE) MG tablet Take 1 tablet (325 mg total) by mouth 3 (three) times daily with meals. 06/19/18  Yes Johnson, Megan P, DO  Fluticasone-Salmeterol (ADVAIR DISKUS) 250-50 MCG/DOSE AEPB Inhale 1 puff into the lungs daily. Rinse mouth after use. 04/28/18 04/28/19 Yes Laverle Hobby, MD  levothyroxine (SYNTHROID, LEVOTHROID) 100 MCG tablet Take 1 tablet (100 mcg total) by mouth daily before breakfast. 03/27/18  Yes Johnson, Megan P, DO  metoprolol tartrate (LOPRESSOR) 25 MG tablet Take 1 tablet (25 mg total) by mouth 2 (two) times daily. 05/19/18  Yes Johnson, Megan P, DO  metoprolol tartrate (LOPRESSOR) 50 MG tablet Take 1 tablet (50 mg total) by mouth 2 (two) times daily. 05/19/18  Yes Johnson, Megan P, DO  OXYGEN Inhale 2 L into the lungs. As needed during the day, and full use at bedtime    [provider]    Allergies as of 06/22/2018  . (No Known Allergies)     Family History  Problem Relation Age of Onset  . Heart disease Mother   . Heart failure Mother   . Parkinson's disease Father   . Heart disease Sister   . Heart disease Son     Social History   Socioeconomic History  . Marital status: Divorced    Spouse name: Not on file  . Number of children: Not on file  . Years of education: Not on file  . Highest education level: High school graduate  Occupational History  . Not on file  Social Needs  . Financial resource strain: Somewhat hard  . Food insecurity:    Worry: Never true    Inability: Never true  . Transportation needs:    Medical: No    Non-medical: No  Tobacco Use  . Smoking status: Former Smoker    Years: 25.00    Types: Cigarettes    Last attempt to quit: 02/01/1986    Years since quitting: 32.4  . Smokeless tobacco: Never Used  . Tobacco comment: quit in 1987   Substance and Sexual Activity  . Alcohol use: Never    Frequency: Never  . Drug use: Never  . Sexual activity: Not Currently  Lifestyle  . Physical activity:    Days per week: 0 days    Minutes per session: 0 min  . Stress: Not at all  Relationships  . Social connections:    Talks on phone: More than three times a week    Gets together: More than three times a week    Attends religious service: Never    Active member of club or organization: No    Attends meetings of clubs or organizations: Never    Relationship status: Divorced  . Intimate partner violence:    Fear of current or ex partner: No    Emotionally abused: No    Physically abused: No    Forced sexual activity: No  Other Topics Concern  . Not on file  Social History Narrative   Has 3 daughters, 2 local     Review of Systems: See HPI, otherwise negative ROS  Physical Exam: BP (!) 146/75   Pulse 74   Temp 98 F (36.7 C) (Oral)   Resp 19   Ht 5' (1.524 m)   Wt 45.9 kg   SpO2 96%   BMI 19.76 kg/m  General:   Alert,  pleasant and cooperative in NAD Head:   Normocephalic and atraumatic. Neck:  Supple; no masses or thyromegaly. Lungs:  Clear throughout to auscultation, normal respiratory effort.    Heart:  +S1, +S2, Regular rate and rhythm, No edema. Abdomen:  Soft, nontender and nondistended. Normal bowel sounds, without guarding, and without rebound.   Neurologic:  Alert and  oriented x4;  grossly normal neurologically.  Impression/Plan: [name] is here for an endoscopy and colonoscopy  to be performed for  evaluation of melena and abnormal CT scan of the abdomen     Risks, benefits, limitations, and alternatives regarding endoscopy have been reviewed with the patient.  Questions have been answered.  All parties agreeable.   Jonathon Bellows, MD  06/23/2018, 11:09 AM

## 2018-06-23 NOTE — Consult Note (Addendum)
[facility] Consult Note  [name] [birth year]  [id number].    Requesting MD: Dr. Jonathon Bellows, MD Chief Complaint/Reason for Consult: Mass of Hepatic Flexure  HPI:  [name] is 84 y.o. female who presented to the ED on 10/20 with black tarry stools. She noted the onset of this acutely that morning with associated abdominal cramping. She notes that she has a history of this in the past when she was in [place], [place] and required colonoscopy for intervention. Since coming to the ED, her symptoms have resolved and she currently denied any fever, chills, CP, SOB, abdominal pain, nausea, or emesis. Her only abdominal surgery is an abdominal hysterectomy at age 25. She takes ASA but is not on any blood thinners. She was admitted to medicine with gastroenterology following. She underwent EGD and colonoscopy today which revealed a hepatic flexure mass concerning for malignancy.   General surgery was consulted by gastroenterology physician Dr. Jonathon Bellows, MD for evaluation and management of hepatic flexure mass.    ROS: Review of Systems  Constitutional: Positive for weight loss. Negative for chills and fever.  Respiratory: Negative for cough and wheezing.   Cardiovascular: Negative for chest pain and leg swelling.  Gastrointestinal: Positive for blood in stool and melena. Negative for abdominal pain, diarrhea, nausea and vomiting.  Genitourinary: Negative for dysuria and urgency.  All other systems reviewed and are negative.   Family History  Problem Relation Age of Onset  . Heart disease Mother   . Heart failure Mother   . Parkinson's disease Father   . Heart disease Sister   . Heart disease Son     Past Medical History:  Diagnosis Date  . Anxiety   . Atrial fibrillation ([place])   . Clotting disorder ([facility])    left ankle  . COPD (chronic obstructive pulmonary disease) ([place])   . Depression   . Overactive bladder   . Thyroid disease     Past Surgical  History:  Procedure Laterality Date  . ABDOMINAL HYSTERECTOMY    . BREAST LUMPECTOMY      Social History:  reports that she quit smoking about 32 years ago. Her smoking use included cigarettes. She quit after 25.00 years of use. She has never used smokeless tobacco. She reports that she does not drink alcohol or use drugs.  Allergies: No Known Allergies  Medications Prior to Admission  Medication Sig Dispense Refill  . amLODipine (NORVASC) 2.5 MG tablet Take 1 tablet (2.5 mg total) by mouth daily. 90 tablet 1  . aspirin EC 81 MG tablet Take 1 tablet (81 mg total) by mouth daily. 100 tablet 4  . ferrous sulfate 325 (65 FE) MG tablet Take 1 tablet (325 mg total) by mouth 3 (three) times daily with meals. 90 tablet 3  . Fluticasone-Salmeterol (ADVAIR DISKUS) 250-50 MCG/DOSE AEPB Inhale 1 puff into the lungs daily. Rinse mouth after use. 1 each 2  . levothyroxine (SYNTHROID, LEVOTHROID) 100 MCG tablet Take 1 tablet (100 mcg total) by mouth daily before breakfast. 90 tablet 3  . metoprolol tartrate (LOPRESSOR) 25 MG tablet Take 1 tablet (25 mg total) by mouth 2 (two) times daily. 180 tablet 1  . metoprolol tartrate (LOPRESSOR) 50 MG tablet Take 1 tablet (50 mg total) by mouth 2 (two) times daily. 180 tablet 1  . OXYGEN Inhale 2 L into the lungs. As needed during the day, and full use at bedtime      Blood pressure (!) 145/64, pulse 73, temperature (!) 96.8  F (36 C), temperature source Tympanic, resp. rate 16, height 5' (1.524 m), weight 45.9 kg, SpO2 97 %. Physical Exam: Physical Exam  Constitutional: She is oriented to person, place, and time. No distress.  Very thin elderly appearing female  HENT:  Head: Normocephalic and atraumatic.  Eyes: Pupils are equal, round, and reactive to light. Conjunctivae are normal. No scleral icterus.  Neck: Normal range of motion.  Cardiovascular: Normal rate, regular rhythm and normal heart sounds. Exam reveals no friction rub.  No murmur  heard. Pulmonary/Chest: Effort normal and breath sounds normal. No respiratory distress. She has no wheezes. She has no rales.  Abdominal: Soft. She exhibits no distension. There is no tenderness. There is no rigidity, no rebound and no guarding.  Healed previous midline laparotomy scar  Neurological: She is alert and oriented to person, place, and time. No cranial nerve deficit.  Skin: Skin is warm and dry.  Psychiatric: She has a normal mood and affect.    Results for orders placed or performed during the hospital encounter of 06/22/18 (from the past 48 hour(s))  Comprehensive metabolic panel     Status: Abnormal   Collection Time: 06/22/18  4:24 AM  Result Value Ref Range   Sodium 139 135 - 145 mmol/L   Potassium 4.1 3.5 - 5.1 mmol/L   Chloride 105 98 - 111 mmol/L   CO2 27 22 - 32 mmol/L   Glucose, Bld 111 (H) 70 - 99 mg/dL   BUN 18 8 - 23 mg/dL   Creatinine, Ser 1.29 (H) 0.44 - 1.00 mg/dL   Calcium 8.8 (L) 8.9 - 10.3 mg/dL   Total Protein 6.5 6.5 - 8.1 g/dL   Albumin 3.7 3.5 - 5.0 g/dL   AST 21 15 - 41 U/L   ALT 10 0 - 44 U/L   Alkaline Phosphatase 94 38 - 126 U/L   Total Bilirubin 0.4 0.3 - 1.2 mg/dL   GFR calc non Af Amer 36 (L) >60 mL/min   GFR calc Af Amer 42 (L) >60 mL/min    Comment: (NOTE) The eGFR has been calculated using the CKD EPI equation. This calculation has not been validated in all clinical situations. eGFR's persistently <60 mL/min signify possible Chronic Kidney Disease.    Anion gap 7 5 - 15    Comment: Performed at [facility], [address] [place], [place] [address]  Lactic acid, plasma     Status: None   Collection Time: 06/22/18  4:24 AM  Result Value Ref Range   Lactic Acid, Venous 1.1 0.5 - 1.9 mmol/L    Comment: Performed at [facility], [address] [place], [place] [address]  CBC with Differential     Status: Abnormal   Collection Time: 06/22/18  4:24 AM  Result Value Ref Range   WBC 7.7 4.0 - 10.5 K/uL   RBC  4.34 3.87 - 5.11 MIL/uL   Hemoglobin 11.9 (L) 12.0 - 15.0 g/dL   HCT 37.7 36.0 - 46.0 %   MCV 86.9 80.0 - 100.0 fL   MCH 27.4 26.0 - 34.0 pg   MCHC 31.6 30.0 - 36.0 g/dL   RDW 13.5 11.5 - 15.5 %   Platelets 244 150 - 400 K/uL   nRBC 0.0 0.0 - 0.2 %   Neutrophils Relative % 61 %   Neutro Abs 4.8 1.7 - 7.7 K/uL   Lymphocytes Relative 25 %   Lymphs Abs 2.0 0.7 - 4.0 K/uL   Monocytes Relative 10 %  Monocytes Absolute 0.7 0.1 - 1.0 K/uL   Eosinophils Relative 2 %   Eosinophils Absolute 0.1 0.0 - 0.5 K/uL   Basophils Relative 1 %   Basophils Absolute 0.1 0.0 - 0.1 K/uL   Immature Granulocytes 1 %   Abs Immature Granulocytes 0.04 0.00 - 0.07 K/uL    Comment: Performed at [facility], [address] [place], [place] [address]  Type and screen [place]     Status: None   Collection Time: 06/22/18  4:24 AM  Result Value Ref Range   ABO/RH(D) A NEG    Antibody Screen NEG    Sample Expiration      06/25/2018 Performed at [facility], [address] [place], [place] [address]   Hemoglobin     Status: None   Collection Time: 06/22/18 12:00 PM  Result Value Ref Range   Hemoglobin 12.3 12.0 - 15.0 g/dL    Comment: Performed at [facility], [place], [place] [address]   Ct Angio Abd/pel W And/or Wo Contrast  Result Date: 06/22/2018 CLINICAL DATA:  GI bleed. EXAM: CTA ABDOMEN AND PELVIS wITHOUT AND WITH CONTRAST TECHNIQUE: Multidetector CT imaging of the abdomen and pelvis was performed using the standard protocol during bolus administration of intravenous contrast. Multiplanar reconstructed images and MIPs were obtained and reviewed to evaluate the vascular anatomy. CONTRAST:  177m ISOVUE-370 IOPAMIDOL (ISOVUE-370) INJECTION 76% COMPARISON:  None. FINDINGS: VASCULAR Aorta: Diffuse dense irregular calcified and noncalcified atheromatous plaque, coarse plaque causes approximately 50% luminal narrowing of the upper  abdominal aorta just proximal to the SMA. No aneurysm. No evidence of vasculitis. Celiac: Severe stenosis at the origin due to calcified and noncalcified plaque. Early branching of right and left hepatic arteries versus aortic origin of the right hepatic artery. SMA: Patent without evidence of aneurysm, dissection, vasculitis or significant stenosis. Renals: Plaque at the origin of the right renal artery causes severe stenosis with near complete occlusion, tiny vessel into an atrophic right kidney. Moderate plaque at the origin of the left renal artery causes approximately 50% stenosis. IMA: Patent without evidence of aneurysm, dissection, vasculitis or significant stenosis. Inflow: Moderate plaque that evidence of aneurysm, dissection, vasculitis or significant stenosis. Proximal Outflow: Bilateral common femoral and visualized portions of the superficial and profunda femoral arteries are patent without evidence of aneurysm, dissection, vasculitis or significant stenosis. Veins: Portal and mesenteric veins appear patent and venous phase imaging. Iliac veins and IVC are unremarkable. Review of the MIP images confirms the above findings. NON-VASCULAR Lower chest: Emphysema without consolidation or pleural effusion. Scattered atelectasis. There are coronary artery calcifications. Small hiatal hernia. Hepatobiliary: No focal liver abnormality is seen. No gallstones, gallbladder wall thickening, or biliary dilatation. Pancreas: Atrophic parenchyma. No ductal dilatation or inflammation. Spleen: Normal in size without focal abnormality. Adrenals/Urinary Tract: No adrenal nodule. Marked right renal atrophy, right kidney is poorly perfused. 11 mm cyst in the mid right kidney. No left hydronephrosis or perinephric edema. Urinary bladder is partially distended. No bladder wall thickening. Stomach/Bowel: Small hiatal hernia. Stomach is nondistended. There is no contrast extravasation into the GI tract to localize site of GI  bleed. Wall thickening of the cecum and ascending colon, with masslike thickening at the hepatic flexure, for example image 87 series 7. This area of masslike thickening spans at least 5 cm. No pericolonic stranding. No small bowel dilatation or obstruction. Moderate stool in the distal colon. Distal colonic diverticulosis without diverticulitis. Appendix not definitively visualized. Lymphatic:  No enlarged abdominal or pelvic lymph nodes. Reproductive: Status post hysterectomy. No adnexal masses. Other: No free air or ascites. Musculoskeletal: Scoliotic curvature and multilevel degenerative change in the spine. There are no acute or suspicious osseous abnormalities. IMPRESSION: VASCULAR 1. No active extravasation into the GI tract to localize site of GI bleed. 2. Diffuse calcified and noncalcified atheromatous plaque throughout the abdominal aorta, plaque causes approximately 50% narrowing of the upper abdominal aorta at the level of the SMA. 3. 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 Hepatic or right hepatic artery likely rises directly from the aorta, normal variant anatomy. 4. High-grade stenosis at the origin of the right renal artery with associated right renal atrophy and poor perfusion of the right kidney. NON-VASCULAR 1. Colonic wall thickening of the cecum and ascending colon, with masslike thickening at the hepatic flexure. Hepatic flexure findings are suspicious for colonic malignancy, with possible ascending colitis. Recommend direct visualization with colonoscopy. 2. Distal colonic diverticulosis without diverticulitis. Aortic Atherosclerosis (ICD10-I70.0) and Emphysema (ICD10-J43.9). Electronically Signed   By: Keith Rake M.D.   On: 06/22/2018 06:04      Assessment/Plan  Hepatic Flexure Mass Nastassja Witkop is 84 y.o. female with a hepatic flexure mass visualized on colonoscopy on 06/23/18 with associated melena and  subjective weight loss which is complicated by pertinent comorbidities anxiety, atrial fibrillation on ASA, COPD, hypothyroidism, history of DVT, HTN, advanced age, and former tobacco abuse (Smoking).   - Will obtain CT Chest, CEA, and await pathology results for completion of potential staging.  - Plan for laparoscopic colectomy on Thursday 10/24 with Dr. Hampton Abbot, MD. Discussed the risks, benefits, and alternatives to this procedure with the patient and her daughter. They voiced understanding and wished to proceed.  - Okay for regular diet today - Will start on liquids and bowel prep on Wednesday - IVF, pain control PRN - Appreciate GI help - Medicine primary    Edison Simon, PA-C [facility] 06/23/2018, 2:45 PM [phone] M-F: 7am - 4pm

## 2018-06-23 NOTE — Progress Notes (Signed)
[place] [facility]                                                                                                                                                                                  Patient Demographics   [name], is a 84 y.o. female, DOB - 10-Jan-1928, LFY:[id number]  Admit date - 06/22/2018   Admitting Physician Harrie Foreman, MD  Outpatient Primary MD for the patient is Valerie Roys, DO   LOS - 0  Subjective: Patient underwent a colonoscopy which shows a malignant appearing intrinsic severe stenosis at the hepatic flexure. Patient currently asymptomatic   Review of Systems:   CONSTITUTIONAL: No documented fever. No fatigue, weakness. No weight gain, no weight loss.  EYES: No blurry or double vision.  ENT: No tinnitus. No postnasal drip. No redness of the oropharynx.  RESPIRATORY: No cough, no wheeze, no hemoptysis. No dyspnea.  CARDIOVASCULAR: No chest pain. No orthopnea. No palpitations. No syncope.  GASTROINTESTINAL: No nausea, no vomiting or diarrhea. No abdominal pain.  Positive melena or hematochezia.  GENITOURINARY: No dysuria or hematuria.  ENDOCRINE: No polyuria or nocturia. No heat or cold intolerance.  HEMATOLOGY: No anemia. No bruising. No bleeding.  INTEGUMENTARY: No rashes. No lesions.  MUSCULOSKELETAL: No arthritis. No swelling. No gout.  NEUROLOGIC: No numbness, tingling, or ataxia. No seizure-type activity.  PSYCHIATRIC: No anxiety. No insomnia. No ADD.    Vitals:   Vitals:   06/23/18 1152 06/23/18 1202 06/23/18 1212 06/23/18 1222  BP: (!) 119/48 (!) 135/48 (!) 145/79 (!) 145/64  Pulse: 66 61 74 73  Resp: 19 18 19 16   Temp: (!) 96.8 F (36 C)     TempSrc: Tympanic     SpO2: 98% 100% 99% 97%  Weight:      Height:        Wt Readings from Last 3 Encounters:  06/23/18 45.9 kg  06/19/18 42.2 kg  05/19/18 42.2 kg     Intake/Output Summary (Last 24 hours) at 06/23/2018 1532 Last data  filed at 06/23/2018 1146 Gross per 24 hour  Intake 250 ml  Output -  Net 250 ml    Physical Exam:   GENERAL: Pleasant-appearing in no apparent distress.  HEAD, EYES, EARS, NOSE AND THROAT: Atraumatic, normocephalic. Extraocular muscles are intact. Pupils equal and reactive to light. Sclerae anicteric. No conjunctival injection. No oro-pharyngeal erythema.  NECK: Supple. There is no jugular venous distention. No bruits, no lymphadenopathy, no thyromegaly.  HEART: Regular rate and rhythm,. No murmurs, no rubs, no clicks.  LUNGS: Clear to auscultation bilaterally. No rales or rhonchi. No wheezes.  ABDOMEN: Soft, flat, nontender, nondistended. Has good bowel  sounds. No hepatosplenomegaly appreciated.  EXTREMITIES: No evidence of any cyanosis, clubbing, or peripheral edema.  +2 pedal and radial pulses bilaterally.  NEUROLOGIC: The patient is alert, awake, and oriented x3 with no focal motor or sensory deficits appreciated bilaterally.  SKIN: Moist and warm with no rashes appreciated.  Psych: Not anxious, depressed LN: No inguinal LN enlargement    Antibiotics   Anti-infectives (From admission, onward)   None      Medications   Scheduled Meds: . amLODipine  2.5 mg Oral Daily  . docusate sodium  100 mg Oral BID  . ferrous sulfate  325 mg Oral TID WC  . fluticasone furoate-vilanterol  1 puff Inhalation Daily  . levothyroxine  100 mcg Oral QAC breakfast  . metoprolol tartrate  50 mg Oral BID  . pantoprazole (PROTONIX) IV  40 mg Intravenous Q12H   Continuous Infusions: . sodium chloride 80 mL/hr at 06/23/18 0332   PRN Meds:.acetaminophen **OR** acetaminophen, ondansetron **OR** ondansetron (ZOFRAN) IV   Data Review:   Micro Results No results found for this or any previous visit (from the past 240 hour(s)).  Radiology Reports Ct Chest Wo Contrast  Result Date: 06/23/2018 CLINICAL DATA:  Past medical history of atrial fibrillation, COPD and intestinal perforation. Presents  to the emergency department with dark tarry stools. CT of abdomen revealed a mass at the appendix flexure. Staging workup. EXAM: CT CHEST WITHOUT CONTRAST TECHNIQUE: Multidetector CT imaging of the chest was performed following the standard protocol without IV contrast. COMPARISON:  None. FINDINGS: Cardiovascular: Aortic atherosclerosis. No thoracic aortic aneurysm. Heart size is within normal limits. No pericardial effusion. Three-vessel coronary artery calcifications. Mediastinum/Nodes: No mass or enlarged lymph nodes seen within the mediastinum or perihilar regions. Esophagus is unremarkable. Trachea is unremarkable. Lungs/Pleura: No pulmonary nodule or mass. Diffuse emphysematous change, moderate to severe in degree, upper lobe predominant. Associated mild scarring/fibrosis at the lung apices. No pneumonia, pleural effusion or pulmonary edema. Upper Abdomen: Limited images of the upper abdomen are unremarkable. Musculoskeletal: Mild degenerative spondylosis throughout the scoliotic thoracolumbar spine. No acute or suspicious osseous finding. IMPRESSION: 1. No evidence of metastatic disease within the chest. 2. No acute findings within the chest. 3. Emphysema, moderate to severe in degree. 4. Aortic atherosclerosis. Particularly advanced atherosclerosis of the lower descending thoracic aorta and upper abdominal aorta. 5. Three-vessel coronary artery calcifications. Heart size is normal. Aortic Atherosclerosis (ICD10-I70.0) and Emphysema (ICD10-J43.9). Electronically Signed   By: Franki Cabot M.D.   On: 06/23/2018 15:12   Ct Angio Abd/pel W And/or Wo Contrast  Result Date: 06/22/2018 CLINICAL DATA:  GI bleed. EXAM: CTA ABDOMEN AND PELVIS wITHOUT AND WITH CONTRAST TECHNIQUE: Multidetector CT imaging of the abdomen and pelvis was performed using the standard protocol during bolus administration of intravenous contrast. Multiplanar reconstructed images and MIPs were obtained and reviewed to evaluate the  vascular anatomy. CONTRAST:  112mL ISOVUE-370 IOPAMIDOL (ISOVUE-370) INJECTION 76% COMPARISON:  None. FINDINGS: VASCULAR Aorta: Diffuse dense irregular calcified and noncalcified atheromatous plaque, coarse plaque causes approximately 50% luminal narrowing of the upper abdominal aorta just proximal to the SMA. No aneurysm. No evidence of vasculitis. Celiac: Severe stenosis at the origin due to calcified and noncalcified plaque. Early branching of right and left hepatic arteries versus aortic origin of the right hepatic artery. SMA: Patent without evidence of aneurysm, dissection, vasculitis or significant stenosis. Renals: Plaque at the origin of the right renal artery causes severe stenosis with near complete occlusion, tiny vessel into an atrophic right kidney. Moderate plaque  at the origin of the left renal artery causes approximately 50% stenosis. IMA: Patent without evidence of aneurysm, dissection, vasculitis or significant stenosis. Inflow: Moderate plaque that evidence of aneurysm, dissection, vasculitis or significant stenosis. Proximal Outflow: Bilateral common femoral and visualized portions of the superficial and profunda femoral arteries are patent without evidence of aneurysm, dissection, vasculitis or significant stenosis. Veins: Portal and mesenteric veins appear patent and venous phase imaging. Iliac veins and IVC are unremarkable. Review of the MIP images confirms the above findings. NON-VASCULAR Lower chest: Emphysema without consolidation or pleural effusion. Scattered atelectasis. There are coronary artery calcifications. Small hiatal hernia. Hepatobiliary: No focal liver abnormality is seen. No gallstones, gallbladder wall thickening, or biliary dilatation. Pancreas: Atrophic parenchyma. No ductal dilatation or inflammation. Spleen: Normal in size without focal abnormality. Adrenals/Urinary Tract: No adrenal nodule. Marked right renal atrophy, right kidney is poorly perfused. 11 mm cyst in the  mid right kidney. No left hydronephrosis or perinephric edema. Urinary bladder is partially distended. No bladder wall thickening. Stomach/Bowel: Small hiatal hernia. Stomach is nondistended. There is no contrast extravasation into the GI tract to localize site of GI bleed. Wall thickening of the cecum and ascending colon, with masslike thickening at the hepatic flexure, for example image 87 series 7. This area of masslike thickening spans at least 5 cm. No pericolonic stranding. No small bowel dilatation or obstruction. Moderate stool in the distal colon. Distal colonic diverticulosis without diverticulitis. Appendix not definitively visualized. Lymphatic: No enlarged abdominal or pelvic lymph nodes. Reproductive: Status post hysterectomy. No adnexal masses. Other: No free air or ascites. Musculoskeletal: Scoliotic curvature and multilevel degenerative change in the spine. There are no acute or suspicious osseous abnormalities. IMPRESSION: VASCULAR 1. No active extravasation into the GI tract to localize site of GI bleed. 2. Diffuse calcified and noncalcified atheromatous plaque throughout the abdominal aorta, plaque causes approximately 50% narrowing of the upper abdominal aorta at the level of the SMA. 3. 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 Hepatic or right hepatic artery likely rises directly from the aorta, normal variant anatomy. 4. High-grade stenosis at the origin of the right renal artery with associated right renal atrophy and poor perfusion of the right kidney. NON-VASCULAR 1. Colonic wall thickening of the cecum and ascending colon, with masslike thickening at the hepatic flexure. Hepatic flexure findings are suspicious for colonic malignancy, with possible ascending colitis. Recommend direct visualization with colonoscopy. 2. Distal colonic diverticulosis without diverticulitis. Aortic Atherosclerosis (ICD10-I70.0) and Emphysema  (ICD10-J43.9). Electronically Signed   By: Keith Rake M.D.   On: 06/22/2018 06:04     CBC Recent Labs  Lab 06/19/18 1354 06/22/18 0424 06/22/18 1200  WBC WILL FOLLOW 7.7  --   HGB WILL FOLLOW 11.9* 12.3  HCT WILL FOLLOW 37.7  --   PLT WILL FOLLOW 244  --   MCV WILL FOLLOW 86.9  --   MCH WILL FOLLOW 27.4  --   MCHC WILL FOLLOW 31.6  --   RDW WILL FOLLOW 13.5  --   LYMPHSABS WILL FOLLOW 2.0  --   MONOABS  --  0.7  --   EOSABS  --  0.1  --   BASOSABS  --  0.1  --     Chemistries  Recent Labs  Lab 06/19/18 1520 06/22/18 0424  NA 138 139  K 4.9 4.1  CL 100 105  CO2 24 27  GLUCOSE 90 111*  BUN 22 18  CREATININE 1.14*  1.29*  CALCIUM 9.2 8.8*  AST  --  21  ALT  --  10  ALKPHOS  --  94  BILITOT  --  0.4   ------------------------------------------------------------------------------------------------------------------ estimated creatinine clearance is 22.1 mL/min (A) (by C-G formula based on SCr of 1.29 mg/dL (H)). ------------------------------------------------------------------------------------------------------------------ No results for input(s): HGBA1C in the last 72 hours. ------------------------------------------------------------------------------------------------------------------ No results for input(s): CHOL, HDL, LDLCALC, TRIG, CHOLHDL, LDLDIRECT in the last 72 hours. ------------------------------------------------------------------------------------------------------------------ No results for input(s): TSH, T4TOTAL, T3FREE, THYROIDAB in the last 72 hours.  Invalid input(s): FREET3 ------------------------------------------------------------------------------------------------------------------ No results for input(s): VITAMINB12, FOLATE, FERRITIN, TIBC, IRON, RETICCTPCT in the last 72 hours.  Coagulation profile No results for input(s): INR, PROTIME in the last 168 hours.  No results for input(s): DDIMER in the last 72 hours.  Cardiac  Enzymes No results for input(s): CKMB, TROPONINI, MYOGLOBIN in the last 168 hours.  Invalid input(s): CK ------------------------------------------------------------------------------------------------------------------ Invalid input(s): [place]   This is a an 84 year old female admitted for new colon mass. 1.    Stricture at the hepatic flexure surgery has been consulted plan for possible surgery on Thursday  2.  Melena: Hemoglobin stable  3.  Atrial fibrillation: Controlled; continue metoprolol.  Continue to hold aspirin 4.  Hypothyroidism:  continue Synthroid 5.  COPD: Stable; continue inhaled corticosteroid and long-acting bronchial agonist evidence exasperation 6.  DVT prophylaxis: SCDs 7.  GI prophylaxis: PPI      Code Status Orders  (From admission, onward)         Start     Ordered   06/22/18 1035  Full code  Continuous     06/22/18 1034        Code Status History    This patient has a current code status but no historical code status.    Advance Directive Documentation     Most Recent Value  Type of Advance Directive  Living will  Pre-existing out of facility DNR order (yellow form or pink MOST form)  -  "MOST" Form in Place?  -           Consults gastroenterology  DVT Prophylaxis SCD  Lab Results  Component Value Date   PLT 244 06/22/2018     Time Spent in minutes 35 minutes 11:30 AM to 12:15 PM  greater than 50% of time spent in care coordination and counseling patient regarding the condition and plan of care.   Dustin Flock M.D on 06/23/2018 at 3:32 PM  Between 7am to 6pm - Pager - [phone]  After 6pm go to [email or web address] - Proofreader  [employer]   Office  [phone]

## 2018-06-23 NOTE — Op Note (Signed)
[facility] Patient Name: [name] Procedure Date: 06/23/2018 11:10 AM MRN: [id number] Account #: [id number] Date of Birth: 19-Oct-1927 Admit Type: Inpatient Age: 84 Room: [facility] ENDO ROOM 4 Gender: Female Note Status: Finalized Procedure:            Upper GI endoscopy Indications:          Melena Providers:            Jonathon Bellows MD, MD Referring MD:         Valerie Roys (Referring MD) Medicines:            Monitored Anesthesia Care Complications:        No immediate complications. Procedure:            Pre-Anesthesia Assessment:                       - Prior to the procedure, a History and Physical was                        performed, and patient medications, allergies and                        sensitivities were reviewed. The patient's tolerance of                        previous anesthesia was reviewed.                       - The risks and benefits of the procedure and the                        sedation options and risks were discussed with the                        patient. All questions were answered and informed                        consent was obtained.                       - ASA Grade Assessment: III - A patient with severe                        systemic disease.                       After obtaining informed consent, the endoscope was                        passed under direct vision. Throughout the procedure,                        the patient's blood pressure, pulse, and oxygen                        saturations were monitored continuously. The Endoscope                        was introduced through the mouth, and advanced to the  third part of duodenum. The upper GI endoscopy was                        accomplished with ease. The patient tolerated the                        procedure well. Findings:      The esophagus was normal.      The stomach was normal.      The examined duodenum was  normal. Impression:           - Normal esophagus.                       - Normal stomach.                       - Normal examined duodenum.                       - No specimens collected. Recommendation:       - Perform a colonoscopy today. Procedure Code(s):    --- Professional ---                       [phone], Esophagogastroduodenoscopy, flexible, transoral;                        diagnostic, including collection of specimen(s) by                        brushing or washing, when performed (separate procedure) Diagnosis Code(s):    --- Professional ---                       K92.1, Melena (includes Hematochezia) CPT copyright 2018 American Medical Association. All rights reserved. The codes documented in this report are preliminary and upon coder review may  be revised to meet current compliance requirements. Jonathon Bellows, MD Jonathon Bellows MD, MD 06/23/2018 11:28:35 AM This report has been signed electronically. Number of Addenda: 0 Note Initiated On: 06/23/2018 11:10 AM      [facility]

## 2018-06-23 NOTE — Transfer of Care (Signed)
Immediate Anesthesia Transfer of Care Note  Patient: [name]  Procedure(s) Performed: Procedure(s) with comments: COLONOSCOPY (Left) - Colonoscopy first. Dr. Vicente Males will decide if EGD is needed after colonoscopy ESOPHAGOGASTRODUODENOSCOPY (EGD) (Left) - Colonoscopy first. Dr. Vicente Males will decide if EGD is needed after colonoscopy  Patient Location: PACU and Endoscopy Unit  Anesthesia Type:General  Level of Consciousness: sedated  Airway & Oxygen Therapy: Patient Spontanous Breathing and Patient connected to nasal cannula oxygen  Post-op Assessment: Report given to RN and Post -op Vital signs reviewed and stable  Post vital signs: Reviewed and stable  Last Vitals:  Vitals:   06/23/18 0858 06/23/18 1152  BP: (!) 146/75 (!) 119/48  Pulse: 74 66  Resp:  19  Temp:  (!) 36 C  SpO2:  07%    Complications: No apparent anesthesia complications

## 2018-06-23 NOTE — Progress Notes (Addendum)
Pt has had 2 loosestools, the last one was clear. Pt did not finish container of bowel prep. VS stable. Will continue to monitor.

## 2018-06-23 NOTE — Anesthesia Procedure Notes (Addendum)
Date/Time: 06/23/2018 11:20 AM Performed by: Doreen Salvage, CRNA Pre-anesthesia Checklist: Patient identified, Emergency Drugs available, Suction available and Patient being monitored Patient Re-evaluated:Patient Re-evaluated prior to induction Oxygen Delivery Method: Nasal cannula Induction Type: IV induction Dental Injury: Teeth and Oropharynx as per pre-operative assessment  Comments: Nasal cannula with etCO2 monitoring

## 2018-06-23 NOTE — Anesthesia Post-op Follow-up Note (Signed)
Anesthesia QCDR form completed.        

## 2018-06-23 NOTE — Anesthesia Preprocedure Evaluation (Signed)
Anesthesia Evaluation  Patient identified by MRN, date of birth, ID band Patient awake    Reviewed: Allergy & Precautions, H&P , NPO status , Patient's Chart, lab work & pertinent test results  History of Anesthesia Complications Negative for: history of anesthetic complications  Airway Mallampati: II  TM Distance: >3 FB Neck ROM: full    Dental  (+) Chipped, Poor Dentition, Missing   Pulmonary shortness of breath and with exertion, COPD, former smoker,           Cardiovascular Exercise Tolerance: Good hypertension, (-) angina+ Peripheral Vascular Disease  (-) Past MI + dysrhythmias Atrial Fibrillation      Neuro/Psych PSYCHIATRIC DISORDERS negative neurological ROS  negative psych ROS   GI/Hepatic negative GI ROS, Neg liver ROS, neg GERD  ,  Endo/Other  Hypothyroidism   Renal/GU negative Renal ROS  negative genitourinary   Musculoskeletal  (+) Arthritis ,   Abdominal   Peds  Hematology negative hematology ROS (+)   Anesthesia Other Findings Past Medical History: No date: Anxiety No date: Atrial fibrillation (HCC) No date: Clotting disorder ([place])     Comment:  left ankle No date: COPD (chronic obstructive pulmonary disease) (HCC) No date: Depression No date: Overactive bladder No date: Thyroid disease  Past Surgical History: No date: ABDOMINAL HYSTERECTOMY No date: BREAST LUMPECTOMY  BMI    Body Mass Index:  19.76 kg/m      Reproductive/Obstetrics negative OB ROS                             Anesthesia Physical Anesthesia Plan  ASA: III  Anesthesia Plan: General   Post-op Pain Management:    Induction: Intravenous  PONV Risk Score and Plan: Propofol infusion and TIVA  Airway Management Planned: Natural Airway and Nasal Cannula  Additional Equipment:   Intra-op Plan:   Post-operative Plan:   Informed Consent: I have reviewed the patients History and Physical,  chart, labs and discussed the procedure including the risks, benefits and alternatives for the proposed anesthesia with the patient or authorized representative who has indicated his/her understanding and acceptance.   Dental Advisory Given  Plan Discussed with: Anesthesiologist, CRNA and Surgeon  Anesthesia Plan Comments: (Patient consented for risks of anesthesia including but not limited to:  - adverse reactions to medications - risk of intubation if required - damage to teeth, lips or other oral mucosa - sore throat or hoarseness - Damage to heart, brain, lungs or loss of life  Patient voiced understanding.)        Anesthesia Quick Evaluation

## 2018-06-23 NOTE — Op Note (Signed)
[facility] Gastroenterology Patient Name: [name] Procedure Date: 06/23/2018 11:08 AM MRN: [id number] Account #: [id number] Date of Birth: September 30, 1927 Admit Type: Inpatient Age: 84 Room: [facility] ENDO ROOM 4 Gender: Female Note Status: Finalized Procedure:            Colonoscopy Indications:          Abnormal CT of the GI tract Providers:            Jonathon Bellows MD, MD Referring MD:         Valerie Roys (Referring MD) Medicines:            Monitored Anesthesia Care Complications:        No immediate complications. Procedure:            Pre-Anesthesia Assessment:                       - Prior to the procedure, a History and Physical was                        performed, and patient medications, allergies and                        sensitivities were reviewed. The patient's tolerance of                        previous anesthesia was reviewed.                       - The risks and benefits of the procedure and the                        sedation options and risks were discussed with the                        patient. All questions were answered and informed                        consent was obtained.                       - ASA Grade Assessment: III - A patient with severe                        systemic disease.                       After obtaining informed consent, the colonoscope was                        passed under direct vision. Throughout the procedure,                        the patient's blood pressure, pulse, and oxygen                        saturations were monitored continuously. The                        Colonoscope was introduced through the anus and  advanced to the the hepatic flexure to examine a mass.                        This was the intended extent. The colonoscopy was                        performed with ease. The patient tolerated the                        procedure well. The quality of the bowel  preparation                        was good. Findings:      The perianal and digital rectal examinations were normal.      A malignant-appearing, intrinsic severe stenosis measuring 8 mm (inner       diameter) was found at the hepatic flexure and was non-traversed.       Biopsies were taken with a cold forceps for histology. Area was tattooed       with an injection of 5 mL of Spot (carbon black). At the distal aspect       of the stenosis/mass      Multiple small-mouthed diverticula were found in the left colon.      The exam was otherwise without abnormality. Impression:           - Stricture at the hepatic flexure. Biopsied. Tattooed.                       - Diverticulosis in the left colon.                       - The examination was otherwise normal. Recommendation:       - Return patient to hospital ward for ongoing care.                       - Await pathology results.                       - keep NPO                       Surgical consult to discuss surgery s she has severe                        stenosis at the hepatic flexure . Procedure Code(s):    --- Professional ---                       [phone], 45, Colonoscopy, flexible; with directed                        submucosal injection(s), any substance                       75643, 52, Colonoscopy, flexible; with biopsy, single                        or multiple Diagnosis Code(s):    --- Professional ---                       P29.518, Other intestinal obstruction unspecified as to  partial versus complete obstruction                       K57.30, Diverticulosis of large intestine without                        perforation or abscess without bleeding                       R93.3, Abnormal findings on diagnostic imaging of other                        parts of digestive tract CPT copyright 2018 American Medical Association. All rights reserved. The codes documented in this report are preliminary and upon coder  review may  be revised to meet current compliance requirements. Jonathon Bellows, MD Jonathon Bellows MD, MD 06/23/2018 11:53:31 AM This report has been signed electronically. Number of Addenda: 0 Note Initiated On: 06/23/2018 11:08 AM Total Procedure Duration: 0 hours 15 minutes 44 seconds       [facility]

## 2018-06-23 NOTE — Progress Notes (Signed)
Unable to reach cecum due to hepatic flexure mass.

## 2018-06-24 ENCOUNTER — Encounter: Payer: Self-pay | Admitting: Gastroenterology

## 2018-06-24 DIAGNOSIS — D72829 Elevated white blood cell count, unspecified: Secondary | ICD-10-CM | POA: Diagnosis present

## 2018-06-24 DIAGNOSIS — I739 Peripheral vascular disease, unspecified: Secondary | ICD-10-CM | POA: Diagnosis present

## 2018-06-24 DIAGNOSIS — C183 Malignant neoplasm of hepatic flexure: Secondary | ICD-10-CM | POA: Diagnosis present

## 2018-06-24 DIAGNOSIS — D509 Iron deficiency anemia, unspecified: Secondary | ICD-10-CM | POA: Diagnosis present

## 2018-06-24 DIAGNOSIS — K6389 Other specified diseases of intestine: Secondary | ICD-10-CM | POA: Diagnosis not present

## 2018-06-24 DIAGNOSIS — Z87891 Personal history of nicotine dependence: Secondary | ICD-10-CM | POA: Diagnosis not present

## 2018-06-24 DIAGNOSIS — G8918 Other acute postprocedural pain: Secondary | ICD-10-CM | POA: Diagnosis not present

## 2018-06-24 DIAGNOSIS — Z79890 Hormone replacement therapy (postmenopausal): Secondary | ICD-10-CM | POA: Diagnosis not present

## 2018-06-24 DIAGNOSIS — D500 Iron deficiency anemia secondary to blood loss (chronic): Secondary | ICD-10-CM | POA: Diagnosis not present

## 2018-06-24 DIAGNOSIS — K5730 Diverticulosis of large intestine without perforation or abscess without bleeding: Secondary | ICD-10-CM | POA: Diagnosis present

## 2018-06-24 DIAGNOSIS — K922 Gastrointestinal hemorrhage, unspecified: Secondary | ICD-10-CM | POA: Diagnosis not present

## 2018-06-24 DIAGNOSIS — F419 Anxiety disorder, unspecified: Secondary | ICD-10-CM | POA: Diagnosis present

## 2018-06-24 DIAGNOSIS — E440 Moderate protein-calorie malnutrition: Secondary | ICD-10-CM | POA: Diagnosis present

## 2018-06-24 DIAGNOSIS — I701 Atherosclerosis of renal artery: Secondary | ICD-10-CM | POA: Diagnosis present

## 2018-06-24 DIAGNOSIS — Z79899 Other long term (current) drug therapy: Secondary | ICD-10-CM | POA: Diagnosis not present

## 2018-06-24 DIAGNOSIS — I4891 Unspecified atrial fibrillation: Secondary | ICD-10-CM | POA: Diagnosis not present

## 2018-06-24 DIAGNOSIS — Z7982 Long term (current) use of aspirin: Secondary | ICD-10-CM | POA: Diagnosis not present

## 2018-06-24 DIAGNOSIS — C189 Malignant neoplasm of colon, unspecified: Secondary | ICD-10-CM | POA: Diagnosis not present

## 2018-06-24 DIAGNOSIS — K921 Melena: Secondary | ICD-10-CM | POA: Diagnosis not present

## 2018-06-24 DIAGNOSIS — J449 Chronic obstructive pulmonary disease, unspecified: Secondary | ICD-10-CM | POA: Diagnosis not present

## 2018-06-24 DIAGNOSIS — I774 Celiac artery compression syndrome: Secondary | ICD-10-CM | POA: Diagnosis present

## 2018-06-24 DIAGNOSIS — Z681 Body mass index (BMI) 19 or less, adult: Secondary | ICD-10-CM | POA: Diagnosis not present

## 2018-06-24 DIAGNOSIS — Z90710 Acquired absence of both cervix and uterus: Secondary | ICD-10-CM | POA: Diagnosis not present

## 2018-06-24 DIAGNOSIS — K56699 Other intestinal obstruction unspecified as to partial versus complete obstruction: Secondary | ICD-10-CM | POA: Diagnosis not present

## 2018-06-24 DIAGNOSIS — Z9981 Dependence on supplemental oxygen: Secondary | ICD-10-CM | POA: Diagnosis not present

## 2018-06-24 DIAGNOSIS — Z86718 Personal history of other venous thrombosis and embolism: Secondary | ICD-10-CM | POA: Diagnosis not present

## 2018-06-24 DIAGNOSIS — E039 Hypothyroidism, unspecified: Secondary | ICD-10-CM | POA: Diagnosis not present

## 2018-06-24 DIAGNOSIS — N3281 Overactive bladder: Secondary | ICD-10-CM | POA: Diagnosis present

## 2018-06-24 DIAGNOSIS — Z7951 Long term (current) use of inhaled steroids: Secondary | ICD-10-CM | POA: Diagnosis not present

## 2018-06-24 LAB — UA/M W/RFLX CULTURE, ROUTINE
Bilirubin, UA: NEGATIVE
Glucose, UA: NEGATIVE
Ketones, UA: NEGATIVE
Leukocytes, UA: NEGATIVE
Nitrite, UA: NEGATIVE
Protein, UA: NEGATIVE
RBC, UA: NEGATIVE
Specific Gravity, UA: 1.015 (ref 1.005–1.030)
Urobilinogen, Ur: 0.2 mg/dL (ref 0.2–1.0)
pH, UA: 7 (ref 5.0–7.5)

## 2018-06-24 LAB — CBC WITH DIFFERENTIAL/PLATELET
Hematocrit: 36.7 % (ref 34.0–46.6)
Hemoglobin: 12.5 g/dL (ref 11.1–15.9)
Lymphocytes Absolute: 2.7 10*3/uL (ref 0.7–3.1)
Lymphs: 29 %
MCH: 29.3 pg (ref 26.6–33.0)
MCHC: 34.1 g/dL (ref 31.5–35.7)
MCV: 86 fL (ref 79–97)
MID (Absolute): 0.8 10*3/uL (ref 0.1–1.6)
MID: 8 %
Neutrophils Absolute: 5.8 10*3/uL (ref 1.4–7.0)
Neutrophils: 62 %
Platelets: 260 10*3/uL (ref 150–450)
RBC: 4.27 x10E6/uL (ref 3.77–5.28)
RDW: 14.3 % (ref 12.3–15.4)
WBC: 9.3 10*3/uL (ref 3.4–10.8)

## 2018-06-24 LAB — CEA: CEA: 2.9 ng/mL (ref 0.0–4.7)

## 2018-06-24 LAB — SURGICAL PATHOLOGY

## 2018-06-24 NOTE — Progress Notes (Signed)
06/24/2018  Subjective: No bleeding episodes overnight.  Patient tolerating diet without nausea, distention.  Had CT chest yesterday to complete the staging workup and was negative for metastatic disease, but has emphysematous changes bilaterally.  Patient reports that she uses O2 at home, 2 L via nasal canula at night, and if she's doing something that exerts her.    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  Vital signs: Temp:  [97.7 F (36.5 C)-98.6 F (37 C)] 97.7 F (36.5 C) (10/22 1324) Pulse Rate:  [75-84] 75 (10/22 1324) Resp:  [17-20] 17 (10/22 0345) BP: (120-152)/(52-60) 120/55 (10/22 1324) SpO2:  [94 %-96 %] 96 % (10/22 1324) Weight:  [44.6 kg] 44.6 kg (10/22 0500)   Intake/Output: 10/21 0701 - 10/22 0700 In: 419.3 [I.V.:419.3] Out: -  Last BM Date: 06/22/18  Physical Exam: Constitutional: No acute distress Abdomen:  Soft, non-distended, non-tender.  Labs:  Recent Labs    06/22/18 0424 06/22/18 1200  WBC 7.7  --   HGB 11.9* 12.3  HCT 37.7  --   PLT 244  --    Recent Labs    06/22/18 0424  NA 139  K 4.1  CL 105  CO2 27  GLUCOSE 111*  BUN 18  CREATININE 1.29*  CALCIUM 8.8*   Pathology: DIAGNOSIS:  A. COLON MASS, HEPATIC FLEXURE; COLD BIOPSY:  - POORLY DIFFERENTIATED ADENOCARCINOMA, ULCERATED.   Imaging: CT chest 06/23/18: IMPRESSION: 1. No evidence of metastatic disease within the chest. 2. No acute findings within the chest. 3. Emphysema, moderate to severe in degree. 4. Aortic atherosclerosis. Particularly advanced atherosclerosis of the lower descending thoracic aorta and upper abdominal aorta. 5. Three-vessel coronary artery calcifications. Heart size is normal.  Assessment/Plan: This is a 84 y.o. female with GI bleeding and workup revealing colon cancer.  Discussed with the patient the new results from chest CT and biopsy.  Patient understands that based on her workup it appears the cancer  has not metastasized and plans would still be for surgical excision on 10/24 via laparoscopic right colectomy.  Oncology will be consulted post-op for further discussion about chemo.  Tomorrow will change her diet orders for clears and start the bowel prep and do preop labs/meds.   Melvyn Neth, [facility]

## 2018-06-24 NOTE — Progress Notes (Addendum)
[place] at [facility]                                                                                                                                                                                  Patient Demographics   [name], is a 84 y.o. female, DOB - [birth year], WNI:[id number]  Admit date - 06/22/2018   Admitting Physician Harrie Foreman, MD  Outpatient Primary MD for the patient is Park Liter P, DO   LOS - 0  Subjective: Patient is denying any further bleeding no abdominal pain   Review of Systems:   CONSTITUTIONAL: No documented fever. No fatigue, weakness. No weight gain, no weight loss.  EYES: No blurry or double vision.  ENT: No tinnitus. No postnasal drip. No redness of the oropharynx.  RESPIRATORY: No cough, no wheeze, no hemoptysis. No dyspnea.  CARDIOVASCULAR: No chest pain. No orthopnea. No palpitations. No syncope.  GASTROINTESTINAL: No nausea, no vomiting or diarrhea. No abdominal pain.  Positive melena or hematochezia.  GENITOURINARY: No dysuria or hematuria.  ENDOCRINE: No polyuria or nocturia. No heat or cold intolerance.  HEMATOLOGY: No anemia. No bruising. No bleeding.  INTEGUMENTARY: No rashes. No lesions.  MUSCULOSKELETAL: No arthritis. No swelling. No gout.  NEUROLOGIC: No numbness, tingling, or ataxia. No seizure-type activity.  PSYCHIATRIC: No anxiety. No insomnia. No ADD.    Vitals:   Vitals:   06/24/18 0345 06/24/18 0500 06/24/18 0810 06/24/18 1324  BP: (!) 122/52  (!) 152/60 (!) 120/55  Pulse: 78  75 75  Resp: 17     Temp: 98.6 F (37 C)   97.7 F (36.5 C)  TempSrc: Oral   Oral  SpO2: 94%   96%  Weight:  44.6 kg    Height:        Wt Readings from Last 3 Encounters:  06/24/18 44.6 kg  06/19/18 42.2 kg  05/19/18 42.2 kg     Intake/Output Summary (Last 24 hours) at 06/24/2018 1549 Last data filed at 06/24/2018 1500 Gross per 24 hour  Intake 3784.33 ml  Output -  Net 3784.33 ml     Physical Exam:   GENERAL: Pleasant-appearing in no apparent distress.  HEAD, EYES, EARS, NOSE AND THROAT: Atraumatic, normocephalic. Extraocular muscles are intact. Pupils equal and reactive to light. Sclerae anicteric. No conjunctival injection. No oro-pharyngeal erythema.  NECK: Supple. There is no jugular venous distention. No bruits, no lymphadenopathy, no thyromegaly.  HEART: Regular rate and rhythm,. No murmurs, no rubs, no clicks.  LUNGS: Clear to auscultation bilaterally. No rales or rhonchi. No wheezes.  ABDOMEN: Soft, flat, nontender, nondistended. Has good bowel sounds. No hepatosplenomegaly appreciated.  EXTREMITIES: No evidence  of any cyanosis, clubbing, or peripheral edema.  +2 pedal and radial pulses bilaterally.  NEUROLOGIC: The patient is alert, awake, and oriented x3 with no focal motor or sensory deficits appreciated bilaterally.  SKIN: Moist and warm with no rashes appreciated.  Psych: Not anxious, depressed LN: No inguinal LN enlargement    Antibiotics   Anti-infectives (From admission, onward)   None      Medications   Scheduled Meds: . amLODipine  2.5 mg Oral Daily  . docusate sodium  100 mg Oral BID  . ferrous sulfate  325 mg Oral TID WC  . fluticasone furoate-vilanterol  1 puff Inhalation Daily  . levothyroxine  100 mcg Oral QAC breakfast  . metoprolol tartrate  50 mg Oral BID  . pantoprazole (PROTONIX) IV  40 mg Intravenous Q12H   Continuous Infusions: . sodium chloride 80 mL/hr at 06/24/18 0153   PRN Meds:.acetaminophen **OR** acetaminophen, ondansetron **OR** ondansetron (ZOFRAN) IV   Data Review:   Micro Results No results found for this or any previous visit (from the past 240 hour(s)).  Radiology Reports Ct Chest Wo Contrast  Result Date: 06/23/2018 CLINICAL DATA:  Past medical history of atrial fibrillation, COPD and intestinal perforation. Presents to the emergency department with dark tarry stools. CT of abdomen revealed a mass  at the appendix flexure. Staging workup. EXAM: CT CHEST WITHOUT CONTRAST TECHNIQUE: Multidetector CT imaging of the chest was performed following the standard protocol without IV contrast. COMPARISON:  None. FINDINGS: Cardiovascular: Aortic atherosclerosis. No thoracic aortic aneurysm. Heart size is within normal limits. No pericardial effusion. Three-vessel coronary artery calcifications. Mediastinum/Nodes: No mass or enlarged lymph nodes seen within the mediastinum or perihilar regions. Esophagus is unremarkable. Trachea is unremarkable. Lungs/Pleura: No pulmonary nodule or mass. Diffuse emphysematous change, moderate to severe in degree, upper lobe predominant. Associated mild scarring/fibrosis at the lung apices. No pneumonia, pleural effusion or pulmonary edema. Upper Abdomen: Limited images of the upper abdomen are unremarkable. Musculoskeletal: Mild degenerative spondylosis throughout the scoliotic thoracolumbar spine. No acute or suspicious osseous finding. IMPRESSION: 1. No evidence of metastatic disease within the chest. 2. No acute findings within the chest. 3. Emphysema, moderate to severe in degree. 4. Aortic atherosclerosis. Particularly advanced atherosclerosis of the lower descending thoracic aorta and upper abdominal aorta. 5. Three-vessel coronary artery calcifications. Heart size is normal. Aortic Atherosclerosis (ICD10-I70.0) and Emphysema (ICD10-J43.9). Electronically Signed   By: Franki Cabot M.D.   On: 06/23/2018 15:12   Ct Angio Abd/pel W And/or Wo Contrast  Result Date: 06/22/2018 CLINICAL DATA:  GI bleed. EXAM: CTA ABDOMEN AND PELVIS wITHOUT AND WITH CONTRAST TECHNIQUE: Multidetector CT imaging of the abdomen and pelvis was performed using the standard protocol during bolus administration of intravenous contrast. Multiplanar reconstructed images and MIPs were obtained and reviewed to evaluate the vascular anatomy. CONTRAST:  133mL ISOVUE-370 IOPAMIDOL (ISOVUE-370) INJECTION 76%  COMPARISON:  None. FINDINGS: VASCULAR Aorta: Diffuse dense irregular calcified and noncalcified atheromatous plaque, coarse plaque causes approximately 50% luminal narrowing of the upper abdominal aorta just proximal to the SMA. No aneurysm. No evidence of vasculitis. Celiac: Severe stenosis at the origin due to calcified and noncalcified plaque. Early branching of right and left hepatic arteries versus aortic origin of the right hepatic artery. SMA: Patent without evidence of aneurysm, dissection, vasculitis or significant stenosis. Renals: Plaque at the origin of the right renal artery causes severe stenosis with near complete occlusion, tiny vessel into an atrophic right kidney. Moderate plaque at the origin of the left renal artery  causes approximately 50% stenosis. IMA: Patent without evidence of aneurysm, dissection, vasculitis or significant stenosis. Inflow: Moderate plaque that evidence of aneurysm, dissection, vasculitis or significant stenosis. Proximal Outflow: Bilateral common femoral and visualized portions of the superficial and profunda femoral arteries are patent without evidence of aneurysm, dissection, vasculitis or significant stenosis. Veins: Portal and mesenteric veins appear patent and venous phase imaging. Iliac veins and IVC are unremarkable. Review of the MIP images confirms the above findings. NON-VASCULAR Lower chest: Emphysema without consolidation or pleural effusion. Scattered atelectasis. There are coronary artery calcifications. Small hiatal hernia. Hepatobiliary: No focal liver abnormality is seen. No gallstones, gallbladder wall thickening, or biliary dilatation. Pancreas: Atrophic parenchyma. No ductal dilatation or inflammation. Spleen: Normal in size without focal abnormality. Adrenals/Urinary Tract: No adrenal nodule. Marked right renal atrophy, right kidney is poorly perfused. 11 mm cyst in the mid right kidney. No left hydronephrosis or perinephric edema. Urinary bladder is  partially distended. No bladder wall thickening. Stomach/Bowel: Small hiatal hernia. Stomach is nondistended. There is no contrast extravasation into the GI tract to localize site of GI bleed. Wall thickening of the cecum and ascending colon, with masslike thickening at the hepatic flexure, for example image 87 series 7. This area of masslike thickening spans at least 5 cm. No pericolonic stranding. No small bowel dilatation or obstruction. Moderate stool in the distal colon. Distal colonic diverticulosis without diverticulitis. Appendix not definitively visualized. Lymphatic: No enlarged abdominal or pelvic lymph nodes. Reproductive: Status post hysterectomy. No adnexal masses. Other: No free air or ascites. Musculoskeletal: Scoliotic curvature and multilevel degenerative change in the spine. There are no acute or suspicious osseous abnormalities. IMPRESSION: VASCULAR 1. No active extravasation into the GI tract to localize site of GI bleed. 2. Diffuse calcified and noncalcified atheromatous plaque throughout the abdominal aorta, plaque causes approximately 50% narrowing of the upper abdominal aorta at the level of the SMA. 3. 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 Hepatic or right hepatic artery likely rises directly from the aorta, normal variant anatomy. 4. High-grade stenosis at the origin of the right renal artery with associated right renal atrophy and poor perfusion of the right kidney. NON-VASCULAR 1. Colonic wall thickening of the cecum and ascending colon, with masslike thickening at the hepatic flexure. Hepatic flexure findings are suspicious for colonic malignancy, with possible ascending colitis. Recommend direct visualization with colonoscopy. 2. Distal colonic diverticulosis without diverticulitis. Aortic Atherosclerosis (ICD10-I70.0) and Emphysema (ICD10-J43.9). Electronically Signed   By: Keith Rake M.D.   On: 06/22/2018  06:04     CBC Recent Labs  Lab 06/19/18 1354 06/22/18 0424 06/22/18 1200  WBC 9.3 7.7  --   HGB 12.5 11.9* 12.3  HCT 36.7 37.7  --   PLT 260 244  --   MCV 86 86.9  --   MCH 29.3 27.4  --   MCHC 34.1 31.6  --   RDW 14.3 13.5  --   LYMPHSABS 2.7 2.0  --   MONOABS  --  0.7  --   EOSABS  --  0.1  --   BASOSABS  --  0.1  --     Chemistries  Recent Labs  Lab 06/19/18 1520 06/22/18 0424  NA 138 139  K 4.9 4.1  CL 100 105  CO2 24 27  GLUCOSE 90 111*  BUN 22 18  CREATININE 1.14* 1.29*  CALCIUM 9.2 8.8*  AST  --  21  ALT  --  10  ALKPHOS  --  94  BILITOT  --  0.4   ------------------------------------------------------------------------------------------------------------------ estimated creatinine clearance is 21.6 mL/min (A) (by C-G formula based on SCr of 1.29 mg/dL (H)). ------------------------------------------------------------------------------------------------------------------ No results for input(s): HGBA1C in the last 72 hours. ------------------------------------------------------------------------------------------------------------------ No results for input(s): CHOL, HDL, LDLCALC, TRIG, CHOLHDL, LDLDIRECT in the last 72 hours. ------------------------------------------------------------------------------------------------------------------ No results for input(s): TSH, T4TOTAL, T3FREE, THYROIDAB in the last 72 hours.  Invalid input(s): FREET3 ------------------------------------------------------------------------------------------------------------------ No results for input(s): VITAMINB12, FOLATE, FERRITIN, TIBC, IRON, RETICCTPCT in the last 72 hours.  Coagulation profile No results for input(s): INR, PROTIME in the last 168 hours.  No results for input(s): DDIMER in the last 72 hours.  Cardiac Enzymes No results for input(s): CKMB, TROPONINI, MYOGLOBIN in the last 168 hours.  Invalid input(s):  CK ------------------------------------------------------------------------------------------------------------------ Invalid input(s): [place]   This is a an 84 year old female admitted for new colon mass. 1.    Stricture at the hepatic flexure pathology positive for adenocarcinoma plan for surgery on Thursday 2.  Melena: Hemoglobin stable  3.  Atrial fibrillation: Controlled; continue metoprolol.  Continue to hold aspirin 4.  Hypothyroidism:  continue Synthroid 5.  COPD: Stable; continue inhaled corticosteroid and long-acting bronchial agonist evidence exasperation 6.  DVT prophylaxis: SCDs 7.  GI prophylaxis: PPI      Code Status Orders  (From admission, onward)         Start     Ordered   06/22/18 1035  Full code  Continuous     06/22/18 1034        Code Status History    This patient has a current code status but no historical code status.    Advance Directive Documentation     Most Recent Value  Type of Advance Directive  Living will  Pre-existing out of facility DNR order (yellow form or pink MOST form)  -  "MOST" Form in Place?  -           Consults gastroenterology  DVT Prophylaxis SCD  Lab Results  Component Value Date   PLT 244 06/22/2018     Time Spent in minutes 35 minutes   greater than 50% of time spent in care coordination and counseling patient regarding the condition and plan of care.   Dustin Flock M.D on 06/24/2018 at 3:49 PM  Between 7am to 6pm - Pager - [phone]  After 6pm go to [email or web address] - Proofreader  [employer]   Office  [phone]

## 2018-06-24 NOTE — Anesthesia Postprocedure Evaluation (Signed)
Anesthesia Post Note  Patient: [name]  Procedure(s) Performed: COLONOSCOPY (Left ) ESOPHAGOGASTRODUODENOSCOPY (EGD) (Left )  Patient location during evaluation: Endoscopy Anesthesia Type: General Level of consciousness: awake and alert Pain management: pain level controlled Vital Signs Assessment: post-procedure vital signs reviewed and stable Respiratory status: spontaneous breathing, nonlabored ventilation, respiratory function stable and patient connected to nasal cannula oxygen Cardiovascular status: blood pressure returned to baseline and stable Postop Assessment: no apparent nausea or vomiting Anesthetic complications: no     Last Vitals:  Vitals:   06/24/18 0810 06/24/18 1324  BP: (!) 152/60 (!) 120/55  Pulse: 75 75  Resp:    Temp:  36.5 C  SpO2:  96%    Last Pain:  Vitals:   06/24/18 1324  TempSrc: Oral  PainSc:                  Precious Haws [place]it[place]

## 2018-06-24 NOTE — Progress Notes (Signed)
   06/24/18 1530  Clinical Encounter Type  Visited With Patient and family together  Visit Type Initial;Spiritual support  Referral From Nurse  Consult/Referral To Chaplain  Spiritual Encounters  Spiritual Needs Prayer;Emotional;Other (Comment)   Ch received a OR to visit with Ms. [place]. The patient's grand and great grand daughter were visiting Ms. [place] but they were all grateful for the [facility] visit. I practiced active listening and offered pray and encouragement. I will continue to follow up with the patient as she will have surgery on Thursday.

## 2018-06-25 LAB — CBC WITH DIFFERENTIAL/PLATELET
Abs Immature Granulocytes: 0.02 10*3/uL (ref 0.00–0.07)
Basophils Absolute: 0 10*3/uL (ref 0.0–0.1)
Basophils Relative: 1 %
Eosinophils Absolute: 0.2 10*3/uL (ref 0.0–0.5)
Eosinophils Relative: 3 %
HCT: 35.8 % — ABNORMAL LOW (ref 36.0–46.0)
Hemoglobin: 11.4 g/dL — ABNORMAL LOW (ref 12.0–15.0)
Immature Granulocytes: 0 %
Lymphocytes Relative: 34 %
Lymphs Abs: 2.5 10*3/uL (ref 0.7–4.0)
MCH: 27.6 pg (ref 26.0–34.0)
MCHC: 31.8 g/dL (ref 30.0–36.0)
MCV: 86.7 fL (ref 80.0–100.0)
Monocytes Absolute: 0.7 10*3/uL (ref 0.1–1.0)
Monocytes Relative: 10 %
Neutro Abs: 3.9 10*3/uL (ref 1.7–7.7)
Neutrophils Relative %: 52 %
Platelets: 229 10*3/uL (ref 150–400)
RBC: 4.13 MIL/uL (ref 3.87–5.11)
RDW: 13.8 % (ref 11.5–15.5)
WBC: 7.4 10*3/uL (ref 4.0–10.5)
nRBC: 0 % (ref 0.0–0.2)

## 2018-06-25 LAB — TYPE AND SCREEN
ABO/RH(D): A NEG
Antibody Screen: NEGATIVE

## 2018-06-25 LAB — COMPREHENSIVE METABOLIC PANEL
ALT: 8 U/L (ref 0–44)
AST: 17 U/L (ref 15–41)
Albumin: 3.1 g/dL — ABNORMAL LOW (ref 3.5–5.0)
Alkaline Phosphatase: 72 U/L (ref 38–126)
Anion gap: 6 (ref 5–15)
BUN: 10 mg/dL (ref 8–23)
CO2: 25 mmol/L (ref 22–32)
Calcium: 8.3 mg/dL — ABNORMAL LOW (ref 8.9–10.3)
Chloride: 108 mmol/L (ref 98–111)
Creatinine, Ser: 1 mg/dL (ref 0.44–1.00)
GFR calc Af Amer: 57 mL/min — ABNORMAL LOW (ref >60)
GFR calc non Af Amer: 49 mL/min — ABNORMAL LOW (ref >60)
Glucose, Bld: 91 mg/dL (ref 70–99)
Potassium: 3.7 mmol/L (ref 3.5–5.1)
Sodium: 139 mmol/L (ref 135–145)
Total Bilirubin: 0.6 mg/dL (ref 0.3–1.2)
Total Protein: 5.5 g/dL — ABNORMAL LOW (ref 6.5–8.1)

## 2018-06-25 LAB — PROTIME-INR
INR: 1.1
Prothrombin Time: 14.1 seconds (ref 11.4–15.2)

## 2018-06-25 MED ORDER — MAGNESIUM CITRATE PO SOLN
1.0000 | Freq: Once | ORAL | Status: AC
  Administered 2018-06-25: 21:00:00 1 via ORAL
  Filled 2018-06-25: qty 296

## 2018-06-25 MED ORDER — ERYTHROMYCIN BASE 250 MG PO TBEC
1000.0000 mg | DELAYED_RELEASE_TABLET | Freq: Four times a day (QID) | ORAL | Status: DC
  Administered 2018-06-25 (×2): 1000 mg via ORAL
  Filled 2018-06-25 (×6): qty 4

## 2018-06-25 MED ORDER — NEOMYCIN SULFATE 500 MG PO TABS
1000.0000 mg | ORAL_TABLET | Freq: Four times a day (QID) | ORAL | Status: AC
  Administered 2018-06-25 (×3): 1000 mg via ORAL
  Filled 2018-06-25 (×4): qty 2

## 2018-06-25 MED ORDER — LACTATED RINGERS IV SOLN
INTRAVENOUS | Status: DC
  Administered 2018-06-25 – 2018-06-28 (×6): via INTRAVENOUS

## 2018-06-25 MED ORDER — MAGNESIUM CITRATE PO SOLN
1.0000 | Freq: Once | ORAL | Status: AC
  Administered 2018-06-25: 1 via ORAL
  Filled 2018-06-25: qty 296

## 2018-06-25 MED ORDER — SODIUM CHLORIDE 0.9 % IV SOLN
2.0000 g | INTRAVENOUS | Status: AC
  Administered 2018-06-26: 2 g via INTRAVENOUS
  Filled 2018-06-25: qty 2

## 2018-06-25 NOTE — Progress Notes (Signed)
[place] at [facility]                                                                                                                                                                                  Patient Demographics   [name], is a 84 y.o. female, DOB - June 21, 1928, QIO:[id number]  Admit date - 06/22/2018   Admitting Physician Harrie Foreman, MD  Outpatient Primary MD for the patient is Park Liter P, DO   LOS - 1  Subjective: Patient denying any bleeding awaiting surgery for tomorrow   Review of Systems:   CONSTITUTIONAL: No documented fever. No fatigue, weakness. No weight gain, no weight loss.  EYES: No blurry or double vision.  ENT: No tinnitus. No postnasal drip. No redness of the oropharynx.  RESPIRATORY: No cough, no wheeze, no hemoptysis. No dyspnea.  CARDIOVASCULAR: No chest pain. No orthopnea. No palpitations. No syncope.  GASTROINTESTINAL: No nausea, no vomiting or diarrhea. No abdominal pain.  Positive melena or hematochezia.  GENITOURINARY: No dysuria or hematuria.  ENDOCRINE: No polyuria or nocturia. No heat or cold intolerance.  HEMATOLOGY: No anemia. No bruising. No bleeding.  INTEGUMENTARY: No rashes. No lesions.  MUSCULOSKELETAL: No arthritis. No swelling. No gout.  NEUROLOGIC: No numbness, tingling, or ataxia. No seizure-type activity.  PSYCHIATRIC: No anxiety. No insomnia. No ADD.    Vitals:   Vitals:   06/24/18 1946 06/25/18 0412 06/25/18 0903 06/25/18 1334  BP: 136/61 (!) 159/68 (!) 158/73 (!) 160/89  Pulse: 77 69 75 71  Resp: 16 13 15 18   Temp: 98 F (36.7 C) 97.9 F (36.6 C) (!) 97.5 F (36.4 C) 97.9 F (36.6 C)  TempSrc: Oral Oral Oral Oral  SpO2: 94% 97% 96% 94%  Weight:  43.8 kg    Height:        Wt Readings from Last 3 Encounters:  06/25/18 43.8 kg  06/19/18 42.2 kg  05/19/18 42.2 kg     Intake/Output Summary (Last 24 hours) at 06/25/2018 1429 Last data filed at 06/25/2018  1012 Gross per 24 hour  Intake 3735 ml  Output -  Net 3735 ml    Physical Exam:   GENERAL: Pleasant-appearing in no apparent distress.  HEAD, EYES, EARS, NOSE AND THROAT: Atraumatic, normocephalic. Extraocular muscles are intact. Pupils equal and reactive to light. Sclerae anicteric. No conjunctival injection. No oro-pharyngeal erythema.  NECK: Supple. There is no jugular venous distention. No bruits, no lymphadenopathy, no thyromegaly.  HEART: Regular rate and rhythm,. No murmurs, no rubs, no clicks.  LUNGS: Clear to auscultation bilaterally. No rales or rhonchi. No wheezes.  ABDOMEN: Soft, flat, nontender, nondistended. Has good bowel sounds. No  hepatosplenomegaly appreciated.  EXTREMITIES: No evidence of any cyanosis, clubbing, or peripheral edema.  +2 pedal and radial pulses bilaterally.  NEUROLOGIC: The patient is alert, awake, and oriented x3 with no focal motor or sensory deficits appreciated bilaterally.  SKIN: Moist and warm with no rashes appreciated.  Psych: Not anxious, depressed LN: No inguinal LN enlargement    Antibiotics   Anti-infectives (From admission, onward)   Start     Dose/Rate Route Frequency Ordered Stop   06/26/18 0600  cefoTEtan (CEFOTAN) 2 g in sodium chloride 0.9 % 100 mL IVPB     2 g 200 mL/hr over 30 Minutes Intravenous On call to O.R. 06/25/18 1154 06/27/18 0559   06/25/18 1200  neomycin (MYCIFRADIN) tablet 1,000 mg     1,000 mg Oral Every 6 hours 06/25/18 1154 06/26/18 0559   06/25/18 1200  erythromycin (ERY-TAB) EC tablet 1,000 mg     1,000 mg Oral Every 6 hours 06/25/18 1154 06/26/18 0559      Medications   Scheduled Meds: . amLODipine  2.5 mg Oral Daily  . docusate sodium  100 mg Oral BID  . erythromycin  1,000 mg Oral Q6H  . ferrous sulfate  325 mg Oral TID WC  . fluticasone furoate-vilanterol  1 puff Inhalation Daily  . levothyroxine  100 mcg Oral QAC breakfast  . magnesium citrate  1 Bottle Oral Once  . metoprolol tartrate  50 mg  Oral BID  . neomycin  1,000 mg Oral Q6H  . pantoprazole (PROTONIX) IV  40 mg Intravenous Q12H   Continuous Infusions: . [START ON 06/26/2018] cefoTEtan (CEFOTAN) IV    . [START ON 06/26/2018] lactated ringers     PRN Meds:.acetaminophen **OR** acetaminophen, ondansetron **OR** ondansetron (ZOFRAN) IV   Data Review:   Micro Results No results found for this or any previous visit (from the past 240 hour(s)).  Radiology Reports Ct Chest Wo Contrast  Result Date: 06/23/2018 CLINICAL DATA:  Past medical history of atrial fibrillation, COPD and intestinal perforation. Presents to the emergency department with dark tarry stools. CT of abdomen revealed a mass at the appendix flexure. Staging workup. EXAM: CT CHEST WITHOUT CONTRAST TECHNIQUE: Multidetector CT imaging of the chest was performed following the standard protocol without IV contrast. COMPARISON:  None. FINDINGS: Cardiovascular: Aortic atherosclerosis. No thoracic aortic aneurysm. Heart size is within normal limits. No pericardial effusion. Three-vessel coronary artery calcifications. Mediastinum/Nodes: No mass or enlarged lymph nodes seen within the mediastinum or perihilar regions. Esophagus is unremarkable. Trachea is unremarkable. Lungs/Pleura: No pulmonary nodule or mass. Diffuse emphysematous change, moderate to severe in degree, upper lobe predominant. Associated mild scarring/fibrosis at the lung apices. No pneumonia, pleural effusion or pulmonary edema. Upper Abdomen: Limited images of the upper abdomen are unremarkable. Musculoskeletal: Mild degenerative spondylosis throughout the scoliotic thoracolumbar spine. No acute or suspicious osseous finding. IMPRESSION: 1. No evidence of metastatic disease within the chest. 2. No acute findings within the chest. 3. Emphysema, moderate to severe in degree. 4. Aortic atherosclerosis. Particularly advanced atherosclerosis of the lower descending thoracic aorta and upper abdominal aorta. 5.  Three-vessel coronary artery calcifications. Heart size is normal. Aortic Atherosclerosis (ICD10-I70.0) and Emphysema (ICD10-J43.9). Electronically Signed   By: Franki Cabot M.D.   On: 06/23/2018 15:12   Ct Angio Abd/pel W And/or Wo Contrast  Result Date: 06/22/2018 CLINICAL DATA:  GI bleed. EXAM: CTA ABDOMEN AND PELVIS wITHOUT AND WITH CONTRAST TECHNIQUE: Multidetector CT imaging of the abdomen and pelvis was performed using the standard protocol during bolus  administration of intravenous contrast. Multiplanar reconstructed images and MIPs were obtained and reviewed to evaluate the vascular anatomy. CONTRAST:  126mL ISOVUE-370 IOPAMIDOL (ISOVUE-370) INJECTION 76% COMPARISON:  None. FINDINGS: VASCULAR Aorta: Diffuse dense irregular calcified and noncalcified atheromatous plaque, coarse plaque causes approximately 50% luminal narrowing of the upper abdominal aorta just proximal to the SMA. No aneurysm. No evidence of vasculitis. Celiac: Severe stenosis at the origin due to calcified and noncalcified plaque. Early branching of right and left hepatic arteries versus aortic origin of the right hepatic artery. SMA: Patent without evidence of aneurysm, dissection, vasculitis or significant stenosis. Renals: Plaque at the origin of the right renal artery causes severe stenosis with near complete occlusion, tiny vessel into an atrophic right kidney. Moderate plaque at the origin of the left renal artery causes approximately 50% stenosis. IMA: Patent without evidence of aneurysm, dissection, vasculitis or significant stenosis. Inflow: Moderate plaque that evidence of aneurysm, dissection, vasculitis or significant stenosis. Proximal Outflow: Bilateral common femoral and visualized portions of the superficial and profunda femoral arteries are patent without evidence of aneurysm, dissection, vasculitis or significant stenosis. Veins: Portal and mesenteric veins appear patent and venous phase imaging. Iliac veins and IVC  are unremarkable. Review of the MIP images confirms the above findings. NON-VASCULAR Lower chest: Emphysema without consolidation or pleural effusion. Scattered atelectasis. There are coronary artery calcifications. Small hiatal hernia. Hepatobiliary: No focal liver abnormality is seen. No gallstones, gallbladder wall thickening, or biliary dilatation. Pancreas: Atrophic parenchyma. No ductal dilatation or inflammation. Spleen: Normal in size without focal abnormality. Adrenals/Urinary Tract: No adrenal nodule. Marked right renal atrophy, right kidney is poorly perfused. 11 mm cyst in the mid right kidney. No left hydronephrosis or perinephric edema. Urinary bladder is partially distended. No bladder wall thickening. Stomach/Bowel: Small hiatal hernia. Stomach is nondistended. There is no contrast extravasation into the GI tract to localize site of GI bleed. Wall thickening of the cecum and ascending colon, with masslike thickening at the hepatic flexure, for example image 87 series 7. This area of masslike thickening spans at least 5 cm. No pericolonic stranding. No small bowel dilatation or obstruction. Moderate stool in the distal colon. Distal colonic diverticulosis without diverticulitis. Appendix not definitively visualized. Lymphatic: No enlarged abdominal or pelvic lymph nodes. Reproductive: Status post hysterectomy. No adnexal masses. Other: No free air or ascites. Musculoskeletal: Scoliotic curvature and multilevel degenerative change in the spine. There are no acute or suspicious osseous abnormalities. IMPRESSION: VASCULAR 1. No active extravasation into the GI tract to localize site of GI bleed. 2. Diffuse calcified and noncalcified atheromatous plaque throughout the abdominal aorta, plaque causes approximately 50% narrowing of the upper abdominal aorta at the level of the SMA. 3. 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 Hepatic or right  hepatic artery likely rises directly from the aorta, normal variant anatomy. 4. High-grade stenosis at the origin of the right renal artery with associated right renal atrophy and poor perfusion of the right kidney. NON-VASCULAR 1. Colonic wall thickening of the cecum and ascending colon, with masslike thickening at the hepatic flexure. Hepatic flexure findings are suspicious for colonic malignancy, with possible ascending colitis. Recommend direct visualization with colonoscopy. 2. Distal colonic diverticulosis without diverticulitis. Aortic Atherosclerosis (ICD10-I70.0) and Emphysema (ICD10-J43.9). Electronically Signed   By: Keith Rake M.D.   On: 06/22/2018 06:04     CBC Recent Labs  Lab 06/19/18 1354 06/22/18 0424 06/22/18 1200 06/25/18 0345  WBC 9.3 7.7  --  7.4  HGB 12.5 11.9* 12.3 11.4*  HCT 36.7 37.7  --  35.8*  PLT 260 244  --  229  MCV 86 86.9  --  86.7  MCH 29.3 27.4  --  27.6  MCHC 34.1 31.6  --  31.8  RDW 14.3 13.5  --  13.8  LYMPHSABS 2.7 2.0  --  2.5  MONOABS  --  0.7  --  0.7  EOSABS  --  0.1  --  0.2  BASOSABS  --  0.1  --  0.0    Chemistries  Recent Labs  Lab 06/19/18 1520 06/22/18 0424 06/25/18 0345  NA 138 139 139  K 4.9 4.1 3.7  CL 100 105 108  CO2 24 27 25   GLUCOSE 90 111* 91  BUN 22 18 10   CREATININE 1.14* 1.29* 1.00  CALCIUM 9.2 8.8* 8.3*  AST  --  21 17  ALT  --  10 8  ALKPHOS  --  94 72  BILITOT  --  0.4 0.6   ------------------------------------------------------------------------------------------------------------------ estimated creatinine clearance is 26.9 mL/min (by C-G formula based on SCr of 1 mg/dL). ------------------------------------------------------------------------------------------------------------------ No results for input(s): HGBA1C in the last 72 hours. ------------------------------------------------------------------------------------------------------------------ No results for input(s): CHOL, HDL, LDLCALC, TRIG,  CHOLHDL, LDLDIRECT in the last 72 hours. ------------------------------------------------------------------------------------------------------------------ No results for input(s): TSH, T4TOTAL, T3FREE, THYROIDAB in the last 72 hours.  Invalid input(s): FREET3 ------------------------------------------------------------------------------------------------------------------ No results for input(s): VITAMINB12, FOLATE, FERRITIN, TIBC, IRON, RETICCTPCT in the last 72 hours.  Coagulation profile Recent Labs  Lab 06/25/18 0345  INR 1.10    No results for input(s): DDIMER in the last 72 hours.  Cardiac Enzymes No results for input(s): CKMB, TROPONINI, MYOGLOBIN in the last 168 hours.  Invalid input(s): CK ------------------------------------------------------------------------------------------------------------------ Invalid input(s): [place]   This is a an 84 year old female admitted for new colon mass. 1.  Stricture at the hepatic flexure pathology positive for adenocarcinoma plan for surgery on Thursday 2.  Melena: Hemoglobin stable  3.  Atrial fibrillation: Controlled; continue metoprolol.  Continue to hold aspirin 4.  Hypothyroidism:  continue Synthroid 5.  COPD: Stable; continue inhaled corticosteroid and long-acting bronchial agonist evidence exasperation 6.  DVT prophylaxis: SCDs 7.  GI prophylaxis: PPI      Code Status Orders  (From admission, onward)         Start     Ordered   06/22/18 1035  Full code  Continuous     06/22/18 1034        Code Status History    This patient has a current code status but no historical code status.    Advance Directive Documentation     Most Recent Value  Type of Advance Directive  Living will  Pre-existing out of facility DNR order (yellow form or pink MOST form)  -  "MOST" Form in Place?  -           Consults gastroenterology  DVT Prophylaxis SCD  Lab Results  Component Value Date   PLT  229 06/25/2018     Time Spent in minutes 35 minutes 11:30 AM to 12:15 PM  greater than 50% of time spent in care coordination and counseling patient regarding the condition and plan of care.   Dustin Flock M.D on 06/25/2018 at 2:29 PM  Between 7am to 6pm - Pager - [phone]  After 6pm go to [email or web address] - Proofreader  [employer]   Office  [phone]

## 2018-06-25 NOTE — Progress Notes (Signed)
[facility] Progress Note  2 Days Post-Op  Subjective: Patient is resting comfortably in bed. No complaints of abdominal pain, nausea, or emesis. Had been tolerating a diet but on clears today for surgical prep. No additional complaints.   Objective: Vital signs in last 24 hours: Temp:  [97.7 F (36.5 C)-98 F (36.7 C)] 97.9 F (36.6 C) (10/23 0412) Pulse Rate:  [69-77] 69 (10/23 0412) Resp:  [13-16] 13 (10/23 0412) BP: (120-159)/(55-68) 159/68 (10/23 0412) SpO2:  [94 %-97 %] 97 % (10/23 0412) Weight:  [43.8 kg] 43.8 kg (10/23 0412) Last BM Date: 06/22/18  Intake/Output from previous day: 10/22 0701 - 10/23 0700 In: 3615 [I.V.:3615] Out: -  Intake/Output this shift: No intake/output data recorded.  PE: Gen:  Alert, NAD, pleasant Card:  Regular rate and rhythm, pedal pulses 2+ BL Pulm:  Normal effort Abd: Soft, non-tender, non-distended Skin: warm and dry, no rashes  Psych: A&Ox3   Lab Results:  Recent Labs    06/22/18 1200 06/25/18 0345  WBC  --  7.4  HGB 12.3 11.4*  HCT  --  35.8*  PLT  --  229   BMET Recent Labs    06/25/18 0345  NA 139  K 3.7  CL 108  CO2 25  GLUCOSE 91  BUN 10  CREATININE 1.00  CALCIUM 8.3*   PT/INR Recent Labs    06/25/18 0345  LABPROT 14.1  INR 1.10   CMP     Component Value Date/Time   NA 139 06/25/2018 0345   NA 138 06/19/2018 1520   K 3.7 06/25/2018 0345   CL 108 06/25/2018 0345   CO2 25 06/25/2018 0345   GLUCOSE 91 06/25/2018 0345   BUN 10 06/25/2018 0345   BUN 22 06/19/2018 1520   CREATININE 1.00 06/25/2018 0345   CALCIUM 8.3 (L) 06/25/2018 0345   PROT 5.5 (L) 06/25/2018 0345   PROT 6.4 05/19/2018 1433   ALBUMIN 3.1 (L) 06/25/2018 0345   ALBUMIN 4.1 05/19/2018 1433   AST 17 06/25/2018 0345   ALT 8 06/25/2018 0345   ALKPHOS 72 06/25/2018 0345   BILITOT 0.6 06/25/2018 0345   BILITOT 0.3 05/19/2018 1433   GFRNONAA 49 (L) 06/25/2018 0345   GFRAA 57 (L) 06/25/2018 0345   Lipase  No  results found for: LIPASE     Studies/Results: Ct Chest Wo Contrast  Result Date: 06/23/2018 CLINICAL DATA:  Past medical history of atrial fibrillation, COPD and intestinal perforation. Presents to the emergency department with dark tarry stools. CT of abdomen revealed a mass at the appendix flexure. Staging workup. EXAM: CT CHEST WITHOUT CONTRAST TECHNIQUE: Multidetector CT imaging of the chest was performed following the standard protocol without IV contrast. COMPARISON:  None. FINDINGS: Cardiovascular: Aortic atherosclerosis. No thoracic aortic aneurysm. Heart size is within normal limits. No pericardial effusion. Three-vessel coronary artery calcifications. Mediastinum/Nodes: No mass or enlarged lymph nodes seen within the mediastinum or perihilar regions. Esophagus is unremarkable. Trachea is unremarkable. Lungs/Pleura: No pulmonary nodule or mass. Diffuse emphysematous change, moderate to severe in degree, upper lobe predominant. Associated mild scarring/fibrosis at the lung apices. No pneumonia, pleural effusion or pulmonary edema. Upper Abdomen: Limited images of the upper abdomen are unremarkable. Musculoskeletal: Mild degenerative spondylosis throughout the scoliotic thoracolumbar spine. No acute or suspicious osseous finding. IMPRESSION: 1. No evidence of metastatic disease within the chest. 2. No acute findings within the chest. 3. Emphysema, moderate to severe in degree. 4. Aortic atherosclerosis. Particularly advanced atherosclerosis of the lower descending thoracic  aorta and upper abdominal aorta. 5. Three-vessel coronary artery calcifications. Heart size is normal. Aortic Atherosclerosis (ICD10-I70.0) and Emphysema (ICD10-J43.9). Electronically Signed   By: Franki Cabot M.D.   On: 06/23/2018 15:12    Anti-infectives: Anti-infectives (From admission, onward)   None       Assessment/Plan  Hepatic Flexure Mass [name] is 84 y.o. female with a hepatic flexure mass  visualized on colonoscopy on 06/23/18 with associated melena and subjective weight loss which is complicated by pertinent comorbidities anxiety, atrial fibrillation on ASA, COPD, hypothyroidism, history of DVT, HTN, advanced age, and former tobacco abuse (Smoking).   - Plan for laparoscopic colectomy on Thursday 10/24 with Dr. Hampton Abbot, MD. Discussed the risks, benefits, and alternatives to this procedure with the patient and her daughter. They voiced understanding and wished to proceed.  - Clear liquid diet today, NPO after midnight - Bowel Prep today - IVF, pain control PRN - Medicine primary     LOS: 1 day    Edison Simon , PA-C [facility] 06/25/2018, 8:40 AM [phone] M-F: 7am - 4pm

## 2018-06-25 NOTE — Progress Notes (Signed)
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  Please call me if any further GI concerns or questions.  We would like to thank you for the opportunity to participate in the care of [facility].    Dr Jonathon Bellows MD,MRCP [facility]) Gastroenterology/Hepatology Pager: [phone]

## 2018-06-26 ENCOUNTER — Inpatient Hospital Stay: Payer: Medicare Other | Admitting: Anesthesiology

## 2018-06-26 ENCOUNTER — Encounter
Admission: EM | Disposition: A | Discharge status: Skilled Nursing Facility | Payer: Self-pay | Source: Home / Self Care | Attending: Surgery

## 2018-06-26 HISTORY — PX: LAPAROSCOPIC RIGHT COLECTOMY: SHX5925

## 2018-06-26 LAB — SURGICAL PCR SCREEN
MRSA, PCR: NEGATIVE
Staphylococcus aureus: NEGATIVE

## 2018-06-26 SURGERY — COLECTOMY, RIGHT, LAPAROSCOPIC
Anesthesia: General | Site: Abdomen | Laterality: Right

## 2018-06-26 MED ORDER — PROPOFOL 10 MG/ML IV BOLUS
INTRAVENOUS | Status: DC | PRN
  Administered 2018-06-26: 80 mg via INTRAVENOUS

## 2018-06-26 MED ORDER — ONDANSETRON HCL 4 MG/2ML IJ SOLN
INTRAMUSCULAR | Status: AC
  Filled 2018-06-26: qty 2

## 2018-06-26 MED ORDER — BUPIVACAINE LIPOSOME 1.3 % IJ SUSP
INTRAMUSCULAR | Status: DC | PRN
  Administered 2018-06-26: 20 mL

## 2018-06-26 MED ORDER — KETOROLAC TROMETHAMINE 30 MG/ML IJ SOLN
15.0000 mg | Freq: Four times a day (QID) | INTRAMUSCULAR | Status: DC | PRN
  Administered 2018-06-29 – 2018-07-01 (×5): 15 mg via INTRAVENOUS
  Filled 2018-06-26 (×5): qty 1

## 2018-06-26 MED ORDER — BUPIVACAINE LIPOSOME 1.3 % IJ SUSP
INTRAMUSCULAR | Status: AC
  Filled 2018-06-26: qty 20

## 2018-06-26 MED ORDER — SUGAMMADEX SODIUM 200 MG/2ML IV SOLN
INTRAVENOUS | Status: DC | PRN
  Administered 2018-06-26: 100 mg via INTRAVENOUS

## 2018-06-26 MED ORDER — CHLORHEXIDINE GLUCONATE CLOTH 2 % EX PADS: 6.0000 | MEDICATED_PAD | Freq: Every day | CUTANEOUS | Status: DC

## 2018-06-26 MED ORDER — SODIUM CHLORIDE FLUSH 0.9 % IV SOLN
INTRAVENOUS | Status: AC
  Filled 2018-06-26: qty 10

## 2018-06-26 MED ORDER — CHLORHEXIDINE GLUCONATE CLOTH 2 % EX PADS
6.0000 | MEDICATED_PAD | Freq: Once | CUTANEOUS | Status: AC
  Administered 2018-06-26: 6 via TOPICAL

## 2018-06-26 MED ORDER — LIDOCAINE HCL (CARDIAC) PF 100 MG/5ML IV SOSY
PREFILLED_SYRINGE | INTRAVENOUS | Status: DC | PRN
  Administered 2018-06-26: 60 mg via INTRAVENOUS

## 2018-06-26 MED ORDER — BUPIVACAINE-EPINEPHRINE (PF) 0.5% -1:200000 IJ SOLN
INTRAMUSCULAR | Status: DC | PRN
  Administered 2018-06-26: 30 mL via PERINEURAL

## 2018-06-26 MED ORDER — ACETAMINOPHEN 10 MG/ML IV SOLN
INTRAVENOUS | Status: AC
  Filled 2018-06-26: qty 100

## 2018-06-26 MED ORDER — ONDANSETRON HCL 4 MG/2ML IJ SOLN
INTRAMUSCULAR | Status: DC | PRN
  Administered 2018-06-26: 4 mg via INTRAVENOUS

## 2018-06-26 MED ORDER — FENTANYL CITRATE (PF) 100 MCG/2ML IJ SOLN
INTRAMUSCULAR | Status: DC | PRN
  Administered 2018-06-26 (×2): 50 ug via INTRAVENOUS

## 2018-06-26 MED ORDER — ACETAMINOPHEN 10 MG/ML IV SOLN
INTRAVENOUS | Status: DC | PRN
  Administered 2018-06-26: 1000 mg via INTRAVENOUS

## 2018-06-26 MED ORDER — FENTANYL CITRATE (PF) 250 MCG/5ML IJ SOLN
INTRAMUSCULAR | Status: AC
  Filled 2018-06-26: qty 5

## 2018-06-26 MED ORDER — GLYCOPYRROLATE 0.2 MG/ML IJ SOLN
INTRAMUSCULAR | Status: DC | PRN
  Administered 2018-06-26: 0.2 mg via INTRAVENOUS

## 2018-06-26 MED ORDER — ENOXAPARIN SODIUM 30 MG/0.3ML ~~LOC~~ SOLN
30.0000 mg | SUBCUTANEOUS | Status: DC
  Administered 2018-06-27 – 2018-07-01 (×5): 30 mg via SUBCUTANEOUS
  Filled 2018-06-26 (×5): qty 0.3

## 2018-06-26 MED ORDER — SUGAMMADEX SODIUM 200 MG/2ML IV SOLN
INTRAVENOUS | Status: AC
  Filled 2018-06-26: qty 2

## 2018-06-26 MED ORDER — GLYCOPYRROLATE 0.2 MG/ML IJ SOLN
INTRAMUSCULAR | Status: AC
  Filled 2018-06-26: qty 1

## 2018-06-26 MED ORDER — ACETAMINOPHEN 500 MG PO TABS: 1000.0000 mg | ORAL_TABLET | Freq: Four times a day (QID) | ORAL | Status: DC | PRN

## 2018-06-26 MED ORDER — DEXAMETHASONE SODIUM PHOSPHATE 10 MG/ML IJ SOLN
INTRAMUSCULAR | Status: DC | PRN
  Administered 2018-06-26: 5 mg via INTRAVENOUS

## 2018-06-26 MED ORDER — FENTANYL CITRATE (PF) 100 MCG/2ML IJ SOLN: 25.0000 ug | INTRAMUSCULAR | Status: DC | PRN

## 2018-06-26 MED ORDER — HYDROMORPHONE HCL 1 MG/ML IJ SOLN
0.5000 mg | INTRAMUSCULAR | Status: DC | PRN
  Administered 2018-06-26 – 2018-07-01 (×9): 0.5 mg via INTRAVENOUS
  Filled 2018-06-26 (×9): qty 0.5

## 2018-06-26 MED ORDER — ROCURONIUM BROMIDE 100 MG/10ML IV SOLN
INTRAVENOUS | Status: DC | PRN
  Administered 2018-06-26 (×2): 10 mg via INTRAVENOUS
  Administered 2018-06-26: 30 mg via INTRAVENOUS

## 2018-06-26 MED ORDER — SODIUM CHLORIDE 0.9 % IJ SOLN
INTRAMUSCULAR | Status: DC | PRN
  Administered 2018-06-26: 10 mL via INTRAVENOUS

## 2018-06-26 MED ORDER — DEXAMETHASONE SODIUM PHOSPHATE 10 MG/ML IJ SOLN
INTRAMUSCULAR | Status: AC
  Filled 2018-06-26: qty 1

## 2018-06-26 MED ORDER — CHLORHEXIDINE GLUCONATE CLOTH 2 % EX PADS: 6.0000 | MEDICATED_PAD | Freq: Once | CUTANEOUS | Status: DC

## 2018-06-26 MED ORDER — SEVOFLURANE IN SOLN
RESPIRATORY_TRACT | Status: AC
  Filled 2018-06-26: qty 250

## 2018-06-26 MED ORDER — LACTATED RINGERS IV SOLN
INTRAVENOUS | Status: DC | PRN
  Administered 2018-06-26: 16:00:00 via INTRAVENOUS

## 2018-06-26 MED ORDER — PROPOFOL 10 MG/ML IV BOLUS
INTRAVENOUS | Status: AC
  Filled 2018-06-26: qty 20

## 2018-06-26 MED ORDER — BUPIVACAINE-EPINEPHRINE (PF) 0.5% -1:200000 IJ SOLN
INTRAMUSCULAR | Status: AC
  Filled 2018-06-26: qty 30

## 2018-06-26 MED ORDER — LIDOCAINE HCL (PF) 2 % IJ SOLN
INTRAMUSCULAR | Status: AC
  Filled 2018-06-26: qty 10

## 2018-06-26 SURGICAL SUPPLY — 57 items
"PENCIL ELECTRO HAND CTR " (MISCELLANEOUS) ×1 IMPLANT
BLADE SURG SZ10 CARB STEEL (BLADE) ×3 IMPLANT
CANISTER SUCT 1200ML W/VALVE (MISCELLANEOUS) ×3 IMPLANT
CHLORAPREP W/TINT 26ML (MISCELLANEOUS) ×3 IMPLANT
COVER LIGHT HANDLE STERIS (MISCELLANEOUS) ×2 IMPLANT
COVER WAND RF STERILE (DRAPES) ×3 IMPLANT
DECANTER SPIKE VIAL GLASS SM (MISCELLANEOUS) ×3 IMPLANT
DRSG OPSITE POSTOP 3X4 (GAUZE/BANDAGES/DRESSINGS) ×4 IMPLANT
DRSG OPSITE POSTOP 4X6 (GAUZE/BANDAGES/DRESSINGS) ×2 IMPLANT
ELECT CAUTERY BLADE 6.4 (BLADE) ×3 IMPLANT
ELECT REM PT RETURN 9FT ADLT (ELECTROSURGICAL) ×3
ELECTRODE REM PT RTRN 9FT ADLT (ELECTROSURGICAL) ×1 IMPLANT
GLOVE SURG SYN 7.0 (GLOVE) ×18 IMPLANT
GLOVE SURG SYN 7.0 PF PI (GLOVE) ×2 IMPLANT
GLOVE SURG SYN 7.5  E (GLOVE) ×8
GLOVE SURG SYN 7.5 E (GLOVE) ×4 IMPLANT
GLOVE SURG SYN 7.5 PF PI (GLOVE) ×2 IMPLANT
GOWN STRL REUS W/ TWL LRG LVL3 (GOWN DISPOSABLE) ×4 IMPLANT
GOWN STRL REUS W/TWL LRG LVL3 (GOWN DISPOSABLE) ×16
HANDLE YANKAUER SUCT BULB TIP (MISCELLANEOUS) ×3 IMPLANT
HOLDER FOLEY CATH W/STRAP (MISCELLANEOUS) ×3 IMPLANT
IRRIGATION STRYKERFLOW (MISCELLANEOUS) ×1 IMPLANT
IRRIGATOR STRYKERFLOW (MISCELLANEOUS) ×3
IV NS 1000ML (IV SOLUTION) ×2
IV NS 1000ML BAXH (IV SOLUTION) ×1 IMPLANT
KIT TURNOVER KIT A (KITS) ×3 IMPLANT
LABEL OR SOLS (LABEL) ×3 IMPLANT
LIGASURE LAP MARYLAND 5MM 37CM (ELECTROSURGICAL) ×3 IMPLANT
NEEDLE HYPO 22GX1.5 SAFETY (NEEDLE) ×3 IMPLANT
NS IRRIG 1000ML POUR BTL (IV SOLUTION) ×3 IMPLANT
PACK COLON CLEAN CLOSURE (MISCELLANEOUS) ×3 IMPLANT
PACK LAP CHOLECYSTECTOMY (MISCELLANEOUS) ×3 IMPLANT
PENCIL ELECTRO HAND CTR (MISCELLANEOUS) ×3 IMPLANT
RELOAD PROXIMATE 75MM BLUE (ENDOMECHANICALS) ×9 IMPLANT
RELOAD STAPLE 75 3.8 BLU REG (ENDOMECHANICALS) IMPLANT
RETRACTOR WOUND ALXS 18CM MED (MISCELLANEOUS) IMPLANT
RETRACTOR WOUND ALXS 18CM SML (MISCELLANEOUS) IMPLANT
RTRCTR WOUND ALEXIS O 18CM MED (MISCELLANEOUS)
RTRCTR WOUND ALEXIS O 18CM SML (MISCELLANEOUS) ×3
SLEEVE ADV FIXATION 5X100MM (TROCAR) ×9 IMPLANT
SPONGE LAP 18X18 RF (DISPOSABLE) ×4 IMPLANT
STAPLER PROXIMATE 75MM BLUE (STAPLE) ×2 IMPLANT
STAPLER SKIN PROX 35W (STAPLE) IMPLANT
SUT MNCRL 3-0 UNDYED SH (SUTURE) ×1 IMPLANT
SUT MONOCRYL 3-0 UNDYED (SUTURE) ×2
SUT PDS AB 1 CT1 36 (SUTURE) ×4 IMPLANT
SUT PDS AB 1 TP1 96 (SUTURE) ×6 IMPLANT
SUT SILK 2 0 (SUTURE) ×2
SUT SILK 2-0 (SUTURE) ×3 IMPLANT
SUT SILK 2-0 18XBRD TIE 12 (SUTURE) IMPLANT
SUT SILK 3-0 (SUTURE) ×7 IMPLANT
SUT VICRYL 0 AB UR-6 (SUTURE) ×3 IMPLANT
TRAY FOLEY MTR SLVR 16FR STAT (SET/KITS/TRAYS/PACK) ×3 IMPLANT
TROCAR BALLN GELPORT 12X130M (ENDOMECHANICALS) ×3 IMPLANT
TROCAR Z-THREAD FIOS 11X100 BL (TROCAR) IMPLANT
TROCAR Z-THREAD OPTICAL 5X100M (TROCAR) ×3 IMPLANT
TUBING INSUF HEATED (TUBING) ×3 IMPLANT

## 2018-06-26 NOTE — Anesthesia Postprocedure Evaluation (Signed)
Anesthesia Post Note  Patient: [name]  Procedure(s) Performed: LAPAROSCOPIC RIGHT COLECTOMY (Right Abdomen)  Patient location during evaluation: PACU Anesthesia Type: General Level of consciousness: awake and alert Pain management: pain level controlled Vital Signs Assessment: post-procedure vital signs reviewed and stable Respiratory status: spontaneous breathing, nonlabored ventilation, respiratory function stable and patient connected to nasal cannula oxygen Cardiovascular status: blood pressure returned to baseline and stable Postop Assessment: no apparent nausea or vomiting Anesthetic complications: no     Last Vitals:  Vitals:   06/26/18 2008 06/26/18 2043  BP: (!) 153/65 (!) 166/74  Pulse: 72 67  Resp: 20 16  Temp: (!) 36.4 C   SpO2: 100% 100%    Last Pain:  Vitals:   06/26/18 2008  TempSrc:   PainSc: 0-No pain                 Martha Clan

## 2018-06-26 NOTE — Interval H&P Note (Signed)
History and Physical Interval Note:  06/26/2018 3:20 PM  [name]  has presented today for surgery, with the diagnosis of n/a  The various methods of treatment have been discussed with the patient and family. After consideration of risks, benefits and other options for treatment, the patient has consented to  Procedure(s): LAPAROSCOPIC RIGHT COLECTOMY (Right) as a surgical intervention .  The patient's history has been reviewed, patient examined, no change in status, stable for surgery.  I have reviewed the patient's chart and labs.  Questions were answered to the patient's satisfaction.     Rocky Gladden

## 2018-06-26 NOTE — H&P (View-Only) (Signed)
06/26/2018  Subjective: Patient had bowel prep yesterday and had loose stools.  She's unsure if it was clear or not.  She did have one episode of emesis with the prep initially, due to drinking it too quickly, so a second bottle was ordered.  She is ready for surgery today.  Vital signs: Temp:  [97.6 F (36.4 C)-98.4 F (36.9 C)] 98.4 F (36.9 C) (10/24 1416) Pulse Rate:  [58-76] 76 (10/24 1223) Resp:  [15-20] 18 (10/24 1416) BP: (140-174)/(66-87) 174/77 (10/24 1416) SpO2:  [95 %-97 %] 97 % (10/24 1416) Weight:  [44.6 kg] 44.6 kg (10/24 0420)   Intake/Output: 10/23 0701 - 10/24 0700 In: 120 [P.O.:120] Out: -  Last BM Date: 06/25/18  Physical Exam: Constitutional: No acute distress Abdomen:  Soft, nondistended, nontender to palpation.  Labs:  Recent Labs    06/25/18 0345  WBC 7.4  HGB 11.4*  HCT 35.8*  PLT 229   Recent Labs    06/25/18 0345  NA 139  K 3.7  CL 108  CO2 25  GLUCOSE 91  BUN 10  CREATININE 1.00  CALCIUM 8.3*   Recent Labs    06/25/18 0345  LABPROT 14.1  INR 1.10    Imaging: No results found.  Assessment/Plan: This is a 84 y.o. female with adenocarcinoma of the hepatic flexure.  Will go to OR today for laparoscopic likely extended right colectomy.  All her questions have been answered.  Will transfer to our service post-op.   Melvyn Neth, [facility]

## 2018-06-26 NOTE — Progress Notes (Signed)
06/26/2018  Subjective: Patient had bowel prep yesterday and had loose stools.  She's unsure if it was clear or not.  She did have one episode of emesis with the prep initially, due to drinking it too quickly, so a second bottle was ordered.  She is ready for surgery today.  Vital signs: Temp:  [97.6 F (36.4 C)-98.4 F (36.9 C)] 98.4 F (36.9 C) (10/24 1416) Pulse Rate:  [58-76] 76 (10/24 1223) Resp:  [15-20] 18 (10/24 1416) BP: (140-174)/(66-87) 174/77 (10/24 1416) SpO2:  [95 %-97 %] 97 % (10/24 1416) Weight:  [44.6 kg] 44.6 kg (10/24 0420)   Intake/Output: 10/23 0701 - 10/24 0700 In: 120 [P.O.:120] Out: -  Last BM Date: 06/25/18  Physical Exam: Constitutional: No acute distress Abdomen:  Soft, nondistended, nontender to palpation.  Labs:  Recent Labs    06/25/18 0345  WBC 7.4  HGB 11.4*  HCT 35.8*  PLT 229   Recent Labs    06/25/18 0345  NA 139  K 3.7  CL 108  CO2 25  GLUCOSE 91  BUN 10  CREATININE 1.00  CALCIUM 8.3*   Recent Labs    06/25/18 0345  LABPROT 14.1  INR 1.10    Imaging: No results found.  Assessment/Plan: This is a 84 y.o. female with adenocarcinoma of the hepatic flexure.  Will go to OR today for laparoscopic likely extended right colectomy.  All her questions have been answered.  Will transfer to our service post-op.   Melvyn Neth, [facility]

## 2018-06-26 NOTE — Anesthesia Preprocedure Evaluation (Addendum)
Anesthesia Evaluation  Patient identified by MRN, date of birth, ID band Patient awake    Reviewed: Allergy & Precautions, H&P , NPO status , Patient's Chart, lab work & pertinent test results  Airway Mallampati: II   Neck ROM: full    Dental  (+) Edentulous Upper, Edentulous Lower, Upper Dentures, Lower Dentures   Pulmonary COPD (wears O2 when sleeping or when subjectively SOB),  oxygen dependent, former smoker,    breath sounds clear to auscultation + decreased breath sounds      Cardiovascular Exercise Tolerance: Good hypertension, Pt. on medications + Peripheral Vascular Disease  + dysrhythmias Atrial Fibrillation and Supra Ventricular Tachycardia  Rate:Normal  Pt has good exercise tolerance; goes dancing  Echo 04/24/18: Left ventricle: The cavity size was normal. Systolic function was   normal. The estimated ejection fraction was in the range of 60%   to 65%. Wall motion was normal; there were no regional wall   motion abnormalities. Doppler parameters are consistent with   abnormal left ventricular relaxation (grade 1 diastolic   dysfunction). - Aortic valve: There was mild regurgitation. - Left atrium: The atrium was normal in size. - Right ventricle: Systolic function was normal. - Tricuspid valve: There was mild-moderate regurgitation. - Pulmonary arteries: Systolic pressure was mildly elevated. PA   peak pressure: 43 mm Hg (S).   Neuro/Psych PSYCHIATRIC DISORDERS Anxiety Depression negative neurological ROS  negative psych ROS   GI/Hepatic Neg liver ROS, Malignant neoplasm of colon   Endo/Other  Hypothyroidism   Renal/GU      Musculoskeletal  (+) Arthritis ,   Abdominal   Peds  Hematology negative hematology ROS (+)   Anesthesia Other Findings Past Medical History: No date: Anxiety No date: Atrial fibrillation (HCC) No date: Clotting disorder ([place])     Comment:  left ankle No date: COPD (chronic  obstructive pulmonary disease) (HCC) No date: Depression No date: Overactive bladder No date: Thyroid disease  Past Surgical History: No date: ABDOMINAL HYSTERECTOMY No date: BREAST LUMPECTOMY 06/23/2018: COLONOSCOPY; Left     Comment:  Procedure: COLONOSCOPY;  Surgeon: Jonathon Bellows, MD;                Location: [facility] ENDOSCOPY;  Service: Gastroenterology;                Laterality: Left;  Colonoscopy first. Dr. Vicente Males will               decide if EGD is needed after colonoscopy 06/23/2018: ESOPHAGOGASTRODUODENOSCOPY; Left     Comment:  Procedure: ESOPHAGOGASTRODUODENOSCOPY (EGD);  Surgeon:               Jonathon Bellows, MD;  Location: [facility] ENDOSCOPY;  Service:               Gastroenterology;  Laterality: Left;  Colonoscopy first.               Dr. Vicente Males will decide if EGD is needed after colonoscopy  BMI    Body Mass Index:  19.22 kg/m      Reproductive/Obstetrics negative OB ROS                           Anesthesia Physical Anesthesia Plan  ASA: III  Anesthesia Plan: General ETT   Post-op Pain Management:    Induction:   PONV Risk Score and Plan: Ondansetron and Dexamethasone  Airway Management Planned:   Additional Equipment:   Intra-op Plan:   Post-operative Plan:  Informed Consent: I have reviewed the patients History and Physical, chart, labs and discussed the procedure including the risks, benefits and alternatives for the proposed anesthesia with the patient or authorized representative who has indicated his/her understanding and acceptance.   Dental Advisory Given  Plan Discussed with: Anesthesiologist, CRNA and Surgeon  Anesthesia Plan Comments:         Anesthesia Quick Evaluation

## 2018-06-26 NOTE — Progress Notes (Signed)
Anticoagulation monitoring(Lovenox):  84 yo female ordered Lovenox 40 mg Q24h  Filed Weights   06/24/18 0500 06/25/18 0412 06/26/18 0420  Weight: 98 lb 4.8 oz (44.6 kg) 96 lb 9.6 oz (43.8 kg) 98 lb 6.4 oz (44.6 kg)   BMI    Lab Results  Component Value Date   CREATININE 1.00 06/25/2018   CREATININE 1.29 (H) 06/22/2018   CREATININE 1.14 (H) 06/19/2018   Estimated Creatinine Clearance: 27.4 mL/min (by C-G formula based on SCr of 1 mg/dL). Hemoglobin & Hematocrit     Component Value Date/Time   HGB 11.4 (L) 06/25/2018 0345   HGB 12.5 06/19/2018 1354   HCT 35.8 (L) 06/25/2018 0345   HCT 36.7 06/19/2018 1354     Per Protocol for Patient with estCrcl < 30 ml/min and BMI < 40, will transition to Lovenox 30 mg Q24h.

## 2018-06-26 NOTE — Progress Notes (Signed)
[place] at [facility]                                                                                                                                                                                  Patient Demographics   [name], is a 84 y.o. female, DOB - [birth year], JHE:[id number]  Admit date - 06/22/2018   Admitting Physician Harrie Foreman, MD  Outpatient Primary MD for the patient is Valerie Roys, DO   LOS - 2  Subjective: Patient will be having her surgery later today denies any complaints currently   Review of Systems:   CONSTITUTIONAL: No documented fever. No fatigue, weakness. No weight gain, no weight loss.  EYES: No blurry or double vision.  ENT: No tinnitus. No postnasal drip. No redness of the oropharynx.  RESPIRATORY: No cough, no wheeze, no hemoptysis. No dyspnea.  CARDIOVASCULAR: No chest pain. No orthopnea. No palpitations. No syncope.  GASTROINTESTINAL: No nausea, no vomiting or diarrhea. No abdominal pain.  Positive melena or hematochezia.  GENITOURINARY: No dysuria or hematuria.  ENDOCRINE: No polyuria or nocturia. No heat or cold intolerance.  HEMATOLOGY: No anemia. No bruising. No bleeding.  INTEGUMENTARY: No rashes. No lesions.  MUSCULOSKELETAL: No arthritis. No swelling. No gout.  NEUROLOGIC: No numbness, tingling, or ataxia. No seizure-type activity.  PSYCHIATRIC: No anxiety. No insomnia. No ADD.    Vitals:   Vitals:   06/25/18 2130 06/26/18 0420 06/26/18 1027 06/26/18 1223  BP:  (!) 164/66 140/87 (!) 157/66  Pulse: 72 69 72 76  Resp:  15 18 20   Temp:  98 F (36.7 C) 98 F (36.7 C) 97.9 F (36.6 C)  TempSrc:  Oral Oral Oral  SpO2:  96% 97% 95%  Weight:  44.6 kg    Height:        Wt Readings from Last 3 Encounters:  06/26/18 44.6 kg  06/19/18 42.2 kg  05/19/18 42.2 kg     Intake/Output Summary (Last 24 hours) at 06/26/2018 1344 Last data filed at 06/25/2018 2355 Gross per 24 hour  Intake  0 ml  Output -  Net 0 ml    Physical Exam:   GENERAL: Pleasant-appearing in no apparent distress.  HEAD, EYES, EARS, NOSE AND THROAT: Atraumatic, normocephalic. Extraocular muscles are intact. Pupils equal and reactive to light. Sclerae anicteric. No conjunctival injection. No oro-pharyngeal erythema.  NECK: Supple. There is no jugular venous distention. No bruits, no lymphadenopathy, no thyromegaly.  HEART: Regular rate and rhythm,. No murmurs, no rubs, no clicks.  LUNGS: Clear to auscultation bilaterally. No rales or rhonchi. No wheezes.  ABDOMEN: Soft, flat, nontender, nondistended. Has good bowel sounds. No hepatosplenomegaly  appreciated.  EXTREMITIES: No evidence of any cyanosis, clubbing, or peripheral edema.  +2 pedal and radial pulses bilaterally.  NEUROLOGIC: The patient is alert, awake, and oriented x3 with no focal motor or sensory deficits appreciated bilaterally.  SKIN: Moist and warm with no rashes appreciated.  Psych: Not anxious, depressed LN: No inguinal LN enlargement    Antibiotics   Anti-infectives (From admission, onward)   Start     Dose/Rate Route Frequency Ordered Stop   06/26/18 0600  cefoTEtan (CEFOTAN) 2 g in sodium chloride 0.9 % 100 mL IVPB     2 g 200 mL/hr over 30 Minutes Intravenous On call to O.R. 06/25/18 1154 06/27/18 0559   06/25/18 1200  neomycin (MYCIFRADIN) tablet 1,000 mg     1,000 mg Oral Every 6 hours 06/25/18 1154 06/25/18 2340   06/25/18 1200  erythromycin (ERY-TAB) EC tablet 1,000 mg     1,000 mg Oral Every 6 hours 06/25/18 1154 06/26/18 0559      Medications   Scheduled Meds: . amLODipine  2.5 mg Oral Daily  . docusate sodium  100 mg Oral BID  . ferrous sulfate  325 mg Oral TID WC  . fluticasone furoate-vilanterol  1 puff Inhalation Daily  . levothyroxine  100 mcg Oral QAC breakfast  . metoprolol tartrate  50 mg Oral BID  . pantoprazole (PROTONIX) IV  40 mg Intravenous Q12H   Continuous Infusions: . cefoTEtan (CEFOTAN) IV     . lactated ringers 75 mL/hr at 06/25/18 2355   PRN Meds:.acetaminophen **OR** acetaminophen, ondansetron **OR** ondansetron (ZOFRAN) IV   Data Review:   Micro Results Recent Results (from the past 240 hour(s))  Surgical PCR screen     Status: None   Collection Time: 06/26/18  1:27 AM  Result Value Ref Range Status   MRSA, PCR NEGATIVE NEGATIVE Final   Staphylococcus aureus NEGATIVE NEGATIVE Final    Comment: (NOTE) The Xpert SA Assay (FDA approved for NASAL specimens in patients 48 years of age and older), is one component of a comprehensive surveillance program. It is not intended to diagnose infection nor to guide or monitor treatment. Performed at [facility], [address], [place], [place] [address]     Radiology Reports Ct Chest Wo Contrast  Result Date: 06/23/2018 CLINICAL DATA:  Past medical history of atrial fibrillation, COPD and intestinal perforation. Presents to the emergency department with dark tarry stools. CT of abdomen revealed a mass at the appendix flexure. Staging workup. EXAM: CT CHEST WITHOUT CONTRAST TECHNIQUE: Multidetector CT imaging of the chest was performed following the standard protocol without IV contrast. COMPARISON:  None. FINDINGS: Cardiovascular: Aortic atherosclerosis. No thoracic aortic aneurysm. Heart size is within normal limits. No pericardial effusion. Three-vessel coronary artery calcifications. Mediastinum/Nodes: No mass or enlarged lymph nodes seen within the mediastinum or perihilar regions. Esophagus is unremarkable. Trachea is unremarkable. Lungs/Pleura: No pulmonary nodule or mass. Diffuse emphysematous change, moderate to severe in degree, upper lobe predominant. Associated mild scarring/fibrosis at the lung apices. No pneumonia, pleural effusion or pulmonary edema. Upper Abdomen: Limited images of the upper abdomen are unremarkable. Musculoskeletal: Mild degenerative spondylosis throughout the scoliotic thoracolumbar spine.  No acute or suspicious osseous finding. IMPRESSION: 1. No evidence of metastatic disease within the chest. 2. No acute findings within the chest. 3. Emphysema, moderate to severe in degree. 4. Aortic atherosclerosis. Particularly advanced atherosclerosis of the lower descending thoracic aorta and upper abdominal aorta. 5. Three-vessel coronary artery calcifications. Heart size is normal. Aortic Atherosclerosis (ICD10-I70.0) and  Emphysema (ICD10-J43.9). Electronically Signed   By: Franki Cabot M.D.   On: 06/23/2018 15:12   Ct Angio Abd/pel W And/or Wo Contrast  Result Date: 06/22/2018 CLINICAL DATA:  GI bleed. EXAM: CTA ABDOMEN AND PELVIS wITHOUT AND WITH CONTRAST TECHNIQUE: Multidetector CT imaging of the abdomen and pelvis was performed using the standard protocol during bolus administration of intravenous contrast. Multiplanar reconstructed images and MIPs were obtained and reviewed to evaluate the vascular anatomy. CONTRAST:  158mL ISOVUE-370 IOPAMIDOL (ISOVUE-370) INJECTION 76% COMPARISON:  None. FINDINGS: VASCULAR Aorta: Diffuse dense irregular calcified and noncalcified atheromatous plaque, coarse plaque causes approximately 50% luminal narrowing of the upper abdominal aorta just proximal to the SMA. No aneurysm. No evidence of vasculitis. Celiac: Severe stenosis at the origin due to calcified and noncalcified plaque. Early branching of right and left hepatic arteries versus aortic origin of the right hepatic artery. SMA: Patent without evidence of aneurysm, dissection, vasculitis or significant stenosis. Renals: Plaque at the origin of the right renal artery causes severe stenosis with near complete occlusion, tiny vessel into an atrophic right kidney. Moderate plaque at the origin of the left renal artery causes approximately 50% stenosis. IMA: Patent without evidence of aneurysm, dissection, vasculitis or significant stenosis. Inflow: Moderate plaque that evidence of aneurysm, dissection, vasculitis  or significant stenosis. Proximal Outflow: Bilateral common femoral and visualized portions of the superficial and profunda femoral arteries are patent without evidence of aneurysm, dissection, vasculitis or significant stenosis. Veins: Portal and mesenteric veins appear patent and venous phase imaging. Iliac veins and IVC are unremarkable. Review of the MIP images confirms the above findings. NON-VASCULAR Lower chest: Emphysema without consolidation or pleural effusion. Scattered atelectasis. There are coronary artery calcifications. Small hiatal hernia. Hepatobiliary: No focal liver abnormality is seen. No gallstones, gallbladder wall thickening, or biliary dilatation. Pancreas: Atrophic parenchyma. No ductal dilatation or inflammation. Spleen: Normal in size without focal abnormality. Adrenals/Urinary Tract: No adrenal nodule. Marked right renal atrophy, right kidney is poorly perfused. 11 mm cyst in the mid right kidney. No left hydronephrosis or perinephric edema. Urinary bladder is partially distended. No bladder wall thickening. Stomach/Bowel: Small hiatal hernia. Stomach is nondistended. There is no contrast extravasation into the GI tract to localize site of GI bleed. Wall thickening of the cecum and ascending colon, with masslike thickening at the hepatic flexure, for example image 87 series 7. This area of masslike thickening spans at least 5 cm. No pericolonic stranding. No small bowel dilatation or obstruction. Moderate stool in the distal colon. Distal colonic diverticulosis without diverticulitis. Appendix not definitively visualized. Lymphatic: No enlarged abdominal or pelvic lymph nodes. Reproductive: Status post hysterectomy. No adnexal masses. Other: No free air or ascites. Musculoskeletal: Scoliotic curvature and multilevel degenerative change in the spine. There are no acute or suspicious osseous abnormalities. IMPRESSION: VASCULAR 1. No active extravasation into the GI tract to localize site of  GI bleed. 2. Diffuse calcified and noncalcified atheromatous plaque throughout the abdominal aorta, plaque causes approximately 50% narrowing of the upper abdominal aorta at the level of the SMA. 3. 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 Hepatic or right hepatic artery likely rises directly from the aorta, normal variant anatomy. 4. High-grade stenosis at the origin of the right renal artery with associated right renal atrophy and poor perfusion of the right kidney. NON-VASCULAR 1. Colonic wall thickening of the cecum and ascending colon, with masslike thickening at the hepatic flexure. Hepatic flexure findings are suspicious for colonic  malignancy, with possible ascending colitis. Recommend direct visualization with colonoscopy. 2. Distal colonic diverticulosis without diverticulitis. Aortic Atherosclerosis (ICD10-I70.0) and Emphysema (ICD10-J43.9). Electronically Signed   By: Keith Rake M.D.   On: 06/22/2018 06:04     CBC Recent Labs  Lab 06/19/18 1354 06/22/18 0424 06/22/18 1200 06/25/18 0345  WBC 9.3 7.7  --  7.4  HGB 12.5 11.9* 12.3 11.4*  HCT 36.7 37.7  --  35.8*  PLT 260 244  --  229  MCV 86 86.9  --  86.7  MCH 29.3 27.4  --  27.6  MCHC 34.1 31.6  --  31.8  RDW 14.3 13.5  --  13.8  LYMPHSABS 2.7 2.0  --  2.5  MONOABS  --  0.7  --  0.7  EOSABS  --  0.1  --  0.2  BASOSABS  --  0.1  --  0.0    Chemistries  Recent Labs  Lab 06/19/18 1520 06/22/18 0424 06/25/18 0345  NA 138 139 139  K 4.9 4.1 3.7  CL 100 105 108  CO2 24 27 25   GLUCOSE 90 111* 91  BUN 22 18 10   CREATININE 1.14* 1.29* 1.00  CALCIUM 9.2 8.8* 8.3*  AST  --  21 17  ALT  --  10 8  ALKPHOS  --  94 72  BILITOT  --  0.4 0.6   ------------------------------------------------------------------------------------------------------------------ estimated creatinine clearance is 27.4 mL/min (by C-G formula based on SCr of 1  mg/dL). ------------------------------------------------------------------------------------------------------------------ No results for input(s): HGBA1C in the last 72 hours. ------------------------------------------------------------------------------------------------------------------ No results for input(s): CHOL, HDL, LDLCALC, TRIG, CHOLHDL, LDLDIRECT in the last 72 hours. ------------------------------------------------------------------------------------------------------------------ No results for input(s): TSH, T4TOTAL, T3FREE, THYROIDAB in the last 72 hours.  Invalid input(s): FREET3 ------------------------------------------------------------------------------------------------------------------ No results for input(s): VITAMINB12, FOLATE, FERRITIN, TIBC, IRON, RETICCTPCT in the last 72 hours.  Coagulation profile Recent Labs  Lab 06/25/18 0345  INR 1.10    No results for input(s): DDIMER in the last 72 hours.  Cardiac Enzymes No results for input(s): CKMB, TROPONINI, MYOGLOBIN in the last 168 hours.  Invalid input(s): CK ------------------------------------------------------------------------------------------------------------------ Invalid input(s): [place]   This is a an 84 year old female admitted for new colon mass. 1.  Stricture at the hepatic flexure pathology positive for adenocarcinoma surgery later today 2.  Melena: Hemoglobin stable  3.  Atrial fibrillation: Controlled; continue metoprolol.  Continue to hold aspirin 4.  Hypothyroidism:  continue Synthroid 5.  COPD: Stable; continue inhaled corticosteroid and long-acting bronchial agonist evidence exasperation 6.  DVT prophylaxis: SCDs 7.  GI prophylaxis: PPI      Code Status Orders  (From admission, onward)         Start     Ordered   06/22/18 1035  Full code  Continuous     06/22/18 1034        Code Status History    This patient has a current code status but no  historical code status.    Advance Directive Documentation     Most Recent Value  Type of Advance Directive  Living will  Pre-existing out of facility DNR order (yellow form or pink MOST form)  -  "MOST" Form in Place?  -           Consults gastroenterology  DVT Prophylaxis SCD  Lab Results  Component Value Date   PLT 229 06/25/2018     Time Spent in minutes 35 minutes  greater than 50% of time spent in  care coordination and counseling patient regarding the condition and plan of care.   Dustin Flock M.D on 06/26/2018 at 1:44 PM  Between 7am to 6pm - Pager - [phone]  After 6pm go to [email or web address] - Proofreader  [employer]   Office  [phone]

## 2018-06-26 NOTE — Anesthesia Procedure Notes (Signed)
Procedure Name: Intubation Date/Time: 06/26/2018 1:51 PM Performed by: Dionne Bucy, CRNA Pre-anesthesia Checklist: Patient identified, Patient being monitored, Timeout performed, Emergency Drugs available and Suction available Patient Re-evaluated:Patient Re-evaluated prior to induction Oxygen Delivery Method: Circle system utilized Preoxygenation: Pre-oxygenation with 100% oxygen Induction Type: IV induction Ventilation: Mask ventilation without difficulty Laryngoscope Size: Mac and 3 Grade View: Grade I Tube type: Oral Tube size: 6.5 mm Number of attempts: 1 Airway Equipment and Method: Stylet Placement Confirmation: ETT inserted through vocal cords under direct vision,  positive ETCO2 and breath sounds checked- equal and bilateral Secured at: 20 cm Tube secured with: Tape Dental Injury: Teeth and Oropharynx as per pre-operative assessment

## 2018-06-26 NOTE — Transfer of Care (Signed)
Immediate Anesthesia Transfer of Care Note  Patient: [name]  Procedure(s) Performed: LAPAROSCOPIC RIGHT COLECTOMY (Right Abdomen)  Patient Location: PACU  Anesthesia Type:General  Level of Consciousness: drowsy  Airway & Oxygen Therapy: Patient connected to face mask oxygen  Post-op Assessment: Post -op Vital signs reviewed and stable  Post vital signs: stable  Last Vitals:  Vitals Value Taken Time  BP 149/61 06/26/2018  7:23 PM  Temp 36.6 C 06/26/2018  7:23 PM  Pulse 77 06/26/2018  7:23 PM  Resp 17 06/26/2018  7:23 PM  SpO2 98 % 06/26/2018  7:23 PM    Last Pain:  Vitals:   06/26/18 1923  TempSrc: Temporal  PainSc:          Complications: No apparent anesthesia complications

## 2018-06-26 NOTE — Anesthesia Post-op Follow-up Note (Signed)
Anesthesia QCDR form completed.        

## 2018-06-26 NOTE — Op Note (Signed)
Procedure Date:  06/26/2018  Pre-operative Diagnosis:  Adenocarcinoma of hepatic flexure  Post-operative Diagnosis:  Adenocarcinoma of hepatic flexure  Procedure:  Laparoscopic Extended Right Colectomy  Surgeon:  Melvyn Neth, MD  Assistant:  Nestor Lewandowsky, MD.  Anesthesia:  General endotracheal  Estimated Blood Loss:  25 ml  Specimens:  Right colon with terminal ileum, middle colic bundle  Complications:  None  Indications for Procedure:  This is a 84 y.o. female who presents with GI bleed and diagnosis of adenocarcinoma of the hepatic flexure.  The benefits, complications, treatment options, and expected outcomes were discussed with the patient. The risks of bleeding, infection, bowel injury, and need for further procedures were all discussed with the patient and she was willing to proceed.  Description of Procedure: The patient was correctly identified in the preoperative area and brought into the operating room.  The patient was placed supine with VTE prophylaxis in place.  Appropriate time-outs were performed.  Anesthesia was induced and the patient was intubated.  Foley catheter was placed.  Appropriate antibiotics were infused.  The abdomen was prepped and draped in a sterile fashion. An infraumbilical incision was made. A cutdown technique was used to enter the abdominal cavity without injury, and a Hasson trocar was inserted.  Pneumoperitoneum was obtained with appropriate opening pressures.  Two 5-mm ports were placed in the right lower quadrant and left lower quadrant under direct visualization.    We started with inspection of the abdominal cavity and there was no evidence of any extracolonic disease.  There were no peritoneal implants, no enlarged masses, and no masses on the liver.  The tattooed portion of the right colon was visualized.  We started mobilizing the right colon along the white line of Toldt including portion of the terminal ileum. The right ovary and  right ureter were visualized and kept intact.  We carried the dissection and mobilization of the colon to the hepatic flexure.  Then we started mobilizing the hepatic flexure off the attachments that it had to the liver and gallbladder, mobilizing it off the omentum and lesser sac.  A combination of LigaSure and blunt dissection was used for all this mobilization.  We were able to visualize the duodenum and prevent any injury.  Once we had appropriate mobilization past the middle colic bundle, we deflated the pneumoperitoneum and removed the [facility] trocar.  We extended the incision made for that trocar superiorly to make an incision measuring about 5-6 cm in length.  We used cautery to dissect down and extend the fascial incision as well.  We put in a small wound protector through this incision and were able to pull out the colon and terminal ileum.    We picked an area in the transverse colon beyond the area of supply of the middle colic.  Using a hemostat we created a window in the mesentery of the transverse colon and used a 5 mm blue load stapler to staple the colon.  We then went to the terminal ileum to an area proximal to the supply of the ileocolic branches and created a window in the mesentery using a hemostat and fired a stapler in same fashion.  Then using LigaSure we were able to take down the mesentery from the terminal ileum to the transverse colon, using 2-0 silk ties for the ileocolic bundle.  It was noted that the middle colic bundle was cauterized too distally, and a new specimen was taken with the middle colic bundle down to its  origin.  The stump was ligated with 2-0 silk ties. The specimen then came off en bloc including terminal ileum, right colon, and mid transverse colon.  This was sent out to pathology.  Then the blind ends of our staple lines were put together and lined up at the antimesenteric borders and secured in place using 3-0 silk suture.  Corners of the staple lines were cut using  Mayo scissors and a GIA 75 mm blue load stapler was used to create a common channel between terminal ileum and transverse colon.  The same stapler was then used to close the opening of the common channel.  3-0 silk sutures were used to imbricate the staple line for protection.  Also a 3-0 silk suture was used to close the mesenteric defect.  The intestines were then placed back into the abdomen and the wound protector was clamped and pneumoperitoneum was started again.  Were able to inspect the abdominal cavity and make sure that there were no twists within the bowel and the mesentery.  We then inspected the abdomen again assuring hemostasis on the right side.  At that point all the ports were removed and the wound protector was removed and pneumoperitoneum was released.  60 mL total of Exparel solution was then infiltrated in the peritoneum and fascia of the biggest incision as well as subcutaneously over the 2 remaining port sites.  The midline incision was then closed using #1 PDS suture x2.  All the wounds were then irrigated and closed using stapler.  All the wounds were then cleaned and dressed using honeycomb dressings.   The patient was emerged from anesthesia and extubated and brought to the recovery room for further management.   The patient tolerated the procedure well and all counts were correct at the end of the case.    Melvyn Neth, MD

## 2018-06-27 ENCOUNTER — Encounter: Payer: Self-pay | Admitting: Surgery

## 2018-06-27 LAB — BASIC METABOLIC PANEL
Anion gap: 10 (ref 5–15)
BUN: 9 mg/dL (ref 8–23)
CO2: 24 mmol/L (ref 22–32)
Calcium: 8.7 mg/dL — ABNORMAL LOW (ref 8.9–10.3)
Chloride: 98 mmol/L (ref 98–111)
Creatinine, Ser: 0.97 mg/dL (ref 0.44–1.00)
GFR calc Af Amer: 59 mL/min — ABNORMAL LOW (ref >60)
GFR calc non Af Amer: 51 mL/min — ABNORMAL LOW (ref >60)
Glucose, Bld: 149 mg/dL — ABNORMAL HIGH (ref 70–99)
Potassium: 3.9 mmol/L (ref 3.5–5.1)
Sodium: 132 mmol/L — ABNORMAL LOW (ref 135–145)

## 2018-06-27 LAB — CBC WITH DIFFERENTIAL/PLATELET
Abs Immature Granulocytes: 0.19 10*3/uL — ABNORMAL HIGH (ref 0.00–0.07)
Basophils Absolute: 0.1 10*3/uL (ref 0.0–0.1)
Basophils Relative: 0 %
Eosinophils Absolute: 0 10*3/uL (ref 0.0–0.5)
Eosinophils Relative: 0 %
HCT: 41.3 % (ref 36.0–46.0)
Hemoglobin: 13.5 g/dL (ref 12.0–15.0)
Immature Granulocytes: 1 %
Lymphocytes Relative: 6 %
Lymphs Abs: 1.4 10*3/uL (ref 0.7–4.0)
MCH: 27.7 pg (ref 26.0–34.0)
MCHC: 32.7 g/dL (ref 30.0–36.0)
MCV: 84.8 fL (ref 80.0–100.0)
Monocytes Absolute: 0.6 10*3/uL (ref 0.1–1.0)
Monocytes Relative: 3 %
Neutro Abs: 19.5 10*3/uL — ABNORMAL HIGH (ref 1.7–7.7)
Neutrophils Relative %: 90 %
Platelets: 284 10*3/uL (ref 150–400)
RBC: 4.87 MIL/uL (ref 3.87–5.11)
RDW: 13.5 % (ref 11.5–15.5)
WBC: 21.8 10*3/uL — ABNORMAL HIGH (ref 4.0–10.5)
nRBC: 0 % (ref 0.0–0.2)

## 2018-06-27 MED ORDER — FAMOTIDINE 20 MG PO TABS
20.0000 mg | ORAL_TABLET | Freq: Every day | ORAL | Status: DC
  Administered 2018-06-28 – 2018-07-01 (×4): 20 mg via ORAL
  Filled 2018-06-27 (×4): qty 1

## 2018-06-27 MED ORDER — HYDRALAZINE HCL 20 MG/ML IJ SOLN
5.0000 mg | Freq: Once | INTRAMUSCULAR | Status: AC
  Administered 2018-06-27: 5 mg via INTRAVENOUS
  Filled 2018-06-27: qty 1

## 2018-06-27 MED ORDER — AMLODIPINE BESYLATE 5 MG PO TABS
2.5000 mg | ORAL_TABLET | Freq: Once | ORAL | Status: AC
  Administered 2018-06-27: 2.5 mg via ORAL
  Filled 2018-06-27: qty 1

## 2018-06-27 NOTE — Evaluation (Signed)
Physical Therapy Evaluation Patient Details Name: [name] MRN: [id number] DOB: Feb 02, 1928 Today's Date: 06/27/2018   History of Present Illness  Pt is an 84 year old female who presented with melena/GI bleed and was diagnosed with adenocarcinoma of the hepatic flexure.  Pt is now s/p laparoscopic extended right colectomy.  PMH also includes: COPD, aortic atherosclerosis, A-fib, hypothyroidism, and CAD.    Clinical Impression  Pt presents with deficits in strength, transfers, mobility, gait, balance, and activity tolerance.  Pt required extensive assistance with bed mobility tasks with log roll technique training provided.  Pt required min A with transfers along with heavy cuing for proper sequencing.  Upon standing pt was fearful of falling but reported no adverse symptoms other than abdominal pain.  Pt was able to take only several very small steps at the EOB with assist needed to advance the RW.  Pt declined to sit in the chair and began to c/o nausea once returned to sitting at the EOB with nursing notified.  Pt will benefit from PT services in a SNF setting upon discharge to safely address above deficits for decreased caregiver assistance and eventual return to PLOF.      Follow Up Recommendations SNF    Equipment Recommendations  None recommended by PT    Recommendations for Other Services       Precautions / Restrictions Precautions Precautions: Fall Precaution Comments: Abdominal incisions Restrictions Weight Bearing Restrictions: No      Mobility  Bed Mobility Overal bed mobility: Needs Assistance Bed Mobility: Supine to Sit;Sit to Supine     Supine to sit: Mod assist Sit to supine: Mod assist   General bed mobility comments: Log roll technique education provided with pt requiring extensive assistance for BLEs in and out of the bed and for trunk to full upright position  Transfers Overall transfer level: Needs assistance Equipment used: Rolling walker (2  wheeled) Transfers: Sit to/from Stand Sit to Stand: Min assist;From elevated surface         General transfer comment: Min A to stand with mod verbal cues for hand placement and general sequencing  Ambulation/Gait Ambulation/Gait assistance: Min assist Gait Distance (Feet): 2 Feet Assistive device: Rolling walker (2 wheeled) Gait Pattern/deviations: Step-to pattern;Decreased step length - right;Decreased step length - left;Trunk flexed Gait velocity: Decreased   General Gait Details: Pt limited to only several very small steps at the EOB with significant fear of falling throughout  Stairs            Wheelchair Mobility    Modified Rankin (Stroke Patients Only)       Balance Overall balance assessment: Mild deficits observed, not formally tested                                           Pertinent Vitals/Pain Pain Assessment: 0-10 Pain Score: 7  Pain Location: abdominal area Pain Descriptors / Indicators: Aching;Operative site guarding;Sore Pain Intervention(s): Limited activity within patient's tolerance;Monitored during session;Patient requesting pain meds-RN notified    Home Living Family/patient expects to be discharged to:: Private residence Living Arrangements: Alone Available Help at Discharge: Family;Available PRN/intermittently Type of Home: Apartment Home Access: Elevator     Home Layout: One level Home Equipment: Walker - 2 wheels;Cane - single point      Prior Function Level of Independence: Independent         Comments: Ind amb without an AD,  no fall history, Ind with ADLs, drives, 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     Hand Dominance        Extremity/Trunk Assessment   Upper Extremity Assessment Upper Extremity Assessment: RUE deficits/detail RUE Deficits / Details: Limited mobility to R shoulder secondry to recent injection she received RUE: Unable to fully assess due to pain     Lower Extremity Assessment Lower Extremity Assessment: Generalized weakness;LLE deficits/detail;RLE deficits/detail RLE Deficits / Details: R hip flex <3/5, limited by abdominal pain RLE: Unable to fully assess due to pain LLE Deficits / Details: L hip flex <3/5, limited by abdominal pain LLE: Unable to fully assess due to pain       Communication   Communication: HOH  Cognition Arousal/Alertness: Awake/alert Behavior During Therapy: WFL for tasks assessed/performed Overall Cognitive Status: Within Functional Limits for tasks assessed                                        General Comments      Exercises Total Joint Exercises Ankle Circles/Pumps: AROM;Both;10 reps Quad Sets: Strengthening;Both;10 reps Gluteal Sets: Strengthening;Both;10 reps Heel Slides: AAROM;Both;5 reps Long Arc Quad: AROM;Both;10 reps Knee Flexion: AROM;Both;10 reps Marching in Standing: AROM;Both;5 reps Other Exercises Other Exercises: HEP education for BLE APs, QS, and GS x 10 each 5x/day   Assessment/Plan    PT Assessment Patient needs continued PT services  PT Problem List Decreased strength;Decreased activity tolerance;Decreased balance;Decreased knowledge of use of DME;Decreased mobility       PT Treatment Interventions DME instruction;Gait training;Functional mobility training;Balance training;Therapeutic exercise;Therapeutic activities;Patient/family education    PT Goals (Current goals can be found in the Care Plan section)  Acute Rehab PT Goals Patient Stated Goal: To walk better PT Goal Formulation: With patient Time For Goal Achievement: 07/10/18 Potential to Achieve Goals: Good    Frequency Min 2X/week   Barriers to discharge Decreased caregiver support      Co-evaluation               AM-PAC PT "6 Clicks" Daily Activity  Outcome Measure Difficulty turning over in bed (including adjusting bedclothes, sheets and blankets)?: Unable Difficulty moving  from lying on back to sitting on the side of the bed? : Unable Difficulty sitting down on and standing up from a chair with arms (e.g., wheelchair, bedside commode, etc,.)?: Unable Help needed moving to and from a bed to chair (including a wheelchair)?: A Lot Help needed walking in hospital room?: Total Help needed climbing 3-5 steps with a railing? : Total 6 Click Score: 7    End of Session Equipment Utilized During Treatment: Gait belt;Oxygen Activity Tolerance: Patient limited by pain Patient left: in chair;with call bell/phone within reach;with bed alarm set;with family/visitor present;with SCD's reapplied;Other (comment)(Pt declined up in chair) Nurse Communication: Mobility status;Patient requests pain meds PT Visit Diagnosis: Unsteadiness on feet (R26.81);Muscle weakness (generalized) (M62.81);Difficulty in walking, not elsewhere classified (R26.2)    Time: 1010-1055 PT Time Calculation (min) (ACUTE ONLY): 45 min   Charges:   PT Evaluation $PT Eval Low Complexity: 1 Low PT Treatments $Therapeutic Exercise: 8-22 mins $Therapeutic Activity: 8-22 mins        D. Scott Emmanual Gauthreaux PT, DPT 06/27/18, 11:51 AM

## 2018-06-27 NOTE — Clinical Social Work Note (Signed)
Clinical Social Work Assessment  Patient Details  Name: [name] MRN: [id number] Date of Birth: [birth year]  Date of referral:  06/27/18               Reason for consult:  Facility Placement                Permission sought to share information with:    Permission granted to share information::     Name::        Agency::     Relationship::     Contact Information:     Housing/Transportation Living arrangements for the past 2 months:  Apartment Source of Information:  Adult Children Patient Interpreter Needed:  None Criminal Activity/Legal Involvement Pertinent to Current Situation/Hospitalization:  No - Comment as needed Significant Relationships:  Adult Children Lives with:  Self Do you feel safe going back to the place where you live?  Yes Need for family participation in patient care:  Yes (Comment)  Care giving concerns:  Patient resides alone in an apartment.   Social Worker assessment / plan:  CSW attempted to see patient but she asked if her nurse's aid could come and assist her. CSW contacted her daughter via phone: [name]: [phone]. CSW discussed the PT recommendation for short term rehab. [name] that she and her mother are in agreement with this plan and that they prefer [place] because several of their family members have been residents there. CSW began bedsearch and received offer from [facility]. [name] at [facility] is aware and is beginning [place] with [employer].   Employment status:    Insurance information:  Managed Care PT Recommendations:  [place] / Referral to community resources:     Patient/Family's Response to care:  Patient's daughter expressed appreciation for CSW assistance.  Patient/Family's Understanding of and Emotional Response to Diagnosis, Current Treatment, and Prognosis:  Patient's daughter is hoping her mother can recover soon and return home.   Emotional  Assessment Appearance:  Appears stated age Attitude/Demeanor/Rapport:  (pleasant and cooperative) Affect (typically observed):  Accepting, Adaptable Orientation:  Oriented to Self, Oriented to Place, Oriented to  Time, Oriented to Situation Alcohol / Substance use:  Not Applicable Psych involvement (Current and /or in the community):  No (Comment)  Discharge Needs  Concerns to be addressed:  Care Coordination Readmission within the last 30 days:  No Current discharge risk:  None Barriers to Discharge:  No Barriers Identified   Shela Leff, LCSW 06/27/2018, 4:34 PM

## 2018-06-27 NOTE — Progress Notes (Addendum)
[place] at [facility]                                                                                                                                                                                  Patient Demographics   [name], is a 84 y.o. female, DOB - 1927-11-01, ERD:[id number]  Admit date - 06/22/2018   Admitting Physician Harrie Foreman, MD  Outpatient Primary MD for the patient is Valerie Roys, DO   LOS - 3  Subjective: Patient underwent surgery yesterday with resection of the concerned area Patient complains of some nausea   Review of Systems:   CONSTITUTIONAL: No documented fever. No fatigue, weakness. No weight gain, no weight loss.  EYES: No blurry or double vision.  ENT: No tinnitus. No postnasal drip. No redness of the oropharynx.  RESPIRATORY: No cough, no wheeze, no hemoptysis. No dyspnea.  CARDIOVASCULAR: No chest pain. No orthopnea. No palpitations. No syncope.  GASTROINTESTINAL: Positive nausea, no vomiting or diarrhea. No abdominal pain.  Positive melena or hematochezia.  GENITOURINARY: No dysuria or hematuria.  ENDOCRINE: No polyuria or nocturia. No heat or cold intolerance.  HEMATOLOGY: No anemia. No bruising. No bleeding.  INTEGUMENTARY: No rashes. No lesions.  MUSCULOSKELETAL: No arthritis. No swelling. No gout.  NEUROLOGIC: No numbness, tingling, or ataxia. No seizure-type activity.  PSYCHIATRIC: No anxiety. No insomnia. No ADD.    Vitals:   Vitals:   06/27/18 0655 06/27/18 0836 06/27/18 1108 06/27/18 1233  BP: (!) 147/60 (!) 160/58 (!) 152/63 (!) 146/60  Pulse: 79  77 76  Resp:   18 17  Temp:   97.9 F (36.6 C) 98 F (36.7 C)  TempSrc:   Oral Oral  SpO2: 100%  99% 95%  Weight:      Height:        Wt Readings from Last 3 Encounters:  06/26/18 44.6 kg  06/19/18 42.2 kg  05/19/18 42.2 kg     Intake/Output Summary (Last 24 hours) at 06/27/2018 1548 Last data filed at 06/27/2018 1411 Gross  per 24 hour  Intake 2886.55 ml  Output 2045 ml  Net 841.55 ml    Physical Exam:   GENERAL: Pleasant-appearing in no apparent distress.  HEAD, EYES, EARS, NOSE AND THROAT: Atraumatic, normocephalic. Extraocular muscles are intact. Pupils equal and reactive to light. Sclerae anicteric. No conjunctival injection. No oro-pharyngeal erythema.  NECK: Supple. There is no jugular venous distention. No bruits, no lymphadenopathy, no thyromegaly.  HEART: Regular rate and rhythm,. No murmurs, no rubs, no clicks.  LUNGS: Clear to auscultation bilaterally. No rales or rhonchi. No wheezes.  ABDOMEN: Soft, flat, nontender, nondistended.  Postop No hepatosplenomegaly  appreciated.  EXTREMITIES: No evidence of any cyanosis, clubbing, or peripheral edema.  +2 pedal and radial pulses bilaterally.  NEUROLOGIC: The patient is alert, awake, and oriented x3 with no focal motor or sensory deficits appreciated bilaterally.  SKIN: Moist and warm with no rashes appreciated.  Psych: Not anxious, depressed LN: No inguinal LN enlargement    Antibiotics   Anti-infectives (From admission, onward)   Start     Dose/Rate Route Frequency Ordered Stop   06/26/18 0600  cefoTEtan (CEFOTAN) 2 g in sodium chloride 0.9 % 100 mL IVPB     2 g 200 mL/hr over 30 Minutes Intravenous On call to O.R. 06/25/18 1154 06/26/18 1623   06/25/18 1200  neomycin (MYCIFRADIN) tablet 1,000 mg     1,000 mg Oral Every 6 hours 06/25/18 1154 06/25/18 2340   06/25/18 1200  erythromycin (ERY-TAB) EC tablet 1,000 mg  Status:  Discontinued     1,000 mg Oral Every 6 hours 06/25/18 1154 06/26/18 0559      Medications   Scheduled Meds: . amLODipine  2.5 mg Oral Daily  . enoxaparin (LOVENOX) injection  30 mg Subcutaneous Q24H  . [START ON 06/28/2018] famotidine  20 mg Oral Daily  . fluticasone furoate-vilanterol  1 puff Inhalation Daily  . levothyroxine  100 mcg Oral QAC breakfast  . metoprolol tartrate  50 mg Oral BID   Continuous  Infusions: . lactated ringers 75 mL/hr at 06/27/18 1318   PRN Meds:.acetaminophen, HYDROmorphone (DILAUDID) injection, ketorolac, ondansetron **OR** ondansetron (ZOFRAN) IV   Data Review:   Micro Results Recent Results (from the past 240 hour(s))  Surgical PCR screen     Status: None   Collection Time: 06/26/18  1:27 AM  Result Value Ref Range Status   MRSA, PCR NEGATIVE NEGATIVE Final   Staphylococcus aureus NEGATIVE NEGATIVE Final    Comment: (NOTE) The Xpert SA Assay (FDA approved for NASAL specimens in patients 6 years of age and older), is one component of a comprehensive surveillance program. It is not intended to diagnose infection nor to guide or monitor treatment. Performed at [facility], [address], [place], [place] [address]     Radiology Reports Ct Chest Wo Contrast  Result Date: 06/23/2018 CLINICAL DATA:  Past medical history of atrial fibrillation, COPD and intestinal perforation. Presents to the emergency department with dark tarry stools. CT of abdomen revealed a mass at the appendix flexure. Staging workup. EXAM: CT CHEST WITHOUT CONTRAST TECHNIQUE: Multidetector CT imaging of the chest was performed following the standard protocol without IV contrast. COMPARISON:  None. FINDINGS: Cardiovascular: Aortic atherosclerosis. No thoracic aortic aneurysm. Heart size is within normal limits. No pericardial effusion. Three-vessel coronary artery calcifications. Mediastinum/Nodes: No mass or enlarged lymph nodes seen within the mediastinum or perihilar regions. Esophagus is unremarkable. Trachea is unremarkable. Lungs/Pleura: No pulmonary nodule or mass. Diffuse emphysematous change, moderate to severe in degree, upper lobe predominant. Associated mild scarring/fibrosis at the lung apices. No pneumonia, pleural effusion or pulmonary edema. Upper Abdomen: Limited images of the upper abdomen are unremarkable. Musculoskeletal: Mild degenerative spondylosis throughout  the scoliotic thoracolumbar spine. No acute or suspicious osseous finding. IMPRESSION: 1. No evidence of metastatic disease within the chest. 2. No acute findings within the chest. 3. Emphysema, moderate to severe in degree. 4. Aortic atherosclerosis. Particularly advanced atherosclerosis of the lower descending thoracic aorta and upper abdominal aorta. 5. Three-vessel coronary artery calcifications. Heart size is normal. Aortic Atherosclerosis (ICD10-I70.0) and Emphysema (ICD10-J43.9). Electronically Signed   By: Cherlynn Kaiser  Enriqueta Shutter M.D.   On: 06/23/2018 15:12   Ct Angio Abd/pel W And/or Wo Contrast  Result Date: 06/22/2018 CLINICAL DATA:  GI bleed. EXAM: CTA ABDOMEN AND PELVIS wITHOUT AND WITH CONTRAST TECHNIQUE: Multidetector CT imaging of the abdomen and pelvis was performed using the standard protocol during bolus administration of intravenous contrast. Multiplanar reconstructed images and MIPs were obtained and reviewed to evaluate the vascular anatomy. CONTRAST:  175mL ISOVUE-370 IOPAMIDOL (ISOVUE-370) INJECTION 76% COMPARISON:  None. FINDINGS: VASCULAR Aorta: Diffuse dense irregular calcified and noncalcified atheromatous plaque, coarse plaque causes approximately 50% luminal narrowing of the upper abdominal aorta just proximal to the SMA. No aneurysm. No evidence of vasculitis. Celiac: Severe stenosis at the origin due to calcified and noncalcified plaque. Early branching of right and left hepatic arteries versus aortic origin of the right hepatic artery. SMA: Patent without evidence of aneurysm, dissection, vasculitis or significant stenosis. Renals: Plaque at the origin of the right renal artery causes severe stenosis with near complete occlusion, tiny vessel into an atrophic right kidney. Moderate plaque at the origin of the left renal artery causes approximately 50% stenosis. IMA: Patent without evidence of aneurysm, dissection, vasculitis or significant stenosis. Inflow: Moderate plaque that evidence of  aneurysm, dissection, vasculitis or significant stenosis. Proximal Outflow: Bilateral common femoral and visualized portions of the superficial and profunda femoral arteries are patent without evidence of aneurysm, dissection, vasculitis or significant stenosis. Veins: Portal and mesenteric veins appear patent and venous phase imaging. Iliac veins and IVC are unremarkable. Review of the MIP images confirms the above findings. NON-VASCULAR Lower chest: Emphysema without consolidation or pleural effusion. Scattered atelectasis. There are coronary artery calcifications. Small hiatal hernia. Hepatobiliary: No focal liver abnormality is seen. No gallstones, gallbladder wall thickening, or biliary dilatation. Pancreas: Atrophic parenchyma. No ductal dilatation or inflammation. Spleen: Normal in size without focal abnormality. Adrenals/Urinary Tract: No adrenal nodule. Marked right renal atrophy, right kidney is poorly perfused. 11 mm cyst in the mid right kidney. No left hydronephrosis or perinephric edema. Urinary bladder is partially distended. No bladder wall thickening. Stomach/Bowel: Small hiatal hernia. Stomach is nondistended. There is no contrast extravasation into the GI tract to localize site of GI bleed. Wall thickening of the cecum and ascending colon, with masslike thickening at the hepatic flexure, for example image 87 series 7. This area of masslike thickening spans at least 5 cm. No pericolonic stranding. No small bowel dilatation or obstruction. Moderate stool in the distal colon. Distal colonic diverticulosis without diverticulitis. Appendix not definitively visualized. Lymphatic: No enlarged abdominal or pelvic lymph nodes. Reproductive: Status post hysterectomy. No adnexal masses. Other: No free air or ascites. Musculoskeletal: Scoliotic curvature and multilevel degenerative change in the spine. There are no acute or suspicious osseous abnormalities. IMPRESSION: VASCULAR 1. No active extravasation into  the GI tract to localize site of GI bleed. 2. Diffuse calcified and noncalcified atheromatous plaque throughout the abdominal aorta, plaque causes approximately 50% narrowing of the upper abdominal aorta at the level of the SMA. 3. 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 Hepatic or right hepatic artery likely rises directly from the aorta, normal variant anatomy. 4. High-grade stenosis at the origin of the right renal artery with associated right renal atrophy and poor perfusion of the right kidney. NON-VASCULAR 1. Colonic wall thickening of the cecum and ascending colon, with masslike thickening at the hepatic flexure. Hepatic flexure findings are suspicious for colonic malignancy, with possible ascending colitis. Recommend direct visualization with  colonoscopy. 2. Distal colonic diverticulosis without diverticulitis. Aortic Atherosclerosis (ICD10-I70.0) and Emphysema (ICD10-J43.9). Electronically Signed   By: Keith Rake M.D.   On: 06/22/2018 06:04     CBC Recent Labs  Lab 06/22/18 0424 06/22/18 1200 06/25/18 0345 06/27/18 0526  WBC 7.7  --  7.4 21.8*  HGB 11.9* 12.3 11.4* 13.5  HCT 37.7  --  35.8* 41.3  PLT 244  --  229 284  MCV 86.9  --  86.7 84.8  MCH 27.4  --  27.6 27.7  MCHC 31.6  --  31.8 32.7  RDW 13.5  --  13.8 13.5  LYMPHSABS 2.0  --  2.5 1.4  MONOABS 0.7  --  0.7 0.6  EOSABS 0.1  --  0.2 0.0  BASOSABS 0.1  --  0.0 0.1    Chemistries  Recent Labs  Lab 06/22/18 0424 06/25/18 0345 06/27/18 0526  NA 139 139 132*  K 4.1 3.7 3.9  CL 105 108 98  CO2 27 25 24   GLUCOSE 111* 91 149*  BUN 18 10 9   CREATININE 1.29* 1.00 0.97  CALCIUM 8.8* 8.3* 8.7*  AST 21 17  --   ALT 10 8  --   ALKPHOS 94 72  --   BILITOT 0.4 0.6  --    ------------------------------------------------------------------------------------------------------------------ estimated creatinine clearance is 28.2 mL/min (by C-G formula  based on SCr of 0.97 mg/dL). ------------------------------------------------------------------------------------------------------------------ No results for input(s): HGBA1C in the last 72 hours. ------------------------------------------------------------------------------------------------------------------ No results for input(s): CHOL, HDL, LDLCALC, TRIG, CHOLHDL, LDLDIRECT in the last 72 hours. ------------------------------------------------------------------------------------------------------------------ No results for input(s): TSH, T4TOTAL, T3FREE, THYROIDAB in the last 72 hours.  Invalid input(s): FREET3 ------------------------------------------------------------------------------------------------------------------ No results for input(s): VITAMINB12, FOLATE, FERRITIN, TIBC, IRON, RETICCTPCT in the last 72 hours.  Coagulation profile Recent Labs  Lab 06/25/18 0345  INR 1.10    No results for input(s): DDIMER in the last 72 hours.  Cardiac Enzymes No results for input(s): CKMB, TROPONINI, MYOGLOBIN in the last 168 hours.  Invalid input(s): CK ------------------------------------------------------------------------------------------------------------------ Invalid input(s): [place]   This is a an 84 year old female admitted for new colon mass. 1.  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  Melena: Hemoglobin stable due to #1 3.  Atrial fibrillation: Controlled; continue metoprolol.  Continue to hold aspirin resume once okayed by surgery  4.  Hypothyroidism:  continue Synthroid 5.  COPD: Stable; continue inhaled corticosteroid and long-acting bronchial agonist evidence exasperation 6.    Peripheral vascular disease with celiac stenosis as well as renal artery stenosis outpatient vascular follow-up recommended 7.  GI prophylaxis: PPI      Code Status Orders  (From admission, onward)          Start     Ordered   06/22/18 1035  Full code  Continuous     06/22/18 1034        Code Status History    This patient has a current code status but no historical code status.    Advance Directive Documentation     Most Recent Value  Type of Advance Directive  Living will  Pre-existing out of facility DNR order (yellow form or pink MOST form)  -  "MOST" Form in Place?  -           Consults gastroenterology  DVT Prophylaxis SCD  Lab Results  Component Value Date   PLT 284 06/27/2018     Time Spent in minutes 25  minutes  greater than 50% of time spent in care coordination and counseling patient regarding the condition and plan of care.   Dustin Flock M.D on 06/27/2018 at 3:48 PM  Between 7am to 6pm - Pager - [phone]  After 6pm go to [email or web address] - Proofreader  [employer]   Office  [phone]

## 2018-06-27 NOTE — Progress Notes (Signed)
[facility] Progress Note  1 Day Post-Op  Subjective: Patient is resting comfortably in bed. Complaints of abdominal soreness near incisions. No nausea, emesis,fever, chills. Has not had breakfast this morning. Not mobilized this morning.   Objective: Vital signs in last 24 hours: Temp:  [97.4 F (36.3 C)-98.4 F (36.9 C)] 97.5 F (36.4 C) (10/25 0447) Pulse Rate:  [67-81] 79 (10/25 0655) Resp:  [16-20] 18 (10/25 0447) BP: (136-191)/(60-87) 147/60 (10/25 0655) SpO2:  [95 %-100 %] 100 % (10/25 0655) Last BM Date: 06/25/18  Intake/Output from previous day: 10/24 0701 - 10/25 0700 In: 1400 [I.V.:1400] Out: 1495 [Urine:1480; Blood:15] Intake/Output this shift: No intake/output data recorded.  PE: Gen:  Alert, NAD, pleasant Card:  Regular rate and rhythm Pulm:  Normal effort, clear to auscultation bilaterally Abd: Soft, non-tender, non-distended, laparoscopic incisions are CDI, no drainage, honeycomb in place Skin: warm and dry, no rashes  Psych: A&Ox3   Lab Results:  Recent Labs    06/25/18 0345 06/27/18 0526  WBC 7.4 21.8*  HGB 11.4* 13.5  HCT 35.8* 41.3  PLT 229 284   BMET Recent Labs    06/25/18 0345 06/27/18 0526  NA 139 132*  K 3.7 3.9  CL 108 98  CO2 25 24  GLUCOSE 91 149*  BUN 10 9  CREATININE 1.00 0.97  CALCIUM 8.3* 8.7*   PT/INR Recent Labs    06/25/18 0345  LABPROT 14.1  INR 1.10   CMP     Component Value Date/Time   NA 132 (L) 06/27/2018 0526   NA 138 06/19/2018 1520   K 3.9 06/27/2018 0526   CL 98 06/27/2018 0526   CO2 24 06/27/2018 0526   GLUCOSE 149 (H) 06/27/2018 0526   BUN 9 06/27/2018 0526   BUN 22 06/19/2018 1520   CREATININE 0.97 06/27/2018 0526   CALCIUM 8.7 (L) 06/27/2018 0526   PROT 5.5 (L) 06/25/2018 0345   PROT 6.4 05/19/2018 1433   ALBUMIN 3.1 (L) 06/25/2018 0345   ALBUMIN 4.1 05/19/2018 1433   AST 17 06/25/2018 0345   ALT 8 06/25/2018 0345   ALKPHOS 72 06/25/2018 0345   BILITOT 0.6 06/25/2018  0345   BILITOT 0.3 05/19/2018 1433   GFRNONAA 51 (L) 06/27/2018 0526   GFRAA 59 (L) 06/27/2018 0526   Lipase  No results found for: LIPASE     Studies/Results: No results found.  Anti-infectives: Anti-infectives (From admission, onward)   Start     Dose/Rate Route Frequency Ordered Stop   06/26/18 0600  cefoTEtan (CEFOTAN) 2 g in sodium chloride 0.9 % 100 mL IVPB     2 g 200 mL/hr over 30 Minutes Intravenous On call to O.R. 06/25/18 1154 06/26/18 1623   06/25/18 1200  neomycin (MYCIFRADIN) tablet 1,000 mg     1,000 mg Oral Every 6 hours 06/25/18 1154 06/25/18 2340   06/25/18 1200  erythromycin (ERY-TAB) EC tablet 1,000 mg  Status:  Discontinued     1,000 mg Oral Every 6 hours 06/25/18 1154 06/26/18 0559       Assessment/Plan   Adenocarcinoma of the Hepatic Flexure [name] is a 84 y.o. female with adenocarcinoma of the hepatic flexure who is 1 day s/p laparoscopic colectomy with leukocytosis likely attributed to stress reaction from surgery complicated by pertinent comobidities including anxiety, atrial fibrillation on ASA, COPD, hypothyroidism, history of DVT, HTN, advanced age, and former tobacco abuse (Smoking).   - Clear Liquids Today - Decrease IVF as diet advances - Pain control (PRN),  minimize narcotics if possible.  - Monitor abdominal exam and ongoing bowel function - Will trend leukocytosis over the weekend, no labs for tomorrow AM - Discontinue foley catheter - Consult oncology to establish care with patient and provide any further recommendations - Mobilize, PT on consult - DVT Prophylaxis    LOS: 3 days    Edison Simon , PA-C [facility] 06/27/2018, 8:13 AM [phone] M-F: 7am - 4pm

## 2018-06-27 NOTE — Care Management Important Message (Signed)
Copy of signed IM left with patient in room.  

## 2018-06-27 NOTE — Plan of Care (Signed)
The patient had trouble ambulating with PT as she had pain and nausea. Nausea meds have been provided. Nausea increases after oral intake. Hypoactive bowel sounds. Pain meds given PRN.  Problem: Spiritual Needs Goal: Ability to function at adequate level Outcome: Progressing   Problem: Education: Goal: Knowledge of General Education information will improve Description Including pain rating scale, medication(s)/side effects and non-pharmacologic comfort measures Outcome: Progressing   Problem: Health Behavior/Discharge Planning: Goal: Ability to manage health-related needs will improve Outcome: Progressing   Problem: Clinical Measurements: Goal: Ability to maintain clinical measurements within normal limits will improve Outcome: Progressing Goal: Will remain free from infection Outcome: Progressing Goal: Diagnostic test results will improve Outcome: Progressing Goal: Respiratory complications will improve Outcome: Progressing Goal: Cardiovascular complication will be avoided Outcome: Progressing   Problem: Activity: Goal: Risk for activity intolerance will decrease Outcome: Progressing   Problem: Nutrition: Goal: Adequate nutrition will be maintained Outcome: Progressing   Problem: Coping: Goal: Level of anxiety will decrease Outcome: Progressing   Problem: Elimination: Goal: Will not experience complications related to bowel motility Outcome: Progressing Goal: Will not experience complications related to urinary retention Outcome: Progressing   Problem: Pain Managment: Goal: General experience of comfort will improve Outcome: Progressing   Problem: Safety: Goal: Ability to remain free from injury will improve Outcome: Progressing   Problem: Skin Integrity: Goal: Risk for impaired skin integrity will decrease Outcome: Progressing   Problem: Education: Goal: Ability to identify signs and symptoms of gastrointestinal bleeding will improve Outcome:  Progressing   Problem: Bowel/Gastric: Goal: Will show no signs and symptoms of gastrointestinal bleeding Outcome: Progressing   Problem: Fluid Volume: Goal: Will show no signs and symptoms of excessive bleeding Outcome: Progressing   Problem: Clinical Measurements: Goal: Complications related to the disease process, condition or treatment will be avoided or minimized Outcome: Progressing

## 2018-06-27 NOTE — NC FL2 (Signed)
[place] LEVEL OF CARE SCREENING TOOL     IDENTIFICATION  Patient Name: [name] Birthdate: [birth year] Sex: female Admission Date (Current Location): 06/22/2018  [place] and [place] Number:  Engineering geologist and Address:  [facility], [address] [place], [place] [address]      Provider Number: [id number]  Attending Physician Name and Address:  Olean Ree, MD  Relative Name and Phone Number:       Current Level of Care: Hospital Recommended Level of Care: [place] Prior Approval Number:    Date Approved/Denied:   PASRR Number:    Discharge Plan: SNF    Current Diagnoses: Patient Active Problem List   Diagnosis Date Noted  . Malignant neoplasm of hepatic flexure ([place])   . Melena 06/22/2018  . Protein-calorie malnutrition ([place]) 06/19/2018  . Senile purpura ([place]) 06/19/2018  . Anxiety 04/28/2018  . Cataract of both eyes 04/28/2018  . Diverticulosis 04/28/2018  . DJD (degenerative joint disease) 04/28/2018  . SOB (shortness of breath) 04/15/2018  . Oxygen dependent 02/07/2018  . COPD (chronic obstructive pulmonary disease) ([place])   . Depression   . Overactive bladder   . Hypothyroidism   . Bilateral leg edema 12/16/2017  . History of fall 08/13/2017  . Open wound of right lower leg 08/13/2017  . Obstructive chronic bronchitis without exacerbation ([place]) 12/18/2016  . Renal artery stenosis ([place]) 12/18/2016  . Risk for falls 06/19/2016  . Chronic respiratory failure with hypoxia ([place]) 10/25/2015  . History of DVT (deep vein thrombosis) 10/11/2014  . PSVT (paroxysmal supraventricular tachycardia) ([place]) 10/22/2013  . Essential hypertension 12/20/2011  . PAF (paroxysmal atrial fibrillation) ([place]) 08/22/2011    Orientation RESPIRATION BLADDER Height & Weight     Self, Place  Normal Continent Weight: 98 lb 6.4 oz (44.6 kg) Height:  5' (152.4 cm)  BEHAVIORAL SYMPTOMS/MOOD NEUROLOGICAL BOWEL  NUTRITION STATUS  (none) (none) Continent Diet  AMBULATORY STATUS COMMUNICATION OF NEEDS Skin   Extensive Assist Verbally Normal                       Personal Care Assistance Level of Assistance  Bathing, Feeding, Dressing Bathing Assistance: Limited assistance Feeding assistance: Limited assistance Dressing Assistance: Limited assistance     Functional Limitations Info  (none)          SPECIAL CARE FACTORS FREQUENCY  PT (By licensed PT)                    Contractures Contractures Info: Not present    Additional Factors Info  Code Status, Allergies Code Status Info: full Allergies Info: nka           Current Medications (06/27/2018):  This is the current hospital active medication list Current Facility-Administered Medications  Medication Dose Route Frequency Provider Last Rate Last Dose  . acetaminophen (TYLENOL) tablet 1,000 mg  1,000 mg Oral Q6H PRN Piscoya, Jose, MD      . amLODipine (NORVASC) tablet 2.5 mg  2.5 mg Oral Daily Piscoya, Jose, MD   2.5 mg at 06/27/18 0836  . enoxaparin (LOVENOX) injection 30 mg  30 mg Subcutaneous Q24H Piscoya, Jose, MD   30 mg at 06/27/18 0837  . [START ON 06/28/2018] famotidine (PEPCID) tablet 20 mg  20 mg Oral Daily Dustin Flock, MD      . fluticasone furoate-vilanterol (BREO ELLIPTA) 200-25 MCG/INH 1 puff  1 puff Inhalation Daily Piscoya, Jose, MD   1 puff  at 06/27/18 1000  . HYDROmorphone (DILAUDID) injection 0.5 mg  0.5 mg Intravenous Q4H PRN Piscoya, Jose, MD   0.5 mg at 06/27/18 1101  . ketorolac (TORADOL) 30 MG/ML injection 15 mg  15 mg Intravenous Q6H PRN Piscoya, Jose, MD      . lactated ringers infusion   Intravenous Continuous Olean Ree, MD 75 mL/hr at 06/27/18 1549    . levothyroxine (SYNTHROID, LEVOTHROID) tablet 100 mcg  100 mcg Oral QAC breakfast Olean Ree, MD   100 mcg at 06/27/18 0836  . metoprolol tartrate (LOPRESSOR) tablet 50 mg  50 mg Oral BID Olean Ree, MD   50 mg at 06/27/18 0836  .  ondansetron (ZOFRAN) tablet 4 mg  4 mg Oral Q6H PRN Piscoya, Jose, MD   4 mg at 06/27/18 0836   Or  . ondansetron (ZOFRAN) injection 4 mg  4 mg Intravenous Q6H PRN Olean Ree, MD   4 mg at 06/25/18 1736     Discharge Medications: Please see discharge summary for a list of discharge medications.  Relevant Imaging Results:  Relevant Lab Results:   Additional Information ss: [id number]  Shela Leff, LCSW

## 2018-06-28 ENCOUNTER — Telehealth: Payer: Self-pay | Admitting: Internal Medicine

## 2018-06-28 DIAGNOSIS — Z87891 Personal history of nicotine dependence: Secondary | ICD-10-CM

## 2018-06-28 DIAGNOSIS — D500 Iron deficiency anemia secondary to blood loss (chronic): Secondary | ICD-10-CM

## 2018-06-28 DIAGNOSIS — K922 Gastrointestinal hemorrhage, unspecified: Secondary | ICD-10-CM

## 2018-06-28 DIAGNOSIS — Z9049 Acquired absence of other specified parts of digestive tract: Secondary | ICD-10-CM

## 2018-06-28 DIAGNOSIS — G8918 Other acute postprocedural pain: Secondary | ICD-10-CM

## 2018-06-28 DIAGNOSIS — C189 Malignant neoplasm of colon, unspecified: Secondary | ICD-10-CM

## 2018-06-28 LAB — BASIC METABOLIC PANEL
Anion gap: 9 (ref 5–15)
BUN: 12 mg/dL (ref 8–23)
CO2: 27 mmol/L (ref 22–32)
Calcium: 8.4 mg/dL — ABNORMAL LOW (ref 8.9–10.3)
Chloride: 97 mmol/L — ABNORMAL LOW (ref 98–111)
Creatinine, Ser: 0.97 mg/dL (ref 0.44–1.00)
GFR calc Af Amer: 59 mL/min — ABNORMAL LOW (ref >60)
GFR calc non Af Amer: 51 mL/min — ABNORMAL LOW (ref >60)
Glucose, Bld: 100 mg/dL — ABNORMAL HIGH (ref 70–99)
Potassium: 4.4 mmol/L (ref 3.5–5.1)
Sodium: 133 mmol/L — ABNORMAL LOW (ref 135–145)

## 2018-06-28 LAB — CBC
HCT: 36.8 % (ref 36.0–46.0)
Hemoglobin: 11.9 g/dL — ABNORMAL LOW (ref 12.0–15.0)
MCH: 27.5 pg (ref 26.0–34.0)
MCHC: 32.3 g/dL (ref 30.0–36.0)
MCV: 85.2 fL (ref 80.0–100.0)
Platelets: 287 10*3/uL (ref 150–400)
RBC: 4.32 MIL/uL (ref 3.87–5.11)
RDW: 14 % (ref 11.5–15.5)
WBC: 16.3 10*3/uL — ABNORMAL HIGH (ref 4.0–10.5)
nRBC: 0 % (ref 0.0–0.2)

## 2018-06-28 MED ORDER — ASPIRIN EC 81 MG PO TBEC
81.0000 mg | DELAYED_RELEASE_TABLET | Freq: Every day | ORAL | Status: DC
  Administered 2018-06-29 – 2018-07-01 (×3): 81 mg via ORAL
  Filled 2018-06-28 (×3): qty 1

## 2018-06-28 NOTE — Progress Notes (Signed)
06/28/2018  Subjective: Patient is 2 Days Post-Op status post laparoscopic right colectomy for right colon cancer.  No acute events overnight but this morning she reports that she felt a little nauseous when she started drinking liquids.  She did have 3 bowel movements recorded.  She does report pain in the midline incision.  Vital signs: Temp:  [97.9 F (36.6 C)-98.2 F (36.8 C)] 97.9 F (36.6 C) (10/26 1158) Pulse Rate:  [59-69] 59 (10/26 1158) Resp:  [16-18] 17 (10/26 1158) BP: (153-157)/(54-79) 157/54 (10/26 1158) SpO2:  [94 %-97 %] 97 % (10/26 1158) Weight:  [46.5 kg] 46.5 kg (10/26 0440)   Intake/Output: 10/25 0701 - 10/26 0700 In: 2836.6 [P.O.:310; I.V.:2526.6] Out: 1050 [Urine:1050] Last BM Date: 06/27/18  Physical Exam: Constitutional: No acute distress Abdomen: Soft, nondistended, appropriately tender to palpation.  Incisions are clean dry and intact with honeycomb dressings in place.  Labs:  Recent Labs    06/27/18 0526 06/28/18 0758  WBC 21.8* 16.3*  HGB 13.5 11.9*  HCT 41.3 36.8  PLT 284 287   Recent Labs    06/27/18 0526 06/28/18 0758  NA 132* 133*  K 3.9 4.4  CL 98 97*  CO2 24 27  GLUCOSE 149* 100*  BUN 9 12  CREATININE 0.97 0.97  CALCIUM 8.7* 8.4*   No results for input(s): LABPROT, INR in the last 72 hours.  Imaging: No results found.  Assessment/Plan: This is a 84 y.o. female s/p laparoscopic right colectomy.  Patient's WBC continues to improve likely postoperative reaction.  We will continue clear liquid diet at this point as the patient having some nausea.  Once is better bowel function or less nausea we will start advancing diet.  Otherwise continue ambulation and work with PT.   Melvyn Neth, [place]

## 2018-06-28 NOTE — Progress Notes (Signed)
Diet changed to soft- Verbal order by Dr. Hampton Abbot

## 2018-06-28 NOTE — Plan of Care (Signed)
The patient indicates that she has been drinking coffee and [place] today. Indicated nausea has been reduced. The patient was encouraged by the RN to get up. The patient was given pain medication and then gotten up. She indicated she was dizzy sitting at the side of the bed and had pain. Dizziness improved and the patient got up with one person assist using a walker. During ambulation the patient indicated she was feeling "bad allover". The patient walked about 3ft in the room and was shaking while walking. The patient was returned to her bed.  Problem: Spiritual Needs Goal: Ability to function at adequate level Outcome: Progressing   Problem: Education: Goal: Knowledge of General Education information will improve Description Including pain rating scale, medication(s)/side effects and non-pharmacologic comfort measures Outcome: Progressing   Problem: Health Behavior/Discharge Planning: Goal: Ability to manage health-related needs will improve Outcome: Progressing   Problem: Clinical Measurements: Goal: Ability to maintain clinical measurements within normal limits will improve Outcome: Progressing Goal: Will remain free from infection Outcome: Progressing Goal: Diagnostic test results will improve Outcome: Progressing Goal: Respiratory complications will improve Outcome: Progressing Goal: Cardiovascular complication will be avoided Outcome: Progressing   Problem: Activity: Goal: Risk for activity intolerance will decrease Outcome: Progressing   Problem: Nutrition: Goal: Adequate nutrition will be maintained Outcome: Progressing   Problem: Coping: Goal: Level of anxiety will decrease Outcome: Progressing   Problem: Elimination: Goal: Will not experience complications related to bowel motility Outcome: Progressing Goal: Will not experience complications related to urinary retention Outcome: Progressing   Problem: Pain Managment: Goal: General experience of comfort will  improve Outcome: Progressing   Problem: Safety: Goal: Ability to remain free from injury will improve Outcome: Progressing   Problem: Skin Integrity: Goal: Risk for impaired skin integrity will decrease Outcome: Progressing   Problem: Education: Goal: Ability to identify signs and symptoms of gastrointestinal bleeding will improve Outcome: Progressing   Problem: Bowel/Gastric: Goal: Will show no signs and symptoms of gastrointestinal bleeding Outcome: Progressing   Problem: Fluid Volume: Goal: Will show no signs and symptoms of excessive bleeding Outcome: Progressing   Problem: Clinical Measurements: Goal: Complications related to the disease process, condition or treatment will be avoided or minimized Outcome: Progressing

## 2018-06-28 NOTE — Telephone Encounter (Signed)
Colette-please have the patient follow-up with me in the week of November 4th [Dx: Colon cancer]; labs-CBC BMP. [[place]- please order].   Call pt's daughter- debbie-Home phone [phone]; cell phone [phone] to make appointment.

## 2018-06-28 NOTE — Progress Notes (Signed)
Patient ID: [name], female   DOB: 05/01/1928, 84 y.o.   MRN: [id number]  [employer] PROGRESS NOTE  [name] URK:[id number] DOB: [birth year] DOA: 06/22/2018 PCP: Park Liter P, DO  HPI/Subjective: Patient having abdominal pain.  Some nausea.  Had a bowel movement and passing gas.  Objective: Vitals:   06/28/18 0440 06/28/18 1158  BP: (!) 153/79 (!) 157/54  Pulse: 66 (!) 59  Resp: 16 17  Temp: 98.2 F (36.8 C) 97.9 F (36.6 C)  SpO2: 95% 97%    Filed Weights   06/25/18 0412 06/26/18 0420 06/28/18 0440  Weight: 43.8 kg 44.6 kg 46.5 kg    ROS: Review of Systems  Constitutional: Negative for chills and fever.  Eyes: Negative for blurred vision.  Respiratory: Negative for cough and shortness of breath.   Cardiovascular: Negative for chest pain.  Gastrointestinal: Positive for abdominal pain. Negative for constipation, diarrhea, nausea and vomiting.  Genitourinary: Negative for dysuria.  Musculoskeletal: Negative for joint pain.  Neurological: Negative for dizziness and headaches.   Exam: Physical Exam  HENT:  Nose: No mucosal edema.  Mouth/Throat: No oropharyngeal exudate or posterior oropharyngeal edema.  Eyes: Pupils are equal, round, and reactive to light. Conjunctivae, EOM and lids are normal.  Neck: No JVD present. Carotid bruit is not present. No edema present. No thyroid mass and no thyromegaly present.  Cardiovascular: S1 normal and S2 normal. Exam reveals no gallop.  No murmur heard. Pulses:      Dorsalis pedis pulses are 2+ on the right side, and 2+ on the left side.  Respiratory: No respiratory distress. She has no wheezes. She has no rhonchi. She has no rales.  GI: Soft. Bowel sounds are normal. There is tenderness.  Musculoskeletal:       Right shoulder: She exhibits no swelling.  Lymphadenopathy:    She has no cervical adenopathy.  Neurological: She is alert. No cranial nerve deficit.  Skin: Skin is warm. No rash noted. Nails  show no clubbing.  Psychiatric: She has a normal mood and affect.      Data Reviewed: Basic Metabolic Panel: Recent Labs  Lab 06/22/18 0424 06/25/18 0345 06/27/18 0526 06/28/18 0758  NA 139 139 132* 133*  K 4.1 3.7 3.9 4.4  CL 105 108 98 97*  CO2 27 25 24 27   GLUCOSE 111* 91 149* 100*  BUN 18 10 9 12   CREATININE 1.29* 1.00 0.97 0.97  CALCIUM 8.8* 8.3* 8.7* 8.4*   Liver Function Tests: Recent Labs  Lab 06/22/18 0424 06/25/18 0345  AST 21 17  ALT 10 8  ALKPHOS 94 72  BILITOT 0.4 0.6  PROT 6.5 5.5*  ALBUMIN 3.7 3.1*   CBC: Recent Labs  Lab 06/22/18 0424 06/22/18 1200 06/25/18 0345 06/27/18 0526 06/28/18 0758  WBC 7.7  --  7.4 21.8* 16.3*  NEUTROABS 4.8  --  3.9 19.5*  --   HGB 11.9* 12.3 11.4* 13.5 11.9*  HCT 37.7  --  35.8* 41.3 36.8  MCV 86.9  --  86.7 84.8 85.2  PLT 244  --  229 284 287     Recent Results (from the past 240 hour(s))  Surgical PCR screen     Status: None   Collection Time: 06/26/18  1:27 AM  Result Value Ref Range Status   MRSA, PCR NEGATIVE NEGATIVE Final   Staphylococcus aureus NEGATIVE NEGATIVE Final    Comment: (NOTE) The Xpert SA Assay (FDA approved for NASAL specimens in patients 31 years of age and older),  is one component of a comprehensive surveillance program. It is not intended to diagnose infection nor to guide or monitor treatment. Performed at [facility], [address] [place], [place] [address]       Scheduled Meds: . amLODipine  2.5 mg Oral Daily  . enoxaparin (LOVENOX) injection  30 mg Subcutaneous Q24H  . famotidine  20 mg Oral Daily  . fluticasone furoate-vilanterol  1 puff Inhalation Daily  . levothyroxine  100 mcg Oral QAC breakfast  . metoprolol tartrate  50 mg Oral BID   Continuous Infusions: . lactated ringers 75 mL/hr at 06/28/18 0600    Assessment/Plan:  1. 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 2. Melena.  Hemoglobin stable 3. Atrial fibrillation.  Continue  metoprolol.  Aspirin on hold after surgery. 4. Hypothyroidism unspecified on levothyroxine 5. History of COPD.  Respiratory status stable 6. 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 Hypertension on Norvasc 8. Weakness.  Physical therapy recommends rehab  Code Status:     Code Status Orders  (From admission, onward)         Start     Ordered   06/22/18 1035  Full code  Continuous     06/22/18 1034        Code Status History    This patient has a current code status but no historical code status.    Advance Directive Documentation     Most Recent Value  Type of Advance Directive  Living will  Pre-existing out of facility DNR order (yellow form or pink MOST form)  -  "MOST" Form in Place?  -     Family Communication: As per surgery Disposition Plan: Likely will end up needing rehab  Consultants:  General surgery  Oncology  Procedures:  Colon resection  Time spent: 60 minutes  [address]

## 2018-06-28 NOTE — Consult Note (Signed)
[facility] CONSULT NOTE  Patient Care Team: Valerie Roys, DO as PCP - General (Family Medicine) Laverle Hobby, MD as Consulting Physician (Pulmonary Disease) Minna Merritts, MD as Consulting Physician (Cardiology)  CHIEF COMPLAINTS/PURPOSE OF CONSULTATION:  Colon cancer  HISTORY OF PRESENTING ILLNESS:  [name] 84 y.o.  female with no significant past medical history except for A. Fib-who has been living by herself at home is currently admitted the hospital for GI bleed/anemia.  Further work-up including a CT scan showed-right colon mass/hepatic flexure.  Patient had a colonoscopy that confirmed colon cancer high-grade adenocarcinoma.  Patient subsequently underwent surgery right hemicolectomy; patient is recuperating fairly well.  Patient complains of mild incisional pain at the site of surgery.  Otherwise she has been able to part spent with physical therapy today.  Patient awaiting discharge 1 to 2 days to [place] care/skilled facility for short-term basis.  ROS: A complete 10 point review of system is done which is negative except mentioned above in history of present illness  MEDICAL HISTORY:  Past Medical History:  Diagnosis Date  . Anxiety   . Atrial fibrillation ([place])   . Clotting disorder ([facility])    left ankle  . COPD (chronic obstructive pulmonary disease) ([place])   . Depression   . Overactive bladder   . Thyroid disease     SURGICAL HISTORY: Past Surgical History:  Procedure Laterality Date  . ABDOMINAL HYSTERECTOMY    . BREAST LUMPECTOMY    . COLONOSCOPY Left 06/23/2018   Procedure: COLONOSCOPY;  Surgeon: Jonathon Bellows, MD;  Location: [facility] ENDOSCOPY;  Service: Gastroenterology;  Laterality: Left;  Colonoscopy first. Dr. Vicente Males will decide if EGD is needed after colonoscopy  . ESOPHAGOGASTRODUODENOSCOPY Left 06/23/2018   Procedure: ESOPHAGOGASTRODUODENOSCOPY (EGD);  Surgeon: Jonathon Bellows, MD;  Location: [facility] ENDOSCOPY;  Service:  Gastroenterology;  Laterality: Left;  Colonoscopy first. Dr. Vicente Males will decide if EGD is needed after colonoscopy  . LAPAROSCOPIC RIGHT COLECTOMY Right 06/26/2018   Procedure: LAPAROSCOPIC RIGHT COLECTOMY;  Surgeon: Olean Ree, MD;  Location: [facility] ORS;  Service: General;  Laterality: Right;    SOCIAL HISTORY: Patient lives at home and apartment.  Her daughters live close by.  Patient moved from [place] approximately 3 years ago to live with her children. Social History   Socioeconomic History  . Marital status: Divorced    Spouse name: Not on file  . Number of children: Not on file  . Years of education: Not on file  . Highest education level: High school graduate  Occupational History  . Not on file  Social Needs  . Financial resource strain: Somewhat hard  . Food insecurity:    Worry: Never true    Inability: Never true  . Transportation needs:    Medical: No    Non-medical: No  Tobacco Use  . Smoking status: Former Smoker    Years: 25.00    Types: Cigarettes    Last attempt to quit: 02/01/1986    Years since quitting: 32.4  . Smokeless tobacco: Never Used  . Tobacco comment: quit in 1987   Substance and Sexual Activity  . Alcohol use: Never    Frequency: Never  . Drug use: Never  . Sexual activity: Not Currently  Lifestyle  . Physical activity:    Days per week: 0 days    Minutes per session: 0 min  . Stress: Not at all  Relationships  . Social connections:    Talks on phone: More than three times a  week    Gets together: More than three times a week    Attends religious service: Never    Active member of club or organization: No    Attends meetings of clubs or organizations: Never    Relationship status: Divorced  . Intimate partner violence:    Fear of current or ex partner: No    Emotionally abused: No    Physically abused: No    Forced sexual activity: No  Other Topics Concern  . Not on file  Social History Narrative   Has 3 daughters, 2 local      FAMILY HISTORY: Family History  Problem Relation Age of Onset  . Heart disease Mother   . Heart failure Mother   . Parkinson's disease Father   . Heart disease Sister   . Heart disease Son     ALLERGIES:  has No Known Allergies.  MEDICATIONS:  Current Facility-Administered Medications  Medication Dose Route Frequency Provider Last Rate Last Dose  . acetaminophen (TYLENOL) tablet 1,000 mg  1,000 mg Oral Q6H PRN Piscoya, Jose, MD      . amLODipine (NORVASC) tablet 2.5 mg  2.5 mg Oral Daily Piscoya, Jose, MD   2.5 mg at 06/28/18 8416  . enoxaparin (LOVENOX) injection 30 mg  30 mg Subcutaneous Q24H Piscoya, Jose, MD   30 mg at 06/28/18 0835  . famotidine (PEPCID) tablet 20 mg  20 mg Oral Daily Dustin Flock, MD   20 mg at 06/28/18 0835  . fluticasone furoate-vilanterol (BREO ELLIPTA) 200-25 MCG/INH 1 puff  1 puff Inhalation Daily Piscoya, Jose, MD   1 puff at 06/27/18 1000  . HYDROmorphone (DILAUDID) injection 0.5 mg  0.5 mg Intravenous Q4H PRN Piscoya, Jose, MD   0.5 mg at 06/28/18 0347  . ketorolac (TORADOL) 30 MG/ML injection 15 mg  15 mg Intravenous Q6H PRN Piscoya, Jose, MD      . lactated ringers infusion   Intravenous Continuous Piscoya, Jose, MD 75 mL/hr at 06/28/18 0600    . levothyroxine (SYNTHROID, LEVOTHROID) tablet 100 mcg  100 mcg Oral QAC breakfast Olean Ree, MD   100 mcg at 06/28/18 0835  . metoprolol tartrate (LOPRESSOR) tablet 50 mg  50 mg Oral BID Olean Ree, MD   50 mg at 06/28/18 6063  . ondansetron (ZOFRAN) tablet 4 mg  4 mg Oral Q6H PRN Piscoya, Jose, MD   4 mg at 06/27/18 0836   Or  . ondansetron (ZOFRAN) injection 4 mg  4 mg Intravenous Q6H PRN Olean Ree, MD   4 mg at 06/25/18 1736      .  PHYSICAL EXAMINATION:  Vitals:   06/27/18 1938 06/28/18 0440  BP: (!) 156/61 (!) 153/79  Pulse: 69 66  Resp: 18 16  Temp: 98 F (36.7 C) 98.2 F (36.8 C)  SpO2: 94% 95%   Filed Weights   06/25/18 0412 06/26/18 0420 06/28/18 0440  Weight: 96 lb  9.6 oz (43.8 kg) 98 lb 6.4 oz (44.6 kg) 102 lb 8 oz (46.5 kg)    GENERAL: Moderately built moderately nourished female patient in the bed no acute distress.  Accompanied by daughter.   EYES: no pallor or icterus OROPHARYNX: no thrush or ulceration. NECK: supple, no masses felt LYMPH:  no palpable lymphadenopathy in the cervical, axillary or inguinal regions LUNGS: decreased breath sounds to auscultation at bases and  No wheeze or crackles HEART/CVS: regular rate & rhythm and no murmurs; No lower extremity edema ABDOMEN: abdomen soft, non-tender and normal bowel  sounds.  Mild pain on deep palpation. Musculoskeletal:no cyanosis of digits and no clubbing  PSYCH: alert & oriented x 3 with fluent speech NEURO: no focal motor/sensory deficits SKIN:  no rashes or significant lesions  LABORATORY DATA:  I have reviewed the data as listed Lab Results  Component Value Date   WBC 16.3 (H) 06/28/2018   HGB 11.9 (L) 06/28/2018   HCT 36.8 06/28/2018   MCV 85.2 06/28/2018   PLT 287 06/28/2018   Recent Labs    05/19/18 1433  06/22/18 0424 06/25/18 0345 06/27/18 0526 06/28/18 0758  NA 137   < > 139 139 132* 133*  K 4.4   < > 4.1 3.7 3.9 4.4  CL 99   < > 105 108 98 97*  CO2 24   < > 27 25 24 27   GLUCOSE 91   < > 111* 91 149* 100*  BUN 17   < > 18 10 9 12   CREATININE 1.09*   < > 1.29* 1.00 0.97 0.97  CALCIUM 9.0   < > 8.8* 8.3* 8.7* 8.4*  GFRNONAA 46*   < > 36* 49* 51* 51*  GFRAA 53*   < > 42* 57* 59* 59*  PROT 6.4  --  6.5 5.5*  --   --   ALBUMIN 4.1  --  3.7 3.1*  --   --   AST 22  --  21 17  --   --   ALT 10  --  10 8  --   --   ALKPHOS 119*  --  94 72  --   --   BILITOT 0.3  --  0.4 0.6  --   --    < > = values in this interval not displayed.    RADIOGRAPHIC STUDIES: I have personally reviewed the radiological images as listed and agreed with the findings in the report. Ct Chest Wo Contrast  Result Date: 06/23/2018 CLINICAL DATA:  Past medical history of atrial fibrillation,  COPD and intestinal perforation. Presents to the emergency department with dark tarry stools. CT of abdomen revealed a mass at the appendix flexure. Staging workup. EXAM: CT CHEST WITHOUT CONTRAST TECHNIQUE: Multidetector CT imaging of the chest was performed following the standard protocol without IV contrast. COMPARISON:  None. FINDINGS: Cardiovascular: Aortic atherosclerosis. No thoracic aortic aneurysm. Heart size is within normal limits. No pericardial effusion. Three-vessel coronary artery calcifications. Mediastinum/Nodes: No mass or enlarged lymph nodes seen within the mediastinum or perihilar regions. Esophagus is unremarkable. Trachea is unremarkable. Lungs/Pleura: No pulmonary nodule or mass. Diffuse emphysematous change, moderate to severe in degree, upper lobe predominant. Associated mild scarring/fibrosis at the lung apices. No pneumonia, pleural effusion or pulmonary edema. Upper Abdomen: Limited images of the upper abdomen are unremarkable. Musculoskeletal: Mild degenerative spondylosis throughout the scoliotic thoracolumbar spine. No acute or suspicious osseous finding. IMPRESSION: 1. No evidence of metastatic disease within the chest. 2. No acute findings within the chest. 3. Emphysema, moderate to severe in degree. 4. Aortic atherosclerosis. Particularly advanced atherosclerosis of the lower descending thoracic aorta and upper abdominal aorta. 5. Three-vessel coronary artery calcifications. Heart size is normal. Aortic Atherosclerosis (ICD10-I70.0) and Emphysema (ICD10-J43.9). Electronically Signed   By: Franki Cabot M.D.   On: 06/23/2018 15:12   Ct Angio Abd/pel W And/or Wo Contrast  Result Date: 06/22/2018 CLINICAL DATA:  GI bleed. EXAM: CTA ABDOMEN AND PELVIS wITHOUT AND WITH CONTRAST TECHNIQUE: Multidetector CT imaging of the abdomen and pelvis was performed using the standard protocol  during bolus administration of intravenous contrast. Multiplanar reconstructed images and MIPs were  obtained and reviewed to evaluate the vascular anatomy. CONTRAST:  167mL ISOVUE-370 IOPAMIDOL (ISOVUE-370) INJECTION 76% COMPARISON:  None. FINDINGS: VASCULAR Aorta: Diffuse dense irregular calcified and noncalcified atheromatous plaque, coarse plaque causes approximately 50% luminal narrowing of the upper abdominal aorta just proximal to the SMA. No aneurysm. No evidence of vasculitis. Celiac: Severe stenosis at the origin due to calcified and noncalcified plaque. Early branching of right and left hepatic arteries versus aortic origin of the right hepatic artery. SMA: Patent without evidence of aneurysm, dissection, vasculitis or significant stenosis. Renals: Plaque at the origin of the right renal artery causes severe stenosis with near complete occlusion, tiny vessel into an atrophic right kidney. Moderate plaque at the origin of the left renal artery causes approximately 50% stenosis. IMA: Patent without evidence of aneurysm, dissection, vasculitis or significant stenosis. Inflow: Moderate plaque that evidence of aneurysm, dissection, vasculitis or significant stenosis. Proximal Outflow: Bilateral common femoral and visualized portions of the superficial and profunda femoral arteries are patent without evidence of aneurysm, dissection, vasculitis or significant stenosis. Veins: Portal and mesenteric veins appear patent and venous phase imaging. Iliac veins and IVC are unremarkable. Review of the MIP images confirms the above findings. NON-VASCULAR Lower chest: Emphysema without consolidation or pleural effusion. Scattered atelectasis. There are coronary artery calcifications. Small hiatal hernia. Hepatobiliary: No focal liver abnormality is seen. No gallstones, gallbladder wall thickening, or biliary dilatation. Pancreas: Atrophic parenchyma. No ductal dilatation or inflammation. Spleen: Normal in size without focal abnormality. Adrenals/Urinary Tract: No adrenal nodule. Marked right renal atrophy, right kidney  is poorly perfused. 11 mm cyst in the mid right kidney. No left hydronephrosis or perinephric edema. Urinary bladder is partially distended. No bladder wall thickening. Stomach/Bowel: Small hiatal hernia. Stomach is nondistended. There is no contrast extravasation into the GI tract to localize site of GI bleed. Wall thickening of the cecum and ascending colon, with masslike thickening at the hepatic flexure, for example image 87 series 7. This area of masslike thickening spans at least 5 cm. No pericolonic stranding. No small bowel dilatation or obstruction. Moderate stool in the distal colon. Distal colonic diverticulosis without diverticulitis. Appendix not definitively visualized. Lymphatic: No enlarged abdominal or pelvic lymph nodes. Reproductive: Status post hysterectomy. No adnexal masses. Other: No free air or ascites. Musculoskeletal: Scoliotic curvature and multilevel degenerative change in the spine. There are no acute or suspicious osseous abnormalities. IMPRESSION: VASCULAR 1. No active extravasation into the GI tract to localize site of GI bleed. 2. Diffuse calcified and noncalcified atheromatous plaque throughout the abdominal aorta, plaque causes approximately 50% narrowing of the upper abdominal aorta at the level of the SMA. 3. 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 Hepatic or right hepatic artery likely rises directly from the aorta, normal variant anatomy. 4. High-grade stenosis at the origin of the right renal artery with associated right renal atrophy and poor perfusion of the right kidney. NON-VASCULAR 1. Colonic wall thickening of the cecum and ascending colon, with masslike thickening at the hepatic flexure. Hepatic flexure findings are suspicious for colonic malignancy, with possible ascending colitis. Recommend direct visualization with colonoscopy. 2. Distal colonic diverticulosis without diverticulitis. Aortic  Atherosclerosis (ICD10-I70.0) and Emphysema (ICD10-J43.9). Electronically Signed   By: Keith Rake M.D.   On: 06/22/2018 06:04    ASSESSMENT & PLAN:   #84 year old female patient is currently admitted to the hospital GI bleed noted to  have colon cancer/hepatic flexure is currently status post hemicolectomy.  #Colon cancer-poorly differentiated adenocarcinoma based on colonoscopy/biopsy.  Surgical pathology is pending at this time.  Discussed with the patient/and daughter Debbie-that patient's management/prognosis will depend upon staging/surgical pathology.  Discussed that adjuvant chemotherapy is generally recommended in a stage III colon cancer.  However given patient's age/obvious evidence of metastatic disease on the CT scan-she is less likely to receive any chemotherapy.   #Mild anemia iron deficiency-secondary to GI bleed/colon cancer-patient may benefit from oral iron once acute issues resolve.  # Abdominal pain-incisional status post surgery.  Improving.  Defer to surgery.  #Discussed the patient need to follow-up in approximately a week or so post discharge to review the final pathology/next plan of care.  Patient and daughter [name] in agreement.  My contact information was given.   Thank you Dr.Pisocya for allowing me to participate in the care of your pleasant patient. Please do not hesitate to contact me with questions or concerns in the interim.  All questions were answered. The patient knows to call the clinic with any problems, questions or concerns.    Cammie Sickle, MD 06/28/2018 11:37 AM

## 2018-06-28 NOTE — Plan of Care (Signed)
The patient has been tolerating a soft diet currently. Problem: Spiritual Needs Goal: Ability to function at adequate level 06/28/2018 1954 by Myles Rosenthal, Cory Roughen, RN Outcome: Progressing 06/28/2018 1634 by Myles Rosenthal, Cory Roughen, RN Outcome: Progressing   Problem: Education: Goal: Knowledge of General Education information will improve Description Including pain rating scale, medication(s)/side effects and non-pharmacologic comfort measures 06/28/2018 1954 by Ronna Polio, RN Outcome: Progressing 06/28/2018 1634 by Myles Rosenthal, Cory Roughen, RN Outcome: Progressing   Problem: Health Behavior/Discharge Planning: Goal: Ability to manage health-related needs will improve 06/28/2018 1954 by Ronna Polio, RN Outcome: Progressing 06/28/2018 1634 by Myles Rosenthal, Cory Roughen, RN Outcome: Progressing   Problem: Clinical Measurements: Goal: Ability to maintain clinical measurements within normal limits will improve 06/28/2018 1954 by Ronna Polio, RN Outcome: Progressing 06/28/2018 1634 by Myles Rosenthal, Cory Roughen, RN Outcome: Progressing Goal: Will remain free from infection 06/28/2018 1954 by Ronna Polio, RN Outcome: Progressing 06/28/2018 1634 by Myles Rosenthal, Cory Roughen, RN Outcome: Progressing Goal: Diagnostic test results will improve 06/28/2018 1954 by Ronna Polio, RN Outcome: Progressing 06/28/2018 1634 by Myles Rosenthal, Cory Roughen, RN Outcome: Progressing Goal: Respiratory complications will improve 06/28/2018 1954 by Ronna Polio, RN Outcome: Progressing 06/28/2018 1634 by Myles Rosenthal, Cory Roughen, RN Outcome: Progressing Goal: Cardiovascular complication will be avoided 06/28/2018 1954 by Ronna Polio, RN Outcome: Progressing 06/28/2018 1634 by Ronna Polio, RN Outcome: Progressing   Problem: Activity: Goal: Risk for activity intolerance will decrease 06/28/2018 1954 by  Myles Rosenthal, Cory Roughen, RN Outcome: Progressing 06/28/2018 1634 by Myles Rosenthal, Cory Roughen, RN Outcome: Progressing   Problem: Nutrition: Goal: Adequate nutrition will be maintained 06/28/2018 1954 by Myles Rosenthal, Cory Roughen, RN Outcome: Progressing 06/28/2018 1634 by Myles Rosenthal, Cory Roughen, RN Outcome: Progressing   Problem: Coping: Goal: Level of anxiety will decrease 06/28/2018 1954 by Ronna Polio, RN Outcome: Progressing 06/28/2018 1634 by Myles Rosenthal, Cory Roughen, RN Outcome: Progressing   Problem: Elimination: Goal: Will not experience complications related to bowel motility 06/28/2018 1954 by Ronna Polio, RN Outcome: Progressing 06/28/2018 1634 by Myles Rosenthal, Cory Roughen, RN Outcome: Progressing Goal: Will not experience complications related to urinary retention 06/28/2018 1954 by Ronna Polio, RN Outcome: Progressing 06/28/2018 1634 by Myles Rosenthal, Cory Roughen, RN Outcome: Progressing   Problem: Pain Managment: Goal: General experience of comfort will improve 06/28/2018 1954 by Ronna Polio, RN Outcome: Progressing 06/28/2018 1634 by Myles Rosenthal, Cory Roughen, RN Outcome: Progressing   Problem: Safety: Goal: Ability to remain free from injury will improve 06/28/2018 1954 by Ronna Polio, RN Outcome: Progressing 06/28/2018 1634 by Myles Rosenthal, Cory Roughen, RN Outcome: Progressing   Problem: Skin Integrity: Goal: Risk for impaired skin integrity will decrease 06/28/2018 1954 by Ronna Polio, RN Outcome: Progressing 06/28/2018 1634 by Myles Rosenthal, Cory Roughen, RN Outcome: Progressing   Problem: Education: Goal: Ability to identify signs and symptoms of gastrointestinal bleeding will improve 06/28/2018 1954 by Ronna Polio, RN Outcome: Progressing 06/28/2018 1634 by Ronna Polio, RN Outcome: Progressing   Problem: Bowel/Gastric: Goal: Will show no signs and symptoms  of gastrointestinal bleeding 06/28/2018 1954 by Myles Rosenthal, Cory Roughen, RN Outcome: Progressing 06/28/2018 1634 by Myles Rosenthal, Cory Roughen, RN Outcome: Progressing   Problem: Fluid Volume: Goal: Will show no signs and symptoms of excessive bleeding 06/28/2018 1954 by Ronna Polio, RN Outcome: Progressing 06/28/2018 1634 by Myles Rosenthal, Cory Roughen, RN Outcome: Progressing   Problem: Clinical Measurements: Goal: Complications related to the disease process, condition or treatment will be avoided or minimized 06/28/2018 1954 by Myles Rosenthal,  Cory Roughen, RN Outcome: Progressing 06/28/2018 1634 by Myles Rosenthal, Cory Roughen, RN Outcome: Progressing

## 2018-06-29 LAB — BASIC METABOLIC PANEL
Anion gap: 9 (ref 5–15)
BUN: 13 mg/dL (ref 8–23)
CO2: 26 mmol/L (ref 22–32)
Calcium: 8.6 mg/dL — ABNORMAL LOW (ref 8.9–10.3)
Chloride: 97 mmol/L — ABNORMAL LOW (ref 98–111)
Creatinine, Ser: 0.96 mg/dL (ref 0.44–1.00)
GFR calc Af Amer: 59 mL/min — ABNORMAL LOW (ref >60)
GFR calc non Af Amer: 51 mL/min — ABNORMAL LOW (ref >60)
Glucose, Bld: 89 mg/dL (ref 70–99)
Potassium: 3.9 mmol/L (ref 3.5–5.1)
Sodium: 132 mmol/L — ABNORMAL LOW (ref 135–145)

## 2018-06-29 LAB — CBC
HCT: 36.9 % (ref 36.0–46.0)
Hemoglobin: 11.9 g/dL — ABNORMAL LOW (ref 12.0–15.0)
MCH: 27.5 pg (ref 26.0–34.0)
MCHC: 32.2 g/dL (ref 30.0–36.0)
MCV: 85.4 fL (ref 80.0–100.0)
Platelets: 250 10*3/uL (ref 150–400)
RBC: 4.32 MIL/uL (ref 3.87–5.11)
RDW: 14.1 % (ref 11.5–15.5)
WBC: 11 10*3/uL — ABNORMAL HIGH (ref 4.0–10.5)
nRBC: 0 % (ref 0.0–0.2)

## 2018-06-29 NOTE — Progress Notes (Signed)
Patient ID: [name], female   DOB: [birth year], 84 y.o.   MRN: [id number]  [facility] PROGRESS NOTE  [name] BHA:[id number] DOB: 10/18/1927 DOA: 06/22/2018 PCP: Valerie Roys, DO  HPI/Subjective: Patient stated she has abdominal pain 8 out of 10 intensity when she moves.  No abdominal pain if she does not move.  She tolerated soft diet last night and has had bowel movements.  She states she got a little dizzy yesterday when she stood.  Objective: Vitals:   06/28/18 2120 06/29/18 0441  BP: (!) 137/59 129/61  Pulse: 66 60  Resp: 18 18  Temp: 98 F (36.7 C) 98.3 F (36.8 C)  SpO2: 94% 94%    Filed Weights   06/26/18 0420 06/28/18 0440 06/29/18 0500  Weight: 44.6 kg 46.5 kg 43.7 kg    ROS: Review of Systems  Constitutional: Negative for chills and fever.  Eyes: Negative for blurred vision.  Respiratory: Negative for cough and shortness of breath.   Cardiovascular: Negative for chest pain.  Gastrointestinal: Positive for abdominal pain. Negative for constipation, diarrhea, nausea and vomiting.  Genitourinary: Negative for dysuria.  Musculoskeletal: Negative for joint pain.  Neurological: Positive for dizziness. Negative for headaches.   Exam: Physical Exam  HENT:  Nose: No mucosal edema.  Mouth/Throat: No oropharyngeal exudate or posterior oropharyngeal edema.  Eyes: Pupils are equal, round, and reactive to light. Conjunctivae, EOM and lids are normal.  Neck: No JVD present. Carotid bruit is not present. No edema present. No thyroid mass and no thyromegaly present.  Cardiovascular: S1 normal and S2 normal. Exam reveals no gallop.  No murmur heard. Pulses:      Dorsalis pedis pulses are 2+ on the right side, and 2+ on the left side.  Respiratory: No respiratory distress. She has no wheezes. She has no rhonchi. She has no rales.  GI: Soft. Bowel sounds are normal. There is tenderness.  Musculoskeletal:       Right shoulder: She exhibits no  swelling.  Lymphadenopathy:    She has no cervical adenopathy.  Neurological: She is alert. No cranial nerve deficit.  Skin: Skin is warm. No rash noted. Nails show no clubbing.  Psychiatric: She has a normal mood and affect.      Data Reviewed: Basic Metabolic Panel: Recent Labs  Lab 06/25/18 0345 06/27/18 0526 06/28/18 0758 06/29/18 0350  NA 139 132* 133* 132*  K 3.7 3.9 4.4 3.9  CL 108 98 97* 97*  CO2 25 24 27 26   GLUCOSE 91 149* 100* 89  BUN 10 9 12 13   CREATININE 1.00 0.97 0.97 0.96  CALCIUM 8.3* 8.7* 8.4* 8.6*   Liver Function Tests: Recent Labs  Lab 06/25/18 0345  AST 17  ALT 8  ALKPHOS 72  BILITOT 0.6  PROT 5.5*  ALBUMIN 3.1*   CBC: Recent Labs  Lab 06/22/18 1200 06/25/18 0345 06/27/18 0526 06/28/18 0758 06/29/18 0350  WBC  --  7.4 21.8* 16.3* 11.0*  NEUTROABS  --  3.9 19.5*  --   --   HGB 12.3 11.4* 13.5 11.9* 11.9*  HCT  --  35.8* 41.3 36.8 36.9  MCV  --  86.7 84.8 85.2 85.4  PLT  --  229 284 287 250     Recent Results (from the past 240 hour(s))  Surgical PCR screen     Status: None   Collection Time: 06/26/18  1:27 AM  Result Value Ref Range Status   MRSA, PCR NEGATIVE NEGATIVE Final   Staphylococcus aureus  NEGATIVE NEGATIVE Final    Comment: (NOTE) The Xpert SA Assay (FDA approved for NASAL specimens in patients 15 years of age and older), is one component of a comprehensive surveillance program. It is not intended to diagnose infection nor to guide or monitor treatment. Performed at [facility], [address] [place], [place] [address]       Scheduled Meds: . amLODipine  2.5 mg Oral Daily  . aspirin EC  81 mg Oral Daily  . enoxaparin (LOVENOX) injection  30 mg Subcutaneous Q24H  . famotidine  20 mg Oral Daily  . fluticasone furoate-vilanterol  1 puff Inhalation Daily  . levothyroxine  100 mcg Oral QAC breakfast  . metoprolol tartrate  50 mg Oral BID   Continuous  Infusions:   Assessment/Plan:  1. 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  As per surgery close to discharge so I will sign off at this time. 2. Dizziness with standing.  Check orthostatic vital signs.  If orthostatic I will get rid of the Norvasc. 3. Melena.  Hemoglobin stable 4. Atrial fibrillation.  Continue metoprolol.  Aspirin on hold after surgery.  Can restart upon discharge if okay with surgery for stroke prevention. 5. Hypothyroidism unspecified on levothyroxine 6. History of COPD.  Respiratory status stable 7. 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 Hypertension metoprolol and Norvasc.  Check orthostatic vital signs.  If orthostatic I will get rid of the Norvasc. 9. Weakness.  Physical therapy recommends rehab the last time they saw the patient.  Hopefully they will reevaluate today.  Code Status:     Code Status Orders  (From admission, onward)         Start     Ordered   06/22/18 1035  Full code  Continuous     06/22/18 1034        Code Status History    This patient has a current code status but no historical code status.    Advance Directive Documentation     Most Recent Value  Type of Advance Directive  Living will  Pre-existing out of facility DNR order (yellow form or pink MOST form)  -  "MOST" Form in Place?  -     Family Communication: As per surgery Disposition Plan: Rehab versus home with home health depending on physical therapy reevaluation  Consultants:  General surgery  Oncology  Procedures:  Colon resection  Time spent: 26 minutes.  Case discussed with general surgery.  I will sign off since the patient will be getting out of the hospital soon.  I am here to day and tomorrow if there are any further questions.  Kore Madlock [employer]

## 2018-06-29 NOTE — Progress Notes (Signed)
06/29/2018  Subjective: Patient is 3 Days Post-Op s/p laparoscopic extended right colectomy.  No acute events.  Having BM and advanced to soft diet.  Vital signs: Temp:  [97.7 F (36.5 C)-98.4 F (36.9 C)] 97.7 F (36.5 C) (10/27 1234) Pulse Rate:  [60-101] 62 (10/27 1234) Resp:  [17-18] 17 (10/27 1234) BP: (123-149)/(59-70) 127/61 (10/27 1234) SpO2:  [94 %-97 %] 96 % (10/27 1234) Weight:  [43.7 kg] 43.7 kg (10/27 0500)   Intake/Output: 10/26 0701 - 10/27 0700 In: 1620.8 [P.O.:360; I.V.:1260.8] Out: 1800 [Urine:1800] Last BM Date: 06/28/18  Physical Exam: Constitutional: No acute distress Abdomen: soft, nondistended, appropriately tender to palpation.  incisiosn clean, dry intact with staples.  Labs:  Recent Labs    06/28/18 0758 06/29/18 0350  WBC 16.3* 11.0*  HGB 11.9* 11.9*  HCT 36.8 36.9  PLT 287 250   Recent Labs    06/28/18 0758 06/29/18 0350  NA 133* 132*  K 4.4 3.9  CL 97* 97*  CO2 27 26  GLUCOSE 100* 89  BUN 12 13  CREATININE 0.97 0.96  CALCIUM 8.4* 8.6*   No results for input(s): LABPROT, INR in the last 72 hours.  Imaging: No results found.  Assessment/Plan: This is a 84 y.o. female s/p laparoscopic extended right colectomy.  --continue soft diet, ambulation, and pain control. --rehab bed is pending.  Will try to discharge to rehab tomorrow.   Melvyn Neth, [facility]

## 2018-06-29 NOTE — Progress Notes (Signed)
Talked to PT. They indicated that will most likely see the patient tomorrow since they have a busy schedule today.

## 2018-06-29 NOTE — Progress Notes (Signed)
Spoke with Santiago Glad- Education officer, [employer]. She indicates that  there is a bed available for the patient at [facility] yet it is still pending. She is hopping she will be able to go tomorrow.

## 2018-06-29 NOTE — Plan of Care (Signed)
The patient has been stable. Orthostatic vital signs obtained. The patient has been ambulating in the room with one person assist with a walker. Pain managed with PRN pain meds. Incision sites monitored. No complications today. Had a small bowel movement. Voiding. SNF pending. Possible D/C tomorrow.   Problem: Spiritual Needs Goal: Ability to function at adequate level Outcome: Progressing   Problem: Education: Goal: Knowledge of General Education information will improve Description Including pain rating scale, medication(s)/side effects and non-pharmacologic comfort measures Outcome: Progressing   Problem: Health Behavior/Discharge Planning: Goal: Ability to manage health-related needs will improve Outcome: Progressing   Problem: Clinical Measurements: Goal: Ability to maintain clinical measurements within normal limits will improve Outcome: Progressing Goal: Will remain free from infection Outcome: Progressing Goal: Diagnostic test results will improve Outcome: Progressing Goal: Respiratory complications will improve Outcome: Progressing Goal: Cardiovascular complication will be avoided Outcome: Progressing   Problem: Activity: Goal: Risk for activity intolerance will decrease Outcome: Progressing   Problem: Nutrition: Goal: Adequate nutrition will be maintained Outcome: Progressing   Problem: Coping: Goal: Level of anxiety will decrease Outcome: Progressing   Problem: Elimination: Goal: Will not experience complications related to bowel motility Outcome: Progressing Goal: Will not experience complications related to urinary retention Outcome: Progressing   Problem: Pain Managment: Goal: General experience of comfort will improve Outcome: Progressing   Problem: Safety: Goal: Ability to remain free from injury will improve Outcome: Progressing   Problem: Skin Integrity: Goal: Risk for impaired skin integrity will decrease Outcome: Progressing   Problem:  Education: Goal: Ability to identify signs and symptoms of gastrointestinal bleeding will improve Outcome: Progressing   Problem: Bowel/Gastric: Goal: Will show no signs and symptoms of gastrointestinal bleeding Outcome: Progressing   Problem: Fluid Volume: Goal: Will show no signs and symptoms of excessive bleeding Outcome: Progressing   Problem: Clinical Measurements: Goal: Complications related to the disease process, condition or treatment will be avoided or minimized Outcome: Progressing

## 2018-06-30 ENCOUNTER — Other Ambulatory Visit: Payer: Self-pay | Admitting: Internal Medicine

## 2018-06-30 DIAGNOSIS — E440 Moderate protein-calorie malnutrition: Secondary | ICD-10-CM

## 2018-06-30 DIAGNOSIS — C183 Malignant neoplasm of hepatic flexure: Secondary | ICD-10-CM

## 2018-06-30 MED ORDER — ENSURE ENLIVE PO LIQD
237.0000 mL | Freq: Two times a day (BID) | ORAL | Status: DC
  Administered 2018-06-30 – 2018-07-01 (×2): 237 mL via ORAL

## 2018-06-30 MED ORDER — OCUVITE-LUTEIN PO CAPS
1.0000 | ORAL_CAPSULE | Freq: Every day | ORAL | Status: DC
  Administered 2018-06-30 – 2018-07-01 (×2): 1 via ORAL
  Filled 2018-06-30 (×3): qty 1

## 2018-06-30 NOTE — Progress Notes (Signed)
Physical Therapy Treatment Patient Details Name: [name] MRN: [id number] DOB: 06/13/1928 Today's Date: 06/30/2018    History of Present Illness Pt is an 84 year old female who presented with melena/GI bleed and was diagnosed with adenocarcinoma of the hepatic flexure.  Pt is now s/p laparoscopic extended right colectomy.  PMH also includes: COPD, aortic atherosclerosis, A-fib, hypothyroidism, and CAD.    PT Comments    Pt presents with deficits in strength, transfers, mobility, gait, balance, and activity tolerance.  Pt required mod A with bed mobility tasks during log roll training for decreased abdominal strain.  Pt required min A with transfers from an elevated EOB and was able to amb 1 x 3' and 1 x 8' with a RW.  Pt's SpO2 remained 97-98% on room air pre and post gait with HR increasing from 68 bpm at rest to 88 bpm after ambulation.  Pt reported no increase in baseline abdominal incision pain during the session with pain remaining 5/10 throughout, nsg aware. Pt will benefit from PT services in a SNF setting upon discharge to safely address above deficits for decreased caregiver assistance and eventual return to PLOF.     Follow Up Recommendations  SNF     Equipment Recommendations  None recommended by PT    Recommendations for Other Services       Precautions / Restrictions Precautions Precautions: Fall Precaution Comments: Abdominal incisions Restrictions Weight Bearing Restrictions: No    Mobility  Bed Mobility Overal bed mobility: Needs Assistance Bed Mobility: Supine to Sit     Supine to sit: Mod assist     General bed mobility comments: Log roll technique education provided with pt requiring extensive assistance for BLEs in and out of the bed and for trunk to full upright position  Transfers Overall transfer level: Needs assistance Equipment used: Rolling walker (2 wheeled) Transfers: Sit to/from Stand Sit to Stand: Min assist;From elevated surface         General transfer comment: Min A to stand with mod verbal cues for hand placement and general sequencing  Ambulation/Gait   Gait Distance (Feet): 8 Feet Assistive device: Rolling walker (2 wheeled) Gait Pattern/deviations: Step-through pattern;Decreased step length - right;Decreased step length - left;Trunk flexed Gait velocity: Decreased   General Gait Details: Short B step length and very slow cadence but steady without LOB   Stairs             Wheelchair Mobility    Modified Rankin (Stroke Patients Only)       Balance Overall balance assessment: Mild deficits observed, not formally tested                                          Cognition Arousal/Alertness: Awake/alert Behavior During Therapy: WFL for tasks assessed/performed Overall Cognitive Status: Within Functional Limits for tasks assessed                                        Exercises Total Joint Exercises Ankle Circles/Pumps: AROM;Both;5 reps;10 reps Quad Sets: Strengthening;Both;5 reps;10 reps Gluteal Sets: Strengthening;Both;10 reps;5 reps Towel Squeeze: Strengthening;Both;5 reps;10 reps Heel Slides: AAROM;Both;5 reps Long Arc Quad: AROM;Both;10 reps;5 reps Knee Flexion: AROM;Both;10 reps;5 reps Marching in Standing: AROM;Both;10 reps;Seated;Standing Other Exercises Other Exercises: Log roll technique training with sup to sit    General  Comments        Pertinent Vitals/Pain Pain Assessment: 0-10 Pain Score: 5  Pain Location: abdominal area Pain Descriptors / Indicators: Aching;Operative site guarding;Sore Pain Intervention(s): Monitored during session    Home Living                      Prior Function            PT Goals (current goals can now be found in the care plan section) Progress towards PT goals: Progressing toward goals    Frequency    Min 2X/week      PT Plan Current plan remains appropriate    Co-evaluation               AM-PAC PT "6 Clicks" Daily Activity  Outcome Measure                   End of Session Equipment Utilized During Treatment: Gait belt Activity Tolerance: Patient limited by fatigue Patient left: in chair;with call bell/phone within reach;with chair alarm set;with SCD's reapplied;with nursing/sitter in room Nurse Communication: Mobility status PT Visit Diagnosis: Unsteadiness on feet (R26.81);Muscle weakness (generalized) (M62.81);Difficulty in walking, not elsewhere classified (R26.2)     Time: 1435-1500 PT Time Calculation (min) (ACUTE ONLY): 25 min  Charges:  $Therapeutic Exercise: 8-22 mins $Therapeutic Activity: 8-22 mins                     D. Scott Karrington Studnicka PT, DPT 06/30/18, 3:01 PM

## 2018-06-30 NOTE — Care Management Important Message (Signed)
Copy of signed IM left with patient in room.  

## 2018-06-30 NOTE — Progress Notes (Signed)
Initial Nutrition Assessment  DOCUMENTATION CODES:   Non-severe (moderate) malnutrition in context of chronic illness  INTERVENTION:  Ensure Enlive po BID, each supplement provides 350 kcal and 20 grams of protein Magic cup TID with meals, each supplement provides 290 kcal and 9 grams of protein Carnation Instant Breakfast MVI   NUTRITION DIAGNOSIS:   Moderate Malnutrition related to altered GI function, chronic illness(GI bleed, adenocarcinoma of colon) as evidenced by moderate muscle depletion, moderate fat depletion, energy intake < 75% for > 7 days.   GOAL:   Patient will meet greater than or equal to 90% of their needs   MONITOR:   PO intake, Supplement acceptance, Diet advancement  REASON FOR ASSESSMENT:   Malnutrition Screening Tool, Other (Comment)(low BMI)    ASSESSMENT:  84yo pt ED to hospital admit on 10/20 c/o black tarry stools. EDG/Colonoscopy on 10/23 revealing hepatic flexure mass; biopsies returned as adenocarcinoma of colon.  Laparoscopic rt colectomy 10/24. PMH: COPD on O2, CHF, Diverticulosis, DVT, Hysterectomy  Pt watching television at time of visit and reports feeling as good as she can today. Pt reports eating 100% of breakfast this morning which consisted of 1 egg and toast. When at home pt stated she eats 3 meals/day. Breakfast is a bowl of cereal or  Chocolate carnation instant breakfast, sometimes she has both. Lunch is a sandwich with a cup of soup or maybe chips. Pt stated she lives alone and cooking for 1 is hard to do so she eats dinner most everyone night at a restaurant. Pt could not recall restaurants she visited, but she did not care for fast food.  Pt recalls UBW of 90lbs for as long as she can remember and denies any changes to appetite or tastes. Pt wears dentures and denies any chewing or swallowing difficulties.  Pt agreeable to ONS in chocolate and offered carnation instant breakfast as an addition to breakfast tray to make pt feel more  at home.   NUTRITION - FOCUSED PHYSICAL EXAM:    Most Recent Value  Orbital Region  Mild depletion  Upper Arm Region  Moderate depletion  Thoracic and Lumbar Region  Moderate depletion  Buccal Region  Mild depletion  Temple Region  Moderate depletion  Clavicle and Acromion Bone Region  Moderate depletion  Scapular Bone Region  Mild depletion  Dorsal Hand  Mild depletion  Patellar Region  Moderate depletion  Anterior Thigh Region  Mild depletion  Posterior Calf Region  Moderate depletion  Edema (RD Assessment)  None  Hair  Reviewed  Eyes  Reviewed  Mouth  Reviewed  Skin  Reviewed [generalized, dry, flaky]  Nails  Unable to assess [painted]       Diet Order:  61.6% x 6 recorded meals Diet Order            DIET SOFT Room service appropriate? Yes; Fluid consistency: Thin  Diet effective now            10/20:NPO 10/21: NPO, FL, HH 10/23: CL 10/24: NPO, CL 10/26: Soft  EDUCATION NEEDS:   No education needs have been identified at this time  Skin:  Skin Assessment: Reviewed RN Assessment(closed incision; left abdomen, moisture; medial buttocks)  Last BM:  10/28; Type 7 w/ some solid pieces, Small, green  Height:   Ht Readings from Last 1 Encounters:  06/22/18 5' (1.524 m)    Weight: 93.9lbs  Wt Readings from Last 1 Encounters:  06/30/18 42.7 kg    Ideal Body Weight:  45.5 kg (100lbs)  BMI:  Body mass index is 18.4 kg/m.  Estimated Nutritional Needs:   Kcal:  5868-2574  Protein:  56-77  Fluid:  1.2L    Lajuan Lines, RD, LDN  After Hours/Weekend Pager: [phone]

## 2018-06-30 NOTE — Progress Notes (Signed)
06/30/2018  Subjective: Patient is 4 Days Post-Op s/p laparoscopic extended right colectomy.  No acute events overnight.  Patient doing well, tolerating soft diet, having bowel function, and her pain is well controlled.  Vital signs: Temp:  [98 F (36.7 C)-98.4 F (36.9 C)] 98.1 F (36.7 C) (10/28 1143) Pulse Rate:  [61-70] 68 (10/28 1447) Resp:  [17-18] 18 (10/28 1143) BP: (139-158)/(60-79) 140/64 (10/28 1143) SpO2:  [94 %-98 %] 98 % (10/28 1447) Weight:  [42.7 kg] 42.7 kg (10/28 0500)   Intake/Output: 10/27 0701 - 10/28 0700 In: 120 [P.O.:120] Out: 500 [Urine:500] Last BM Date: 06/30/18  Physical Exam: Constitutional: No acute distress Abdomen:  Soft, non-distended, non-tender to palpation.  Incisions are clean, dry, and intact with staples in place.  Labs:  Recent Labs    06/28/18 0758 06/29/18 0350  WBC 16.3* 11.0*  HGB 11.9* 11.9*  HCT 36.8 36.9  PLT 287 250   Recent Labs    06/28/18 0758 06/29/18 0350  NA 133* 132*  K 4.4 3.9  CL 97* 97*  CO2 27 26  GLUCOSE 100* 89  BUN 12 13  CREATININE 0.97 0.96  CALCIUM 8.4* 8.6*   No results for input(s): LABPROT, INR in the last 72 hours.  Imaging: No results found.  Assessment/Plan: This is a 84 y.o. female s/p laparoscopic extended right colectomy.  --Currently pending insurance approval for rehab bed. --continue diet, ambulation, pain medication. --Discharge when rehab bed approved.    Melvyn Neth, [facility]

## 2018-06-30 NOTE — Plan of Care (Signed)
The patient has been stable. No nausea. Pain managed with PRN pain meds. Awaiting for approval of SNF. PT has worked with the patient this evening and the patient is currently sitting up in the chair.  Problem: Spiritual Needs Goal: Ability to function at adequate level Outcome: Progressing   Problem: Education: Goal: Knowledge of General Education information will improve Description Including pain rating scale, medication(s)/side effects and non-pharmacologic comfort measures Outcome: Progressing   Problem: Health Behavior/Discharge Planning: Goal: Ability to manage health-related needs will improve Outcome: Progressing   Problem: Clinical Measurements: Goal: Ability to maintain clinical measurements within normal limits will improve Outcome: Progressing Goal: Will remain free from infection Outcome: Progressing Goal: Diagnostic test results will improve Outcome: Progressing Goal: Respiratory complications will improve Outcome: Progressing Goal: Cardiovascular complication will be avoided Outcome: Progressing   Problem: Activity: Goal: Risk for activity intolerance will decrease Outcome: Progressing   Problem: Nutrition: Goal: Adequate nutrition will be maintained Outcome: Progressing   Problem: Coping: Goal: Level of anxiety will decrease Outcome: Progressing   Problem: Elimination: Goal: Will not experience complications related to bowel motility Outcome: Progressing Goal: Will not experience complications related to urinary retention Outcome: Progressing   Problem: Pain Managment: Goal: General experience of comfort will improve Outcome: Progressing   Problem: Safety: Goal: Ability to remain free from injury will improve Outcome: Progressing   Problem: Skin Integrity: Goal: Risk for impaired skin integrity will decrease Outcome: Progressing   Problem: Education: Goal: Ability to identify signs and symptoms of gastrointestinal bleeding will  improve Outcome: Progressing   Problem: Bowel/Gastric: Goal: Will show no signs and symptoms of gastrointestinal bleeding Outcome: Progressing   Problem: Fluid Volume: Goal: Will show no signs and symptoms of excessive bleeding Outcome: Progressing   Problem: Clinical Measurements: Goal: Complications related to the disease process, condition or treatment will be avoided or minimized Outcome: Progressing

## 2018-06-30 NOTE — Clinical Social Work Note (Signed)
[facility] is still awaiting auth from [employer] as of this afternoon. Shela Leff MSW,LCSW [phone]

## 2018-07-01 DIAGNOSIS — I2510 Atherosclerotic heart disease of native coronary artery without angina pectoris: Secondary | ICD-10-CM | POA: Diagnosis not present

## 2018-07-01 DIAGNOSIS — D692 Other nonthrombocytopenic purpura: Secondary | ICD-10-CM | POA: Diagnosis not present

## 2018-07-01 DIAGNOSIS — I480 Paroxysmal atrial fibrillation: Secondary | ICD-10-CM | POA: Diagnosis not present

## 2018-07-01 DIAGNOSIS — K921 Melena: Secondary | ICD-10-CM | POA: Diagnosis not present

## 2018-07-01 DIAGNOSIS — E038 Other specified hypothyroidism: Secondary | ICD-10-CM | POA: Diagnosis not present

## 2018-07-01 DIAGNOSIS — I471 Supraventricular tachycardia: Secondary | ICD-10-CM | POA: Diagnosis not present

## 2018-07-01 DIAGNOSIS — I10 Essential (primary) hypertension: Secondary | ICD-10-CM | POA: Diagnosis not present

## 2018-07-01 DIAGNOSIS — Z48815 Encounter for surgical aftercare following surgery on the digestive system: Secondary | ICD-10-CM | POA: Diagnosis not present

## 2018-07-01 DIAGNOSIS — D649 Anemia, unspecified: Secondary | ICD-10-CM | POA: Diagnosis not present

## 2018-07-01 DIAGNOSIS — C183 Malignant neoplasm of hepatic flexure: Secondary | ICD-10-CM | POA: Diagnosis not present

## 2018-07-01 DIAGNOSIS — Z9049 Acquired absence of other specified parts of digestive tract: Secondary | ICD-10-CM | POA: Diagnosis not present

## 2018-07-01 DIAGNOSIS — J449 Chronic obstructive pulmonary disease, unspecified: Secondary | ICD-10-CM | POA: Diagnosis not present

## 2018-07-01 DIAGNOSIS — Z7401 Bed confinement status: Secondary | ICD-10-CM | POA: Diagnosis not present

## 2018-07-01 DIAGNOSIS — Z9981 Dependence on supplemental oxygen: Secondary | ICD-10-CM | POA: Diagnosis not present

## 2018-07-01 DIAGNOSIS — M6281 Muscle weakness (generalized): Secondary | ICD-10-CM | POA: Diagnosis not present

## 2018-07-01 DIAGNOSIS — E440 Moderate protein-calorie malnutrition: Secondary | ICD-10-CM | POA: Diagnosis not present

## 2018-07-01 DIAGNOSIS — J432 Centrilobular emphysema: Secondary | ICD-10-CM | POA: Diagnosis not present

## 2018-07-01 LAB — CEA: CEA: 2.1 ng/mL (ref 0.0–4.7)

## 2018-07-01 MED ORDER — ENSURE ENLIVE PO LIQD: 237.0000 mL | Freq: Two times a day (BID) | ORAL | 12 refills | Status: DC

## 2018-07-01 MED ORDER — METOPROLOL TARTRATE 50 MG PO TABS: 50.0000 mg | ORAL_TABLET | Freq: Two times a day (BID) | ORAL | Status: DC

## 2018-07-01 MED ORDER — HYDROCODONE-ACETAMINOPHEN 5-325 MG PO TABS: 1.0000 | ORAL_TABLET | ORAL | Status: DC | PRN

## 2018-07-01 MED ORDER — ACETAMINOPHEN 325 MG PO TABS: 650.0000 mg | ORAL_TABLET | Freq: Four times a day (QID) | ORAL | Status: DC | PRN

## 2018-07-01 MED ORDER — OCUVITE-LUTEIN PO CAPS: 1.0000 | ORAL_CAPSULE | Freq: Every day | ORAL | 0 refills | Status: DC

## 2018-07-01 MED ORDER — HYDROCODONE-ACETAMINOPHEN 5-325 MG PO TABS: 1.0000 | ORAL_TABLET | ORAL | 0 refills | Status: DC | PRN

## 2018-07-01 NOTE — Accreditation Note (Signed)
CSW notified by Claiborne Billings at [employer] that they received auth from [facility] this morning. Patient discharging today. Discharge information sent. Nurse to call report. Daughter and son in law aware of discharge.  Shela Leff MSW,LcSW [phone]

## 2018-07-01 NOTE — Progress Notes (Signed)
MD ordered patient to be discharged to SNF.  Discharge instructions were reviewed with the RN, Antony Contras, and she voiced understanding.  Follow-up appointment was made.  Prescription placed in the patients folder.  IV was removed with catheter intact.  All patients questions were answered.

## 2018-07-01 NOTE — Progress Notes (Signed)
07/01/2018  Subjective: Patient is 5 Days Post-Op s/p laparoscopic extended right colectomy for hepatic flexure adenocarcinoma.  Pathology still pending.  No acute events overnight and patient continues to tolerate a diet, having bowel movements.  Reports soreness at the incision but no worsening pain.  Vital signs: Temp:  [97.7 F (36.5 C)-98.1 F (36.7 C)] 98 F (36.7 C) (10/29 0438) Pulse Rate:  [67-88] 86 (10/29 1031) Resp:  [18] 18 (10/29 0438) BP: (136-143)/(61-73) 136/61 (10/29 1031) SpO2:  [96 %-98 %] 97 % (10/29 1031)   Intake/Output: 10/28 0701 - 10/29 0700 In: 443 [P.O.:443] Out: 400 [Urine:400] Last BM Date: 06/30/18  Physical Exam: Constitutional: No acute distress Abdomen:  Soft, non-distended, appropriate tenderness to palpation, particularly of the midline incision.  Three incisions are all clean, dry, intact with staples in place.  No evidence of infection.  Labs:  Recent Labs    06/29/18 0350  WBC 11.0*  HGB 11.9*  HCT 36.9  PLT 250   Recent Labs    06/29/18 0350  NA 132*  K 3.9  CL 97*  CO2 26  GLUCOSE 89  BUN 13  CREATININE 0.96  CALCIUM 8.6*   No results for input(s): LABPROT, INR in the last 72 hours.  Imaging: No results found.  Assessment/Plan: This is a 84 y.o. female s/p laparoscopic extended right colectomy.  --Continue soft diet and supplements --Continue OOB and ambulate.  Working with physical therapy. --Pathology pending. --Pending insurance approval for rehab bed.  Once approved, will discharge.   Melvyn Neth, [facility]

## 2018-07-01 NOTE — Clinical Social Work Placement (Signed)
   CLINICAL SOCIAL WORK PLACEMENT  NOTE  Date:  07/01/2018  Patient Details  Name: [name] MRN: [id number] Date of Birth: [birth year]  Clinical Social Work is seeking post-discharge placement for this patient at the [place] level of care (*CSW will initial, date and re-position this form in  chart as items are completed):  Yes   Patient/family provided with [facility] Work Department's list of facilities offering this level of care within the geographic area requested by the patient (or if unable, by the patient's family).  Yes   Patient/family informed of their freedom to choose among providers that offer the needed level of care, that participate in Medicare, Medicaid or managed care program needed by the patient, have an available bed and are willing to accept the patient.  Yes   Patient/family informed of [facility]'s ownership interest in [facility] and [facility], as well as of the fact that they are under no obligation to receive care at these facilities.  PASRR submitted to EDS on 06/27/18     PASRR number received on 06/27/18     Existing PASRR number confirmed on       FL2 transmitted to all facilities in geographic area requested by pt/family on 06/27/18     FL2 transmitted to all facilities within larger geographic area on       Patient informed that his/her managed care company has contracts with or will negotiate with certain facilities, including the following:        Yes   Patient/family informed of bed offers received.  Patient chooses bed at [facility])     Physician recommends and patient chooses bed at (snf)    Patient to be transferred to [facility]) on 07/01/18.  Patient to be transferred to facility by (EMS)     Patient family notified on 07/01/18 of transfer.  Name of family member notified:  (son in law)     PHYSICIAN       Additional Comment:     _______________________________________________ Shela Leff, LCSW 07/01/2018, 2:23 PM

## 2018-07-01 NOTE — Discharge Summary (Signed)
Patient ID: [name] MRN: [id number] DOB/AGE: 03/04/1928 84 y.o.  Admit date: 06/22/2018 Discharge date: 07/01/2018   Discharge Diagnoses:  Active Problems:   Melena   Malignant neoplasm of hepatic flexure (HCC)   Malnutrition of moderate degree   Procedures:  Laparoscopic extended right colectomy  Hospital Course:  Patient was admitted on 06/22/18 with GI bleed and had a colonoscopy which revealed a hepatic flexure mass, biopsy proven to be adenocarcinoma.  Her staging workup was completed with CT of the chest, abdomen, and pelvis, which were negative for any gross metastatic disease.  She was taken to the OR on 10/24 for a laparoscopic extended right colectomy.  She tolerated the procedure well.  Post-operatively, she recovered well, and her diet was slowly advanced.  Her pain was well controlled, she regained bowel function, was voiding without complications, and was evaluated by physical therapy team.  They recommended discharge to rehab.  Consults: Gastroenterology, Oncology, Medicine, Physical Therapy, Case Management/Social Work  Disposition: Discharge disposition: 01-Home or Self Care       Discharge Instructions    Call MD for:  difficulty breathing, headache or visual disturbances   Complete by:  As directed    Call MD for:  persistant nausea and vomiting   Complete by:  As directed    Call MD for:  redness, tenderness, or signs of infection (pain, swelling, redness, odor or green/yellow discharge around incision site)   Complete by:  As directed    Call MD for:  severe uncontrolled pain   Complete by:  As directed    Call MD for:  temperature >100.4   Complete by:  As directed    Diet - low sodium heart healthy   Complete by:  As directed    Discharge instructions   Complete by:  As directed    1.  Patient may shower, but do not scrub wounds heavily and dab dry only. 2.  Do not submerge wounds in pool/tub for 1 week. 3.  Do not remove staples 4.  May  take MiraLax for constipation.   Driving Restrictions   Complete by:  As directed    Do not drive while taking narcotics for pain control.   Increase activity slowly   Complete by:  As directed    Lifting restrictions   Complete by:  As directed    No heavy lifting or pushing of more than 10-15 lbs for 4 weeks.   No dressing needed   Complete by:  As directed      Allergies as of 07/01/2018   No Known Allergies     Medication List    TAKE these medications   acetaminophen 325 MG tablet Commonly known as:  TYLENOL Take 2 tablets (650 mg total) by mouth every 6 (six) hours as needed for mild pain or fever.   amLODipine 2.5 MG tablet Commonly known as:  NORVASC Take 1 tablet (2.5 mg total) by mouth daily.   aspirin EC 81 MG tablet Take 1 tablet (81 mg total) by mouth daily.   feeding supplement (ENSURE ENLIVE) Liqd Take 237 mLs by mouth 2 (two) times daily between meals.   ferrous sulfate 325 (65 FE) MG tablet Take 1 tablet (325 mg total) by mouth 3 (three) times daily with meals.   Fluticasone-Salmeterol 250-50 MCG/DOSE Aepb Commonly known as:  ADVAIR Inhale 1 puff into the lungs daily. Rinse mouth after use.   HYDROcodone-acetaminophen 5-325 MG tablet Commonly known as:  NORCO/VICODIN Take 1 tablet by  mouth every 4 (four) hours as needed for moderate pain.   levothyroxine 100 MCG tablet Commonly known as:  SYNTHROID, LEVOTHROID Take 1 tablet (100 mcg total) by mouth daily before breakfast.   metoprolol tartrate 50 MG tablet Commonly known as:  LOPRESSOR Take 1 tablet (50 mg total) by mouth 2 (two) times daily. What changed:  Another medication with the same name was removed. Continue taking this medication, and follow the directions you see here.   multivitamin-lutein Caps capsule Take 1 capsule by mouth daily. Start taking on:  07/02/2018   OXYGEN Inhale 2 L into the lungs. As needed during the day, and full use at bedtime       Contact information for  follow-up providers    Olean Ree, MD Follow up in 1 week(s).   Specialty:  General Surgery Why:  Follow up in 1 week for staple removal. Contact information: [address] [place] [address] [phone]        Cammie Sickle, MD Follow up.   Specialties:  Internal Medicine, Oncology Why:  Patient has appointment on 11/7 at 11:30 am with pre-visit labs at 11 am. Contact information: [place] [address] [phone]            Contact information for after-discharge care    [facility] .   Service:  Skilled Chiropodist information: [address] [place] [phone]

## 2018-07-02 ENCOUNTER — Ambulatory Visit: Payer: Medicare (Managed Care) | Admitting: Internal Medicine

## 2018-07-03 ENCOUNTER — Other Ambulatory Visit: Payer: Self-pay | Admitting: Family Medicine

## 2018-07-04 ENCOUNTER — Ambulatory Visit (INDEPENDENT_AMBULATORY_CARE_PROVIDER_SITE_OTHER): Payer: Medicare Other | Admitting: Surgery

## 2018-07-04 ENCOUNTER — Other Ambulatory Visit: Payer: Self-pay

## 2018-07-04 ENCOUNTER — Encounter: Payer: Self-pay | Admitting: Surgery

## 2018-07-04 VITALS — BP 118/64 | HR 88 | Temp 97.3°F | Resp 16 | Ht 60.0 in | Wt 90.0 lb

## 2018-07-04 DIAGNOSIS — C183 Malignant neoplasm of hepatic flexure: Secondary | ICD-10-CM

## 2018-07-04 DIAGNOSIS — Z09 Encounter for follow-up examination after completed treatment for conditions other than malignant neoplasm: Secondary | ICD-10-CM

## 2018-07-04 NOTE — Patient Instructions (Signed)
Staples removed today and steri strips were placed.  These will begin to fall off 7-10 days. You may shower as usual however do not scrub on the steri-strips. Be sure to pat dry. You do not need to place a dressing over the steri strips.   Please see your follow up appointment listed below.

## 2018-07-06 ENCOUNTER — Encounter: Payer: Self-pay | Admitting: Surgery

## 2018-07-06 NOTE — Progress Notes (Signed)
07/06/2018  HPI: [name] is a 84 y.o. female s/p laparoscopic extended right colectomy on 10/24 for adenocarcinoma of the hepatic flexure.  She presents today for first follow up.  She was discharged to rehab on 10/29.  She is trying to eat more but still feels fatigued and with soreness over her incisions.  She's having bowel movements which are a bit loose at this point.  Vital signs: BP 118/64   Pulse 88   Temp (!) 97.3 F (36.3 C) (Temporal)   Resp 16   Ht 5' (1.524 m)   Wt 90 lb (40.8 kg)   SpO2 97%   BMI 17.58 kg/m    Physical Exam: Constitutional: No acute distress Abdomen:  Soft, nondistended, appropriately sore to palpation over the incisions.  Incisions are clean, dry, intact with staples in place.  No evidence of infection.  Staples removed and changed to steri strips.  Assessment/Plan: This is a 84 y.o. female s/p laparoscopic extended right colectomy.  --Pathology reviewed with the patient.  She is T3N0M0, placing her at Stage IIA.  She has an appointment with Dr. Rogue Bussing on 11/7 to discuss any further management. --Staples removed without complications and steri strips applied. --Continue working with PT/OT for strengthening. --Follow up with me in two weeks to check on her progress and healing.   Melvyn Neth, [facility]

## 2018-07-08 ENCOUNTER — Other Ambulatory Visit: Payer: Medicare Other

## 2018-07-10 ENCOUNTER — Inpatient Hospital Stay (HOSPITAL_BASED_OUTPATIENT_CLINIC_OR_DEPARTMENT_OTHER): Payer: Medicare Other | Admitting: Internal Medicine

## 2018-07-10 ENCOUNTER — Inpatient Hospital Stay: Payer: Medicare Other | Attending: Internal Medicine

## 2018-07-10 ENCOUNTER — Other Ambulatory Visit: Payer: Medicare Other

## 2018-07-10 VITALS — BP 101/62 | HR 87 | Resp 16 | Wt 88.0 lb

## 2018-07-10 DIAGNOSIS — Z9049 Acquired absence of other specified parts of digestive tract: Secondary | ICD-10-CM | POA: Insufficient documentation

## 2018-07-10 DIAGNOSIS — D649 Anemia, unspecified: Secondary | ICD-10-CM

## 2018-07-10 DIAGNOSIS — C183 Malignant neoplasm of hepatic flexure: Secondary | ICD-10-CM | POA: Diagnosis not present

## 2018-07-10 LAB — CBC WITH DIFFERENTIAL/PLATELET
Abs Immature Granulocytes: 0.09 10*3/uL — ABNORMAL HIGH (ref 0.00–0.07)
Basophils Absolute: 0.1 10*3/uL (ref 0.0–0.1)
Basophils Relative: 1 %
Eosinophils Absolute: 0.1 10*3/uL (ref 0.0–0.5)
Eosinophils Relative: 0 %
HCT: 31.6 % — ABNORMAL LOW (ref 36.0–46.0)
Hemoglobin: 10.1 g/dL — ABNORMAL LOW (ref 12.0–15.0)
Immature Granulocytes: 1 %
Lymphocytes Relative: 11 %
Lymphs Abs: 1.8 10*3/uL (ref 0.7–4.0)
MCH: 27.9 pg (ref 26.0–34.0)
MCHC: 32 g/dL (ref 30.0–36.0)
MCV: 87.3 fL (ref 80.0–100.0)
Monocytes Absolute: 1.4 10*3/uL — ABNORMAL HIGH (ref 0.1–1.0)
Monocytes Relative: 9 %
Neutro Abs: 12.4 10*3/uL — ABNORMAL HIGH (ref 1.7–7.7)
Neutrophils Relative %: 78 %
Platelets: 413 10*3/uL — ABNORMAL HIGH (ref 150–400)
RBC: 3.62 MIL/uL — ABNORMAL LOW (ref 3.87–5.11)
RDW: 14.8 % (ref 11.5–15.5)
WBC: 15.8 10*3/uL — ABNORMAL HIGH (ref 4.0–10.5)
nRBC: 0 % (ref 0.0–0.2)

## 2018-07-10 LAB — BASIC METABOLIC PANEL
Anion gap: 7 (ref 5–15)
BUN: 11 mg/dL (ref 8–23)
CO2: 26 mmol/L (ref 22–32)
Calcium: 8.5 mg/dL — ABNORMAL LOW (ref 8.9–10.3)
Chloride: 100 mmol/L (ref 98–111)
Creatinine, Ser: 1.01 mg/dL — ABNORMAL HIGH (ref 0.44–1.00)
GFR calc Af Amer: 56 mL/min — ABNORMAL LOW (ref >60)
GFR calc non Af Amer: 48 mL/min — ABNORMAL LOW (ref >60)
Glucose, Bld: 124 mg/dL — ABNORMAL HIGH (ref 70–99)
Potassium: 3.9 mmol/L (ref 3.5–5.1)
Sodium: 133 mmol/L — ABNORMAL LOW (ref 135–145)

## 2018-07-10 NOTE — Progress Notes (Addendum)
Tumor Board Documentation  Briannie Gutierrez was presented by Dr Rogue Bussing at our Tumor Board on 07/10/2018, which included representatives from medical oncology, radiation oncology, radiology, surgical, pathology, navigation, internal medicine, research, pulmonology, pharmacy.  Laiyah currently presents as a new patient with history of the following treatments: surgical intervention(s).  Additionally, we reviewed previous medical and familial history, history of present illness, and recent lab results along with all available histopathologic and imaging studies. The tumor board considered available treatment options and made the following recommendations:      The following procedures/referrals were also placed: No orders of the defined types were placed in this encounter.   Clinical Trial Status: unknown   Staging used: AJCC Stage Group  National site-specific guidelines NCCN were discussed with respect to the case.  Tumor board is a meeting of clinicians from various specialty areas who evaluate and discuss patients for whom a multidisciplinary approach is being considered. Final determinations in the plan of care are those of the provider(s). The responsibility for follow up of recommendations given during tumor board is that of the provider.   Today's extended care, comprehensive team conference, [place] was not present for the discussion and was not examined.   Multidisciplinary Tumor Board is a multidisciplinary case peer review process.  Decisions discussed in the Multidisciplinary Tumor Board reflect the opinions of the specialists present at the conference without having examined the patient.  Ultimately, treatment and diagnostic decisions rest with the primary provider(s) and the patient.  #Stage II adenocarcinoma; MSI high-good prognosis; no adjuvant therapy.  BRAF mutation testing pending

## 2018-07-10 NOTE — Progress Notes (Signed)
[place] CONSULT NOTE  Patient Care Team: Valerie Roys, DO as PCP - General (Family Medicine) Laverle Hobby, MD as Consulting Physician (Pulmonary Disease) Minna Merritts, MD as Consulting Physician (Cardiology) Clent Jacks, RN as Registered Nurse  CHIEF COMPLAINTS/PURPOSE OF CONSULTATION:  Colon cancer  #  Oncology History   # October 2019-adenocarcinoma hepatic flexure status post resection [Dr.Piscoya];  PT3pN0; [place] [Good Prognosis]  # A.fib     Malignant neoplasm of hepatic flexure (HCC)    Initial Diagnosis    Malignant neoplasm of hepatic flexure ([place])     HISTORY OF PRESENTING ILLNESS:  [name] 84 y.o.  female with no significant past medical history except for A. Fib with who has been living by herself at home is currently admitted the hospital for GI bleed/anemia.  Further work-up including a CT scan showed-right colon mass/hepatic flexure.  Patient had a colonoscopy that confirmed colon cancer high-grade adenocarcinoma.  Patient subsequently underwent surgery right hemicolectomy; patient was subsequently discharged to rehab.  She is here accompanied by daughter to review the pathology next plan of care.  Patient complains of abdominal pain at the site of surgery.  She has been to hydrocodone as needed.  Otherwise denies any fevers or chills.  Patient had a staples taken off earlier in the week.  Review of Systems  Constitutional: Negative for chills, diaphoresis, fever, malaise/fatigue and weight loss.  HENT: Negative for nosebleeds and sore throat.   Eyes: Negative for double vision.  Respiratory: Negative for cough, hemoptysis, sputum production, shortness of breath and wheezing.   Cardiovascular: Negative for chest pain, palpitations, orthopnea and leg swelling.  Gastrointestinal: Positive for abdominal pain (Incisional pain.). Negative for blood in stool, constipation, diarrhea, heartburn, melena, nausea and  vomiting.  Genitourinary: Negative for dysuria, frequency and urgency.  Musculoskeletal: Negative for back pain and joint pain.  Skin: Negative.  Negative for itching and rash.  Neurological: Negative for dizziness, tingling, focal weakness, weakness and headaches.  Endo/Heme/Allergies: Does not bruise/bleed easily.  Psychiatric/Behavioral: Negative for depression. The patient is not nervous/anxious and does not have insomnia.      MEDICAL HISTORY:  Past Medical History:  Diagnosis Date  . Anxiety   . Atrial fibrillation ([place])   . Clotting disorder (HCC)    left ankle  . COPD (chronic obstructive pulmonary disease) ([place])   . Depression   . Overactive bladder   . Thyroid disease     SURGICAL HISTORY: Past Surgical History:  Procedure Laterality Date  . ABDOMINAL HYSTERECTOMY    . BREAST LUMPECTOMY    . COLONOSCOPY Left 06/23/2018   Procedure: COLONOSCOPY;  Surgeon: Jonathon Bellows, MD;  Location[facility] END[facility]COPY;  Service: Gastroenterology;  Laterality: Left;  Colonoscopy first. Dr. Vicente Males will decide if EGD is needed after colonoscopy  . ESOPHAGOGASTRODUODENOSCOPY Left 06/23/2018   Procedure: ESOPHAGOGASTRODUODENOSCOPY (EGD);  Surgeon: Jonathon Bellows, MD;  Location: [facility] ENDOSCOPY;  Service: Gastroenterology;  Laterality: Left;  Colonoscopy first. Dr. Vicente Males will decide if EGD is needed after colonoscopy  . LAPAROSCOPIC RIGHT COLECTOMY Right 06/26/2018   Procedure: LAPAROSCOPIC RIGHT COLECTOMY;  Surgeon: Olean Ree, MD;  Location: [facility] ORS;  Service: General;  Laterality: Right;    SOCIAL HISTORY: Social History   Socioeconomic History  . Marital status: Divorced    Spouse name: Not on file  . Number of children: Not on file  . Years of education: Not on file  . Highest education level: High school graduate  Occupational History  . Not  on file  Social Needs  . Financial resource strain: Somewhat hard  . Food insecurity:    Worry: Never true    Inability: Never true  .  Transportation needs:    Medical: No    Non-medical: No  Tobacco Use  . Smoking status: Former Smoker    Years: 25.00    Types: Cigarettes    Last attempt to quit: 02/01/1986    Years since quitting: 32.4  . Smokeless tobacco: Never Used  . Tobacco comment: quit in 1987   Substance and Sexual Activity  . Alcohol use: Never    Frequency: Never  . Drug use: Never  . Sexual activity: Not Currently  Lifestyle  . Physical activity:    Days per week: 0 days    Minutes per session: 0 min  . Stress: Not at all  Relationships  . Social connections:    Talks on phone: More than three times a week    Gets together: More than three times a week    Attends religious service: Never    Active member of club or organization: No    Attends meetings of clubs or organizations: Never    Relationship status: Divorced  . Intimate partner violence:    Fear of current or ex partner: No    Emotionally abused: No    Physically abused: No    Forced sexual activity: No  Other Topics Concern  . Not on file  Social History Narrative   Has 3 daughters, 2 local     FAMILY HISTORY: Family History  Problem Relation Age of Onset  . Heart disease Mother   . Heart failure Mother   . Parkinson's disease Father   . Heart disease Sister   . Heart disease Son     ALLERGIES:  has No Known Allergies.  MEDICATIONS:  Current Outpatient Medications  Medication Sig Dispense Refill  . acetaminophen (TYLENOL) 325 MG tablet Take 2 tablets (650 mg total) by mouth every 6 (six) hours as needed for mild pain or fever.    Marland Kitchen amLODipine (NORVASC) 2.5 MG tablet Take 1 tablet (2.5 mg total) by mouth daily. 90 tablet 1  . aspirin EC 81 MG tablet Take 1 tablet (81 mg total) by mouth daily. 100 tablet 4  . feeding supplement, ENSURE ENLIVE, (ENSURE ENLIVE) LIQD Take 237 mLs by mouth 2 (two) times daily between meals. 237 mL 12  . ferrous sulfate 325 (65 FE) MG tablet Take 1 tablet (325 mg total) by mouth 3 (three) times  daily with meals. 90 tablet 3  . Fluticasone-Salmeterol (ADVAIR DISKUS) 250-50 MCG/DOSE AEPB Inhale 1 puff into the lungs daily. Rinse mouth after use. 1 each 2  . HYDROcodone-acetaminophen (NORCO/VICODIN) 5-325 MG tablet Take 1 tablet by mouth every 4 (four) hours as needed for moderate pain. 30 tablet 0  . levothyroxine (SYNTHROID, LEVOTHROID) 100 MCG tablet Take 1 tablet (100 mcg total) by mouth daily before breakfast. 90 tablet 3  . metoprolol tartrate (LOPRESSOR) 50 MG tablet Take 1 tablet (50 mg total) by mouth 2 (two) times daily.    . multivitamin-lutein (OCUVITE-LUTEIN) CAPS capsule Take 1 capsule by mouth daily.  0  . OXYGEN Inhale 2 L into the lungs. As needed during the day, and full use at bedtime     No current facility-administered medications for this visit.       Marland Kitchen  PHYSICAL EXAMINATION: ECOG PERFORMANCE STATUS: 1 - Symptomatic but completely ambulatory  Vitals:   07/10/18 1201  BP: 101/62  Pulse: 87  Resp: 16   Filed Weights   07/10/18 1201  Weight: 88 lb (39.9 kg)    Physical Exam  Constitutional: She is oriented to person, place, and time and well-developed, well-nourished, and in no distress.  She is accompanied by her daughter.  She is in a wheelchair.  HENT:  Head: Normocephalic and atraumatic.  Mouth/Throat: Oropharynx is clear and moist. No oropharyngeal exudate.  Eyes: Pupils are equal, round, and reactive to light.  Neck: Normal range of motion. Neck supple.  Cardiovascular: Normal rate and regular rhythm.  Pulmonary/Chest: No respiratory distress. She has no wheezes.  Abdominal: Soft. Bowel sounds are normal. She exhibits no distension and no mass. There is no tenderness. There is no rebound and no guarding.  Musculoskeletal: Normal range of motion. She exhibits no edema or tenderness.  Neurological: She is alert and oriented to person, place, and time.  Skin: Skin is warm.  Psychiatric: Affect normal.     LABORATORY DATA:  I have reviewed  the data as listed Lab Results  Component Value Date   WBC 15.8 (H) 07/10/2018   HGB 10.1 (L) 07/10/2018   HCT 31.6 (L) 07/10/2018   MCV 87.3 07/10/2018   PLT 413 (H) 07/10/2018   Recent Labs    05/19/18 1433  06/22/18 0424 06/25/18 0345  06/28/18 0758 06/29/18 0350 07/10/18 1113  NA 137   < > 139 139   < > 133* 132* 133*  K 4.4   < > 4.1 3.7   < > 4.4 3.9 3.9  CL 99   < > 105 108   < > 97* 97* 100  CO2 24   < > 27 25   < > 27 26 26   GLUCOSE 91   < > 111* 91   < > 100* 89 124*  BUN 17   < > 18 10   < > 12 13 11   CREATININE 1.09*   < > 1.29* 1.00   < > 0.97 0.96 1.01*  CALCIUM 9.0   < > 8.8* 8.3*   < > 8.4* 8.6* 8.5*  GFRNONAA 46*   < > 36* 49*   < > 51* 51* 48*  GFRAA 53*   < > 42* 57*   < > 59* 59* 56*  PROT 6.4  --  6.5 5.5*  --   --   --   --   ALBUMIN 4.1  --  3.7 3.1*  --   --   --   --   AST 22  --  21 17  --   --   --   --   ALT 10  --  10 8  --   --   --   --   ALKPHOS 119*  --  94 72  --   --   --   --   BILITOT 0.3  --  0.4 0.6  --   --   --   --    < > = values in this interval not displayed.    RADIOGRAPHIC STUDIES: I have personally reviewed the radiological images as listed and agreed with the findings in the report. Ct Chest Wo Contrast  Result Date: 06/23/2018 CLINICAL DATA:  Past medical history of atrial fibrillation, COPD and intestinal perforation. Presents to the emergency department with dark tarry stools. CT of abdomen revealed a mass at the appendix flexure. Staging workup. EXAM: CT CHEST WITHOUT CONTRAST TECHNIQUE: Multidetector CT  imaging of the chest was performed following the standard protocol without IV contrast. COMPARISON:  None. FINDINGS: Cardiovascular: Aortic atherosclerosis. No thoracic aortic aneurysm. Heart size is within normal limits. No pericardial effusion. Three-vessel coronary artery calcifications. Mediastinum/Nodes: No mass or enlarged lymph nodes seen within the mediastinum or perihilar regions. Esophagus is unremarkable. Trachea  is unremarkable. Lungs/Pleura: No pulmonary nodule or mass. Diffuse emphysematous change, moderate to severe in degree, upper lobe predominant. Associated mild scarring/fibrosis at the lung apices. No pneumonia, pleural effusion or pulmonary edema. Upper Abdomen: Limited images of the upper abdomen are unremarkable. Musculoskeletal: Mild degenerative spondylosis throughout the scoliotic thoracolumbar spine. No acute or suspicious osseous finding. IMPRESSION: 1. No evidence of metastatic disease within the chest. 2. No acute findings within the chest. 3. Emphysema, moderate to severe in degree. 4. Aortic atherosclerosis. Particularly advanced atherosclerosis of the lower descending thoracic aorta and upper abdominal aorta. 5. Three-vessel coronary artery calcifications. Heart size is normal. Aortic Atherosclerosis (ICD10-I70.0) and Emphysema (ICD10-J43.9). Electronically Signed   By: Franki Cabot M.D.   On: 06/23/2018 15:12   Ct Angio Abd/pel W And/or Wo Contrast  Result Date: 06/22/2018 CLINICAL DATA:  GI bleed. EXAM: CTA ABDOMEN AND PELVIS wITHOUT AND WITH CONTRAST TECHNIQUE: Multidetector CT imaging of the abdomen and pelvis was performed using the standard protocol during bolus administration of intravenous contrast. Multiplanar reconstructed images and MIPs were obtained and reviewed to evaluate the vascular anatomy. CONTRAST:  161m ISOVUE-370 IOPAMIDOL (ISOVUE-370) INJECTION 76% COMPARISON:  None. FINDINGS: VASCULAR Aorta: Diffuse dense irregular calcified and noncalcified atheromatous plaque, coarse plaque causes approximately 50% luminal narrowing of the upper abdominal aorta just proximal to the SMA. No aneurysm. No evidence of vasculitis. Celiac: Severe stenosis at the origin due to calcified and noncalcified plaque. Early branching of right and left hepatic arteries versus aortic origin of the right hepatic artery. SMA: Patent without evidence of aneurysm, dissection, vasculitis or significant  stenosis. Renals: Plaque at the origin of the right renal artery causes severe stenosis with near complete occlusion, tiny vessel into an atrophic right kidney. Moderate plaque at the origin of the left renal artery causes approximately 50% stenosis. IMA: Patent without evidence of aneurysm, dissection, vasculitis or significant stenosis. Inflow: Moderate plaque that evidence of aneurysm, dissection, vasculitis or significant stenosis. Proximal Outflow: Bilateral common femoral and visualized portions of the superficial and profunda femoral arteries are patent without evidence of aneurysm, dissection, vasculitis or significant stenosis. Veins: Portal and mesenteric veins appear patent and venous phase imaging. Iliac veins and IVC are unremarkable. Review of the MIP images confirms the above findings. NON-VASCULAR Lower chest: Emphysema without consolidation or pleural effusion. Scattered atelectasis. There are coronary artery calcifications. Small hiatal hernia. Hepatobiliary: No focal liver abnormality is seen. No gallstones, gallbladder wall thickening, or biliary dilatation. Pancreas: Atrophic parenchyma. No ductal dilatation or inflammation. Spleen: Normal in size without focal abnormality. Adrenals/Urinary Tract: No adrenal nodule. Marked right renal atrophy, right kidney is poorly perfused. 11 mm cyst in the mid right kidney. No left hydronephrosis or perinephric edema. Urinary bladder is partially distended. No bladder wall thickening. Stomach/Bowel: Small hiatal hernia. Stomach is nondistended. There is no contrast extravasation into the GI tract to localize site of GI bleed. Wall thickening of the cecum and ascending colon, with masslike thickening at the hepatic flexure, for example image 87 series 7. This area of masslike thickening spans at least 5 cm. No pericolonic stranding. No small bowel dilatation or obstruction. Moderate stool in the distal colon. Distal colonic diverticulosis  without  diverticulitis. Appendix not definitively visualized. Lymphatic: No enlarged abdominal or pelvic lymph nodes. Reproductive: Status post hysterectomy. No adnexal masses. Other: No free air or ascites. Musculoskeletal: Scoliotic curvature and multilevel degenerative change in the spine. There are no acute or suspicious osseous abnormalities. IMPRESSION: VASCULAR 1. No active extravasation into the GI tract to localize site of GI bleed. 2. Diffuse calcified and noncalcified atheromatous plaque throughout the abdominal aorta, plaque causes approximately 50% narrowing of the upper abdominal aorta at the level of the SMA. 3. 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 Hepatic or right hepatic artery likely rises directly from the aorta, normal variant anatomy. 4. High-grade stenosis at the origin of the right renal artery with associated right renal atrophy and poor perfusion of the right kidney. NON-VASCULAR 1. Colonic wall thickening of the cecum and ascending colon, with masslike thickening at the hepatic flexure. Hepatic flexure findings are suspicious for colonic malignancy, with possible ascending colitis. Recommend direct visualization with colonoscopy. 2. Distal colonic diverticulosis without diverticulitis. Aortic Atherosclerosis (ICD10-I70.0) and Emphysema (ICD10-J43.9). Electronically Signed   By: Keith Rake M.D.   On: 06/22/2018 06:04    ASSESSMENT & PLAN:   Malignant neoplasm of hepatic flexure ([place]) # Colon cancer stage II; MSI high-patient should have an excellent prognosis from a colon cancer standpoint with a cure rate above 90% with surgery alone.  Patient would not benefit from any adjuvant chemotherapy.  # Abdominal pain from surgery-improving; recommend continued hydrocodone/Tylenol as needed.  #Anemia mild hemoglobin 10.  Recommend continued p.o. iron.  #MSI high status-I suspect sporadic mutation; rather than Lynch  syndrome [no personal or family history of cancer suggestive of Lynch syndrome.].  B-RAF testing pending at this time.  #Surveillance is regarding to the colon cancer-colonoscopy within the year of surgery/however given her age deferred at the discretion of the gastroenterologist. I would not recommend any imaging for surveillance.  CEA could be checked every 6 monthly basis.  Patient's pathology imaging reviewed the tumor conference.  # If any concerns patient could follow-up with [place] the cancer center.  For now I defer continued follow-up with PCP.  # I reviewed the blood work- with the patient in detail; also reviewed the imaging independently [as summarized above]; and with the patient in detail.   # 40 minutes face-to-face with the patient discussing the above plan of care; more than 50% of time spent on prognosis/ natural history; counseling and coordination.   Cc: Dr.Johnson, Jinny Blossom    All questions were answered. The patient knows to call the clinic with any problems, questions or concerns.       Cammie Sickle, MD 07/10/2018 6:09 PM

## 2018-07-10 NOTE — Progress Notes (Signed)
Met with [name] and her daughter [name]. Introduced Therapist, nutritional and provided contact information for any future needs. No barriers identified at this time.

## 2018-07-10 NOTE — Assessment & Plan Note (Addendum)
#  Colon cancer stage II; MSI high-patient should have an excellent prognosis from a colon cancer standpoint with a cure rate above 90% with surgery alone.  Patient would not benefit from any adjuvant chemotherapy.  # Abdominal pain from surgery-improving; recommend continued hydrocodone/Tylenol as needed.  #Anemia mild hemoglobin 10.  Recommend continued p.o. iron.  #MSI high status-I suspect sporadic mutation; rather than Lynch syndrome [no personal or family history of cancer suggestive of Lynch syndrome.].  B-RAF testing pending at this time.  #Surveillance is regarding to the colon cancer-colonoscopy within the year of surgery/however given her age deferred at the discretion of the gastroenterologist. I would not recommend any imaging for surveillance.  CEA could be checked every 6 monthly basis.  Patient's pathology imaging reviewed the tumor conference.  # If any concerns patient could follow-up with [place] the cancer center.  For now I defer continued follow-up with PCP.  # I reviewed the blood work- with the patient in detail; also reviewed the imaging independently [as summarized above]; and with the patient in detail.   # 40 minutes face-to-face with the patient discussing the above plan of care; more than 50% of time spent on prognosis/ natural history; counseling and coordination.   Cc: Dr.Johnson, [place]

## 2018-07-16 ENCOUNTER — Encounter: Payer: Self-pay | Admitting: Internal Medicine

## 2018-07-16 LAB — SURGICAL PATHOLOGY

## 2018-07-18 ENCOUNTER — Encounter: Payer: Self-pay | Admitting: Surgery

## 2018-07-18 ENCOUNTER — Other Ambulatory Visit: Payer: Self-pay

## 2018-07-18 ENCOUNTER — Ambulatory Visit (INDEPENDENT_AMBULATORY_CARE_PROVIDER_SITE_OTHER): Payer: Medicare Other | Admitting: Surgery

## 2018-07-18 VITALS — BP 120/70 | HR 78 | Temp 97.9°F | Resp 13 | Ht 60.0 in | Wt 88.0 lb

## 2018-07-18 DIAGNOSIS — C183 Malignant neoplasm of hepatic flexure: Secondary | ICD-10-CM

## 2018-07-18 DIAGNOSIS — Z09 Encounter for follow-up examination after completed treatment for conditions other than malignant neoplasm: Secondary | ICD-10-CM

## 2018-07-18 NOTE — Progress Notes (Signed)
07/18/2018  HPI: [name] is a 84 y.o. female s/p laparoscopic extended right colectomy for hepatic flexure cancer.  She presents for 2nd post-op follow up.  On her last visit, staples were removed.  She was still having discomfort and mobility issues.  Today, she reports that she will be leaving rehab today to go home.  She's feeling stronger and feeling much better.  She does endorse constipation.  Vital signs: BP 120/70   Pulse 78   Temp 97.9 F (36.6 C) (Skin)   Resp 13   Ht 5' (1.524 m)   Wt 88 lb (39.9 kg)   SpO2 98%   BMI 17.19 kg/m    Physical Exam: Constitutional: No acute distress Abdomen:  Soft, nondistended, nontender to palpation.  Incisions healing well without any evidence of complications.    Assessment/Plan: This is a 84 y.o. female s/p laparoscopic extended right colectomy.  --Patient going home today from rehab.  Healing well and doing much better. --Recommended that she start Metamucil daily and Miralax as needed for her constipation. --she will follow up in 6 months for exam and CEA lab check.  She will need a colonoscopy in a year.   Melvyn [name], [facility]

## 2018-07-18 NOTE — Patient Instructions (Addendum)
Return in six months. CEA prior to that visit. Ref to [facility] in one year colonoscopy .

## 2018-07-22 ENCOUNTER — Telehealth: Payer: Self-pay | Admitting: Family Medicine

## 2018-07-22 NOTE — Telephone Encounter (Signed)
Copied from [place] [id number]. Topic: Quick Communication - Home Health Verbal Orders >> Jul 22, 2018  4:34 PM Berneta Levins wrote: Caller/Agency: Jenny Reichmann from [facility] Number: [phone], [place] to leave a message Requesting OT/PT/Skilled Nursing/Social Work: Pt was just discharged from [facility] and has home health orders for Nursing, PT, OT, and home health aid.  Wants to make sure that Dr. Wynetta Emery will be signing physician on plan of care.

## 2018-07-23 NOTE — Telephone Encounter (Signed)
Message relayed to [place] at [facility].

## 2018-07-23 NOTE — Telephone Encounter (Signed)
Darlina Guys from [facility] calling to make provider aware they will be going to see pt on 07/25/18

## 2018-07-23 NOTE — Telephone Encounter (Signed)
OK to give verbal orders 

## 2018-07-23 NOTE — Telephone Encounter (Signed)
I will be the signing physician

## 2018-07-24 ENCOUNTER — Ambulatory Visit (INDEPENDENT_AMBULATORY_CARE_PROVIDER_SITE_OTHER): Payer: Medicare Other | Admitting: Family Medicine

## 2018-07-24 ENCOUNTER — Encounter: Payer: Self-pay | Admitting: Family Medicine

## 2018-07-24 ENCOUNTER — Other Ambulatory Visit: Payer: Self-pay

## 2018-07-24 VITALS — BP 103/65 | HR 86 | Temp 97.5°F | Ht 60.0 in | Wt 88.2 lb

## 2018-07-24 DIAGNOSIS — R1031 Right lower quadrant pain: Secondary | ICD-10-CM

## 2018-07-24 DIAGNOSIS — E039 Hypothyroidism, unspecified: Secondary | ICD-10-CM | POA: Diagnosis not present

## 2018-07-24 DIAGNOSIS — D500 Iron deficiency anemia secondary to blood loss (chronic): Secondary | ICD-10-CM

## 2018-07-24 DIAGNOSIS — D509 Iron deficiency anemia, unspecified: Secondary | ICD-10-CM | POA: Insufficient documentation

## 2018-07-24 DIAGNOSIS — I10 Essential (primary) hypertension: Secondary | ICD-10-CM | POA: Diagnosis not present

## 2018-07-24 DIAGNOSIS — Z85038 Personal history of other malignant neoplasm of large intestine: Secondary | ICD-10-CM

## 2018-07-24 MED ORDER — METOPROLOL TARTRATE 50 MG PO TABS: 50.0000 mg | ORAL_TABLET | Freq: Two times a day (BID) | ORAL | 1 refills | Status: DC

## 2018-07-24 MED ORDER — AMITRIPTYLINE HCL 25 MG PO TABS: 25.0000 mg | ORAL_TABLET | Freq: Every day | ORAL | 1 refills | Status: DC

## 2018-07-24 MED ORDER — LIDOCAINE 5 % EX PTCH: 1.0000 | MEDICATED_PATCH | Freq: Two times a day (BID) | CUTANEOUS | 12 refills | Status: DC

## 2018-07-24 MED ORDER — AMLODIPINE BESYLATE 2.5 MG PO TABS: 2.5000 mg | ORAL_TABLET | Freq: Every day | ORAL | 1 refills | Status: DC

## 2018-07-24 NOTE — Progress Notes (Signed)
BP 103/65   Pulse 86   Temp (!) 97.5 F (36.4 C) (Oral)   Ht 5' (1.524 m)   Wt 88 lb 3.2 oz (40 kg)   SpO2 95%   BMI 17.23 kg/m    Subjective:    Patient ID: [name], female    DOB: 1928-08-24, 84 y.o.   MRN: [id number]  HPI: [name] is a 84 y.o. female  Chief Complaint  Patient presents with  . Follow-up    colon cancer surgery f/u per patient   [name] presents today following surgery for colon cancer. She was admitted to the hospital a month ago for a upper GI bleed. Had a colonoscopy that showed a hepatic flexure mass that was adenocarcinoma on biopsy. She had staging W/U done with CT of chest, abdomen and pelvis and it was negative for any metastatic disease. She has a laproscopic extended R colectomy on 06/26/18. She had been doing well and was discharged to rehab. She just got out of rehab on Friday.   She notes that she has not been feeling great. She has been having stabbing pains in her belly in her lower quadrants. Has been going on since she had the operation. Pain is more of a lightning type shooting pain. Not dull or aching. Has been feeling weak. She notes that she cannot stay up very long or walk very far. She has PT coming in to continue to work with her at home, they are to start tomorrow.   HYPERTENSION Hypertension status: better  Satisfied with current treatment? yes Duration of hypertension: chronic BP monitoring frequency:  not checking BP range:  BP medication side effects:  no Medication compliance: excellent compliance Previous BP meds: metoprolol, amlodipine Aspirin: yes Recurrent headaches: no Visual changes: no Palpitations: no Dyspnea: no Chest pain: no Lower extremity edema: no Dizzy/lightheaded: no   Relevant past medical, surgical, family and social history reviewed and updated as indicated. Interim medical history since our last visit reviewed. Allergies and medications reviewed and updated.  Review of Systems    Constitutional: Positive for fatigue. Negative for activity change, appetite change, chills, diaphoresis, fever and unexpected weight change.  Respiratory: Negative.   Cardiovascular: Negative.   Gastrointestinal: Positive for abdominal pain and diarrhea. Negative for abdominal distention, anal bleeding, blood in stool, constipation, nausea, rectal pain and vomiting.  Musculoskeletal: Negative.   Skin: Negative.   Neurological: Negative.   Psychiatric/Behavioral: Negative.     Per HPI unless specifically indicated above     Objective:    BP 103/65   Pulse 86   Temp (!) 97.5 F (36.4 C) (Oral)   Ht 5' (1.524 m)   Wt 88 lb 3.2 oz (40 kg)   SpO2 95%   BMI 17.23 kg/m   Wt Readings from Last 3 Encounters:  07/24/18 88 lb 3.2 oz (40 kg)  07/18/18 88 lb (39.9 kg)  07/10/18 88 lb (39.9 kg)    Physical Exam  Constitutional: She is oriented to person, place, and time. She appears well-developed and well-nourished. No distress.  HENT:  Head: Normocephalic and atraumatic.  Right Ear: Hearing normal.  Left Ear: Hearing normal.  Nose: Nose normal.  Eyes: Conjunctivae and lids are normal. Right eye exhibits no discharge. Left eye exhibits no discharge. No scleral icterus.  Cardiovascular: Normal rate, regular rhythm, normal heart sounds and intact distal pulses. Exam reveals no gallop and no friction rub.  No murmur heard. Pulmonary/Chest: Effort normal and breath sounds normal. No stridor. No  respiratory distress. She has no wheezes. She has no rales. She exhibits no tenderness.  Abdominal: Soft. Bowel sounds are normal. She exhibits no distension and no mass. There is tenderness (RLQ). There is no rebound and no guarding. No hernia.  Musculoskeletal: Normal range of motion.  Neurological: She is alert and oriented to person, place, and time.  Skin: Skin is warm, dry and intact. Capillary refill takes less than 2 seconds. No rash noted. She is not diaphoretic. No erythema. No pallor.   Psychiatric: She has a normal mood and affect. Her speech is normal and behavior is normal. Judgment and thought content normal. Cognition and memory are normal.  Nursing note and vitals reviewed.   Results for orders placed or performed in visit on 89/37/34  Basic metabolic panel  Result Value Ref Range   Sodium 133 (L) 135 - 145 mmol/L   Potassium 3.9 3.5 - 5.1 mmol/L   Chloride 100 98 - 111 mmol/L   CO2 26 22 - 32 mmol/L   Glucose, Bld 124 (H) 70 - 99 mg/dL   BUN 11 8 - 23 mg/dL   Creatinine, Ser 1.01 (H) 0.44 - 1.00 mg/dL   Calcium 8.5 (L) 8.9 - 10.3 mg/dL   GFR calc non Af Amer 48 (L) >60 mL/min   GFR calc Af Amer 56 (L) >60 mL/min   Anion gap 7 5 - 15  CBC with Differential/Platelet  Result Value Ref Range   WBC 15.8 (H) 4.0 - 10.5 K/uL   RBC 3.62 (L) 3.87 - 5.11 MIL/uL   Hemoglobin 10.1 (L) 12.0 - 15.0 g/dL   HCT 31.6 (L) 36.0 - 46.0 %   MCV 87.3 80.0 - 100.0 fL   MCH 27.9 26.0 - 34.0 pg   MCHC 32.0 30.0 - 36.0 g/dL   RDW 14.8 11.5 - 15.5 %   Platelets 413 (H) 150 - 400 K/uL   nRBC 0.0 0.0 - 0.2 %   Neutrophils Relative % 78 %   Neutro Abs 12.4 (H) 1.7 - 7.7 K/uL   Lymphocytes Relative 11 %   Lymphs Abs 1.8 0.7 - 4.0 K/uL   Monocytes Relative 9 %   Monocytes Absolute 1.4 (H) 0.1 - 1.0 K/uL   Eosinophils Relative 0 %   Eosinophils Absolute 0.1 0.0 - 0.5 K/uL   Basophils Relative 1 %   Basophils Absolute 0.1 0.0 - 0.1 K/uL   Immature Granulocytes 1 %   Abs Immature Granulocytes 0.09 (H) 0.00 - 0.07 K/uL      Assessment & Plan:   Problem List Items Addressed This Visit      Cardiovascular and Mediastinum   Essential hypertension    Under good control on current regimen. Continue current regimen. Continue to monitor. Call with any concerns. Refills given. BP running low, will continue to monitor closely- if stays low, will stop amlodipine.       Relevant Medications   metoprolol tartrate (LOPRESSOR) 50 MG tablet   amLODipine (NORVASC) 2.5 MG tablet      Endocrine   Hypothyroidism    Rechecking levels today. Await results. Treat as needed.       Relevant Medications   metoprolol tartrate (LOPRESSOR) 50 MG tablet   Other Relevant Orders   Comprehensive metabolic panel   TSH     Other   History of colon cancer    Needs CEA every 6 months. Continue to follow with oncology and GI. Call with any concerns.       Iron  deficiency anemia due to chronic blood loss    Will recheck levels. Treat as needed. Call with any concerns.       Relevant Orders   CBC with Differential/Platelet   Iron and TIBC   Ferritin    Other Visit Diagnoses    Right lower quadrant abdominal pain    -  Primary   Will treat with amitriptyline and lidocaine patches. Call with any concerns. Recheck 1 month.        Follow up plan: Return in about 5 weeks (around 08/28/2018) for Follow up.

## 2018-07-24 NOTE — Assessment & Plan Note (Signed)
Needs CEA every 6 months. Continue to follow with oncology and GI. Call with any concerns.

## 2018-07-24 NOTE — Assessment & Plan Note (Signed)
Will recheck levels. Treat as needed. Call with any concerns.

## 2018-07-24 NOTE — Telephone Encounter (Signed)
Aware. 

## 2018-07-24 NOTE — Assessment & Plan Note (Signed)
Rechecking levels today. Await results. Treat as needed.  

## 2018-07-24 NOTE — Assessment & Plan Note (Signed)
Under good control on current regimen. Continue current regimen. Continue to monitor. Call with any concerns. Refills given. BP running low, will continue to monitor closely- if stays low, will stop amlodipine.

## 2018-07-25 ENCOUNTER — Ambulatory Visit: Payer: Self-pay | Admitting: *Deleted

## 2018-07-25 ENCOUNTER — Telehealth: Payer: Self-pay | Admitting: Family Medicine

## 2018-07-25 DIAGNOSIS — E039 Hypothyroidism, unspecified: Secondary | ICD-10-CM

## 2018-07-25 LAB — CBC WITH DIFFERENTIAL/PLATELET
Basophils Absolute: 0.1 10*3/uL (ref 0.0–0.2)
Basos: 1 %
EOS (ABSOLUTE): 0.1 10*3/uL (ref 0.0–0.4)
Eos: 1 %
Hematocrit: 35.3 % (ref 34.0–46.6)
Hemoglobin: 11.6 g/dL (ref 11.1–15.9)
Immature Grans (Abs): 0 10*3/uL (ref 0.0–0.1)
Immature Granulocytes: 0 %
Lymphocytes Absolute: 2.2 10*3/uL (ref 0.7–3.1)
Lymphs: 23 %
MCH: 28 pg (ref 26.6–33.0)
MCHC: 32.9 g/dL (ref 31.5–35.7)
MCV: 85 fL (ref 79–97)
Monocytes Absolute: 0.9 10*3/uL (ref 0.1–0.9)
Monocytes: 9 %
Neutrophils Absolute: 6.5 10*3/uL (ref 1.4–7.0)
Neutrophils: 66 %
Platelets: 345 10*3/uL (ref 150–450)
RBC: 4.15 x10E6/uL (ref 3.77–5.28)
RDW: 14.8 % (ref 12.3–15.4)
WBC: 9.7 10*3/uL (ref 3.4–10.8)

## 2018-07-25 LAB — IRON AND TIBC
Iron Saturation: 14 % — ABNORMAL LOW (ref 15–55)
Iron: 42 ug/dL (ref 27–139)
Total Iron Binding Capacity: 290 ug/dL (ref 250–450)
UIBC: 248 ug/dL (ref 118–369)

## 2018-07-25 LAB — COMPREHENSIVE METABOLIC PANEL
ALT: 9 IU/L (ref 0–32)
AST: 21 IU/L (ref 0–40)
Albumin/Globulin Ratio: 1.6 (ref 1.2–2.2)
Albumin: 4.1 g/dL (ref 3.5–4.7)
Alkaline Phosphatase: 87 IU/L (ref 39–117)
BUN/Creatinine Ratio: 20 (ref 12–28)
BUN: 20 mg/dL (ref 8–27)
Bilirubin Total: 0.2 mg/dL (ref 0.0–1.2)
CO2: 21 mmol/L (ref 20–29)
Calcium: 9.2 mg/dL (ref 8.7–10.3)
Chloride: 98 mmol/L (ref 96–106)
Creatinine, Ser: 1.01 mg/dL — ABNORMAL HIGH (ref 0.57–1.00)
GFR calc Af Amer: 57 mL/min/{1.73_m2} — ABNORMAL LOW (ref >59)
GFR calc non Af Amer: 50 mL/min/{1.73_m2} — ABNORMAL LOW (ref >59)
Globulin, Total: 2.5 g/dL (ref 1.5–4.5)
Glucose: 107 mg/dL — ABNORMAL HIGH (ref 65–99)
Potassium: 4.9 mmol/L (ref 3.5–5.2)
Sodium: 137 mmol/L (ref 134–144)
Total Protein: 6.6 g/dL (ref 6.0–8.5)

## 2018-07-25 LAB — TSH: TSH: 0.244 u[IU]/mL — ABNORMAL LOW (ref 0.450–4.500)

## 2018-07-25 LAB — FERRITIN: Ferritin: 35 ng/mL (ref 15–150)

## 2018-07-25 MED ORDER — LEVOTHYROXINE SODIUM 88 MCG PO TABS: 88.0000 ug | ORAL_TABLET | Freq: Every day | ORAL | 2 refills | Status: DC

## 2018-07-25 NOTE — Telephone Encounter (Signed)
Please let her know that her labs look good except her thyroid is slightly over treated. I'd like her to drop to 39mcg (Rx at the pharmacy) and we'll recheck it in 6 weeks. Thanks!

## 2018-07-25 NOTE — Telephone Encounter (Signed)
Routing to PCP

## 2018-07-25 NOTE — Telephone Encounter (Signed)
Spoke with patient, she states that she is fine.

## 2018-07-25 NOTE — Telephone Encounter (Signed)
Copied from [place] [id number]. Topic: General - Other >> Jul 25, 2018  9:29 AM Lennox Solders wrote: Reason for CRM: pt saw dr Wynetta Emery yesterday. Pt can not afford amitriptyline cost is 500.00. Pt does not know what is covered on her insurance. Pt would like another medication . [facility] [address]

## 2018-07-25 NOTE — Telephone Encounter (Signed)
Patient notified

## 2018-07-25 NOTE — Telephone Encounter (Signed)
kecia from [facility] called and stated that she will see pt 07/27/18. FYI

## 2018-07-25 NOTE — Telephone Encounter (Signed)
Can we check to see if she needs anything

## 2018-07-25 NOTE — Telephone Encounter (Signed)
Pt other daughter and HCPOA Chrystal Henrene Pastor called as well. I have called [facility] [address]. The amitriptyline is covered and cost of $1.25. Chrystal/Debbie will pick up for pt. The Lidocaine 5% patches are not covered by insurance and are $500 out of pocket. The family cannot afford. The pharmacist advised OTC lidocaine 4% patches are available or that Dr. Wynetta Emery could try a different pain patch. Unfortunately the rejection did not offer covered alternatives.

## 2018-07-25 NOTE — Telephone Encounter (Signed)
Call was disconnected- Patient had called with concerns about her medication being too expensive- agent had discussed patient checking with pharmacist to see what would be a covered alternative- patient had also mentioned she was SOB. Agent was going to send the message about the medication to the office and I was going to talk to patient about her SOB- patient disconnected call. I called patient back- she was on the other line with pharmacy- she was upset and said that SOB was not new and she was upset that she had to call the pharmacy- I told her the message was current being forwarded to her PCP and offered to speak to her about her SOB- she hung up on me.

## 2018-07-27 DIAGNOSIS — I471 Supraventricular tachycardia: Secondary | ICD-10-CM | POA: Diagnosis not present

## 2018-07-27 DIAGNOSIS — I2510 Atherosclerotic heart disease of native coronary artery without angina pectoris: Secondary | ICD-10-CM | POA: Diagnosis not present

## 2018-07-27 DIAGNOSIS — I10 Essential (primary) hypertension: Secondary | ICD-10-CM | POA: Diagnosis not present

## 2018-07-27 DIAGNOSIS — D692 Other nonthrombocytopenic purpura: Secondary | ICD-10-CM | POA: Diagnosis not present

## 2018-07-27 DIAGNOSIS — I480 Paroxysmal atrial fibrillation: Secondary | ICD-10-CM | POA: Diagnosis not present

## 2018-07-27 DIAGNOSIS — Z7982 Long term (current) use of aspirin: Secondary | ICD-10-CM | POA: Diagnosis not present

## 2018-07-27 DIAGNOSIS — J449 Chronic obstructive pulmonary disease, unspecified: Secondary | ICD-10-CM | POA: Diagnosis not present

## 2018-07-27 DIAGNOSIS — Z483 Aftercare following surgery for neoplasm: Secondary | ICD-10-CM | POA: Diagnosis not present

## 2018-07-27 DIAGNOSIS — C183 Malignant neoplasm of hepatic flexure: Secondary | ICD-10-CM | POA: Diagnosis not present

## 2018-07-27 DIAGNOSIS — E038 Other specified hypothyroidism: Secondary | ICD-10-CM | POA: Diagnosis not present

## 2018-07-27 DIAGNOSIS — E440 Moderate protein-calorie malnutrition: Secondary | ICD-10-CM | POA: Diagnosis not present

## 2018-07-27 DIAGNOSIS — K921 Melena: Secondary | ICD-10-CM | POA: Diagnosis not present

## 2018-07-28 ENCOUNTER — Telehealth: Payer: Self-pay | Admitting: Family Medicine

## 2018-07-28 NOTE — Telephone Encounter (Signed)
Copied from [place] [id number]. Topic: Quick Communication - Home Health Verbal Orders >> Jul 28, 2018  5:10 PM Rutherford Nail, [place] wrote: Caller/AgencyJeannene Patella with [facility] Number: [phone] speak to Tamsen Meek Requesting OT/PT/Skilled Nursing/Social Work: Skilled Nursing Frequency: 2x a week for 4 weeks 2 PRN visits for complications to after care. Will be educating on management of after care symptoms and medications.

## 2018-07-29 NOTE — Telephone Encounter (Signed)
OK to give verbal orders 

## 2018-07-29 NOTE — Telephone Encounter (Signed)
Called and let Jenny Reichmann know that Dr. Wynetta Emery gave the ok for verbal orders requested.

## 2018-08-04 ENCOUNTER — Encounter: Payer: Self-pay | Admitting: Surgery

## 2018-08-05 DIAGNOSIS — C183 Malignant neoplasm of hepatic flexure: Secondary | ICD-10-CM | POA: Diagnosis not present

## 2018-08-05 DIAGNOSIS — I480 Paroxysmal atrial fibrillation: Secondary | ICD-10-CM | POA: Diagnosis not present

## 2018-08-05 DIAGNOSIS — E440 Moderate protein-calorie malnutrition: Secondary | ICD-10-CM | POA: Diagnosis not present

## 2018-08-05 DIAGNOSIS — I2510 Atherosclerotic heart disease of native coronary artery without angina pectoris: Secondary | ICD-10-CM | POA: Diagnosis not present

## 2018-08-05 DIAGNOSIS — S8012XA Contusion of left lower leg, initial encounter: Secondary | ICD-10-CM | POA: Diagnosis not present

## 2018-08-05 DIAGNOSIS — D692 Other nonthrombocytopenic purpura: Secondary | ICD-10-CM | POA: Diagnosis not present

## 2018-08-05 DIAGNOSIS — E038 Other specified hypothyroidism: Secondary | ICD-10-CM | POA: Diagnosis not present

## 2018-08-05 DIAGNOSIS — K921 Melena: Secondary | ICD-10-CM | POA: Diagnosis not present

## 2018-08-05 DIAGNOSIS — J449 Chronic obstructive pulmonary disease, unspecified: Secondary | ICD-10-CM | POA: Diagnosis not present

## 2018-08-05 DIAGNOSIS — I471 Supraventricular tachycardia: Secondary | ICD-10-CM | POA: Diagnosis not present

## 2018-08-05 DIAGNOSIS — Z483 Aftercare following surgery for neoplasm: Secondary | ICD-10-CM | POA: Diagnosis not present

## 2018-08-05 DIAGNOSIS — Z7982 Long term (current) use of aspirin: Secondary | ICD-10-CM | POA: Diagnosis not present

## 2018-08-05 DIAGNOSIS — I10 Essential (primary) hypertension: Secondary | ICD-10-CM | POA: Diagnosis not present

## 2018-08-06 ENCOUNTER — Ambulatory Visit: Payer: Medicare Other | Admitting: Internal Medicine

## 2018-08-06 ENCOUNTER — Encounter: Payer: Self-pay | Admitting: Internal Medicine

## 2018-08-06 ENCOUNTER — Ambulatory Visit (INDEPENDENT_AMBULATORY_CARE_PROVIDER_SITE_OTHER): Payer: Medicare Other | Admitting: Internal Medicine

## 2018-08-06 VITALS — BP 112/60 | HR 82 | Resp 16 | Ht 60.0 in | Wt 92.0 lb

## 2018-08-06 DIAGNOSIS — J449 Chronic obstructive pulmonary disease, unspecified: Secondary | ICD-10-CM

## 2018-08-06 MED ORDER — FLUTICASONE PROPIONATE 50 MCG/ACT NA SUSP: 2.0000 | Freq: Every day | NASAL | 2 refills | Status: DC

## 2018-08-06 NOTE — Progress Notes (Signed)
[facility] Consultation      Assessment and Plan:  COPD. - Continues to have some mild dyspnea on exertion when she is not using her Advair inhaler properly, using it only once daily. - Increase Advair to twice daily.  Dyspnea on exertion. - Mild dyspnea on exertion, likely multifactorial from COPD, deconditioning from recent hospitalization and colectomy. - Continue physical therapy. - As above increase Advair to twice daily.  Offered a rescue inhaler to use as needed however was declined due to cost.  Chronic rhinitis. - Recommended Flonase.  Return in about 6 months (around 02/05/2019).    Date: 08/06/2018  MRN# [id number] [name] 04-10-1928   [name] is a 84 y.o. old female seen in consultation for chief complaint of:    Chief Complaint  Patient presents with  . Follow-up    pt states breathing stable  . COPD    she uses 2 L 02 qhs and prn with exertion.  . Cough    mostly non productive    HPI:   The patient is a 84 year old female, with atrial fibrillation, emphysema on 2 L at night.  She was recently diagnosed with colon cancer in October 2019, and had a colectomy done. She was recently discharged earlier this month, she gets around with a cane.  She feels that her breathing is doing ok when she is exerting. She is using advair once daily, she uses 2L oxygen when she is up and around as needed. She has completed PT, and has home health.  Has constant sinus drainage, she does not take a nasal spray.   **Desat waslk 04/28/18; baseline at rest on RA was 95% and HR 71. Walked 180 feet at a slow pace, minimal dyspnea with sat of 94% and HR 72.   **CBC 02/07/2018>> absolute eosinophil count equals 400.   Medication:    Current Outpatient Medications:  .  acetaminophen (TYLENOL) 325 MG tablet, Take 2 tablets (650 mg total) by mouth every 6 (six) hours as needed for mild pain or fever., Disp: , Rfl:  .  amitriptyline (ELAVIL) 25 MG  tablet, Take 1 tablet (25 mg total) by mouth at bedtime., Disp: 90 tablet, Rfl: 1 .  amLODipine (NORVASC) 2.5 MG tablet, Take 1 tablet (2.5 mg total) by mouth daily., Disp: 90 tablet, Rfl: 1 .  aspirin EC 81 MG tablet, Take 1 tablet (81 mg total) by mouth daily., Disp: 100 tablet, Rfl: 4 .  feeding supplement, ENSURE ENLIVE, (ENSURE ENLIVE) LIQD, Take 237 mLs by mouth 2 (two) times daily between meals., Disp: 237 mL, Rfl: 12 .  ferrous sulfate 325 (65 FE) MG tablet, Take 1 tablet (325 mg total) by mouth 3 (three) times daily with meals., Disp: 90 tablet, Rfl: 3 .  Fluticasone-Salmeterol (ADVAIR DISKUS) 250-50 MCG/DOSE AEPB, Inhale 1 puff into the lungs daily. Rinse mouth after use., Disp: 1 each, Rfl: 2 .  HYDROcodone-acetaminophen (NORCO/VICODIN) 5-325 MG tablet, Take 1 tablet by mouth every 4 (four) hours as needed for moderate pain., Disp: 30 tablet, Rfl: 0 .  levothyroxine (SYNTHROID, LEVOTHROID) 88 MCG tablet, Take 1 tablet (88 mcg total) by mouth daily before breakfast., Disp: 30 tablet, Rfl: 2 .  lidocaine (LIDODERM) 5 %, Place 1 patch onto the skin 2 (two) times daily. Remove & Discard patch within 12 hours or as directed by MD, Disp: 60 patch, Rfl: 12 .  metoprolol tartrate (LOPRESSOR) 50 MG tablet, Take 1 tablet (50 mg total) by mouth 2 (two)  times daily., Disp: 180 tablet, Rfl: 1 .  multivitamin-lutein (OCUVITE-LUTEIN) CAPS capsule, Take 1 capsule by mouth daily., Disp: , Rfl: 0 .  OXYGEN, Inhale 2 L into the lungs. As needed during the day, and full use at bedtime, Disp: , Rfl:    Allergies:  Patient has no known allergies.  Review of Systems:  Constitutional: Feels well. Cardiovascular: Denies chest pain, exertional chest pain.  Pulmonary: Denies hemoptysis, pleuritic chest pain.   The remainder of systems were reviewed and were found to be negative other than what is documented in the HPI.    Physical Examination:   VS: BP 112/60 (BP Location: Left Arm, Cuff Size: Normal)    Pulse 82   Resp 16   Ht 5' (1.524 m)   Wt 92 lb (41.7 kg)   SpO2 96%   BMI 17.97 kg/m   General Appearance: No distress  Neuro:without focal findings, mental status, speech normal, alert and oriented HEENT: PERRLA, EOM intact Pulmonary: No wheezing, No rales  CardiovascularNormal S1,S2.  No m/r/g.  Abdomen: Benign, Soft, non-tender, No masses Renal:  No costovertebral tenderness  GU:  No performed at this time. Endoc: No evident thyromegaly, no signs of acromegaly or Cushing features Skin:   warm, no rashes, no ecchymosis  Extremities: normal, no cyanosis, clubbing.    LABORATORY PANEL:   CBC No results for input(s): WBC, HGB, HCT, PLT in the last 168 hours. ------------------------------------------------------------------------------------------------------------------  Chemistries  No results for input(s): NA, K, CL, CO2, GLUCOSE, BUN, CREATININE, CALCIUM, MG, AST, ALT, ALKPHOS, BILITOT in the last 168 hours.  Invalid input(s): GFRCGP ------------------------------------------------------------------------------------------------------------------  Cardiac Enzymes No results for input(s): TROPONINI in the last 168 hours. ------------------------------------------------------------  RADIOLOGY:  No results found.     Thank  you for the consultation and for allowing [facility] to assist in the care of your patient. Our recommendations are noted above.  Please contact us if we can be of further service.   Marda Stalker, M.D., F.C.C.P.  Board Certified in Internal Medicine, Pulmonary Medicine, [place], and Sleep Medicine.  [facility] Office Number: [phone]   08/06/2018

## 2018-08-06 NOTE — Patient Instructions (Signed)
Continue advair, use twice per day.

## 2018-08-06 NOTE — Addendum Note (Signed)
Addended by: Stephanie Coup on: 08/06/2018 12:01 PM   Modules accepted: Orders

## 2018-08-07 DIAGNOSIS — Z483 Aftercare following surgery for neoplasm: Secondary | ICD-10-CM | POA: Diagnosis not present

## 2018-08-07 DIAGNOSIS — C183 Malignant neoplasm of hepatic flexure: Secondary | ICD-10-CM | POA: Diagnosis not present

## 2018-08-07 DIAGNOSIS — I10 Essential (primary) hypertension: Secondary | ICD-10-CM | POA: Diagnosis not present

## 2018-08-07 DIAGNOSIS — K921 Melena: Secondary | ICD-10-CM | POA: Diagnosis not present

## 2018-08-07 DIAGNOSIS — I480 Paroxysmal atrial fibrillation: Secondary | ICD-10-CM | POA: Diagnosis not present

## 2018-08-07 DIAGNOSIS — I471 Supraventricular tachycardia: Secondary | ICD-10-CM | POA: Diagnosis not present

## 2018-08-07 DIAGNOSIS — I2510 Atherosclerotic heart disease of native coronary artery without angina pectoris: Secondary | ICD-10-CM | POA: Diagnosis not present

## 2018-08-07 DIAGNOSIS — Z7982 Long term (current) use of aspirin: Secondary | ICD-10-CM | POA: Diagnosis not present

## 2018-08-07 DIAGNOSIS — J449 Chronic obstructive pulmonary disease, unspecified: Secondary | ICD-10-CM | POA: Diagnosis not present

## 2018-08-07 DIAGNOSIS — E440 Moderate protein-calorie malnutrition: Secondary | ICD-10-CM | POA: Diagnosis not present

## 2018-08-07 DIAGNOSIS — D692 Other nonthrombocytopenic purpura: Secondary | ICD-10-CM | POA: Diagnosis not present

## 2018-08-07 DIAGNOSIS — E038 Other specified hypothyroidism: Secondary | ICD-10-CM | POA: Diagnosis not present

## 2018-08-08 DIAGNOSIS — K921 Melena: Secondary | ICD-10-CM | POA: Diagnosis not present

## 2018-08-08 DIAGNOSIS — J449 Chronic obstructive pulmonary disease, unspecified: Secondary | ICD-10-CM | POA: Diagnosis not present

## 2018-08-08 DIAGNOSIS — I2510 Atherosclerotic heart disease of native coronary artery without angina pectoris: Secondary | ICD-10-CM | POA: Diagnosis not present

## 2018-08-08 DIAGNOSIS — J432 Centrilobular emphysema: Secondary | ICD-10-CM | POA: Diagnosis not present

## 2018-08-08 DIAGNOSIS — I480 Paroxysmal atrial fibrillation: Secondary | ICD-10-CM | POA: Diagnosis not present

## 2018-08-08 DIAGNOSIS — D692 Other nonthrombocytopenic purpura: Secondary | ICD-10-CM | POA: Diagnosis not present

## 2018-08-08 DIAGNOSIS — C183 Malignant neoplasm of hepatic flexure: Secondary | ICD-10-CM | POA: Diagnosis not present

## 2018-08-08 DIAGNOSIS — I10 Essential (primary) hypertension: Secondary | ICD-10-CM | POA: Diagnosis not present

## 2018-08-08 DIAGNOSIS — I471 Supraventricular tachycardia: Secondary | ICD-10-CM | POA: Diagnosis not present

## 2018-08-08 DIAGNOSIS — E440 Moderate protein-calorie malnutrition: Secondary | ICD-10-CM | POA: Diagnosis not present

## 2018-08-08 DIAGNOSIS — Z7982 Long term (current) use of aspirin: Secondary | ICD-10-CM | POA: Diagnosis not present

## 2018-08-08 DIAGNOSIS — E038 Other specified hypothyroidism: Secondary | ICD-10-CM | POA: Diagnosis not present

## 2018-08-08 DIAGNOSIS — Z483 Aftercare following surgery for neoplasm: Secondary | ICD-10-CM | POA: Diagnosis not present

## 2018-08-11 DIAGNOSIS — I10 Essential (primary) hypertension: Secondary | ICD-10-CM | POA: Diagnosis not present

## 2018-08-11 DIAGNOSIS — C183 Malignant neoplasm of hepatic flexure: Secondary | ICD-10-CM | POA: Diagnosis not present

## 2018-08-11 DIAGNOSIS — I471 Supraventricular tachycardia: Secondary | ICD-10-CM | POA: Diagnosis not present

## 2018-08-11 DIAGNOSIS — D692 Other nonthrombocytopenic purpura: Secondary | ICD-10-CM | POA: Diagnosis not present

## 2018-08-11 DIAGNOSIS — I480 Paroxysmal atrial fibrillation: Secondary | ICD-10-CM | POA: Diagnosis not present

## 2018-08-11 DIAGNOSIS — E038 Other specified hypothyroidism: Secondary | ICD-10-CM | POA: Diagnosis not present

## 2018-08-11 DIAGNOSIS — Z7982 Long term (current) use of aspirin: Secondary | ICD-10-CM | POA: Diagnosis not present

## 2018-08-11 DIAGNOSIS — I2510 Atherosclerotic heart disease of native coronary artery without angina pectoris: Secondary | ICD-10-CM | POA: Diagnosis not present

## 2018-08-11 DIAGNOSIS — K921 Melena: Secondary | ICD-10-CM | POA: Diagnosis not present

## 2018-08-11 DIAGNOSIS — Z483 Aftercare following surgery for neoplasm: Secondary | ICD-10-CM | POA: Diagnosis not present

## 2018-08-11 DIAGNOSIS — J449 Chronic obstructive pulmonary disease, unspecified: Secondary | ICD-10-CM | POA: Diagnosis not present

## 2018-08-11 DIAGNOSIS — E440 Moderate protein-calorie malnutrition: Secondary | ICD-10-CM | POA: Diagnosis not present

## 2018-08-12 DIAGNOSIS — I10 Essential (primary) hypertension: Secondary | ICD-10-CM | POA: Diagnosis not present

## 2018-08-12 DIAGNOSIS — C183 Malignant neoplasm of hepatic flexure: Secondary | ICD-10-CM | POA: Diagnosis not present

## 2018-08-12 DIAGNOSIS — E440 Moderate protein-calorie malnutrition: Secondary | ICD-10-CM | POA: Diagnosis not present

## 2018-08-12 DIAGNOSIS — I471 Supraventricular tachycardia: Secondary | ICD-10-CM | POA: Diagnosis not present

## 2018-08-12 DIAGNOSIS — J449 Chronic obstructive pulmonary disease, unspecified: Secondary | ICD-10-CM | POA: Diagnosis not present

## 2018-08-12 DIAGNOSIS — K921 Melena: Secondary | ICD-10-CM | POA: Diagnosis not present

## 2018-08-12 DIAGNOSIS — I480 Paroxysmal atrial fibrillation: Secondary | ICD-10-CM | POA: Diagnosis not present

## 2018-08-12 DIAGNOSIS — I2510 Atherosclerotic heart disease of native coronary artery without angina pectoris: Secondary | ICD-10-CM | POA: Diagnosis not present

## 2018-08-12 DIAGNOSIS — E038 Other specified hypothyroidism: Secondary | ICD-10-CM | POA: Diagnosis not present

## 2018-08-12 DIAGNOSIS — D692 Other nonthrombocytopenic purpura: Secondary | ICD-10-CM | POA: Diagnosis not present

## 2018-08-12 DIAGNOSIS — Z7982 Long term (current) use of aspirin: Secondary | ICD-10-CM | POA: Diagnosis not present

## 2018-08-12 DIAGNOSIS — Z483 Aftercare following surgery for neoplasm: Secondary | ICD-10-CM | POA: Diagnosis not present

## 2018-08-13 DIAGNOSIS — Z483 Aftercare following surgery for neoplasm: Secondary | ICD-10-CM | POA: Diagnosis not present

## 2018-08-13 DIAGNOSIS — C183 Malignant neoplasm of hepatic flexure: Secondary | ICD-10-CM | POA: Diagnosis not present

## 2018-08-13 DIAGNOSIS — I480 Paroxysmal atrial fibrillation: Secondary | ICD-10-CM | POA: Diagnosis not present

## 2018-08-13 DIAGNOSIS — Z7982 Long term (current) use of aspirin: Secondary | ICD-10-CM | POA: Diagnosis not present

## 2018-08-13 DIAGNOSIS — J449 Chronic obstructive pulmonary disease, unspecified: Secondary | ICD-10-CM | POA: Diagnosis not present

## 2018-08-13 DIAGNOSIS — K921 Melena: Secondary | ICD-10-CM | POA: Diagnosis not present

## 2018-08-13 DIAGNOSIS — E038 Other specified hypothyroidism: Secondary | ICD-10-CM | POA: Diagnosis not present

## 2018-08-13 DIAGNOSIS — I2510 Atherosclerotic heart disease of native coronary artery without angina pectoris: Secondary | ICD-10-CM | POA: Diagnosis not present

## 2018-08-13 DIAGNOSIS — E440 Moderate protein-calorie malnutrition: Secondary | ICD-10-CM | POA: Diagnosis not present

## 2018-08-13 DIAGNOSIS — I10 Essential (primary) hypertension: Secondary | ICD-10-CM | POA: Diagnosis not present

## 2018-08-13 DIAGNOSIS — I471 Supraventricular tachycardia: Secondary | ICD-10-CM | POA: Diagnosis not present

## 2018-08-13 DIAGNOSIS — D692 Other nonthrombocytopenic purpura: Secondary | ICD-10-CM | POA: Diagnosis not present

## 2018-08-15 DIAGNOSIS — I480 Paroxysmal atrial fibrillation: Secondary | ICD-10-CM | POA: Diagnosis not present

## 2018-08-15 DIAGNOSIS — E440 Moderate protein-calorie malnutrition: Secondary | ICD-10-CM | POA: Diagnosis not present

## 2018-08-15 DIAGNOSIS — K921 Melena: Secondary | ICD-10-CM | POA: Diagnosis not present

## 2018-08-15 DIAGNOSIS — Z483 Aftercare following surgery for neoplasm: Secondary | ICD-10-CM | POA: Diagnosis not present

## 2018-08-15 DIAGNOSIS — I2510 Atherosclerotic heart disease of native coronary artery without angina pectoris: Secondary | ICD-10-CM | POA: Diagnosis not present

## 2018-08-15 DIAGNOSIS — Z7982 Long term (current) use of aspirin: Secondary | ICD-10-CM | POA: Diagnosis not present

## 2018-08-15 DIAGNOSIS — J449 Chronic obstructive pulmonary disease, unspecified: Secondary | ICD-10-CM | POA: Diagnosis not present

## 2018-08-15 DIAGNOSIS — C183 Malignant neoplasm of hepatic flexure: Secondary | ICD-10-CM | POA: Diagnosis not present

## 2018-08-15 DIAGNOSIS — I10 Essential (primary) hypertension: Secondary | ICD-10-CM | POA: Diagnosis not present

## 2018-08-15 DIAGNOSIS — D692 Other nonthrombocytopenic purpura: Secondary | ICD-10-CM | POA: Diagnosis not present

## 2018-08-15 DIAGNOSIS — I471 Supraventricular tachycardia: Secondary | ICD-10-CM | POA: Diagnosis not present

## 2018-08-15 DIAGNOSIS — E038 Other specified hypothyroidism: Secondary | ICD-10-CM | POA: Diagnosis not present

## 2018-08-18 ENCOUNTER — Telehealth: Payer: Self-pay | Admitting: Family Medicine

## 2018-08-18 NOTE — Telephone Encounter (Signed)
Copied from [place] [id number]. Topic: Quick Communication - Home Health Verbal Orders >> Aug 18, 2018 11:24 AM Margot Ables wrote: Caller/Agency: Wes PT with [facility] Number: [phone], secure VM if no answer Requesting OT/PT/Skilled Nursing/Social Work: PT Frequency: requesting VO for PT 2x week for 3 weeks, 1x week for 1 week

## 2018-08-18 NOTE — Telephone Encounter (Signed)
OK for verbal orders?

## 2018-08-18 NOTE — Telephone Encounter (Signed)
Verbal orders okay

## 2018-08-18 NOTE — Telephone Encounter (Signed)
Wes notified of Dr. Durenda Age ok for verbal orders.

## 2018-08-19 DIAGNOSIS — E038 Other specified hypothyroidism: Secondary | ICD-10-CM | POA: Diagnosis not present

## 2018-08-19 DIAGNOSIS — C183 Malignant neoplasm of hepatic flexure: Secondary | ICD-10-CM | POA: Diagnosis not present

## 2018-08-19 DIAGNOSIS — J449 Chronic obstructive pulmonary disease, unspecified: Secondary | ICD-10-CM | POA: Diagnosis not present

## 2018-08-19 DIAGNOSIS — I471 Supraventricular tachycardia: Secondary | ICD-10-CM | POA: Diagnosis not present

## 2018-08-19 DIAGNOSIS — D692 Other nonthrombocytopenic purpura: Secondary | ICD-10-CM | POA: Diagnosis not present

## 2018-08-19 DIAGNOSIS — Z483 Aftercare following surgery for neoplasm: Secondary | ICD-10-CM | POA: Diagnosis not present

## 2018-08-19 DIAGNOSIS — K921 Melena: Secondary | ICD-10-CM | POA: Diagnosis not present

## 2018-08-19 DIAGNOSIS — I480 Paroxysmal atrial fibrillation: Secondary | ICD-10-CM | POA: Diagnosis not present

## 2018-08-19 DIAGNOSIS — Z7982 Long term (current) use of aspirin: Secondary | ICD-10-CM | POA: Diagnosis not present

## 2018-08-19 DIAGNOSIS — E440 Moderate protein-calorie malnutrition: Secondary | ICD-10-CM | POA: Diagnosis not present

## 2018-08-19 DIAGNOSIS — I2510 Atherosclerotic heart disease of native coronary artery without angina pectoris: Secondary | ICD-10-CM | POA: Diagnosis not present

## 2018-08-19 DIAGNOSIS — I10 Essential (primary) hypertension: Secondary | ICD-10-CM | POA: Diagnosis not present

## 2018-08-20 DIAGNOSIS — C183 Malignant neoplasm of hepatic flexure: Secondary | ICD-10-CM | POA: Diagnosis not present

## 2018-08-20 DIAGNOSIS — I480 Paroxysmal atrial fibrillation: Secondary | ICD-10-CM | POA: Diagnosis not present

## 2018-08-20 DIAGNOSIS — I10 Essential (primary) hypertension: Secondary | ICD-10-CM | POA: Diagnosis not present

## 2018-08-20 DIAGNOSIS — E440 Moderate protein-calorie malnutrition: Secondary | ICD-10-CM | POA: Diagnosis not present

## 2018-08-20 DIAGNOSIS — E038 Other specified hypothyroidism: Secondary | ICD-10-CM | POA: Diagnosis not present

## 2018-08-20 DIAGNOSIS — D692 Other nonthrombocytopenic purpura: Secondary | ICD-10-CM | POA: Diagnosis not present

## 2018-08-20 DIAGNOSIS — Z7982 Long term (current) use of aspirin: Secondary | ICD-10-CM | POA: Diagnosis not present

## 2018-08-20 DIAGNOSIS — K921 Melena: Secondary | ICD-10-CM | POA: Diagnosis not present

## 2018-08-20 DIAGNOSIS — I471 Supraventricular tachycardia: Secondary | ICD-10-CM | POA: Diagnosis not present

## 2018-08-20 DIAGNOSIS — J449 Chronic obstructive pulmonary disease, unspecified: Secondary | ICD-10-CM | POA: Diagnosis not present

## 2018-08-20 DIAGNOSIS — I2510 Atherosclerotic heart disease of native coronary artery without angina pectoris: Secondary | ICD-10-CM | POA: Diagnosis not present

## 2018-08-20 DIAGNOSIS — Z483 Aftercare following surgery for neoplasm: Secondary | ICD-10-CM | POA: Diagnosis not present

## 2018-08-21 DIAGNOSIS — I480 Paroxysmal atrial fibrillation: Secondary | ICD-10-CM | POA: Diagnosis not present

## 2018-08-21 DIAGNOSIS — E038 Other specified hypothyroidism: Secondary | ICD-10-CM | POA: Diagnosis not present

## 2018-08-21 DIAGNOSIS — K921 Melena: Secondary | ICD-10-CM | POA: Diagnosis not present

## 2018-08-21 DIAGNOSIS — I10 Essential (primary) hypertension: Secondary | ICD-10-CM | POA: Diagnosis not present

## 2018-08-21 DIAGNOSIS — E440 Moderate protein-calorie malnutrition: Secondary | ICD-10-CM | POA: Diagnosis not present

## 2018-08-21 DIAGNOSIS — D692 Other nonthrombocytopenic purpura: Secondary | ICD-10-CM | POA: Diagnosis not present

## 2018-08-21 DIAGNOSIS — I2510 Atherosclerotic heart disease of native coronary artery without angina pectoris: Secondary | ICD-10-CM | POA: Diagnosis not present

## 2018-08-21 DIAGNOSIS — C183 Malignant neoplasm of hepatic flexure: Secondary | ICD-10-CM | POA: Diagnosis not present

## 2018-08-21 DIAGNOSIS — J449 Chronic obstructive pulmonary disease, unspecified: Secondary | ICD-10-CM | POA: Diagnosis not present

## 2018-08-21 DIAGNOSIS — Z7982 Long term (current) use of aspirin: Secondary | ICD-10-CM | POA: Diagnosis not present

## 2018-08-21 DIAGNOSIS — I471 Supraventricular tachycardia: Secondary | ICD-10-CM | POA: Diagnosis not present

## 2018-08-21 DIAGNOSIS — Z483 Aftercare following surgery for neoplasm: Secondary | ICD-10-CM | POA: Diagnosis not present

## 2018-08-22 DIAGNOSIS — I2510 Atherosclerotic heart disease of native coronary artery without angina pectoris: Secondary | ICD-10-CM | POA: Diagnosis not present

## 2018-08-22 DIAGNOSIS — K921 Melena: Secondary | ICD-10-CM | POA: Diagnosis not present

## 2018-08-22 DIAGNOSIS — C183 Malignant neoplasm of hepatic flexure: Secondary | ICD-10-CM | POA: Diagnosis not present

## 2018-08-22 DIAGNOSIS — E440 Moderate protein-calorie malnutrition: Secondary | ICD-10-CM | POA: Diagnosis not present

## 2018-08-22 DIAGNOSIS — I471 Supraventricular tachycardia: Secondary | ICD-10-CM | POA: Diagnosis not present

## 2018-08-22 DIAGNOSIS — I480 Paroxysmal atrial fibrillation: Secondary | ICD-10-CM | POA: Diagnosis not present

## 2018-08-22 DIAGNOSIS — Z483 Aftercare following surgery for neoplasm: Secondary | ICD-10-CM | POA: Diagnosis not present

## 2018-08-22 DIAGNOSIS — I10 Essential (primary) hypertension: Secondary | ICD-10-CM | POA: Diagnosis not present

## 2018-08-22 DIAGNOSIS — E038 Other specified hypothyroidism: Secondary | ICD-10-CM | POA: Diagnosis not present

## 2018-08-22 DIAGNOSIS — Z7982 Long term (current) use of aspirin: Secondary | ICD-10-CM | POA: Diagnosis not present

## 2018-08-22 DIAGNOSIS — D692 Other nonthrombocytopenic purpura: Secondary | ICD-10-CM | POA: Diagnosis not present

## 2018-08-22 DIAGNOSIS — J449 Chronic obstructive pulmonary disease, unspecified: Secondary | ICD-10-CM | POA: Diagnosis not present

## 2018-08-24 DIAGNOSIS — J449 Chronic obstructive pulmonary disease, unspecified: Secondary | ICD-10-CM | POA: Diagnosis not present

## 2018-08-24 DIAGNOSIS — Z483 Aftercare following surgery for neoplasm: Secondary | ICD-10-CM | POA: Diagnosis not present

## 2018-08-24 DIAGNOSIS — I480 Paroxysmal atrial fibrillation: Secondary | ICD-10-CM | POA: Diagnosis not present

## 2018-08-24 DIAGNOSIS — I2510 Atherosclerotic heart disease of native coronary artery without angina pectoris: Secondary | ICD-10-CM | POA: Diagnosis not present

## 2018-08-24 DIAGNOSIS — K921 Melena: Secondary | ICD-10-CM | POA: Diagnosis not present

## 2018-08-24 DIAGNOSIS — C183 Malignant neoplasm of hepatic flexure: Secondary | ICD-10-CM | POA: Diagnosis not present

## 2018-08-24 DIAGNOSIS — Z7982 Long term (current) use of aspirin: Secondary | ICD-10-CM | POA: Diagnosis not present

## 2018-08-24 DIAGNOSIS — I471 Supraventricular tachycardia: Secondary | ICD-10-CM | POA: Diagnosis not present

## 2018-08-24 DIAGNOSIS — E440 Moderate protein-calorie malnutrition: Secondary | ICD-10-CM | POA: Diagnosis not present

## 2018-08-24 DIAGNOSIS — D692 Other nonthrombocytopenic purpura: Secondary | ICD-10-CM | POA: Diagnosis not present

## 2018-08-24 DIAGNOSIS — E038 Other specified hypothyroidism: Secondary | ICD-10-CM | POA: Diagnosis not present

## 2018-08-24 DIAGNOSIS — I10 Essential (primary) hypertension: Secondary | ICD-10-CM | POA: Diagnosis not present

## 2018-08-25 DIAGNOSIS — I471 Supraventricular tachycardia: Secondary | ICD-10-CM | POA: Diagnosis not present

## 2018-08-25 DIAGNOSIS — E440 Moderate protein-calorie malnutrition: Secondary | ICD-10-CM | POA: Diagnosis not present

## 2018-08-25 DIAGNOSIS — D692 Other nonthrombocytopenic purpura: Secondary | ICD-10-CM | POA: Diagnosis not present

## 2018-08-25 DIAGNOSIS — Z483 Aftercare following surgery for neoplasm: Secondary | ICD-10-CM | POA: Diagnosis not present

## 2018-08-25 DIAGNOSIS — C183 Malignant neoplasm of hepatic flexure: Secondary | ICD-10-CM | POA: Diagnosis not present

## 2018-08-25 DIAGNOSIS — J449 Chronic obstructive pulmonary disease, unspecified: Secondary | ICD-10-CM | POA: Diagnosis not present

## 2018-08-25 DIAGNOSIS — E038 Other specified hypothyroidism: Secondary | ICD-10-CM | POA: Diagnosis not present

## 2018-08-25 DIAGNOSIS — I480 Paroxysmal atrial fibrillation: Secondary | ICD-10-CM | POA: Diagnosis not present

## 2018-08-25 DIAGNOSIS — Z7982 Long term (current) use of aspirin: Secondary | ICD-10-CM | POA: Diagnosis not present

## 2018-08-25 DIAGNOSIS — I10 Essential (primary) hypertension: Secondary | ICD-10-CM | POA: Diagnosis not present

## 2018-08-25 DIAGNOSIS — K921 Melena: Secondary | ICD-10-CM | POA: Diagnosis not present

## 2018-08-25 DIAGNOSIS — I2510 Atherosclerotic heart disease of native coronary artery without angina pectoris: Secondary | ICD-10-CM | POA: Diagnosis not present

## 2018-08-28 DIAGNOSIS — Z7982 Long term (current) use of aspirin: Secondary | ICD-10-CM | POA: Diagnosis not present

## 2018-08-28 DIAGNOSIS — Z483 Aftercare following surgery for neoplasm: Secondary | ICD-10-CM | POA: Diagnosis not present

## 2018-08-28 DIAGNOSIS — I2510 Atherosclerotic heart disease of native coronary artery without angina pectoris: Secondary | ICD-10-CM | POA: Diagnosis not present

## 2018-08-28 DIAGNOSIS — C183 Malignant neoplasm of hepatic flexure: Secondary | ICD-10-CM | POA: Diagnosis not present

## 2018-08-28 DIAGNOSIS — I480 Paroxysmal atrial fibrillation: Secondary | ICD-10-CM | POA: Diagnosis not present

## 2018-08-28 DIAGNOSIS — I10 Essential (primary) hypertension: Secondary | ICD-10-CM | POA: Diagnosis not present

## 2018-08-28 DIAGNOSIS — D692 Other nonthrombocytopenic purpura: Secondary | ICD-10-CM | POA: Diagnosis not present

## 2018-08-28 DIAGNOSIS — E038 Other specified hypothyroidism: Secondary | ICD-10-CM | POA: Diagnosis not present

## 2018-08-28 DIAGNOSIS — I471 Supraventricular tachycardia: Secondary | ICD-10-CM | POA: Diagnosis not present

## 2018-08-28 DIAGNOSIS — K921 Melena: Secondary | ICD-10-CM | POA: Diagnosis not present

## 2018-08-28 DIAGNOSIS — E440 Moderate protein-calorie malnutrition: Secondary | ICD-10-CM | POA: Diagnosis not present

## 2018-08-28 DIAGNOSIS — J449 Chronic obstructive pulmonary disease, unspecified: Secondary | ICD-10-CM | POA: Diagnosis not present

## 2018-09-01 ENCOUNTER — Ambulatory Visit (INDEPENDENT_AMBULATORY_CARE_PROVIDER_SITE_OTHER): Payer: Medicare Other | Admitting: Family Medicine

## 2018-09-01 ENCOUNTER — Encounter: Payer: Self-pay | Admitting: Family Medicine

## 2018-09-01 VITALS — BP 145/63 | HR 80 | Temp 98.0°F | Wt 93.4 lb

## 2018-09-01 DIAGNOSIS — C183 Malignant neoplasm of hepatic flexure: Secondary | ICD-10-CM | POA: Diagnosis not present

## 2018-09-01 DIAGNOSIS — I471 Supraventricular tachycardia: Secondary | ICD-10-CM | POA: Diagnosis not present

## 2018-09-01 DIAGNOSIS — I2510 Atherosclerotic heart disease of native coronary artery without angina pectoris: Secondary | ICD-10-CM | POA: Diagnosis not present

## 2018-09-01 DIAGNOSIS — N3941 Urge incontinence: Secondary | ICD-10-CM

## 2018-09-01 DIAGNOSIS — I480 Paroxysmal atrial fibrillation: Secondary | ICD-10-CM | POA: Diagnosis not present

## 2018-09-01 DIAGNOSIS — I10 Essential (primary) hypertension: Secondary | ICD-10-CM

## 2018-09-01 DIAGNOSIS — H8113 Benign paroxysmal vertigo, bilateral: Secondary | ICD-10-CM

## 2018-09-01 DIAGNOSIS — E440 Moderate protein-calorie malnutrition: Secondary | ICD-10-CM | POA: Diagnosis not present

## 2018-09-01 DIAGNOSIS — N3281 Overactive bladder: Secondary | ICD-10-CM | POA: Diagnosis not present

## 2018-09-01 DIAGNOSIS — Z7982 Long term (current) use of aspirin: Secondary | ICD-10-CM | POA: Diagnosis not present

## 2018-09-01 DIAGNOSIS — E038 Other specified hypothyroidism: Secondary | ICD-10-CM | POA: Diagnosis not present

## 2018-09-01 DIAGNOSIS — Z483 Aftercare following surgery for neoplasm: Secondary | ICD-10-CM | POA: Diagnosis not present

## 2018-09-01 DIAGNOSIS — D692 Other nonthrombocytopenic purpura: Secondary | ICD-10-CM | POA: Diagnosis not present

## 2018-09-01 DIAGNOSIS — K921 Melena: Secondary | ICD-10-CM | POA: Diagnosis not present

## 2018-09-01 DIAGNOSIS — J449 Chronic obstructive pulmonary disease, unspecified: Secondary | ICD-10-CM | POA: Diagnosis not present

## 2018-09-01 NOTE — Progress Notes (Signed)
BP (!) 145/63   Pulse 80   Temp 98 F (36.7 C) (Oral)   Wt 93 lb 6.4 oz (42.4 kg)   SpO2 94%   BMI 18.24 kg/m    Subjective:    Patient ID: [name], female    DOB: 03/02/1928, 84 y.o.   MRN: [id number]  HPI: [name] is a 84 y.o. female  Chief Complaint  Patient presents with  . Hypertension    5 week f/up   . Medication Refill    pt states she would like a refill on solifenacin 10 mg for her bladder  . Dizziness    pt states she has dizzy spells, especially when getting out of bed. States it also happens when moving head side to side    DIZZINESS Duration: months Description of symptoms: room spinning Duration of episode: minutes Dizziness frequency: recurrent Provoking factors: head movement Aggravating factors:  Moving her head Triggered by rolling over in bed: yes Triggered by bending over: yes Aggravated by head movement: yes Aggravated by exertion, coughing, loud noises: no Recent head injury: no Recent or current viral symptoms: no History of vasovagal episodes: no Nausea: no Vomiting: no Tinnitus: no Hearing loss: no Aural fullness: no Headache: no Photophobia/phonophobia: no Unsteady gait: no Postural instability: no Diplopia, dysarthria, dysphagia or weakness: no Related to exertion: no Pallor: no Diaphoresis: no Dyspnea: no Chest pain: no  HYPERTENSION Hypertension status: stable  Satisfied with current treatment? yes Duration of hypertension: chronic BP monitoring frequency:  not checking BP medication side effects:  no Medication compliance: excellent compliance Aspirin: no Recurrent headaches: no Visual changes: no Palpitations: no Dyspnea: no Chest pain: no Lower extremity edema: no Dizzy/lightheaded: yes  Relevant past medical, surgical, family and social history reviewed and updated as indicated. Interim medical history since our last visit reviewed. Allergies and medications reviewed and updated.  Review  of Systems  Constitutional: Negative.   Respiratory: Negative.   Cardiovascular: Negative.   Genitourinary: Positive for urgency. Negative for decreased urine volume, difficulty urinating, dyspareunia, dysuria, enuresis, flank pain, frequency, genital sores, hematuria, menstrual problem, pelvic pain, vaginal bleeding, vaginal discharge and vaginal pain.  Musculoskeletal: Negative.   Neurological: Negative.   Psychiatric/Behavioral: Negative.     Per HPI unless specifically indicated above     Objective:    BP (!) 145/63   Pulse 80   Temp 98 F (36.7 C) (Oral)   Wt 93 lb 6.4 oz (42.4 kg)   SpO2 94%   BMI 18.24 kg/m   Wt Readings from Last 3 Encounters:  09/01/18 93 lb 6.4 oz (42.4 kg)  08/06/18 92 lb (41.7 kg)  07/24/18 88 lb 3.2 oz (40 kg)    Physical Exam Vitals signs and nursing note reviewed.  Constitutional:      General: She is not in acute distress.    Appearance: Normal appearance. She is not ill-appearing, toxic-appearing or diaphoretic.  HENT:     Head: Normocephalic and atraumatic.     Right Ear: External ear normal.     Left Ear: External ear normal.     Nose: Nose normal.     Mouth/Throat:     Mouth: Mucous membranes are moist.     Pharynx: Oropharynx is clear.  Eyes:     General: No scleral icterus.       Right eye: No discharge.        Left eye: No discharge.     Extraocular Movements: Extraocular movements intact.  Right eye: Nystagmus present.     Left eye: Nystagmus present.     Conjunctiva/sclera: Conjunctivae normal.     Pupils: Pupils are equal, round, and reactive to light.  Neck:     Musculoskeletal: Normal range of motion and neck supple.  Cardiovascular:     Rate and Rhythm: Normal rate and regular rhythm.     Pulses: Normal pulses.     Heart sounds: Normal heart sounds. No murmur. No friction rub. No gallop.   Pulmonary:     Effort: Pulmonary effort is normal. No respiratory distress.     Breath sounds: Normal breath sounds. No  stridor. No wheezing, rhonchi or rales.  Chest:     Chest wall: No tenderness.  Musculoskeletal: Normal range of motion.  Skin:    General: Skin is warm and dry.     Capillary Refill: Capillary refill takes less than 2 seconds.     Coloration: Skin is not jaundiced or pale.     Findings: No bruising, erythema, lesion or rash.  Neurological:     General: No focal deficit present.     Mental Status: She is alert and oriented to person, place, and time. Mental status is at baseline.  Psychiatric:        Mood and Affect: Mood normal.        Behavior: Behavior normal.        Thought Content: Thought content normal.        Judgment: Judgment normal.     Results for orders placed or performed in visit on 07/24/18  Comprehensive metabolic panel  Result Value Ref Range   Glucose 107 (H) 65 - 99 mg/dL   BUN 20 8 - 27 mg/dL   Creatinine, Ser 1.01 (H) 0.57 - 1.00 mg/dL   GFR calc non Af Amer 50 (L) >59 mL/min/1.73   GFR calc Af Amer 57 (L) >59 mL/min/1.73   BUN/Creatinine Ratio 20 12 - 28   Sodium 137 134 - 144 mmol/L   Potassium 4.9 3.5 - 5.2 mmol/L   Chloride 98 96 - 106 mmol/L   CO2 21 20 - 29 mmol/L   Calcium 9.2 8.7 - 10.3 mg/dL   Total Protein 6.6 6.0 - 8.5 g/dL   Albumin 4.1 3.5 - 4.7 g/dL   Globulin, Total 2.5 1.5 - 4.5 g/dL   Albumin/Globulin Ratio 1.6 1.2 - 2.2   Bilirubin Total 0.2 0.0 - 1.2 mg/dL   Alkaline Phosphatase 87 39 - 117 IU/L   AST 21 0 - 40 IU/L   ALT 9 0 - 32 IU/L  CBC with Differential/Platelet  Result Value Ref Range   WBC 9.7 3.4 - 10.8 x10E3/uL   RBC 4.15 3.77 - 5.28 x10E6/uL   Hemoglobin 11.6 11.1 - 15.9 g/dL   Hematocrit 35.3 34.0 - 46.6 %   MCV 85 79 - 97 fL   MCH 28.0 26.6 - 33.0 pg   MCHC 32.9 31.5 - 35.7 g/dL   RDW 14.8 12.3 - 15.4 %   Platelets 345 150 - 450 x10E3/uL   Neutrophils 66 Not Estab. %   Lymphs 23 Not Estab. %   Monocytes 9 Not Estab. %   Eos 1 Not Estab. %   Basos 1 Not Estab. %   Neutrophils Absolute 6.5 1.4 - 7.0 x10E3/uL     Lymphocytes Absolute 2.2 0.7 - 3.1 x10E3/uL   Monocytes Absolute 0.9 0.1 - 0.9 x10E3/uL   EOS (ABSOLUTE) 0.1 0.0 - 0.4 x10E3/uL   Basophils Absolute  0.1 0.0 - 0.2 x10E3/uL   Immature Granulocytes 0 Not Estab. %   Immature Grans (Abs) 0.0 0.0 - 0.1 x10E3/uL  Iron and TIBC  Result Value Ref Range   Total Iron Binding Capacity 290 250 - 450 ug/dL   UIBC 248 118 - 369 ug/dL   Iron 42 27 - 139 ug/dL   Iron Saturation 14 (L) 15 - 55 %  Ferritin  Result Value Ref Range   Ferritin 35 15 - 150 ng/mL  TSH  Result Value Ref Range   TSH 0.244 (L) 0.450 - 4.500 uIU/mL      Assessment & Plan:   Problem List Items Addressed This Visit      Cardiovascular and Mediastinum   Essential hypertension    BP better today. Continue current regimen. Dizziness seems to be due to BPPV. Call with any concerns.        Genitourinary   Overactive bladder    Having urge incontinence, not over active bladder. Discussed that vesicare will not help with urge incontinence.        Other   Urge incontinence    Has failed kegals, wetting with standing, coughing, sneezing and laughing. Unable to void today- await results, treat as needed. Offered referral to urology- if UA clear, will refer.       Relevant Orders   UA/M w/rflx Culture, Routine    Other Visit Diagnoses    Benign paroxysmal positional vertigo due to bilateral vestibular disorder    -  Primary   Not doing well. Will get home health for PT. Homebound due to inablility to leave the house without a lot of effort. Order faxed to home heatlh call w/ concerns       Follow up plan: Return in about 4 weeks (around 09/29/2018).

## 2018-09-01 NOTE — Assessment & Plan Note (Addendum)
Has failed kegals, wetting with standing, coughing, sneezing and laughing. Unable to void today- await results, treat as needed. Offered referral to urology- if UA clear, will refer.

## 2018-09-01 NOTE — Assessment & Plan Note (Signed)
BP better today. Continue current regimen. Dizziness seems to be due to BPPV. Call with any concerns.

## 2018-09-01 NOTE — Assessment & Plan Note (Signed)
Having urge incontinence, not over active bladder. Discussed that vesicare will not help with urge incontinence.

## 2018-09-02 LAB — UA/M W/RFLX CULTURE, ROUTINE
Bilirubin, UA: NEGATIVE
Glucose, UA: NEGATIVE
Ketones, UA: NEGATIVE
Leukocytes, UA: NEGATIVE
Nitrite, UA: NEGATIVE
Protein, UA: NEGATIVE
RBC, UA: NEGATIVE
Specific Gravity, UA: 1.015 (ref 1.005–1.030)
Urobilinogen, Ur: 0.2 mg/dL (ref 0.2–1.0)
pH, UA: 7 (ref 5.0–7.5)

## 2018-09-05 DIAGNOSIS — I480 Paroxysmal atrial fibrillation: Secondary | ICD-10-CM | POA: Diagnosis not present

## 2018-09-05 DIAGNOSIS — E440 Moderate protein-calorie malnutrition: Secondary | ICD-10-CM | POA: Diagnosis not present

## 2018-09-05 DIAGNOSIS — C183 Malignant neoplasm of hepatic flexure: Secondary | ICD-10-CM | POA: Diagnosis not present

## 2018-09-05 DIAGNOSIS — D692 Other nonthrombocytopenic purpura: Secondary | ICD-10-CM | POA: Diagnosis not present

## 2018-09-05 DIAGNOSIS — I471 Supraventricular tachycardia: Secondary | ICD-10-CM | POA: Diagnosis not present

## 2018-09-05 DIAGNOSIS — Z7982 Long term (current) use of aspirin: Secondary | ICD-10-CM | POA: Diagnosis not present

## 2018-09-05 DIAGNOSIS — E038 Other specified hypothyroidism: Secondary | ICD-10-CM | POA: Diagnosis not present

## 2018-09-05 DIAGNOSIS — Z483 Aftercare following surgery for neoplasm: Secondary | ICD-10-CM | POA: Diagnosis not present

## 2018-09-05 DIAGNOSIS — I10 Essential (primary) hypertension: Secondary | ICD-10-CM | POA: Diagnosis not present

## 2018-09-05 DIAGNOSIS — J449 Chronic obstructive pulmonary disease, unspecified: Secondary | ICD-10-CM | POA: Diagnosis not present

## 2018-09-05 DIAGNOSIS — K921 Melena: Secondary | ICD-10-CM | POA: Diagnosis not present

## 2018-09-05 DIAGNOSIS — I2510 Atherosclerotic heart disease of native coronary artery without angina pectoris: Secondary | ICD-10-CM | POA: Diagnosis not present

## 2018-09-08 DIAGNOSIS — J449 Chronic obstructive pulmonary disease, unspecified: Secondary | ICD-10-CM | POA: Diagnosis not present

## 2018-09-08 DIAGNOSIS — J432 Centrilobular emphysema: Secondary | ICD-10-CM | POA: Diagnosis not present

## 2018-09-09 DIAGNOSIS — C183 Malignant neoplasm of hepatic flexure: Secondary | ICD-10-CM | POA: Diagnosis not present

## 2018-09-09 DIAGNOSIS — D692 Other nonthrombocytopenic purpura: Secondary | ICD-10-CM | POA: Diagnosis not present

## 2018-09-09 DIAGNOSIS — J449 Chronic obstructive pulmonary disease, unspecified: Secondary | ICD-10-CM | POA: Diagnosis not present

## 2018-09-09 DIAGNOSIS — I10 Essential (primary) hypertension: Secondary | ICD-10-CM | POA: Diagnosis not present

## 2018-09-09 DIAGNOSIS — I2510 Atherosclerotic heart disease of native coronary artery without angina pectoris: Secondary | ICD-10-CM | POA: Diagnosis not present

## 2018-09-09 DIAGNOSIS — K921 Melena: Secondary | ICD-10-CM | POA: Diagnosis not present

## 2018-09-09 DIAGNOSIS — Z483 Aftercare following surgery for neoplasm: Secondary | ICD-10-CM | POA: Diagnosis not present

## 2018-09-09 DIAGNOSIS — I471 Supraventricular tachycardia: Secondary | ICD-10-CM | POA: Diagnosis not present

## 2018-09-09 DIAGNOSIS — E038 Other specified hypothyroidism: Secondary | ICD-10-CM | POA: Diagnosis not present

## 2018-09-09 DIAGNOSIS — E440 Moderate protein-calorie malnutrition: Secondary | ICD-10-CM | POA: Diagnosis not present

## 2018-09-09 DIAGNOSIS — I480 Paroxysmal atrial fibrillation: Secondary | ICD-10-CM | POA: Diagnosis not present

## 2018-09-09 DIAGNOSIS — Z7982 Long term (current) use of aspirin: Secondary | ICD-10-CM | POA: Diagnosis not present

## 2018-09-30 ENCOUNTER — Ambulatory Visit (INDEPENDENT_AMBULATORY_CARE_PROVIDER_SITE_OTHER): Payer: Medicare Other | Admitting: Family Medicine

## 2018-09-30 ENCOUNTER — Encounter: Payer: Self-pay | Admitting: Family Medicine

## 2018-09-30 VITALS — BP 120/57 | HR 101 | Temp 97.6°F | Ht 60.0 in | Wt 93.0 lb

## 2018-09-30 DIAGNOSIS — N3941 Urge incontinence: Secondary | ICD-10-CM

## 2018-09-30 DIAGNOSIS — E440 Moderate protein-calorie malnutrition: Secondary | ICD-10-CM | POA: Diagnosis not present

## 2018-09-30 DIAGNOSIS — H8113 Benign paroxysmal vertigo, bilateral: Secondary | ICD-10-CM | POA: Diagnosis not present

## 2018-09-30 DIAGNOSIS — D692 Other nonthrombocytopenic purpura: Secondary | ICD-10-CM

## 2018-09-30 DIAGNOSIS — S81802S Unspecified open wound, left lower leg, sequela: Secondary | ICD-10-CM

## 2018-09-30 MED ORDER — SOLIFENACIN SUCCINATE 10 MG PO TABS: 10.0000 mg | ORAL_TABLET | Freq: Every day | ORAL | 3 refills | Status: DC

## 2018-09-30 NOTE — Assessment & Plan Note (Signed)
Healing slowly- If not closed in 1 month, will send to wound.

## 2018-09-30 NOTE — Assessment & Plan Note (Signed)
Weight stable. Continue ensure. Call with any concerns.

## 2018-09-30 NOTE — Progress Notes (Signed)
BP (!) 120/57   Pulse (!) 101   Temp 97.6 F (36.4 C) (Oral)   Ht 5' (1.524 m)   Wt 93 lb (42.2 kg)   SpO2 99%   BMI 18.16 kg/m    Subjective:    Patient ID: [name], female    DOB: 1928-01-26, 85 y.o.   MRN: [id number]  HPI: [name] is a 85 y.o. female  Chief Complaint  Patient presents with  . Follow-up    Vertigo is better. Wants "Pee pills"  . Leg Injury    Left leg. Bumped into table. Hurts.     Relevant past medical, surgical, family and social history reviewed and updated as indicated. Interim medical history since our last visit reviewed. Allergies and medications reviewed and updated.  Review of Systems  Constitutional: Negative.   Respiratory: Negative.   Cardiovascular: Negative.   Genitourinary: Positive for enuresis and urgency. Negative for decreased urine volume, difficulty urinating, dyspareunia, dysuria, flank pain, frequency, genital sores, hematuria, menstrual problem, pelvic pain, vaginal bleeding, vaginal discharge and vaginal pain.  Neurological: Negative.   Psychiatric/Behavioral: Negative.     Per HPI unless specifically indicated above     Objective:    BP (!) 120/57   Pulse (!) 101   Temp 97.6 F (36.4 C) (Oral)   Ht 5' (1.524 m)   Wt 93 lb (42.2 kg)   SpO2 99%   BMI 18.16 kg/m   Wt Readings from Last 3 Encounters:  09/30/18 93 lb (42.2 kg)  09/01/18 93 lb 6.4 oz (42.4 kg)  08/06/18 92 lb (41.7 kg)    Physical Exam Vitals signs and nursing note reviewed.  Constitutional:      General: She is not in acute distress.    Appearance: Normal appearance. She is not ill-appearing, toxic-appearing or diaphoretic.  HENT:     Head: Normocephalic and atraumatic.     Right Ear: External ear normal.     Left Ear: External ear normal.     Nose: Nose normal.     Mouth/Throat:     Mouth: Mucous membranes are moist.     Pharynx: Oropharynx is clear.  Eyes:     General: No scleral icterus.       Right eye: No  discharge.        Left eye: No discharge.     Extraocular Movements: Extraocular movements intact.     Conjunctiva/sclera: Conjunctivae normal.     Pupils: Pupils are equal, round, and reactive to light.  Neck:     Musculoskeletal: Normal range of motion and neck supple.  Cardiovascular:     Rate and Rhythm: Normal rate and regular rhythm.     Pulses: Normal pulses.     Heart sounds: Normal heart sounds. No murmur. No friction rub. No gallop.   Pulmonary:     Effort: Pulmonary effort is normal. No respiratory distress.     Breath sounds: Normal breath sounds. No stridor. No wheezing, rhonchi or rales.  Chest:     Chest wall: No tenderness.  Musculoskeletal: Normal range of motion.  Skin:    General: Skin is warm and dry.     Capillary Refill: Capillary refill takes less than 2 seconds.     Coloration: Skin is not jaundiced or pale.     Findings: No bruising, erythema, lesion or rash.     Comments: 1 inch wound of L anterior shin, healing well   Neurological:     General: No focal  deficit present.     Mental Status: She is alert and oriented to person, place, and time. Mental status is at baseline.  Psychiatric:        Mood and Affect: Mood normal.        Behavior: Behavior normal.        Thought Content: Thought content normal.        Judgment: Judgment normal.     Results for orders placed or performed in visit on 09/01/18  UA/M w/rflx Culture, Routine  Result Value Ref Range   Specific Gravity, UA 1.015 1.005 - 1.030   pH, UA 7.0 5.0 - 7.5   Color, UA Yellow Yellow   Appearance Ur Clear Clear   Leukocytes, UA Negative Negative   Protein, UA Negative Negative/Trace   Glucose, UA Negative Negative   Ketones, UA Negative Negative   RBC, UA Negative Negative   Bilirubin, UA Negative Negative   Urobilinogen, Ur 0.2 0.2 - 1.0 mg/dL   Nitrite, UA Negative Negative      Assessment & Plan:   Problem List Items Addressed This Visit      Cardiovascular and Mediastinum     Senile purpura ([place])    Reassured patient. Call with any concerns.         Other   Open wound of left lower leg    Healing slowly- If not closed in 1 month, will send to wound.       Protein-calorie malnutrition ([place])    Weight stable. Continue ensure. Call with any concerns.       Urge incontinence - Primary    Discussed again with patient that vesicare will not help urge incontinence, it is for overactive bladder. She insists that it did previously. Will give her 1 month on the vesicare, if still leaking- will refer to urology.      Relevant Medications   solifenacin (VESICARE) 10 MG tablet    Other Visit Diagnoses    Benign paroxysmal positional vertigo due to bilateral vestibular disorder       Resolved.        Follow up plan: Return in about 3 months (around 12/30/2018).

## 2018-09-30 NOTE — Assessment & Plan Note (Signed)
Reassured patient. Call with any concerns.  

## 2018-09-30 NOTE — Assessment & Plan Note (Signed)
Discussed again with patient that vesicare will not help urge incontinence, it is for overactive bladder. She insists that it did previously. Will give her 1 month on the vesicare, if still leaking- will refer to urology.

## 2018-10-09 DIAGNOSIS — J432 Centrilobular emphysema: Secondary | ICD-10-CM | POA: Diagnosis not present

## 2018-10-09 DIAGNOSIS — J449 Chronic obstructive pulmonary disease, unspecified: Secondary | ICD-10-CM | POA: Diagnosis not present

## 2018-10-20 DIAGNOSIS — H401131 Primary open-angle glaucoma, bilateral, mild stage: Secondary | ICD-10-CM | POA: Diagnosis not present

## 2018-11-04 ENCOUNTER — Encounter: Payer: Medicare Other | Attending: Physician Assistant | Admitting: Physician Assistant

## 2018-11-04 DIAGNOSIS — L97929 Non-pressure chronic ulcer of unspecified part of left lower leg with unspecified severity: Secondary | ICD-10-CM | POA: Diagnosis not present

## 2018-11-04 DIAGNOSIS — I10 Essential (primary) hypertension: Secondary | ICD-10-CM | POA: Diagnosis not present

## 2018-11-04 DIAGNOSIS — L97821 Non-pressure chronic ulcer of other part of left lower leg limited to breakdown of skin: Secondary | ICD-10-CM | POA: Diagnosis not present

## 2018-11-04 DIAGNOSIS — J449 Chronic obstructive pulmonary disease, unspecified: Secondary | ICD-10-CM | POA: Diagnosis not present

## 2018-11-05 NOTE — Progress Notes (Signed)
SONNET, RIZOR ([id number]) Visit Report for 11/04/2018 Abuse/Suicide Risk Screen Details Patient Name: [name], [name]. Date of Service: 11/04/2018 2:45 PM Medical Record Number: [id number] Patient Account Number: [id number] Date of Birth/Sex: Dec 01, 1927 (85 y.o. F) Treating RN: [name] Primary Care Nnaemeka Samson: Park Liter Other Clinician: Referring Kerria Sapien: Referral, Self Treating Benuel Ly/Extender: STONE III, [name] Weeks in Treatment: 0 Abuse/Suicide Risk Screen Items Answer ABUSE/SUICIDE RISK SCREEN: Has anyone close to you tried to hurt or harm you recentlyo No Do you feel uncomfortable with anyone in your familyo No Has anyone forced you do things that you didnot want to doo No Do you have any thoughts of harming yourselfo No Patient displays signs or symptoms of abuse and/or neglect. No Electronic Signature(s) Signed: 11/04/2018 4:08:43 PM By: [name] Entered By: [name] on 11/04/2018 15:20:40 Fitzgibbon, Riham HMarland Kitchen ([id number]) -------------------------------------------------------------------------------- Activities of Daily Living Details Patient Name: [name], [name] Date of Service: 11/04/2018 2:45 PM Medical Record Number: [id number] Patient Account Number: [id number] Date of Birth/Sex: 1928/04/18 (85 y.o. F) Treating RN: [name] Primary Care Vinicius Brockman: Park Liter Other Clinician: Referring Calyx Hawker: Referral, Self Treating Koren Sermersheim/Extender: STONE III, [name] Weeks in Treatment: 0 Activities of Daily Living Items Answer Activities of Daily Living (Please select one for each item) Drive Automobile Completely Able Take Medications Completely Able Use Telephone Completely Able Care for Appearance Completely Able Use Toilet Completely Able Bath / Shower Completely Able Dress Self Completely Able Feed Self Completely Able Walk Completely Able Get In / Out Bed Completely Able Housework Completely Able Prepare Meals Completely  [place] for Self Completely Able Electronic Signature(s) Signed: 11/04/2018 4:08:43 PM By: [name] Entered By: [name] on 11/04/2018 15:21:02 Knickerbocker, [name] ([id number]) -------------------------------------------------------------------------------- Education Assessment Details Patient Name: Carol Berger H. Date of Service: 11/04/2018 2:45 PM Medical Record Number: [id number] Patient Account Number: [id number] Date of Birth/Sex: 03-27-1928 (85 y.o. F) Treating RN: [name] Primary Care Parley Pidcock: Park Liter Other Clinician: Referring Jerrel Tiberio: Referral, Self Treating Karrin Eisenmenger/Extender: [name], [name] Weeks in Treatment: 0 Primary Learner Assessed: Patient Learning Preferences/Education Level/Primary Language Learning Preference: Explanation, Demonstration Highest Education Level: High School Preferred Language: English Cognitive Barrier Assessment/Beliefs Language Barrier: No Translator Needed: No Memory Deficit: No Emotional Barrier: No Cultural/Religious Beliefs Affecting Medical Care: No Physical Barrier Assessment Impaired Vision: No Impaired Hearing: No Decreased Hand dexterity: No Knowledge/Comprehension Assessment Knowledge Level: High Comprehension Level: High Ability to understand written High instructions: Ability to understand verbal High instructions: Motivation Assessment Anxiety Level: Calm Cooperation: Cooperative Education Importance: Acknowledges Need Interest in Health Problems: Asks Questions Perception: Coherent Willingness to Engage in Self- High Management Activities: Readiness to Engage in Self- High Management Activities: Electronic Signature(s) Signed: 11/04/2018 4:08:43 PM By: [name] Entered By: [name] on 11/04/2018 15:21:27 LUV, MISH ([id number]) -------------------------------------------------------------------------------- Fall Risk Assessment  Details Patient Name: [name], [name] Date of Service: 11/04/2018 2:45 PM Medical Record Number: [id number] Patient Account Number: [id number] Date of Birth/Sex: 04/12/1928 (85 y.o. F) Treating RN: [name] Primary Care Fransisca Shawn: Park Liter Other Clinician: Referring Mohd. Derflinger: Referral, Self Treating Carianna Lague/Extender: [name], [name] Weeks in Treatment: 0 Fall Risk Assessment Items Have you had 2 or more falls in the last 12 monthso 0 No Have you had any fall that resulted in injury in the last 12 monthso 0 No FALL RISK ASSESSMENT: History of falling - immediate or within 3 months 0 No Secondary diagnosis 0 No Ambulatory aid None/bed rest/wheelchair/nurse 0 No Crutches/cane/walker 0 No Furniture 0  No IV Access/Saline Lock 0 No Gait/Training Normal/bed rest/immobile 0 No Weak 0 No Impaired 0 No Mental Status Oriented to own ability 0 No Electronic Signature(s) Signed: 11/04/2018 4:08:43 PM By: [name] Entered By: [name] on 11/04/2018 15:21:42 Yerby, Keigan H. ([id number]) -------------------------------------------------------------------------------- Foot Assessment Details Patient Name: [name], [name] Date of Service: 11/04/2018 2:45 PM Medical Record Number: [id number] Patient Account Number: [id number] Date of Birth/Sex: July 08, 1928 (85 y.o. F) Treating RN: [name] Primary Care Rochel Privett: Park Liter Other Clinician: Referring Aurelio Mccamy: Referral, Self Treating Tarik Teixeira/Extender: STONE III, [name] Weeks in Treatment: 0 Foot Assessment Items Site Locations + = Sensation present, - = Sensation absent, C = Callus, U = Ulcer R = Redness, W = Warmth, M = Maceration, PU = Pre-ulcerative lesion F = Fissure, S = Swelling, D = Dryness Assessment Right: Left: Other Deformity: No No Prior Foot Ulcer: No No Prior Amputation: No No Charcot Joint: No No Ambulatory Status: Ambulatory Without Help Gait: Steady Electronic Signature(s) Signed:  11/04/2018 4:08:43 PM By: [name] Entered By: [name] on 11/04/2018 15:23:08 [place], [name] ([id number]) -------------------------------------------------------------------------------- Nutrition Risk Assessment Details Patient Name: [name], [name] Date of Service: 11/04/2018 2:45 PM Medical Record Number: [id number] Patient Account Number: [id number] Date of Birth/Sex: [birth year] (85 y.o. F) Treating RN: [name] Primary Care Aamna Mallozzi: Park Liter Other Clinician: Referring Janna Oak: Referral, Self Treating Kolette Vey/Extender: STONE III, [name] Weeks in Treatment: 0 Height (in): 62 Weight (lbs): 92 Body Mass Index (BMI): 16.8 Nutrition Risk Assessment Items NUTRITION RISK SCREEN: I have an illness or condition that made me change the kind and/or amount of 0 No food I eat I eat fewer than two meals per day 0 No I eat few fruits and vegetables, or milk products 0 No I have three or more drinks of beer, liquor or wine almost every day 0 No I have tooth or mouth problems that make it hard for me to eat 0 No I don't always have enough money to buy the food I need 0 No I eat alone most of the time 0 No I take three or more different prescribed or over-the-counter drugs a day 0 No Without wanting to, I have lost or gained 10 pounds in the last six months 0 No I am not always physically able to shop, cook and/or feed myself 0 No Nutrition Protocols Good Risk Protocol Moderate Risk Protocol Electronic Signature(s) Signed: 11/04/2018 4:08:43 PM By: [name] Entered By: [name] on 11/04/2018 15:22:13

## 2018-11-07 DIAGNOSIS — J449 Chronic obstructive pulmonary disease, unspecified: Secondary | ICD-10-CM | POA: Diagnosis not present

## 2018-11-07 DIAGNOSIS — J432 Centrilobular emphysema: Secondary | ICD-10-CM | POA: Diagnosis not present

## 2018-11-07 NOTE — Progress Notes (Signed)
[name] ([id number]) Visit Report for 11/04/2018 Chief Complaint Document Details Patient Name: [name]. Date of Service: 11/04/2018 2:45 PM Medical Record Number: [id number] Patient Account Number: [id number] Date of Birth/Sex: 1927/09/25 (85 y.o. F) Treating RN: Montey Hora Primary Care Provider: Park Liter Other Clinician: Referring Provider: Referral, Self Treating Provider/Extender: [name], [name] Weeks in Treatment: 0 Information Obtained from: Patient Chief Complaint Left leg ulcer Electronic Signature(s) Signed: 11/05/2018 11:58:16 PM By: Worthy Keeler PA-C Entered By: Worthy Keeler on 11/04/2018 15:57:23 [name], [name] ([id number]) -------------------------------------------------------------------------------- HPI Details Patient Name: [name]. Date of Service: 11/04/2018 2:45 PM Medical Record Number: [id number] Patient Account Number: [id number] Date of Birth/Sex: Feb 11, 1928 (85 y.o. F) Treating RN: Montey Hora Primary Care Provider: Park Liter Other Clinician: Referring Provider: Referral, Self Treating Provider/Extender: [name], [name] Weeks in Treatment: 0 History of Present Illness HPI Description: 02/12/18 ADMISSION This is an 85 year old woman who is recently moved to [place] from [place]. Her story began in February where she fell on some ice suffering extensive lacerations to her bilateral lower extremities. She is able to show me extensive pictures of the right lower leg but with going to a wound care center and [place] 3 times a week these eventually closed over. She has been left with 1 area on the upper lateral left calf. She has been applying Neosporin to this and applying a bandage. Currently this measures 2 x 1.5 cm. The patient is not a diabetic. She is an ex-smoker quitting 40 years ago. She does have COPD by description. ABIs in our clinic were 1.32 on the right and 1.03 on the left 02/19/18;  right lower leg wound which was initially trauma. The area that we look that last week is smaller. Still covered in a nonviable surface however. We are using Iodoflex 02/26/18; right lower leg wound which was initially trauma in the setting of chronic venous insufficiency. Surface of the wound looks much better healthy granulation advancing epithelialization. We have good edema control 03/05/18; right lower leg wound which was initially trauma in the setting of chronic venous insufficiency. She continues to make nice progress here. We have good edema control oHe arrives today with a new traumatic laceration on her right dorsal forearm which she states happened while she was putting on a sweatshirt 03/12/18; right lower leg wound as closed however it still looks vulnerable. She has chronic venous insufficiency. oThe new wound from last week a traumatic area on her right dorsal or arm unfortunately does not have a viable surface. I remove some nonviable skin and necrotic subcutaneous debris from the wound surface. Hemostasis with silver nitrate and direct pressure 03/19/18; right lower leg wound is closed. She has chronic venous insufficiency and will need ongoing compression oThe new wounds from 2 weeks ago on the right upper elbow is closed. The area on her right dorsal forearm continues to have a nonviable surface requiring debridement 03/26/18; we'll need to look into ongoing compression for her legs. 2 wounds on the right upper elbow is closed. The area on the right dorsal forearm looks better. Hydrofera Blue secondary to hypertrophic granulation 04/02/18; he has compression stockings although she did not wear them today. Severe chronic venous insufficiency. She has 2 wounds on the right upper elbow which I said were closed last week which actually are although they're very small and she has a smaller clean wound on the right dorsal forearm. We've been using Hydrofera Blue secondary to some  upper  granulation. All of this looks better 04/09/18-She is seen in follow up evaluation for a right elbow and right forearm skin tear. The right elbow is healed. We will continue with same treatment plan and she'll follow next week 04/16/18-She is seen in follow-up evaluation for right forearm skin tear, this is essentially healed. We will cover with foam border and she will follow-up next week 04/23/18; the patient arrives with a new skin tear on her right dorsal arm just below the elbow. She got this while carrying a box. She has a linear skin tear. There is no depth I don't think this should've been sutured. There is a skin flap medially I'm not sure if this will be viable or not 04/30/18; the new skin tear from last week unfortunately has remained viable in terms of the skin flap. She has a small open area that looks healthy. Using moistened silver collagen 05/07/18; right dorsal arm skin tear. Nonviable tissue over the remnants of the wound. We've been using silver collagen 05/21/18; right dorsal arm skin tear. Nonviable tissue over a small remnant of the wound was washed off. We've been using silver collagen which we will continue 06/04/18; the right dorsal arm skin tear has healed. She arrives today with a traumatic wound just above the olecranon of her left elbow. This is a skin tear with a nonviable nonadherent flap which was removed. We'll use silver collagen here. Also noteworthy she arrives without her compression stockings 06/18/18; the area just above the olecranon of her left elbow. It is smaller and generally has a healthy surface. I was surprised [name]. ([id number]) to learn that the patient is actually changing this herself although. Appears this week she was putting some topical lidocaine on this that she bought over-the-counter at the drugstore. She did get supplies at home she didn't know how to put it on. She drove herself here today i.e. not eligible for home  health Readmission: 11/04/18 on evaluation today patient presents for evaluation our clinic news to include the she has on her lower extremity which occurred as the result of having hit this on a piece of furniture. This was back in December. She states that her daughter has been trying to get her to come into have this evaluated here the wound care center but she was being somewhat stubborn. Nonetheless upon evaluation today the wound really appears to be potentially healed she does have some swelling of lower extremity which I think will come into play. With that being said he tells me that even yesterday this was still making clear fluid and therefore there may be some issues with continued problems with the swelling and drainage secondary to her venous stasis. Nonetheless overall wound appears to be doing fairly well which is good news. Electronic Signature(s) Signed: 11/05/2018 11:58:16 PM By: Worthy Keeler PA-C Entered By: Worthy Keeler on 11/05/2018 23:53:07 NOCOLE, ZAMMIT ([id number]) -------------------------------------------------------------------------------- Physical Exam Details Patient Name: Wise, [name] Date of Service: 11/04/2018 2:45 PM Medical Record Number: [id number] Patient Account Number: [id number] Date of Birth/Sex: 02-Mar-1928 (85 y.o. F) Treating RN: Montey Hora Primary Care Provider: Park Liter Other Clinician: Referring Provider: Referral, Self Treating Provider/Extender: STONE III, [name] Weeks in Treatment: 0 Constitutional sitting or standing blood pressure is within target range for patient.. pulse regular and within target range for patient.Marland Kitchen respirations regular, non-labored and within target range for patient.Marland Kitchen temperature within target range for patient.. Well- nourished and well-hydrated in no acute distress. Eyes conjunctiva clear  no eyelid edema noted. pupils equal round and reactive to light and accommodation. Ears, Nose, Mouth,  and Throat no gross abnormality of ear auricles or external auditory canals. normal hearing noted during conversation. mucus membranes moist. Respiratory normal breathing without difficulty. clear to auscultation bilaterally. Cardiovascular regular rate and rhythm with normal S1, S2. 2+ dorsalis pedis/posterior tibialis pulses. no clubbing, cyanosis, significant edema, <3 sec cap refill. Gastrointestinal (GI) soft, non-tender, non-distended, +BS. no ventral hernia noted. Musculoskeletal normal gait and posture. no significant deformity or arthritic changes, no loss or range of motion, no clubbing. Psychiatric this patient is able to make decisions and demonstrates good insight into disease process. Alert and Oriented x 3. pleasant and cooperative. Notes Patient's wound bed currently shows evidence of good healing and epithelialization at this point. The best not appear to be signs of infection and that I do not see any specific evidence of drainage at this point from the wound is definitely possible that this has been drinking even yesterday. For that reason I did explain to the patient we can definitely manage this as if it is still an open wound at this point. Electronic Signature(s) Signed: 11/05/2018 11:58:16 PM By: Worthy Keeler PA-C Entered By: Worthy Keeler on 11/05/2018 23:53:49 DAMYAH, GUGEL ([id number]) -------------------------------------------------------------------------------- Physician Orders Details Patient Name: [name]. Date of Service: 11/04/2018 2:45 PM Medical Record Number: [id number] Patient Account Number: [id number] Date of Birth/Sex: 1928-03-22 (85 y.o. F) Treating RN: Montey Hora Primary Care Provider: Park Liter Other Clinician: Referring Provider: Referral, Self Treating Provider/Extender: [name], [name] Weeks in Treatment: 0 Verbal / Phone Orders: No Diagnosis Coding ICD-10 Coding Code Description I87.2 Venous  insufficiency (chronic) (peripheral) S81.802A Unspecified open wound, left lower leg, initial encounter Wound Cleansing Wound #6 Left,Anterior Lower Leg o Clean wound with Normal Saline. o May shower with protection. - PLEASE DO NOT GET YOUR WRAP WET. YOU MAY GET A CAST PROTECTOR FROM A LOCAL PHARMACY TO KEEP YOUR WRAP DRY DURING BATHING Primary Wound Dressing Wound #6 Left,Anterior Lower Leg o Non-adherent pad Dressing Change Frequency Wound #6 Left,Anterior Lower Leg o Change dressing every week Follow-up Appointments Wound #6 Left,Anterior Lower Leg o Return Appointment in 1 week. Edema Control Wound #6 Left,Anterior Lower Leg o 3 Layer Compression System - Left Lower Extremity Electronic Signature(s) Signed: 11/04/2018 5:24:46 PM By: Montey Hora Signed: 11/05/2018 11:58:16 PM By: Worthy Keeler PA-C Entered By: Montey Hora on 11/04/2018 16:04:42 Ghrist, Breana H. ([id number]) -------------------------------------------------------------------------------- Problem List Details Patient Name: Wise, [name] Date of Service: 11/04/2018 2:45 PM Medical Record Number: [id number] Patient Account Number: [id number] Date of Birth/Sex: Sep 27, 1927 (85 y.o. F) Treating RN: Montey Hora Primary Care Provider: Park Liter Other Clinician: Referring Provider: Referral, Self Treating Provider/Extender: [name], [name] Weeks in Treatment: 0 Active Problems ICD-10 Evaluated Encounter Code Description Active Date Today Diagnosis I87.2 Venous insufficiency (chronic) (peripheral) 11/04/2018 No Yes S81.802A Unspecified open wound, left lower leg, initial encounter 11/04/2018 No Yes Inactive Problems Resolved Problems Electronic Signature(s) Signed: 11/05/2018 11:58:16 PM By: Worthy Keeler PA-C Entered By: Worthy Keeler on 11/04/2018 15:56:38 Lepak, Kazaria H. ([id number]) -------------------------------------------------------------------------------- Progress  Note Details Patient Name: Wise, [name] Date of Service: 11/04/2018 2:45 PM Medical Record Number: [id number] Patient Account Number: [id number] Date of Birth/Sex: 1928/03/25 (85 y.o. F) Treating RN: Montey Hora Primary Care Provider: Park Liter Other Clinician: Referring Provider: Referral, Self Treating Provider/Extender: [name], [name] Weeks in Treatment: 0 Subjective Chief Complaint Information obtained from Patient Left  leg ulcer History of Present Illness (HPI) 02/12/18 ADMISSION This is an 85 year old woman who is recently moved to [place] from [place]. Her story began in February where she fell on some ice suffering extensive lacerations to her bilateral lower extremities. She is able to show me extensive pictures of the right lower leg but with going to a wound care center and [place] 3 times a week these eventually closed over. She has been left with 1 area on the upper lateral left calf. She has been applying Neosporin to this and applying a bandage. Currently this measures 2 x 1.5 cm. The patient is not a diabetic. She is an ex-smoker quitting 40 years ago. She does have COPD by description. ABIs in our clinic were 1.32 on the right and 1.03 on the left 02/19/18; right lower leg wound which was initially trauma. The area that we look that last week is smaller. Still covered in a nonviable surface however. We are using Iodoflex 02/26/18; right lower leg wound which was initially trauma in the setting of chronic venous insufficiency. Surface of the wound looks much better healthy granulation advancing epithelialization. We have good edema control 03/05/18; right lower leg wound which was initially trauma in the setting of chronic venous insufficiency. She continues to make nice progress here. We have good edema control He arrives today with a new traumatic laceration on her right dorsal forearm which she states happened while she was putting on a  sweatshirt 03/12/18; right lower leg wound as closed however it still looks vulnerable. She has chronic venous insufficiency. The new wound from last week a traumatic area on her right dorsal or arm unfortunately does not have a viable surface. I remove some nonviable skin and necrotic subcutaneous debris from the wound surface. Hemostasis with silver nitrate and direct pressure 03/19/18; right lower leg wound is closed. She has chronic venous insufficiency and will need ongoing compression The new wounds from 2 weeks ago on the right upper elbow is closed. The area on her right dorsal forearm continues to have a nonviable surface requiring debridement 03/26/18; we'll need to look into ongoing compression for her legs. 2 wounds on the right upper elbow is closed. The area on the right dorsal forearm looks better. Hydrofera Blue secondary to hypertrophic granulation 04/02/18; he has compression stockings although she did not wear them today. Severe chronic venous insufficiency. She has 2 wounds on the right upper elbow which I said were closed last week which actually are although they're very small and she has a smaller clean wound on the right dorsal forearm. We've been using Hydrofera Blue secondary to some upper granulation. All of this looks better 04/09/18-She is seen in follow up evaluation for a right elbow and right forearm skin tear. The right elbow is healed. We will continue with same treatment plan and she'll follow next week 04/16/18-She is seen in follow-up evaluation for right forearm skin tear, this is essentially healed. We will cover with foam border and she will follow-up next week 04/23/18; the patient arrives with a new skin tear on her right dorsal arm just below the elbow. She got this while carrying a box. She has a linear skin tear. There is no depth I don't think this should've been sutured. There is a skin flap medially I'm not sure if this will be viable or not 04/30/18; the  new skin tear from last week unfortunately has remained viable in terms of the skin flap. She has a small  open area that looks healthy. Using moistened silver collagen 05/07/18; right dorsal arm skin tear. Nonviable tissue over the remnants of the wound. We've been using silver collagen DEVON, KINGDON ([id number]) 05/21/18; right dorsal arm skin tear. Nonviable tissue over a small remnant of the wound was washed off. We've been using silver collagen which we will continue 06/04/18; the right dorsal arm skin tear has healed. She arrives today with a traumatic wound just above the olecranon of her left elbow. This is a skin tear with a nonviable nonadherent flap which was removed. We'll use silver collagen here. Also noteworthy she arrives without her compression stockings 06/18/18; the area just above the olecranon of her left elbow. It is smaller and generally has a healthy surface. I was surprised to learn that the patient is actually changing this herself although. Appears this week she was putting some topical lidocaine on this that she bought over-the-counter at the drugstore. She did get supplies at home she didn't know how to put it on. She drove herself here today i.e. not eligible for home health Readmission: 11/04/18 on evaluation today patient presents for evaluation our clinic news to include the she has on her lower extremity which occurred as the result of having hit this on a piece of furniture. This was back in December. She states that her daughter has been trying to get her to come into have this evaluated here the wound care center but she was being somewhat stubborn. Nonetheless upon evaluation today the wound really appears to be potentially healed she does have some swelling of lower extremity which I think will come into play. With that being said he tells me that even yesterday this was still making clear fluid and therefore there may be some issues with continued problems  with the swelling and drainage secondary to her venous stasis. Nonetheless overall wound appears to be doing fairly well which is good news. Wound History Patient presents with 1 open wound that has been present for approximately 3 months. Patient has been treating wound in the following manner: neosporin. Laboratory tests have not been performed in the last month. Patient reportedly has not tested positive for an antibiotic resistant organism. Patient reportedly has not tested positive for osteomyelitis. Patient reportedly has not had testing performed to evaluate circulation in the legs. Patient History Information obtained from Patient. Allergies No Known Allergies Family History Cancer - Siblings,Paternal Grandparents, Diabetes - Siblings, Heart Disease - Mother,Father,Child,Paternal Grandparents, Hypertension - Child, No family history of Hereditary Spherocytosis, Kidney Disease, Lung Disease, Seizures, Stroke, Thyroid Problems, Tuberculosis. Social History Former smoker - quit 40 yrs ago, Marital Status - Divorced, Alcohol Use - Never, Drug Use - No History, Caffeine Use - Rarely. Medical History Eyes Patient has history of Cataracts - surgery 2000, Glaucoma Respiratory Patient has history of Asthma, Chronic Obstructive Pulmonary Disease (COPD) Denies history of Pneumothorax, Sleep Apnea, Tuberculosis Cardiovascular Patient has history of Arrhythmia - a-fib, Hypertension Denies history of Angina, Congestive Heart Failure, Coronary Artery Disease, Deep Vein Thrombosis, Hypotension, Myocardial Infarction, Peripheral Arterial Disease, Peripheral Venous Disease, Phlebitis, Vasculitis Endocrine Denies history of Type I Diabetes, Type II Diabetes Genitourinary Denies history of End Stage Renal Disease Immunological Denies history of Lupus Erythematosus, Raynaud s, Scleroderma [name], [name] ([id number]) Integumentary (Skin) Denies history of History of Burn, History of  pressure wounds Musculoskeletal Denies history of Gout, Rheumatoid Arthritis, Osteoarthritis, Osteomyelitis Neurologic Denies history of Dementia, Neuropathy, Quadriplegia, Paraplegia, Seizure Disorder Oncologic Denies history of Received Chemotherapy, Received  Radiation Psychiatric Denies history of Anorexia/bulimia, Confinement Anxiety Hospitalization/Surgery History - 06/26/2018, [facility], Colon cancer. Review of Systems (ROS) Respiratory Complains or has symptoms of Shortness of Breath - wears oxygen at home. Denies complaints or symptoms of Chronic or frequent coughs. Cardiovascular Denies complaints or symptoms of Chest pain, LE edema. Endocrine Denies complaints or symptoms of Hepatitis, Thyroid disease, Polydypsia (Excessive Thirst). Genitourinary Complains or has symptoms of Incontinence/dribbling - leaking bladder. Denies complaints or symptoms of Kidney failure/ Dialysis. Immunological Denies complaints or symptoms of Itching. Integumentary (Skin) Complains or has symptoms of Wounds - abrasion to left lower leg. Denies complaints or symptoms of Bleeding or bruising tendency, Breakdown, Swelling. Musculoskeletal Denies complaints or symptoms of Muscle Pain, Muscle Weakness. Neurologic Denies complaints or symptoms of Numbness/parasthesias, Focal/Weakness. Oncologic The patient has no complaints or symptoms. Psychiatric Complains or has symptoms of Anxiety. Denies complaints or symptoms of Claustrophobia. Objective Constitutional sitting or standing blood pressure is within target range for patient.. pulse regular and within target range for patient.Marland Kitchen respirations regular, non-labored and within target range for patient.Marland Kitchen temperature within target range for patient.. Well- nourished and well-hydrated in no acute distress. Vitals Time Taken: 3:11 PM, Height: 62 in, Source: Stated, Weight: 92 lbs, Source: Stated, BMI: 16.8, Temperature: 97.8 F, Pulse: 62  bpm, Respiratory Rate: 16 breaths/min, Blood Pressure: 104/80 mmHg. Eyes Wise, [name] ([id number]) conjunctiva clear no eyelid edema noted. pupils equal round and reactive to light and accommodation. Ears, Nose, Mouth, and Throat no gross abnormality of ear auricles or external auditory canals. normal hearing noted during conversation. mucus membranes moist. Respiratory normal breathing without difficulty. clear to auscultation bilaterally. Cardiovascular regular rate and rhythm with normal S1, S2. 2+ dorsalis pedis/posterior tibialis pulses. no clubbing, cyanosis, significant edema, Gastrointestinal (GI) soft, non-tender, non-distended, +BS. no ventral hernia noted. Musculoskeletal normal gait and posture. no significant deformity or arthritic changes, no loss or range of motion, no clubbing. Psychiatric this patient is able to make decisions and demonstrates good insight into disease process. Alert and Oriented x 3. pleasant and cooperative. General Notes: Patient's wound bed currently shows evidence of good healing and epithelialization at this point. The best not appear to be signs of infection and that I do not see any specific evidence of drainage at this point from the wound is definitely possible that this has been drinking even yesterday. For that reason I did explain to the patient we can definitely manage this as if it is still an open wound at this point. Integumentary (Hair, Skin) Wound #6 status is Open. Original cause of wound was Trauma. The wound is located on the Left,Anterior Lower Leg. The wound measures 0.1cm length x 0.1cm width x 0.1cm depth; 0.008cm^2 area and 0.001cm^3 volume. The wound is limited to skin breakdown. There is no tunneling or undermining noted. There is a none present amount of drainage noted. The wound margin is indistinct and nonvisible. There is no granulation within the wound bed. There is no necrotic tissue within the wound bed. The  periwound skin appearance exhibited: Scarring, Dry/Scaly. The periwound skin appearance did not exhibit: Callus, Crepitus, Excoriation, Induration, Rash, Maceration, Atrophie Blanche, Cyanosis, Ecchymosis, Hemosiderin Staining, Mottled, Pallor, Rubor, Erythema. Assessment Active Problems ICD-10 Venous insufficiency (chronic) (peripheral) Unspecified open wound, left lower leg, initial encounter Procedures Wound #6 Pre-procedure diagnosis of Wound #6 is a Trauma, Other located on the Left,Anterior Lower Leg . There was a Three Layer Compression Therapy Procedure with a pre-treatment ABI of 1.2 by Montey Hora, RN.  [name], [name] ([id number]) Post procedure Diagnosis Wound #6: Same as Pre-Procedure Plan Wound Cleansing: Wound #6 Left,Anterior Lower Leg: Clean wound with Normal Saline. May shower with protection. - PLEASE DO NOT GET YOUR WRAP WET. YOU MAY GET A CAST PROTECTOR FROM A LOCAL PHARMACY TO KEEP YOUR WRAP DRY DURING BATHING Primary Wound Dressing: Wound #6 Left,Anterior Lower Leg: Non-adherent pad Dressing Change Frequency: Wound #6 Left,Anterior Lower Leg: Change dressing every week Follow-up Appointments: Wound #6 Left,Anterior Lower Leg: Return Appointment in 1 week. Edema Control: Wound #6 Left,Anterior Lower Leg: 3 Layer Compression System - Left Lower Extremity My suggestion currently is gonna be to go ahead and initiate the above wound care measures with the compression wrap and the patient is in agreement this plan. This would be a three layer compression wrap we will utilize. Subsequently I'll see her back for reevaluation in one weeks time will see if there's any drainage issues at that point. My hope is that getting the fluid on the lower extremity will allow this area to stay close and completely healed without any complications. She's in agreement that plan. We will otherwise see were things stand at follow-up. Please see above for specific wound care  orders. We will see patient for re-evaluation in 1 week(s) here in the clinic. If anything worsens or changes patient will contact our office for additional recommendations. Electronic Signature(s) Signed: 11/05/2018 11:58:16 PM By: Worthy Keeler PA-C Entered By: Worthy Keeler on 11/05/2018 23:54:06 [name], [name] ([id number]) -------------------------------------------------------------------------------- ROS/PFSH Details Patient Name: [name]. Date of Service: 11/04/2018 2:45 PM Medical Record Number: [id number] Patient Account Number: [id number] Date of Birth/Sex: [birth year] (85 y.o. F) Treating RN: [name] Primary Care Provider: Park Liter Other Clinician: Referring Provider: Referral, Self Treating Provider/Extender: [name], [name] Weeks in Treatment: 0 Information Obtained From Patient Wound History Do you currently have one or more open woundso Yes How many open wounds do you currently haveo 1 Approximately how long have you had your woundso 3 months How have you been treating your wound(s) until nowo neosporin Has your wound(s) ever healed and then re-openedo No Have you had any lab work done in the past montho No Have you tested positive for an antibiotic resistant organism (MRSA, VRE)o No Have you tested positive for osteomyelitis (bone infection)o No Have you had any tests for circulation on your legso No Respiratory Complaints and Symptoms: Positive for: Shortness of Breath - wears oxygen at home Negative for: Chronic or frequent coughs Medical History: Positive for: Asthma; Chronic Obstructive Pulmonary Disease (COPD) Negative for: Pneumothorax; Sleep Apnea; Tuberculosis Cardiovascular Complaints and Symptoms: Negative for: Chest pain; LE edema Medical History: Positive for: Arrhythmia - a-fib; Hypertension Negative for: Angina; Congestive Heart Failure; Coronary Artery Disease; Deep Vein Thrombosis; Hypotension; Myocardial Infarction;  Peripheral Arterial Disease; Peripheral Venous Disease; Phlebitis; Vasculitis Endocrine Complaints and Symptoms: Negative for: Hepatitis; Thyroid disease; Polydypsia (Excessive Thirst) Medical History: Negative for: Type I Diabetes; Type II Diabetes Genitourinary Complaints and Symptoms: Positive for: Incontinence/dribbling - leaking bladder Negative for: Kidney failure/ Dialysis Medical HistoryDALLIS, [name] ([id number]) Negative for: End Stage Renal Disease Immunological Complaints and Symptoms: Negative for: Itching Medical History: Negative for: Lupus Erythematosus; Raynaudos; Scleroderma Integumentary (Skin) Complaints and Symptoms: Positive for: Wounds - abrasion to left lower leg Negative for: Bleeding or bruising tendency; Breakdown; Swelling Medical History: Negative for: History of Burn; History of pressure wounds Musculoskeletal Complaints and Symptoms: Negative for: Muscle Pain; Muscle Weakness Medical History: Negative for: Gout;  Rheumatoid Arthritis; Osteoarthritis; Osteomyelitis Neurologic Complaints and Symptoms: Negative for: Numbness/parasthesias; Focal/Weakness Medical History: Negative for: Dementia; Neuropathy; Quadriplegia; Paraplegia; Seizure Disorder Psychiatric Complaints and Symptoms: Positive for: Anxiety Negative for: Claustrophobia Medical History: Negative for: Anorexia/bulimia; Confinement Anxiety Eyes Medical History: Positive for: Cataracts - surgery 2000; Glaucoma Oncologic Complaints and Symptoms: No Complaints or Symptoms Medical History: Negative for: Received Chemotherapy; Received Radiation HBO Extended History Items [name], [name] H. ([id number]) Eyes: Eyes: Cataracts Glaucoma Immunizations Pneumococcal Vaccine: Received Pneumococcal Vaccination: Yes Implantable Devices None Hospitalization / Surgery History Name of Hospital Purpose of Hospitalization/Surgery Date [facility] Colon cancer  06/26/2018 Family and Social History Cancer: Yes - Siblings,Paternal Grandparents; Diabetes: Yes - Siblings; Heart Disease: Yes - Mother,Father,Child,Paternal Grandparents; Hereditary Spherocytosis: No; Hypertension: Yes - Child; Kidney Disease: No; Lung Disease: No; Seizures: No; Stroke: No; Thyroid Problems: No; Tuberculosis: No; Former smoker - quit 40 yrs ago; Marital Status - Divorced; Alcohol Use: Never; Drug Use: No History; Caffeine Use: Rarely; Financial Concerns: No; Food, Clothing or Shelter Needs: No; Support System Lacking: No; Transportation Concerns: No; Advanced Directives: No; Patient does not want information on Advanced Directives; Do not resuscitate: No; Living Will: Yes (Not Provided); Medical Power of Attorney: Yes - [name] (daughter) (Not Provided) Electronic Signature(s) Signed: 11/04/2018 4:08:43 PM By: [name] Signed: 11/05/2018 11:58:16 PM By: Worthy Keeler PA-C Entered By: [name] on 11/04/2018 15:20:16 [name], Carol HMarland Kitchen ([id number]) -------------------------------------------------------------------------------- SuperBill Details Patient Name: [name], [name] Date of Service: 11/04/2018 Medical Record Number: [id number] Patient Account Number: [id number] Date of Birth/Sex: 26-May-1928 (85 y.o. F) Treating RN: Montey Hora Primary Care Provider: Park Liter Other Clinician: Referring Provider: Referral, Self Treating Provider/Extender: [name], [name] Weeks in Treatment: 0 Diagnosis Coding ICD-10 Codes Code Description I87.2 Venous insufficiency (chronic) (peripheral) S81.802A Unspecified open wound, left lower leg, initial encounter Facility Procedures CPT4 Code: 34196222 Description: 97989 - WOUND CARE VISIT-LEV 3 EST PT Modifier: Quantity: 1 CPT4 Code: 21194174 Description: (Facility Use Only) 29581LT - [place] LWR LT LEG Modifier: Quantity: 1 Physician Procedures CPT4 Code: 0814481 Description: 85631 - WC PHYS  LEVEL 4 - EST PT ICD-10 Diagnosis Description I87.2 Venous insufficiency (chronic) (peripheral) S81.802A Unspecified open wound, left lower leg, initial encounter Modifier: Quantity: 1 Electronic Signature(s) Signed: 11/05/2018 11:58:16 PM By: Worthy Keeler PA-C Previous Signature: 11/04/2018 5:24:46 PM Version By: Montey Hora Entered By: Worthy Keeler on 11/05/2018 23:54:25

## 2018-11-07 NOTE — Progress Notes (Signed)
DEMIA, VIERA ([id number]) Visit Report for 11/04/2018 Allergy List Details Patient Name: [name]. Date of Service: 11/04/2018 2:45 PM Medical Record Number: [id number] Patient Account Number: [id number] Date of Birth/Sex: [birth year] (85 y.o. F) Treating RN: [name] Primary Care [name]: [name] [name]: Carol [name]: Referral, Self Treating [name]/Extender: [name], [name] [name]: 0 Allergies Active Allergies No Known Allergies Allergy Notes Electronic Signature(s) Signed: 11/04/2018 4:08:43 PM By: [name] Entered By: [name] on 11/04/2018 15:13:59 Rochford, [name] ([id number]) -------------------------------------------------------------------------------- Arrival Information Details Patient Name: [name]. Date of Service: 11/04/2018 2:45 PM Medical Record Number: [id number] Patient Account Number: [id number] Date of Birth/Sex: 07-16-1928 (85 y.o. F) Treating RN: [name] Primary Care [name]: [name] [name]: Carol Nasiya Pascual: Referral, Self Treating Hobie Kohles/Extender: [name], [name] [name]: 0 Visit Information Patient Arrived: Ambulatory Arrival Time: 15:05 Accompanied By: self Transfer Assistance: None Patient Identification Verified: Yes Secondary Verification Process Completed: Yes History Since Last Visit Added or deleted any medications: No Any new allergies or adverse reactions: No Had a fall or experienced change in activities of daily living that may affect risk of falls: No Signs or symptoms of abuse/neglect since last visito No Hospitalized since last visit: No Implantable device outside of the clinic excluding cellular tissue based products placed in the center since last visit: No Electronic Signature(s) Signed: 11/04/2018 4:08:43 PM By: [name] Entered By: [name] on 11/04/2018 15:10:44 Mannan, Biana Lemmie Wise  ([id number]) -------------------------------------------------------------------------------- Clinic Level of Care Assessment Details Patient Name: [name]. Date of Service: 11/04/2018 2:45 PM Medical Record Number: [id number] Patient Account Number: [id number] Date of Birth/Sex: May 05, 1928 (85 y.o. F) Treating RN: [name] Primary Care Malvina Schadler: [name] [name]: Carol [name]: Referral, Self Treating [name]/Extender: [name], [name] [name]: 0 Clinic Level of Care Assessment Items TOOL 1 Quantity Score []  - Use when EandM and Procedure is performed on INITIAL visit 0 ASSESSMENTS - Nursing Assessment / Reassessment X - General Physical Exam (combine w/ comprehensive assessment (listed just below) when 1 20 performed on new pt. evals) X- 1 25 Comprehensive Assessment (HX, ROS, Risk Assessments, Wounds Hx, etc.) ASSESSMENTS - Wound and Skin Assessment / Reassessment []  - Dermatologic / Skin Assessment (not related to wound area) 0 ASSESSMENTS - Ostomy and/or Continence Assessment and Care []  - Incontinence Assessment and Management 0 []  - 0 Ostomy Care Assessment and Management (repouching, etc.) PROCESS - Coordination of Care X - Simple Patient / Family Education for ongoing care 1 15 []  - 0 Complex (extensive) Patient / Family Education for ongoing care X- 1 10 Staff obtains Programmer, systems, Records, Test Results / Process Orders []  - 0 Staff telephones HHA, Nursing Homes / Clarify orders / etc []  - 0 Routine Transfer to another Facility (non-emergent condition) []  - 0 Routine Hospital Admission (non-emergent condition) X- 1 15 New Admissions / Biomedical engineer / Ordering NPWT, Apligraf, etc. []  - 0 Emergency Hospital Admission (emergent condition) PROCESS - Special Needs []  - Pediatric / Minor Patient Management 0 []  - 0 Isolation Patient Management []  - 0 Hearing / Language / Visual special needs []  - 0 Assessment of  Community assistance (transportation, D/C planning, etc.) []  - 0 Additional assistance / Altered mentation []  - 0 Support Surface(s) Assessment (bed, cushion, seat, etc.) [name]. ([id number]) INTERVENTIONS - Miscellaneous []  - External ear exam 0 []  - 0 Patient Transfer (multiple staff / Civil Service fast streamer / Similar devices) []  - 0 Simple Staple /  Suture removal (25 or less) []  - 0 Complex Staple / Suture removal (26 or more) []  - 0 Hypo/Hyperglycemic Management (do not check if billed separately) []  - 0 Ankle / Brachial Index (ABI) - do not check if billed separately Has the patient been seen at the hospital within the last three years: Yes Total Score: 85 Level Of Care: New/Established - Level 3 Electronic Signature(s) Signed: 11/04/2018 5:24:46 PM By: [name] Entered By: [name] on 11/04/2018 16:01:55 [name]. ([id number]) -------------------------------------------------------------------------------- Compression Therapy Details Patient Name: [name]. Date of Service: 11/04/2018 2:45 PM Medical Record Number: [id number] Patient Account Number: [id number] Date of Birth/Sex: 1927/11/23 (85 y.o. F) Treating RN: [name] Primary Care Saadia Dewitt: [name] [name]: Carol Shenouda Genova: Referral, Self Treating Santiel Topper/Extender: [name], [name] [name]: 0 Compression Therapy Performed for Wound Assessment: Wound #6 Left,Anterior Lower Leg Performed By: Wise Carol Hora, RN Compression Type: Three Layer Pre Wise ABI: 1.2 Post Procedure Diagnosis Same as Pre-procedure Electronic Signature(s) Signed: 11/04/2018 5:24:46 PM By: [name] Entered By: [name] on 11/04/2018 16:01:37 [name]. ([id number]) -------------------------------------------------------------------------------- Lower Extremity Assessment Details Patient Name: [name]. Date of Service: 11/04/2018 2:45  PM Medical Record Number: [id number] Patient Account Number: [id number] Date of Birth/Sex: 06/09/1928 (85 y.o. F) Treating RN: [name] Barman Primary Care Mithran Strike: [name] [name]: Carol Selina Tapper: Referral, Self Treating Harrell Niehoff/Extender: [name], [name] [name]: 0 Edema Assessment Assessed: [Left: No] [Right: No] [Left: Edema] [Right: :] Calf Left: Right: Point of Measurement: 30 cm From Medial Instep 31 cm cm Ankle Left: Right: Point of Measurement: 10 cm From Medial Instep 17.8 cm cm Vascular Assessment Claudication: Claudication Assessment [Left:None] Pulses: Dorsalis Pedis Palpable: [Left:Yes] Posterior Tibial Extremity colors, hair growth, and conditions: Extremity Color: [Left:Hyperpigmented] Hair Growth on Extremity: [Left:No] Temperature of Extremity: [Left:Warm] Capillary Refill: [Left:< 3 seconds] Toe Nail Assessment Left: Right: Thick: No Discolored: No Deformed: No Improper Length and Hygiene: No Electronic Signature(s) Signed: 11/04/2018 3:25:30 PM By: Gretta Cool, BSN, RN, CWS, Kim RN, BSN Entered By: Gretta Cool, BSN, RN, CWS, Kim on 11/04/2018 15:25:29 Hine, [name] ([id number]) -------------------------------------------------------------------------------- Multi Wound Chart Details Patient Name: [name]. Date of Service: 11/04/2018 2:45 PM Medical Record Number: [id number] Patient Account Number: [id number] Date of Birth/Sex: March 06, 1928 (85 y.o. F) Treating RN: [name] Primary Care Catori Panozzo: [name] [name]: Carol Jadaya Sommerfield: Referral, Self Treating Adith Tejada/Extender: [name], [name] [name]: 0 Vital Signs Height(in): 62 Pulse(bpm): 54 Weight(lbs): 92 Blood Pressure(mmHg): 104/80 Body Mass Index(BMI): 17 Temperature(F): 97.8 Respiratory Rate 16 (breaths/min): Photos: [N/A:N/A] Wound Location: Left Lower Leg - Anterior N/A N/A Wounding Event: Trauma N/A N/A Primary  Etiology: Trauma, Carol N/A N/A Comorbid History: Cataracts, Glaucoma, Asthma, N/A N/A Chronic Obstructive Pulmonary Disease (COPD), Arrhythmia, Hypertension Date Acquired: 09/01/2018 N/A N/A Carol of Wise: 0 N/A N/A Wound Status: Open N/A N/A Measurements L x W x D 0.1x0.1x0.1 N/A N/A (cm) Area (cm) : 0.008 N/A N/A Volume (cm) : 0.001 N/A N/A Classification: Partial Thickness N/A N/A Exudate Amount: None Present N/A N/A Wound Margin: Indistinct, nonvisible N/A N/A Granulation Amount: None Present (0%) N/A N/A Necrotic Amount: None Present (0%) N/A N/A Exposed Structures: Fascia: No N/A N/A Fat Layer (Subcutaneous Tissue) Exposed: No Tendon: No Muscle: No Joint: No Bone: No Limited to Skin Breakdown Epithelialization: Large (67-100%) N/A N/A Periwound Skin Texture: N/A N/A [name]. ([id number]) Scarring: Yes Excoriation: No Induration: No Callus: No Crepitus: No Rash: No Periwound Skin  Moisture: Dry/Scaly: Yes N/A N/A Maceration: No Periwound Skin Color: Atrophie Blanche: No N/A N/A Cyanosis: No Ecchymosis: No Erythema: No Hemosiderin Staining: No Mottled: No Pallor: No Rubor: No Tenderness on Palpation: No N/A N/A Wound Preparation: Topical Anesthetic Applied: N/A N/A None Procedures Performed: Compression Therapy N/A N/A Wise Notes Electronic Signature(s) Signed: 11/04/2018 5:24:46 PM By: [name] Entered By: [name] on 11/04/2018 16:05:03 [name] ([id number]) -------------------------------------------------------------------------------- Multi-Disciplinary Care Plan Details Patient Name: Carol Games. Date of Service: 11/04/2018 2:45 PM Medical Record Number: [id number] Patient Account Number: [id number] Date of Birth/Sex: [birth year] (85 y.o. F) Treating RN: [name] Primary Care Jonnatan Hanners: [name] [name]: Carol Christoph Copelan: Referral, Self Treating Maylon Sailors/Extender: [name], [name] [name]: 0 Active Inactive Abuse / Safety / Falls / Self Care Management Nursing Diagnoses: Potential for falls Goals: Patient will remain injury free related to falls Date Initiated: 11/04/2018 Target Resolution Date: 02/07/2019 Goal Status: Active Interventions: Assess fall risk on admission and as needed Notes: Electronic Signature(s) Signed: 11/04/2018 5:24:46 PM By: [name] Entered By: [name] on 11/04/2018 16:05:33 [name]. ([id number]) -------------------------------------------------------------------------------- Pain Assessment Details Patient Name: [name]. Date of Service: 11/04/2018 2:45 PM Medical Record Number: [id number] Patient Account Number: [id number] Date of Birth/Sex: [birth year] (85 y.o. F) Treating RN: [name] Primary Care Nayeliz Hipp: [name] [name]: Carol Latoiya Maradiaga: Referral, Self Treating Alicia Ackert/Extender: [name], [name] [name]: 0 Active Problems Location of Pain Severity and Description of Pain Patient Has Paino No Site Locations Pain Management and Medication Current Pain Management: Electronic Signature(s) Signed: 11/04/2018 4:08:43 PM By: [name] Entered By: [name] on 11/04/2018 15:10:55 Haning, [name] ([id number]) -------------------------------------------------------------------------------- Patient/Caregiver Education Details Patient Name: Carol Games. Date of Service: 11/04/2018 2:45 PM Medical Record Number: [id number] Patient Account Number: [id number] Date of Birth/Gender: Nov 14, 1927 (85 y.o. F) Treating RN: [name] Primary Care Physician: [name] [name]: Carol Physician: Referral, Self Treating Physician/Extender: Sharalyn Ink in Wise: 0 Education Assessment Education Provided To: Patient Education Topics Provided Venous: Handouts: Carol: wrap precautions Methods:  Explain/Verbal Responses: State content correctly Electronic Signature(s) Signed: 11/04/2018 5:24:46 PM By: [name] Entered By: [name] on 11/04/2018 16:03:16 [name]. ([id number]) -------------------------------------------------------------------------------- Wound Assessment Details Patient Name: [name]. Date of Service: 11/04/2018 2:45 PM Medical Record Number: [id number] Patient Account Number: [id number] Date of Birth/Sex: Dec 02, 1927 (85 y.o. F) Treating RN: [name] Barman Primary Care Cory Rama: [name] [name]: Carol Rital Cavey: Referral, Self Treating Minami Arriaga/Extender: [name], [name] [name]: 0 Wound Status Wound Number: 6 Primary Trauma, Carol Etiology: Wound Location: Left Lower Leg - Anterior Wound Open Wounding Event: Trauma Status: Date Acquired: 09/01/2018 Comorbid Cataracts, Glaucoma, Asthma, Chronic Carol Of Wise: 0 History: Obstructive Pulmonary Disease (COPD), Clustered Wound: No Arrhythmia, Hypertension Photos Photo Uploaded By: Gretta Cool, BSN, RN, CWS, Kim on 11/04/2018 15:49:08 Wound Measurements Length: (cm) 0.1 Width: (cm) 0.1 Depth: (cm) 0.1 Area: (cm) 0.008 Volume: (cm) 0.001 % Reduction in Area: % Reduction in Volume: Epithelialization: Large (67-100%) Tunneling: No Undermining: No Wound Description Classification: Partial Thickness Foul Odor Wound Margin: Indistinct, nonvisible Slough/Fi Exudate Amount: None Present After Cleansing: No brino No Wound Bed Granulation Amount: None Present (0%) Exposed Structure Necrotic Amount: None Present (0%) Fascia Exposed: No Fat Layer (Subcutaneous Tissue) Exposed: No Tendon Exposed: No Muscle Exposed: No Joint Exposed: No Bone Exposed: No Limited to Skin Breakdown Periwound Skin Texture Texture Color [name]. ([id number]) No Abnormalities Noted: No  No Abnormalities Noted: No Callus: No Atrophie Blanche:  No Crepitus: No Cyanosis: No Excoriation: No Ecchymosis: No Induration: No Erythema: No Rash: No Hemosiderin Staining: No Scarring: Yes Mottled: No Pallor: No Moisture Rubor: No No Abnormalities Noted: No Dry / Scaly: Yes Maceration: No Wound Preparation Topical Anesthetic Applied: None Electronic Signature(s) Signed: 11/04/2018 3:24:34 PM By: Gretta Cool, BSN, RN, CWS, Kim RN, BSN Entered By: Gretta Cool, BSN, RN, CWS, Kim on 11/04/2018 15:24:33 [name] ([id number]) -------------------------------------------------------------------------------- Vitals Details Patient Name: Carol Games. Date of Service: 11/04/2018 2:45 PM Medical Record Number: [id number] Patient Account Number: [id number] Date of Birth/Sex: [birth year] (85 y.o. F) Treating RN: [name] Primary Care Magdala Brahmbhatt: [name] [name]: Carol Demondre Aguas: Referral, Self Treating Doyne Ellinger/Extender: [name], [name] [name]: 0 Vital Signs Time Taken: 15:11 Temperature (F): 97.8 Height (in): 62 Pulse (bpm): 62 Source: Stated Respiratory Rate (breaths/min): 16 Weight (lbs): 92 Blood Pressure (mmHg): 104/80 Source: Stated Reference Range: 80 - 120 mg / dl Body Mass Index (BMI): 16.8 Electronic Signature(s) Signed: 11/04/2018 4:08:43 PM By: [name] Entered By: [name] on 11/04/2018 15:12:27

## 2018-11-10 ENCOUNTER — Other Ambulatory Visit: Payer: Self-pay | Admitting: Family Medicine

## 2018-11-11 ENCOUNTER — Encounter: Payer: Medicare Other | Admitting: Physician Assistant

## 2018-11-11 DIAGNOSIS — L97821 Non-pressure chronic ulcer of other part of left lower leg limited to breakdown of skin: Secondary | ICD-10-CM | POA: Diagnosis not present

## 2018-11-11 DIAGNOSIS — L97929 Non-pressure chronic ulcer of unspecified part of left lower leg with unspecified severity: Secondary | ICD-10-CM | POA: Diagnosis not present

## 2018-11-11 DIAGNOSIS — J449 Chronic obstructive pulmonary disease, unspecified: Secondary | ICD-10-CM | POA: Diagnosis not present

## 2018-11-11 DIAGNOSIS — I10 Essential (primary) hypertension: Secondary | ICD-10-CM | POA: Diagnosis not present

## 2018-11-13 NOTE — Progress Notes (Signed)
EVERLI, ROTHER ([id number]) Visit Report for 11/11/2018 Arrival Information Details Patient Name: [name], [name]. Date of Service: 11/11/2018 3:00 PM Medical Record Number: [id number] Patient Account Number: [id number] Date of Birth/Sex: 1928/06/01 (85 y.o. F) Treating RN: Harold Barban Primary Care Regana Kemple: Park Liter Other Clinician: Referring Larone Kliethermes: Park Liter Treating Ariyel Jeangilles/Extender: [name], [name] Weeks in Treatment: 1 Visit Information History Since Last Visit Added or deleted any medications: No Patient Arrived: Ambulatory Any new allergies or adverse reactions: No Arrival Time: 15:20 Had a fall or experienced change in No Accompanied By: self activities of daily living that may affect Transfer Assistance: None risk of falls: Patient Identification Verified: Yes Signs or symptoms of abuse/neglect since last visito No Secondary Verification Process Completed: Yes Hospitalized since last visit: No Has Dressing in Place as Prescribed: Yes Has Compression in Place as Prescribed: Yes Pain Present Now: Yes Electronic Signature(s) Signed: 11/12/2018 8:48:17 AM By: Harold Barban Entered By: Harold Barban on 11/11/2018 15:23:04 [name] ([id number]) -------------------------------------------------------------------------------- Clinic Level of Care Assessment Details Patient Name: Carol Berger H. Date of Service: 11/11/2018 3:00 PM Medical Record Number: [id number] Patient Account Number: [id number] Date of Birth/Sex: 1927-11-20 (85 y.o. F) Treating RN: [name] Barman Primary Care Thurmond Hildebran: Park Liter Other Clinician: Referring Hanan Moen: Park Liter Treating Xiana Carns/Extender: [name], [name] Weeks in Treatment: 1 Clinic Level of Care Assessment Items TOOL 4 Quantity Score []  - Use when only an EandM is performed on FOLLOW-UP visit 0 ASSESSMENTS - Nursing Assessment / Reassessment []  - Reassessment of Co-morbidities (includes  updates in patient status) 0 X- 1 5 Reassessment of Adherence to Treatment Plan ASSESSMENTS - Wound and Skin Assessment / Reassessment []  - Simple Wound Assessment / Reassessment - one wound 0 []  - 0 Complex Wound Assessment / Reassessment - multiple wounds []  - 0 Dermatologic / Skin Assessment (not related to wound area) ASSESSMENTS - Focused Assessment []  - Circumferential Edema Measurements - multi extremities 0 []  - 0 Nutritional Assessment / Counseling / Intervention []  - 0 Lower Extremity Assessment (monofilament, tuning fork, pulses) []  - 0 Peripheral Arterial Disease Assessment (using hand held doppler) ASSESSMENTS - Ostomy and/or Continence Assessment and Care []  - Incontinence Assessment and Management 0 []  - 0 Ostomy Care Assessment and Management (repouching, etc.) PROCESS - Coordination of Care X - Simple Patient / Family Education for ongoing care 1 15 []  - 0 Complex (extensive) Patient / Family Education for ongoing care []  - 0 Staff obtains Programmer, systems, Records, Test Results / Process Orders []  - 0 Staff telephones HHA, Nursing Homes / Clarify orders / etc []  - 0 Routine Transfer to another Facility (non-emergent condition) []  - 0 Routine Hospital Admission (non-emergent condition) []  - 0 New Admissions / Biomedical engineer / Ordering NPWT, Apligraf, etc. []  - 0 Emergency Hospital Admission (emergent condition) []  - 0 Simple Discharge Coordination [name], [name] ([id number]) X- 1 15 Complex (extensive) Discharge Coordination PROCESS - Special Needs []  - Pediatric / Minor Patient Management 0 []  - 0 Isolation Patient Management []  - 0 Hearing / Language / Visual special needs []  - 0 Assessment of Community assistance (transportation, D/C planning, etc.) []  - 0 Additional assistance / Altered mentation []  - 0 Support Surface(s) Assessment (bed, cushion, seat, etc.) INTERVENTIONS - Wound Cleansing / Measurement X - Simple Wound Cleansing -  one wound 1 5 []  - 0 Complex Wound Cleansing - multiple wounds X- 1 5 Wound Imaging (photographs - any number of wounds) []  - 0 Wound Tracing (instead of photographs) []  -  0 Simple Wound Measurement - one wound []  - 0 Complex Wound Measurement - multiple wounds INTERVENTIONS - Wound Dressings []  - Small Wound Dressing one or multiple wounds 0 []  - 0 Medium Wound Dressing one or multiple wounds []  - 0 Large Wound Dressing one or multiple wounds []  - 0 Application of Medications - topical []  - 0 Application of Medications - injection INTERVENTIONS - Miscellaneous []  - External ear exam 0 []  - 0 Specimen Collection (cultures, biopsies, blood, body fluids, etc.) []  - 0 Specimen(s) / Culture(s) sent or taken to Lab for analysis []  - 0 Patient Transfer (multiple staff / Civil Service fast streamer / Similar devices) []  - 0 Simple Staple / Suture removal (25 or less) []  - 0 Complex Staple / Suture removal (26 or more) []  - 0 Hypo / Hyperglycemic Management (close monitor of Blood Glucose) []  - 0 Ankle / Brachial Index (ABI) - do not check if billed separately X- 1 5 Vital Signs [name], [name] ([id number]) Has the patient been seen at the hospital within the last three years: Yes Total Score: 50 Level Of Care: New/Established - Level 2 Electronic Signature(s) Signed: 11/11/2018 6:07:40 PM By: Gretta Cool, BSN, RN, CWS, Kim RN, BSN Entered By: Gretta Cool, BSN, RN, CWS, Kim on 11/11/2018 15:41:01 [name], [name] ([id number]) -------------------------------------------------------------------------------- Encounter Discharge Information Details Patient Name: [name], [name] Date of Service: 11/11/2018 3:00 PM Medical Record Number: [id number] Patient Account Number: [id number] Date of Birth/Sex: 10-06-1927 (85 y.o. F) Treating RN: [name] Barman Primary Care Nevaen Tredway: Park Liter Other Clinician: Referring Shametra Cumberland: Park Liter Treating Westlynn Fifer/Extender: [name], [name] Weeks in  Treatment: 1 Encounter Discharge Information Items Discharge Condition: Stable Ambulatory Status: Ambulatory Discharge Destination: Home Transportation: Private Auto Accompanied By: self Schedule Follow-up Appointment: Yes Clinical Summary of Care: Electronic Signature(s) Signed: 11/11/2018 6:07:40 PM By: Gretta Cool, BSN, RN, CWS, Kim RN, BSN Entered By: Gretta Cool, BSN, RN, CWS, Kim on 11/11/2018 15:43:46 Overall, [name] ([id number]) -------------------------------------------------------------------------------- Lower Extremity Assessment Details Patient Name: Haapala, Lowell H. Date of Service: 11/11/2018 3:00 PM Medical Record Number: [id number] Patient Account Number: [id number] Date of Birth/Sex: Feb 16, 1928 (85 y.o. F) Treating RN: Harold Barban Primary Care Brizeyda Holtmeyer: Park Liter Other Clinician: Referring Yarissa Reining: Park Liter Treating Denny Lave/Extender: [name], [name] Weeks in Treatment: 1 Edema Assessment Assessed: [Left: No] [Right: No] [Left: Edema] [Right: :] Calf Left: Right: Point of Measurement: 30 cm From Medial Instep 31 cm cm Ankle Left: Right: Point of Measurement: 10 cm From Medial Instep 17.5 cm cm Vascular Assessment Pulses: Dorsalis Pedis Palpable: [Left:Yes] Posterior Tibial Palpable: [Left:Yes] Extremity colors, hair growth, and conditions: Extremity Color: [Left:Hyperpigmented] Hair Growth on Extremity: [Left:No] Temperature of Extremity: [Left:Warm] Capillary Refill: [Left:< 3 seconds] Toe Nail Assessment Left: Right: Thick: Yes Discolored: Yes Deformed: Yes Improper Length and Hygiene: No Electronic Signature(s) Signed: 11/12/2018 8:48:17 AM By: Harold Barban Entered By: Harold Barban on 11/11/2018 15:35:03 [name], [name] ([id number]) -------------------------------------------------------------------------------- Multi Wound Chart Details Patient Name: Kutscher, Rica H. Date of Service: 11/11/2018 3:00 PM Medical  Record Number: [id number] Patient Account Number: [id number] Date of Birth/Sex: 16-Aug-1928 (85 y.o. F) Treating RN: [name] Barman Primary Care Tamu Golz: Park Liter Other Clinician: Referring Yaniel Limbaugh: Park Liter Treating Samyia Motter/Extender: [name], [name] Weeks in Treatment: 1 Vital Signs Height(in): 62 Pulse(bpm): 42 Weight(lbs): 72 Blood Pressure(mmHg): 141/45 Body Mass Index(BMI): 17 Temperature(F): 97.7 Respiratory Rate 16 (breaths/min): Photos: [6:No Photos] [N/A:N/A] Wound Location: [6:Left Lower Leg - Anterior] [N/A:N/A] Wounding Event: [6:Trauma] [N/A:N/A] Primary Etiology: [6:Trauma, Other] [N/A:N/A] Comorbid History: [6:Cataracts, Glaucoma, Asthma,  N/A Chronic Obstructive Pulmonary Disease (COPD), Arrhythmia, Hypertension] Date Acquired: [6:09/01/2018] [N/A:N/A] Weeks of Treatment: [6:1] [N/A:N/A] Wound Status: [6:Open] [N/A:N/A] Measurements L x W x D [6:0.1x0.1x0.1] [N/A:N/A] (cm) Area (cm) : [5:0.539] [N/A:N/A] Volume (cm) : [6:0.001] [N/A:N/A] % Reduction in Area: [6:0.00%] [N/A:N/A] % Reduction in Volume: [6:0.00%] [N/A:N/A] Classification: [6:Partial Thickness] [N/A:N/A] Exudate Amount: [6:None Present] [N/A:N/A] Wound Margin: [6:Indistinct, nonvisible] [N/A:N/A] Granulation Amount: [6:None Present (0%)] [N/A:N/A] Necrotic Amount: [6:None Present (0%)] [N/A:N/A] Exposed Structures: [6:Fascia: No Fat Layer (Subcutaneous Tissue) Exposed: No Tendon: No Muscle: No Joint: No Bone: No Limited to Skin Breakdown] [N/A:N/A] Epithelialization: [6:Large (67-100%)] [N/A:N/A] Periwound Skin Texture: [6:Scarring: Yes Excoriation: No Induration: No Callus: No Crepitus: No Rash: No] [N/A:N/A] Periwound Skin Moisture: Dry/Scaly: Yes N/A N/A Maceration: No Periwound Skin Color: Atrophie Blanche: No N/A N/A Cyanosis: No Ecchymosis: No Erythema: No Hemosiderin Staining: No Mottled: No Pallor: No Rubor: No Tenderness on Palpation: No N/A N/A Wound  Preparation: Ulcer Cleansing: Other: soap N/A N/A and water Topical Anesthetic Applied: None Treatment Notes Electronic Signature(s) Signed: 11/11/2018 6:07:40 PM By: Gretta Cool, BSN, RN, CWS, Kim RN, BSN Entered By: Gretta Cool, BSN, RN, CWS, Kim on 11/11/2018 15:40:01 [name], [name] ([id number]) -------------------------------------------------------------------------------- Multi-Disciplinary Care Plan Details Patient Name: Carol Games. Date of Service: 11/11/2018 3:00 PM Medical Record Number: [id number] Patient Account Number: [id number] Date of Birth/Sex: 08-Mar-1928 (85 y.o. F) Treating RN: [name] Barman Primary Care Katherene Dinino: Park Liter Other Clinician: Referring Decarlo Rivet: Park Liter Treating Deklan Minar/Extender: Sharalyn Ink in Treatment: 1 Active Inactive Electronic Signature(s) Signed: 11/11/2018 6:07:40 PM By: Gretta Cool, BSN, RN, CWS, Kim RN, BSN Entered By: Gretta Cool, BSN, RN, CWS, Kim on 11/11/2018 15:39:48 Carol Games ([id number]) -------------------------------------------------------------------------------- Pain Assessment Details Patient Name: Carol Games. Date of Service: 11/11/2018 3:00 PM Medical Record Number: [id number] Patient Account Number: [id number] Date of Birth/Sex: Jan 28, 1928 (85 y.o. F) Treating RN: Harold Barban Primary Care Aiana Nordquist: Park Liter Other Clinician: Referring Prosper Paff: Park Liter Treating Tino Ronan/Extender: [name], [name] Weeks in Treatment: 1 Active Problems Location of Pain Severity and Description of Pain Patient Has Paino Yes Site Locations Duration of the Pain. Constant / Intermittento Intermittent How Long Does it Lasto Hours: Minutes: 2 Rate the pain. Current Pain Level: 8 Character of Pain Describe the Pain: Sharp Pain Management and Medication Current Pain Management: Notes Pain on Shin, not wound Electronic Signature(s) Signed: 11/12/2018 8:48:17 AM By: Harold Barban Entered By:  Harold Barban on 11/11/2018 15:25:17 [name], [name] ([id number]) -------------------------------------------------------------------------------- Patient/Caregiver Education Details Patient Name: Carol Games. Date of Service: 11/11/2018 3:00 PM Medical Record Number: [id number] Patient Account Number: [id number] Date of Birth/Gender: May 14, 1928 (85 y.o. F) Treating RN: [name] Barman Primary Care Physician: Park Liter Other Clinician: Referring Physician: Park Liter Treating Physician/Extender: Sharalyn Ink in Treatment: 1 Education Assessment Education Provided To: Patient Education Topics Provided Wound/Skin Impairment: Handouts: Other: wear compression stockings Electronic Signature(s) Signed: 11/11/2018 6:07:40 PM By: Gretta Cool, BSN, RN, CWS, Kim RN, BSN Entered By: Gretta Cool, BSN, RN, CWS, Kim on 11/11/2018 15:43:13 [name], [name] ([id number]) -------------------------------------------------------------------------------- Wound Assessment Details Patient Name: [name], [name] Date of Service: 11/11/2018 3:00 PM Medical Record Number: [id number] Patient Account Number: [id number] Date of Birth/Sex: [birth year] (85 y.o. F) Treating RN: [name] Barman Primary Care Philis Doke: Park Liter Other Clinician: Referring TRUE Shackleford: Park Liter Treating Shanine Kreiger/Extender: [name], [name] Weeks in Treatment: 1 Wound Status Wound Number: 6 Primary Trauma, Other Etiology: Wound Location: Left, Anterior Lower Leg Wound Healed - Epithelialized Wounding Event: Trauma Status: Date Acquired:  09/01/2018 Comorbid Cataracts, Glaucoma, Asthma, Chronic Weeks Of Treatment: 1 History: Obstructive Pulmonary Disease (COPD), Clustered Wound: No Arrhythmia, Hypertension Wound Measurements Length: (cm) 0 % Red Width: (cm) 0 % Red Depth: (cm) 0 Epith Area: (cm) 0 Tunn Volume: (cm) 0 Unde uction in Area: 100% uction in Volume: 100% elialization: Large  (67-100%) eling: No rmining: No Wound Description Classification: Partial Thickness Foul Wound Margin: Indistinct, nonvisible Sloug Exudate Amount: None Present Odor After Cleansing: No h/Fibrino No Wound Bed Granulation Amount: None Present (0%) Exposed Structure Necrotic Amount: None Present (0%) Fascia Exposed: No Fat Layer (Subcutaneous Tissue) Exposed: No Tendon Exposed: No Muscle Exposed: No Joint Exposed: No Bone Exposed: No Limited to Skin Breakdown Periwound Skin Texture Texture Color No Abnormalities Noted: No No Abnormalities Noted: No Callus: No Atrophie Blanche: No Crepitus: No Cyanosis: No Excoriation: No Ecchymosis: No Induration: No Erythema: No Rash: No Hemosiderin Staining: No Scarring: Yes Mottled: No Pallor: No Moisture Rubor: No No Abnormalities Noted: No Dry / Scaly: Yes Maceration: No [name], [name] ([id number]) Wound Preparation Ulcer Cleansing: Other: soap and water, Topical Anesthetic Applied: None Electronic Signature(s) Signed: 11/11/2018 6:07:40 PM By: Gretta Cool, BSN, RN, CWS, Kim RN, BSN Entered By: Gretta Cool, BSN, RN, CWS, Kim on 11/11/2018 15:43:30 [name], [name] ([id number]) -------------------------------------------------------------------------------- Vitals Details Patient Name: Carol Berger H. Date of Service: 11/11/2018 3:00 PM Medical Record Number: [id number] Patient Account Number: [id number] Date of Birth/Sex: Aug 17, 1928 (85 y.o. F) Treating RN: Harold Barban Primary Care Lynnda Wiersma: Park Liter Other Clinician: Referring Bracken Moffa: Park Liter Treating Sharece Fleischhacker/Extender: [name], [name] Weeks in Treatment: 1 Vital Signs Time Taken: 15:24 Temperature (F): 97.7 Height (in): 62 Pulse (bpm): 63 Weight (lbs): 92 Respiratory Rate (breaths/min): 16 Body Mass Index (BMI): 16.8 Blood Pressure (mmHg): 141/45 Reference Range: 80 - 120 mg / dl Electronic Signature(s) Signed: 11/12/2018 8:48:17 AM By:  Harold Barban Entered By: Harold Barban on 11/11/2018 15:24:28

## 2018-11-13 NOTE — Progress Notes (Signed)
[name], [name] ([id number]) Visit Report for 11/11/2018 Chief Complaint Document Details Patient Name: [name], [name]. Date of Service: 11/11/2018 3:00 PM Medical Record Number: [id number] Patient Account Number: [id number] Date of Birth/Sex: 15-Sep-1927 (85 y.o. F) Treating RN: [name] Barman Primary Care Provider: Park Liter Other Clinician: Referring Provider: Park Liter Treating Provider/Extender: [name], Avyn Coate Weeks in Treatment: 1 Information Obtained from: Patient Chief Complaint Left leg ulcer Electronic Signature(s) Signed: 11/12/2018 1:14:32 PM By: Worthy Keeler PA-C Entered By: Worthy Keeler on 11/11/2018 15:13:44 [name], [name] ([id number]) -------------------------------------------------------------------------------- HPI Details Patient Name: [name]. Date of Service: 11/11/2018 3:00 PM Medical Record Number: [id number] Patient Account Number: [id number] Date of Birth/Sex: 08-Feb-1928 (85 y.o. F) Treating RN: [name] Barman Primary Care Provider: Park Liter Other Clinician: Referring Provider: Park Liter Treating Provider/Extender: [name], Leotta Weingarten Weeks in Treatment: 1 History of Present Illness HPI Description: 02/12/18 ADMISSION This is an 85 year old woman who is recently moved to [place] from [place]. Her story began in February where she fell on some ice suffering extensive lacerations to her bilateral lower extremities. She is able to show me extensive pictures of the right lower leg but with going to a wound care center and [place] 3 times a week these eventually closed over. She has been left with 1 area on the upper lateral left calf. She has been applying Neosporin to this and applying a bandage. Currently this measures 2 x 1.5 cm. The patient is not a diabetic. She is an ex-smoker quitting 40 years ago. She does have COPD by description. ABIs in our clinic were 1.32 on the right and 1.03 on the left 02/19/18; right  lower leg wound which was initially trauma. The area that we look that last week is smaller. Still covered in a nonviable surface however. We are using Iodoflex 02/26/18; right lower leg wound which was initially trauma in the setting of chronic venous insufficiency. Surface of the wound looks much better healthy granulation advancing epithelialization. We have good edema control 03/05/18; right lower leg wound which was initially trauma in the setting of chronic venous insufficiency. She continues to make nice progress here. We have good edema control oHe arrives today with a new traumatic laceration on her right dorsal forearm which she states happened while she was putting on a sweatshirt 03/12/18; right lower leg wound as closed however it still looks vulnerable. She has chronic venous insufficiency. oThe new wound from last week a traumatic area on her right dorsal or arm unfortunately does not have a viable surface. I remove some nonviable skin and necrotic subcutaneous debris from the wound surface. Hemostasis with silver nitrate and direct pressure 03/19/18; right lower leg wound is closed. She has chronic venous insufficiency and will need ongoing compression oThe new wounds from 2 weeks ago on the right upper elbow is closed. The area on her right dorsal forearm continues to have a nonviable surface requiring debridement 03/26/18; we'll need to look into ongoing compression for her legs. 2 wounds on the right upper elbow is closed. The area on the right dorsal forearm looks better. Hydrofera Blue secondary to hypertrophic granulation 04/02/18; he has compression stockings although she did not wear them today. Severe chronic venous insufficiency. She has 2 wounds on the right upper elbow which I said were closed last week which actually are although they're very small and she has a smaller clean wound on the right dorsal forearm. We've been using Hydrofera Blue secondary to some  upper  granulation. All of this looks better 04/09/18-She is seen in follow up evaluation for a right elbow and right forearm skin tear. The right elbow is healed. We will continue with same treatment plan and she'll follow next week 04/16/18-She is seen in follow-up evaluation for right forearm skin tear, this is essentially healed. We will cover with foam border and she will follow-up next week 04/23/18; the patient arrives with a new skin tear on her right dorsal arm just below the elbow. She got this while carrying a box. She has a linear skin tear. There is no depth I don't think this should've been sutured. There is a skin flap medially I'm not sure if this will be viable or not 04/30/18; the new skin tear from last week unfortunately has remained viable in terms of the skin flap. She has a small open area that looks healthy. Using moistened silver collagen 05/07/18; right dorsal arm skin tear. Nonviable tissue over the remnants of the wound. We've been using silver collagen 05/21/18; right dorsal arm skin tear. Nonviable tissue over a small remnant of the wound was washed off. We've been using silver collagen which we will continue 06/04/18; the right dorsal arm skin tear has healed. She arrives today with a traumatic wound just above the olecranon of her left elbow. This is a skin tear with a nonviable nonadherent flap which was removed. We'll use silver collagen here. Also noteworthy she arrives without her compression stockings 06/18/18; the area just above the olecranon of her left elbow. It is smaller and generally has a healthy surface. I was surprised [name], [name]. ([id number]) to learn that the patient is actually changing this herself although. Appears this week she was putting some topical lidocaine on this that she bought over-the-counter at the drugstore. She did get supplies at home she didn't know how to put it on. She drove herself here today i.e. not eligible for home  health Readmission: 11/04/18 on evaluation today patient presents for evaluation our clinic news to include the she has on her lower extremity which occurred as the result of having hit this on a piece of furniture. This was back in December. She states that her daughter has been trying to get her to come into have this evaluated here the wound care center but she was being somewhat stubborn. Nonetheless upon evaluation today the wound really appears to be potentially healed she does have some swelling of lower extremity which I think will come into play. With that being said he tells me that even yesterday this was still making clear fluid and therefore there may be some issues with continued problems with the swelling and drainage secondary to her venous stasis. Nonetheless overall wound appears to be doing fairly well which is good news. 11/11/18 on evaluation today patient actually appears to be doing excellent in the area of question that was green just the day before he saw her last time actually appears to have not drain that always in past week. I think this is done very well and at this point though she's had some discomfort I do not see any evidence of open wound which he needs to continue see wound care. Electronic Signature(s) Signed: 11/12/2018 1:14:32 PM By: Worthy Keeler PA-C Entered By: Worthy Keeler on 11/11/2018 15:42:59 [name], [name] ([id number]) -------------------------------------------------------------------------------- Physical Exam Details Patient Name: [name], [name] Date of Service: 11/11/2018 3:00 PM Medical Record Number: [id number] Patient Account Number: [id number] Date of Birth/Sex: 1928/05/03 (85  y.o. F) Treating RN: [name] Barman Primary Care Provider: Park Liter Other Clinician: Referring Provider: Park Liter Treating Provider/Extender: [name], Zan Triska Weeks in Treatment: 1 Constitutional Well-nourished and well-hydrated in no acute  distress. Respiratory normal breathing without difficulty. Psychiatric this patient is able to make decisions and demonstrates good insight into disease process. Alert and Oriented x 3. pleasant and cooperative. Notes Patient's wound bed again show signs of complete epithelialization will be addressed again I think some lotion would be helpful. Other than that I see no issues currently. Electronic Signature(s) Signed: 11/12/2018 1:14:32 PM By: Worthy Keeler PA-C Entered By: Worthy Keeler on 11/11/2018 15:43:23 [name], [name] ([id number]) -------------------------------------------------------------------------------- Physician Orders Details Patient Name: [name]. Date of Service: 11/11/2018 3:00 PM Medical Record Number: [id number] Patient Account Number: [id number] Date of Birth/Sex: [birth year] (85 y.o. F) Treating RN: [name] Barman Primary Care Provider: Park Liter Other Clinician: Referring Provider: Park Liter Treating Provider/Extender: [name], Jyair Kiraly Weeks in Treatment: 1 Verbal / Phone Orders: No Diagnosis Coding ICD-10 Coding Code Description I87.2 Venous insufficiency (chronic) (peripheral) S81.802A Unspecified open wound, left lower leg, initial encounter Discharge From [facility] [place] Leg o Discharge from [facility] - treatment complete Electronic Signature(s) Signed: 11/11/2018 6:07:40 PM By: Gretta Cool, BSN, RN, CWS, Kim RN, BSN Signed: 11/12/2018 1:14:32 PM By: Worthy Keeler PA-C Entered By: Gretta Cool, BSN, RN, CWS, Kim on 11/11/2018 15:40:36 [name], [name] ([id number]) -------------------------------------------------------------------------------- Problem List Details Patient Name: Vogelgesang, Yaffa H. Date of Service: 11/11/2018 3:00 PM Medical Record Number: [id number] Patient Account Number: [id number] Date of Birth/Sex: Jun 29, 1928 (85 y.o. F) Treating RN: [name] Barman Primary Care Provider: Park Liter Other Clinician: Referring Provider: Park Liter Treating Provider/Extender: [name], Sherria Riemann Weeks in Treatment: 1 Active Problems ICD-10 Evaluated Encounter Code Description Active Date Today Diagnosis I87.2 Venous insufficiency (chronic) (peripheral) 11/04/2018 No Yes S81.802A Unspecified open wound, left lower leg, initial encounter 11/04/2018 No Yes Inactive Problems Resolved Problems Electronic Signature(s) Signed: 11/12/2018 1:14:32 PM By: Worthy Keeler PA-C Entered By: Worthy Keeler on 11/11/2018 15:13:39 [name], [name] ([id number]) -------------------------------------------------------------------------------- Progress Note Details Patient Name: [name], [name] Date of Service: 11/11/2018 3:00 PM Medical Record Number: [id number] Patient Account Number: [id number] Date of Birth/Sex: 06-16-1928 (85 y.o. F) Treating RN: [name] Barman Primary Care Provider: Park Liter Other Clinician: Referring Provider: Park Liter Treating Provider/Extender: [name], Lavonn Maxcy Weeks in Treatment: 1 Subjective Chief Complaint Information obtained from Patient Left leg ulcer History of Present Illness (HPI) 02/12/18 ADMISSION This is an 85 year old woman who is recently moved to [place] from [place]. Her story began in February where she fell on some ice suffering extensive lacerations to her bilateral lower extremities. She is able to show me extensive pictures of the right lower leg but with going to a wound care center and [place] 3 times a week these eventually closed over. She has been left with 1 area on the upper lateral left calf. She has been applying Neosporin to this and applying a bandage. Currently this measures 2 x 1.5 cm. The patient is not a diabetic. She is an ex-smoker quitting 40 years ago. She does have COPD by description. ABIs in our clinic were 1.32 on the right and 1.03 on the left 02/19/18; right lower leg wound which was initially trauma. The  area that we look that last week is smaller. Still covered in a nonviable surface however. We are using Iodoflex 02/26/18; right lower leg wound which was  initially trauma in the setting of chronic venous insufficiency. Surface of the wound looks much better healthy granulation advancing epithelialization. We have good edema control 03/05/18; right lower leg wound which was initially trauma in the setting of chronic venous insufficiency. She continues to make nice progress here. We have good edema control He arrives today with a new traumatic laceration on her right dorsal forearm which she states happened while she was putting on a sweatshirt 03/12/18; right lower leg wound as closed however it still looks vulnerable. She has chronic venous insufficiency. The new wound from last week a traumatic area on her right dorsal or arm unfortunately does not have a viable surface. I remove some nonviable skin and necrotic subcutaneous debris from the wound surface. Hemostasis with silver nitrate and direct pressure 03/19/18; right lower leg wound is closed. She has chronic venous insufficiency and will need ongoing compression The new wounds from 2 weeks ago on the right upper elbow is closed. The area on her right dorsal forearm continues to have a nonviable surface requiring debridement 03/26/18; we'll need to look into ongoing compression for her legs. 2 wounds on the right upper elbow is closed. The area on the right dorsal forearm looks better. Hydrofera Blue secondary to hypertrophic granulation 04/02/18; he has compression stockings although she did not wear them today. Severe chronic venous insufficiency. She has 2 wounds on the right upper elbow which I said were closed last week which actually are although they're very small and she has a smaller clean wound on the right dorsal forearm. We've been using Hydrofera Blue secondary to some upper granulation. All of this looks better 04/09/18-She is seen in  follow up evaluation for a right elbow and right forearm skin tear. The right elbow is healed. We will continue with same treatment plan and she'll follow next week 04/16/18-She is seen in follow-up evaluation for right forearm skin tear, this is essentially healed. We will cover with foam border and she will follow-up next week 04/23/18; the patient arrives with a new skin tear on her right dorsal arm just below the elbow. She got this while carrying a box. She has a linear skin tear. There is no depth I don't think this should've been sutured. There is a skin flap medially I'm not sure if this will be viable or not 04/30/18; the new skin tear from last week unfortunately has remained viable in terms of the skin flap. She has a small open area that looks healthy. Using moistened silver collagen 05/07/18; right dorsal arm skin tear. Nonviable tissue over the remnants of the wound. We've been using silver collagen DAMARI, HILTZ ([id number]) 05/21/18; right dorsal arm skin tear. Nonviable tissue over a small remnant of the wound was washed off. We've been using silver collagen which we will continue 06/04/18; the right dorsal arm skin tear has healed. She arrives today with a traumatic wound just above the olecranon of her left elbow. This is a skin tear with a nonviable nonadherent flap which was removed. We'll use silver collagen here. Also noteworthy she arrives without her compression stockings 06/18/18; the area just above the olecranon of her left elbow. It is smaller and generally has a healthy surface. I was surprised to learn that the patient is actually changing this herself although. Appears this week she was putting some topical lidocaine on this that she bought over-the-counter at the drugstore. She did get supplies at home she didn't know how to put it on. She  drove herself here today i.e. not eligible for home health Readmission: 11/04/18 on evaluation today patient presents for  evaluation our clinic news to include the she has on her lower extremity which occurred as the result of having hit this on a piece of furniture. This was back in December. She states that her daughter has been trying to get her to come into have this evaluated here the wound care center but she was being somewhat stubborn. Nonetheless upon evaluation today the wound really appears to be potentially healed she does have some swelling of lower extremity which I think will come into play. With that being said he tells me that even yesterday this was still making clear fluid and therefore there may be some issues with continued problems with the swelling and drainage secondary to her venous stasis. Nonetheless overall wound appears to be doing fairly well which is good news. 11/11/18 on evaluation today patient actually appears to be doing excellent in the area of question that was green just the day before he saw her last time actually appears to have not drain that always in past week. I think this is done very well and at this point though she's had some discomfort I do not see any evidence of open wound which he needs to continue see wound care. Patient History Information obtained from Patient. Family History Cancer - Siblings,Paternal Grandparents, Diabetes - Siblings, Heart Disease - Mother,Father,Child,Paternal Grandparents, Hypertension - Child, No family history of Hereditary Spherocytosis, Kidney Disease, Lung Disease, Seizures, Stroke, Thyroid Problems, Tuberculosis. Social History Former smoker - quit 40 yrs ago, Marital Status - Divorced, Alcohol Use - Never, Drug Use - No History, Caffeine Use - Rarely. Medical History Eyes Patient has history of Cataracts - surgery 2000, Glaucoma Respiratory Patient has history of Asthma, Chronic Obstructive Pulmonary Disease (COPD) Denies history of Pneumothorax, Sleep Apnea, Tuberculosis Cardiovascular Patient has history of Arrhythmia -  a-fib, Hypertension Denies history of Angina, Congestive Heart Failure, Coronary Artery Disease, Deep Vein Thrombosis, Hypotension, Myocardial Infarction, Peripheral Arterial Disease, Peripheral Venous Disease, Phlebitis, Vasculitis Endocrine Denies history of Type I Diabetes, Type II Diabetes Genitourinary Denies history of End Stage Renal Disease Immunological Denies history of Lupus Erythematosus, Raynaud s, Scleroderma Integumentary (Skin) Denies history of History of Burn, History of pressure wounds Musculoskeletal Denies history of Gout, Rheumatoid Arthritis, Osteoarthritis, Osteomyelitis Neurologic [name], [name] ([id number]) Denies history of Dementia, Neuropathy, Quadriplegia, Paraplegia, Seizure Disorder Oncologic Denies history of Received Chemotherapy, Received Radiation Psychiatric Denies history of Anorexia/bulimia, Confinement Anxiety Hospitalization/Surgery History - 06/26/2018, [facility], Colon cancer. Review of Systems (ROS) Constitutional Symptoms (General Health) Denies complaints or symptoms of Fever, Chills. Psychiatric The patient has no complaints or symptoms. Objective Constitutional Well-nourished and well-hydrated in no acute distress. Vitals Time Taken: 3:24 PM, Height: 62 in, Weight: 92 lbs, BMI: 16.8, Temperature: 97.7 F, Pulse: 63 bpm, Respiratory Rate: 16 breaths/min, Blood Pressure: 141/45 mmHg. Respiratory normal breathing without difficulty. Psychiatric this patient is able to make decisions and demonstrates good insight into disease process. Alert and Oriented x 3. pleasant and cooperative. General Notes: Patient's wound bed again show signs of complete epithelialization will be addressed again I think some lotion would be helpful. Other than that I see no issues currently. Integumentary (Hair, Skin) Wound #6 status is Open. Original cause of wound was Trauma. The wound is located on the Left,Anterior Lower Leg. The wound  measures 0.1cm length x 0.1cm width x 0.1cm depth; 0.008cm^2 area and 0.001cm^3 volume. The wound is  limited to skin breakdown. There is no tunneling or undermining noted. There is a none present amount of drainage noted. The wound margin is indistinct and nonvisible. There is no granulation within the wound bed. There is no necrotic tissue within the wound bed. The periwound skin appearance exhibited: Scarring, Dry/Scaly. The periwound skin appearance did not exhibit: Callus, Crepitus, Excoriation, Induration, Rash, Maceration, Atrophie Blanche, Cyanosis, Ecchymosis, Hemosiderin Staining, Mottled, Pallor, Rubor, Erythema. Assessment Active Problems VICKII, VOLLAND. ([id number]) ICD-10 Venous insufficiency (chronic) (peripheral) Unspecified open wound, left lower leg, initial encounter Plan Discharge From [facility] Services: Wound #6 Left,Anterior Lower Leg: Discharge from [facility] suggest currently that we discontinue wound care services as she is definitely shield there's no drainage and though she's having some discomfort I think this may just be some neuropathy or nerve related type pain. I did advise that she continues to have this to see her primary care provider. Electronic Signature(s) Signed: 11/12/2018 1:14:32 PM By: Worthy Keeler PA-C Entered By: Worthy Keeler on 11/11/2018 15:43:47 [name], Carol Lemmie Wise ([id number]) -------------------------------------------------------------------------------- ROS/PFSH Details Patient Name: [name]. Date of Service: 11/11/2018 3:00 PM Medical Record Number: [id number] Patient Account Number: [id number] Date of Birth/Sex: [birth year] (85 y.o. F) Treating RN: [name] Barman Primary Care Provider: Park Liter Other Clinician: Referring Provider: Park Liter Treating Provider/Extender: [name], Akita Maxim Weeks in Treatment: 1 Information Obtained From Patient Wound History Do you currently  have one or more open woundso Yes How many open wounds do you currently haveo 1 Approximately how long have you had your woundso 3 months How have you been treating your wound(s) until nowo neosporin Has your wound(s) ever healed and then re-openedo No Have you had any lab work done in the past montho No Have you tested positive for an antibiotic resistant organism (MRSA, VRE)o No Have you tested positive for osteomyelitis (bone infection)o No Have you had any tests for circulation on your legso No Constitutional Symptoms (General Health) Complaints and Symptoms: Negative for: Fever; Chills Eyes Medical History: Positive for: Cataracts - surgery 2000; Glaucoma Respiratory Medical History: Positive for: Asthma; Chronic Obstructive Pulmonary Disease (COPD) Negative for: Pneumothorax; Sleep Apnea; Tuberculosis Cardiovascular Medical History: Positive for: Arrhythmia - a-fib; Hypertension Negative for: Angina; Congestive Heart Failure; Coronary Artery Disease; Deep Vein Thrombosis; Hypotension; Myocardial Infarction; Peripheral Arterial Disease; Peripheral Venous Disease; Phlebitis; Vasculitis Endocrine Medical History: Negative for: Type I Diabetes; Type II Diabetes Genitourinary Medical History: Negative for: End Stage Renal Disease Immunological KARCYN, MENN. ([id number]) Medical History: Negative for: Lupus Erythematosus; Raynaudos; Scleroderma Integumentary (Skin) Medical History: Negative for: History of Burn; History of pressure wounds Musculoskeletal Medical History: Negative for: Gout; Rheumatoid Arthritis; Osteoarthritis; Osteomyelitis Neurologic Medical History: Negative for: Dementia; Neuropathy; Quadriplegia; Paraplegia; Seizure Disorder Oncologic Medical History: Negative for: Received Chemotherapy; Received Radiation Psychiatric Complaints and Symptoms: No Complaints or Symptoms Medical History: Negative for: Anorexia/bulimia; Confinement  Anxiety HBO Extended History Items Eyes: Eyes: Cataracts Glaucoma Immunizations Pneumococcal Vaccine: Received Pneumococcal Vaccination: Yes Implantable Devices None Hospitalization / Surgery History Name of Hospital Purpose of Hospitalization/Surgery Date [facility] Colon cancer 06/26/2018 Family and Social History Cancer: Yes - Siblings,Paternal Grandparents; Diabetes: Yes - Siblings; Heart Disease: Yes - Mother,Father,Child,Paternal Grandparents; Hereditary Spherocytosis: No; Hypertension: Yes - Child; Kidney Disease: No; Lung Disease: No; Seizures: No; Stroke: No; Thyroid Problems: No; Tuberculosis: No; Former smoker - quit 40 yrs ago; Marital Status - Divorced; Alcohol Use: Never; Drug Use: No History; Caffeine Use: Rarely; Financial Concerns: No;  Food, Wise developer or Shelter Needs: No; Support System Lacking: No; Transportation Concerns: No; Advanced Directives: No; Patient does not want information on Advanced Directives; Do not resuscitate: No; Living Will: Yes (Not Provided); Medical Power of Attorney: Yes - [name] (daughter) (Not Provided) Physician Affirmation I have reviewed and agree with the above information. HANA, TRIPPETT ([id number]) Electronic Signature(s) Signed: 11/11/2018 6:07:40 PM By: Gretta Cool, BSN, RN, CWS, Kim RN, BSN Signed: 11/12/2018 1:14:32 PM By: Worthy Keeler PA-C Entered By: Worthy Keeler on 11/11/2018 15:43:11 Wolbert, Dhwani HMarland Kitchen ([id number]) -------------------------------------------------------------------------------- SuperBill Details Patient Name: [name] Date of Service: 11/11/2018 Medical Record Number: [id number] Patient Account Number: [id number] Date of Birth/Sex: 1928-06-01 (85 y.o. F) Treating RN: [name] Barman Primary Care Provider: Park Liter Other Clinician: Referring Provider: Park Liter Treating Provider/Extender: [name], Zhi Geier Weeks in Treatment: 1 Diagnosis Coding ICD-10 Codes Code  Description I87.2 Venous insufficiency (chronic) (peripheral) S81.802A Unspecified open wound, left lower leg, initial encounter Facility Procedures CPT4 Code: 82423536 Description: [phone] - WOUND CARE VISIT-LEV 2 EST PT Modifier: Quantity: 1 Physician Procedures CPT4 Code: 5400867 Description: 61950 - WC PHYS LEVEL 2 - EST PT ICD-10 Diagnosis Description I87.2 Venous insufficiency (chronic) (peripheral) S81.802A Unspecified open wound, left lower leg, initial encounter Modifier: Quantity: 1 Electronic Signature(s) Signed: 11/12/2018 1:14:32 PM By: Worthy Keeler PA-C Entered By: Worthy Keeler on 11/11/2018 15:43:57

## 2018-11-16 ENCOUNTER — Emergency Department
Admission: EM | Admit: 2018-11-16 | Discharge: 2018-11-16 | Disposition: A | Discharge status: Home / Self Care | Payer: Medicare Other | Attending: Student in an Organized Health Care Education/Training Program | Admitting: Student in an Organized Health Care Education/Training Program

## 2018-11-16 ENCOUNTER — Other Ambulatory Visit: Payer: Self-pay

## 2018-11-16 ENCOUNTER — Emergency Department: Payer: Medicare Other

## 2018-11-16 DIAGNOSIS — Z79899 Other long term (current) drug therapy: Secondary | ICD-10-CM | POA: Diagnosis not present

## 2018-11-16 DIAGNOSIS — Z87891 Personal history of nicotine dependence: Secondary | ICD-10-CM | POA: Insufficient documentation

## 2018-11-16 DIAGNOSIS — R42 Dizziness and giddiness: Secondary | ICD-10-CM | POA: Diagnosis not present

## 2018-11-16 DIAGNOSIS — E039 Hypothyroidism, unspecified: Secondary | ICD-10-CM | POA: Diagnosis not present

## 2018-11-16 DIAGNOSIS — R918 Other nonspecific abnormal finding of lung field: Secondary | ICD-10-CM | POA: Diagnosis not present

## 2018-11-16 DIAGNOSIS — Z7982 Long term (current) use of aspirin: Secondary | ICD-10-CM | POA: Insufficient documentation

## 2018-11-16 DIAGNOSIS — J449 Chronic obstructive pulmonary disease, unspecified: Secondary | ICD-10-CM | POA: Diagnosis not present

## 2018-11-16 DIAGNOSIS — I10 Essential (primary) hypertension: Secondary | ICD-10-CM | POA: Insufficient documentation

## 2018-11-16 DIAGNOSIS — R9082 White matter disease, unspecified: Secondary | ICD-10-CM | POA: Diagnosis not present

## 2018-11-16 DIAGNOSIS — I4891 Unspecified atrial fibrillation: Secondary | ICD-10-CM | POA: Diagnosis not present

## 2018-11-16 HISTORY — DX: Essential (primary) hypertension: I10

## 2018-11-16 LAB — URINALYSIS, COMPLETE (UACMP) WITH MICROSCOPIC
Bilirubin Urine: NEGATIVE
Glucose, UA: NEGATIVE mg/dL
Hgb urine dipstick: NEGATIVE
Ketones, ur: NEGATIVE mg/dL
Nitrite: NEGATIVE
Protein, ur: NEGATIVE mg/dL
Specific Gravity, Urine: 1.015 (ref 1.005–1.030)
pH: 7 (ref 5.0–8.0)

## 2018-11-16 LAB — BASIC METABOLIC PANEL
Anion gap: 6 (ref 5–15)
BUN: 24 mg/dL — ABNORMAL HIGH (ref 8–23)
CO2: 27 mmol/L (ref 22–32)
Calcium: 9.1 mg/dL (ref 8.9–10.3)
Chloride: 102 mmol/L (ref 98–111)
Creatinine, Ser: 1.21 mg/dL — ABNORMAL HIGH (ref 0.44–1.00)
GFR calc Af Amer: 46 mL/min — ABNORMAL LOW (ref >60)
GFR calc non Af Amer: 40 mL/min — ABNORMAL LOW (ref >60)
Glucose, Bld: 150 mg/dL — ABNORMAL HIGH (ref 70–99)
Potassium: 4.4 mmol/L (ref 3.5–5.1)
Sodium: 135 mmol/L (ref 135–145)

## 2018-11-16 LAB — CBC
HCT: 43.1 % (ref 36.0–46.0)
Hemoglobin: 13.9 g/dL (ref 12.0–15.0)
MCH: 28.4 pg (ref 26.0–34.0)
MCHC: 32.3 g/dL (ref 30.0–36.0)
MCV: 88.1 fL (ref 80.0–100.0)
Platelets: 214 10*3/uL (ref 150–400)
RBC: 4.89 MIL/uL (ref 3.87–5.11)
RDW: 13.5 % (ref 11.5–15.5)
WBC: 7.9 10*3/uL (ref 4.0–10.5)
nRBC: 0 % (ref 0.0–0.2)

## 2018-11-16 MED ORDER — SODIUM CHLORIDE 0.9 % IV BOLUS
500.0000 mL | Freq: Once | INTRAVENOUS | Status: AC
  Administered 2018-11-16: 500 mL via INTRAVENOUS

## 2018-11-16 MED ORDER — CEPHALEXIN 500 MG PO CAPS: 500.0000 mg | ORAL_CAPSULE | Freq: Three times a day (TID) | ORAL | 0 refills | Status: AC

## 2018-11-16 MED ORDER — MECLIZINE HCL 25 MG PO TABS
12.5000 mg | ORAL_TABLET | Freq: Once | ORAL | Status: AC
  Administered 2018-11-16: 12.5 mg via ORAL
  Filled 2018-11-16: qty 1

## 2018-11-16 NOTE — ED Notes (Signed)
Pt able to walk with assistance to restroom. Pt reports dizziness but does not have trouble staying up right or walking.

## 2018-11-16 NOTE — ED Notes (Signed)
Patient transported to MRI 

## 2018-11-16 NOTE — ED Notes (Signed)
Patient transported to CT 

## 2018-11-16 NOTE — ED Notes (Signed)
Family has spoken to MRI. Waiting on MRI. Pt resting at this time.

## 2018-11-16 NOTE — ED Provider Notes (Signed)
[facility] Emergency Department Provider Note    First MD Initiated Contact with Patient 11/16/18 1140     (approximate)  I have reviewed the triage vital signs and the nursing notes.   HISTORY  Chief Complaint Dizziness    HPI [name] is a 85 y.o. female below listed past medical history presents the ER for evaluation of dizziness.  Dizziness is particularly worse when she stands up.  Feels like she is going to fall over and feels lightheaded but also feels like the room is spinning around.  Is never been diagnosed with vertigo but has had symptoms similar to this in the past.  Denies any history of stroke.  Has not had any mechanical fall or head injury.  According to family she does not eat or drink much and feels dehydrated.  No recent fevers.    Past Medical History:  Diagnosis Date  . Anxiety   . Atrial fibrillation ([place])   . Clotting disorder (HCC)    left ankle  . COPD (chronic obstructive pulmonary disease) ([place])   . Depression   . Hypertension   . Melena 06/22/2018  . Obstructive chronic bronchitis without exacerbation ([place]) 12/18/2016  . Overactive bladder   . Thyroid disease    Family History  Problem Relation Age of Onset  . Heart disease Mother   . Heart failure Mother   . Parkinson's disease Father   . Heart disease Sister   . Heart disease Son    Past Surgical History:  Procedure Laterality Date  . ABDOMINAL HYSTERECTOMY    . BREAST LUMPECTOMY    . COLONOSCOPY Left 06/23/2018   Procedure: COLONOSCOPY;  Surgeon: Jonathon Bellows, MD;  Location: [facility] ENDOSCOPY;  Service: Gastroenterology;  Laterality: Left;  Colonoscopy first. Dr. Vicente Males will decide if EGD is needed after colonoscopy  . ESOPHAGOGASTRODUODENOSCOPY Left 06/23/2018   Procedure: ESOPHAGOGASTRODUODENOSCOPY (EGD);  Surgeon: Jonathon Bellows, MD;  Location: [facility] ENDOSCOPY;  Service: Gastroenterology;  Laterality: Left;  Colonoscopy first. Dr. Vicente Males will decide if EGD is  needed after colonoscopy  . LAPAROSCOPIC RIGHT COLECTOMY Right 06/26/2018   Procedure: LAPAROSCOPIC RIGHT COLECTOMY;  Surgeon: Olean Ree, MD;  Location: [facility] ORS;  Service: General;  Laterality: Right;   Patient Active Problem List   Diagnosis Date Noted  . Urge incontinence 09/01/2018  . History of colon cancer 07/24/2018  . Iron deficiency anemia due to chronic blood loss 07/24/2018  . Protein-calorie malnutrition ([place]) 06/19/2018  . Senile purpura ([place]) 06/19/2018  . Anxiety 04/28/2018  . Cataract of both eyes 04/28/2018  . Diverticulosis 04/28/2018  . DJD (degenerative joint disease) 04/28/2018  . Oxygen dependent 02/07/2018  . COPD (chronic obstructive pulmonary disease) ([place])   . Depression   . Overactive bladder   . Hypothyroidism   . History of fall 08/13/2017  . Open wound of left lower leg 08/13/2017  . Renal artery stenosis ([place]) 12/18/2016  . Risk for falls 06/19/2016  . Chronic respiratory failure with hypoxia ([place]) 10/25/2015  . History of DVT (deep vein thrombosis) 10/11/2014  . PSVT (paroxysmal supraventricular tachycardia) ([place]) 10/22/2013  . Essential hypertension 12/20/2011  . PAF (paroxysmal atrial fibrillation) ([place]) 08/22/2011      Prior to Admission medications   Medication Sig Start Date End Date Taking? Authorizing Provider  acetaminophen (TYLENOL) 325 MG tablet Take 2 tablets (650 mg total) by mouth every 6 (six) hours as needed for mild pain or fever. 07/01/18   Olean Ree, MD  amitriptyline (ELAVIL) 25  MG tablet Take 1 tablet (25 mg total) by mouth at bedtime. Patient not taking: Reported on 09/01/2018 07/24/18   Park Liter P, DO  amLODipine (NORVASC) 2.5 MG tablet Take 1 tablet (2.5 mg total) by mouth daily. 07/24/18   Johnson, Megan P, DO  aspirin EC 81 MG tablet Take 1 tablet (81 mg total) by mouth daily. 05/19/18   Johnson, Megan P, DO  cephALEXin (KEFLEX) 500 MG capsule Take 1 capsule (500 mg total) by mouth 3 (three) times daily for  7 days. 11/16/18 11/23/18  Merlyn Lot, MD  feeding supplement, ENSURE ENLIVE, (ENSURE ENLIVE) LIQD Take 237 mLs by mouth 2 (two) times daily between meals. 07/01/18   Olean Ree, MD  ferrous sulfate 325 (65 FE) MG tablet Take 1 tablet (325 mg total) by mouth 3 (three) times daily with meals. 06/19/18   Johnson, Megan P, DO  fluticasone (FLONASE) 50 MCG/ACT nasal spray Place 2 sprays into both nostrils daily. 08/06/18   Laverle Hobby, MD  Fluticasone-Salmeterol (ADVAIR DISKUS) 250-50 MCG/DOSE AEPB Inhale 1 puff into the lungs daily. Rinse mouth after use. 04/28/18 04/28/19  Laverle Hobby, MD  levothyroxine (SYNTHROID, LEVOTHROID) 88 MCG tablet TAKE 1 TABLET BY MOUTH ONCE DAILY BEFORE BREAKFAST 11/10/18   Johnson, Megan P, DO  lidocaine (LIDODERM) 5 % Place 1 patch onto the skin 2 (two) times daily. Remove & Discard patch within 12 hours or as directed by MD Patient not taking: Reported on 09/01/2018 07/24/18   Park Liter P, DO  metoprolol tartrate (LOPRESSOR) 50 MG tablet Take 1 tablet (50 mg total) by mouth 2 (two) times daily. 07/24/18   Johnson, Megan P, DO  multivitamin-lutein (OCUVITE-LUTEIN) CAPS capsule Take 1 capsule by mouth daily. 07/02/18   Olean Ree, MD  OXYGEN Inhale 2 L into the lungs. As needed during the day, and full use at bedtime    [provider]  solifenacin (VESICARE) 10 MG tablet Take 1 tablet (10 mg total) by mouth daily. 09/30/18   Valerie Roys, DO    Allergies Patient has no known allergies.    Social History Social History   Tobacco Use  . Smoking status: Former Smoker    Years: 25.00    Types: Cigarettes    Last attempt to quit: 02/01/1986    Years since quitting: 32.8  . Smokeless tobacco: Never Used  . Tobacco comment: quit in 1987   Substance Use Topics  . Alcohol use: Never    Frequency: Never  . Drug use: Never    Review of Systems Patient denies headaches, rhinorrhea, blurry vision, numbness, shortness of  breath, chest pain, edema, cough, abdominal pain, nausea, vomiting, diarrhea, dysuria, fevers, rashes or hallucinations unless otherwise stated above in HPI. ____________________________________________   PHYSICAL EXAM:  VITAL SIGNS: Vitals:   11/16/18 1400 11/16/18 1430  BP: (!) 151/64 (!) 155/59  Pulse: 70 71  Resp: 14 14  Temp:    SpO2: 96% 96%    Constitutional: Alert and oriented.  Eyes: Conjunctivae are normal.  Head: Atraumatic. Nose: No congestion/rhinnorhea. Mouth/Throat: Mucous membranes are moist.   Neck: No stridor. Painless ROM.  Cardiovascular: Normal rate, regular rhythm. Grossly normal heart sounds.  Good peripheral circulation. Respiratory: Normal respiratory effort.  No retractions. Lungs CTAB. Gastrointestinal: Soft and nontender. No distention. No abdominal bruits. No CVA tenderness. Genitourinary:  Musculoskeletal: No lower extremity tenderness nor edema.  No joint effusions. Neurologic: CN- intact.  No facial droop, Normal FNF.  Normal heel to shin.  Sensation intact  bilaterally. Normal speech and language. No gross focal neurologic deficits are appreciated. No gait instability. Skin:  Skin is warm, dry and intact. No rash noted. Psychiatric: Mood and affect are normal. Speech and behavior are normal.  ____________________________________________   LABS (all labs ordered are listed, but only abnormal results are displayed)  Results for orders placed or performed during the hospital encounter of 11/16/18 (from the past 24 hour(s))  Basic metabolic panel     Status: Abnormal   Collection Time: 11/16/18 11:35 AM  Result Value Ref Range   Sodium 135 135 - 145 mmol/L   Potassium 4.4 3.5 - 5.1 mmol/L   Chloride 102 98 - 111 mmol/L   CO2 27 22 - 32 mmol/L   Glucose, Bld 150 (H) 70 - 99 mg/dL   BUN 24 (H) 8 - 23 mg/dL   Creatinine, Ser 1.21 (H) 0.44 - 1.00 mg/dL   Calcium 9.1 8.9 - 10.3 mg/dL   GFR calc non Af Amer 40 (L) >60 mL/min   GFR calc Af Amer 46  (L) >60 mL/min   Anion gap 6 5 - 15  CBC     Status: None   Collection Time: 11/16/18 11:35 AM  Result Value Ref Range   WBC 7.9 4.0 - 10.5 K/uL   RBC 4.89 3.87 - 5.11 MIL/uL   Hemoglobin 13.9 12.0 - 15.0 g/dL   HCT 43.1 36.0 - 46.0 %   MCV 88.1 80.0 - 100.0 fL   MCH 28.4 26.0 - 34.0 pg   MCHC 32.3 30.0 - 36.0 g/dL   RDW 13.5 11.5 - 15.5 %   Platelets 214 150 - 400 K/uL   nRBC 0.0 0.0 - 0.2 %  Urinalysis, Complete w Microscopic     Status: Abnormal   Collection Time: 11/16/18 11:54 AM  Result Value Ref Range   Color, Urine YELLOW (A) YELLOW   APPearance HAZY (A) CLEAR   Specific Gravity, Urine 1.015 1.005 - 1.030   pH 7.0 5.0 - 8.0   Glucose, UA NEGATIVE NEGATIVE mg/dL   Hgb urine dipstick NEGATIVE NEGATIVE   Bilirubin Urine NEGATIVE NEGATIVE   Ketones, ur NEGATIVE NEGATIVE mg/dL   Protein, ur NEGATIVE NEGATIVE mg/dL   Nitrite NEGATIVE NEGATIVE   Leukocytes,Ua LARGE (A) NEGATIVE   RBC / HPF 11-20 0 - 5 RBC/hpf   WBC, UA 21-50 0 - 5 WBC/hpf   Bacteria, UA RARE (A) NONE SEEN   Squamous Epithelial / LPF 0-5 0 - 5   ____________________________________________  EKG My review and personal interpretation at Time: 11:35   Indication: dizziness  Rate: 95  Rhythm: afib Axis: normal Other: baseline artifact, nonspecific st abn ____________________________________________  RADIOLOGY  I personally reviewed all radiographic images ordered to evaluate for the above acute complaints and reviewed radiology reports and findings.  These findings were personally discussed with the patient.  Please see medical record for radiology report.  ____________________________________________   PROCEDURES  Procedure(s) performed:  Procedures    Critical Care performed: no ____________________________________________   INITIAL IMPRESSION / ASSESSMENT AND PLAN / ED COURSE  Pertinent labs & imaging results that were available during my care of the patient were reviewed by me and  considered in my medical decision making (see chart for details).   DDX: vertigo, dehydration, anemia, electrolyte abn, dysrhythmia, cva  MAJORIE SANTEE is a 85 y.o. who presents to the ED with was as described above.  Patient nontoxic-appearing.  Fairly asymptomatic while laying still.  Does not seem clinically  consistent with CVA but as she is reporting significant unsteadiness with fall will order CT imaging but to exclude mass, CVA and subdural.  Clinically appears dehydrated.  BUN is elevated.  We will give IV hydration as this is likely contributing.  Clinical Course as of Nov 16 1607  Sun Nov 16, 2018  1607 No evidence of CVA on MRI.  Patient without any focal deficits on repeat exam.  Family requesting discharge home right now which I think is reasonable.  Discussed signs and symptoms which she should return to the ER.  Will send for urine culture and also start her on Keflex for possible component of cystitis.   [PR]    Clinical Course User Index [PR] Merlyn Lot, MD     As part of my medical decision making, I reviewed the following data within the [place] notes reviewed and incorporated, Labs reviewed, notes from prior ED visits and [place] Controlled Substance Database   ____________________________________________   FINAL CLINICAL IMPRESSION(S) / ED DIAGNOSES  Final diagnoses:  Dizziness      NEW MEDICATIONS STARTED DURING THIS VISIT:  New Prescriptions   CEPHALEXIN (KEFLEX) 500 MG CAPSULE    Take 1 capsule (500 mg total) by mouth 3 (three) times daily for 7 days.     Note:  This document was prepared using Dragon voice recognition software and may include unintentional dictation errors.    Merlyn Lot, MD 11/16/18 [phone]

## 2018-11-16 NOTE — ED Triage Notes (Signed)
Pt arrives [facility] from [facility] (room 510) for dizzy that began this AM. Worse upon standing. Some weakness. Has had dizzy spells before but don't usually last this long. Wears oxygen as needed during day. Denies SOB or needing oxygen.   Pt is HOH. 177/73, HR 60 a fib, 96% RA. CBG 206 arrives 18 R AC. 97.6 oral.

## 2018-11-20 DIAGNOSIS — H401131 Primary open-angle glaucoma, bilateral, mild stage: Secondary | ICD-10-CM | POA: Diagnosis not present

## 2018-11-25 ENCOUNTER — Ambulatory Visit: Payer: Medicare Other | Admitting: Family Medicine

## 2018-11-25 ENCOUNTER — Ambulatory Visit (INDEPENDENT_AMBULATORY_CARE_PROVIDER_SITE_OTHER): Payer: Medicare Other | Admitting: Family Medicine

## 2018-11-25 ENCOUNTER — Encounter: Payer: Self-pay | Admitting: Family Medicine

## 2018-11-25 ENCOUNTER — Other Ambulatory Visit: Payer: Self-pay

## 2018-11-25 VITALS — Temp 97.8°F

## 2018-11-25 DIAGNOSIS — R42 Dizziness and giddiness: Secondary | ICD-10-CM | POA: Diagnosis not present

## 2018-11-25 DIAGNOSIS — R519 Headache, unspecified: Secondary | ICD-10-CM

## 2018-11-25 DIAGNOSIS — R51 Headache: Secondary | ICD-10-CM

## 2018-11-25 DIAGNOSIS — E039 Hypothyroidism, unspecified: Secondary | ICD-10-CM

## 2018-11-25 NOTE — Progress Notes (Signed)
Temp 97.8 F (36.6 C) (Oral)   SpO2 97%    Subjective:    Patient ID: [name], female    DOB: [birth year], 85 y.o.   MRN: [id number]  HPI: [name] is a 85 y.o. female  Chief Complaint  Patient presents with  . Hospitalization Follow-up  . Dizziness    Worse when laying down. Extreme pain in her head when laying down.     Has had severe pain in her head for the past 3 weeks- feels like wheels grinding in her head. Wakes up with it, gets better about noon and then feels better as the day goes on. She does note that any time she lays down, it tends to get worse. She states that it is the worst headache of her life. She went to the ER on 11/16/18 for evaluation and had an MRI and CT which were both normal.   DIZZINESS Duration: 3 weeks Description of symptoms: "inside of her head is spinning" Duration of episode: until she changes positions Dizziness frequency: recurrent Provoking factors: rolling over in bed Triggered by rolling over in bed: yes Triggered by bending over: yes Aggravated by head movement: yes Aggravated by exertion, coughing, loud noises: no Recent head injury: no Recent or current viral symptoms: 3 weeks ago History of vasovagal episodes: no Nausea: no Vomiting: no Tinnitus: no Hearing loss: no changes Aural fullness: no Headache: yes Photophobia/phonophobia: no Unsteady gait: yes Postural instability: yes Diplopia, dysarthria, dysphagia or weakness: no Related to exertion: no Pallor: no Diaphoresis: no Dyspnea: no Chest pain: no   Relevant past medical, surgical, family and social history reviewed and updated as indicated. Interim medical history since our last visit reviewed. Allergies and medications reviewed and updated.  Review of Systems  Constitutional: Negative.   HENT: Negative.   Respiratory: Negative.   Cardiovascular: Negative.   Gastrointestinal: Negative.   Skin: Negative.   Neurological: Positive for  dizziness, light-headedness and headaches. Negative for tremors, seizures, syncope, facial asymmetry, speech difficulty, weakness and numbness.  Psychiatric/Behavioral: Negative.     Per HPI unless specifically indicated above     Objective:    Temp 97.8 F (36.6 C) (Oral)   SpO2 97%   Wt Readings from Last 3 Encounters:  11/16/18 90 lb (40.8 kg)  09/30/18 93 lb (42.2 kg)  09/01/18 93 lb 6.4 oz (42.4 kg)    Orthostatic VS for the past 24 hrs:  BP- Lying Pulse- Lying BP- Sitting Pulse- Sitting BP- Standing at 0 minutes Pulse- Standing at 0 minutes  11/25/18 1431 154/65 62 131/68 67 126/67 68    Physical Exam Vitals signs and nursing note reviewed.  Constitutional:      General: She is not in acute distress.    Appearance: Normal appearance. She is not ill-appearing, toxic-appearing or diaphoretic.  HENT:     Head: Normocephalic and atraumatic.     Right Ear: Tympanic membrane, ear canal and external ear normal. There is impacted cerumen (dry wax- not touching TM, TM normal behind it).     Left Ear: Tympanic membrane, ear canal and external ear normal. There is no impacted cerumen.     Nose: Nose normal. No congestion or rhinorrhea.     Mouth/Throat:     Mouth: Mucous membranes are moist.     Pharynx: Oropharynx is clear. No oropharyngeal exudate or posterior oropharyngeal erythema.  Eyes:     General: No scleral icterus.       Right eye: No  discharge.        Left eye: No discharge.     Extraocular Movements: Extraocular movements intact.     Right eye: Normal extraocular motion and no nystagmus.     Left eye: Normal extraocular motion and no nystagmus.     Conjunctiva/sclera: Conjunctivae normal.     Pupils: Pupils are equal, round, and reactive to light.  Neck:     Musculoskeletal: Normal range of motion and neck supple. No neck rigidity or muscular tenderness.     Vascular: No carotid bruit.     Comments: VERY tight paraspinal muscles, no tenderness on palpation. No  tenderness on palpation of her head. Nothing reproduces her pain  Cardiovascular:     Rate and Rhythm: Normal rate and regular rhythm.     Pulses: Normal pulses.     Heart sounds: Normal heart sounds. No murmur. No friction rub. No gallop.   Pulmonary:     Effort: Pulmonary effort is normal. No respiratory distress.     Breath sounds: Normal breath sounds. No stridor. No wheezing, rhonchi or rales.  Chest:     Chest wall: No tenderness.  Musculoskeletal: Normal range of motion.  Lymphadenopathy:     Cervical: No cervical adenopathy.  Skin:    General: Skin is warm and dry.     Capillary Refill: Capillary refill takes less than 2 seconds.     Coloration: Skin is not jaundiced or pale.     Findings: No bruising, erythema, lesion or rash.  Neurological:     General: No focal deficit present.     Mental Status: She is alert and oriented to person, place, and time. Mental status is at baseline.     Cranial Nerves: No cranial nerve deficit.     Sensory: No sensory deficit.     Motor: No weakness.     Coordination: Coordination normal.     Gait: Gait normal.     Deep Tendon Reflexes: Reflexes normal.  Psychiatric:        Mood and Affect: Mood normal.        Behavior: Behavior normal.        Thought Content: Thought content normal.        Judgment: Judgment normal.     Results for orders placed or performed during the hospital encounter of 56/43/32  Basic metabolic panel  Result Value Ref Range   Sodium 135 135 - 145 mmol/L   Potassium 4.4 3.5 - 5.1 mmol/L   Chloride 102 98 - 111 mmol/L   CO2 27 22 - 32 mmol/L   Glucose, Bld 150 (H) 70 - 99 mg/dL   BUN 24 (H) 8 - 23 mg/dL   Creatinine, Ser 1.21 (H) 0.44 - 1.00 mg/dL   Calcium 9.1 8.9 - 10.3 mg/dL   GFR calc non Af Amer 40 (L) >60 mL/min   GFR calc Af Amer 46 (L) >60 mL/min   Anion gap 6 5 - 15  CBC  Result Value Ref Range   WBC 7.9 4.0 - 10.5 K/uL   RBC 4.89 3.87 - 5.11 MIL/uL   Hemoglobin 13.9 12.0 - 15.0 g/dL   HCT  43.1 36.0 - 46.0 %   MCV 88.1 80.0 - 100.0 fL   MCH 28.4 26.0 - 34.0 pg   MCHC 32.3 30.0 - 36.0 g/dL   RDW 13.5 11.5 - 15.5 %   Platelets 214 150 - 400 K/uL   nRBC 0.0 0.0 - 0.2 %  Urinalysis, Complete w Microscopic  Result Value Ref Range   Color, Urine YELLOW (A) YELLOW   APPearance HAZY (A) CLEAR   Specific Gravity, Urine 1.015 1.005 - 1.030   pH 7.0 5.0 - 8.0   Glucose, UA NEGATIVE NEGATIVE mg/dL   Hgb urine dipstick NEGATIVE NEGATIVE   Bilirubin Urine NEGATIVE NEGATIVE   Ketones, ur NEGATIVE NEGATIVE mg/dL   Protein, ur NEGATIVE NEGATIVE mg/dL   Nitrite NEGATIVE NEGATIVE   Leukocytes,Ua LARGE (A) NEGATIVE   RBC / HPF 11-20 0 - 5 RBC/hpf   WBC, UA 21-50 0 - 5 WBC/hpf   Bacteria, UA RARE (A) NONE SEEN   Squamous Epithelial / LPF 0-5 0 - 5      Assessment & Plan:   Problem List Items Addressed This Visit      Endocrine   Hypothyroidism    Over treated last check 4 months ago. Rechecking today. Will adjust dose as needed. Call with any concerns.       Relevant Orders   TSH    Other Visit Diagnoses    Dizziness    -  Primary   No nystagmus, but will start treatment for vertigo with Epley's manuvers and meclizine. Given severity of HA- will get her into Neuro. Call with concerns.    Relevant Orders   Comprehensive metabolic panel   CBC with Differential/Platelet   Ambulatory referral to Neurology   Intractable episodic headache, unspecified headache type       ?DJD in her neck, however not reproducible. Cannot obtain x-ray due to COVID- will get her into neurology given severity. Warning signs discussed.    Relevant Orders   Ambulatory referral to Neurology       Follow up plan: Return in about 4 weeks (around 12/23/2018) for follow up headache.

## 2018-11-25 NOTE — Assessment & Plan Note (Signed)
Over treated last check 4 months ago. Rechecking today. Will adjust dose as needed. Call with any concerns.

## 2018-11-26 ENCOUNTER — Telehealth: Payer: Self-pay | Admitting: Family Medicine

## 2018-11-26 LAB — CBC WITH DIFFERENTIAL/PLATELET
Basophils Absolute: 0 10*3/uL (ref 0.0–0.2)
Basos: 1 %
EOS (ABSOLUTE): 0.2 10*3/uL (ref 0.0–0.4)
Eos: 3 %
Hematocrit: 40.2 % (ref 34.0–46.6)
Hemoglobin: 14 g/dL (ref 11.1–15.9)
Immature Grans (Abs): 0 10*3/uL (ref 0.0–0.1)
Immature Granulocytes: 0 %
Lymphocytes Absolute: 2 10*3/uL (ref 0.7–3.1)
Lymphs: 25 %
MCH: 28.6 pg (ref 26.6–33.0)
MCHC: 34.8 g/dL (ref 31.5–35.7)
MCV: 82 fL (ref 79–97)
Monocytes Absolute: 0.7 10*3/uL (ref 0.1–0.9)
Monocytes: 10 %
Neutrophils Absolute: 4.7 10*3/uL (ref 1.4–7.0)
Neutrophils: 61 %
Platelets: 233 10*3/uL (ref 150–450)
RBC: 4.89 x10E6/uL (ref 3.77–5.28)
RDW: 13 % (ref 11.7–15.4)
WBC: 7.7 10*3/uL (ref 3.4–10.8)

## 2018-11-26 LAB — COMPREHENSIVE METABOLIC PANEL
ALT: 10 IU/L (ref 0–32)
AST: 20 IU/L (ref 0–40)
Albumin/Globulin Ratio: 1.6 (ref 1.2–2.2)
Albumin: 4.1 g/dL (ref 3.6–4.6)
Alkaline Phosphatase: 96 IU/L (ref 39–117)
BUN/Creatinine Ratio: 18 (ref 12–28)
BUN: 21 mg/dL (ref 8–27)
Bilirubin Total: 0.3 mg/dL (ref 0.0–1.2)
CO2: 26 mmol/L (ref 20–29)
Calcium: 9.5 mg/dL (ref 8.7–10.3)
Chloride: 97 mmol/L (ref 96–106)
Creatinine, Ser: 1.15 mg/dL — ABNORMAL HIGH (ref 0.57–1.00)
GFR calc Af Amer: 49 mL/min/{1.73_m2} — ABNORMAL LOW (ref >59)
GFR calc non Af Amer: 43 mL/min/{1.73_m2} — ABNORMAL LOW (ref >59)
Globulin, Total: 2.5 g/dL (ref 1.5–4.5)
Glucose: 95 mg/dL (ref 65–99)
Potassium: 5 mmol/L (ref 3.5–5.2)
Sodium: 137 mmol/L (ref 134–144)
Total Protein: 6.6 g/dL (ref 6.0–8.5)

## 2018-11-26 LAB — TSH: TSH: 2.94 u[IU]/mL (ref 0.450–4.500)

## 2018-11-26 MED ORDER — LEVOTHYROXINE SODIUM 88 MCG PO TABS: ORAL_TABLET | ORAL | 3 refills | Status: DC

## 2018-11-26 NOTE — Telephone Encounter (Signed)
Please let her know that her labs look nice and normal. I've sent her thyroid medicine over to her pharmacy. Thanks!

## 2018-11-26 NOTE — Telephone Encounter (Signed)
Patient notified

## 2018-12-03 ENCOUNTER — Telehealth: Payer: Self-pay | Admitting: Family Medicine

## 2018-12-03 DIAGNOSIS — R42 Dizziness and giddiness: Secondary | ICD-10-CM | POA: Insufficient documentation

## 2018-12-03 NOTE — Telephone Encounter (Signed)
Copied from [place] [id number]. Topic: Appointment Scheduling - Scheduling Inquiry for Clinic >> Dec 02, 2018  5:03 PM [name] Pines A wrote: Reason for CRM: [name] called in and stated that the patient is requesting her ear to be cleaned out and wants to schedule an appointment for this. I informed patient that someone will be in contact with her once the office reopened. Please advise.

## 2018-12-03 NOTE — Telephone Encounter (Signed)
She can see me Monday or Tuesday or she can see [name] or [place] before that.

## 2018-12-08 ENCOUNTER — Encounter: Payer: Self-pay | Admitting: Family Medicine

## 2018-12-08 ENCOUNTER — Ambulatory Visit (INDEPENDENT_AMBULATORY_CARE_PROVIDER_SITE_OTHER): Payer: Medicare Other | Admitting: Family Medicine

## 2018-12-08 ENCOUNTER — Other Ambulatory Visit: Payer: Self-pay

## 2018-12-08 VITALS — BP 112/68 | HR 71 | Temp 97.9°F | Ht 60.0 in | Wt 93.0 lb

## 2018-12-08 DIAGNOSIS — J449 Chronic obstructive pulmonary disease, unspecified: Secondary | ICD-10-CM | POA: Diagnosis not present

## 2018-12-08 DIAGNOSIS — H6121 Impacted cerumen, right ear: Secondary | ICD-10-CM

## 2018-12-08 DIAGNOSIS — R42 Dizziness and giddiness: Secondary | ICD-10-CM | POA: Diagnosis not present

## 2018-12-08 DIAGNOSIS — J432 Centrilobular emphysema: Secondary | ICD-10-CM | POA: Diagnosis not present

## 2018-12-08 MED ORDER — FLUTICASONE PROPIONATE 50 MCG/ACT NA SUSP: 2.0000 | Freq: Every day | NASAL | 6 refills | Status: DC

## 2018-12-08 MED ORDER — FLUTICASONE-SALMETEROL 250-50 MCG/DOSE IN AEPB: 1.0000 | INHALATION_SPRAY | Freq: Every day | RESPIRATORY_TRACT | 6 refills | Status: DC

## 2018-12-08 NOTE — Progress Notes (Signed)
BP 112/68   Pulse 71   Temp 97.9 F (36.6 C) (Oral)   Ht 5' (1.524 m)   Wt 93 lb (42.2 kg)   SpO2 95%   BMI 18.16 kg/m    Subjective:    Patient ID: [name], female    DOB: 01-27-1928, 85 y.o.   MRN: [id number]  HPI: [name] is a 85 y.o. female  Chief Complaint  Patient presents with  . Ear check    right side. pt states that feels like there is some earwax build up  . Medication Refill    fliticasone/discus   EAG CLOGGED Duration: 3weeks Involved ear(s):  "right Sensation of feeling clogged/plugged: yes Decreased/muffled hearing:yes Ear pain: no Fever: no Otorrhea: no Hearing loss: yes Upper respiratory infection symptoms: no Using Q-Tips: no Status: stable History of cerumenosis: yes Treatments attempted: none   Saw Dr. Melrose Nakayama last week. He gave her something for the dizziness, diagnosed her with vertigo. Want to PT- but need to wait until COVID-19 is over. No other concerns.   Relevant past medical, surgical, family and social history reviewed and updated as indicated. Interim medical history since our last visit reviewed. Allergies and medications reviewed and updated.  Review of Systems  Constitutional: Negative.   HENT: Positive for hearing loss. Negative for congestion, dental problem, drooling, ear discharge, ear pain, facial swelling, mouth sores, nosebleeds, postnasal drip, rhinorrhea, sinus pressure, sinus pain, sneezing, sore throat, tinnitus, trouble swallowing and voice change.   Respiratory: Negative.   Cardiovascular: Negative.   Neurological: Positive for dizziness and headaches. Negative for tremors, seizures, syncope, facial asymmetry, speech difficulty, weakness, light-headedness and numbness.  Hematological: Negative.   Psychiatric/Behavioral: Negative.     Per HPI unless specifically indicated above     Objective:    BP 112/68   Pulse 71   Temp 97.9 F (36.6 C) (Oral)   Ht 5' (1.524 m)   Wt 93 lb (42.2 kg)    SpO2 95%   BMI 18.16 kg/m   Wt Readings from Last 3 Encounters:  12/08/18 93 lb (42.2 kg)  11/16/18 90 lb (40.8 kg)  09/30/18 93 lb (42.2 kg)    Physical Exam Vitals signs and nursing note reviewed.  Constitutional:      General: She is not in acute distress.    Appearance: Normal appearance. She is not ill-appearing, toxic-appearing or diaphoretic.  HENT:     Head: Normocephalic and atraumatic.     Right Ear: Tympanic membrane, ear canal and external ear normal. There is impacted cerumen (mild amount of dry impacted cerumen around the outside- able to see TM, clear).     Left Ear: Tympanic membrane, ear canal and external ear normal. There is no impacted cerumen.     Nose: Nose normal. No congestion or rhinorrhea.     Mouth/Throat:     Mouth: Mucous membranes are moist.     Pharynx: Oropharynx is clear. No oropharyngeal exudate or posterior oropharyngeal erythema.  Eyes:     General: No scleral icterus.       Right eye: No discharge.        Left eye: No discharge.     Extraocular Movements: Extraocular movements intact.     Conjunctiva/sclera: Conjunctivae normal.     Pupils: Pupils are equal, round, and reactive to light.  Neck:     Musculoskeletal: Normal range of motion and neck supple. No neck rigidity or muscular tenderness.     Vascular: No carotid bruit.  Cardiovascular:     Rate and Rhythm: Normal rate and regular rhythm.     Pulses: Normal pulses.     Heart sounds: Normal heart sounds. No murmur. No friction rub. No gallop.   Pulmonary:     Effort: Pulmonary effort is normal. No respiratory distress.     Breath sounds: Normal breath sounds. No stridor. No wheezing, rhonchi or rales.  Chest:     Chest wall: No tenderness.  Musculoskeletal: Normal range of motion.  Lymphadenopathy:     Cervical: No cervical adenopathy.  Skin:    General: Skin is warm and dry.     Capillary Refill: Capillary refill takes less than 2 seconds.     Coloration: Skin is not jaundiced  or pale.     Findings: No bruising, erythema, lesion or rash.  Neurological:     General: No focal deficit present.     Mental Status: She is alert and oriented to person, place, and time. Mental status is at baseline.     Cranial Nerves: No cranial nerve deficit.     Sensory: No sensory deficit.     Motor: No weakness.     Coordination: Coordination normal.     Gait: Gait normal.     Deep Tendon Reflexes: Reflexes normal.  Psychiatric:        Mood and Affect: Mood normal.        Behavior: Behavior normal.        Thought Content: Thought content normal.        Judgment: Judgment normal.     Results for orders placed or performed in visit on 11/25/18  Comprehensive metabolic panel  Result Value Ref Range   Glucose 95 65 - 99 mg/dL   BUN 21 8 - 27 mg/dL   Creatinine, Ser 1.15 (H) 0.57 - 1.00 mg/dL   GFR calc non Af Amer 43 (L) >59 mL/min/1.73   GFR calc Af Amer 49 (L) >59 mL/min/1.73   BUN/Creatinine Ratio 18 12 - 28   Sodium 137 134 - 144 mmol/L   Potassium 5.0 3.5 - 5.2 mmol/L   Chloride 97 96 - 106 mmol/L   CO2 26 20 - 29 mmol/L   Calcium 9.5 8.7 - 10.3 mg/dL   Total Protein 6.6 6.0 - 8.5 g/dL   Albumin 4.1 3.6 - 4.6 g/dL   Globulin, Total 2.5 1.5 - 4.5 g/dL   Albumin/Globulin Ratio 1.6 1.2 - 2.2   Bilirubin Total 0.3 0.0 - 1.2 mg/dL   Alkaline Phosphatase 96 39 - 117 IU/L   AST 20 0 - 40 IU/L   ALT 10 0 - 32 IU/L  CBC with Differential/Platelet  Result Value Ref Range   WBC 7.7 3.4 - 10.8 x10E3/uL   RBC 4.89 3.77 - 5.28 x10E6/uL   Hemoglobin 14.0 11.1 - 15.9 g/dL   Hematocrit 40.2 34.0 - 46.6 %   MCV 82 79 - 97 fL   MCH 28.6 26.6 - 33.0 pg   MCHC 34.8 31.5 - 35.7 g/dL   RDW 13.0 11.7 - 15.4 %   Platelets 233 150 - 450 x10E3/uL   Neutrophils 61 Not Estab. %   Lymphs 25 Not Estab. %   Monocytes 10 Not Estab. %   Eos 3 Not Estab. %   Basos 1 Not Estab. %   Neutrophils Absolute 4.7 1.4 - 7.0 x10E3/uL   Lymphocytes Absolute 2.0 0.7 - 3.1 x10E3/uL   Monocytes  Absolute 0.7 0.1 - 0.9 x10E3/uL   EOS (ABSOLUTE)  0.2 0.0 - 0.4 x10E3/uL   Basophils Absolute 0.0 0.0 - 0.2 x10E3/uL   Immature Granulocytes 0 Not Estab. %   Immature Grans (Abs) 0.0 0.0 - 0.1 x10E3/uL  TSH  Result Value Ref Range   TSH 2.940 0.450 - 4.500 uIU/mL      Assessment & Plan:   Problem List Items Addressed This Visit    None    Visit Diagnoses    Impacted cerumen of right ear    -  Primary   Mild. Flushed today with good results.    Vertigo       Following with neurology. Notes not available for review. Continue to monitor. Will get PT when COVID-19 is over.        Follow up plan: Return if symptoms worsen or fail to improve.

## 2018-12-29 ENCOUNTER — Other Ambulatory Visit: Payer: Self-pay | Admitting: Family Medicine

## 2019-01-01 ENCOUNTER — Ambulatory Visit: Payer: Medicare Other | Admitting: Family Medicine

## 2019-01-07 DIAGNOSIS — R42 Dizziness and giddiness: Secondary | ICD-10-CM | POA: Diagnosis not present

## 2019-01-07 DIAGNOSIS — J432 Centrilobular emphysema: Secondary | ICD-10-CM | POA: Diagnosis not present

## 2019-01-07 DIAGNOSIS — J449 Chronic obstructive pulmonary disease, unspecified: Secondary | ICD-10-CM | POA: Diagnosis not present

## 2019-01-12 ENCOUNTER — Other Ambulatory Visit: Payer: Self-pay

## 2019-01-12 DIAGNOSIS — C183 Malignant neoplasm of hepatic flexure: Secondary | ICD-10-CM

## 2019-01-29 DIAGNOSIS — R51 Headache: Secondary | ICD-10-CM | POA: Diagnosis not present

## 2019-01-29 DIAGNOSIS — R519 Headache, unspecified: Secondary | ICD-10-CM | POA: Insufficient documentation

## 2019-01-29 DIAGNOSIS — R42 Dizziness and giddiness: Secondary | ICD-10-CM | POA: Diagnosis not present

## 2019-01-30 ENCOUNTER — Encounter: Payer: Self-pay | Admitting: Family Medicine

## 2019-01-30 ENCOUNTER — Other Ambulatory Visit: Payer: Self-pay

## 2019-01-30 ENCOUNTER — Ambulatory Visit (INDEPENDENT_AMBULATORY_CARE_PROVIDER_SITE_OTHER): Payer: Medicare Other | Admitting: Family Medicine

## 2019-01-30 VITALS — BP 117/72 | HR 74 | Temp 98.0°F | Ht 60.0 in | Wt 94.0 lb

## 2019-01-30 DIAGNOSIS — E440 Moderate protein-calorie malnutrition: Secondary | ICD-10-CM

## 2019-01-30 DIAGNOSIS — I10 Essential (primary) hypertension: Secondary | ICD-10-CM | POA: Diagnosis not present

## 2019-01-30 DIAGNOSIS — R42 Dizziness and giddiness: Secondary | ICD-10-CM | POA: Diagnosis not present

## 2019-01-30 MED ORDER — AMLODIPINE BESYLATE 2.5 MG PO TABS: 2.5000 mg | ORAL_TABLET | Freq: Every day | ORAL | 1 refills | Status: DC

## 2019-01-30 MED ORDER — FERROUS SULFATE 325 (65 FE) MG PO TABS: 325.0000 mg | ORAL_TABLET | Freq: Three times a day (TID) | ORAL | 3 refills | Status: DC

## 2019-01-30 MED ORDER — METOPROLOL TARTRATE 50 MG PO TABS: 50.0000 mg | ORAL_TABLET | Freq: Two times a day (BID) | ORAL | 1 refills | Status: DC

## 2019-01-30 NOTE — Progress Notes (Signed)
BP 117/72   Pulse 74   Temp 98 F (36.7 C) (Oral)   Ht 5' (1.524 m)   Wt 94 lb (42.6 kg)   SpO2 96%   BMI 18.36 kg/m    Subjective:    Patient ID: [name], female    DOB: 04/07/1928, 85 y.o.   MRN: [id number]  HPI: [name] is a 85 y.o. female  Chief Complaint  Patient presents with  . Dizziness    f/u   Has been following with neurology. Has not been able to do PT due to the covid-19 pandemic. Has been doing a bit better. Still getting dizzy, but it's not as often.  HYPERTENSION Hypertension status: controlled  Satisfied with current treatment? yes Duration of hypertension: chronic BP monitoring frequency:  not checking BP medication side effects:  no Medication compliance: good compliance Previous BP meds: amlodipine, metoprolol Aspirin: yes Recurrent headaches: no Visual changes: no Palpitations: no Dyspnea: no Chest pain: no Lower extremity edema: no Dizzy/lightheaded: yes   Relevant past medical, surgical, family and social history reviewed and updated as indicated. Interim medical history since our last visit reviewed. Allergies and medications reviewed and updated.  Review of Systems  Constitutional: Positive for fatigue. Negative for activity change, appetite change, chills, diaphoresis, fever and unexpected weight change.  HENT: Negative.   Eyes: Negative.   Respiratory: Negative.   Cardiovascular: Negative.   Gastrointestinal: Positive for constipation. Negative for abdominal distention, abdominal pain, anal bleeding, blood in stool, diarrhea, nausea, rectal pain and vomiting.  Musculoskeletal: Negative.   Neurological: Positive for dizziness and light-headedness. Negative for tremors, seizures, syncope, facial asymmetry, speech difficulty, weakness, numbness and headaches.  Psychiatric/Behavioral: Negative.     Per HPI unless specifically indicated above     Objective:    BP 117/72   Pulse 74   Temp 98 F (36.7 C)  (Oral)   Ht 5' (1.524 m)   Wt 94 lb (42.6 kg)   SpO2 96%   BMI 18.36 kg/m   Wt Readings from Last 3 Encounters:  01/30/19 94 lb (42.6 kg)  12/08/18 93 lb (42.2 kg)  11/16/18 90 lb (40.8 kg)    Physical Exam Vitals signs and nursing note reviewed.  Constitutional:      General: She is not in acute distress.    Appearance: Normal appearance. She is not ill-appearing, toxic-appearing or diaphoretic.  HENT:     Head: Normocephalic and atraumatic.     Right Ear: External ear normal.     Left Ear: External ear normal.     Nose: Nose normal.     Mouth/Throat:     Mouth: Mucous membranes are moist.     Pharynx: Oropharynx is clear.  Eyes:     General: No scleral icterus.       Right eye: No discharge.        Left eye: No discharge.     Extraocular Movements: Extraocular movements intact.     Conjunctiva/sclera: Conjunctivae normal.     Pupils: Pupils are equal, round, and reactive to light.  Neck:     Musculoskeletal: Normal range of motion and neck supple.  Cardiovascular:     Rate and Rhythm: Normal rate and regular rhythm.     Pulses: Normal pulses.     Heart sounds: Normal heart sounds. No murmur. No friction rub. No gallop.   Pulmonary:     Effort: Pulmonary effort is normal. No respiratory distress.     Breath sounds: Normal  breath sounds. No stridor. No wheezing, rhonchi or rales.  Chest:     Chest wall: No tenderness.  Abdominal:     General: Abdomen is flat. Bowel sounds are normal. There is no distension.     Palpations: Abdomen is soft. There is no mass.     Tenderness: There is no abdominal tenderness. There is no right CVA tenderness, left CVA tenderness, guarding or rebound.     Hernia: No hernia is present.  Musculoskeletal: Normal range of motion.  Skin:    General: Skin is warm and dry.     Capillary Refill: Capillary refill takes less than 2 seconds.     Coloration: Skin is not jaundiced or pale.     Findings: No bruising, erythema, lesion or rash.   Neurological:     General: No focal deficit present.     Mental Status: She is alert and oriented to person, place, and time. Mental status is at baseline.  Psychiatric:        Mood and Affect: Mood normal.        Behavior: Behavior normal.        Thought Content: Thought content normal.        Judgment: Judgment normal.     Results for orders placed or performed in visit on 01/12/19  CEA  Result Value Ref Range   CEA 3.5 0.0 - 4.7 ng/mL      Assessment & Plan:   Problem List Items Addressed This Visit      Cardiovascular and Mediastinum   Essential hypertension    Under good control on current regimen. Continue current regimen. Continue to monitor. Call with any concerns. Refills given.        Relevant Medications   metoprolol tartrate (LOPRESSOR) 50 MG tablet   amLODipine (NORVASC) 2.5 MG tablet     Other   Protein-calorie malnutrition ([place])    Continue ensure. Weight stable. Continue to monitor.        Other Visit Diagnoses    Vertigo    -  Primary   Doing a bit better. Continue to follow with neurology. Call with any concerns.        Follow up plan: Return October, for follow up.

## 2019-02-02 DIAGNOSIS — C183 Malignant neoplasm of hepatic flexure: Secondary | ICD-10-CM | POA: Diagnosis not present

## 2019-02-03 LAB — CEA: CEA: 3.5 ng/mL (ref 0.0–4.7)

## 2019-02-04 NOTE — Assessment & Plan Note (Signed)
Continue ensure. Weight stable. Continue to monitor.

## 2019-02-04 NOTE — Assessment & Plan Note (Signed)
Under good control on current regimen. Continue current regimen. Continue to monitor. Call with any concerns. Refills given.   

## 2019-02-06 ENCOUNTER — Ambulatory Visit (INDEPENDENT_AMBULATORY_CARE_PROVIDER_SITE_OTHER): Payer: Medicare Other | Admitting: Surgery

## 2019-02-06 ENCOUNTER — Encounter: Payer: Self-pay | Admitting: Surgery

## 2019-02-06 ENCOUNTER — Other Ambulatory Visit: Payer: Self-pay

## 2019-02-06 VITALS — BP 154/63 | HR 62 | Temp 97.5°F | Resp 16 | Ht 60.0 in | Wt 93.2 lb

## 2019-02-06 DIAGNOSIS — Z09 Encounter for follow-up examination after completed treatment for conditions other than malignant neoplasm: Secondary | ICD-10-CM | POA: Diagnosis not present

## 2019-02-06 DIAGNOSIS — C183 Malignant neoplasm of hepatic flexure: Secondary | ICD-10-CM

## 2019-02-06 NOTE — Patient Instructions (Addendum)
We will send referral to [facility] for the Colonoscopy.  We will contact you in October 2020 to schedule a follow up appointment with Dr.Piscoya. If you do not hear from [place] in October please call our office.  Please be sure to have your blood work prior to seeing Dr.Piscoya in November.

## 2019-02-06 NOTE — Progress Notes (Signed)
02/06/2019  History of Present Illness: [name] is a 85 y.o. female status post laparoscopic right colectomy on 06/26/18 for right colon cancer.  She presents today for follow-up.  She reports that from the abdominal standpoint she has been doing well with no issues with nausea, vomiting, or pain.  She does report having more recently developed vertigo and sometimes will have worse issues when she stands up too quickly.  Some days are better than others.  Denies any blood in her stool.  Patient had lab draw this week for a CEA which is 3.5.  Past Medical History: Past Medical History:  Diagnosis Date  . Anxiety   . Atrial fibrillation ([place])   . Clotting disorder ([facility])    left ankle  . COPD (chronic obstructive pulmonary disease) ([place])   . Depression   . Hypertension   . Melena 06/22/2018  . Obstructive chronic bronchitis without exacerbation ([place]) 12/18/2016  . Overactive bladder   . Thyroid disease      Past Surgical History: Past Surgical History:  Procedure Laterality Date  . ABDOMINAL HYSTERECTOMY    . BREAST LUMPECTOMY    . COLONOSCOPY Left 06/23/2018   Procedure: COLONOSCOPY;  Surgeon: Jonathon Bellows, MD;  Location: [facility] ENDOSCOPY;  Service: Gastroenterology;  Laterality: Left;  Colonoscopy first. Dr. Vicente Males will decide if EGD is needed after colonoscopy  . ESOPHAGOGASTRODUODENOSCOPY Left 06/23/2018   Procedure: ESOPHAGOGASTRODUODENOSCOPY (EGD);  Surgeon: Jonathon Bellows, MD;  Location: [facility] ENDOSCOPY;  Service: Gastroenterology;  Laterality: Left;  Colonoscopy first. Dr. Vicente Males will decide if EGD is needed after colonoscopy  . LAPAROSCOPIC RIGHT COLECTOMY Right 06/26/2018   Procedure: LAPAROSCOPIC RIGHT COLECTOMY;  Surgeon: Olean Ree, MD;  Location: [facility] ORS;  Service: General;  Laterality: Right;    Home Medications: Prior to Admission medications   Medication Sig Start Date End Date Taking? Authorizing Provider  acetaminophen (TYLENOL) 325 MG tablet Take 2 tablets  (650 mg total) by mouth every 6 (six) hours as needed for mild pain or fever. 07/01/18  Yes Preeya Cleckley, Jacqulyn Bath, MD  amLODipine (NORVASC) 2.5 MG tablet Take 1 tablet (2.5 mg total) by mouth daily. 01/30/19  Yes Johnson, Megan P, DO  aspirin EC 81 MG tablet Take 1 tablet (81 mg total) by mouth daily. 05/19/18  Yes Johnson, Megan P, DO  feeding supplement, ENSURE ENLIVE, (ENSURE ENLIVE) LIQD Take 237 mLs by mouth 2 (two) times daily between meals. 07/01/18  Yes Lennie Vasco, Jacqulyn Bath, MD  ferrous sulfate 325 (65 FE) MG tablet Take 1 tablet (325 mg total) by mouth 3 (three) times daily with meals. 01/30/19  Yes Johnson, Megan P, DO  fluticasone (FLONASE) 50 MCG/ACT nasal spray Place 2 sprays into both nostrils daily. 12/08/18  Yes Johnson, Megan P, DO  Fluticasone-Salmeterol (ADVAIR DISKUS) 250-50 MCG/DOSE AEPB Inhale 1 puff into the lungs daily. Rinse mouth after use. 12/08/18 12/08/19 Yes Johnson, Megan P, DO  latanoprost (XALATAN) 0.005 % ophthalmic solution INSTILL 1 DROP INTO EACH EYE AT BEDTIME 11/24/18  Yes [provider]  levothyroxine (SYNTHROID, LEVOTHROID) 88 MCG tablet TAKE 1 TABLET BY MOUTH ONCE DAILY BEFORE BREAKFAST 11/26/18  Yes Johnson, Megan P, DO  metoprolol tartrate (LOPRESSOR) 50 MG tablet Take 1 tablet (50 mg total) by mouth 2 (two) times daily. 01/30/19  Yes Johnson, Megan P, DO  multivitamin-lutein (OCUVITE-LUTEIN) CAPS capsule Take 1 capsule by mouth daily. 07/02/18  Yes Calloway Andrus, Jacqulyn Bath, MD  OXYGEN Inhale 2 L into the lungs. As needed during the day, and full use at bedtime  Yes [provider]  solifenacin (VESICARE) 10 MG tablet Take 1 tablet by mouth once daily 12/29/18  Yes Johnson, Megan P, DO    Allergies: No Known Allergies  Review of Systems: Review of Systems  Constitutional: Negative for chills and fever.  Respiratory: Negative for shortness of breath.   Cardiovascular: Negative for chest pain.  Gastrointestinal: Negative for abdominal pain, blood in stool, nausea and  vomiting.  Neurological: Positive for dizziness.    Physical Exam BP (!) 154/63   Pulse 62   Temp (!) 97.5 F (36.4 C) (Temporal)   Resp 16   Ht 5' (1.524 m)   Wt 93 lb 3.2 oz (42.3 kg)   SpO2 96%   BMI 18.20 kg/m  CONSTITUTIONAL: No acute distress HEENT:  Normocephalic, atraumatic, extraocular motion intact. RESPIRATORY:  Lungs are clear, and breath sounds are equal bilaterally. Normal respiratory effort without pathologic use of accessory muscles. CARDIOVASCULAR: Heart is regular without murmurs, gallops, or rubs. GI: The abdomen is soft, thin, nondistended, nontender to palpation.  Prior incisions are well-healed and faint. There were no palpable masses.  NEUROLOGIC:  Motor and sensation is grossly normal.  Cranial nerves are grossly intact. PSYCH:  Alert and oriented to person, place and time. Affect is normal.  Labs/Imaging: CEA 3.5  Assessment and Plan: This is a 85 y.o. female status post laparoscopic right colectomy for right colon cancer.  -Patient is doing well from the abdominal standpoint however with new onset of vertigo. -CEA is 3.5 today.  She is asymptomatic from the abdominal standpoint. - We will have the patient follow-up in November with repeat CEA, CBC, CMP, 1 year CT scan of chest abdomen and pelvis and will send a referral to Dr. Vira Agar with gastroenterology for colonoscopy for 1 year evaluation.  This follows NCCN guidelines.  Face-to-face time spent with the patient and care providers was 15 minutes, with more than 50% of the time spent counseling, educating, and coordinating care of the patient.     Melvyn Neth, [facility]

## 2019-02-07 DIAGNOSIS — J449 Chronic obstructive pulmonary disease, unspecified: Secondary | ICD-10-CM | POA: Diagnosis not present

## 2019-02-07 DIAGNOSIS — J432 Centrilobular emphysema: Secondary | ICD-10-CM | POA: Diagnosis not present

## 2019-02-12 ENCOUNTER — Telehealth: Payer: Self-pay | Admitting: Family Medicine

## 2019-02-12 NOTE — Chronic Care Management (AMB) (Signed)
Chronic Care Management   Note  02/12/2019 Name: [name] MRN: [id number] DOB: 1927-11-18  [name] is a 85 y.o. year old female who is a primary care patient of Valerie Roys, DO. I reached out to [name] by phone today in response to a referral sent by Ms. [name]'s health plan.    [name] was given information about Chronic Care Management services today including:  1. CCM service includes personalized support from designated clinical staff supervised by her physician, including individualized plan of care and coordination with other care providers 2. 24/7 contact phone numbers for assistance for urgent and routine care needs. 3. Service will only be billed when office clinical staff spend 20 minutes or more in a month to coordinate care. 4. Only one practitioner may furnish and bill the service in a calendar month. 5. The patient may stop CCM services at any time (effective at the end of the month) by phone call to the office staff. 6. The patient will be responsible for cost sharing (co-pay) of up to 20% of the service fee (after annual deductible is met).  Patient agreed to services and verbal consent obtained.   Follow up plan: Telephone appointment with CCM team member scheduled for: 06/23/202  [place]  ??[email or web address]   ??[phone]

## 2019-02-24 ENCOUNTER — Ambulatory Visit: Payer: Self-pay | Admitting: *Deleted

## 2019-02-24 ENCOUNTER — Telehealth: Payer: Medicare Other

## 2019-02-24 DIAGNOSIS — R42 Dizziness and giddiness: Secondary | ICD-10-CM

## 2019-02-24 NOTE — Chronic Care Management (AMB) (Signed)
  Chronic Care Management   Outreach Note  02/24/2019 Name: [name] MRN: [id number] DOB: 1928-05-06  Referred by: Valerie Roys, DO Reason for referral : Chronic Care Management (1st Unsuccessful Outreach )   An unsuccessful telephone outreach was attempted today. The patient was referred to the case management team by for assistance with chronic care management and care coordination.   Follow Up Plan: A HIPPA compliant phone message was left for the patient providing contact information and requesting a return call.  The care management team will reach out to the patient again over the next 30 days.   Merlene Morse Topher [place] RN, BSN Nurse Case Editor, commissioning Family Practice/[facility] Care Management  [phone]) Business Mobile

## 2019-03-02 ENCOUNTER — Telehealth: Payer: Self-pay | Admitting: Internal Medicine

## 2019-03-02 NOTE — Progress Notes (Signed)
[facility] Consultation      Assessment and Plan:  COPD. - Continues to have some mild dyspnea on exertion   Dyspnea on exertion. - Mild dyspnea on exertion, likely multifactorial from COPD, deconditioning. - Continue to be active.  Chronic rhinitis. - Recommended Flonase.  Return if symptoms worsen or fail to improve.    Date: 03/02/2019  MRN# [id number] [name] 12-04-1927   [name] is a 85 y.o. old female seen in consultation for chief complaint of:    Chief Complaint  Patient presents with  . Follow-up    COPD Breathing has been stable if she doesn't exert herself she doesn't need her O2.  Currently has O2@2L  that she uses at night- [facility]    HPI:   [name] is a 85 y.o. female  with atrial fibrillation, emphysema on 2 L at night.  She was diagnosed with colon cancer in October 2019, and had a colectomy done.  At last visit she continued to have some dyspnea thought to be secondary to COPD as well as debility from recent hospitalization for colectomy.  She was recommended to continue Advair, physical therapy, Flonase.  Today she notes that she is doing better, she feels that her breathing and stamina are ok. She is fine at rest but she has dyspnea on mild exertion. She still drives a car and goes to grocery store and she is able to do it as long as she goes slowly and holds on to a cart.  She is on advair once daily.   **Desat waslk 04/28/18>> baseline at rest on RA was 95% and HR 71. Walked 180 feet at a slow pace, minimal dyspnea with sat of 94% and HR 72.   **CBC 02/07/2018>> absolute eosinophil count equals 400.   Medication:    Current Outpatient Medications:  .  acetaminophen (TYLENOL) 325 MG tablet, Take 2 tablets (650 mg total) by mouth every 6 (six) hours as needed for mild pain or fever., Disp: , Rfl:  .  amLODipine (NORVASC) 2.5 MG tablet, Take 1 tablet (2.5 mg total) by mouth daily., Disp: 90 tablet,  Rfl: 1 .  aspirin EC 81 MG tablet, Take 1 tablet (81 mg total) by mouth daily., Disp: 100 tablet, Rfl: 4 .  feeding supplement, ENSURE ENLIVE, (ENSURE ENLIVE) LIQD, Take 237 mLs by mouth 2 (two) times daily between meals., Disp: 237 mL, Rfl: 12 .  ferrous sulfate 325 (65 FE) MG tablet, Take 1 tablet (325 mg total) by mouth 3 (three) times daily with meals., Disp: 90 tablet, Rfl: 3 .  fluticasone (FLONASE) 50 MCG/ACT nasal spray, Place 2 sprays into both nostrils daily., Disp: 16 g, Rfl: 6 .  Fluticasone-Salmeterol (ADVAIR DISKUS) 250-50 MCG/DOSE AEPB, Inhale 1 puff into the lungs daily. Rinse mouth after use., Disp: 1 each, Rfl: 6 .  latanoprost (XALATAN) 0.005 % ophthalmic solution, INSTILL 1 DROP INTO EACH EYE AT BEDTIME, Disp: , Rfl:  .  levothyroxine (SYNTHROID, LEVOTHROID) 88 MCG tablet, TAKE 1 TABLET BY MOUTH ONCE DAILY BEFORE BREAKFAST, Disp: 90 tablet, Rfl: 3 .  metoprolol tartrate (LOPRESSOR) 50 MG tablet, Take 1 tablet (50 mg total) by mouth 2 (two) times daily., Disp: 180 tablet, Rfl: 1 .  multivitamin-lutein (OCUVITE-LUTEIN) CAPS capsule, Take 1 capsule by mouth daily., Disp: , Rfl: 0 .  OXYGEN, Inhale 2 L into the lungs. As needed during the day, and full use at bedtime, Disp: , Rfl:  .  solifenacin (VESICARE) 10  MG tablet, Take 1 tablet by mouth once daily, Disp: 90 tablet, Rfl: 0   Allergies:  Patient has no known allergies.  Review of Systems:  Constitutional: Feels well. Cardiovascular: Denies chest pain, exertional chest pain.  Pulmonary: Denies hemoptysis, pleuritic chest pain.   The remainder of systems were reviewed and were found to be negative other than what is documented in the HPI.    Physical Examination:   VS: BP 130/60 (BP Location: Left Arm, Cuff Size: Normal)   Pulse 60   Temp (!) 97.3 F (36.3 C) (Skin)   Ht 5' (1.524 m)   Wt 94 lb (42.6 kg)   SpO2 96%   BMI 18.36 kg/m   General Appearance: No distress  Neuro:without focal findings, mental status,  speech normal, alert and oriented HEENT: PERRLA, EOM intact Pulmonary: No wheezing, No rales  CardiovascularNormal S1,S2.  No m/r/g.  Abdomen: Benign, Soft, non-tender, No masses Renal:  No costovertebral tenderness  GU:  No performed at this time. Endoc: No evident thyromegaly, no signs of acromegaly or Cushing features Skin:   warm, no rashes, no ecchymosis  Extremities: normal, no cyanosis, clubbing.     LABORATORY PANEL:   CBC No results for input(s): WBC, HGB, HCT, PLT in the last 168 hours. ------------------------------------------------------------------------------------------------------------------  Chemistries  No results for input(s): NA, K, CL, CO2, GLUCOSE, BUN, CREATININE, CALCIUM, MG, AST, ALT, ALKPHOS, BILITOT in the last 168 hours.  Invalid input(s): GFRCGP ------------------------------------------------------------------------------------------------------------------  Cardiac Enzymes No results for input(s): TROPONINI in the last 168 hours. ------------------------------------------------------------  RADIOLOGY:  No results found.     Thank  you for the consultation and for allowing [facility] to assist in the care of your patient. Our recommendations are noted above.  Please contact us if we can be of further service.   Marda Stalker, M.D., F.C.C.P.  Board Certified in Internal Medicine, Pulmonary Medicine, [place], and Sleep Medicine.  [facility] Office Number: [phone]   03/02/2019

## 2019-03-02 NOTE — Telephone Encounter (Signed)
Called patient for COVID-19 pre-screening for in office visit. ° °Have you recently traveled any where out of the local area in the last 2 weeks? No ° °Have you been in close contact with a person diagnosed with COVID-19 or someone awaiting results within the last 2 weeks? No ° °Do you currently have any of the following symptoms? If so, when did they start? °Cough     Diarrhea   Joint Pain °Fever      Muscle Pain   Red eyes °Shortness of breath   Abdominal pain  Vomiting °Loss of smell    Rash    Sore Throat °Headache    Weakness   Bruising or bleeding ° ° °Okay to proceed with visit 03/03/2019 °  ° ° °

## 2019-03-03 ENCOUNTER — Other Ambulatory Visit: Payer: Self-pay

## 2019-03-03 ENCOUNTER — Ambulatory Visit (INDEPENDENT_AMBULATORY_CARE_PROVIDER_SITE_OTHER): Payer: Medicare Other | Admitting: Internal Medicine

## 2019-03-03 ENCOUNTER — Encounter: Payer: Self-pay | Admitting: Internal Medicine

## 2019-03-03 VITALS — BP 130/60 | HR 60 | Temp 97.3°F | Ht 60.0 in | Wt 94.0 lb

## 2019-03-03 DIAGNOSIS — J432 Centrilobular emphysema: Secondary | ICD-10-CM | POA: Diagnosis not present

## 2019-03-03 DIAGNOSIS — J449 Chronic obstructive pulmonary disease, unspecified: Secondary | ICD-10-CM

## 2019-03-03 NOTE — Patient Instructions (Addendum)
Continue advair.  Use nasal spray when needed.  Continue to try to be active.

## 2019-03-05 DIAGNOSIS — Z85038 Personal history of other malignant neoplasm of large intestine: Secondary | ICD-10-CM | POA: Diagnosis not present

## 2019-03-05 DIAGNOSIS — R0989 Other specified symptoms and signs involving the circulatory and respiratory systems: Secondary | ICD-10-CM | POA: Diagnosis not present

## 2019-03-05 DIAGNOSIS — D509 Iron deficiency anemia, unspecified: Secondary | ICD-10-CM | POA: Diagnosis not present

## 2019-03-09 DIAGNOSIS — J432 Centrilobular emphysema: Secondary | ICD-10-CM | POA: Diagnosis not present

## 2019-03-09 DIAGNOSIS — J449 Chronic obstructive pulmonary disease, unspecified: Secondary | ICD-10-CM | POA: Diagnosis not present

## 2019-03-10 ENCOUNTER — Encounter: Payer: Medicare Other | Attending: Physician Assistant | Admitting: Physician Assistant

## 2019-03-10 ENCOUNTER — Other Ambulatory Visit: Payer: Self-pay

## 2019-03-10 DIAGNOSIS — J449 Chronic obstructive pulmonary disease, unspecified: Secondary | ICD-10-CM | POA: Insufficient documentation

## 2019-03-10 DIAGNOSIS — I10 Essential (primary) hypertension: Secondary | ICD-10-CM | POA: Insufficient documentation

## 2019-03-10 DIAGNOSIS — H409 Unspecified glaucoma: Secondary | ICD-10-CM | POA: Insufficient documentation

## 2019-03-10 DIAGNOSIS — I4891 Unspecified atrial fibrillation: Secondary | ICD-10-CM | POA: Diagnosis not present

## 2019-03-10 DIAGNOSIS — L97812 Non-pressure chronic ulcer of other part of right lower leg with fat layer exposed: Secondary | ICD-10-CM | POA: Insufficient documentation

## 2019-03-10 DIAGNOSIS — S8991XA Unspecified injury of right lower leg, initial encounter: Secondary | ICD-10-CM | POA: Diagnosis not present

## 2019-03-10 DIAGNOSIS — I872 Venous insufficiency (chronic) (peripheral): Secondary | ICD-10-CM | POA: Insufficient documentation

## 2019-03-10 DIAGNOSIS — Z87891 Personal history of nicotine dependence: Secondary | ICD-10-CM | POA: Diagnosis not present

## 2019-03-10 DIAGNOSIS — Z8249 Family history of ischemic heart disease and other diseases of the circulatory system: Secondary | ICD-10-CM | POA: Insufficient documentation

## 2019-03-10 DIAGNOSIS — L97811 Non-pressure chronic ulcer of other part of right lower leg limited to breakdown of skin: Secondary | ICD-10-CM | POA: Diagnosis not present

## 2019-03-10 NOTE — Progress Notes (Signed)
[name] ([id number]) Visit Report for 03/10/2019 Abuse/Suicide Risk Screen Details Patient Name: [name], [name]. Date of Service: 03/10/2019 9:45 AM Medical Record Number: [id number] Patient Account Number: [id number] Date of Birth/Sex: [birth year] (85 y.o. F) Treating RN: Harold Barban Primary Care Shiree Altemus: Park Liter Other Clinician: Referring Natallia Stellmach: Referral, Self Treating Cregg Jutte/Extender: STONE III, [name] Weeks in Treatment: 0 Abuse/Suicide Risk Screen Items Answer ABUSE RISK SCREEN: Has anyone close to you tried to hurt or harm you recentlyo No Do you feel uncomfortable with anyone in your familyo No Has anyone forced you do things that you didnot want to doo No Electronic Signature(s) Signed: 03/10/2019 2:42:48 PM By: Harold Barban Entered By: Harold Barban on 03/10/2019 09:34:39 Yaden, Zanylah H. ([id number]) -------------------------------------------------------------------------------- Activities of Daily Living Details Patient Name: [name], [name] Date of Service: 03/10/2019 9:45 AM Medical Record Number: [id number] Patient Account Number: [id number] Date of Birth/Sex: Jan 18, 1928 (85 y.o. F) Treating RN: Harold Barban Primary Care Calvert Charland: Park Liter Other Clinician: Referring Ashantae Pangallo: Referral, Self Treating Ruberta Holck/Extender: STONE III, [name] Weeks in Treatment: 0 Activities of Daily Living Items Answer Activities of Daily Living (Please select one for each item) Drive Automobile Completely Able Take Medications Completely Able Use Telephone Completely Able Care for Appearance Completely Able Use Toilet Completely Able Bath / Shower Completely Able Dress Self Completely Able Feed Self Completely Able Walk Completely Able Get In / Out Bed Completely Able Housework Completely Able Prepare Meals Completely Able Handle Money Completely Able Shop for Self Completely Able Electronic Signature(s) Signed: 03/10/2019 2:42:48 PM  By: Harold Barban Entered By: Harold Barban on 03/10/2019 09:34:54 Stoiber, [name] ([id number]) -------------------------------------------------------------------------------- Education Screening Details Patient Name: Carol Berger H. Date of Service: 03/10/2019 9:45 AM Medical Record Number: [id number] Patient Account Number: [id number] Date of Birth/Sex: 02/27/1928 (85 y.o. F) Treating RN: Harold Barban Primary Care Runa Whittingham: Park Liter Other Clinician: Referring Yug Loria: Referral, Self Treating Avira Tillison/Extender: [name], [name] Weeks in Treatment: 0 Primary Learner Assessed: Patient Learning Preferences/Education Level/Primary Language Learning Preference: Explanation Highest Education Level: High School Preferred Language: English Cognitive Barrier Language Barrier: No Translator Needed: No Memory Deficit: No Emotional Barrier: No Cultural/Religious Beliefs Affecting Medical Care: No Physical Barrier Impaired Vision: No Impaired Hearing: No Decreased Hand dexterity: No Knowledge/Comprehension Knowledge Level: High Comprehension Level: High Ability to understand written High instructions: Ability to understand verbal High instructions: Motivation Anxiety Level: Calm Cooperation: Cooperative Education Importance: Acknowledges Need Interest in Health Problems: Asks Questions Perception: Coherent Willingness to Engage in Self- High Management Activities: Readiness to Engage in Self- High Management Activities: Electronic Signature(s) Signed: 03/10/2019 2:42:48 PM By: Harold Barban Entered By: Harold Barban on 03/10/2019 09:35:25 Horsey, [name] ([id number]) -------------------------------------------------------------------------------- Fall Risk Assessment Details Patient Name: Kincheloe, [name] Date of Service: 03/10/2019 9:45 AM Medical Record Number: [id number] Patient Account Number: [id number] Date of Birth/Sex: 04-05-1928 (85  y.o. F) Treating RN: Harold Barban Primary Care Mida Cory: Park Liter Other Clinician: Referring Bonita Brindisi: Referral, Self Treating Alylah Blakney/Extender: [name], [name] Weeks in Treatment: 0 Fall Risk Assessment Items Have you had 2 or more falls in the last 12 monthso 0 Yes Have you had any fall that resulted in injury in the last 12 monthso 0 No FALLS RISK SCREEN History of falling - immediate or within 3 months 0 No Secondary diagnosis (Do you have 2 or more medical diagnoseso) 0 No Ambulatory aid None/bed rest/wheelchair/nurse 0 No Crutches/cane/walker 0 No Furniture 0 No Intravenous therapy Access/Saline/Heparin Lock 0 No Gait/Transferring Normal/ bed rest/ wheelchair 0 No Weak (short  steps with or without shuffle, stooped but able to lift head while 0 No walking, may seek support from furniture) Impaired (short steps with shuffle, may have difficulty arising from chair, head 0 No down, impaired balance) Mental Status Oriented to own ability 0 Yes Electronic Signature(s) Signed: 03/10/2019 2:42:48 PM By: Harold Barban Entered By: Harold Barban on 03/10/2019 09:36:05 Tennis, Katheleen Lemmie Evens ([id number]) -------------------------------------------------------------------------------- Foot Assessment Details Patient Name: Hoff, [name] Date of Service: 03/10/2019 9:45 AM Medical Record Number: [id number] Patient Account Number: [id number] Date of Birth/Sex: [birth year] (85 y.o. F) Treating RN: Harold Barban Primary Care Annaya Bangert: Park Liter Other Clinician: Referring Charnice Zwilling: Referral, Self Treating Jerrika Ledlow/Extender: STONE III, [name] Weeks in Treatment: 0 Foot Assessment Items Site Locations + = Sensation present, - = Sensation absent, C = Callus, U = Ulcer R = Redness, W = Warmth, M = Maceration, PU = Pre-ulcerative lesion F = Fissure, S = Swelling, D = Dryness Assessment Right: Left: Other Deformity: No No Prior Foot Ulcer: No No Prior Amputation: No  No Charcot Joint: No No Ambulatory Status: Ambulatory Without Help Gait: Steady Electronic Signature(s) Signed: 03/10/2019 2:42:48 PM By: Harold Barban Entered By: Harold Barban on 03/10/2019 09:37:22 Carmer, [name] ([id number]) -------------------------------------------------------------------------------- Nutrition Risk Screening Details Patient Name: Morel, Shetara H. Date of Service: 03/10/2019 9:45 AM Medical Record Number: [id number] Patient Account Number: [id number] Date of Birth/Sex: 1928-07-12 (85 y.o. F) Treating RN: Harold Barban Primary Care Kaedan Richert: Park Liter Other Clinician: Referring Americus Scheurich: Referral, Self Treating Laporsche Hoeger/Extender: STONE III, [name] Weeks in Treatment: 0 Height (in): 60 Weight (lbs): 92 Body Mass Index (BMI): 18 Nutrition Risk Screening Items Score Screening NUTRITION RISK SCREEN: I have an illness or condition that made me change the kind and/or amount of 0 No food I eat I eat fewer than two meals per day 0 No I eat few fruits and vegetables, or milk products 0 No I have three or more drinks of beer, liquor or wine almost every day 0 No I have tooth or mouth problems that make it hard for me to eat 0 No I don't always have enough money to buy the food I need 0 No I eat alone most of the time 0 No I take three or more different prescribed or over-the-counter drugs a day 1 Yes Without wanting to, I have lost or gained 10 pounds in the last six months 0 No I am not always physically able to shop, cook and/or feed myself 0 No Nutrition Protocols Good Risk Protocol Moderate Risk Protocol High Risk Proctocol Risk Level: Good Risk Score: 1 Electronic Signature(s) Signed: 03/10/2019 2:42:48 PM By: Harold Barban Entered By: Harold Barban on 03/10/2019 09:36:18

## 2019-03-11 NOTE — Progress Notes (Signed)
LAURRIE, TOPPIN ([id number]) Visit Report for 03/10/2019 Chief Complaint Document Details Patient Name: [name], [name]. Date of Service: 03/10/2019 9:45 AM Medical Record Number: [id number] Patient Account Number: [id number] Date of Birth/Sex: 1927-11-05 (85 y.o. F) Treating RN: Montey Hora Primary Care Provider: Park Liter Other Clinician: Referring Provider: Referral, Self Treating Provider/Extender: [name], [name] Weeks in Treatment: 0 Information Obtained from: Patient Chief Complaint Right leg ulcers Electronic Signature(s) Signed: 03/11/2019 1:30:00 AM By: Worthy Keeler PA-C Entered By: Worthy Keeler on 03/10/2019 09:51:50 Firestine, Deshea H. ([id number]) -------------------------------------------------------------------------------- Debridement Details Patient Name: [name]. Date of Service: 03/10/2019 9:45 AM Medical Record Number: [id number] Patient Account Number: [id number] Date of Birth/Sex: October 15, 1927 (85 y.o. F) Treating RN: Montey Hora Primary Care Provider: Park Liter Other Clinician: Referring Provider: Referral, Self Treating Provider/Extender: [name], [name] Weeks in Treatment: 0 Debridement Performed for Wound #8 Right,Anterior Lower Leg Assessment: Performed By: Physician STONE III, [name] E., PA-C Debridement Type: Debridement Level of Consciousness (Pre- Awake and Alert procedure): Pre-procedure Verification/Time Yes - 09:57 Out Taken: Start Time: 09:57 Pain Control: Other : benzocaine 20% Total Area Debrided (L x W): 1.1 (cm) x 2.2 (cm) = 2.42 (cm) Tissue and other material Viable, Non-Viable, Blood Clots, Subcutaneous, Skin: Dermis , Skin: Epidermis debrided: Level: Skin/Subcutaneous Tissue Debridement Description: Excisional Instrument: Curette, Forceps, Scissors Bleeding: Minimum Hemostasis Achieved: Pressure End Time: 10:02 Procedural Pain: 0 Post Procedural Pain: 0 Response to Treatment: Procedure was  tolerated well Level of Consciousness Awake and Alert (Post-procedure): Post Debridement Measurements of Total Wound Length: (cm) 1.1 Width: (cm) 2.2 Depth: (cm) 0.2 Volume: (cm) 0.38 Character of Wound/Ulcer Post Debridement: Improved Post Procedure Diagnosis Same as Pre-procedure Electronic Signature(s) Signed: 03/10/2019 5:36:25 PM By: Montey Hora Signed: 03/11/2019 1:30:00 AM By: Worthy Keeler PA-C Entered By: Montey Hora on 03/10/2019 10:02:22 [name], [name] ([id number]) -------------------------------------------------------------------------------- Debridement Details Patient Name: Carol Berger H. Date of Service: 03/10/2019 9:45 AM Medical Record Number: [id number] Patient Account Number: [id number] Date of Birth/Sex: 10/22/1927 (85 y.o. F) Treating RN: Montey Hora Primary Care Provider: Park Liter Other Clinician: Referring Provider: Referral, Self Treating Provider/Extender: STONE III, [name] Weeks in Treatment: 0 Debridement Performed for Wound #7 Right,Medial Lower Leg Assessment: Performed By: Physician STONE III, [name] E., PA-C Debridement Type: Debridement Level of Consciousness (Pre- Awake and Alert procedure): Pre-procedure Verification/Time Yes - 10:03 Out Taken: Start Time: 10:03 Pain Control: Other : benzocaine 20% Total Area Debrided (L x W): 0.4 (cm) x 1.5 (cm) = 0.6 (cm) Tissue and other material Viable, Non-Viable, Subcutaneous, Skin: Dermis , Skin: Epidermis debrided: Level: Skin/Subcutaneous Tissue Debridement Description: Excisional Instrument: Curette, Forceps Bleeding: Minimum Hemostasis Achieved: Pressure End Time: 10:04 Procedural Pain: 0 Post Procedural Pain: 0 Response to Treatment: Procedure was tolerated well Level of Consciousness Awake and Alert (Post-procedure): Post Debridement Measurements of Total Wound Length: (cm) 0.4 Width: (cm) 1.5 Depth: (cm) 0.3 Volume: (cm) 0.141 Character of  Wound/Ulcer Post Debridement: Improved Post Procedure Diagnosis Same as Pre-procedure Electronic Signature(s) Signed: 03/10/2019 5:36:25 PM By: Montey Hora Signed: 03/11/2019 1:30:00 AM By: Worthy Keeler PA-C Entered By: Montey Hora on 03/10/2019 10:04:04 [name], [name] ([id number]) -------------------------------------------------------------------------------- HPI Details Patient Name: Carol Berger H. Date of Service: 03/10/2019 9:45 AM Medical Record Number: [id number] Patient Account Number: [id number] Date of Birth/Sex: 19-Dec-1927 (85 y.o. F) Treating RN: Montey Hora Primary Care Provider: Park Liter Other Clinician: Referring Provider: Referral, Self Treating Provider/Extender: STONE III, [name] Weeks in Treatment: 0 History of Present Illness HPI Description:  02/12/18 ADMISSION This is an 85 year old woman who is recently moved to [place] from [place]. Her story began in February where she fell on some ice suffering extensive lacerations to her bilateral lower extremities. She is able to show me extensive pictures of the right lower leg but with going to a wound care center and [place] 3 times a week these eventually closed over. She has been left with 1 area on the upper lateral left calf. She has been applying Neosporin to this and applying a bandage. Currently this measures 2 x 1.5 cm. The patient is not a diabetic. She is an ex-smoker quitting 40 years ago. She does have COPD by description. ABIs in our clinic were 1.32 on the right and 1.03 on the left 02/19/18; right lower leg wound which was initially trauma. The area that we look that last week is smaller. Still covered in a nonviable surface however. We are using Iodoflex 02/26/18; right lower leg wound which was initially trauma in the setting of chronic venous insufficiency. Surface of the wound looks much better healthy granulation advancing epithelialization. We have good edema control 03/05/18;  right lower leg wound which was initially trauma in the setting of chronic venous insufficiency. She continues to make nice progress here. We have good edema control oHe arrives today with a new traumatic laceration on her right dorsal forearm which she states happened while she was putting on a sweatshirt 03/12/18; right lower leg wound as closed however it still looks vulnerable. She has chronic venous insufficiency. oThe new wound from last week a traumatic area on her right dorsal or arm unfortunately does not have a viable surface. I remove some nonviable skin and necrotic subcutaneous debris from the wound surface. Hemostasis with silver nitrate and direct pressure 03/19/18; right lower leg wound is closed. She has chronic venous insufficiency and will need ongoing compression oThe new wounds from 2 weeks ago on the right upper elbow is closed. The area on her right dorsal forearm continues to have a nonviable surface requiring debridement 03/26/18; we'll need to look into ongoing compression for her legs. 2 wounds on the right upper elbow is closed. The area on the right dorsal forearm looks better. Hydrofera Blue secondary to hypertrophic granulation 04/02/18; he has compression stockings although she did not wear them today. Severe chronic venous insufficiency. She has 2 wounds on the right upper elbow which I said were closed last week which actually are although they're very small and she has a smaller clean wound on the right dorsal forearm. We've been using Hydrofera Blue secondary to some upper granulation. All of this looks better 04/09/18-She is seen in follow up evaluation for a right elbow and right forearm skin tear. The right elbow is healed. We will continue with same treatment plan and she'll follow next week 04/16/18-She is seen in follow-up evaluation for right forearm skin tear, this is essentially healed. We will cover with foam border and she will follow-up next week 04/23/18;  the patient arrives with a new skin tear on her right dorsal arm just below the elbow. She got this while carrying a box. She has a linear skin tear. There is no depth I don't think this should've been sutured. There is a skin flap medially I'm not sure if this will be viable or not 04/30/18; the new skin tear from last week unfortunately has remained viable in terms of the skin flap. She has a small open area that looks healthy. Using moistened  silver collagen 05/07/18; right dorsal arm skin tear. Nonviable tissue over the remnants of the wound. We've been using silver collagen 05/21/18; right dorsal arm skin tear. Nonviable tissue over a small remnant of the wound was washed off. We've been using silver collagen which we will continue 06/04/18; the right dorsal arm skin tear has healed. She arrives today with a traumatic wound just above the olecranon of her left elbow. This is a skin tear with a nonviable nonadherent flap which was removed. We'll use silver collagen here. Also noteworthy she arrives without her compression stockings 06/18/18; the area just above the olecranon of her left elbow. It is smaller and generally has a healthy surface. I was surprised [name], [name]. ([id number]) to learn that the patient is actually changing this herself although. Appears this week she was putting some topical lidocaine on this that she bought over-the-counter at the drugstore. She did get supplies at home she didn't know how to put it on. She drove herself here today i.e. not eligible for home health Readmission: 11/04/18 on evaluation today patient presents for evaluation our clinic news to include the she has on her lower extremity which occurred as the result of having hit this on a piece of furniture. This was back in December. She states that her daughter has been trying to get her to come into have this evaluated here the wound care center but she was being somewhat stubborn. Nonetheless upon  evaluation today the wound really appears to be potentially healed she does have some swelling of lower extremity which I think will come into play. With that being said he tells me that even yesterday this was still making clear fluid and therefore there may be some issues with continued problems with the swelling and drainage secondary to her venous stasis. Nonetheless overall wound appears to be doing fairly well which is good news. 11/11/18 on evaluation today patient actually appears to be doing excellent in the area of question that was green just the day before he saw her last time actually appears to have not drain that always in past week. I think this is done very well and at this point though she's had some discomfort I do not see any evidence of open wound which he needs to continue see wound care. Readmission: 03/10/19 on evaluation today patient presents for reevaluation here in our clinic concerning issues that she actually is having with the new problem on her right lower leg due to trauma that occurred when a car door shut on her leg last Thursday. This is not been quite a week and she does have several skin tears unfortunately that resulted from this injury. Fortunately there's no signs of active infection at this time. No fevers, chills, nausea, or vomiting noted at this time. She does have some hot tissue noted at this point that has me concerned about the possibility of needing to move this in order to allow the areas to heal appropriately. Fortunately there's no signs of active infection at this time. Electronic Signature(s) Signed: 03/11/2019 1:30:00 AM By: Worthy Keeler PA-C Entered By: Worthy Keeler on 03/10/2019 17:25:29 [name], [name] ([id number]) -------------------------------------------------------------------------------- Physical Exam Details Patient Name: [name], [name] Date of Service: 03/10/2019 9:45 AM Medical Record Number: [id number] Patient  Account Number: [id number] Date of Birth/Sex: 07/23/1928 (85 y.o. F) Treating RN: Montey Hora Primary Care Provider: Park Liter Other Clinician: Referring Provider: Referral, Self Treating Provider/Extender: STONE III, [name] Weeks in Treatment: 0 Constitutional  patient is hypertensive.. pulse regular and within target range for patient.Marland Kitchen respirations regular, non-labored and within target range for patient.Marland Kitchen temperature within target range for patient.. Well-nourished and well-hydrated in no acute distress. Eyes conjunctiva clear no eyelid edema noted. pupils equal round and reactive to light and accommodation. Ears, Nose, Mouth, and Throat no gross abnormality of ear auricles or external auditory canals. normal hearing noted during conversation. mucus membranes moist. Respiratory normal breathing without difficulty. clear to auscultation bilaterally. Cardiovascular regular rate and rhythm with normal S1, S2. 2+ dorsalis pedis/posterior tibialis pulses. no clubbing, cyanosis, significant edema, <3 sec cap refill. Gastrointestinal (GI) soft, non-tender, non-distended, +BS. no ventral hernia noted. Musculoskeletal normal gait and posture. no significant deformity or arthritic changes, no loss or range of motion, no clubbing. Psychiatric this patient is able to make decisions and demonstrates good insight into disease process. Alert and Oriented x 3. pleasant and cooperative. Notes Upon inspection patient's wound bed actually showed signs of good granulation at this time. Fortunately there did appear to be some excellent healing unfortunately she did have some areas as well where there were necrotic flaps that did need to be removed specifically on the medial ankle as well as the anterior right lower extremity. Sharp debridement was performed today to remove these areas to allow for more appropriate of complete healing without complication and she tolerated this very well  today. Electronic Signature(s) Signed: 03/11/2019 1:30:00 AM By: Worthy Keeler PA-C Entered By: Worthy Keeler on 03/10/2019 17:26:21 MILANIE, ROSENFIELD ([id number]) -------------------------------------------------------------------------------- Physician Orders Details Patient Name: [name]. Date of Service: 03/10/2019 9:45 AM Medical Record Number: [id number] Patient Account Number: [id number] Date of Birth/Sex: 1928-07-25 (85 y.o. F) Treating RN: Montey Hora Primary Care Provider: Park Liter Other Clinician: Referring Provider: Referral, Self Treating Provider/Extender: [name], [name] Weeks in Treatment: 0 Verbal / Phone Orders: No Diagnosis Coding ICD-10 Coding Code Description I87.2 Venous insufficiency (chronic) (peripheral) L97.812 Non-pressure chronic ulcer of other part of right lower leg with fat layer exposed Wound Cleansing Wound #10 Right,Distal Lower Leg o Clean wound with Normal Saline. o Dial antibacterial soap, wash wounds, rinse and pat dry prior to dressing wounds o May Shower, gently pat wound dry prior to applying new dressing. Wound #11 Right,Lateral Lower Leg o Clean wound with Normal Saline. o Dial antibacterial soap, wash wounds, rinse and pat dry prior to dressing wounds o May Shower, gently pat wound dry prior to applying new dressing. Wound #7 Right,Medial Lower Leg o Clean wound with Normal Saline. o Dial antibacterial soap, wash wounds, rinse and pat dry prior to dressing wounds o May Shower, gently pat wound dry prior to applying new dressing. Wound #8 Right,Anterior Lower Leg o Clean wound with Normal Saline. o Dial antibacterial soap, wash wounds, rinse and pat dry prior to dressing wounds o May Shower, gently pat wound dry prior to applying new dressing. Wound #9 Right,Proximal Lower Leg o Clean wound with Normal Saline. o Dial antibacterial soap, wash wounds, rinse and pat dry prior to dressing  wounds o May Shower, gently pat wound dry prior to applying new dressing. Anesthetic (add to Medication List) Wound #10 Right,Distal Lower Leg o Topical Lidocaine 4% cream applied to wound bed prior to debridement (In Clinic Only). Wound #11 Right,Lateral Lower Leg o Topical Lidocaine 4% cream applied to wound bed prior to debridement (In Clinic Only). Wound #7 Right,Medial Lower Leg o Topical Lidocaine 4% cream applied to wound bed prior to debridement (In Clinic Only).  Wound #8 Right,Anterior Lower Leg o Topical Lidocaine 4% cream applied to wound bed prior to debridement (In Clinic Only). Wound #9 Right,Proximal Lower Leg Plascencia, Tu H. ([id number]) o Topical Lidocaine 4% cream applied to wound bed prior to debridement (In Clinic Only). Primary Wound Dressing Wound #10 Right,Distal Lower Leg o Xeroform Wound #11 Right,Lateral Lower Leg o Xeroform Wound #9 Right,Proximal Lower Leg o Xeroform Wound #7 Right,Medial Lower Leg o Silver Collagen - to wound bed o Xeroform - over silver collagen Wound #8 Right,Anterior Lower Leg o Silver Collagen - to wound bed o Xeroform - over silver collagen Secondary Dressing Wound #10 Right,Distal Lower Leg o ABD and Kerlix/Conform - secure with netting Wound #11 Right,Lateral Lower Leg o ABD and Kerlix/Conform - secure with netting Wound #7 Right,Medial Lower Leg o ABD and Kerlix/Conform - secure with netting Wound #8 Right,Anterior Lower Leg o ABD and Kerlix/Conform - secure with netting Wound #9 Right,Proximal Lower Leg o ABD and Kerlix/Conform - secure with netting Dressing Change Frequency Wound #10 Right,Distal Lower Leg o Change Dressing Monday, Wednesday, Friday Wound #11 Right,Lateral Lower Leg o Change Dressing Monday, Wednesday, Friday Wound #7 Right,Medial Lower Leg o Change Dressing Monday, Wednesday, Friday Wound #8 Right,Anterior Lower Leg o Change Dressing Monday,  Wednesday, Friday Wound #9 Right,Proximal Lower Leg o Change Dressing Monday, Wednesday, Friday Follow-up Appointments o Return Appointment in 1 week. Home Health [name], [name] ([id number]) Wound #10 Right,Distal Lower Leg o [place] for [place] Nurse may visit PRN to address patientos wound care needs. o FACE TO FACE ENCOUNTER: MEDICARE and MEDICAID PATIENTS: I certify that this patient is under my care and that I had a face-to-face encounter that meets the physician face-to-face encounter requirements with this patient on this date. The encounter with the patient was in whole or in part for the following MEDICAL CONDITION: (primary reason for [place]) MEDICAL NECESSITY: I certify, that based on my findings, NURSING services are a medically necessary home health service. HOME BOUND STATUS: I certify that my clinical findings support that this patient is homebound (i.e., Due to illness or injury, pt requires aid of supportive devices such as crutches, cane, wheelchairs, walkers, the use of special transportation or the assistance of another person to leave their place of residence. There is a normal inability to leave the home and doing so requires considerable and taxing effort. Other absences are for medical reasons / religious services and are infrequent or of short duration when for other reasons). o If current dressing causes regression in wound condition, may D/C ordered dressing product/s and apply Normal Saline Moist Dressing daily until next [place] / Other MD appointment. [place] of regression in wound condition at [phone]. o Please direct any NON-WOUND related issues/requests for orders to patient's Primary Care Physician Wound #11 [place] for [place] Nurse may visit PRN to address patientos wound care needs. o  FACE TO FACE ENCOUNTER: MEDICARE and MEDICAID PATIENTS: I certify that this patient is under my care and that I had a face-to-face encounter that meets the physician face-to-face encounter requirements with this patient on this date. The encounter with the patient was in whole or in part for the following MEDICAL CONDITION: (primary reason for [place]) MEDICAL NECESSITY: I certify, that based on my findings, NURSING services are a medically necessary home health service. HOME BOUND STATUS: I certify that my clinical findings  support that this patient is homebound (i.e., Due to illness or injury, pt requires aid of supportive devices such as crutches, cane, wheelchairs, walkers, the use of special transportation or the assistance of another person to leave their place of residence. There is a normal inability to leave the home and doing so requires considerable and taxing effort. Other absences are for medical reasons / religious services and are infrequent or of short duration when for other reasons). o If current dressing causes regression in wound condition, may D/C ordered dressing product/s and apply Normal Saline Moist Dressing daily until next [place] / Other MD appointment. [place] of regression in wound condition at [phone]. o Please direct any NON-WOUND related issues/requests for orders to patient's Primary Care Physician Wound #7 [place] for [place] Nurse may visit PRN to address patientos wound care needs. o FACE TO FACE ENCOUNTER: MEDICARE and MEDICAID PATIENTS: I certify that this patient is under my care and that I had a face-to-face encounter that meets the physician face-to-face encounter requirements with this patient on this date. The encounter with the patient was in whole or in part for the following MEDICAL CONDITION: (primary reason for [place])  MEDICAL NECESSITY: I certify, that based on my findings, NURSING services are a medically necessary home health service. HOME BOUND STATUS: I certify that my clinical findings support that this patient is homebound (i.e., Due to illness or injury, pt requires aid of supportive devices such as crutches, cane, wheelchairs, walkers, the use of special transportation or the assistance of another person to leave their place of residence. There is a normal inability to leave the home and doing so requires considerable and taxing effort. Other absences are for medical reasons / religious services and are infrequent or of short duration when for other reasons). o If current dressing causes regression in wound condition, may D/C ordered dressing product/s and apply Normal Saline Moist Dressing daily until next [place] / Other MD appointment. [place] of regression in wound condition at [phone]. o Please direct any NON-WOUND related issues/requests for orders to patient's Primary Care Physician Wound #8 [place] for [place] Nurse may visit PRN to address patientos wound care needs. LASYA, VETTER ([id number]) o FACE TO FACE ENCOUNTER: MEDICARE and MEDICAID PATIENTS: I certify that this patient is under my care and that I had a face-to-face encounter that meets the physician face-to-face encounter requirements with this patient on this date. The encounter with the patient was in whole or in part for the following MEDICAL CONDITION: (primary reason for [place]) MEDICAL NECESSITY: I certify, that based on my findings, NURSING services are a medically necessary home health service. HOME BOUND STATUS: I certify that my clinical findings support that this patient is homebound (i.e., Due to illness or injury, pt requires aid of supportive devices such as crutches, cane, wheelchairs, walkers,  the use of special transportation or the assistance of another person to leave their place of residence. There is a normal inability to leave the home and doing so requires considerable and taxing effort. Other absences are for medical reasons / religious services and are infrequent or of short duration when for other reasons). o If current dressing causes regression in wound condition, may D/C ordered dressing product/s and apply Normal Saline Moist Dressing daily until next [place] / Other  MD appointment. [place] of regression in wound condition at [phone]. o Please direct any NON-WOUND related issues/requests for orders to patient's Primary Care Physician Wound #9 Right,Proximal Lower Leg o [place] for [place] Nurse may visit PRN to address patientos wound care needs. o FACE TO FACE ENCOUNTER: MEDICARE and MEDICAID PATIENTS: I certify that this patient is under my care and that I had a face-to-face encounter that meets the physician face-to-face encounter requirements with this patient on this date. The encounter with the patient was in whole or in part for the following MEDICAL CONDITION: (primary reason for [place]) MEDICAL NECESSITY: I certify, that based on my findings, NURSING services are a medically necessary home health service. HOME BOUND STATUS: I certify that my clinical findings support that this patient is homebound (i.e., Due to illness or injury, pt requires aid of supportive devices such as crutches, cane, wheelchairs, walkers, the use of special transportation or the assistance of another person to leave their place of residence. There is a normal inability to leave the home and doing so requires considerable and taxing effort. Other absences are for medical reasons / religious services and are infrequent or of short duration when for other reasons). o If current dressing causes  regression in wound condition, may D/C ordered dressing product/s and apply Normal Saline Moist Dressing daily until next [place] / Other MD appointment. [place] of regression in wound condition at [phone]. o Please direct any NON-WOUND related issues/requests for orders to patient's Primary Care Physician Electronic Signature(s) Signed: 03/10/2019 5:36:25 PM By: Montey Hora Signed: 03/11/2019 1:30:00 AM By: Worthy Keeler PA-C Entered By: Montey Hora on 03/10/2019 10:08:17 [name], [name] ([id number]) -------------------------------------------------------------------------------- Problem List Details Patient Name: [name], [name] Date of Service: 03/10/2019 9:45 AM Medical Record Number: [id number] Patient Account Number: [id number] Date of Birth/Sex: 21-Feb-1928 (85 y.o. 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 ([id number]) -------------------------------------------------------------------------------- Progress Note Details Patient Name: [name], [name] Date of Service: 03/10/2019 9:45 AM Medical Record Number: [id number] Patient Account Number: [id number] Date of Birth/Sex: [birth year] (85 y.o. F) Treating RN: Montey Hora Primary Care Provider: Park Liter Other Clinician: Referring Provider: Referral, Self Treating Provider/Extender:  [name], [name] Weeks in Treatment: 0 Subjective Chief Complaint Information obtained from Patient Right leg ulcers History of Present Illness (HPI) 02/12/18 ADMISSION This is an 85 year old woman who is recently moved to [place] from [place]. Her story began in February where she fell on some ice suffering extensive lacerations to her bilateral lower extremities. She is able to show me extensive pictures of the right lower leg but with going to a wound care center and [place] 3 times a week these eventually closed over. She has been left with 1 area on the upper lateral left calf. She has been applying Neosporin to this and applying a bandage. Currently this measures 2 x 1.5 cm. The patient is not a diabetic. She is an ex-smoker quitting 40 years ago.  She does have COPD by description. ABIs in our clinic were 1.32 on the right and 1.03 on the left 02/19/18; right lower leg wound which was initially trauma. The area that we look that last week is smaller. Still covered in a nonviable surface however. We are using Iodoflex 02/26/18; right lower leg wound which was initially trauma in the setting of chronic venous insufficiency. Surface of the wound looks much better healthy granulation advancing epithelialization. We have good edema control 03/05/18; right lower leg wound which was initially trauma in the setting of chronic venous insufficiency. She continues to make nice progress here. We have good edema control He arrives today with a new traumatic laceration on her right dorsal forearm which she states happened while she was putting on a sweatshirt 03/12/18; right lower leg wound as closed however it still looks vulnerable. She has chronic venous insufficiency. The new wound from last week a traumatic area on her right dorsal or arm unfortunately does not have a viable surface. I remove some nonviable skin and necrotic subcutaneous debris from the wound surface. Hemostasis with silver  nitrate and direct pressure 03/19/18; right lower leg wound is closed. She has chronic venous insufficiency and will need ongoing compression The new wounds from 2 weeks ago on the right upper elbow is closed. The area on her right dorsal forearm continues to have a nonviable surface requiring debridement 03/26/18; we'll need to look into ongoing compression for her legs. 2 wounds on the right upper elbow is closed. The area on the right dorsal forearm looks better. Hydrofera Blue secondary to hypertrophic granulation 04/02/18; he has compression stockings although she did not wear them today. Severe chronic venous insufficiency. She has 2 wounds on the right upper elbow which I said were closed last week which actually are although they're very small and she has a smaller clean wound on the right dorsal forearm. We've been using Hydrofera Blue secondary to some upper granulation. All of this looks better 04/09/18-She is seen in follow up evaluation for a right elbow and right forearm skin tear. The right elbow is healed. We will continue with same treatment plan and she'll follow next week 04/16/18-She is seen in follow-up evaluation for right forearm skin tear, this is essentially healed. We will cover with foam border and she will follow-up next week 04/23/18; the patient arrives with a new skin tear on her right dorsal arm just below the elbow. She got this while carrying a box. She has a linear skin tear. There is no depth I don't think this should've been sutured. There is a skin flap medially I'm not sure if this will be viable or not 04/30/18; the new skin tear from last week unfortunately has remained viable in terms of the skin flap. She has a small open area that looks healthy. Using moistened silver collagen 05/07/18; right dorsal arm skin tear. Nonviable tissue over the remnants of the wound. We've been using silver collagen ERICIA, MOXLEY ([id number]) 05/21/18; right dorsal arm skin  tear. Nonviable tissue over a small remnant of the wound was washed off. We've been using silver collagen which we will continue 06/04/18; the right dorsal arm skin tear has healed. She arrives today with a traumatic wound just above the olecranon of her left elbow. This is a skin tear with a nonviable nonadherent flap which was removed. We'll use silver collagen here. Also noteworthy she arrives without her compression stockings 06/18/18; the area just above the olecranon of her  left elbow. It is smaller and generally has a healthy surface. I was surprised to learn that the patient is actually changing this herself although. Appears this week she was putting some topical lidocaine on this that she bought over-the-counter at the drugstore. She did get supplies at home she didn't know how to put it on. She drove herself here today i.e. not eligible for home health Readmission: 11/04/18 on evaluation today patient presents for evaluation our clinic news to include the she has on her lower extremity which occurred as the result of having hit this on a piece of furniture. This was back in December. She states that her daughter has been trying to get her to come into have this evaluated here the wound care center but she was being somewhat stubborn. Nonetheless upon evaluation today the wound really appears to be potentially healed she does have some swelling of lower extremity which I think will come into play. With that being said he tells me that even yesterday this was still making clear fluid and therefore there may be some issues with continued problems with the swelling and drainage secondary to her venous stasis. Nonetheless overall wound appears to be doing fairly well which is good news. 11/11/18 on evaluation today patient actually appears to be doing excellent in the area of question that was green just the day before he saw her last time actually appears to have not drain that always in past  week. I think this is done very well and at this point though she's had some discomfort I do not see any evidence of open wound which he needs to continue see wound care. Readmission: 03/10/19 on evaluation today patient presents for reevaluation here in our clinic concerning issues that she actually is having with the new problem on her right lower leg due to trauma that occurred when a car door shut on her leg last Thursday. This is not been quite a week and she does have several skin tears unfortunately that resulted from this injury. Fortunately there's no signs of active infection at this time. No fevers, chills, nausea, or vomiting noted at this time. She does have some hot tissue noted at this point that has me concerned about the possibility of needing to move this in order to allow the areas to heal appropriately. Fortunately there's no signs of active infection at this time. Patient History Information obtained from Patient. Allergies No Known Allergies Family History Cancer - Siblings,Paternal Grandparents, Diabetes - Siblings, Heart Disease - Mother,Father,Child,Paternal Grandparents, Hypertension - Child, No family history of Hereditary Spherocytosis, Kidney Disease, Lung Disease, Seizures, Stroke, Thyroid Problems, Tuberculosis. Social History Former smoker - quit 40 yrs ago, Marital Status - Divorced, Alcohol Use - Never, Drug Use - No History, Caffeine Use - Rarely. Medical History Eyes Patient has history of Cataracts - surgery 2000, Glaucoma Respiratory Patient has history of Asthma, Chronic Obstructive Pulmonary Disease (COPD) Denies history of Pneumothorax, Sleep Apnea, Tuberculosis Cardiovascular Patient has history of Arrhythmia - a-fib, Hypertension Braver, Caryle H. ([id number]) Denies history of Angina, Congestive Heart Failure, Coronary Artery Disease, Deep Vein Thrombosis, Hypotension, Myocardial Infarction, Peripheral Arterial Disease, Peripheral Venous  Disease, Phlebitis, Vasculitis Endocrine Denies history of Type I Diabetes, Type II Diabetes Genitourinary Denies history of End Stage Renal Disease Immunological Denies history of Lupus Erythematosus, Raynaud s, Scleroderma Integumentary (Skin) Denies history of History of Burn, History of pressure wounds Musculoskeletal Denies history of Gout, Rheumatoid Arthritis, Osteoarthritis, Osteomyelitis Neurologic Denies history of Dementia, Neuropathy,  Quadriplegia, Paraplegia, Seizure Disorder Oncologic Denies history of Received Chemotherapy, Received Radiation Psychiatric Denies history of Anorexia/bulimia, Confinement Anxiety Hospitalization/Surgery History - Colon cancer. Objective Constitutional patient is hypertensive.. pulse regular and within target range for patient.Marland Kitchen respirations regular, non-labored and within target range for patient.Marland Kitchen temperature within target range for patient.. Well-nourished and well-hydrated in no acute distress. Vitals Time Taken: 9:29 AM, Height: 60 in, Source: Stated, Weight: 92 lbs, Source: Stated, BMI: 18, Temperature: 98.0 F, Pulse: 68 bpm, Respiratory Rate: 16 breaths/min, Blood Pressure: 153/48 mmHg. Eyes conjunctiva clear no eyelid edema noted. pupils equal round and reactive to light and accommodation. Ears, Nose, Mouth, and Throat no gross abnormality of ear auricles or external auditory canals. normal hearing noted during conversation. mucus membranes moist. Respiratory normal breathing without difficulty. clear to auscultation bilaterally. Cardiovascular regular rate and rhythm with normal S1, S2. 2+ dorsalis pedis/posterior tibialis pulses. no clubbing, cyanosis, significant edema, Gastrointestinal (GI) soft, non-tender, non-distended, +BS. no ventral hernia noted. Musculoskeletal normal gait and posture. no significant deformity or arthritic changes, no loss or range of motion, no clubbing. [name], [name]  ([id number]) Psychiatric this patient is able to make decisions and demonstrates good insight into disease process. Alert and Oriented x 3. pleasant and cooperative. General Notes: Upon inspection patient's wound bed actually showed signs of good granulation at this time. Fortunately there did appear to be some excellent healing unfortunately she did have some areas as well where there were necrotic flaps that did need to be removed specifically on the medial ankle as well as the anterior right lower extremity. Sharp debridement was performed today to remove these areas to allow for more appropriate of complete healing without complication and she tolerated this very well today. Integumentary (Hair, Skin) Wound #10 status is Open. Original cause of wound was Trauma. The wound is located on the Right,Distal Lower Leg. The wound measures 0.8cm length x 0.5cm width x 0.1cm depth; 0.314cm^2 area and 0.031cm^3 volume. The wound is limited to skin breakdown. There is no tunneling or undermining noted. There is a medium amount of serosanguineous drainage noted. There is large (67-100%) red granulation within the wound bed. There is no necrotic tissue within the wound bed. Wound #11 status is Open. Original cause of wound was Trauma. The wound is located on the Right,Lateral Lower Leg. The wound measures 1.6cm length x 0.3cm width x 0.1cm depth; 0.377cm^2 area and 0.038cm^3 volume. The wound is limited to skin breakdown. There is no tunneling or undermining noted. There is a medium amount of serosanguineous drainage noted. There is large (67-100%) red granulation within the wound bed. There is no necrotic tissue within the wound bed. Wound #7 status is Open. Original cause of wound was Trauma. The wound is located on the Right,Medial Lower Leg. The wound measures 0.4cm length x 1.5cm width x 0.1cm depth; 0.471cm^2 area and 0.047cm^3 volume. There is Fat Layer (Subcutaneous Tissue) Exposed exposed. There  is no tunneling or undermining noted. There is a medium amount of serosanguineous drainage noted. The wound margin is flat and intact. There is medium (34-66%) red granulation within the wound bed. There is a medium (34-66%) amount of necrotic tissue within the wound bed including Eschar. Wound #8 status is Open. Original cause of wound was Trauma. The wound is located on the Right,Anterior Lower Leg. The wound measures 1.1cm length x 2.2cm width x 0.1cm depth; 1.901cm^2 area and 0.19cm^3 volume. There is Fat Layer (Subcutaneous Tissue) Exposed exposed. There is no tunneling or undermining noted.  There is a medium amount of serosanguineous drainage noted. The wound margin is flat and intact. There is medium (34-66%) red granulation within the wound bed. There is a medium (34-66%) amount of necrotic tissue within the wound bed including Eschar. Wound #9 status is Open. Original cause of wound was Trauma. The wound is located on the Right,Proximal Lower Leg. The wound measures 0.5cm length x 1.1cm width x 0.1cm depth; 0.432cm^2 area and 0.043cm^3 volume. The wound is limited to skin breakdown. There is no tunneling or undermining noted. There is a medium amount of serosanguineous drainage noted. There is medium (34-66%) granulation within the wound bed. There is a small (1-33%) amount of necrotic tissue within the wound bed including Eschar. Assessment Active Problems ICD-10 Venous insufficiency (chronic) (peripheral) Non-pressure chronic ulcer of other part of right lower leg with fat layer exposed Procedures Tupou, Donalee H. ([id number]) Wound #7 Pre-procedure diagnosis of Wound #7 is a Trauma, Other located on the Right,Medial Lower Leg . There was a Excisional Skin/Subcutaneous Tissue Debridement with a total area of 0.6 sq cm performed by STONE III, [name] E., PA-C. With the following instrument(s): Curette, and Forceps to remove Viable and Non-Viable tissue/material. Material removed  includes Subcutaneous Tissue, Skin: Dermis, and Skin: Epidermis after achieving pain control using Other (benzocaine 20%). No specimens were taken. A time out was conducted at 10:03, prior to the start of the procedure. A Minimum amount of bleeding was controlled with Pressure. The procedure was tolerated well with a pain level of 0 throughout and a pain level of 0 following the procedure. Post Debridement Measurements: 0.4cm length x 1.5cm width x 0.3cm depth; 0.141cm^3 volume. Character of Wound/Ulcer Post Debridement is improved. Post procedure Diagnosis Wound #7: Same as Pre-Procedure Wound #8 Pre-procedure diagnosis of Wound #8 is a Trauma, Other located on the Right,Anterior Lower Leg . There was a Excisional Skin/Subcutaneous Tissue Debridement with a total area of 2.42 sq cm performed by STONE III, [name] E., PA-C. With the following instrument(s): Curette, Forceps, and Scissors to remove Viable and Non-Viable tissue/material. Material removed includes Blood Clots, Subcutaneous Tissue, Skin: Dermis, and Skin: Epidermis after achieving pain control using Other (benzocaine 20%). No specimens were taken. A time out was conducted at 09:57, prior to the start of the procedure. A Minimum amount of bleeding was controlled with Pressure. The procedure was tolerated well with a pain level of 0 throughout and a pain level of 0 following the procedure. Post Debridement Measurements: 1.1cm length x 2.2cm width x 0.2cm depth; 0.38cm^3 volume. Character of Wound/Ulcer Post Debridement is improved. Post procedure Diagnosis Wound #8: Same as Pre-Procedure Plan Wound Cleansing: Wound #10 Right,Distal Lower Leg: Clean wound with Normal Saline. Dial antibacterial soap, wash wounds, rinse and pat dry prior to dressing wounds May Shower, gently pat wound dry prior to applying new dressing. Wound #11 Right,Lateral Lower Leg: Clean wound with Normal Saline. Dial antibacterial soap, wash wounds, rinse and  pat dry prior to dressing wounds May Shower, gently pat wound dry prior to applying new dressing. Wound #7 Right,Medial Lower Leg: Clean wound with Normal Saline. Dial antibacterial soap, wash wounds, rinse and pat dry prior to dressing wounds May Shower, gently pat wound dry prior to applying new dressing. Wound #8 Right,Anterior Lower Leg: Clean wound with Normal Saline. Dial antibacterial soap, wash wounds, rinse and pat dry prior to dressing wounds May Shower, gently pat wound dry prior to applying new dressing. Wound #9 Right,Proximal Lower Leg: Clean wound with Normal Saline. Dial  antibacterial soap, wash wounds, rinse and pat dry prior to dressing wounds May Shower, gently pat wound dry prior to applying new dressing. Anesthetic (add to Medication List): Wound #10 Right,Distal Lower Leg: Topical Lidocaine 4% cream applied to wound bed prior to debridement (In Clinic Only). Wound #11 Right,Lateral Lower Leg: Topical Lidocaine 4% cream applied to wound bed prior to debridement (In Clinic Only). Wound #7 Right,Medial Lower Leg: Fergeson, Camari H. ([id number]) Topical Lidocaine 4% cream applied to wound bed prior to debridement (In Clinic Only). Wound #8 Right,Anterior Lower Leg: Topical Lidocaine 4% cream applied to wound bed prior to debridement (In Clinic Only). Wound #9 Right,Proximal Lower Leg: Topical Lidocaine 4% cream applied to wound bed prior to debridement (In Clinic Only). Primary Wound Dressing: Wound #10 Right,Distal Lower Leg: Xeroform Wound #11 Right,Lateral Lower Leg: Xeroform Wound #9 Right,Proximal Lower Leg: Xeroform Wound #7 Right,Medial Lower Leg: Silver Collagen - to wound bed Xeroform - over silver collagen Wound #8 Right,Anterior Lower Leg: Silver Collagen - to wound bed Xeroform - over silver collagen Secondary Dressing: Wound #10 Right,Distal Lower Leg: ABD and Kerlix/Conform - secure with netting Wound #11 Right,Lateral Lower Leg: ABD and  Kerlix/Conform - secure with netting Wound #7 Right,Medial Lower Leg: ABD and Kerlix/Conform - secure with netting Wound #8 Right,Anterior Lower Leg: ABD and Kerlix/Conform - secure with netting Wound #9 Right,Proximal Lower Leg: ABD and Kerlix/Conform - secure with netting Dressing Change Frequency: Wound #10 Right,Distal Lower Leg: Change Dressing Monday, Wednesday, Friday Wound #11 Right,Lateral Lower Leg: Change Dressing Monday, Wednesday, Friday Wound #7 Right,Medial Lower Leg: Change Dressing Monday, Wednesday, Friday Wound #8 Right,Anterior Lower Leg: Change Dressing Monday, Wednesday, Friday Wound #9 Right,Proximal Lower Leg: Change Dressing Monday, Wednesday, Friday Follow-up Appointments: Return Appointment in 1 week. Home Health: Wound #10 Right,Distal Lower Leg: [place] for [place] Nurse may visit PRN to address patient s wound care needs. FACE TO FACE ENCOUNTER: MEDICARE and MEDICAID PATIENTS: I certify that this patient is under my care and that I had a face-to-face encounter that meets the physician face-to-face encounter requirements with this patient on this date. The encounter with the patient was in whole or in part for the following MEDICAL CONDITION: (primary reason for [place]) MEDICAL NECESSITY: I certify, that based on my findings, NURSING services are a medically necessary home health service. HOME BOUND STATUS: I certify that my clinical findings support that this patient is homebound (i.e., Due to illness or injury, pt requires aid of supportive devices such as crutches, cane, wheelchairs, walkers, the use of special transportation or the assistance of another person to leave their place of residence. There is a normal inability to leave the home and doing so requires considerable and taxing effort. Other absences are for medical reasons / religious services and are infrequent or of short duration when for other  reasons). If current dressing causes regression in wound condition, may D/C ordered dressing product/s and apply Normal Saline Moist Dressing daily until next [place] / Other MD appointment. [place] of regression in wound condition at [phone]. Please direct any NON-WOUND related issues/requests for orders to patient's Primary Care Physician Wound #11 Right,Lateral Lower Leg: ANABELA, CRAYTON ([id number]) [place] for [place] Nurse may visit PRN to address patient s wound care needs. FACE TO FACE ENCOUNTER: MEDICARE and MEDICAID PATIENTS: I certify that this patient is under my care and that I had a face-to-face encounter that meets  the physician face-to-face encounter requirements with this patient on this date. The encounter with the patient was in whole or in part for the following MEDICAL CONDITION: (primary reason for [place]) MEDICAL NECESSITY: I certify, that based on my findings, NURSING services are a medically necessary home health service. HOME BOUND STATUS: I certify that my clinical findings support that this patient is homebound (i.e., Due to illness or injury, pt requires aid of supportive devices such as crutches, cane, wheelchairs, walkers, the use of special transportation or the assistance of another person to leave their place of residence. There is a normal inability to leave the home and doing so requires considerable and taxing effort. Other absences are for medical reasons / religious services and are infrequent or of short duration when for other reasons). If current dressing causes regression in wound condition, may D/C ordered dressing product/s and apply Normal Saline Moist Dressing daily until next [place] / Other MD appointment. [place] of regression in wound condition at [phone]. Please direct any NON-WOUND related issues/requests for orders to  patient's Primary Care Physician Wound #7 Right,Medial Lower Leg: [place] for [place] Nurse may visit PRN to address patient s wound care needs. FACE TO FACE ENCOUNTER: MEDICARE and MEDICAID PATIENTS: I certify that this patient is under my care and that I had a face-to-face encounter that meets the physician face-to-face encounter requirements with this patient on this date. The encounter with the patient was in whole or in part for the following MEDICAL CONDITION: (primary reason for [place]) MEDICAL NECESSITY: I certify, that based on my findings, NURSING services are a medically necessary home health service. HOME BOUND STATUS: I certify that my clinical findings support that this patient is homebound (i.e., Due to illness or injury, pt requires aid of supportive devices such as crutches, cane, wheelchairs, walkers, the use of special transportation or the assistance of another person to leave their place of residence. There is a normal inability to leave the home and doing so requires considerable and taxing effort. Other absences are for medical reasons / religious services and are infrequent or of short duration when for other reasons). If current dressing causes regression in wound condition, may D/C ordered dressing product/s and apply Normal Saline Moist Dressing daily until next [place] / Other MD appointment. [place] of regression in wound condition at [phone]. Please direct any NON-WOUND related issues/requests for orders to patient's Primary Care Physician Wound #8 Right,Anterior Lower Leg: [place] for [place] Nurse may visit PRN to address patient s wound care needs. FACE TO FACE ENCOUNTER: MEDICARE and MEDICAID PATIENTS: I certify that this patient is under my care and that I had a face-to-face encounter that meets the physician face-to-face encounter requirements with  this patient on this date. The encounter with the patient was in whole or in part for the following MEDICAL CONDITION: (primary reason for [place]) MEDICAL NECESSITY: I certify, that based on my findings, NURSING services are a medically necessary home health service. HOME BOUND STATUS: I certify that my clinical findings support that this patient is homebound (i.e., Due to illness or injury, pt requires aid of supportive devices such as crutches, cane, wheelchairs, walkers, the use of special transportation or the assistance of another person to leave their place of residence. There is a normal inability to leave the home and doing so requires considerable and taxing effort. Other  absences are for medical reasons / religious services and are infrequent or of short duration when for other reasons). If current dressing causes regression in wound condition, may D/C ordered dressing product/s and apply Normal Saline Moist Dressing daily until next [place] / Other MD appointment. [place] of regression in wound condition at [phone]. Please direct any NON-WOUND related issues/requests for orders to patient's Primary Care Physician Wound #9 Right,Proximal Lower Leg: [place] for [place] Nurse may visit PRN to address patient s wound care needs. FACE TO FACE ENCOUNTER: MEDICARE and MEDICAID PATIENTS: I certify that this patient is under my care and that I had a face-to-face encounter that meets the physician face-to-face encounter requirements with this patient on this date. The encounter with the patient was in whole or in part for the following MEDICAL CONDITION: (primary reason for [place]) MEDICAL NECESSITY: I certify, that based on my findings, NURSING services are a medically necessary home health service. HOME BOUND STATUS: I certify that my clinical findings support that this patient is homebound (i.e., Due  to illness or injury, pt requires aid of supportive devices such as crutches, cane, wheelchairs, walkers, the use of special transportation or the assistance of another person to leave their place of residence. There is a normal inability to leave the home and doing so requires considerable and taxing effort. Other absences are for medical reasons / religious services and are infrequent or of short duration when for other reasons). If current dressing causes regression in wound condition, may D/C ordered dressing product/s and apply Normal Saline Nguyen, Syriana H. ([id number]) Moist Dressing daily until next [place] / Other MD appointment. [place] of regression in wound condition at [phone]. Please direct any NON-WOUND related issues/requests for orders to patient's Primary Care Physician My suggestion at this point is gonna be that we continue with the above wound to measures for the next week and the patient is in agreement with plan. We will subsequently see were things stand at follow-up. Thinking changes or worsens in the meantime she will contact the office and let me know. Please see above for specific wound care orders. We will see patient for re-evaluation in 1 week(s) here in the clinic. If anything worsens or changes patient will contact our office for additional recommendations. Electronic Signature(s) Signed: 03/11/2019 1:30:00 AM By: Worthy Keeler PA-C Entered By: Worthy Keeler on 03/10/2019 17:26:51 JAILEIGH, WEIMER ([id number]) -------------------------------------------------------------------------------- ROS/PFSH Details Patient Name: [name]. Date of Service: 03/10/2019 9:45 AM Medical Record Number: [id number] Patient Account Number: [id number] Date of Birth/Sex: [birth year] (85 y.o. F) Treating RN: Harold Barban Primary Care Provider: Park Liter Other Clinician: Referring Provider: Referral, Self Treating  Provider/Extender: [name], [name] Weeks in Treatment: 0 Information Obtained From Patient Eyes Medical History: Positive for: Cataracts - surgery 2000; Glaucoma Respiratory Medical History: Positive for: Asthma; Chronic Obstructive Pulmonary Disease (COPD) Negative for: Pneumothorax; Sleep Apnea; Tuberculosis Cardiovascular Medical History: Positive for: Arrhythmia - a-fib; Hypertension Negative for: Angina; Congestive Heart Failure; Coronary Artery Disease; Deep Vein Thrombosis; Hypotension; Myocardial Infarction; Peripheral Arterial Disease; Peripheral Venous Disease; Phlebitis; Vasculitis Endocrine Medical History: Negative for: Type I Diabetes; Type II Diabetes Genitourinary Medical History: Negative for: End Stage Renal Disease Immunological Medical History: Negative for: Lupus Erythematosus; Raynaudos; Scleroderma Integumentary (Skin) Medical History: Negative for: History of Burn; History of pressure wounds Musculoskeletal Medical History: Negative for: Gout; Rheumatoid Arthritis; Osteoarthritis; Osteomyelitis Neurologic Medical History:  Negative for: Dementia; Neuropathy; Quadriplegia; Paraplegia; Seizure Disorder SHANYAH, GATTUSO ([id number]) Oncologic Medical History: Negative for: Received Chemotherapy; Received Radiation Psychiatric Medical History: Negative for: Anorexia/bulimia; Confinement Anxiety HBO Extended History Items Eyes: Eyes: Cataracts Glaucoma Immunizations Pneumococcal Vaccine: Received Pneumococcal Vaccination: Yes Implantable Devices None Hospitalization / Surgery History Type of Hospitalization/Surgery Colon cancer Family and Social History Cancer: Yes - Siblings,Paternal Grandparents; Diabetes: Yes - Siblings; Heart Disease: Yes - Mother,Father,Child,Paternal Grandparents; Hereditary Spherocytosis: No; Hypertension: Yes - Child; Kidney Disease: No; Lung Disease: No; Seizures: No; Stroke: No; Thyroid Problems: No; Tuberculosis:  No; Former smoker - quit 40 yrs ago; Marital Status - Divorced; Alcohol Use: Never; Drug Use: No History; Caffeine Use: Rarely; Financial Concerns: No; Food, Clothing or Shelter Needs: No; Support System Lacking: No; Transportation Concerns: No Electronic Signature(s) Signed: 03/10/2019 2:42:48 PM By: Harold Barban Signed: 03/11/2019 1:30:00 AM By: Worthy Keeler PA-C Entered By: Harold Barban on 03/10/2019 09:34:32 Spike, Ashe H. ([id number]) -------------------------------------------------------------------------------- SuperBill Details Patient Name: Thorner, Mikena H. Date of Service: 03/10/2019 Medical Record Number: [id number] Patient Account Number: [id number] Date of Birth/Sex: 01-30-1928 (85 y.o. F) Treating RN: Montey Hora Primary Care Provider: Park Liter Other Clinician: Referring Provider: Referral, Self Treating Provider/Extender: [name], [name] Weeks in Treatment: 0 Diagnosis Coding ICD-10 Codes Code Description I87.2 Venous insufficiency (chronic) (peripheral) L97.812 Non-pressure chronic ulcer of other part of right lower leg with fat layer exposed Facility Procedures CPT4 Code Description: 94801655 99213 - WOUND CARE VISIT-LEV 3 EST PT Modifier: Quantity: 1 CPT4 Code Description: [id number] [place] - DEB SUBQ TISSUE 20 SQ CM/< ICD-10 Diagnosis Description E67.544 Non-pressure chronic ulcer of other part of right lower leg wit I87.2 Venous insufficiency (chronic) (peripheral) Modifier: h fat layer expo Quantity: 1 sed Physician Procedures CPT4 Code Description: 9201007 12197 - WC PHYS LEVEL 4 - EST PT ICD-10 Diagnosis Description I87.2 Venous insufficiency (chronic) (peripheral) L97.812 Non-pressure chronic ulcer of other part of right lower leg wit Modifier: 25 h fat layer expo Quantity: 1 sed CPT4 Code Description: [id number] 98264 - WC PHYS SUBQ TISS 20 SQ CM ICD-10 Diagnosis Description B58.309 Non-pressure chronic ulcer of other part of right lower leg  wit I87.2 Venous insufficiency (chronic) (peripheral) Modifier: h fat layer expo Quantity: 1 sed Electronic Signature(s) Signed: 03/11/2019 1:30:00 AM By: Worthy Keeler PA-C Previous Signature: 03/10/2019 10:22:29 AM Version By: Montey Hora Entered By: Worthy Keeler on 03/10/2019 17:27:14

## 2019-03-11 NOTE — Progress Notes (Signed)
AKIKO, SCHEXNIDER ([id number]) Visit Report for 03/10/2019 Allergy List Details Patient Name: [name], [name]. Date of Service: 03/10/2019 9:45 AM Medical Record Number: [id number] Patient Account Number: [id number] Date of Birth/Sex: 22-Dec-1927 (85 y.o. F) Treating RN: Harold Barban Primary Care Leea Rambeau: Park Liter Other Clinician: Referring Inza Mikrut: Referral, Self Treating Zayyan Mullen/Extender: STONE III, [name] Weeks in Treatment: 0 Allergies Active Allergies No Known Allergies Allergy Notes Electronic Signature(s) Signed: 03/10/2019 2:42:48 PM By: Harold Barban Entered By: Harold Barban on 03/10/2019 09:30:49 Wollschlager, [name] ([id number]) -------------------------------------------------------------------------------- Arrival Information Details Patient Name: [name] Date of Service: 03/10/2019 9:45 AM Medical Record Number: [id number] Patient Account Number: [id number] Date of Birth/Sex: 07-14-1928 (85 y.o. F) Treating RN: Harold Barban Primary Care Vanda Waskey: Park Liter Other Clinician: Referring Analese Sovine: Referral, Self Treating Angelie Kram/Extender: [name], [name] Weeks in Treatment: 0 Visit Information Patient Arrived: Ambulatory Arrival Time: 09:24 Accompanied By: self Transfer Assistance: None Patient Identification Verified: Yes Secondary Verification Process Completed: Yes History Since Last Visit Added or deleted any medications: No Any new allergies or adverse reactions: No Had a fall or experienced change in activities of daily living that may affect risk of falls: No Signs or symptoms of abuse/neglect since last visito No Hospitalized since last visit: No Electronic Signature(s) Signed: 03/10/2019 2:42:48 PM By: Harold Barban Entered By: Harold Barban on 03/10/2019 09:26:33 Heitmeyer, [name] ([id number]) -------------------------------------------------------------------------------- Clinic Level of Care Assessment  Details Patient Name: Boak, Doretta H. Date of Service: 03/10/2019 9:45 AM Medical Record Number: [id number] Patient Account Number: [id number] Date of Birth/Sex: 02/27/1928 (85 y.o. F) Treating RN: Montey Hora Primary Care Bedford Winsor: Park Liter Other Clinician: Referring Nikyah Lackman: Referral, Self Treating Heatherly Stenner/Extender: STONE III, [name] Weeks in Treatment: 0 Clinic Level of Care Assessment Items TOOL 1 Quantity Score []  - Use when EandM and Procedure is performed on INITIAL visit 0 ASSESSMENTS - Nursing Assessment / Reassessment X - General Physical Exam (combine w/ comprehensive assessment (listed just below) when 1 20 performed on new pt. evals) X- 1 25 Comprehensive Assessment (HX, ROS, Risk Assessments, Wounds Hx, etc.) ASSESSMENTS - Wound and Skin Assessment / Reassessment []  - Dermatologic / Skin Assessment (not related to wound area) 0 ASSESSMENTS - Ostomy and/or Continence Assessment and Care []  - Incontinence Assessment and Management 0 []  - 0 Ostomy Care Assessment and Management (repouching, etc.) PROCESS - Coordination of Care X - Simple Patient / Family Education for ongoing care 1 15 []  - 0 Complex (extensive) Patient / Family Education for ongoing care X- 1 10 Staff obtains Programmer, systems, Records, Test Results / Process Orders []  - 0 Staff telephones HHA, Nursing Homes / Clarify orders / etc []  - 0 Routine Transfer to another Facility (non-emergent condition) []  - 0 Routine Hospital Admission (non-emergent condition) X- 1 15 New Admissions / Biomedical engineer / Ordering NPWT, Apligraf, etc. []  - 0 Emergency Hospital Admission (emergent condition) PROCESS - Special Needs []  - Pediatric / Minor Patient Management 0 []  - 0 Isolation Patient Management []  - 0 Hearing / Language / Visual special needs []  - 0 Assessment of Community assistance (transportation, D/C planning, etc.) []  - 0 Additional assistance / Altered mentation []  - 0 Support  Surface(s) Assessment (bed, cushion, seat, etc.) Kong, Maddalyn H. ([id number]) INTERVENTIONS - Miscellaneous []  - External ear exam 0 []  - 0 Patient Transfer (multiple staff / Civil Service fast streamer / Similar devices) []  - 0 Simple Staple / Suture removal (25 or less) []  - 0 Complex Staple / Suture removal (26 or more) []  -  0 Hypo/Hyperglycemic Management (do not check if billed separately) []  - 0 Ankle / Brachial Index (ABI) - do not check if billed separately Has the patient been seen at the hospital within the last three years: Yes Total Score: 85 Level Of Care: ____ Electronic Signature(s) Signed: 03/10/2019 5:36:25 PM By: Montey Hora Entered By: Montey Hora on 03/10/2019 10:22:07 Labra, [name] ([id number]) -------------------------------------------------------------------------------- Lower Extremity Assessment Details Patient Name: Karrer, Lynnsie H. Date of Service: 03/10/2019 9:45 AM Medical Record Number: [id number] Patient Account Number: [id number] Date of Birth/Sex: [birth year] (85 y.o. F) Treating RN: Harold Barban Primary Care Neeti Knudtson: Park Liter Other Clinician: Referring Zyair Russi: Referral, Self Treating Telford Archambeau/Extender: STONE III, [name] Weeks in Treatment: 0 Edema Assessment Assessed: [Left: No] [Right: No] [Left: Edema] [Right: :] Calf Left: Right: Point of Measurement: 32 cm From Medial Instep cm 29 cm Ankle Left: Right: Point of Measurement: 10 cm From Medial Instep cm 18 cm Vascular Assessment Pulses: Dorsalis Pedis Palpable: [Right:Yes] Posterior Tibial Palpable: [Right:Yes] Electronic Signature(s) Signed: 03/10/2019 2:42:48 PM By: Harold Barban Entered By: Harold Barban on 03/10/2019 09:38:55 Enzor, Paiton H. ([id number]) -------------------------------------------------------------------------------- Multi Wound Chart Details Patient Name: Hayworth, Nonna H. Date of Service: 03/10/2019 9:45 AM Medical Record Number:  [id number] Patient Account Number: [id number] Date of Birth/Sex: Mar 20, 1928 (85 y.o. F) Treating RN: Montey Hora Primary Care Yaphet Smethurst: Park Liter Other Clinician: Referring Abdo Denault: Referral, Self Treating Ethon Wymer/Extender: STONE III, [name] Weeks in Treatment: 0 Vital Signs Height(in): 60 Pulse(bpm): 71 Weight(lbs): 19 Blood Pressure(mmHg): 153/48 Body Mass Index(BMI): 18 Temperature(F): 98.0 Respiratory Rate 16 (breaths/min): Photos: Wound Location: Right Lower Leg - Distal Right Lower Leg - Lateral Right Lower Leg - Medial Wounding Event: Trauma Trauma Trauma Primary Etiology: Trauma, Other Trauma, Other Trauma, Other Comorbid History: Cataracts, Glaucoma, Asthma, Cataracts, Glaucoma, Asthma, Cataracts, Glaucoma, Asthma, Chronic Obstructive Chronic Obstructive Chronic Obstructive Pulmonary Disease (COPD), Pulmonary Disease (COPD), Pulmonary Disease (COPD), Arrhythmia, Hypertension Arrhythmia, Hypertension Arrhythmia, Hypertension Date Acquired: 03/05/2019 03/05/2019 03/05/2019 Weeks of Treatment: 0 0 0 Wound Status: Open Open Open Measurements L x W x D 0.8x0.5x0.1 1.6x0.3x0.1 0.4x1.5x0.1 (cm) Area (cm) : 0.314 0.377 0.471 Volume (cm) : 0.031 0.038 0.047 % Reduction in Area: 0.00% 0.00% 0.00% % Reduction in Volume: 0.00% 0.00% 0.00% Classification: Partial Thickness Partial Thickness Full Thickness Without Exposed Support Structures Exudate Amount: Medium Medium Medium Exudate Type: Serosanguineous Serosanguineous Serosanguineous Exudate Color: red, brown red, brown red, brown Wound Margin: N/A N/A Flat and Intact Granulation Amount: Large (67-100%) Large (67-100%) Medium (34-66%) Granulation Quality: Red Red Red Necrotic Amount: None Present (0%) None Present (0%) Medium (34-66%) Necrotic Tissue: N/A N/A Eschar Exposed Structures: Fascia: No Fascia: No Fat Layer (Subcutaneous Fat Layer (Subcutaneous Fat Layer (Subcutaneous Tissue) Exposed: Yes Tissue)  Exposed: No Tissue) Exposed: No Fascia: No Verville, Jadda H. ([id number]) Tendon: No Tendon: No Tendon: No Muscle: No Muscle: No Muscle: No Joint: No Joint: No Joint: No Bone: No Bone: No Bone: No Limited to Skin Breakdown Limited to Skin Breakdown Epithelialization: None None None Debridement: N/A N/A Debridement - Excisional Pre-procedure N/A N/A 10:03 Verification/Time Out Taken: Pain Control: N/A N/A Other Tissue Debrided: N/A N/A Subcutaneous Level: N/A N/A Skin/Subcutaneous Tissue Debridement Area (sq cm): N/A N/A 0.6 Instrument: N/A N/A Curette, Forceps Bleeding: N/A N/A Minimum Hemostasis Achieved: N/A N/A Pressure Procedural Pain: N/A N/A 0 Post Procedural Pain: N/A N/A 0 Debridement Treatment N/A N/A Procedure was tolerated well Response: Post Debridement N/A N/A 0.4x1.5x0.3 Measurements L x W x D (cm) Post Debridement Volume: N/A N/A 0.141 (  cm) Procedures Performed: N/A N/A Debridement Wound Number: [id number] N/A Photos: N/A Wound Location: Right Lower Leg - Anterior Right Lower Leg - Proximal N/A Wounding Event: Trauma Trauma N/A Primary Etiology: Trauma, Other Trauma, Other N/A Comorbid History: Cataracts, Glaucoma, Asthma, Cataracts, Glaucoma, Asthma, N/A Chronic Obstructive Chronic Obstructive Pulmonary Disease (COPD), Pulmonary Disease (COPD), Arrhythmia, Hypertension Arrhythmia, Hypertension Date Acquired: 03/05/2019 03/05/2019 N/A Weeks of Treatment: 0 0 N/A Wound Status: Open Open N/A Measurements L x W x D 1.1x2.2x0.1 0.5x1.1x0.1 N/A (cm) Area (cm) : 1.901 0.432 N/A Volume (cm) : 0.19 0.043 N/A % Reduction in Area: 0.00% 0.00% N/A % Reduction in Volume: 0.00% 0.00% N/A Classification: Full Thickness Without Partial Thickness N/A Exposed Support Structures Exudate Amount: Medium Medium N/A Exudate Type: Serosanguineous Serosanguineous N/A Exudate Color: red, brown red, brown N/A Kudo, Delano H. ([id number]) Wound Margin: Flat and  Intact N/A N/A Granulation Amount: Medium (34-66%) Medium (34-66%) N/A Granulation Quality: Red N/A N/A Necrotic Amount: Medium (34-66%) Small (1-33%) N/A Necrotic Tissue: Eschar Eschar N/A Exposed Structures: Fat Layer (Subcutaneous Fascia: No N/A Tissue) Exposed: Yes Fat Layer (Subcutaneous Fascia: No Tissue) Exposed: No Tendon: No Tendon: No Muscle: No Muscle: No Joint: No Joint: No Bone: No Bone: No Limited to Skin Breakdown Epithelialization: None None N/A Debridement: Debridement - Excisional N/A N/A Pre-procedure 09:57 N/A N/A Verification/Time Out Taken: Pain Control: Other N/A N/A Tissue Debrided: Blood Clots, Subcutaneous N/A N/A Level: Skin/Subcutaneous Tissue N/A N/A Debridement Area (sq cm): 2.42 N/A N/A Instrument: Curette, Forceps, Scissors N/A N/A Bleeding: Minimum N/A N/A Hemostasis Achieved: Pressure N/A N/A Procedural Pain: 0 N/A N/A Post Procedural Pain: 0 N/A N/A Debridement Treatment Procedure was tolerated well N/A N/A Response: Post Debridement 1.1x2.2x0.2 N/A N/A Measurements L x W x D (cm) Post Debridement Volume: 0.38 N/A N/A (cm) Procedures Performed: Debridement N/A N/A Treatment Notes Electronic Signature(s) Signed: 03/10/2019 10:21:42 AM By: Montey Hora Entered By: Montey Hora on 03/10/2019 10:21:42 [name] ([id number]) -------------------------------------------------------------------------------- Multi-Disciplinary Care Plan Details Patient Name: [name] Date of Service: 03/10/2019 9:45 AM Medical Record Number: [id number] Patient Account Number: [id number] Date of Birth/Sex: Oct 10, 1927 (85 y.o. F) Treating RN: Montey Hora Primary Care Kassidie Hendriks: Park Liter Other Clinician: Referring Zaynah Chawla: Referral, Self Treating Cassondra Stachowski/Extender: [name], [name] Weeks in Treatment: 0 Active Inactive Abuse / Safety / Falls / Self Care Management Nursing Diagnoses: Potential for falls Goals: Patient  will not experience any injury related to falls Date Initiated: 03/10/2019 Target Resolution Date: 06/13/2019 Goal Status: Active Interventions: Assess fall risk on admission and as needed Notes: Necrotic Tissue Nursing Diagnoses: Impaired tissue integrity related to necrotic/devitalized tissue Goals: Patient/caregiver will verbalize understanding of reason and process for debridement of necrotic tissue Date Initiated: 03/10/2019 Target Resolution Date: 06/13/2019 Goal Status: Active Interventions: Provide education on necrotic tissue and debridement process Notes: Orientation to the Wound Care Program Nursing Diagnoses: Knowledge deficit related to the wound healing center program Goals: Patient/caregiver will verbalize understanding of the [place] Program Date Initiated: 03/10/2019 Target Resolution Date: 06/13/2019 Goal Status: Active Interventions: Provide education on orientation to the wound center Krupp, Corah H. ([id number]) Notes: Wound/Skin Impairment Nursing Diagnoses: Impaired tissue integrity Goals: Ulcer/skin breakdown will heal within 14 weeks Date Initiated: 03/10/2019 Target Resolution Date: 06/13/2019 Goal Status: Active Interventions: Assess patient/caregiver ability to obtain necessary supplies Assess patient/caregiver ability to perform ulcer/skin care regimen upon admission and as needed Assess ulceration(s) every visit Notes: Electronic Signature(s) Signed: 03/10/2019 10:18:14 AM By: Montey Hora Entered By: Montey Hora  on 03/10/2019 10:18:14 Yutzy, Haroldine H. ([id number]) -------------------------------------------------------------------------------- Pain Assessment Details Patient Name: MARAH, PARK. Date of Service: 03/10/2019 9:45 AM Medical Record Number: [id number] Patient Account Number: [id number] Date of Birth/Sex: September 06, 1927 (85 y.o. F) Treating RN: Harold Barban Primary Care Kerline Trahan: Park Liter Other  Clinician: Referring Zahki Hoogendoorn: Referral, Self Treating Tylia Ewell/Extender: [name], [name] Weeks in Treatment: 0 Active Problems Location of Pain Severity and Description of Pain Patient Has Paino No Site Locations Pain Management and Medication Current Pain Management: Electronic Signature(s) Signed: 03/10/2019 2:42:48 PM By: Harold Barban Entered By: Harold Barban on 03/10/2019 09:27:55 Sweet, [name] ([id number]) -------------------------------------------------------------------------------- Patient/Caregiver Education Details Patient Name: [name] Date of Service: 03/10/2019 9:45 AM Medical Record Number: [id number] Patient Account Number: [id number] Date of Birth/Gender: 28-Sep-1927 (85 y.o. F) Treating RN: Montey Hora Primary Care Physician: Park Liter Other Clinician: Referring Physician: Referral, Self Treating Physician/Extender: Sharalyn Ink in Treatment: 0 Education Assessment Education Provided To: Patient Education Topics Provided Wound/Skin Impairment: Handouts: Other: wound care as ordered Methods: Demonstration, Explain/Verbal Responses: State content correctly Electronic Signature(s) Signed: 03/10/2019 5:36:25 PM By: Montey Hora Entered By: Montey Hora on 03/10/2019 10:22:56 Kinnear, Ashe H. ([id number]) -------------------------------------------------------------------------------- Wound Assessment Details Patient Name: Switzer, Ruthell H. Date of Service: 03/10/2019 9:45 AM Medical Record Number: [id number] Patient Account Number: [id number] Date of Birth/Sex: 09-07-1927 (85 y.o. F) Treating RN: [name] Primary Care Ashonti Leandro: Park Liter Other Clinician: Referring Llewelyn Sheaffer: Referral, Self Treating Becky Colan/Extender: STONE III, [name] Weeks in Treatment: 0 Wound Status Wound Number: 10 Primary Trauma, Other Etiology: Wound Location: Right Lower Leg - Distal Wound Open Wounding Event:  Trauma Status: Date Acquired: 03/05/2019 Comorbid Cataracts, Glaucoma, Asthma, Chronic Weeks Of Treatment: 0 History: Obstructive Pulmonary Disease (COPD), Clustered Wound: No Arrhythmia, Hypertension Photos Wound Measurements Length: (cm) 0.8 % Reduction i Width: (cm) 0.5 % Reduction i Depth: (cm) 0.1 Epithelializa Area: (cm) 0.314 Tunneling: Volume: (cm) 0.031 Undermining: n Area: 0% n Volume: 0% tion: None No No Wound Description Classification: Partial Thickness Foul Odor Aft Exudate Amount: Medium Slough/Fibrin Exudate Type: Serosanguineous Exudate Color: red, brown er Cleansing: No o No Wound Bed Granulation Amount: Large (67-100%) Exposed Structure Granulation Quality: Red Fascia Exposed: No Necrotic Amount: None Present (0%) Fat Layer (Subcutaneous Tissue) Exposed: No Tendon Exposed: No Muscle Exposed: No Joint Exposed: No Bone Exposed: No Limited to Skin Breakdown Electronic Signature(s) FREDRICK, DRAY ([id number]) Signed: 03/10/2019 10:19:18 AM By: Montey Hora Signed: 03/10/2019 10:47:17 AM By: [name] Previous Signature: 03/10/2019 9:47:14 AM Version By: [name] Entered By: Montey Hora on 03/10/2019 10:19:18 Jolin, Achsah H. ([id number]) -------------------------------------------------------------------------------- Wound Assessment Details Patient Name: Bieker, Dacy H. Date of Service: 03/10/2019 9:45 AM Medical Record Number: [id number] Patient Account Number: [id number] Date of Birth/Sex: 27-Oct-1927 (85 y.o. F) Treating RN: [name] Primary Care Henri Guedes: Park Liter Other Clinician: Referring Samon Dishner: Referral, Self Treating Syair Fricker/Extender: STONE III, [name] Weeks in Treatment: 0 Wound Status Wound Number: 11 Primary Trauma, Other Etiology: Wound Location: Right Lower Leg - Lateral Wound Open Wounding Event: Trauma Status: Date Acquired: 03/05/2019 Comorbid Cataracts, Glaucoma, Asthma, Chronic Weeks Of  Treatment: 0 History: Obstructive Pulmonary Disease (COPD), Clustered Wound: No Arrhythmia, Hypertension Photos Wound Measurements Length: (cm) 1.6 % Reduction in Width: (cm) 0.3 % Reduction in Depth: (cm) 0.1 Epithelializat Area: (cm) 0.377 Tunneling: Volume: (cm) 0.038 Undermining: Area: 0% Volume: 0% ion: None No No Wound Description Classification: Partial Thickness Foul Odor Afte Exudate Amount: Medium Slough/Fibrino Exudate Type: Serosanguineous Exudate Color: red, brown r Cleansing: No  No Wound Bed Granulation Amount: Large (67-100%) Exposed Structure Granulation Quality: Red Fascia Exposed: No Necrotic Amount: None Present (0%) Fat Layer (Subcutaneous Tissue) Exposed: No Tendon Exposed: No Muscle Exposed: No Joint Exposed: No Bone Exposed: No Limited to Skin Breakdown Electronic Signature(s) XARIAH, SILVERNAIL ([id number]) Signed: 03/10/2019 10:19:32 AM By: Montey Hora Signed: 03/10/2019 10:47:17 AM By: [name] Previous Signature: 03/10/2019 9:48:19 AM Version By: [name] Entered By: Montey Hora on 03/10/2019 10:19:31 Ruperto, Sheera H. ([id number]) -------------------------------------------------------------------------------- Wound Assessment Details Patient Name: Lanter, Alianna H. Date of Service: 03/10/2019 9:45 AM Medical Record Number: [id number] Patient Account Number: [id number] Date of Birth/Sex: [birth year] (85 y.o. F) Treating RN: [name] Primary Care Nicholus Chandran: Park Liter Other Clinician: Referring Velisa Regnier: Referral, Self Treating Damare Serano/Extender: STONE III, [name] Weeks in Treatment: 0 Wound Status Wound Number: 7 Primary Trauma, Other Etiology: Wound Location: Right Lower Leg - Medial Wound Open Wounding Event: Trauma Status: Date Acquired: 03/05/2019 Comorbid Cataracts, Glaucoma, Asthma, Chronic Weeks Of Treatment: 0 History: Obstructive Pulmonary Disease (COPD), Clustered Wound: No Arrhythmia,  Hypertension Photos Wound Measurements Length: (cm) 0.4 Width: (cm) 1.5 Depth: (cm) 0.1 Area: (cm) 0.471 Volume: (cm) 0.047 % Reduction in Area: 0% % Reduction in Volume: 0% Epithelialization: None Tunneling: No Undermining: No Wound Description Full Thickness Without Exposed Support Foul Odo Classification: Structures Slough/F Wound Margin: Flat and Intact Exudate Medium Amount: Exudate Type: Serosanguineous Exudate Color: red, brown r After Cleansing: No ibrino Yes Wound Bed Granulation Amount: Medium (34-66%) Exposed Structure Granulation Quality: Red Fascia Exposed: No Necrotic Amount: Medium (34-66%) Fat Layer (Subcutaneous Tissue) Exposed: Yes Necrotic Quality: Eschar Tendon Exposed: No Muscle Exposed: No Joint Exposed: No Bone Exposed: No Bala, Velena H. ([id number]) Electronic Signature(s) Signed: 03/10/2019 10:47:17 AM By: [name] Signed: 03/10/2019 5:36:25 PM By: Montey Hora Previous Signature: 03/10/2019 9:43:14 AM Version By: [name] Entered By: Montey Hora on 03/10/2019 10:04:34 Lafontaine, Avril H. ([id number]) -------------------------------------------------------------------------------- Wound Assessment Details Patient Name: Walther, Deshia H. Date of Service: 03/10/2019 9:45 AM Medical Record Number: [id number] Patient Account Number: [id number] Date of Birth/Sex: 02-Jun-1928 (85 y.o. F) Treating RN: [name] Primary Care Jeanclaude Wentworth: Park Liter Other Clinician: Referring Jeidy Hoerner: Referral, Self Treating Auden Tatar/Extender: STONE III, [name] Weeks in Treatment: 0 Wound Status Wound Number: 8 Primary Trauma, Other Etiology: Wound Location: Right Lower Leg - Anterior Wound Open Wounding Event: Trauma Status: Date Acquired: 03/05/2019 Comorbid Cataracts, Glaucoma, Asthma, Chronic Weeks Of Treatment: 0 History: Obstructive Pulmonary Disease (COPD), Clustered Wound: No Arrhythmia, Hypertension Photos Wound  Measurements Length: (cm) 1.1 Width: (cm) 2.2 Depth: (cm) 0.1 Area: (cm) 1.901 Volume: (cm) 0.19 % Reduction in Area: 0% % Reduction in Volume: 0% Epithelialization: None Tunneling: No Undermining: No Wound Description Full Thickness Without Exposed Support Foul Odo Classification: Structures Slough/F Wound Margin: Flat and Intact Exudate Medium Amount: Exudate Type: Serosanguineous Exudate Color: red, brown r After Cleansing: No ibrino Yes Wound Bed Granulation Amount: Medium (34-66%) Exposed Structure Granulation Quality: Red Fascia Exposed: No Necrotic Amount: Medium (34-66%) Fat Layer (Subcutaneous Tissue) Exposed: Yes Necrotic Quality: Eschar Tendon Exposed: No Muscle Exposed: No Joint Exposed: No Bone Exposed: No Schoppe, Rudie H. ([id number]) Electronic Signature(s) Signed: 03/10/2019 10:47:17 AM By: [name] Signed: 03/10/2019 5:36:25 PM By: Montey Hora Previous Signature: 03/10/2019 9:44:39 AM Version By: [name] Entered By: Montey Hora on 03/10/2019 10:04:56 Trella, Mimie H. ([id number]) -------------------------------------------------------------------------------- Wound Assessment Details Patient Name: Busick, Zain H. Date of Service: 03/10/2019 9:45 AM Medical Record Number: [id number] Patient Account Number: [id number] Date of Birth/Sex: 1928/05/26 (85  y.o. F) Treating RN: [name] Primary Care Milianna Ericsson: Park Liter Other Clinician: Referring Ki Luckman: Referral, Self Treating Irl Bodie/Extender: STONE III, [name] Weeks in Treatment: 0 Wound Status Wound Number: 9 Primary Trauma, Other Etiology: Wound Location: Right Lower Leg - Proximal Wound Open Wounding Event: Trauma Status: Date Acquired: 03/05/2019 Comorbid Cataracts, Glaucoma, Asthma, Chronic Weeks Of Treatment: 0 History: Obstructive Pulmonary Disease (COPD), Clustered Wound: No Arrhythmia, Hypertension Photos Wound Measurements Length: (cm) 0.5 % Reduction  in Width: (cm) 1.1 % Reduction in Depth: (cm) 0.1 Epithelializati Area: (cm) 0.432 Tunneling: Volume: (cm) 0.043 Undermining: Area: 0% Volume: 0% on: None No No Wound Description Classification: Partial Thickness Foul Odor After Exudate Amount: Medium Slough/Fibrino Exudate Type: Serosanguineous Exudate Color: red, brown Cleansing: No Yes Wound Bed Granulation Amount: Medium (34-66%) Exposed Structure Necrotic Amount: Small (1-33%) Fascia Exposed: No Necrotic Quality: Eschar Fat Layer (Subcutaneous Tissue) Exposed: No Tendon Exposed: No Muscle Exposed: No Joint Exposed: No Bone Exposed: No Limited to Skin Breakdown Electronic Signature(s) LURA, FALOR. ([id number]) Signed: 03/10/2019 10:19:50 AM By: Montey Hora Signed: 03/10/2019 10:47:17 AM By: [name] Previous Signature: 03/10/2019 9:45:59 AM Version By: [name] Entered By: Montey Hora on 03/10/2019 10:19:50 Brubacher, Loucinda H. ([id number]) -------------------------------------------------------------------------------- Vitals Details Patient Name: Spainhour, Rochanda H. Date of Service: 03/10/2019 9:45 AM Medical Record Number: [id number] Patient Account Number: [id number] Date of Birth/Sex: Jan 15, 1928 (85 y.o. F) Treating RN: Harold Barban Primary Care Renate Danh: Park Liter Other Clinician: Referring Janet Decesare: Referral, Self Treating Andren Bethea/Extender: STONE III, [name] Weeks in Treatment: 0 Vital Signs Time Taken: 09:29 Temperature (F): 98.0 Height (in): 60 Pulse (bpm): 68 Source: Stated Respiratory Rate (breaths/min): 16 Weight (lbs): 92 Blood Pressure (mmHg): 153/48 Source: Stated Reference Range: 80 - 120 mg / dl Body Mass Index (BMI): 18 Electronic Signature(s) Signed: 03/10/2019 2:42:48 PM By: Harold Barban Entered By: Harold Barban on 03/10/2019 09:29:54

## 2019-03-17 ENCOUNTER — Encounter: Payer: Medicare Other | Admitting: Internal Medicine

## 2019-03-17 ENCOUNTER — Other Ambulatory Visit: Payer: Self-pay

## 2019-03-17 DIAGNOSIS — S81801A Unspecified open wound, right lower leg, initial encounter: Secondary | ICD-10-CM | POA: Diagnosis not present

## 2019-03-17 NOTE — Progress Notes (Addendum)
[name], [name] ([id number]) Visit Report for 03/17/2019 HPI Details Patient Name: [name], KILCREASE. Date of Service: 03/17/2019 2:00 PM Medical Record Number: [id number] Patient Account Number: [id number] Date of Birth/Sex: Aug 12, 1928 (85 y.o. F) Treating RN: Montey Hora Primary Care Provider: Park Liter Other Clinician: Referring Provider: Park Liter Treating Provider/Extender: Beverly Gust in Treatment: 1 History of Present Illness HPI Description: 02/12/18 ADMISSION This is an 85 year old woman who is recently moved to [place] from [place]. Her story began in February where she fell on some ice suffering extensive lacerations to her bilateral lower extremities. She is able to show me extensive pictures of the right lower leg but with going to a wound care center and [place] 3 times a week these eventually closed over. She has been left with 1 area on the upper lateral left calf. She has been applying Neosporin to this and applying a bandage. Currently this measures 2 x 1.5 cm. The patient is not a diabetic. She is an ex-smoker quitting 40 years ago. She does have COPD by description. ABIs in our clinic were 1.32 on the right and 1.03 on the left 02/19/18; right lower leg wound which was initially trauma. The area that we look that last week is smaller. Still covered in a nonviable surface however. We are using Iodoflex 02/26/18; right lower leg wound which was initially trauma in the setting of chronic venous insufficiency. Surface of the wound looks much better healthy granulation advancing epithelialization. We have good edema control 03/05/18; right lower leg wound which was initially trauma in the setting of chronic venous insufficiency. She continues to make nice progress here. We have good edema control oHe arrives today with a new traumatic laceration on her right dorsal forearm which she states happened while she was putting on a sweatshirt 03/12/18;  right lower leg wound as closed however it still looks vulnerable. She has chronic venous insufficiency. oThe new wound from last week a traumatic area on her right dorsal or arm unfortunately does not have a viable surface. I remove some nonviable skin and necrotic subcutaneous debris from the wound surface. Hemostasis with silver nitrate and direct pressure 03/19/18; right lower leg wound is closed. She has chronic venous insufficiency and will need ongoing compression oThe new wounds from 2 weeks ago on the right upper elbow is closed. The area on her right dorsal forearm continues to have a nonviable surface requiring debridement 03/26/18; we'll need to look into ongoing compression for her legs. 2 wounds on the right upper elbow is closed. The area on the right dorsal forearm looks better. Hydrofera Blue secondary to hypertrophic granulation 04/02/18; he has compression stockings although she did not wear them today. Severe chronic venous insufficiency. She has 2 wounds on the right upper elbow which I said were closed last week which actually are although they're very small and she has a smaller clean wound on the right dorsal forearm. We've been using Hydrofera Blue secondary to some upper granulation. All of this looks better 04/09/18-She is seen in follow up evaluation for a right elbow and right forearm skin tear. The right elbow is healed. We will continue with same treatment plan and she'll follow next week 04/16/18-She is seen in follow-up evaluation for right forearm skin tear, this is essentially healed. We will cover with foam border and she will follow-up next week 04/23/18; the patient arrives with a new skin tear on her right dorsal arm just below the elbow. She got this while carrying  a box. She has a linear skin tear. There is no depth I don't think this should've been sutured. There is a skin flap medially I'm not sure if this will be viable or not 04/30/18; the new skin tear from  last week unfortunately has remained viable in terms of the skin flap. She has a small open area that looks healthy. Using moistened silver collagen 05/07/18; right dorsal arm skin tear. Nonviable tissue over the remnants of the wound. We've been using silver collagen 05/21/18; right dorsal arm skin tear. Nonviable tissue over a small remnant of the wound was washed off. We've been using silver collagen which we will continue [name], [name]. ([id number]) 06/04/18; the right dorsal arm skin tear has healed. She arrives today with a traumatic wound just above the olecranon of her left elbow. This is a skin tear with a nonviable nonadherent flap which was removed. We'll use silver collagen here. Also noteworthy she arrives without her compression stockings 06/18/18; the area just above the olecranon of her left elbow. It is smaller and generally has a healthy surface. I was surprised to learn that the patient is actually changing this herself although. Appears this week she was putting some topical lidocaine on this that she bought over-the-counter at the drugstore. She did get supplies at home she didn't know how to put it on. She drove herself here today i.e. not eligible for home health Readmission: 11/04/18 on evaluation today patient presents for evaluation our clinic news to include the she has on her lower extremity which occurred as the result of having hit this on a piece of furniture. This was back in December. She states that her daughter has been trying to get her to come into have this evaluated here the wound care center but she was being somewhat stubborn. Nonetheless upon evaluation today the wound really appears to be potentially healed she does have some swelling of lower extremity which I think will come into play. With that being said he tells me that even yesterday this was still making clear fluid and therefore there may be some issues with continued problems with the swelling and  drainage secondary to her venous stasis. Nonetheless overall wound appears to be doing fairly well which is good news. 11/11/18 on evaluation today patient actually appears to be doing excellent in the area of question that was green just the day before he saw her last time actually appears to have not drain that always in past week. I think this is done very well and at this point though she's had some discomfort I do not see any evidence of open wound which he needs to continue see wound care. Readmission: 03/10/19 on evaluation today patient presents for reevaluation here in our clinic concerning issues that she actually is having with the new problem on her right lower leg due to trauma that occurred when a car door shut on her leg last Thursday. This is not been quite a week and she does have several skin tears unfortunately that resulted from this injury. Fortunately there's no signs of active infection at this time. No fevers, chills, nausea, or vomiting noted at this time. She does have some hot tissue noted at this point that has me concerned about the possibility of needing to move this in order to allow the areas to heal appropriately. Fortunately there's no signs of active infection at this time. 03/17/19-Patient returns to clinic at 1 week, we have been using Prisma on the  larger wounds with Kerlix and Xeroform. As many as 5 wounds were number last time 3 of which have closed, leaving the right proximal tibial and right distal medial leg wound both of which are looking good Electronic Signature(s) Signed: 03/17/2019 2:23:41 PM By: Tobi Bastos Entered By: Tobi Bastos on 03/17/2019 14:23:41 [name], [name] ([id number]) -------------------------------------------------------------------------------- Physical Exam Details Patient Name: [name], [name] Date of Service: 03/17/2019 2:00 PM Medical Record Number: [id number] Patient Account Number: [id number] Date of Birth/Sex:  26-Apr-1928 (85 y.o. F) Treating RN: Montey Hora Primary Care Provider: Park Liter Other Clinician: Referring Provider: Park Liter Treating Provider/Extender: Beverly Gust in Treatment: 1 Constitutional alert and oriented x 3. sitting or standing blood pressure is within target range for patient.. supine blood pressure is within target range for patient.. pulse regular and within target range for patient.Marland Kitchen respirations regular, non-labored and within target range for patient.Marland Kitchen temperature within target range for patient.. . . Well-nourished and well-hydrated in no acute distress. Notes The most proximal areas have healed, the larger proximal right anterior leg wound has a clean base and appears to be doing well, no infection noted no surrounding inflammation noted, right medial lower wound is small and also appears to be healing Electronic Signature(s) Signed: 03/17/2019 2:24:25 PM By: Tobi Bastos Entered By: Tobi Bastos on 03/17/2019 14:24:25 [name], [name] ([id number]) -------------------------------------------------------------------------------- Physician Orders Details Patient Name: Carol Berger H. Date of Service: 03/17/2019 2:00 PM Medical Record Number: [id number] Patient Account Number: [id number] Date of Birth/Sex: 1927-09-10 (85 y.o. F) Treating RN: Montey Hora Primary Care Provider: Park Liter Other Clinician: Referring Provider: Park Liter Treating Provider/Extender: Beverly Gust in Treatment: 1 Verbal / Phone Orders: No Diagnosis Coding Wound Cleansing Wound #7 Right,Medial Lower Leg o Clean wound with Normal Saline. o Dial antibacterial soap, wash wounds, rinse and pat dry prior to dressing wounds o May Shower, gently pat wound dry prior to applying new dressing. Wound #8 Right,Anterior Lower Leg o Clean wound with Normal Saline. o Dial antibacterial soap, wash wounds, rinse and pat dry prior to  dressing wounds o May Shower, gently pat wound dry prior to applying new dressing. Anesthetic (add to Medication List) Wound #7 Right,Medial Lower Leg o Topical Lidocaine 4% cream applied to wound bed prior to debridement (In Clinic Only). Wound #8 Right,Anterior Lower Leg o Topical Lidocaine 4% cream applied to wound bed prior to debridement (In Clinic Only). Primary Wound Dressing Wound #7 Right,Medial Lower Leg o Silver Collagen - to wound bed o Xeroform - over silver collagen Wound #8 Right,Anterior Lower Leg o Silver Collagen - to wound bed o Xeroform - over silver collagen Secondary Dressing Wound #7 Right,Medial Lower Leg o ABD and Kerlix/Conform - secure with netting Wound #8 Right,Anterior Lower Leg o ABD and Kerlix/Conform - secure with netting Dressing Change Frequency Wound #7 Right,Medial Lower Leg o Change Dressing Monday, Wednesday, Friday Wound #8 Right,Anterior Lower Leg o Change Dressing Monday, Wednesday, Friday Follow-up Appointments o Return Appointment in 1 week. [name], [name] ([id number]) Home Health Wound #7 [place] Visits o Home Health Nurse may visit PRN to address patientos wound care needs. o FACE TO FACE ENCOUNTER: MEDICARE and MEDICAID PATIENTS: I certify that this patient is under my care and that I had a face-to-face encounter that meets the physician face-to-face encounter requirements with this patient on this date. The encounter with the patient was in whole or in part for the following MEDICAL CONDITION: (primary reason for  Home Healthcare) MEDICAL NECESSITY: I certify, that based on my findings, NURSING services are a medically necessary home health service. HOME BOUND STATUS: I certify that my clinical findings support that this patient is homebound (i.e., Due to illness or injury, pt requires aid of supportive devices such as crutches, cane, wheelchairs, walkers, the  use of special transportation or the assistance of another person to leave their place of residence. There is a normal inability to leave the home and doing so requires considerable and taxing effort. Other absences are for medical reasons / religious services and are infrequent or of short duration when for other reasons). o If current dressing causes regression in wound condition, may D/C ordered dressing product/s and apply Normal Saline Moist Dressing daily until next [place] / Other MD appointment. [place] of regression in wound condition at [phone]. o Please direct any NON-WOUND related issues/requests for orders to patient's Primary Care Physician Wound #8 [place] Nurse may visit PRN to address patientos wound care needs. o FACE TO FACE ENCOUNTER: MEDICARE and MEDICAID PATIENTS: I certify that this patient is under my care and that I had a face-to-face encounter that meets the physician face-to-face encounter requirements with this patient on this date. The encounter with the patient was in whole or in part for the following MEDICAL CONDITION: (primary reason for [place]) MEDICAL NECESSITY: I certify, that based on my findings, NURSING services are a medically necessary home health service. HOME BOUND STATUS: I certify that my clinical findings support that this patient is homebound (i.e., Due to illness or injury, pt requires aid of supportive devices such as crutches, cane, wheelchairs, walkers, the use of special transportation or the assistance of another person to leave their place of residence. There is a normal inability to leave the home and doing so requires considerable and taxing effort. Other absences are for medical reasons / religious services and are infrequent or of short duration when for other reasons). o If current dressing causes regression in wound  condition, may D/C ordered dressing product/s and apply Normal Saline Moist Dressing daily until next [place] / Other MD appointment. [place] of regression in wound condition at [phone]. o Please direct any NON-WOUND related issues/requests for orders to patient's Primary Care Physician Electronic Signature(s) Signed: 03/17/2019 4:11:29 PM By: Tobi Bastos Signed: 03/17/2019 4:43:58 PM By: Montey Hora Entered By: Montey Hora on 03/17/2019 14:24:26 [name], [name] ([id number]) -------------------------------------------------------------------------------- Progress Note Details Patient Name: [name], [name] Date of Service: 03/17/2019 2:00 PM Medical Record Number: [id number] Patient Account Number: [id number] Date of Birth/Sex: April 21, 1928 (85 y.o. F) Treating RN: Montey Hora Primary Care Provider: Park Liter Other Clinician: Referring Provider: Park Liter Treating Provider/Extender: Beverly Gust in Treatment: 1 Subjective History of Present Illness (HPI) 02/12/18 ADMISSION This is an 85 year old woman who is recently moved to [place] from [place]. Her story began in February where she fell on some ice suffering extensive lacerations to her bilateral lower extremities. She is able to show me extensive pictures of the right lower leg but with going to a wound care center and [place] 3 times a week these eventually closed over. She has been left with 1 area on the upper lateral left calf. She has been applying Neosporin to this and applying a bandage. Currently this measures 2 x 1.5 cm. The patient is not a diabetic. She is an ex-smoker quitting  40 years ago. She does have COPD by description. ABIs in our clinic were 1.32 on the right and 1.03 on the left 02/19/18; right lower leg wound which was initially trauma. The area that we look that last week is smaller. Still covered in a nonviable surface however. We are  using Iodoflex 02/26/18; right lower leg wound which was initially trauma in the setting of chronic venous insufficiency. Surface of the wound looks much better healthy granulation advancing epithelialization. We have good edema control 03/05/18; right lower leg wound which was initially trauma in the setting of chronic venous insufficiency. She continues to make nice progress here. We have good edema control He arrives today with a new traumatic laceration on her right dorsal forearm which she states happened while she was putting on a sweatshirt 03/12/18; right lower leg wound as closed however it still looks vulnerable. She has chronic venous insufficiency. The new wound from last week a traumatic area on her right dorsal or arm unfortunately does not have a viable surface. I remove some nonviable skin and necrotic subcutaneous debris from the wound surface. Hemostasis with silver nitrate and direct pressure 03/19/18; right lower leg wound is closed. She has chronic venous insufficiency and will need ongoing compression The new wounds from 2 weeks ago on the right upper elbow is closed. The area on her right dorsal forearm continues to have a nonviable surface requiring debridement 03/26/18; we'll need to look into ongoing compression for her legs. 2 wounds on the right upper elbow is closed. The area on the right dorsal forearm looks better. Hydrofera Blue secondary to hypertrophic granulation 04/02/18; he has compression stockings although she did not wear them today. Severe chronic venous insufficiency. She has 2 wounds on the right upper elbow which I said were closed last week which actually are although they're very small and she has a smaller clean wound on the right dorsal forearm. We've been using Hydrofera Blue secondary to some upper granulation. All of this looks better 04/09/18-She is seen in follow up evaluation for a right elbow and right forearm skin tear. The right elbow is healed. We  will continue with same treatment plan and she'll follow next week 04/16/18-She is seen in follow-up evaluation for right forearm skin tear, this is essentially healed. We will cover with foam border and she will follow-up next week 04/23/18; the patient arrives with a new skin tear on her right dorsal arm just below the elbow. She got this while carrying a box. She has a linear skin tear. There is no depth I don't think this should've been sutured. There is a skin flap medially I'm not sure if this will be viable or not 04/30/18; the new skin tear from last week unfortunately has remained viable in terms of the skin flap. She has a small open area that looks healthy. Using moistened silver collagen 05/07/18; right dorsal arm skin tear. Nonviable tissue over the remnants of the wound. We've been using silver collagen 05/21/18; right dorsal arm skin tear. Nonviable tissue over a small remnant of the wound was washed off. We've been using silver collagen which we will continue 06/04/18; the right dorsal arm skin tear has healed. She arrives today with a traumatic wound just above the olecranon of her left elbow. This is a skin tear with a nonviable nonadherent flap which was removed. We'll use silver collagen here. Also noteworthy she arrives without her compression stockings [name], [name] ([id number]) 06/18/18; the area just above the  olecranon of her left elbow. It is smaller and generally has a healthy surface. I was surprised to learn that the patient is actually changing this herself although. Appears this week she was putting some topical lidocaine on this that she bought over-the-counter at the drugstore. She did get supplies at home she didn't know how to put it on. She drove herself here today i.e. not eligible for home health Readmission: 11/04/18 on evaluation today patient presents for evaluation our clinic news to include the she has on her lower extremity which occurred as the result  of having hit this on a piece of furniture. This was back in December. She states that her daughter has been trying to get her to come into have this evaluated here the wound care center but she was being somewhat stubborn. Nonetheless upon evaluation today the wound really appears to be potentially healed she does have some swelling of lower extremity which I think will come into play. With that being said he tells me that even yesterday this was still making clear fluid and therefore there may be some issues with continued problems with the swelling and drainage secondary to her venous stasis. Nonetheless overall wound appears to be doing fairly well which is good news. 11/11/18 on evaluation today patient actually appears to be doing excellent in the area of question that was green just the day before he saw her last time actually appears to have not drain that always in past week. I think this is done very well and at this point though she's had some discomfort I do not see any evidence of open wound which he needs to continue see wound care. Readmission: 03/10/19 on evaluation today patient presents for reevaluation here in our clinic concerning issues that she actually is having with the new problem on her right lower leg due to trauma that occurred when a car door shut on her leg last Thursday. This is not been quite a week and she does have several skin tears unfortunately that resulted from this injury. Fortunately there's no signs of active infection at this time. No fevers, chills, nausea, or vomiting noted at this time. She does have some hot tissue noted at this point that has me concerned about the possibility of needing to move this in order to allow the areas to heal appropriately. Fortunately there's no signs of active infection at this time. 03/17/19-Patient returns to clinic at 1 week, we have been using Prisma on the larger wounds with Kerlix and Xeroform. As many as 5 wounds were  number last time 3 of which have closed, leaving the right proximal tibial and right distal medial leg wound both of which are looking good Objective Constitutional alert and oriented x 3. sitting or standing blood pressure is within target range for patient.. supine blood pressure is within target range for patient.. pulse regular and within target range for patient.Marland Kitchen respirations regular, non-labored and within target range for patient.Marland Kitchen temperature within target range for patient.. Well-nourished and well-hydrated in no acute distress. Vitals Time Taken: 2:02 PM, Height: 60 in, Weight: 92 lbs, BMI: 18, Temperature: 98.4 F, Pulse: 92 bpm, Respiratory Rate: 16 breaths/min, Blood Pressure: 132/60 mmHg. General Notes: The most proximal areas have healed, the larger proximal right anterior leg wound has a clean base and appears to be doing well, no infection noted no surrounding inflammation noted, right medial lower wound is small and also appears to be healing Integumentary (Hair, Skin) Wound #10 status is  Healed - Epithelialized. Original cause of wound was Trauma. The wound is located on the Right,[place]al [place], Tenesia H. ([id number]) Lower Leg. The wound measures 0cm length x 0cm width x 0cm depth; 0cm^2 area and 0cm^3 volume. The wound is limited to skin breakdown. There is no tunneling or undermining noted. There is a none present amount of drainage noted. There is no granulation within the wound bed. There is a large (67-100%) amount of necrotic tissue within the wound bed including Eschar. Wound #11 status is Healed - Epithelialized. Original cause of wound was Trauma. The wound is located on the Right,Lateral Lower Leg. The wound measures 0cm length x 0cm width x 0cm depth; 0cm^2 area and 0cm^3 volume. The wound is limited to skin breakdown. There is no tunneling or undermining noted. There is a none present amount of drainage noted. There is no granulation within the wound bed.  There is no necrotic tissue within the wound bed. Wound #7 status is Open. Original cause of wound was Trauma. The wound is located on the Right,Medial Lower Leg. The wound measures 0.3cm length x 1.5cm width x 0.1cm depth; 0.353cm^2 area and 0.035cm^3 volume. There is Fat Layer (Subcutaneous Tissue) Exposed exposed. There is no tunneling or undermining noted. There is a medium amount of serosanguineous drainage noted. The wound margin is flat and intact. There is medium (34-66%) red granulation within the wound bed. There is a medium (34-66%) amount of necrotic tissue within the wound bed including Eschar. Wound #8 status is Open. Original cause of wound was Trauma. The wound is located on the Right,Anterior Lower Leg. The wound measures 2cm length x 2.2cm width x 0.1cm depth; 3.456cm^2 area and 0.346cm^3 volume. There is Fat Layer (Subcutaneous Tissue) Exposed exposed. There is no tunneling or undermining noted. There is a medium amount of serosanguineous drainage noted. The wound margin is flat and intact. There is medium (34-66%) red granulation within the wound bed. There is a medium (34-66%) amount of necrotic tissue within the wound bed including Eschar. Wound #9 status is Healed - Epithelialized. Original cause of wound was Trauma. The wound is located on the Right,Proximal Lower Leg. The wound measures 0cm length x 0cm width x 0cm depth; 0cm^2 area and 0cm^3 volume. The wound is limited to skin breakdown. There is no tunneling or undermining noted. There is a none present amount of drainage noted. There is no granulation within the wound bed. There is a small (1-33%) amount of necrotic tissue within the wound bed including Eschar. Plan Wound Cleansing: Wound #7 Right,Medial Lower Leg: Clean wound with Normal Saline. Dial antibacterial soap, wash wounds, rinse and pat dry prior to dressing wounds May Shower, gently pat wound dry prior to applying new dressing. Wound #8 Right,Anterior  Lower Leg: Clean wound with Normal Saline. Dial antibacterial soap, wash wounds, rinse and pat dry prior to dressing wounds May Shower, gently pat wound dry prior to applying new dressing. Anesthetic (add to Medication List): Wound #7 Right,Medial Lower Leg: Topical Lidocaine 4% cream applied to wound bed prior to debridement (In Clinic Only). Wound #8 Right,Anterior Lower Leg: Topical Lidocaine 4% cream applied to wound bed prior to debridement (In Clinic Only). Primary Wound Dressing: Wound #7 Right,Medial Lower Leg: Silver Collagen - to wound bed Xeroform - over silver collagen Wound #8 Right,Anterior Lower Leg: Silver Collagen - to wound bed Xeroform - over silver collagen Secondary Dressing: Wound #7 Right,Medial Lower Leg: ABD and Kerlix/Conform - secure with netting Wound #8 Right,Anterior Lower Leg:  FRANCES, AMBROSINO ([id number]) ABD and Kerlix/Conform - secure with netting Dressing Change Frequency: Wound #7 Right,Medial Lower Leg: Change Dressing Monday, Wednesday, Friday Wound #8 Right,Anterior Lower Leg: Change Dressing Monday, Wednesday, Friday Follow-up Appointments: Return Appointment in 1 week. Home Health: Wound #7 Right,Medial Lower Leg: [place] Nurse may visit PRN to address patient s wound care needs. FACE TO FACE ENCOUNTER: MEDICARE and MEDICAID PATIENTS: I certify that this patient is under my care and that I had a face-to-face encounter that meets the physician face-to-face encounter requirements with this patient on this date. The encounter with the patient was in whole or in part for the following MEDICAL CONDITION: (primary reason for [place]) MEDICAL NECESSITY: I certify, that based on my findings, NURSING services are a medically necessary home health service. HOME BOUND STATUS: I certify that my clinical findings support that this patient is homebound (i.e., Due to illness or injury, pt requires aid of  supportive devices such as crutches, cane, wheelchairs, walkers, the use of special transportation or the assistance of another person to leave their place of residence. There is a normal inability to leave the home and doing so requires considerable and taxing effort. Other absences are for medical reasons / religious services and are infrequent or of short duration when for other reasons). If current dressing causes regression in wound condition, may D/C ordered dressing product/s and apply Normal Saline Moist Dressing daily until next [place] / Other MD appointment. [place] of regression in wound condition at [phone]. Please direct any NON-WOUND related issues/requests for orders to patient's Primary Care Physician Wound #8 Right,Anterior Lower Leg: [place] Nurse may visit PRN to address patient s wound care needs. FACE TO FACE ENCOUNTER: MEDICARE and MEDICAID PATIENTS: I certify that this patient is under my care and that I had a face-to-face encounter that meets the physician face-to-face encounter requirements with this patient on this date. The encounter with the patient was in whole or in part for the following MEDICAL CONDITION: (primary reason for [place]) MEDICAL NECESSITY: I certify, that based on my findings, NURSING services are a medically necessary home health service. HOME BOUND STATUS: I certify that my clinical findings support that this patient is homebound (i.e., Due to illness or injury, pt requires aid of supportive devices such as crutches, cane, wheelchairs, walkers, the use of special transportation or the assistance of another person to leave their place of residence. There is a normal inability to leave the home and doing so requires considerable and taxing effort. Other absences are for medical reasons / religious services and are infrequent or of short duration when for other reasons). If  current dressing causes regression in wound condition, may D/C ordered dressing product/s and apply Normal Saline Moist Dressing daily until next [place] / Other MD appointment. [place] of regression in wound condition at [phone]. Please direct any NON-WOUND related issues/requests for orders to patient's Primary Care Physician 1. Continue using silver collagen to wound bed for the larger wounds numbering 2, continue using Kerlix conform surrounding this 2. Return to clinic next week Overall progress is really good Electronic Signature(s) Signed: 03/17/2019 2:25:15 PM By: Tobi Bastos Entered By: Tobi Bastos on 03/17/2019 14:25:15 Pozzi, Gracy H. ([id number]) -------------------------------------------------------------------------------- SuperBill Details Patient Name: Ulrey, Lilana H. Date of Service: 03/17/2019 Medical Record Number: [id number] Patient Account Number: [id number] Date of Birth/Sex: 1928/04/18 (85 y.o. F) Treating  RN: Montey Hora Primary Care Provider: Park Liter Other Clinician: Referring Provider: Park Liter Treating Provider/Extender: Beverly Gust in Treatment: 1 Diagnosis Coding ICD-10 Codes Code Description I87.2 Venous insufficiency (chronic) (peripheral) L97.812 Non-pressure chronic ulcer of other part of right lower leg with fat layer exposed Physician Procedures CPT4 Code: [id number] Description: 18403 - WC PHYS LEVEL 3 - EST PT ICD-10 Diagnosis Description I87.2 Venous insufficiency (chronic) (peripheral) Modifier: Quantity: 1 Electronic Signature(s) Signed: 03/17/2019 2:25:29 PM By: Tobi Bastos Entered By: Tobi Bastos on 03/17/2019 14:25:28

## 2019-03-18 NOTE — Progress Notes (Signed)
[name], [name] ([id number]) Visit Report for 03/17/2019 Arrival Information Details Patient Name: [name]. Date of Service: 03/17/2019 2:00 PM Medical Record Number: [id number] Patient Account Number: [id number] Date of Birth/Sex: March 29, 1928 (85 y.o. F) Treating RN: [name] Primary Care Reyna Lorenzi: Park Liter Other Clinician: Referring Raymona Boss: Park Liter Treating Jabaree Mercado/Extender: Beverly Gust in Treatment: 1 Visit Information History Since Last Visit Added or deleted any medications: No Patient Arrived: Ambulatory Any new allergies or adverse reactions: No Arrival Time: 14:01 Had a fall or experienced change in No Accompanied By: self activities of daily living that may affect Transfer Assistance: None risk of falls: Signs or symptoms of abuse/neglect since last visito No Hospitalized since last visit: No Has Dressing in Place as Prescribed: Yes Pain Present Now: No Electronic Signature(s) Signed: 03/17/2019 3:04:02 PM By: [name] Entered By: [name] on 03/17/2019 14:02:00 Wohler, Codee H. ([id number]) -------------------------------------------------------------------------------- Lower Extremity Assessment Details Patient Name: Ebron, Katelin H. Date of Service: 03/17/2019 2:00 PM Medical Record Number: [id number] Patient Account Number: [id number] Date of Birth/Sex: 10/08/1927 (85 y.o. F) Treating RN: [name] Primary Care Briyana Badman: Park Liter Other Clinician: Referring Jacky Hartung: Park Liter Treating Harnoor Kohles/Extender: Beverly Gust in Treatment: 1 Edema Assessment Assessed: [Left: No] [Right: No] Edema: [Left: N] [Right: o] Vascular Assessment Pulses: Dorsalis Pedis Palpable: [Right:Yes] Electronic Signature(s) Signed: 03/17/2019 3:04:02 PM By: [name] Entered By: [name] on 03/17/2019 14:14:43 Ferrante, [place].  ([id number]) -------------------------------------------------------------------------------- Multi Wound Chart Details Patient Name: Matsuura, Nohemy H. Date of Service: 03/17/2019 2:00 PM Medical Record Number: [id number] Patient Account Number: [id number] Date of Birth/Sex: 09-21-1927 (85 y.o. F) Treating RN: Montey Hora Primary Care Hayde Kilgour: Park Liter Other Clinician: Referring Amrutha Avera: Park Liter Treating Kalob Bergen/Extender: Beverly Gust in Treatment: 1 Vital Signs Height(in): 60 Pulse(bpm): 44 Weight(lbs): 64 Blood Pressure(mmHg): 132/60 Body Mass Index(BMI): 18 Temperature(F): 98.4 Respiratory Rate 16 (breaths/min): Photos: Wound Location: Right, Distal Lower Leg Right, Lateral Lower Leg Right Lower Leg - Medial Wounding Event: Trauma Trauma Trauma Primary Etiology: Trauma, Other Trauma, Other Trauma, Other Comorbid History: Cataracts, Glaucoma, Asthma, Cataracts, Glaucoma, Asthma, Cataracts, Glaucoma, Asthma, Chronic Obstructive Chronic Obstructive Chronic Obstructive Pulmonary Disease (COPD), Pulmonary Disease (COPD), Pulmonary Disease (COPD), Arrhythmia, Hypertension Arrhythmia, Hypertension Arrhythmia, Hypertension Date Acquired: 03/05/2019 03/05/2019 03/05/2019 Weeks of Treatment: 1 1 1  Wound Status: Healed - Epithelialized Healed - Epithelialized Open Measurements L x W x D 0x0x0 0x0x0 0.3x1.5x0.1 (cm) Area (cm) : 0 0 0.353 Volume (cm) : 0 0 0.035 % Reduction in Area: 100.00% 100.00% 25.10% % Reduction in Volume: 100.00% 100.00% 25.50% Classification: Partial Thickness Partial Thickness Full Thickness Without Exposed Support Structures Exudate Amount: None Present None Present Medium Exudate Type: N/A N/A Serosanguineous Exudate Color: N/A N/A red, brown Wound Margin: N/A N/A Flat and Intact Granulation Amount: None Present (0%) None Present (0%) Medium (34-66%) Granulation Quality: N/A N/A Red Necrotic Amount: Large (67-100%) None  Present (0%) Medium (34-66%) Necrotic Tissue: Eschar N/A Eschar Exposed Structures: Fascia: No Fascia: No Fat Layer (Subcutaneous Fat Layer (Subcutaneous Fat Layer (Subcutaneous Tissue) Exposed: Yes Tissue) Exposed: No Tissue) Exposed: No Fascia: No [name], [name] ([id number]) Tendon: No Tendon: No Tendon: No Muscle: No Muscle: No Muscle: No Joint: No Joint: No Joint: No Bone: No Bone: No Bone: No Limited to Skin Breakdown Limited to Skin Breakdown Epithelialization: None None None Wound Number: 8 9 N/A Photos: N/A Wound Location: Right Lower Leg - Anterior Right, Proximal Lower Leg N/A Wounding Event: Trauma Trauma N/A Primary Etiology: Trauma, Other  Trauma, Other N/A Comorbid History: Cataracts, Glaucoma, Asthma, Cataracts, Glaucoma, Asthma, N/A Chronic Obstructive Chronic Obstructive Pulmonary Disease (COPD), Pulmonary Disease (COPD), Arrhythmia, Hypertension Arrhythmia, Hypertension Date Acquired: 03/05/2019 03/05/2019 N/A Weeks of Treatment: 1 1 N/A Wound Status: Open Healed - Epithelialized N/A Measurements L x W x D 2x2.2x0.1 0x0x0 N/A (cm) Area (cm) : 3.456 0 N/A Volume (cm) : 0.346 0 N/A % Reduction in Area: -81.80% 100.00% N/A % Reduction in Volume: -82.10% 100.00% N/A Classification: Full Thickness Without Partial Thickness N/A Exposed Support Structures Exudate Amount: Medium None Present N/A Exudate Type: Serosanguineous N/A N/A Exudate Color: red, brown N/A N/A Wound Margin: Flat and Intact N/A N/A Granulation Amount: Medium (34-66%) None Present (0%) N/A Granulation Quality: Red N/A N/A Necrotic Amount: Medium (34-66%) Small (1-33%) N/A Necrotic Tissue: Eschar Eschar N/A Exposed Structures: Fat Layer (Subcutaneous Fascia: No N/A Tissue) Exposed: Yes Fat Layer (Subcutaneous Fascia: No Tissue) Exposed: No Tendon: No Tendon: No Muscle: No Muscle: No Joint: No Joint: No Bone: No Bone: No Limited to Skin Breakdown Epithelialization:  None None N/A Treatment Notes Electronic Signature(s) Signed: 03/17/2019 4:43:58 PM By: [name], [name] ([id number]) Entered By: Montey Hora on 03/17/2019 14:23:22 [name] ([id number]) -------------------------------------------------------------------------------- Multi-Disciplinary Care Plan Details Patient Name: [name]. Date of Service: 03/17/2019 2:00 PM Medical Record Number: [id number] Patient Account Number: [id number] Date of Birth/Sex: April 18, 1928 (85 y.o. F) Treating RN: Montey Hora Primary Care Tymeka Privette: Park Liter Other Clinician: Referring Ercilia Bettinger: Park Liter Treating Berle Fitz/Extender: Beverly Gust in Treatment: 1 Active Inactive Abuse / Safety / Falls / Self Care Management Nursing Diagnoses: Potential for falls Goals: Patient will not experience any injury related to falls Date Initiated: 03/10/2019 Target Resolution Date: 06/13/2019 Goal Status: Active Interventions: Assess fall risk on admission and as needed Notes: Necrotic Tissue Nursing Diagnoses: Impaired tissue integrity related to necrotic/devitalized tissue Goals: Patient/caregiver will verbalize understanding of reason and process for debridement of necrotic tissue Date Initiated: 03/10/2019 Target Resolution Date: 06/13/2019 Goal Status: Active Interventions: Provide education on necrotic tissue and debridement process Notes: Orientation to the Wound Care Program Nursing Diagnoses: Knowledge deficit related to the wound healing center program Goals: Patient/caregiver will verbalize understanding of the [facility] Program Date Initiated: 03/10/2019 Target Resolution Date: 06/13/2019 Goal Status: Active Interventions: Provide education on orientation to the wound center [place], Glena H. ([id number]) Notes: Wound/Skin Impairment Nursing Diagnoses: Impaired tissue integrity Goals: Ulcer/skin breakdown will heal within  14 weeks Date Initiated: 03/10/2019 Target Resolution Date: 06/13/2019 Goal Status: Active Interventions: Assess patient/caregiver ability to obtain necessary supplies Assess patient/caregiver ability to perform ulcer/skin care regimen upon admission and as needed Assess ulceration(s) every visit Notes: Electronic Signature(s) Signed: 03/17/2019 4:43:58 PM By: Montey Hora Entered By: Montey Hora on 03/17/2019 14:23:13 Alejo, Dakari H. ([id number]) -------------------------------------------------------------------------------- Pain Assessment Details Patient Name: [name]. Date of Service: 03/17/2019 2:00 PM Medical Record Number: [id number] Patient Account Number: [id number] Date of Birth/Sex: [birth year] (85 y.o. F) Treating RN: [name] Primary Care Torian Thoennes: Park Liter Other Clinician: Referring Jillianne Gamino: Park Liter Treating Dineen Conradt/Extender: Beverly Gust in Treatment: 1 Active Problems Location of Pain Severity and Description of Pain Patient Has Paino No Site Locations Pain Management and Medication Current Pain Management: Electronic Signature(s) Signed: 03/17/2019 3:04:02 PM By: [name] Entered By: [name] on 03/17/2019 14:02:06 Ring, Alliya H. ([id number]) -------------------------------------------------------------------------------- Wound Assessment Details Patient Name: Baria, Malayshia H. Date of Service: 03/17/2019 2:00 PM Medical Record Number: [id number] Patient Account Number: [id number] Date of Birth/Sex:  December 13, 1927 (85 y.o. F) Treating RN: Montey Hora Primary Care Shawna Kiener: Park Liter Other Clinician: Referring Deloras Reichard: Park Liter Treating Anne Boltz/Extender: Beverly Gust in Treatment: 1 Wound Status Wound Number: 10 Primary Trauma, Other Etiology: Wound Location: Right, Distal Lower Leg Wound Healed - Epithelialized Wounding Event: Trauma Status: Date Acquired:  03/05/2019 Comorbid Cataracts, Glaucoma, Asthma, Chronic Weeks Of Treatment: 1 History: Obstructive Pulmonary Disease (COPD), Clustered Wound: No Arrhythmia, Hypertension Photos Wound Measurements Length: (cm) 0 % Reductio Width: (cm) 0 % Reductio Depth: (cm) 0 Epithelial Area: (cm) 0 Tunneling Volume: (cm) 0 Undermini n in Area: 100% n in Volume: 100% ization: None : No ng: No Wound Description Classification: Partial Thickness Foul Odor Exudate Amount: None Present Slough/Fib After Cleansing: No rino No Wound Bed Granulation Amount: None Present (0%) Exposed Structure Necrotic Amount: Large (67-100%) Fascia Exposed: No Necrotic Quality: Eschar Fat Layer (Subcutaneous Tissue) Exposed: No Tendon Exposed: No Muscle Exposed: No Joint Exposed: No Bone Exposed: No Limited to Skin Breakdown Electronic Signature(s) Signed: 03/17/2019 4:43:58 PM By: [name], [name] ([id number]) Entered By: Montey Hora on 03/17/2019 14:22:53 Sportsman, [name] ([id number]) -------------------------------------------------------------------------------- Wound Assessment Details Patient Name: Colegrove, Ceasia H. Date of Service: 03/17/2019 2:00 PM Medical Record Number: [id number] Patient Account Number: [id number] Date of Birth/Sex: Nov 22, 1927 (85 y.o. F) Treating RN: Montey Hora Primary Care Josetta Wigal: Park Liter Other Clinician: Referring Cole Klugh: Park Liter Treating Anayansi Rundquist/Extender: Beverly Gust in Treatment: 1 Wound Status Wound Number: 11 Primary Trauma, Other Etiology: Wound Location: Right, Lateral Lower Leg Wound Healed - Epithelialized Wounding Event: Trauma Status: Date Acquired: 03/05/2019 Comorbid Cataracts, Glaucoma, Asthma, Chronic Weeks Of Treatment: 1 History: Obstructive Pulmonary Disease (COPD), Clustered Wound: No Arrhythmia, Hypertension Photos Wound Measurements Length: (cm) 0 % Reductio Width: (cm) 0 %  Reductio Depth: (cm) 0 Epithelial Area: (cm) 0 Tunneling Volume: (cm) 0 Undermini n in Area: 100% n in Volume: 100% ization: None : No ng: No Wound Description Classification: Partial Thickness Foul Odor Exudate Amount: None Present Slough/Fib After Cleansing: No rino No Wound Bed Granulation Amount: None Present (0%) Exposed Structure Necrotic Amount: None Present (0%) Fascia Exposed: No Fat Layer (Subcutaneous Tissue) Exposed: No Tendon Exposed: No Muscle Exposed: No Joint Exposed: No Bone Exposed: No Limited to Skin Breakdown Electronic Signature(s) Signed: 03/17/2019 4:43:58 PM By: [name], [name] ([id number]) Entered By: Montey Hora on 03/17/2019 14:22:54 Safer, [name] ([id number]) -------------------------------------------------------------------------------- Wound Assessment Details Patient Name: Vandyken, Harbor H. Date of Service: 03/17/2019 2:00 PM Medical Record Number: [id number] Patient Account Number: [id number] Date of Birth/Sex: 1928-01-21 (85 y.o. F) Treating RN: [name] Primary Care Lakeitha Basques: Park Liter Other Clinician: Referring Waldo Damian: Park Liter Treating Meilani Edmundson/Extender: Beverly Gust in Treatment: 1 Wound Status Wound Number: 7 Primary Trauma, Other Etiology: Wound Location: Right Lower Leg - Medial Wound Open Wounding Event: Trauma Status: Date Acquired: 03/05/2019 Comorbid Cataracts, Glaucoma, Asthma, Chronic Weeks Of Treatment: 1 History: Obstructive Pulmonary Disease (COPD), Clustered Wound: No Arrhythmia, Hypertension Photos Wound Measurements Length: (cm) 0.3 Width: (cm) 1.5 Depth: (cm) 0.1 Area: (cm) 0.353 Volume: (cm) 0.035 % Reduction in Area: 25.1% % Reduction in Volume: 25.5% Epithelialization: None Tunneling: No Undermining: No Wound Description Full Thickness Without Exposed Support Foul Odo Classification: Structures Slough/F Wound Margin: Flat and  Intact Exudate Medium Amount: Exudate Type: Serosanguineous Exudate Color: red, brown r After Cleansing: No ibrino Yes Wound Bed Granulation Amount: Medium (34-66%) Exposed Structure Granulation Quality: Red Fascia Exposed: No Necrotic Amount: Medium (34-66%) Fat Layer (Subcutaneous Tissue) Exposed:  Yes Necrotic Quality: Eschar Tendon Exposed: No Muscle Exposed: No Joint Exposed: No Bone Exposed: No KINZLY, PIERRELOUIS ([id number]) Electronic Signature(s) Signed: 03/17/2019 3:04:02 PM By: [name] Entered By: [name] on 03/17/2019 14:13:28 Cuadrado, Wladyslawa H. ([id number]) -------------------------------------------------------------------------------- Wound Assessment Details Patient Name: Duce, Ariell H. Date of Service: 03/17/2019 2:00 PM Medical Record Number: [id number] Patient Account Number: [id number] Date of Birth/Sex: 12/14/1927 (85 y.o. F) Treating RN: [name] Primary Care Joclynn Lumb: Park Liter Other Clinician: Referring Saleha Kalp: Park Liter Treating Zadia Uhde/Extender: Beverly Gust in Treatment: 1 Wound Status Wound Number: 8 Primary Trauma, Other Etiology: Wound Location: Right Lower Leg - Anterior Wound Open Wounding Event: Trauma Status: Date Acquired: 03/05/2019 Comorbid Cataracts, Glaucoma, Asthma, Chronic Weeks Of Treatment: 1 History: Obstructive Pulmonary Disease (COPD), Clustered Wound: No Arrhythmia, Hypertension Photos Wound Measurements Length: (cm) 2 Width: (cm) 2.2 Depth: (cm) 0.1 Area: (cm) 3.456 Volume: (cm) 0.346 % Reduction in Area: -81.8% % Reduction in Volume: -82.1% Epithelialization: None Tunneling: No Undermining: No Wound Description Full Thickness Without Exposed Support Foul Odo Classification: Structures Slough/F Wound Margin: Flat and Intact Exudate Medium Amount: Exudate Type: Serosanguineous Exudate Color: red, brown r After Cleansing: No ibrino Yes Wound Bed Granulation  Amount: Medium (34-66%) Exposed Structure Granulation Quality: Red Fascia Exposed: No Necrotic Amount: Medium (34-66%) Fat Layer (Subcutaneous Tissue) Exposed: Yes Necrotic Quality: Eschar Tendon Exposed: No Muscle Exposed: No Joint Exposed: No Bone Exposed: No BELLAMARIE, PFLUG ([id number]) Electronic Signature(s) Signed: 03/17/2019 3:04:02 PM By: [name] Entered By: [name] on 03/17/2019 14:13:54 [place], Mazelle H. ([id number]) -------------------------------------------------------------------------------- Wound Assessment Details Patient Name: Crysler, Eulala H. Date of Service: 03/17/2019 2:00 PM Medical Record Number: [id number] Patient Account Number: [id number] Date of Birth/Sex: 1928-08-13 (85 y.o. F) Treating RN: Montey Hora Primary Care Shaniece Bussa: Park Liter Other Clinician: Referring Annaly Skop: Park Liter Treating Furman Trentman/Extender: Beverly Gust in Treatment: 1 Wound Status Wound Number: 9 Primary Trauma, Other Etiology: Wound Location: Right, Proximal Lower Leg Wound Healed - Epithelialized Wounding Event: Trauma Status: Date Acquired: 03/05/2019 Comorbid Cataracts, Glaucoma, Asthma, Chronic Weeks Of Treatment: 1 History: Obstructive Pulmonary Disease (COPD), Clustered Wound: No Arrhythmia, Hypertension Photos Wound Measurements Length: (cm) 0 % Reductio Width: (cm) 0 % Reductio Depth: (cm) 0 Epithelial Area: (cm) 0 Tunneling Volume: (cm) 0 Undermini n in Area: 100% n in Volume: 100% ization: None : No ng: No Wound Description Classification: Partial Thickness Foul Odor Exudate Amount: None Present Slough/Fib After Cleansing: No rino Yes Wound Bed Granulation Amount: None Present (0%) Exposed Structure Necrotic Amount: Small (1-33%) Fascia Exposed: No Necrotic Quality: Eschar Fat Layer (Subcutaneous Tissue) Exposed: No Tendon Exposed: No Muscle Exposed: No Joint Exposed: No Bone Exposed: No Limited to Skin  Breakdown Electronic Signature(s) Signed: 03/17/2019 4:43:58 PM By: [name], [name] ([id number]) Entered By: Montey Hora on 03/17/2019 14:21:54 Hill, [name] ([id number]) -------------------------------------------------------------------------------- Vitals Details Patient Name: Fluegge, Afrika H. Date of Service: 03/17/2019 2:00 PM Medical Record Number: [id number] Patient Account Number: [id number] Date of Birth/Sex: 11/14/1927 (85 y.o. F) Treating RN: [name] Primary Care Alberto Pina: Park Liter Other Clinician: Referring Zayvien Canning: Park Liter Treating Chasity Outten/Extender: Beverly Gust in Treatment: 1 Vital Signs Time Taken: 14:02 Temperature (F): 98.4 Height (in): 60 Pulse (bpm): 92 Weight (lbs): 92 Respiratory Rate (breaths/min): 16 Body Mass Index (BMI): 18 Blood Pressure (mmHg): 132/60 Reference Range: 80 - 120 mg / dl Electronic Signature(s) Signed: 03/17/2019 3:04:02 PM By: [name] Entered By: [name] on 03/17/2019 14:02:25

## 2019-03-24 ENCOUNTER — Other Ambulatory Visit: Payer: Self-pay

## 2019-03-24 ENCOUNTER — Encounter: Payer: Medicare Other | Admitting: Physician Assistant

## 2019-03-24 DIAGNOSIS — I4891 Unspecified atrial fibrillation: Secondary | ICD-10-CM | POA: Diagnosis not present

## 2019-03-24 DIAGNOSIS — L97812 Non-pressure chronic ulcer of other part of right lower leg with fat layer exposed: Secondary | ICD-10-CM | POA: Diagnosis not present

## 2019-03-24 DIAGNOSIS — I872 Venous insufficiency (chronic) (peripheral): Secondary | ICD-10-CM | POA: Diagnosis not present

## 2019-03-24 DIAGNOSIS — J449 Chronic obstructive pulmonary disease, unspecified: Secondary | ICD-10-CM | POA: Diagnosis not present

## 2019-03-24 DIAGNOSIS — Z87891 Personal history of nicotine dependence: Secondary | ICD-10-CM | POA: Diagnosis not present

## 2019-03-24 DIAGNOSIS — H409 Unspecified glaucoma: Secondary | ICD-10-CM | POA: Diagnosis not present

## 2019-03-24 DIAGNOSIS — Z8249 Family history of ischemic heart disease and other diseases of the circulatory system: Secondary | ICD-10-CM | POA: Diagnosis not present

## 2019-03-24 DIAGNOSIS — I10 Essential (primary) hypertension: Secondary | ICD-10-CM | POA: Diagnosis not present

## 2019-03-25 ENCOUNTER — Telehealth: Payer: Self-pay

## 2019-03-25 NOTE — Progress Notes (Signed)
TIMAYA, BOJARSKI ([id number]) Visit Report for 03/24/2019 Chief Complaint Document Details Patient Name: [name], [name]. Date of Service: 03/24/2019 2:45 PM Medical Record Number: [id number] Patient Account Number: [id number] Date of Birth/Sex: [birth year] (85 y.o. F) Treating RN: Montey Hora Primary Care Provider: Park Liter Other Clinician: Referring Provider: Park Liter Treating Provider/Extender: [name], [name] Weeks in Treatment: 2 Information Obtained from: Patient Chief Complaint Right leg ulcers Electronic Signature(s) Signed: 03/24/2019 6:14:16 PM By: Worthy Keeler PA-C Entered By: Worthy Keeler on 03/24/2019 15:08:30 Ruda, Tamina Lemmie Evens ([id number]) -------------------------------------------------------------------------------- Debridement Details Patient Name: [name] Date of Service: 03/24/2019 2:45 PM Medical Record Number: [id number] Patient Account Number: [id number] Date of Birth/Sex: 07/29/1928 (85 y.o. F) Treating RN: Montey Hora Primary Care Provider: Park Liter Other Clinician: Referring Provider: Park Liter Treating Provider/Extender: [name], [name] Weeks in Treatment: 2 Debridement Performed for Wound #8 Right,Anterior Lower Leg Assessment: Performed By: Physician STONE III, [name] E., PA-C Debridement Type: Debridement Level of Consciousness (Pre- Awake and Alert procedure): Pre-procedure Verification/Time Yes - 15:12 Out Taken: Start Time: 15:12 Pain Control: Lidocaine 4% Topical Solution Total Area Debrided (L x W): 1.2 (cm) x 1.5 (cm) = 1.8 (cm) Tissue and other material Viable, Non-Viable, Slough, Subcutaneous, Slough debrided: Level: Skin/Subcutaneous Tissue Debridement Description: Excisional Instrument: Curette Bleeding: Minimum Hemostasis Achieved: Pressure End Time: 15:15 Procedural Pain: 0 Post Procedural Pain: 0 Response to Treatment: Procedure was tolerated well Level of  Consciousness Awake and Alert (Post-procedure): Post Debridement Measurements of Total Wound Length: (cm) 1.2 Width: (cm) 1.5 Depth: (cm) 0.2 Volume: (cm) 0.283 Character of Wound/Ulcer Post Debridement: Improved Post Procedure Diagnosis Same as Pre-procedure Electronic Signature(s) Signed: 03/24/2019 4:59:24 PM By: Montey Hora Signed: 03/24/2019 6:14:16 PM By: Worthy Keeler PA-C Entered By: Montey Hora on 03/24/2019 15:15:18 Tijerino, Juliene H. ([id number]) -------------------------------------------------------------------------------- HPI Details Patient Name: [name] Date of Service: 03/24/2019 2:45 PM Medical Record Number: [id number] Patient Account Number: [id number] Date of Birth/Sex: Jun 18, 1928 (85 y.o. F) Treating RN: Montey Hora Primary Care Provider: Park Liter Other Clinician: Referring Provider: Park Liter Treating Provider/Extender: [name], [name] Weeks in Treatment: 2 History of Present Illness HPI Description: 02/12/18 ADMISSION This is an 85 year old woman who is recently moved to [place] from [place]. Her story began in February where she fell on some ice suffering extensive lacerations to her bilateral lower extremities. She is able to show me extensive pictures of the right lower leg but with going to a wound care center and [place] 3 times a week these eventually closed over. She has been left with 1 area on the upper lateral left calf. She has been applying Neosporin to this and applying a bandage. Currently this measures 2 x 1.5 cm. The patient is not a diabetic. She is an ex-smoker quitting 40 years ago. She does have COPD by description. ABIs in our clinic were 1.32 on the right and 1.03 on the left 02/19/18; right lower leg wound which was initially trauma. The area that we look that last week is smaller. Still covered in a nonviable surface however. We are using Iodoflex 02/26/18; right lower leg wound which was  initially trauma in the setting of chronic venous insufficiency. Surface of the wound looks much better healthy granulation advancing epithelialization. We have good edema control 03/05/18; right lower leg wound which was initially trauma in the setting of chronic venous insufficiency. She continues to make nice progress here. We have good edema control oHe arrives today with a new traumatic  laceration on her right dorsal forearm which she states happened while she was putting on a sweatshirt 03/12/18; right lower leg wound as closed however it still looks vulnerable. She has chronic venous insufficiency. oThe new wound from last week a traumatic area on her right dorsal or arm unfortunately does not have a viable surface. I remove some nonviable skin and necrotic subcutaneous debris from the wound surface. Hemostasis with silver nitrate and direct pressure 03/19/18; right lower leg wound is closed. She has chronic venous insufficiency and will need ongoing compression oThe new wounds from 2 weeks ago on the right upper elbow is closed. The area on her right dorsal forearm continues to have a nonviable surface requiring debridement 03/26/18; we'll need to look into ongoing compression for her legs. 2 wounds on the right upper elbow is closed. The area on the right dorsal forearm looks better. Hydrofera Blue secondary to hypertrophic granulation 04/02/18; he has compression stockings although she did not wear them today. Severe chronic venous insufficiency. She has 2 wounds on the right upper elbow which I said were closed last week which actually are although they're very small and she has a smaller clean wound on the right dorsal forearm. We've been using Hydrofera Blue secondary to some upper granulation. All of this looks better 04/09/18-She is seen in follow up evaluation for a right elbow and right forearm skin tear. The right elbow is healed. We will continue with same treatment plan and she'll  follow next week 04/16/18-She is seen in follow-up evaluation for right forearm skin tear, this is essentially healed. We will cover with foam border and she will follow-up next week 04/23/18; the patient arrives with a new skin tear on her right dorsal arm just below the elbow. She got this while carrying a box. She has a linear skin tear. There is no depth I don't think this should've been sutured. There is a skin flap medially I'm not sure if this will be viable or not 04/30/18; the new skin tear from last week unfortunately has remained viable in terms of the skin flap. She has a small open area that looks healthy. Using moistened silver collagen 05/07/18; right dorsal arm skin tear. Nonviable tissue over the remnants of the wound. We've been using silver collagen 05/21/18; right dorsal arm skin tear. Nonviable tissue over a small remnant of the wound was washed off. We've been using silver collagen which we will continue 06/04/18; the right dorsal arm skin tear has healed. She arrives today with a traumatic wound just above the olecranon of her left elbow. This is a skin tear with a nonviable nonadherent flap which was removed. We'll use silver collagen here. Also noteworthy she arrives without her compression stockings 06/18/18; the area just above the olecranon of her left elbow. It is smaller and generally has a healthy surface. I was surprised NAZARETH, NORENBERG. ([id number]) to learn that the patient is actually changing this herself although. Appears this week she was putting some topical lidocaine on this that she bought over-the-counter at the drugstore. She did get supplies at home she didn't know how to put it on. She drove herself here today i.e. not eligible for home health Readmission: 11/04/18 on evaluation today patient presents for evaluation our clinic news to include the she has on her lower extremity which occurred as the result of having hit this on a piece of furniture. This was  back in December. She states that her daughter has been trying to  get her to come into have this evaluated here the wound care center but she was being somewhat stubborn. Nonetheless upon evaluation today the wound really appears to be potentially healed she does have some swelling of lower extremity which I think will come into play. With that being said he tells me that even yesterday this was still making clear fluid and therefore there may be some issues with continued problems with the swelling and drainage secondary to her venous stasis. Nonetheless overall wound appears to be doing fairly well which is good news. 11/11/18 on evaluation today patient actually appears to be doing excellent in the area of question that was green just the day before he saw her last time actually appears to have not drain that always in past week. I think this is done very well and at this point though she's had some discomfort I do not see any evidence of open wound which he needs to continue see wound care. Readmission: 03/10/19 on evaluation today patient presents for reevaluation here in our clinic concerning issues that she actually is having with the new problem on her right lower leg due to trauma that occurred when a car door shut on her leg last Thursday. This is not been quite a week and she does have several skin tears unfortunately that resulted from this injury. Fortunately there's no signs of active infection at this time. No fevers, chills, nausea, or vomiting noted at this time. She does have some hot tissue noted at this point that has me concerned about the possibility of needing to move this in order to allow the areas to heal appropriately. Fortunately there's no signs of active infection at this time. 03/17/19-Patient returns to clinic at 1 week, we have been using Prisma on the larger wounds with Kerlix and Xeroform. As many as 5 wounds were number last time 3 of which have closed, leaving the  right proximal tibial and right distal medial leg wound both of which are looking good 03/24/19 on evaluation today patient appears to be doing better in regard to her wounds at this time. Fortunately there is no sign of active infection at this time. No fevers chills noted. She's been tolerating the dressing changes without complication which is good news she's been performing these herself it sounds like home health is not going to be coming out it sounds as if they feel like she may be able to take care of yourself and not be technically homebound. Nonetheless though that is unfortunate it does seem like she's done fairly well with taking care of yourself from the standpoint of the dressings. Electronic Signature(s) Signed: 03/24/2019 6:14:16 PM By: Worthy Keeler PA-C Entered By: Worthy Keeler on 03/24/2019 15:20:32 RAQUELLE, PIETRO ([id number]) -------------------------------------------------------------------------------- Physical Exam Details Patient Name: Perreira, Humaira H. Date of Service: 03/24/2019 2:45 PM Medical Record Number: [id number] Patient Account Number: [id number] Date of Birth/Sex: 1928-08-25 (85 y.o. F) Treating RN: Montey Hora Primary Care Provider: Park Liter Other Clinician: Referring Provider: Park Liter Treating Provider/Extender: [name], [name] Weeks in Treatment: 2 Constitutional Well-nourished and well-hydrated in no acute distress. Respiratory normal breathing without difficulty. Psychiatric this patient is able to make decisions and demonstrates good insight into disease process. Alert and Oriented x 3. pleasant and cooperative. Notes Patient's wound bed currently showed signs of good granulation at this time there was some necrotic material noted on the main/largest wound of the right lower extremity this did require sharp debridement today which was  performed without complication post debridement wound bed appears to be doing much  better. She does not appear to have two significant lower extremity swelling. Electronic Signature(s) Signed: 03/24/2019 6:14:16 PM By: Worthy Keeler PA-C Entered By: Worthy Keeler on 03/24/2019 15:21:15 MISSEY, HASLEY ([id number]) -------------------------------------------------------------------------------- Physician Orders Details Patient Name: [name] Date of Service: 03/24/2019 2:45 PM Medical Record Number: [id number] Patient Account Number: [id number] Date of Birth/Sex: [birth year] (85 y.o. F) Treating RN: Montey Hora Primary Care Provider: Park Liter Other Clinician: Referring Provider: Park Liter Treating Provider/Extender: [name], [name] Weeks in Treatment: 2 Verbal / Phone Orders: No Diagnosis Coding ICD-10 Coding Code Description I87.2 Venous insufficiency (chronic) (peripheral) L97.812 Non-pressure chronic ulcer of other part of right lower leg with fat layer exposed Wound Cleansing Wound #12 Left,Midline Forearm o Clean wound with Normal Saline. o Dial antibacterial soap, wash wounds, rinse and pat dry prior to dressing wounds o May Shower, gently pat wound dry prior to applying new dressing. Wound #7 Right,Medial Lower Leg o Clean wound with Normal Saline. o Dial antibacterial soap, wash wounds, rinse and pat dry prior to dressing wounds o May Shower, gently pat wound dry prior to applying new dressing. Wound #8 Right,Anterior Lower Leg o Clean wound with Normal Saline. o Dial antibacterial soap, wash wounds, rinse and pat dry prior to dressing wounds o May Shower, gently pat wound dry prior to applying new dressing. Anesthetic (add to Medication List) Wound #12 Left,Midline Forearm o Topical Lidocaine 4% cream applied to wound bed prior to debridement (In Clinic Only). Wound #7 Right,Medial Lower Leg o Topical Lidocaine 4% cream applied to wound bed prior to debridement (In Clinic Only). Wound #8  Right,Anterior Lower Leg o Topical Lidocaine 4% cream applied to wound bed prior to debridement (In Clinic Only). Primary Wound Dressing Wound #12 Left,Midline Forearm o Xeroform Wound #7 Right,Medial Lower Leg o Xeroform Wound #8 Right,Anterior Lower Leg o Xeroform Secondary Dressing Wound #12 Left,Midline Forearm Dozier, Falynn H. ([id number]) o ABD and Kerlix/Conform - secure with netting Wound #7 Right,Medial Lower Leg o ABD and Kerlix/Conform - secure with netting Wound #8 Right,Anterior Lower Leg o ABD and Kerlix/Conform - secure with netting Dressing Change Frequency Wound #12 Left,Midline Forearm o Change Dressing Monday, Wednesday, Friday Wound #7 Right,Medial Lower Leg o Change Dressing Monday, Wednesday, Friday Wound #8 Right,Anterior Lower Leg o Change Dressing Monday, Wednesday, Friday Follow-up Appointments o Return Appointment in 1 week. Home Health Wound #12 Left,Midline Forearm o [place] Visits - [place] Nurse may visit PRN to address patientos wound care needs. o FACE TO FACE ENCOUNTER: MEDICARE and MEDICAID PATIENTS: I certify that this patient is under my care and that I had a face-to-face encounter that meets the physician face-to-face encounter requirements with this patient on this date. The encounter with the patient was in whole or in part for the following MEDICAL CONDITION: (primary reason for [place]) MEDICAL NECESSITY: I certify, that based on my findings, NURSING services are a medically necessary home health service. HOME BOUND STATUS: I certify that my clinical findings support that this patient is homebound (i.e., Due to illness or injury, pt requires aid of supportive devices such as crutches, cane, wheelchairs, walkers, the use of special transportation or the assistance of another person to leave their place of residence. There is a normal inability to leave the home and doing  so requires considerable and taxing effort. Other absences are for medical reasons / religious services  and are infrequent or of short duration when for other reasons). o If current dressing causes regression in wound condition, may D/C ordered dressing product/s and apply Normal Saline Moist Dressing daily until next [place] / Other MD appointment. [place] of regression in wound condition at [phone]. o Please direct any NON-WOUND related issues/requests for orders to patient's Primary Care Physician Wound #13 Left,Midline Lower Leg o [place] Visits - [place] Nurse may visit PRN to address patientos wound care needs. o FACE TO FACE ENCOUNTER: MEDICARE and MEDICAID PATIENTS: I certify that this patient is under my care and that I had a face-to-face encounter that meets the physician face-to-face encounter requirements with this patient on this date. The encounter with the patient was in whole or in part for the following MEDICAL CONDITION: (primary reason for [place]) MEDICAL NECESSITY: I certify, that based on my findings, NURSING services are a medically necessary home health service. HOME BOUND STATUS: I certify that my clinical findings support that this patient is homebound (i.e., Due to illness or injury, pt requires aid of supportive devices such as crutches, cane, wheelchairs, walkers, the use of special transportation or the assistance of another person to leave their place of residence. There is a normal inability to leave the home and doing so requires considerable and taxing effort. Other absences are for medical reasons / religious services and are infrequent or of short duration when for other reasons). o If current dressing causes regression in wound condition, may D/C ordered dressing product/s and apply Normal Saline Moist Dressing daily until next [place] / Other MD appointment.  [place] of regression in wound condition at [phone]. o Please direct any NON-WOUND related issues/requests for orders to patient's Primary Care Physician ANYAE, GRIFFITH ([id number]) Wound #7 Right,Medial Lower Leg o [place] [facility]its - [place] Nurse may visit PRN to address patientos wound care needs. o FACE TO FACE ENCOUNTER: MEDICARE and MEDICAID PATIENTS: I certify that this patient is under my care and that I had a face-to-face encounter that meets the physician face-to-face encounter requirements with this patient on this date. The encounter with the patient was in whole or in part for the following MEDICAL CONDITION: (primary reason for [place]) MEDICAL NECESSITY: I certify, that based on my findings, NURSING services are a medically necessary home health service. HOME BOUND STATUS: I certify that my clinical findings support that this patient is homebound (i.e., Due to illness or injury, pt requires aid of supportive devices such as crutches, cane, wheelchairs, walkers, the use of special transportation or the assistance of another person to leave their place of residence. There is a normal inability to leave the home and doing so requires considerable and taxing effort. Other absences are for medical reasons / religious services and are infrequent or of short duration when for other reasons). o If current dressing causes regression in wound condition, may D/C ordered dressing product/s and apply Normal Saline Moist Dressing daily until next [place] / Other MD appointment. [facility] of regression in wound condition at [phone]. o Please direct any NON-WOUND related issues/requests for orders to patient's Primary Care Physician Wound #8 [place] Visits - [place] Nurse may visit PRN to address patientos wound care  needs. o FACE TO FACE ENCOUNTER: MEDICARE and MEDICAID PATIENTS: I certify that this patient is  under my care and that I had a face-to-face encounter that meets the physician face-to-face encounter requirements with this patient on this date. The encounter with the patient was in whole or in part for the following MEDICAL CONDITION: (primary reason for [place]) MEDICAL NECESSITY: I certify, that based on my findings, NURSING services are a medically necessary home health service. HOME BOUND STATUS: I certify that my clinical findings support that this patient is homebound (i.e., Due to illness or injury, pt requires aid of supportive devices such as crutches, cane, wheelchairs, walkers, the use of special transportation or the assistance of another person to leave their place of residence. There is a normal inability to leave the home and doing so requires considerable and taxing effort. Other absences are for medical reasons / religious services and are infrequent or of short duration when for other reasons). o If current dressing causes regression in wound condition, may D/C ordered dressing product/s and apply Normal Saline Moist Dressing daily until next [place] / Other MD appointment. [place] of regression in wound condition at [phone]. o Please direct any NON-WOUND related issues/requests for orders to patient's Primary Care Physician Electronic Signature(s) Signed: 03/24/2019 4:59:24 PM By: Montey Hora Signed: 03/24/2019 6:14:16 PM By: Worthy Keeler PA-C Entered By: Montey Hora on 03/24/2019 15:18:05 Carranza, Shawana HMarland Kitchen ([id number]) -------------------------------------------------------------------------------- Problem List Details Patient Name: Sledd, Pranathi H. Date of Service: 03/24/2019 2:45 PM Medical Record Number: [id number] Patient Account Number: [id number] Date of Birth/Sex: October 04, 1927 (85 y.o. F) Treating RN:  Montey Hora Primary Care Provider: Park Liter Other Clinician: Referring Provider: Park Liter Treating Provider/Extender: [name], [name] Weeks in Treatment: 2 Active Problems ICD-10 Evaluated Encounter Code Description Active Date Today Diagnosis I87.2 Venous insufficiency (chronic) (peripheral) 03/10/2019 No Yes L97.812 Non-pressure chronic ulcer of other part of right lower leg 03/10/2019 No Yes with fat layer exposed Inactive Problems Resolved Problems Electronic Signature(s) Signed: 03/24/2019 6:14:16 PM By: Worthy Keeler PA-C Entered By: Worthy Keeler on 03/24/2019 15:08:13 Falletta, Susy H. ([id number]) -------------------------------------------------------------------------------- Progress Note Details Patient Name: Christiansen, Abri H. Date of Service: 03/24/2019 2:45 PM Medical Record Number: [id number] Patient Account Number: [id number] Date of Birth/Sex: 11-Sep-1927 (85 y.o. F) Treating RN: Montey Hora Primary Care Provider: Park Liter Other Clinician: Referring Provider: Park Liter Treating Provider/Extender: [name], [name] Weeks in Treatment: 2 Subjective Chief Complaint Information obtained from Patient Right leg ulcers History of Present Illness (HPI) 02/12/18 ADMISSION This is an 85 year old woman who is recently moved to [place] from [place]. Her story began in February where she fell on some ice suffering extensive lacerations to her bilateral lower extremities. She is able to show me extensive pictures of the right lower leg but with going to a wound care center and [place] 3 times a week these eventually closed over. She has been left with 1 area on the upper lateral left calf. She has been applying Neosporin to this and applying a bandage. Currently this measures 2 x 1.5 cm. The patient is not a diabetic. She is an ex-smoker quitting 40 years ago. She does have COPD by description. ABIs in our clinic were 1.32 on the right and  1.03 on the left 02/19/18; right lower leg wound which was initially trauma. The area that we look that last week is smaller. Still covered in a nonviable surface however. We are using Iodoflex 02/26/18; right lower leg wound which was initially trauma in the setting of chronic venous insufficiency. Surface  of the wound looks much better healthy granulation advancing epithelialization. We have good edema control 03/05/18; right lower leg wound which was initially trauma in the setting of chronic venous insufficiency. She continues to make nice progress here. We have good edema control He arrives today with a new traumatic laceration on her right dorsal forearm which she states happened while she was putting on a sweatshirt 03/12/18; right lower leg wound as closed however it still looks vulnerable. She has chronic venous insufficiency. The new wound from last week a traumatic area on her right dorsal or arm unfortunately does not have a viable surface. I remove some nonviable skin and necrotic subcutaneous debris from the wound surface. Hemostasis with silver nitrate and direct pressure 03/19/18; right lower leg wound is closed. She has chronic venous insufficiency and will need ongoing compression The new wounds from 2 weeks ago on the right upper elbow is closed. The area on her right dorsal forearm continues to have a nonviable surface requiring debridement 03/26/18; we'll need to look into ongoing compression for her legs. 2 wounds on the right upper elbow is closed. The area on the right dorsal forearm looks better. Hydrofera Blue secondary to hypertrophic granulation 04/02/18; he has compression stockings although she did not wear them today. Severe chronic venous insufficiency. She has 2 wounds on the right upper elbow which I said were closed last week which actually are although they're very small and she has a smaller clean wound on the right dorsal forearm. We've been using Hydrofera Blue  secondary to some upper granulation. All of this looks better 04/09/18-She is seen in follow up evaluation for a right elbow and right forearm skin tear. The right elbow is healed. We will continue with same treatment plan and she'll follow next week 04/16/18-She is seen in follow-up evaluation for right forearm skin tear, this is essentially healed. We will cover with foam border and she will follow-up next week 04/23/18; the patient arrives with a new skin tear on her right dorsal arm just below the elbow. She got this while carrying a box. She has a linear skin tear. There is no depth I don't think this should've been sutured. There is a skin flap medially I'm not sure if this will be viable or not 04/30/18; the new skin tear from last week unfortunately has remained viable in terms of the skin flap. She has a small open area that looks healthy. Using moistened silver collagen 05/07/18; right dorsal arm skin tear. Nonviable tissue over the remnants of the wound. We've been using silver collagen TORYN, DEWALT ([id number]) 05/21/18; right dorsal arm skin tear. Nonviable tissue over a small remnant of the wound was washed off. We've been using silver collagen which we will continue 06/04/18; the right dorsal arm skin tear has healed. She arrives today with a traumatic wound just above the olecranon of her left elbow. This is a skin tear with a nonviable nonadherent flap which was removed. We'll use silver collagen here. Also noteworthy she arrives without her compression stockings 06/18/18; the area just above the olecranon of her left elbow. It is smaller and generally has a healthy surface. I was surprised to learn that the patient is actually changing this herself although. Appears this week she was putting some topical lidocaine on this that she bought over-the-counter at the drugstore. She did get supplies at home she didn't know how to put it on. She drove herself here today i.e. not eligible  for home  health Readmission: 11/04/18 on evaluation today patient presents for evaluation our clinic news to include the she has on her lower extremity which occurred as the result of having hit this on a piece of furniture. This was back in December. She states that her daughter has been trying to get her to come into have this evaluated here the wound care center but she was being somewhat stubborn. Nonetheless upon evaluation today the wound really appears to be potentially healed she does have some swelling of lower extremity which I think will come into play. With that being said he tells me that even yesterday this was still making clear fluid and therefore there may be some issues with continued problems with the swelling and drainage secondary to her venous stasis. Nonetheless overall wound appears to be doing fairly well which is good news. 11/11/18 on evaluation today patient actually appears to be doing excellent in the area of question that was green just the day before he saw her last time actually appears to have not drain that always in past week. I think this is done very well and at this point though she's had some discomfort I do not see any evidence of open wound which he needs to continue see wound care. Readmission: 03/10/19 on evaluation today patient presents for reevaluation here in our clinic concerning issues that she actually is having with the new problem on her right lower leg due to trauma that occurred when a car door shut on her leg last Thursday. This is not been quite a week and she does have several skin tears unfortunately that resulted from this injury. Fortunately there's no signs of active infection at this time. No fevers, chills, nausea, or vomiting noted at this time. She does have some hot tissue noted at this point that has me concerned about the possibility of needing to move this in order to allow the areas to heal appropriately. Fortunately there's no  signs of active infection at this time. 03/17/19-Patient returns to clinic at 1 week, we have been using Prisma on the larger wounds with Kerlix and Xeroform. As many as 5 wounds were number last time 3 of which have closed, leaving the right proximal tibial and right distal medial leg wound both of which are looking good 03/24/19 on evaluation today patient appears to be doing better in regard to her wounds at this time. Fortunately there is no sign of active infection at this time. No fevers chills noted. She's been tolerating the dressing changes without complication which is good news she's been performing these herself it sounds like home health is not going to be coming out it sounds as if they feel like she may be able to take care of yourself and not be technically homebound. Nonetheless though that is unfortunate it does seem like she's done fairly well with taking care of yourself from the standpoint of the dressings. Patient History Information obtained from Patient. Family History Cancer - Siblings,Paternal Grandparents, Diabetes - Siblings, Heart Disease - Mother,Father,Child,Paternal Grandparents, Hypertension - Child, No family history of Hereditary Spherocytosis, Kidney Disease, Lung Disease, Seizures, Stroke, Thyroid Problems, Tuberculosis. Social History Former smoker - quit 40 yrs ago, Marital Status - Divorced, Alcohol Use - Never, Drug Use - No History, Caffeine Use - Rarely. Medical History Eyes LARITZA, VOKES. ([id number]) Patient has history of Cataracts - surgery 2000, Glaucoma Respiratory Patient has history of Asthma, Chronic Obstructive Pulmonary Disease (COPD) Denies history of Pneumothorax, Sleep Apnea, Tuberculosis Cardiovascular  Patient has history of Arrhythmia - a-fib, Hypertension Denies history of Angina, Congestive Heart Failure, Coronary Artery Disease, Deep Vein Thrombosis, Hypotension, Myocardial Infarction, Peripheral Arterial Disease,  Peripheral Venous Disease, Phlebitis, Vasculitis Endocrine Denies history of Type I Diabetes, Type II Diabetes Genitourinary Denies history of End Stage Renal Disease Immunological Denies history of Lupus Erythematosus, Raynaud s, Scleroderma Integumentary (Skin) Denies history of History of Burn, History of pressure wounds Musculoskeletal Denies history of Gout, Rheumatoid Arthritis, Osteoarthritis, Osteomyelitis Neurologic Denies history of Dementia, Neuropathy, Quadriplegia, Paraplegia, Seizure Disorder Oncologic Denies history of Received Chemotherapy, Received Radiation Psychiatric Denies history of Anorexia/bulimia, Confinement Anxiety Hospitalization/Surgery History - Colon cancer. Review of Systems (ROS) Constitutional Symptoms (General Health) Denies complaints or symptoms of Fatigue, Fever, Chills, Marked Weight Change. Respiratory Denies complaints or symptoms of Chronic or frequent coughs, Shortness of Breath. Cardiovascular Complains or has symptoms of LE edema. Denies complaints or symptoms of Chest pain. Psychiatric Denies complaints or symptoms of Anxiety, Claustrophobia. Objective Constitutional Well-nourished and well-hydrated in no acute distress. Vitals Time Taken: 2:48 PM, Height: 60 in, Weight: 92 lbs, BMI: 18, Temperature: 98.2 F, Pulse: 70 bpm, Respiratory Rate: 16 breaths/min, Blood Pressure: 118/62 mmHg. Respiratory normal breathing without difficulty. Psychiatric MAEKAYLA, GIORGIO. ([id number]) this patient is able to make decisions and demonstrates good insight into disease process. Alert and Oriented x 3. pleasant and cooperative. General Notes: Patient's wound bed currently showed signs of good granulation at this time there was some necrotic material noted on the main/largest wound of the right lower extremity this did require sharp debridement today which was performed without complication post debridement wound bed appears to be doing much  better. She does not appear to have two significant lower extremity swelling. Integumentary (Hair, Skin) Wound #12 status is Open. Original cause of wound was Trauma. The wound is located on the Left,Midline Forearm. The wound measures 0.3cm length x 1cm width x 0.1cm depth; 0.236cm^2 area and 0.024cm^3 volume. There is no tunneling or undermining noted. There is a none present amount of drainage noted. The wound margin is flat and intact. There is no granulation within the wound bed. There is a large (67-100%) amount of necrotic tissue within the wound bed including Eschar. Wound #13 status is Open. Original cause of wound was Trauma. The wound is located on the Left,Midline Lower Leg. The wound measures 1cm length x 1cm width x 0.1cm depth; 0.785cm^2 area and 0.079cm^3 volume. There is no tunneling or undermining noted. There is a none present amount of drainage noted. The wound margin is indistinct and nonvisible. Wound #7 status is Open. Original cause of wound was Trauma. The wound is located on the Right,Medial Lower Leg. The wound measures 0.1cm length x 0.1cm width x 0.1cm depth; 0.008cm^2 area and 0.001cm^3 volume. There is Fat Layer (Subcutaneous Tissue) Exposed exposed. There is no tunneling or undermining noted. There is a none present amount of drainage noted. The wound margin is flat and intact. There is no granulation within the wound bed. There is a small (1-33%) amount of necrotic tissue within the wound bed including Eschar. Wound #8 status is Open. Original cause of wound was Trauma. The wound is located on the Right,Anterior Lower Leg. The wound measures 1.2cm length x 1.5cm width x 0.1cm depth; 1.414cm^2 area and 0.141cm^3 volume. There is Fat Layer (Subcutaneous Tissue) Exposed exposed. There is no tunneling or undermining noted. There is a medium amount of serosanguineous drainage noted. The wound margin is flat and intact. There is large (67-100%)  red, hyper -  granulation within the wound bed. There is a small (1-33%) amount of necrotic tissue within the wound bed including Eschar. Assessment Active Problems ICD-10 Venous insufficiency (chronic) (peripheral) Non-pressure chronic ulcer of other part of right lower leg with fat layer exposed Procedures Wound #8 Pre-procedure diagnosis of Wound #8 is a Trauma, Other located on the Right,Anterior Lower Leg . There was a Excisional Skin/Subcutaneous Tissue Debridement with a total area of 1.8 sq cm performed by STONE III, [name] E., PA-C. With the following instrument(s): Curette to remove Viable and Non-Viable tissue/material. Material removed includes Subcutaneous Tissue and Slough and after achieving pain control using Lidocaine 4% Topical Solution. No specimens were taken. A time out was conducted at 15:12, prior to the start of the procedure. A Minimum amount of bleeding was controlled with Pressure. The procedure was tolerated well with a pain level of 0 throughout and a pain level of 0 following the procedure. Post Debridement Measurements: 1.2cm length x 1.5cm width x 0.2cm depth; 0.283cm^3 volume. NIMCO, BIVENS ([id number]) Character of Wound/Ulcer Post Debridement is improved. Post procedure Diagnosis Wound #8: Same as Pre-Procedure Plan Wound Cleansing: Wound #12 Left,Midline Forearm: Clean wound with Normal Saline. Dial antibacterial soap, wash wounds, rinse and pat dry prior to dressing wounds May Shower, gently pat wound dry prior to applying new dressing. Wound #7 Right,Medial Lower Leg: Clean wound with Normal Saline. Dial antibacterial soap, wash wounds, rinse and pat dry prior to dressing wounds May Shower, gently pat wound dry prior to applying new dressing. Wound #8 Right,Anterior Lower Leg: Clean wound with Normal Saline. Dial antibacterial soap, wash wounds, rinse and pat dry prior to dressing wounds May Shower, gently pat wound dry prior to applying new  dressing. Anesthetic (add to Medication List): Wound #12 Left,Midline Forearm: Topical Lidocaine 4% cream applied to wound bed prior to debridement (In Clinic Only). Wound #7 Right,Medial Lower Leg: Topical Lidocaine 4% cream applied to wound bed prior to debridement (In Clinic Only). Wound #8 Right,Anterior Lower Leg: Topical Lidocaine 4% cream applied to wound bed prior to debridement (In Clinic Only). Primary Wound Dressing: Wound #12 Left,Midline Forearm: Xeroform Wound #7 Right,Medial Lower Leg: Xeroform Wound #8 Right,Anterior Lower Leg: Xeroform Secondary Dressing: Wound #12 Left,Midline Forearm: ABD and Kerlix/Conform - secure with netting Wound #7 Right,Medial Lower Leg: ABD and Kerlix/Conform - secure with netting Wound #8 Right,Anterior Lower Leg: ABD and Kerlix/Conform - secure with netting Dressing Change Frequency: Wound #12 Left,Midline Forearm: Change Dressing Monday, Wednesday, Friday Wound #7 Right,Medial Lower Leg: Change Dressing Monday, Wednesday, Friday Wound #8 Right,Anterior Lower Leg: Change Dressing Monday, Wednesday, Friday Follow-up Appointments: Return Appointment in 1 week. Home Health: Wound #12 Left,Midline Forearm: Continue Home Health Visits - [place] Nurse may visit PRN to address patient s wound care needs. FACE TO FACE ENCOUNTER: MEDICARE and MEDICAID PATIENTS: I certify that this patient is under my care and that I had a face-to-face encounter that meets the physician face-to-face encounter requirements with this patient on this date. The ADALIS, GATTI ([id number]) encounter with the patient was in whole or in part for the following MEDICAL CONDITION: (primary reason for [place]) MEDICAL NECESSITY: I certify, that based on my findings, NURSING services are a medically necessary home health service. HOME BOUND STATUS: I certify that my clinical findings support that this patient is homebound (i.e., Due to illness  or injury, pt requires aid of supportive devices such as crutches, cane, wheelchairs, walkers, the use of special  transportation or the assistance of another person to leave their place of residence. There is a normal inability to leave the home and doing so requires considerable and taxing effort. Other absences are for medical reasons / religious services and are infrequent or of short duration when for other reasons). If current dressing causes regression in wound condition, may D/C ordered dressing product/s and apply Normal Saline Moist Dressing daily until next [place] / Other MD appointment. [place] of regression in wound condition at [phone]. Please direct any NON-WOUND related issues/requests for orders to patient's Primary Care Physician Wound #13 Left,Midline Lower Leg: [place] Visits - [place] Nurse may visit PRN to address patient s wound care needs. FACE TO FACE ENCOUNTER: MEDICARE and MEDICAID PATIENTS: I certify that this patient is under my care and that I had a face-to-face encounter that meets the physician face-to-face encounter requirements with this patient on this date. The encounter with the patient was in whole or in part for the following MEDICAL CONDITION: (primary reason for [place]) MEDICAL NECESSITY: I certify, that based on my findings, NURSING services are a medically necessary home health service. HOME BOUND STATUS: I certify that my clinical findings support that this patient is homebound (i.e., Due to illness or injury, pt requires aid of supportive devices such as crutches, cane, wheelchairs, walkers, the use of special transportation or the assistance of another person to leave their place of residence. There is a normal inability to leave the home and doing so requires considerable and taxing effort. Other absences are for medical reasons / religious services and are infrequent or of short  duration when for other reasons). If current dressing causes regression in wound condition, may D/C ordered dressing product/s and apply Normal Saline Moist Dressing daily until next [place] / Other MD appointment. [place] of regression in wound condition at [phone]. Please direct any NON-WOUND related issues/requests for orders to patient's Primary Care Physician Wound #7 Right,Medial Lower Leg: [place] Visits - [place] Nurse may visit PRN to address patient s wound care needs. FACE TO FACE ENCOUNTER: MEDICARE and MEDICAID PATIENTS: I certify that this patient is under my care and that I had a face-to-face encounter that meets the physician face-to-face encounter requirements with this patient on this date. The encounter with the patient was in whole or in part for the following MEDICAL CONDITION: (primary reason for [place]) MEDICAL NECESSITY: I certify, that based on my findings, NURSING services are a medically necessary home health service. HOME BOUND STATUS: I certify that my clinical findings support that this patient is homebound (i.e., Due to illness or injury, pt requires aid of supportive devices such as crutches, cane, wheelchairs, walkers, the use of special transportation or the assistance of another person to leave their place of residence. There is a normal inability to leave the home and doing so requires considerable and taxing effort. Other absences are for medical reasons / religious services and are infrequent or of short duration when for other reasons). If current dressing causes regression in wound condition, may D/C ordered dressing product/s and apply Normal Saline Moist Dressing daily until next [place] / Other MD appointment. [place] of regression in wound condition at [phone]. Please direct any NON-WOUND related issues/requests for orders to patient's Primary  Care Physician Wound #8 Right,Anterior Lower Leg: [place] Visits - [place] Nurse [phone]  visit PRN to address patient s wound care needs. FACE TO FACE ENCOUNTER: MEDICARE and MEDICAID PATIENTS: I certify that this patient is under my care and that I had a face-to-face encounter that meets the physician face-to-face encounter requirements with this patient on this date. The encounter with the patient was in whole or in part for the following MEDICAL CONDITION: (primary reason for [place]) MEDICAL NECESSITY: I certify, that based on my findings, NURSING services are a medically necessary home health service. HOME BOUND STATUS: I certify that my clinical findings support that this patient is homebound (i.e., Due to illness or injury, pt requires aid of supportive devices such as crutches, cane, wheelchairs, walkers, the use of special transportation or the assistance of another person to leave their place of residence. There is a normal inability to leave the home and doing so requires considerable and taxing effort. Other absences are for medical reasons / religious services and are infrequent or of short duration when for other reasons). If current dressing causes regression in wound condition, may D/C ordered dressing product/s and apply Normal Saline Moist Dressing daily until next [place] / Other MD appointment. [place] of regression in wound condition at [phone]. Please direct any NON-WOUND related issues/requests for orders to patient's Primary Care Physician YULIA, ULRICH ([id number]) Yetta Flock suggest we go ahead and continue with the above wound care measures for the next week and the patient is in agreement with that plan. Will subsequently see were things stand at follow-up. If anything changes or worsens meantime shall contact the office and let me know. Please see above for specific wound care orders. We will  see patient for re-evaluation in 1 week(s) here in the clinic. If anything worsens or changes patient will contact our office for additional recommendations. Electronic Signature(s) Signed: 03/24/2019 6:14:16 PM By: Worthy Keeler PA-C Entered By: Worthy Keeler on 03/24/2019 15:21:57 ANNALISE, MCDIARMID ([id number]) -------------------------------------------------------------------------------- ROS/PFSH Details Patient Name: [name] Date of Service: 03/24/2019 2:45 PM Medical Record Number: [id number] Patient Account Number: [id number] Date of Birth/Sex: [birth year] (85 y.o. F) Treating RN: Montey Hora Primary Care Provider: Park Liter Other Clinician: Referring Provider: Park Liter Treating Provider/Extender: [name], [name] Weeks in Treatment: 2 Information Obtained From Patient Constitutional Symptoms (General Health) Complaints and Symptoms: Negative for: Fatigue; Fever; Chills; Marked Weight Change Respiratory Complaints and Symptoms: Negative for: Chronic or frequent coughs; Shortness of Breath Medical History: Positive for: Asthma; Chronic Obstructive Pulmonary Disease (COPD) Negative for: Pneumothorax; Sleep Apnea; Tuberculosis Cardiovascular Complaints and Symptoms: Positive for: LE edema Negative for: Chest pain Medical History: Positive for: Arrhythmia - a-fib; Hypertension Negative for: Angina; Congestive Heart Failure; Coronary Artery Disease; Deep Vein Thrombosis; Hypotension; Myocardial Infarction; Peripheral Arterial Disease; Peripheral Venous Disease; Phlebitis; Vasculitis Psychiatric Complaints and Symptoms: Negative for: Anxiety; Claustrophobia Medical History: Negative for: Anorexia/bulimia; Confinement Anxiety Eyes Medical History: Positive for: Cataracts - surgery 2000; Glaucoma Endocrine Medical History: Negative for: Type I Diabetes; Type II Diabetes Genitourinary Medical History: Negative for: End Stage Renal  Disease Mccain, Torunn H. ([id number]) Immunological Medical History: Negative for: Lupus Erythematosus; Raynaudos; Scleroderma Integumentary (Skin) Medical History: Negative for: History of Burn; History of pressure wounds Musculoskeletal Medical History: Negative for: Gout; Rheumatoid Arthritis; Osteoarthritis; Osteomyelitis Neurologic Medical History: Negative for: Dementia; Neuropathy; Quadriplegia; Paraplegia; Seizure Disorder Oncologic Medical History: Negative for: Received Chemotherapy; Received Radiation HBO Extended History Items Eyes: Eyes: Cataracts Glaucoma Immunizations Pneumococcal Vaccine: Received Pneumococcal Vaccination: Yes Implantable Devices None  Hospitalization / Surgery History Type of Hospitalization/Surgery Colon cancer Family and Social History Cancer: Yes - Siblings,Paternal Grandparents; Diabetes: Yes - Siblings; Heart Disease: Yes - Mother,Father,Child,Paternal Grandparents; Hereditary Spherocytosis: No; Hypertension: Yes - Child; Kidney Disease: No; Lung Disease: No; Seizures: No; Stroke: No; Thyroid Problems: No; Tuberculosis: No; Former smoker - quit 40 yrs ago; Marital Status - Divorced; Alcohol Use: Never; Drug Use: No History; Caffeine Use: Rarely; Financial Concerns: No; Food, Clothing or Shelter Needs: No; Support System Lacking: No; Transportation Concerns: No Physician Affirmation I have reviewed and agree with the above information. Electronic Signature(s) Signed: 03/24/2019 4:59:24 PM By: Montey Hora Signed: 03/24/2019 6:14:16 PM By: Worthy Keeler PA-C Entered By: Worthy Keeler on 03/24/2019 15:20:58 Cappelletti, Mayeli Lemmie Evens ([id number]) -------------------------------------------------------------------------------- SuperBill Details Patient Name: [name] Date of Service: 03/24/2019 Medical Record Number: [id number] Patient Account Number: [id number] Date of Birth/Sex: 1928/05/04 (85 y.o. F) Treating RN: Montey Hora Primary Care Provider: Park Liter Other Clinician: Referring Provider: Park Liter Treating Provider/Extender: [name], [name] Weeks in Treatment: 2 Diagnosis Coding ICD-10 Codes Code Description I87.2 Venous insufficiency (chronic) (peripheral) L97.812 Non-pressure chronic ulcer of other part of right lower leg with fat layer exposed Facility Procedures CPT4 Code Description: 16384665 11042 - DEB SUBQ TISSUE 20 SQ CM/< ICD-10 Diagnosis Description L93.570 Non-pressure chronic ulcer of other part of right lower leg wit Modifier: h fat layer expo Quantity: 1 sed Physician Procedures CPT4 Code Description: [id number] 110[address] - WC PHYS SUBQ TISS 20 SQ CM ICD-10 Diagnosis Description Z00.923 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

## 2019-03-25 NOTE — Progress Notes (Signed)
FREDRICKA, KOHRS ([id number]) Visit Report for 03/24/2019 Arrival Information Details Patient Name: [name], [name]. Date of Service: 03/24/2019 2:45 PM Medical Record Number: [id number] Patient Account Number: [id number] Date of Birth/Sex: 11-23-1927 (85 y.o. F) Treating RN: [name] Barman Primary Care Verdine Grenfell: Park Liter Other Clinician: Referring Eural Holzschuh: Park Liter Treating Teckla Christiansen/Extender: [name], [name] Weeks in Treatment: 2 Visit Information History Since Last Visit Added or deleted any medications: No Patient Arrived: Ambulatory Any new allergies or adverse reactions: No Arrival Time: 14:46 Had a fall or experienced change in No Accompanied By: self activities of daily living that may affect Transfer Assistance: None risk of falls: Patient Identification Verified: Yes Signs or symptoms of abuse/neglect since last visito No Secondary Verification Process Completed: Yes Hospitalized since last visit: No Implantable device outside of the clinic excluding No cellular tissue based products placed in the center since last visit: Has Dressing in Place as Prescribed: Yes Pain Present Now: No Electronic Signature(s) Signed: 03/24/2019 5:26:03 PM By: Gretta Cool, BSN, RN, CWS, Kim RN, BSN Entered By: Gretta Cool, BSN, RN, CWS, Kim on 03/24/2019 14:47:14 Spratlin, [name] ([id number]) -------------------------------------------------------------------------------- Lower Extremity Assessment Details Patient Name: [name], [name] Date of Service: 03/24/2019 2:45 PM Medical Record Number: [id number] Patient Account Number: [id number] Date of Birth/Sex: 1928/04/19 (85 y.o. F) Treating RN: [name] Barman Primary Care Salaya Holtrop: Park Liter Other Clinician: Referring Teghan Philbin: Park Liter Treating Laurey Salser/Extender: [name], [name] Weeks in Treatment: 2 Edema Assessment Assessed: [Left: No] [Right: No] Edema: [Left: N] [Right: o] Vascular Assessment Pulses: Dorsalis  Pedis Palpable: [Left:Yes] Electronic Signature(s) Signed: 03/24/2019 5:26:03 PM By: Gretta Cool, BSN, RN, CWS, Kim RN, BSN Entered By: Gretta Cool, BSN, RN, CWS, Kim on 03/24/2019 15:00:16 AMILAH, GREENSPAN ([id number]) -------------------------------------------------------------------------------- Multi Wound Chart Details Patient Name: Carol Games. Date of Service: 03/24/2019 2:45 PM Medical Record Number: [id number] Patient Account Number: [id number] Date of Birth/Sex: Mar 16, 1928 (85 y.o. F) Treating RN: Montey Hora Primary Care Amariyon Maynes: Park Liter Other Clinician: Referring Keinan Brouillet: Park Liter Treating Shamila Lerch/Extender: [name], [name] Weeks in Treatment: 2 Vital Signs Height(in): 60 Pulse(bpm): 42 Weight(lbs): 2 Blood Pressure(mmHg): 118/62 Body Mass Index(BMI): 18 Temperature(F): 98.2 Respiratory Rate 16 (breaths/min): Photos: Wound Location: Left Forearm - Midline Left Lower Leg - Midline Right Lower Leg - Medial Wounding Event: Trauma Trauma Trauma Primary Etiology: Trauma, Other Trauma, Other Trauma, Other Comorbid History: Cataracts, Glaucoma, Asthma, Cataracts, Glaucoma, Asthma, Cataracts, Glaucoma, Asthma, Chronic Obstructive Chronic Obstructive Chronic Obstructive Pulmonary Disease (COPD), Pulmonary Disease (COPD), Pulmonary Disease (COPD), Arrhythmia, Hypertension Arrhythmia, Hypertension Arrhythmia, Hypertension Date Acquired: 03/20/2019 03/21/2019 03/05/2019 Weeks of Treatment: 0 0 2 Wound Status: Open Open Open Measurements L x W x D 0.3x1x0.1 1x1x0.1 0.1x0.1x0.1 (cm) Area (cm) : 0.236 0.785 0.008 Volume (cm) : 0.024 0.079 0.001 % Reduction in Area: 0.00% N/A 98.30% % Reduction in Volume: 0.00% N/A 97.90% Classification: Partial Thickness Unclassifiable Full Thickness Without Exposed Support Structures Exudate Amount: None Present None Present None Present Exudate Type: N/A N/A N/A Exudate Color: N/A N/A N/A Wound Margin: Flat and Intact  Indistinct, nonvisible Flat and Intact Granulation Amount: None Present (0%) N/A None Present (0%) Granulation Quality: N/A N/A N/A Necrotic Amount: Large (67-100%) N/A Small (1-33%) Necrotic Tissue: Eschar N/A Eschar Exposed Structures: Fascia: No Fascia: No Fat Layer (Subcutaneous Fat Layer (Subcutaneous Fat Layer (Subcutaneous Tissue) Exposed: Yes Tissue) Exposed: No Tissue) Exposed: No Fascia: No [name], [name] ([id number]) Tendon: No Tendon: No Tendon: No Muscle: No Muscle: No Muscle: No Joint: No Joint: No Joint: No Bone: No Bone:  No Bone: No Epithelialization: N/A N/A Large (67-100%) Wound Number: 8 N/A N/A Photos: N/A N/A Wound Location: Right Lower Leg - Anterior N/A N/A Wounding Event: Trauma N/A N/A Primary Etiology: Trauma, Other N/A N/A Comorbid History: Cataracts, Glaucoma, Asthma, N/A N/A Chronic Obstructive Pulmonary Disease (COPD), Arrhythmia, Hypertension Date Acquired: 03/05/2019 N/A N/A Weeks of Treatment: 2 N/A N/A Wound Status: Open N/A N/A Measurements L x W x D 1.2x1.5x0.1 N/A N/A (cm) Area (cm) : 1.414 N/A N/A Volume (cm) : 0.141 N/A N/A % Reduction in Area: 25.60% N/A N/A % Reduction in Volume: 25.80% N/A N/A Classification: Full Thickness Without N/A N/A Exposed Support Structures Exudate Amount: Medium N/A N/A Exudate Type: Serosanguineous N/A N/A Exudate Color: red, brown N/A N/A Wound Margin: Flat and Intact N/A N/A Granulation Amount: Large (67-100%) N/A N/A Granulation Quality: Red, Hyper-granulation N/A N/A Necrotic Amount: Small (1-33%) N/A N/A Necrotic Tissue: Eschar N/A N/A Exposed Structures: Fat Layer (Subcutaneous N/A N/A Tissue) Exposed: Yes Fascia: No Tendon: No Muscle: No Joint: No Bone: No Epithelialization: Large (67-100%) N/A N/A Treatment Notes Electronic Signature(s) Signed: 03/24/2019 4:59:24 PM By: Montey Hora Entered By: Montey Hora on 03/24/2019 15:09:52 [name], [name]  ([id number]) [name], [name] ([id number]) -------------------------------------------------------------------------------- Multi-Disciplinary Care Plan Details Patient Name: Carol Games. Date of Service: 03/24/2019 2:45 PM Medical Record Number: [id number] Patient Account Number: [id number] Date of Birth/Sex: 06/17/1928 (85 y.o. F) Treating RN: Montey Hora Primary Care Jalexis Breed: Park Liter Other Clinician: Referring Sondra Blixt: Park Liter Treating Merlin Golden/Extender: [name], [name] Weeks in Treatment: 2 Active Inactive Abuse / Safety / Falls / Self Care Management Nursing Diagnoses: Potential for falls Goals: Patient will not experience any injury related to falls Date Initiated: 03/10/2019 Target Resolution Date: 06/13/2019 Goal Status: Active Interventions: Assess fall risk on admission and as needed Notes: Necrotic Tissue Nursing Diagnoses: Impaired tissue integrity related to necrotic/devitalized tissue Goals: Patient/caregiver will verbalize understanding of reason and process for debridement of necrotic tissue Date Initiated: 03/10/2019 Target Resolution Date: 06/13/2019 Goal Status: Active Interventions: Provide education on necrotic tissue and debridement process Notes: Orientation to the Wound Care Program Nursing Diagnoses: Knowledge deficit related to the wound healing center program Goals: Patient/caregiver will verbalize understanding of the [place] Date Initiated: 03/10/2019 Target Resolution Date: 06/13/2019 Goal Status: Active Interventions: Provide education on orientation to the wound center [place], Brylie H. ([id number]) Notes: Wound/Skin Impairment Nursing Diagnoses: Impaired tissue integrity Goals: Ulcer/skin breakdown will heal within 14 weeks Date Initiated: 03/10/2019 Target Resolution Date: 06/13/2019 Goal Status: Active Interventions: Assess patient/caregiver ability to obtain necessary  supplies Assess patient/caregiver ability to perform ulcer/skin care regimen upon admission and as needed Assess ulceration(s) every visit Notes: Electronic Signature(s) Signed: 03/24/2019 4:59:24 PM By: Montey Hora Entered By: Montey Hora on 03/24/2019 15:09:43 [name], [name] ([id number]) -------------------------------------------------------------------------------- Pain Assessment Details Patient Name: Carol Games. Date of Service: 03/24/2019 2:45 PM Medical Record Number: [id number] Patient Account Number: [id number] Date of Birth/Sex: 1927/10/16 (85 y.o. F) Treating RN: [name] Barman Primary Care Skylan Gift: Park Liter Other Clinician: Referring Natosha Bou: Park Liter Treating Lavon Horn/Extender: [name], [name] Weeks in Treatment: 2 Active Problems Location of Pain Severity and Description of Pain Patient Has Paino No Site Locations Pain Management and Medication Current Pain Management: Notes Patient denies pain at this time. Electronic Signature(s) Signed: 03/24/2019 5:26:03 PM By: Gretta Cool, BSN, RN, CWS, Kim RN, BSN Entered By: Gretta Cool, BSN, RN, CWS, Kim on 03/24/2019 14:47:31 [name], [name] ([id number]) -------------------------------------------------------------------------------- Patient/Caregiver Education Details Patient Name: Carol Games.  Date of Service: 03/24/2019 2:45 PM Medical Record Number: [id number] Patient Account Number: [id number] Date of Birth/Gender: [birth year] (85 y.o. F) Treating RN: Montey Hora Primary Care Physician: Park Liter Other Clinician: Referring Physician: Park Liter Treating Physician/Extender: Sharalyn Ink in Treatment: 2 Education Assessment Education Provided To: Patient Education Topics Provided Wound/Skin Impairment: Handouts: Other: wound care as ordered Methods: Demonstration, Explain/Verbal Responses: State content correctly Electronic Signature(s) Signed: 03/24/2019 4:59:24  PM By: Montey Hora Entered By: Montey Hora on 03/24/2019 15:24:51 [name], [name] ([id number]) -------------------------------------------------------------------------------- Wound Assessment Details Patient Name: [name], [name] Date of Service: 03/24/2019 2:45 PM Medical Record Number: [id number] Patient Account Number: [id number] Date of Birth/Sex: [birth year] (85 y.o. F) Treating RN: [name] Barman Primary Care Lada Fulbright: Park Liter Other Clinician: Referring Kristee Angus: Park Liter Treating Leanthony Rhett/Extender: [name], [name] Weeks in Treatment: 2 Wound Status Wound Number: 12 Primary Trauma, Other Etiology: Wound Location: Left Forearm - Midline Wound Open Wounding Event: Trauma Status: Date Acquired: 03/20/2019 Comorbid Cataracts, Glaucoma, Asthma, Chronic Weeks Of Treatment: 0 History: Obstructive Pulmonary Disease (COPD), Clustered Wound: No Arrhythmia, Hypertension Photos Wound Measurements Length: (cm) 0.3 % Reduction Width: (cm) 1 % Reduction Depth: (cm) 0.1 Tunneling: Area: (cm) 0.236 Underminin Volume: (cm) 0.024 in Area: 0% in Volume: 0% No g: No Wound Description Classification: Partial Thickness Foul Odor A Wound Margin: Flat and Intact Slough/Fibr Exudate Amount: None Present fter Cleansing: No ino No Wound Bed Granulation Amount: None Present (0%) Exposed Structure Necrotic Amount: Large (67-100%) Fascia Exposed: No Necrotic Quality: Eschar Fat Layer (Subcutaneous Tissue) Exposed: No Tendon Exposed: No Muscle Exposed: No Joint Exposed: No Bone Exposed: No Treatment Notes Wound #12 (Left, Midline Forearm) [place], Eldoris H. ([id number]) Notes xeroform, non adherent pad and conform Electronic Signature(s) Signed: 03/24/2019 5:26:03 PM By: Gretta Cool, BSN, RN, CWS, Kim RN, BSN Entered By: Gretta Cool, BSN, RN, CWS, Kim on 03/24/2019 14:58:37 Krikorian, [name]  ([id number]) -------------------------------------------------------------------------------- Wound Assessment Details Patient Name: [name], [name] Date of Service: 03/24/2019 2:45 PM Medical Record Number: [id number] Patient Account Number: [id number] Date of Birth/Sex: [birth year] (85 y.o. F) Treating RN: [name] Barman Primary Care Soriya Worster: Park Liter Other Clinician: Referring Shaterica Mcclatchy: Park Liter Treating Audwin Semper/Extender: [name], [name] Weeks in Treatment: 2 Wound Status Wound Number: 13 Primary Trauma, Other Etiology: Wound Location: Left Lower Leg - Midline Wound Open Wounding Event: Trauma Status: Date Acquired: 03/21/2019 Comorbid Cataracts, Glaucoma, Asthma, Chronic Weeks Of Treatment: 0 History: Obstructive Pulmonary Disease (COPD), Clustered Wound: No Arrhythmia, Hypertension Photos Wound Measurements Length: (cm) 1 % Reducti Width: (cm) 1 % Reducti Depth: (cm) 0.1 Tunneling Area: (cm) 0.785 Undermin Volume: (cm) 0.079 on in Area: on in Volume: : No ing: No Wound Description Classification: Unclassifiable Wound Margin: Indistinct, nonvisible Exudate Amount: None Present Foul Odor After Cleansing: No Slough/Fibrino No Wound Bed Exposed Structure Fascia Exposed: No Fat Layer (Subcutaneous Tissue) Exposed: No Tendon Exposed: No Muscle Exposed: No Joint Exposed: No Bone Exposed: No Electronic Signature(s) Signed: 03/24/2019 5:26:03 PM By: Gretta Cool, BSN, RN, CWS, Kim RN, BSN [place], [place] ([id number]) Entered By: Gretta Cool, BSN, RN, CWS, Kim on 03/24/2019 14:57:08 Bhakta, [name] ([id number]) -------------------------------------------------------------------------------- Wound Assessment Details Patient Name: [name], [name] Date of Service: 03/24/2019 2:45 PM Medical Record Number: [id number] Patient Account Number: [id number] Date of Birth/Sex: Jan 17, 1928 (85 y.o. F) Treating RN: [name] Barman Primary Care Nimah Uphoff: Park Liter  Other Clinician: Referring Kameren Baade: Park Liter Treating Danny Zimny/Extender: [name], [name] Weeks in Treatment: 2 Wound Status Wound Number: 7 Primary Trauma, Other Etiology: Wound  Location: Right Lower Leg - Medial Wound Open Wounding Event: Trauma Status: Date Acquired: 03/05/2019 Comorbid Cataracts, Glaucoma, Asthma, Chronic Weeks Of Treatment: 2 History: Obstructive Pulmonary Disease (COPD), Clustered Wound: No Arrhythmia, Hypertension Photos Wound Measurements Length: (cm) 0.1 Width: (cm) 0.1 Depth: (cm) 0.1 Area: (cm) 0.008 Volume: (cm) 0.001 % Reduction in Area: 98.3% % Reduction in Volume: 97.9% Epithelialization: Large (67-100%) Tunneling: No Undermining: No Wound Description Full Thickness Without Exposed Support Foul Odor Classification: Structures Slough/Fi Wound Margin: Flat and Intact Exudate None Present Amount: After Cleansing: No brino Yes Wound Bed Granulation Amount: None Present (0%) Exposed Structure Necrotic Amount: Small (1-33%) Fascia Exposed: No Necrotic Quality: Eschar Fat Layer (Subcutaneous Tissue) Exposed: Yes Tendon Exposed: No Muscle Exposed: No Joint Exposed: No Bone Exposed: No Treatment Notes [name], [name] ([id number]) Wound #7 (Right, Medial Lower Leg) Notes xeroform, non adherent pad and conform Electronic Signature(s) Signed: 03/24/2019 5:26:03 PM By: Gretta Cool, BSN, RN, CWS, Kim RN, BSN Entered By: Gretta Cool, BSN, RN, CWS, Kim on 03/24/2019 14:59:20 [place], [name] ([id number]) -------------------------------------------------------------------------------- Wound Assessment Details Patient Name: [name], Coline H. Date of Service: 03/24/2019 2:45 PM Medical Record Number: [id number] Patient Account Number: [id number] Date of Birth/Sex: 02-16-1928 (85 y.o. F) Treating RN: [name] Barman Primary Care Pauleen Goleman: Park Liter Other Clinician: Referring Brighten Orndoff: Park Liter Treating Oluwadarasimi Favor/Extender: [name], [name] Weeks in Treatment: 2 Wound Status Wound Number: 8 Primary Trauma, Other Etiology: Wound Location: Right Lower Leg - Anterior Wound Open Wounding Event: Trauma Status: Date Acquired: 03/05/2019 Comorbid Cataracts, Glaucoma, Asthma, Chronic Weeks Of Treatment: 2 History: Obstructive Pulmonary Disease (COPD), Clustered Wound: No Arrhythmia, Hypertension Photos Wound Measurements Length: (cm) 1.2 Width: (cm) 1.5 Depth: (cm) 0.1 Area: (cm) 1.414 Volume: (cm) 0.141 % Reduction in Area: 25.6% % Reduction in Volume: 25.8% Epithelialization: Large (67-100%) Tunneling: No Undermining: No Wound Description Full Thickness Without Exposed Support Foul Odo Classification: Structures Slough/F Wound Margin: Flat and Intact Exudate Medium Amount: Exudate Type: Serosanguineous Exudate Color: red, brown r After Cleansing: No ibrino Yes Wound Bed Granulation Amount: Large (67-100%) Exposed Structure Granulation Quality: Red, Hyper-granulation Fascia Exposed: No Necrotic Amount: Small (1-33%) Fat Layer (Subcutaneous Tissue) Exposed: Yes Necrotic Quality: Eschar Tendon Exposed: No Muscle Exposed: No Joint Exposed: No Bone Exposed: No [place], Krisy H. ([id number]) Treatment Notes Wound #8 (Right, Anterior Lower Leg) Notes xeroform, non adherent pad and conform Electronic Signature(s) Signed: 03/24/2019 5:26:03 PM By: Gretta Cool, BSN, RN, CWS, Kim RN, BSN Entered By: Gretta Cool, BSN, RN, CWS, Kim on 03/24/2019 14:59:56 [place], [name] ([id number]) -------------------------------------------------------------------------------- Vitals Details Patient Name: Carol Games. Date of Service: 03/24/2019 2:45 PM Medical Record Number: [id number] Patient Account Number: [id number] Date of Birth/Sex: 1928/03/14 (85 y.o. F) Treating RN: [name] Barman Primary Care Jaedah Lords: Park Liter Other Clinician: Referring Karielle Davidow: Park Liter Treating Cheryel Kyte/Extender: [name], [name] Weeks in Treatment: 2 Vital Signs Time Taken: 14:48 Temperature (F): 98.2 Height (in): 60 Pulse (bpm): 70 Weight (lbs): 92 Respiratory Rate (breaths/min): 16 Body Mass Index (BMI): 18 Blood Pressure (mmHg): 118/62 Reference Range: 80 - 120 mg / dl Electronic Signature(s) Signed: 03/24/2019 5:26:03 PM By: Gretta Cool, BSN, RN, CWS, Kim RN, BSN Entered By: Gretta Cool, BSN, RN, CWS, Kim on 03/24/2019 14:50:15

## 2019-03-31 ENCOUNTER — Other Ambulatory Visit: Payer: Self-pay

## 2019-03-31 ENCOUNTER — Encounter: Payer: Medicare Other | Admitting: Physician Assistant

## 2019-03-31 DIAGNOSIS — L97812 Non-pressure chronic ulcer of other part of right lower leg with fat layer exposed: Secondary | ICD-10-CM | POA: Diagnosis not present

## 2019-03-31 DIAGNOSIS — I872 Venous insufficiency (chronic) (peripheral): Secondary | ICD-10-CM | POA: Diagnosis not present

## 2019-03-31 DIAGNOSIS — J449 Chronic obstructive pulmonary disease, unspecified: Secondary | ICD-10-CM | POA: Diagnosis not present

## 2019-03-31 DIAGNOSIS — I4891 Unspecified atrial fibrillation: Secondary | ICD-10-CM | POA: Diagnosis not present

## 2019-03-31 DIAGNOSIS — I10 Essential (primary) hypertension: Secondary | ICD-10-CM | POA: Diagnosis not present

## 2019-03-31 DIAGNOSIS — Z8249 Family history of ischemic heart disease and other diseases of the circulatory system: Secondary | ICD-10-CM | POA: Diagnosis not present

## 2019-03-31 DIAGNOSIS — H409 Unspecified glaucoma: Secondary | ICD-10-CM | POA: Diagnosis not present

## 2019-03-31 DIAGNOSIS — Z87891 Personal history of nicotine dependence: Secondary | ICD-10-CM | POA: Diagnosis not present

## 2019-03-31 NOTE — Progress Notes (Addendum)
PERMELIA, BAMBA ([id number]) Visit Report for 03/31/2019 Chief Complaint Document Details Patient Name: [name], [name]. Date of Service: 03/31/2019 3:15 PM Medical Record Number: [id number] Patient Account Number: [id number] Date of Birth/Sex: March 20, 1928 (85 y.o. F) Treating RN: Montey Hora Primary Care Provider: Park Liter Other Clinician: Referring Provider: Park Liter Treating Provider/Extender: [name], [name] Weeks in Treatment: 3 Information Obtained from: Patient Chief Complaint Right leg ulcers Electronic Signature(s) Signed: 03/31/2019 2:58:45 PM By: Worthy Keeler PA-C Entered By: Worthy Keeler on 03/31/2019 14:58:44 [name], [name] Evens ([id number]) -------------------------------------------------------------------------------- Debridement Details Patient Name: [name]. Date of Service: 03/31/2019 3:15 PM Medical Record Number: [id number] Patient Account Number: [id number] Date of Birth/Sex: [birth year] (85 y.o. F) Treating RN: Montey Hora Primary Care Provider: Park Liter Other Clinician: Referring Provider: Park Liter Treating Provider/Extender: [name], [name] Weeks in Treatment: 3 Debridement Performed for Wound #8 Right,Anterior Lower Leg Assessment: Performed By: Physician STONE III, [name] E., PA-C Debridement Type: Debridement Level of Consciousness (Pre- Awake and Alert procedure): Pre-procedure Verification/Time Yes - 15:48 Out Taken: Start Time: 15:48 Pain Control: Lidocaine 4% Topical Solution Total Area Debrided (L x W): 1.5 (cm) x 1.6 (cm) = 2.4 (cm) Tissue and other material Viable, Non-Viable, Slough, Subcutaneous, Skin: Dermis , Skin: Epidermis, Slough debrided: Level: Skin/Subcutaneous Tissue Debridement Description: Excisional Instrument: Curette Bleeding: Minimum Hemostasis Achieved: Pressure End Time: 15:51 Procedural Pain: 0 Post Procedural Pain: 0 Response to Treatment: Procedure was  tolerated well Level of Consciousness Awake and Alert (Post-procedure): Post Debridement Measurements of Total Wound Length: (cm) 1.5 Width: (cm) 1.6 Depth: (cm) 0.2 Volume: (cm) 0.377 Character of Wound/Ulcer Post Debridement: Improved Post Procedure Diagnosis Same as Pre-procedure Electronic Signature(s) Signed: 03/31/2019 4:49:07 PM By: Montey Hora Signed: 04/01/2019 9:47:59 AM By: Worthy Keeler PA-C Entered By: Montey Hora on 03/31/2019 15:51:08 Wise, [name] ([id number]) -------------------------------------------------------------------------------- HPI Details Patient Name: Carol Berger H. Date of Service: 03/31/2019 3:15 PM Medical Record Number: [id number] Patient Account Number: [id number] Date of Birth/Sex: 03/31/1928 (85 y.o. F) Treating RN: Montey Hora Primary Care Provider: Park Liter Other Clinician: Referring Provider: Park Liter Treating Provider/Extender: [name], [name] Weeks in Treatment: 3 History of Present Illness HPI Description: 02/12/18 ADMISSION This is an 85 year old woman who is recently moved to [place] from [place]. Her story began in February where she fell on some ice suffering extensive lacerations to her bilateral lower extremities. She is able to show me extensive pictures of the right lower leg but with going to a wound care center and [place] 3 times a week these eventually closed over. She has been left with 1 area on the upper lateral left calf. She has been applying Neosporin to this and applying a bandage. Currently this measures 2 x 1.5 cm. The patient is not a diabetic. She is an ex-smoker quitting 40 years ago. She does have COPD by description. ABIs in our clinic were 1.32 on the right and 1.03 on the left 02/19/18; right lower leg wound which was initially trauma. The area that we look that last week is smaller. Still covered in a nonviable surface however. We are using Iodoflex 02/26/18; right lower  leg wound which was initially trauma in the setting of chronic venous insufficiency. Surface of the wound looks much better healthy granulation advancing epithelialization. We have good edema control 03/05/18; right lower leg wound which was initially trauma in the setting of chronic venous insufficiency. She continues to make nice progress here. We have good edema control oHe arrives  today with a new traumatic laceration on her right dorsal forearm which she states happened while she was putting on a sweatshirt 03/12/18; right lower leg wound as closed however it still looks vulnerable. She has chronic venous insufficiency. oThe new wound from last week a traumatic area on her right dorsal or arm unfortunately does not have a viable surface. I remove some nonviable skin and necrotic subcutaneous debris from the wound surface. Hemostasis with silver nitrate and direct pressure 03/19/18; right lower leg wound is closed. She has chronic venous insufficiency and will need ongoing compression oThe new wounds from 2 weeks ago on the right upper elbow is closed. The area on her right dorsal forearm continues to have a nonviable surface requiring debridement 03/26/18; we'll need to look into ongoing compression for her legs. 2 wounds on the right upper elbow is closed. The area on the right dorsal forearm looks better. Hydrofera Blue secondary to hypertrophic granulation 04/02/18; he has compression stockings although she did not wear them today. Severe chronic venous insufficiency. She has 2 wounds on the right upper elbow which I said were closed last week which actually are although they're very small and she has a smaller clean wound on the right dorsal forearm. We've been using Hydrofera Blue secondary to some upper granulation. All of this looks better 04/09/18-She is seen in follow up evaluation for a right elbow and right forearm skin tear. The right elbow is healed. We will continue with same treatment  plan and she'll follow next week 04/16/18-She is seen in follow-up evaluation for right forearm skin tear, this is essentially healed. We will cover with foam border and she will follow-up next week 04/23/18; the patient arrives with a new skin tear on her right dorsal arm just below the elbow. She got this while carrying a box. She has a linear skin tear. There is no depth I don't think this should've been sutured. There is a skin flap medially I'm not sure if this will be viable or not 04/30/18; the new skin tear from last week unfortunately has remained viable in terms of the skin flap. She has a small open area that looks healthy. Using moistened silver collagen 05/07/18; right dorsal arm skin tear. Nonviable tissue over the remnants of the wound. We've been using silver collagen 05/21/18; right dorsal arm skin tear. Nonviable tissue over a small remnant of the wound was washed off. We've been using silver collagen which we will continue 06/04/18; the right dorsal arm skin tear has healed. She arrives today with a traumatic wound just above the olecranon of her left elbow. This is a skin tear with a nonviable nonadherent flap which was removed. We'll use silver collagen here. Also noteworthy she arrives without her compression stockings 06/18/18; the area just above the olecranon of her left elbow. It is smaller and generally has a healthy surface. I was surprised JALAYNA, JOSTEN. ([id number]) to learn that the patient is actually changing this herself although. Appears this week she was putting some topical lidocaine on this that she bought over-the-counter at the drugstore. She did get supplies at home she didn't know how to put it on. She drove herself here today i.e. not eligible for home health Readmission: 11/04/18 on evaluation today patient presents for evaluation our clinic news to include the she has on her lower extremity which occurred as the result of having hit this on a piece of  furniture. This was back in December. She states that her  daughter has been trying to get her to come into have this evaluated here the wound care center but she was being somewhat stubborn. Nonetheless upon evaluation today the wound really appears to be potentially healed she does have some swelling of lower extremity which I think will come into play. With that being said he tells me that even yesterday this was still making clear fluid and therefore there may be some issues with continued problems with the swelling and drainage secondary to her venous stasis. Nonetheless overall wound appears to be doing fairly well which is good news. 11/11/18 on evaluation today patient actually appears to be doing excellent in the area of question that was green just the day before he saw her last time actually appears to have not drain that always in past week. I think this is done very well and at this point though she's had some discomfort I do not see any evidence of open wound which he needs to continue see wound care. Readmission: 03/10/19 on evaluation today patient presents for reevaluation here in our clinic concerning issues that she actually is having with the new problem on her right lower leg due to trauma that occurred when a car door shut on her leg last Thursday. This is not been quite a week and she does have several skin tears unfortunately that resulted from this injury. Fortunately there's no signs of active infection at this time. No fevers, chills, nausea, or vomiting noted at this time. She does have some hot tissue noted at this point that has me concerned about the possibility of needing to move this in order to allow the areas to heal appropriately. Fortunately there's no signs of active infection at this time. 03/17/19-Patient returns to clinic at 1 week, we have been using Prisma on the larger wounds with Kerlix and Xeroform. As many as 5 wounds were number last time 3 of which have  closed, leaving the right proximal tibial and right distal medial leg wound both of which are looking good 03/24/19 on evaluation today patient appears to be doing better in regard to her wounds at this time. Fortunately there is no sign of active infection at this time. No fevers chills noted. She's been tolerating the dressing changes without complication which is good news she's been performing these herself it sounds like home health is not going to be coming out it sounds as if they feel like she may be able to take care of yourself and not be technically homebound. Nonetheless though that is unfortunate it does seem like she's done fairly well with taking care of yourself from the standpoint of the dressings. 03/31/19 on evaluation today patient actually appears to be doing well with regard to her lower extremity ulcers. She had one area to heal today and in regard to her right lower extremity were she continues to have an open wound I did have to perform some sharp debridement today. Subsequently the patient still has an area of what appears to be possibly a small hematoma on the anterior portion of her left lower extremity. This is not open enough at this time but nonetheless does seem to show some evidence of hardening which is not necessarily a bad thing. Eventually this may pop off but again I'm not interested in really removing this prematurely. Electronic Signature(s) Signed: 04/01/2019 9:47:59 AM By: Worthy Keeler PA-C Entered By: Worthy Keeler on 04/01/2019 03:24:06 [name], [name] ([id number]) -------------------------------------------------------------------------------- Physical Exam Details Patient Name:  [name], [name] Date of Service: 03/31/2019 3:15 PM Medical Record Number: [id number] Patient Account Number: [id number] Date of Birth/Sex: [birth year] (85 y.o. F) Treating RN: Montey Hora Primary Care Provider: Park Liter Other Clinician: Referring  Provider: Park Liter Treating Provider/Extender: [name], [name] Weeks in Treatment: 3 Constitutional Well-nourished and well-hydrated in no acute distress. Respiratory normal breathing without difficulty. Psychiatric this patient is able to make decisions and demonstrates good insight into disease process. Alert and Oriented x 3. pleasant and cooperative. Notes Patient's right lower extremity did require some sharp debridement today which was performed without complication post debridement wound bed appears to be doing much better. Electronic Signature(s) Signed: 04/01/2019 9:47:59 AM By: Worthy Keeler PA-C Entered By: Worthy Keeler on 04/01/2019 03:24:44 [name], [name] ([id number]) -------------------------------------------------------------------------------- Physician Orders Details Patient Name: [name]. Date of Service: 03/31/2019 3:15 PM Medical Record Number: [id number] Patient Account Number: [id number] Date of Birth/Sex: 16-Oct-1927 (85 y.o. F) Treating RN: Montey Hora Primary Care Provider: Park Liter Other Clinician: Referring Provider: Park Liter Treating Provider/Extender: [name], [name] Weeks in Treatment: 3 Verbal / Phone Orders: No Diagnosis Coding ICD-10 Coding Code Description I87.2 Venous insufficiency (chronic) (peripheral) L97.812 Non-pressure chronic ulcer of other part of right lower leg with fat layer exposed Wound Cleansing Wound #8 Right,Anterior Lower Leg o Clean wound with Normal Saline. o Dial antibacterial soap, wash wounds, rinse and pat dry prior to dressing wounds o May Shower, gently pat wound dry prior to applying new dressing. Anesthetic (add to Medication List) Wound #8 Right,Anterior Lower Leg o Topical Lidocaine 4% cream applied to wound bed prior to debridement (In Clinic Only). Primary Wound Dressing Wound #8 Right,Anterior Lower Leg o Hydrafera Blue Ready Transfer Secondary  Dressing Wound #8 Right,Anterior Lower Leg o ABD and Kerlix/Conform - secure with netting Dressing Change Frequency Wound #8 Right,Anterior Lower Leg o Change Dressing Monday, Wednesday, Friday Follow-up Appointments o Return Appointment in 1 week. Home Health Wound #13 [place] Visits - [place] Nurse may visit PRN to address patientos wound care needs. o FACE TO FACE ENCOUNTER: MEDICARE and MEDICAID PATIENTS: I certify that this patient is under my care and that I had a face-to-face encounter that meets the physician face-to-face encounter requirements with this patient on this date. The encounter with the patient was in whole or in part for the following MEDICAL CONDITION: (primary reason for [place]) MEDICAL NECESSITY: I certify, that based on my findings, NURSING services are a medically necessary home health service. HOME BOUND STATUS: I certify that my clinical findings support that this patient is homebound (i.e., Due to illness or injury, pt requires aid of supportive devices such as crutches, cane, wheelchairs, walkers, the use of special transportation or the assistance of another person to leave their place of residence. There is a normal inability to leave the home [place], [place] ([id number]) and doing so requires considerable and taxing effort. Other absences are for medical reasons / religious services and are infrequent or of short duration when for other reasons). o If current dressing causes regression in wound condition, may D/C ordered dressing product/s and apply Normal Saline Moist Dressing daily until next [place] / Other MD appointment. [place] of regression in wound condition at [phone]. o Please direct any NON-WOUND related issues/requests for orders to patient's Primary Care Physician Wound #8 [place]  Visits - [place]  Nurse may visit PRN to address patientos wound care needs. o FACE TO FACE ENCOUNTER: MEDICARE and MEDICAID PATIENTS: I certify that this patient is under my care and that I had a face-to-face encounter that meets the physician face-to-face encounter requirements with this patient on this date. The encounter with the patient was in whole or in part for the following MEDICAL CONDITION: (primary reason for [place]) MEDICAL NECESSITY: I certify, that based on my findings, NURSING services are a medically necessary home health service. HOME BOUND STATUS: I certify that my clinical findings support that this patient is homebound (i.e., Due to illness or injury, pt requires aid of supportive devices such as crutches, cane, wheelchairs, walkers, the use of special transportation or the assistance of another person to leave their place of residence. There is a normal inability to leave the home and doing so requires considerable and taxing effort. Other absences are for medical reasons / religious services and are infrequent or of short duration when for other reasons). o If current dressing causes regression in wound condition, may D/C ordered dressing product/s and apply Normal Saline Moist Dressing daily until next [place] / Other MD appointment. [place] of regression in wound condition at [phone]. o Please direct any NON-WOUND related issues/requests for orders to patient's Primary Care Physician Electronic Signature(s) Signed: 03/31/2019 4:49:07 PM By: Montey Hora Signed: 04/01/2019 9:47:59 AM By: Worthy Keeler PA-C Entered By: Montey Hora on 03/31/2019 15:59:05 [name], [name] ([id number]) -------------------------------------------------------------------------------- Problem List Details Patient Name: Cwynar, Jamesina H. Date of Service: 03/31/2019 3:15 PM Medical Record Number: [id number] Patient  Account Number: [id number] Date of Birth/Sex: 07/17/1928 (85 y.o. F) Treating RN: Montey Hora Primary Care Provider: Park Liter Other Clinician: Referring Provider: Park Liter Treating Provider/Extender: [name], [name] Weeks in Treatment: 3 Active Problems ICD-10 Evaluated Encounter Code Description Active Date Today Diagnosis I87.2 Venous insufficiency (chronic) (peripheral) 03/10/2019 No Yes L97.812 Non-pressure chronic ulcer of other part of right lower leg 03/10/2019 No Yes with fat layer exposed Inactive Problems Resolved Problems Electronic Signature(s) Signed: 03/31/2019 2:58:37 PM By: Worthy Keeler PA-C Entered By: Worthy Keeler on 03/31/2019 14:58:37 Parisi, Kerryn H. ([id number]) -------------------------------------------------------------------------------- Progress Note Details Patient Name: Gladden, Shylie H. Date of Service: 03/31/2019 3:15 PM Medical Record Number: [id number] Patient Account Number: [id number] Date of Birth/Sex: September 22, 1927 (85 y.o. F) Treating RN: Montey Hora Primary Care Provider: Park Liter Other Clinician: Referring Provider: Park Liter Treating Provider/Extender: [name], [name] Weeks in Treatment: 3 Subjective Chief Complaint Information obtained from Patient Right leg ulcers History of Present Illness (HPI) 02/12/18 ADMISSION This is an 85 year old woman who is recently moved to [place] from [place]. Her story began in February where she fell on some ice suffering extensive lacerations to her bilateral lower extremities. She is able to show me extensive pictures of the right lower leg but with going to a wound care center and [place] 3 times a week these eventually closed over. She has been left with 1 area on the upper lateral left calf. She has been applying Neosporin to this and applying a bandage. Currently this measures 2 x 1.5 cm. The patient is not a diabetic. She is an ex-smoker quitting 40 years ago.  She does have COPD by description. ABIs in our clinic were 1.32 on the right and 1.03 on the left 02/19/18; right lower leg wound which was initially trauma. The area that we look that last week is smaller. Still covered in  a nonviable surface however. We are using Iodoflex 02/26/18; right lower leg wound which was initially trauma in the setting of chronic venous insufficiency. Surface of the wound looks much better healthy granulation advancing epithelialization. We have good edema control 03/05/18; right lower leg wound which was initially trauma in the setting of chronic venous insufficiency. She continues to make nice progress here. We have good edema control He arrives today with a new traumatic laceration on her right dorsal forearm which she states happened while she was putting on a sweatshirt 03/12/18; right lower leg wound as closed however it still looks vulnerable. She has chronic venous insufficiency. The new wound from last week a traumatic area on her right dorsal or arm unfortunately does not have a viable surface. I remove some nonviable skin and necrotic subcutaneous debris from the wound surface. Hemostasis with silver nitrate and direct pressure 03/19/18; right lower leg wound is closed. She has chronic venous insufficiency and will need ongoing compression The new wounds from 2 weeks ago on the right upper elbow is closed. The area on her right dorsal forearm continues to have a nonviable surface requiring debridement 03/26/18; we'll need to look into ongoing compression for her legs. 2 wounds on the right upper elbow is closed. The area on the right dorsal forearm looks better. Hydrofera Blue secondary to hypertrophic granulation 04/02/18; he has compression stockings although she did not wear them today. Severe chronic venous insufficiency. She has 2 wounds on the right upper elbow which I said were closed last week which actually are although they're very small and she has a  smaller clean wound on the right dorsal forearm. We've been using Hydrofera Blue secondary to some upper granulation. All of this looks better 04/09/18-She is seen in follow up evaluation for a right elbow and right forearm skin tear. The right elbow is healed. We will continue with same treatment plan and she'll follow next week 04/16/18-She is seen in follow-up evaluation for right forearm skin tear, this is essentially healed. We will cover with foam border and she will follow-up next week 04/23/18; the patient arrives with a new skin tear on her right dorsal arm just below the elbow. She got this while carrying a box. She has a linear skin tear. There is no depth I don't think this should've been sutured. There is a skin flap medially I'm not sure if this will be viable or not 04/30/18; the new skin tear from last week unfortunately has remained viable in terms of the skin flap. She has a small open area that looks healthy. Using moistened silver collagen 05/07/18; right dorsal arm skin tear. Nonviable tissue over the remnants of the wound. We've been using silver collagen [name], [name] ([id number]) 05/21/18; right dorsal arm skin tear. Nonviable tissue over a small remnant of the wound was washed off. We've been using silver collagen which we will continue 06/04/18; the right dorsal arm skin tear has healed. She arrives today with a traumatic wound just above the olecranon of her left elbow. This is a skin tear with a nonviable nonadherent flap which was removed. We'll use silver collagen here. Also noteworthy she arrives without her compression stockings 06/18/18; the area just above the olecranon of her left elbow. It is smaller and generally has a healthy surface. I was surprised to learn that the patient is actually changing this herself although. Appears this week she was putting some topical lidocaine on this that she bought over-the-counter at the drugstore.  She did get supplies at home  she didn't know how to put it on. She drove herself here today i.e. not eligible for home health Readmission: 11/04/18 on evaluation today patient presents for evaluation our clinic news to include the she has on her lower extremity which occurred as the result of having hit this on a piece of furniture. This was back in December. She states that her daughter has been trying to get her to come into have this evaluated here the wound care center but she was being somewhat stubborn. Nonetheless upon evaluation today the wound really appears to be potentially healed she does have some swelling of lower extremity which I think will come into play. With that being said he tells me that even yesterday this was still making clear fluid and therefore there may be some issues with continued problems with the swelling and drainage secondary to her venous stasis. Nonetheless overall wound appears to be doing fairly well which is good news. 11/11/18 on evaluation today patient actually appears to be doing excellent in the area of question that was green just the day before he saw her last time actually appears to have not drain that always in past week. I think this is done very well and at this point though she's had some discomfort I do not see any evidence of open wound which he needs to continue see wound care. Readmission: 03/10/19 on evaluation today patient presents for reevaluation here in our clinic concerning issues that she actually is having with the new problem on her right lower leg due to trauma that occurred when a car door shut on her leg last Thursday. This is not been quite a week and she does have several skin tears unfortunately that resulted from this injury. Fortunately there's no signs of active infection at this time. No fevers, chills, nausea, or vomiting noted at this time. She does have some hot tissue noted at this point that has me concerned about the possibility of needing to move  this in order to allow the areas to heal appropriately. Fortunately there's no signs of active infection at this time. 03/17/19-Patient returns to clinic at 1 week, we have been using Prisma on the larger wounds with Kerlix and Xeroform. As many as 5 wounds were number last time 3 of which have closed, leaving the right proximal tibial and right distal medial leg wound both of which are looking good 03/24/19 on evaluation today patient appears to be doing better in regard to her wounds at this time. Fortunately there is no sign of active infection at this time. No fevers chills noted. She's been tolerating the dressing changes without complication which is good news she's been performing these herself it sounds like home health is not going to be coming out it sounds as if they feel like she may be able to take care of yourself and not be technically homebound. Nonetheless though that is unfortunate it does seem like she's done fairly well with taking care of yourself from the standpoint of the dressings. 03/31/19 on evaluation today patient actually appears to be doing well with regard to her lower extremity ulcers. She had one area to heal today and in regard to her right lower extremity were she continues to have an open wound I did have to perform some sharp debridement today. Subsequently the patient still has an area of what appears to be possibly a small hematoma on the anterior portion of her left lower  extremity. This is not open enough at this time but nonetheless does seem to show some evidence of hardening which is not necessarily a bad thing. Eventually this may pop off but again I'm not interested in really removing this prematurely. Patient History Information obtained from Patient. Family History Cancer - Siblings,Paternal Grandparents, Diabetes - Siblings, Heart Disease - Mother,Father,Child,Paternal Grandparents, Hypertension - Child, No family history of Hereditary Spherocytosis,  Kidney Disease, Lung Disease, Seizures, Stroke, Thyroid Problems, Tuberculosis. [name], [name] ([id number]) Social History Former smoker - quit 40 yrs ago, Marital Status - Divorced, Alcohol Use - Never, Drug Use - No History, Caffeine Use - Rarely. Medical History Eyes Patient has history of Cataracts - surgery 2000, Glaucoma Respiratory Patient has history of Asthma, Chronic Obstructive Pulmonary Disease (COPD) Denies history of Pneumothorax, Sleep Apnea, Tuberculosis Cardiovascular Patient has history of Arrhythmia - a-fib, Hypertension Denies history of Angina, Congestive Heart Failure, Coronary Artery Disease, Deep Vein Thrombosis, Hypotension, Myocardial Infarction, Peripheral Arterial Disease, Peripheral Venous Disease, Phlebitis, Vasculitis Endocrine Denies history of Type I Diabetes, Type II Diabetes Genitourinary Denies history of End Stage Renal Disease Immunological Denies history of Lupus Erythematosus, Raynaud s, Scleroderma Integumentary (Skin) Denies history of History of Burn, History of pressure wounds Musculoskeletal Denies history of Gout, Rheumatoid Arthritis, Osteoarthritis, Osteomyelitis Neurologic Denies history of Dementia, Neuropathy, Quadriplegia, Paraplegia, Seizure Disorder Oncologic Denies history of Received Chemotherapy, Received Radiation Psychiatric Denies history of Anorexia/bulimia, Confinement Anxiety Hospitalization/Surgery History - Colon cancer. Review of Systems (ROS) Constitutional Symptoms (General Health) Denies complaints or symptoms of Fatigue, Fever, Chills, Marked Weight Change. Respiratory Denies complaints or symptoms of Chronic or frequent coughs, Shortness of Breath. Cardiovascular Complains or has symptoms of LE edema. Denies complaints or symptoms of Chest pain. Psychiatric Denies complaints or symptoms of Anxiety, Claustrophobia. Objective Constitutional Well-nourished and well-hydrated in no acute  distress. Vitals Time Taken: 3:12 PM, Height: 60 in, Weight: 92 lbs, BMI: 18, Temperature: 98.6 F, Pulse: 70 bpm, Respiratory Rate: Mirabella, Clevie H. ([id number]) 16 breaths/min, Blood Pressure: 148/58 mmHg. Respiratory normal breathing without difficulty. Psychiatric this patient is able to make decisions and demonstrates good insight into disease process. Alert and Oriented x 3. pleasant and cooperative. General Notes: Patient's right lower extremity did require some sharp debridement today which was performed without complication post debridement wound bed appears to be doing much better. Integumentary (Hair, Skin) Wound #12 status is Healed - Epithelialized. Original cause of wound was Trauma. The wound is located on the Left,Midline Forearm. The wound measures 0cm length x 0cm width x 0cm depth; 0cm^2 area and 0cm^3 volume. There is no tunneling or undermining noted. There is a none present amount of drainage noted. The wound margin is flat and intact. There is no granulation within the wound bed. There is a large (67-100%) amount of necrotic tissue within the wound bed including Eschar. Wound #13 status is Open. Original cause of wound was Trauma. The wound is located on the Left,Midline Lower Leg. The wound measures 1cm length x 1cm width x 0.1cm depth; 0.785cm^2 area and 0.079cm^3 volume. There is no tunneling or undermining noted. There is a none present amount of drainage noted. The wound margin is indistinct and nonvisible. There is no granulation within the wound bed. There is a large (67-100%) amount of necrotic tissue within the wound bed including Eschar. Wound #7 status is Healed - Epithelialized. Original cause of wound was Trauma. The wound is located on the Right,Medial Lower Leg. The wound measures 0cm length x 0cm width  x 0cm depth; 0cm^2 area and 0cm^3 volume. There is Fat Layer (Subcutaneous Tissue) Exposed exposed. There is no tunneling or undermining noted. There  is a none present amount of drainage noted. The wound margin is flat and intact. There is no granulation within the wound bed. There is no necrotic tissue within the wound bed. Wound #8 status is Open. Original cause of wound was Trauma. The wound is located on the Right,Anterior Lower Leg. The wound measures 1.5cm length x 1.6cm width x 0.1cm depth; 1.885cm^2 area and 0.188cm^3 volume. There is Fat Layer (Subcutaneous Tissue) Exposed exposed. There is no tunneling or undermining noted. There is a medium amount of serosanguineous drainage noted. The wound margin is flat and intact. There is large (67-100%) red, hyper - granulation within the wound bed. There is a small (1-33%) amount of necrotic tissue within the wound bed including Eschar. Assessment Active Problems ICD-10 Venous insufficiency (chronic) (peripheral) Non-pressure chronic ulcer of other part of right lower leg with fat layer exposed Procedures Wound #8 Haese, Mende H. ([id number]) Pre-procedure diagnosis of Wound #8 is a Trauma, Other located on the Right,Anterior Lower Leg . There was a Excisional Skin/Subcutaneous Tissue Debridement with a total area of 2.4 sq cm performed by STONE III, [name] E., PA-C. With the following instrument(s): Curette to remove Viable and Non-Viable tissue/material. Material removed includes Subcutaneous Tissue, Slough, Skin: Dermis, and Skin: Epidermis after achieving pain control using Lidocaine 4% Topical Solution. No specimens were taken. A time out was conducted at 15:48, prior to the start of the procedure. A Minimum amount of bleeding was controlled with Pressure. The procedure was tolerated well with a pain level of 0 throughout and a pain level of 0 following the procedure. Post Debridement Measurements: 1.5cm length x 1.6cm width x 0.2cm depth; 0.377cm^3 volume. Character of Wound/Ulcer Post Debridement is improved. Post procedure Diagnosis Wound #8: Same as Pre-Procedure Plan Wound  Cleansing: Wound #8 Right,Anterior Lower Leg: Clean wound with Normal Saline. Dial antibacterial soap, wash wounds, rinse and pat dry prior to dressing wounds May Shower, gently pat wound dry prior to applying new dressing. Anesthetic (add to Medication List): Wound #8 Right,Anterior Lower Leg: Topical Lidocaine 4% cream applied to wound bed prior to debridement (In Clinic Only). Primary Wound Dressing: Wound #8 Right,Anterior Lower Leg: Hydrafera Blue Ready Transfer Secondary Dressing: Wound #8 Right,Anterior Lower Leg: ABD and Kerlix/Conform - secure with netting Dressing Change Frequency: Wound #8 Right,Anterior Lower Leg: Change Dressing Monday, Wednesday, Friday Follow-up Appointments: Return Appointment in 1 week. Home Health: Wound #13 Left,Midline Lower Leg: Continue Home Health Visits - [place] Nurse may visit PRN to address patient s wound care needs. FACE TO FACE ENCOUNTER: MEDICARE and MEDICAID PATIENTS: I certify that this patient is under my care and that I had a face-to-face encounter that meets the physician face-to-face encounter requirements with this patient on this date. The encounter with the patient was in whole or in part for the following MEDICAL CONDITION: (primary reason for [place]) MEDICAL NECESSITY: I certify, that based on my findings, NURSING services are a medically necessary home health service. HOME BOUND STATUS: I certify that my clinical findings support that this patient is homebound (i.e., Due to illness or injury, pt requires aid of supportive devices such as crutches, cane, wheelchairs, walkers, the use of special transportation or the assistance of another person to leave their place of residence. There is a normal inability to leave the home and doing so requires considerable and  taxing effort. Other absences are for medical reasons / religious services and are infrequent or of short duration when for other reasons). If  current dressing causes regression in wound condition, may D/C ordered dressing product/s and apply Normal Saline Moist Dressing daily until next [place] / Other MD appointment. [place] of regression in wound condition at [phone]. Please direct any NON-WOUND related issues/requests for orders to patient's Primary Care Physician Wound #8 Right,Anterior Lower Leg: [place] Visits - [place] Nurse may visit PRN to address patient s wound care needs. FACE TO FACE ENCOUNTER: MEDICARE and MEDICAID PATIENTS: I certify that this patient is under my care and that I had a face-to-face encounter that meets the physician face-to-face encounter requirements with this patient on this date. The encounter with the patient was in whole or in part for the following MEDICAL CONDITION: (primary reason for [place], [name] ([id number]) Healthcare) MEDICAL NECESSITY: I certify, that based on my findings, NURSING services are a medically necessary home health service. HOME BOUND STATUS: I certify that my clinical findings support that this patient is homebound (i.e., Due to illness or injury, pt requires aid of supportive devices such as crutches, cane, wheelchairs, walkers, the use of special transportation or the assistance of another person to leave their place of residence. There is a normal inability to leave the home and doing so requires considerable and taxing effort. Other absences are for medical reasons / religious services and are infrequent or of short duration when for other reasons). If current dressing causes regression in wound condition, may D/C ordered dressing product/s and apply Normal Saline Moist Dressing daily until next [place] / Other MD appointment. [place] of regression in wound condition at [phone]. Please direct any NON-WOUND related issues/requests for orders to patient's Primary  Care Physician My suggestion currently is gonna be that we continue with the above wound care measures for the next week and the patient is in agreement with plan. We will subsequently see were things stand at follow-up. I do believe that a continuation of the [facility] Dressing is going to be a good thing this is actually new dressing for her but I think it will help prevent hyper granulation in regard to the right lower extremity wound. Please see above for specific wound care orders. We will see patient for re-evaluation in 1 week(s) here in the clinic. If anything worsens or changes patient will contact our office for additional recommendations. Electronic Signature(s) Signed: 04/01/2019 9:47:59 AM By: Worthy Keeler PA-C Entered By: Worthy Keeler on 04/01/2019 03:25:42 [name], [name] ([id number]) -------------------------------------------------------------------------------- ROS/PFSH Details Patient Name: [name]. Date of Service: 03/31/2019 3:15 PM Medical Record Number: [id number] Patient Account Number: [id number] Date of Birth/Sex: [birth year] (85 y.o. F) Treating RN: Montey Hora Primary Care Provider: Park Liter Other Clinician: Referring Provider: Park Liter Treating Provider/Extender: [name], [name] Weeks in Treatment: 3 Information Obtained From Patient Constitutional Symptoms (General Health) Complaints and Symptoms: Negative for: Fatigue; Fever; Chills; Marked Weight Change Respiratory Complaints and Symptoms: Negative for: Chronic or frequent coughs; Shortness of Breath Medical History: Positive for: Asthma; Chronic Obstructive Pulmonary Disease (COPD) Negative for: Pneumothorax; Sleep Apnea; Tuberculosis Cardiovascular Complaints and Symptoms: Positive for: LE edema Negative for: Chest pain Medical History: Positive for: Arrhythmia - a-fib; Hypertension Negative for: Angina; Congestive Heart Failure; Coronary Artery Disease;  Deep Vein Thrombosis; Hypotension; Myocardial Infarction; Peripheral Arterial Disease; Peripheral Venous Disease; Phlebitis;  Vasculitis Psychiatric Complaints and Symptoms: Negative for: Anxiety; Claustrophobia Medical History: Negative for: Anorexia/bulimia; Confinement Anxiety Eyes Medical History: Positive for: Cataracts - surgery 2000; Glaucoma Endocrine Medical History: Negative for: Type I Diabetes; Type II Diabetes Genitourinary Medical History: Negative for: End Stage Renal Disease Vint, Nikitha H. ([id number]) Immunological Medical History: Negative for: Lupus Erythematosus; Raynaudos; Scleroderma Integumentary (Skin) Medical History: Negative for: History of Burn; History of pressure wounds Musculoskeletal Medical History: Negative for: Gout; Rheumatoid Arthritis; Osteoarthritis; Osteomyelitis Neurologic Medical History: Negative for: Dementia; Neuropathy; Quadriplegia; Paraplegia; Seizure Disorder Oncologic Medical History: Negative for: Received Chemotherapy; Received Radiation HBO Extended History Items Eyes: Eyes: Cataracts Glaucoma Immunizations Pneumococcal Vaccine: Received Pneumococcal Vaccination: Yes Implantable Devices None Hospitalization / Surgery History Type of Hospitalization/Surgery Colon cancer Family and Social History Cancer: Yes - Siblings,Paternal Grandparents; Diabetes: Yes - Siblings; Heart Disease: Yes - Mother,Father,Child,Paternal Grandparents; Hereditary Spherocytosis: No; Hypertension: Yes - Child; Kidney Disease: No; Lung Disease: No; Seizures: No; Stroke: No; Thyroid Problems: No; Tuberculosis: No; Former smoker - quit 40 yrs ago; Marital Status - Divorced; Alcohol Use: Never; Drug Use: No History; Caffeine Use: Rarely; Financial Concerns: No; Food, Clothing or Shelter Needs: No; Support System Lacking: No; Transportation Concerns: No Physician Affirmation I have reviewed and agree with the above information. Electronic  Signature(s) Signed: 04/01/2019 9:47:59 AM By: Worthy Keeler PA-C Signed: 04/01/2019 4:41:53 PM By: Montey Hora Entered By: Worthy Keeler on 04/01/2019 03:24:28 [name], [place] ([id number]) -------------------------------------------------------------------------------- SuperBill Details Patient Name: Carol Berger H. Date of Service: 03/31/2019 Medical Record Number: [id number] Patient Account Number: [id number] Date of Birth/Sex: 04-02-1928 (85 y.o. F) Treating RN: Montey Hora Primary Care Provider: Park Liter Other Clinician: Referring Provider: Park Liter Treating Provider/Extender: [name], [name] Weeks in Treatment: 3 Diagnosis Coding ICD-10 Codes Code Description I87.2 Venous insufficiency (chronic) (peripheral) L97.812 Non-pressure chronic ulcer of other part of right lower leg with fat layer exposed Facility Procedures CPT4 Code Description: 08657846 11042 - DEB SUBQ TISSUE 20 SQ CM/< ICD-10 Diagnosis Description N62.952 Non-pressure chronic ulcer of other part of right lower leg wit Modifier: h fat layer expo Quantity: 1 sed Physician Procedures CPT4 Code Description: 8413244 11042 - WC PHYS SUBQ TISS 20 SQ CM ICD-10 Diagnosis Description W10.272 Non-pressure chronic ulcer of other part of right lower leg wit Modifier: h fat layer expo Quantity: 1 sed Electronic Signature(s) Signed: 04/01/2019 9:47:59 AM By: Worthy Keeler PA-C Entered By: Worthy Keeler on 04/01/2019 03:25:53

## 2019-03-31 NOTE — Progress Notes (Addendum)
Carol, Wise ([id number]) Visit Report for 03/31/2019 Arrival Information Details Patient Name: [name], [name]. Date of Service: 03/31/2019 3:15 PM Medical Record Number: [id number] Patient Account Number: [id number] Date of Birth/Sex: 09/10/1927 (85 y.o. F) Treating RN: [name] Primary Care Maurianna Benard: Park Liter Other Clinician: Referring Celester Lech: Park Liter Treating Kirsty Monjaraz/Extender: [name], [name] Weeks in Treatment: 3 Visit Information History Since Last Visit Added or deleted any medications: No Patient Arrived: Ambulatory Any new allergies or adverse reactions: No Arrival Time: 15:11 Had a fall or experienced change in No Accompanied By: self activities of daily living that may affect Transfer Assistance: None risk of falls: Signs or symptoms of abuse/neglect since last visito No Hospitalized since last visit: No Has Dressing in Place as Prescribed: Yes Pain Present Now: No Electronic Signature(s) Signed: 03/31/2019 3:25:15 PM By: [name] Entered By: [name] on 03/31/2019 15:11:56 Linam, Kristol Lemmie Evens ([id number]) -------------------------------------------------------------------------------- Encounter Discharge Information Details Patient Name: Carol Berger H. Date of Service: 03/31/2019 3:15 PM Medical Record Number: [id number] Patient Account Number: [id number] Date of Birth/Sex: Nov 08, 1927 (85 y.o. F) Treating RN: Montey Hora Primary Care Shavonta Gossen: Park Liter Other Clinician: Referring Nasra Counce: Park Liter Treating Baker Kogler/Extender: [name], [name] Weeks in Treatment: 3 Encounter Discharge Information Items Post Procedure Vitals Discharge Condition: Stable Temperature (F): 98.6 Ambulatory Status: Ambulatory Pulse (bpm): 70 Discharge Destination: Home Respiratory Rate (breaths/min): 16 Transportation: Private Auto Blood Pressure (mmHg): 148/68 Accompanied By: self Schedule Follow-up Appointment:  Yes Clinical Summary of Care: Electronic Signature(s) Signed: 03/31/2019 4:49:07 PM By: Montey Hora Entered By: Montey Hora on 03/31/2019 15:56:49 Wirick, Ramya Lemmie Evens ([id number]) -------------------------------------------------------------------------------- Lower Extremity Assessment Details Patient Name: [name], [name] Date of Service: 03/31/2019 3:15 PM Medical Record Number: [id number] Patient Account Number: [id number] Date of Birth/Sex: 08/28/1928 (85 y.o. F) Treating RN: [name] Primary Care Johnelle Tafolla: Park Liter Other Clinician: Referring Jaidyn Kuhl: Park Liter Treating Larcenia Holaday/Extender: [name], [name] Weeks in Treatment: 3 Edema Assessment Assessed: [Left: No] [Right: No] Edema: [Left: No] [Right: No] Vascular Assessment Pulses: Dorsalis Pedis Palpable: [Left:Yes] [Right:Yes] Electronic Signature(s) Signed: 03/31/2019 3:25:15 PM By: [name] Entered By: [name] on 03/31/2019 15:22:13 Ogren, Kamil H. ([id number]) -------------------------------------------------------------------------------- Multi Wound Chart Details Patient Name: [name], [name] Date of Service: 03/31/2019 3:15 PM Medical Record Number: [id number] Patient Account Number: [id number] Date of Birth/Sex: [birth year] (85 y.o. F) Treating RN: Montey Hora Primary Care Kyheem Bathgate: Park Liter Other Clinician: Referring Ellison Leisure: Park Liter Treating Ilijah Doucet/Extender: [name], [name] Weeks in Treatment: 3 Vital Signs Height(in): 60 Pulse(bpm): 70 Weight(lbs): 97 Blood Pressure(mmHg): 148/58 Body Mass Index(BMI): 18 Temperature(F): 98.6 Respiratory Rate 16 (breaths/min): Photos: Wound Location: Left, Midline Forearm Left Lower Leg - Midline Right, Medial Lower Leg Wounding Event: Trauma Trauma Trauma Primary Etiology: Trauma, Other Trauma, Other Trauma, Other Comorbid History: Cataracts, Glaucoma, Asthma, Cataracts, Glaucoma, Asthma, Cataracts,  Glaucoma, Asthma, Chronic Obstructive Chronic Obstructive Chronic Obstructive Pulmonary Disease (COPD), Pulmonary Disease (COPD), Pulmonary Disease (COPD), Arrhythmia, Hypertension Arrhythmia, Hypertension Arrhythmia, Hypertension Date Acquired: 03/20/2019 03/21/2019 03/05/2019 Weeks of Treatment: 1 1 3  Wound Status: Healed - Epithelialized Open Healed - Epithelialized Measurements L x W x D 0x0x0 1x1x0.1 0x0x0 (cm) Area (cm) : 0 0.785 0 Volume (cm) : 0 0.079 0 % Reduction in Area: 100.00% 0.00% 100.00% % Reduction in Volume: 100.00% 0.00% 100.00% Classification: Partial Thickness Unclassifiable Full Thickness Without Exposed Support Structures Exudate Amount: None Present None Present None Present Exudate Type: N/A N/A N/A Exudate Color: N/A N/A N/A Wound Margin: Flat and Intact Indistinct, nonvisible Flat and  Intact Granulation Amount: None Present (0%) None Present (0%) None Present (0%) Granulation Quality: N/A N/A N/A Necrotic Amount: Large (67-100%) Large (67-100%) None Present (0%) Necrotic Tissue: Eschar Eschar N/A Exposed Structures: Fascia: No Fascia: No Fat Layer (Subcutaneous Fat Layer (Subcutaneous Fat Layer (Subcutaneous Tissue) Exposed: Yes Tissue) Exposed: No Tissue) Exposed: No Fascia: No Pareja, [name] ([id number]) Tendon: No Tendon: No Tendon: No Muscle: No Muscle: No Muscle: No Joint: No Joint: No Joint: No Bone: No Bone: No Bone: No Epithelialization: N/A Small (1-33%) Large (67-100%) Wound Number: 8 N/A N/A Photos: N/A N/A Wound Location: Right Lower Leg - Anterior N/A N/A Wounding Event: Trauma N/A N/A Primary Etiology: Trauma, Other N/A N/A Comorbid History: Cataracts, Glaucoma, Asthma, N/A N/A Chronic Obstructive Pulmonary Disease (COPD), Arrhythmia, Hypertension Date Acquired: 03/05/2019 N/A N/A Weeks of Treatment: 3 N/A N/A Wound Status: Open N/A N/A Measurements L x W x D 1.5x1.6x0.1 N/A N/A (cm) Area (cm) : 1.885 N/A  N/A Volume (cm) : 0.188 N/A N/A % Reduction in Area: 0.80% N/A N/A % Reduction in Volume: 1.10% N/A N/A Classification: Full Thickness Without N/A N/A Exposed Support Structures Exudate Amount: Medium N/A N/A Exudate Type: Serosanguineous N/A N/A Exudate Color: red, brown N/A N/A Wound Margin: Flat and Intact N/A N/A Granulation Amount: Large (67-100%) N/A N/A Granulation Quality: Red, Hyper-granulation N/A N/A Necrotic Amount: Small (1-33%) N/A N/A Necrotic Tissue: Eschar N/A N/A Exposed Structures: Fat Layer (Subcutaneous N/A N/A Tissue) Exposed: Yes Fascia: No Tendon: No Muscle: No Joint: No Bone: No Epithelialization: Large (67-100%) N/A N/A Treatment Notes Electronic Signature(s) Signed: 03/31/2019 4:49:07 PM By: Montey Hora Entered By: Montey Hora on 03/31/2019 15:48:31 MARIPOSA, SHORES ([id number]) LEIGHTON, LUSTER ([id number]) -------------------------------------------------------------------------------- Multi-Disciplinary Care Plan Details Patient Name: Carol Berger H. Date of Service: 03/31/2019 3:15 PM Medical Record Number: [id number] Patient Account Number: [id number] Date of Birth/Sex: [birth year] (85 y.o. F) Treating RN: Montey Hora Primary Care Maiah Sinning: Park Liter Other Clinician: Referring Karma Hiney: Park Liter Treating Skiler Olden/Extender: [name], [name] Weeks in Treatment: 3 Active Inactive Abuse / Safety / Falls / Self Care Management Nursing Diagnoses: Potential for falls Goals: Patient will not experience any injury related to falls Date Initiated: 03/10/2019 Target Resolution Date: 06/13/2019 Goal Status: Active Interventions: Assess fall risk on admission and as needed Notes: Necrotic Tissue Nursing Diagnoses: Impaired tissue integrity related to necrotic/devitalized tissue Goals: Patient/caregiver will verbalize understanding of reason and process for debridement of necrotic tissue Date Initiated:  03/10/2019 Target Resolution Date: 06/13/2019 Goal Status: Active Interventions: Provide education on necrotic tissue and debridement process Notes: Orientation to the Wound Care Program Nursing Diagnoses: Knowledge deficit related to the wound healing center program Goals: Patient/caregiver will verbalize understanding of the [place] Program Date Initiated: 03/10/2019 Target Resolution Date: 06/13/2019 Goal Status: Active Interventions: Provide education on orientation to the wound center [place], Zerina H. ([id number]) Notes: Wound/Skin Impairment Nursing Diagnoses: Impaired tissue integrity Goals: Ulcer/skin breakdown will heal within 14 weeks Date Initiated: 03/10/2019 Target Resolution Date: 06/13/2019 Goal Status: Active Interventions: Assess patient/caregiver ability to obtain necessary supplies Assess patient/caregiver ability to perform ulcer/skin care regimen upon admission and as needed Assess ulceration(s) every visit Notes: Electronic Signature(s) Signed: 03/31/2019 4:49:07 PM By: Montey Hora Entered By: Montey Hora on 03/31/2019 15:47:22 Cirilo, Emilyann H. ([id number]) -------------------------------------------------------------------------------- Pain Assessment Details Patient Name: [name] Date of Service: 03/31/2019 3:15 PM Medical Record Number: [id number] Patient Account Number: [id number] Date of Birth/Sex: 1928-02-16 (85 y.o. F) Treating RN: [name] Primary Care Tymarion Everard: Wynetta Emery,  MEGAN Other Clinician: Referring Copeland Lapier: Park Liter Treating Biance Moncrief/Extender: [name], [name] Weeks in Treatment: 3 Active Problems Location of Pain Severity and Description of Pain Patient Has Paino No Site Locations Pain Management and Medication Current Pain Management: Electronic Signature(s) Signed: 03/31/2019 3:25:15 PM By: [name] Entered By: [name] on 03/31/2019 15:12:02 Pesta, [name]  ([id number]) -------------------------------------------------------------------------------- Patient/Caregiver Education Details Patient Name: [name] Date of Service: 03/31/2019 3:15 PM Medical Record Number: [id number] Patient Account Number: [id number] Date of Birth/Gender: [birth year] (85 y.o. F) Treating RN: Montey Hora Primary Care Physician: Park Liter Other Clinician: Referring Physician: Park Liter Treating Physician/Extender: Sharalyn Ink in Treatment: 3 Education Assessment Education Provided To: Patient Education Topics Provided Wound/Skin Impairment: Handouts: Other: wound care s ordered Methods: Demonstration, Explain/Verbal Responses: State content correctly Electronic Signature(s) Signed: 03/31/2019 4:49:07 PM By: Montey Hora Entered By: Montey Hora on 03/31/2019 15:55:45 Lazcano, Shamel H. ([id number]) -------------------------------------------------------------------------------- Wound Assessment Details Patient Name: Rio, Hadlei H. Date of Service: 03/31/2019 3:15 PM Medical Record Number: [id number] Patient Account Number: [id number] Date of Birth/Sex: June 24, 1928 (85 y.o. F) Treating RN: Montey Hora Primary Care Kynslei Art: Park Liter Other Clinician: Referring Charene Mccallister: Park Liter Treating Caterra Ostroff/Extender: [name], [name] Weeks in Treatment: 3 Wound Status Wound Number: 12 Primary Trauma, Other Etiology: Wound Location: Left, Midline Forearm Wound Healed - Epithelialized Wounding Event: Trauma Status: Date Acquired: 03/20/2019 Comorbid Cataracts, Glaucoma, Asthma, Chronic Weeks Of Treatment: 1 History: Obstructive Pulmonary Disease (COPD), Clustered Wound: No Arrhythmia, Hypertension Photos Wound Measurements Length: (cm) 0 % R Width: (cm) 0 % R Depth: (cm) 0 Tun Area: (cm) 0 Un Volume: (cm) 0 eduction in Area: 100% eduction in Volume: 100% neling: No dermining: No Wound  Description Classification: Partial Thickness Wound Margin: Flat and Intact Exudate Amount: None Present Foul Odor After Cleansing: No Slough/Fibrino No Wound Bed Granulation Amount: None Present (0%) Exposed Structure Necrotic Amount: Large (67-100%) Fascia Exposed: No Necrotic Quality: Eschar Fat Layer (Subcutaneous Tissue) Exposed: No Tendon Exposed: No Muscle Exposed: No Joint Exposed: No Bone Exposed: No Electronic Signature(s) Signed: 03/31/2019 4:49:07 PM By: Montey Hora Previous Signature: 03/31/2019 3:25:15 PM Version By: Herbie Drape ([id number]) Entered By: Montey Hora on 03/31/2019 15:46:58 Eckman, Yecenia H. ([id number]) -------------------------------------------------------------------------------- Wound Assessment Details Patient Name: Marchena, Conchetta H. Date of Service: 03/31/2019 3:15 PM Medical Record Number: [id number] Patient Account Number: [id number] Date of Birth/Sex: [birth year] (85 y.o. F) Treating RN: [name] Primary Care Samie Barclift: Park Liter Other Clinician: Referring Jaeson Molstad: Park Liter Treating Madellyn Denio/Extender: [name], [name] Weeks in Treatment: 3 Wound Status Wound Number: 13 Primary Trauma, Other Etiology: Wound Location: Left Lower Leg - Midline Wound Open Wounding Event: Trauma Status: Date Acquired: 03/21/2019 Comorbid Cataracts, Glaucoma, Asthma, Chronic Weeks Of Treatment: 1 History: Obstructive Pulmonary Disease (COPD), Clustered Wound: No Arrhythmia, Hypertension Photos Wound Measurements Length: (cm) 1 % Reduction Width: (cm) 1 % Reduction Depth: (cm) 0.1 Epithelializ Area: (cm) 0.785 Tunneling: Volume: (cm) 0.079 Undermining in Area: 0% in Volume: 0% ation: Small (1-33%) No : No Wound Description Classification: Unclassifiable Foul Odor Af Wound Margin: Indistinct, nonvisible Slough/Fibri Exudate Amount: None Present ter Cleansing: No no No Wound Bed Granulation  Amount: None Present (0%) Exposed Structure Necrotic Amount: Large (67-100%) Fascia Exposed: No Necrotic Quality: Eschar Fat Layer (Subcutaneous Tissue) Exposed: No Tendon Exposed: No Muscle Exposed: No Joint Exposed: No Bone Exposed: No Electronic Signature(s) Signed: 03/31/2019 3:25:15 PM By: Hilton Cork, [name] ([id number]) Entered By: [name] on 03/31/2019 15:20:28 Ruscitti, Courtnie  Lemmie Evens ([id number]) -------------------------------------------------------------------------------- Wound Assessment Details Patient Name: Goodlow, Shyanna H. Date of Service: 03/31/2019 3:15 PM Medical Record Number: [id number] Patient Account Number: [id number] Date of Birth/Sex: Apr 07, 1928 (85 y.o. F) Treating RN: Montey Hora Primary Care Ariana Juul: Park Liter Other Clinician: Referring Ruston Fedora: Park Liter Treating [place]/Extender: [name], [name] Weeks in Treatment: 3 Wound Status Wound Number: 7 Primary Trauma, Other Etiology: Wound Location: Right, Medial Lower Leg Wound Healed - Epithelialized Wounding Event: Trauma Status: Date Acquired: 03/05/2019 Comorbid Cataracts, Glaucoma, Asthma, Chronic Weeks Of Treatment: 3 History: Obstructive Pulmonary Disease (COPD), Clustered Wound: No Arrhythmia, Hypertension Photos Wound Measurements Length: (cm) 0 % Reducti Width: (cm) 0 % Reducti Depth: (cm) 0 Epithelia Area: (cm) 0 Tunnelin Volume: (cm) 0 Undermin on in Area: 100% on in Volume: 100% lization: Large (67-100%) g: No ing: No Wound Description Full Thickness Without Exposed Support Foul Odor Classification: Structures Slough/Fi Wound Margin: Flat and Intact Exudate None Present Amount: After Cleansing: No brino Yes Wound Bed Granulation Amount: None Present (0%) Exposed Structure Necrotic Amount: None Present (0%) Fascia Exposed: No Fat Layer (Subcutaneous Tissue) Exposed: Yes Tendon Exposed: No Muscle Exposed: No Joint Exposed: No Bone  Exposed: No Electronic Signature(s) AMEENAH, PROSSER ([id number]) Signed: 03/31/2019 4:49:07 PM By: Montey Hora Previous Signature: 03/31/2019 3:25:15 PM Version By: [name] Entered By: Montey Hora on 03/31/2019 15:46:37 Uemura, Derotha H. ([id number]) -------------------------------------------------------------------------------- Wound Assessment Details Patient Name: Samara, Avaeh H. Date of Service: 03/31/2019 3:15 PM Medical Record Number: [id number] Patient Account Number: [id number] Date of Birth/Sex: 05/11/1928 (85 y.o. F) Treating RN: [name] Primary Care Ausencio Vaden: Park Liter Other Clinician: Referring Afton Lavalle: Park Liter Treating Warnell Rasnic/Extender: [name], [name] Weeks in Treatment: 3 Wound Status Wound Number: 8 Primary Trauma, Other Etiology: Wound Location: Right Lower Leg - Anterior Wound Open Wounding Event: Trauma Status: Date Acquired: 03/05/2019 Comorbid Cataracts, Glaucoma, Asthma, Chronic Weeks Of Treatment: 3 History: Obstructive Pulmonary Disease (COPD), Clustered Wound: No Arrhythmia, Hypertension Photos Wound Measurements Length: (cm) 1.5 Width: (cm) 1.6 Depth: (cm) 0.1 Area: (cm) 1.885 Volume: (cm) 0.188 % Reduction in Area: 0.8% % Reduction in Volume: 1.1% Epithelialization: Large (67-100%) Tunneling: No Undermining: No Wound Description Full Thickness Without Exposed Support Foul Odo Classification: Structures Slough/F Wound Margin: Flat and Intact Exudate Medium Amount: Exudate Type: Serosanguineous Exudate Color: red, brown r After Cleansing: No ibrino Yes Wound Bed Granulation Amount: Large (67-100%) Exposed Structure Granulation Quality: Red, Hyper-granulation Fascia Exposed: No Necrotic Amount: Small (1-33%) Fat Layer (Subcutaneous Tissue) Exposed: Yes Necrotic Quality: Eschar Tendon Exposed: No Muscle Exposed: No Joint Exposed: No Bone Exposed: No Ainsley, Odeal H.  ([id number]) Treatment Notes Wound #8 (Right, Anterior Lower Leg) Notes hydrafera blue, non adherent pad and conform Electronic Signature(s) Signed: 03/31/2019 3:25:15 PM By: [name] Entered By: [name] on 03/31/2019 15:21:44 Dauria, Tifanie H. ([id number]) -------------------------------------------------------------------------------- Vitals Details Patient Name: Carol Berger H. Date of Service: 03/31/2019 3:15 PM Medical Record Number: [id number] Patient Account Number: [id number] Date of Birth/Sex: 1928/07/09 (85 y.o. F) Treating RN: [name] Primary Care Sahra Converse: Park Liter Other Clinician: Referring Kito Cuffe: Park Liter Treating Susen Haskew/Extender: [name], [name] Weeks in Treatment: 3 Vital Signs Time Taken: 15:12 Temperature (F): 98.6 Height (in): 60 Pulse (bpm): 70 Weight (lbs): 92 Respiratory Rate (breaths/min): 16 Body Mass Index (BMI): 18 Blood Pressure (mmHg): 148/58 Reference Range: 80 - 120 mg / dl Electronic Signature(s) Signed: 03/31/2019 3:25:15 PM By: [name] Entered By: [name] on 03/31/2019 15:13:23

## 2019-04-07 ENCOUNTER — Other Ambulatory Visit: Payer: Self-pay

## 2019-04-07 ENCOUNTER — Encounter: Payer: Medicare Other | Attending: Physician Assistant | Admitting: Physician Assistant

## 2019-04-07 DIAGNOSIS — Z681 Body mass index (BMI) 19 or less, adult: Secondary | ICD-10-CM | POA: Diagnosis not present

## 2019-04-07 DIAGNOSIS — Z87891 Personal history of nicotine dependence: Secondary | ICD-10-CM | POA: Insufficient documentation

## 2019-04-07 DIAGNOSIS — I10 Essential (primary) hypertension: Secondary | ICD-10-CM | POA: Insufficient documentation

## 2019-04-07 DIAGNOSIS — L97812 Non-pressure chronic ulcer of other part of right lower leg with fat layer exposed: Secondary | ICD-10-CM | POA: Insufficient documentation

## 2019-04-07 DIAGNOSIS — W000XXA Fall on same level due to ice and snow, initial encounter: Secondary | ICD-10-CM | POA: Insufficient documentation

## 2019-04-07 DIAGNOSIS — S81802A Unspecified open wound, left lower leg, initial encounter: Secondary | ICD-10-CM | POA: Insufficient documentation

## 2019-04-07 DIAGNOSIS — S51811A Laceration without foreign body of right forearm, initial encounter: Secondary | ICD-10-CM | POA: Diagnosis not present

## 2019-04-07 DIAGNOSIS — S51011A Laceration without foreign body of right elbow, initial encounter: Secondary | ICD-10-CM | POA: Insufficient documentation

## 2019-04-07 DIAGNOSIS — I4891 Unspecified atrial fibrillation: Secondary | ICD-10-CM | POA: Diagnosis not present

## 2019-04-07 DIAGNOSIS — L97811 Non-pressure chronic ulcer of other part of right lower leg limited to breakdown of skin: Secondary | ICD-10-CM | POA: Diagnosis not present

## 2019-04-07 DIAGNOSIS — J449 Chronic obstructive pulmonary disease, unspecified: Secondary | ICD-10-CM | POA: Insufficient documentation

## 2019-04-07 DIAGNOSIS — L97829 Non-pressure chronic ulcer of other part of left lower leg with unspecified severity: Secondary | ICD-10-CM | POA: Diagnosis not present

## 2019-04-07 DIAGNOSIS — W230XXA Caught, crushed, jammed, or pinched between moving objects, initial encounter: Secondary | ICD-10-CM | POA: Insufficient documentation

## 2019-04-07 DIAGNOSIS — I872 Venous insufficiency (chronic) (peripheral): Secondary | ICD-10-CM | POA: Diagnosis not present

## 2019-04-07 NOTE — Progress Notes (Addendum)
[name], [name] ([id number]) Visit Report for 04/07/2019 Arrival Information Details Patient Name: [name], [name]. Date of Service: 04/07/2019 3:30 PM Medical Record Number: [id number] Patient Account Number: [id number] Date of Birth/Sex: 08-24-1928 (85 y.o. F) Treating RN: [name] Barman Primary Care [name]: [name] Other Clinician: Referring [name]: [name] Treating [name]/Extender: Melburn Wise, [name] in Treatment: 4 Visit Information History Since Last Visit Added or deleted any medications: No Patient Arrived: Ambulatory Any new allergies or adverse reactions: No Arrival Time: 15:13 Had a fall or experienced change in No Accompanied By: self activities of daily living that may affect Transfer Assistance: None risk of falls: Patient Identification Verified: Yes Signs or symptoms of abuse/neglect since last visito No Secondary Verification Process Completed: Yes Hospitalized since last visit: No Patient Requires Transmission-Based No Implantable device outside of the clinic excluding No Precautions: cellular tissue based products placed in the center Patient Has Alerts: No since last visit: Has Dressing in Place as Prescribed: Yes Pain Present Now: No Electronic Signature(s) Signed: 04/10/2019 5:41:45 PM By: [name] Entered By: [name], BSN, RN, [name], [name] on 04/07/2019 15:14:03 [name] ([id number]) -------------------------------------------------------------------------------- Clinic Level of Care Assessment Details Patient Name: [name], [name] Date of Service: 04/07/2019 3:30 PM Medical Record Number: [id number] Patient Account Number: [id number] Date of Birth/Sex: [birth year] (85 y.o. F) Treating RN: [name] Primary Care [name]: [name] Other Clinician: Referring [name]: [name] Treating [name]/Extender: Melburn Wise, [name] in Treatment: 4 Clinic Level of Care Assessment  Items TOOL 4 Quantity Score []  - Use when only an EandM is performed on FOLLOW-UP visit 0 ASSESSMENTS - Nursing Assessment / Reassessment X - Reassessment of Co-morbidities (includes updates in patient status) 1 10 X- 1 5 Reassessment of Adherence to Treatment Plan ASSESSMENTS - Wound and Skin Assessment / Reassessment []  - Simple Wound Assessment / Reassessment - one wound 0 X- 3 5 Complex Wound Assessment / Reassessment - multiple wounds []  - 0 Dermatologic / Skin Assessment (not related to wound area) ASSESSMENTS - Focused Assessment []  - Circumferential Edema Measurements - multi extremities 0 []  - 0 Nutritional Assessment / Counseling / Intervention X- 1 5 Lower Extremity Assessment (monofilament, tuning fork, pulses) []  - 0 Peripheral Arterial Disease Assessment (using hand held doppler) ASSESSMENTS - Ostomy and/or Continence Assessment and Care []  - Incontinence Assessment and Management 0 []  - 0 Ostomy Care Assessment and Management (repouching, etc.) PROCESS - Coordination of Care X - Simple Patient / Family Education for ongoing care 1 15 []  - 0 Complex (extensive) Patient / Family Education for ongoing care X- 1 10 Staff obtains Programmer, systems, Records, Test Results / Process Orders []  - 0 Staff telephones HHA, Nursing Homes / Clarify orders / etc []  - 0 Routine Transfer to another Facility (non-emergent condition) []  - 0 Routine Hospital Admission (non-emergent condition) []  - 0 New Admissions / Biomedical engineer / Ordering NPWT, Apligraf, etc. []  - 0 Emergency Hospital Admission (emergent condition) X- 1 10 Simple Discharge Coordination Manger, [name] ([id number]) []  - 0 Complex (extensive) Discharge Coordination PROCESS - Special Needs []  - Pediatric / Minor Patient Management 0 []  - 0 Isolation Patient Management []  - 0 Hearing / Language / Visual special needs []  - 0 Assessment of Community assistance (transportation, D/C planning, etc.) []   - 0 Additional assistance / Altered mentation []  - 0 Support Surface(s) Assessment (bed, cushion, seat, etc.) INTERVENTIONS - Wound Cleansing / Measurement []  - Simple Wound Cleansing - one wound  0 X- 3 5 Complex Wound Cleansing - multiple wounds X- 1 5 Wound Imaging (photographs - any number of wounds) []  - 0 Wound Tracing (instead of photographs) []  - 0 Simple Wound Measurement - one wound X- 3 5 Complex Wound Measurement - multiple wounds INTERVENTIONS - Wound Dressings X - Small Wound Dressing one or multiple wounds 2 10 []  - 0 Medium Wound Dressing one or multiple wounds []  - 0 Large Wound Dressing one or multiple wounds []  - 0 Application of Medications - topical []  - 0 Application of Medications - injection INTERVENTIONS - Miscellaneous []  - External ear exam 0 []  - 0 Specimen Collection (cultures, biopsies, blood, body fluids, etc.) []  - 0 Specimen(s) / Culture(s) sent or taken to Lab for analysis []  - 0 Patient Transfer (multiple staff / Civil Service fast streamer / Similar devices) []  - 0 Simple Staple / Suture removal (25 or less) []  - 0 Complex Staple / Suture removal (26 or more) []  - 0 Hypo / Hyperglycemic Management (close monitor of Blood Glucose) []  - 0 Ankle / Brachial Index (ABI) - do not check if billed separately X- 1 5 Vital Signs Dement, [name] ([id number]) Has the patient been seen at the hospital within the last three years: Yes Total Score: 130 Level Of Care: New/Established - Level 4 Electronic Signature(s) Signed: 04/07/2019 4:46:21 PM By: [name] Entered By: [name] on 04/07/2019 15:47:07 [name], [name] ([id number]) -------------------------------------------------------------------------------- Encounter Discharge Information Details Patient Name: Carol Berger H. Date of Service: 04/07/2019 3:30 PM Medical Record Number: [id number] Patient Account Number: [id number] Date of Birth/Sex: [birth year] (85 y.o. F) Treating RN:  [name] Primary Care Graylin Sperling: [name] Other Clinician: Referring Shanaye Rief: [name] Treating Jenina Moening/Extender: Melburn Wise, [name] in Treatment: 4 Encounter Discharge Information Items Discharge Condition: Stable Ambulatory Status: Ambulatory Discharge Destination: Home Transportation: Private Auto Accompanied By: self Schedule Follow-up Appointment: Yes Clinical Summary of Care: Electronic Signature(s) Signed: 04/07/2019 4:46:21 PM By: [name] Entered By: [name] on 04/07/2019 15:48:24 Mcallister, [name] ([id number]) -------------------------------------------------------------------------------- Lower Extremity Assessment Details Patient Name: Canevari, Taneah H. Date of Service: 04/07/2019 3:30 PM Medical Record Number: [id number] Patient Account Number: [id number] Date of Birth/Sex: 1928/08/02 (85 y.o. F) Treating RN: [name] Barman Primary Care Kaspian Muccio: [name] Other Clinician: Referring Paarth Cropper: [name] Treating Dawood Spitler/Extender: Melburn Wise, [name] in Treatment: 4 Vascular Assessment Pulses: Dorsalis Pedis Palpable: [Left:Yes] [Right:Yes] Posterior Tibial Palpable: [Left:Yes] [Right:Yes] Electronic Signature(s) Signed: 04/10/2019 5:41:45 PM By: [name] Entered By: [name], BSN, RN, [name], [name] on 04/07/2019 15:25:35 SAVREEN, GEBHARDT ([id number]) -------------------------------------------------------------------------------- Multi Wound Chart Details Patient Name: [name]. Date of Service: 04/07/2019 3:30 PM Medical Record Number: [id number] Patient Account Number: [id number] Date of Birth/Sex: 01-Jan-1928 (85 y.o. F) Treating RN: [name] Primary Care Nguyen Todorov: [name] Other Clinician: Referring Jae Bruck: [name] Treating Jniyah Dantuono/Extender: Melburn Wise, [name] in Treatment: 4 Vital Signs Height(in): 60 Pulse(bpm): 12 Weight(lbs): 80 Blood Pressure(mmHg):  118/78 Body Mass Index(BMI): 18 Temperature(F): 98.4 Respiratory Rate 16 (breaths/min): Photos: Wound Location: Left Lower Leg - Midline Right Lower Leg - Distal Right Lower Leg - Anterior Wounding Event: Trauma Trauma Trauma Primary Etiology: Trauma, Other Skin Tear Trauma, Other Comorbid History: Cataracts, Glaucoma, Asthma, Cataracts, Glaucoma, Asthma, Cataracts, Glaucoma, Asthma, Chronic Obstructive Chronic Obstructive Chronic Obstructive Pulmonary Disease (COPD), Pulmonary Disease (COPD), Pulmonary Disease (COPD), Arrhythmia, Hypertension Arrhythmia, Hypertension Arrhythmia, Hypertension Date Acquired: 03/21/2019 04/06/2019 03/05/2019 Wise of Treatment: 2 0 4 Wound Status: Open Open  Open Measurements L x W x D 0.4x0.7x0.1 1x0.1x0.1 1.2x0.5x0.1 (cm) Area (cm) : 0.22 0.079 0.471 Volume (cm) : 0.022 0.008 0.047 % Reduction in Area: 72.00% 0.00% 75.20% % Reduction in Volume: 72.20% 0.00% 75.30% Classification: Unclassifiable Partial Thickness Full Thickness Without Exposed Support Structures Exudate Amount: None Present Medium Medium Exudate Type: N/A Sanguinous Serosanguineous Exudate Color: N/A red red, brown Wound Margin: Indistinct, nonvisible Flat and Intact Flat and Intact Granulation Amount: None Present (0%) None Present (0%) Large (67-100%) Granulation Quality: N/A N/A Red, Hyper-granulation Necrotic Amount: Large (67-100%) None Present (0%) Small (1-33%) Necrotic Tissue: Eschar N/A Eschar Exposed Structures: Fascia: No Fascia: No Fat Layer (Subcutaneous Fat Layer (Subcutaneous Fat Layer (Subcutaneous Tissue) Exposed: Yes Tissue) Exposed: No Tissue) Exposed: No Fascia: No Hulsey, Sherie H. ([id number]) Tendon: No Tendon: No Tendon: No Muscle: No Muscle: No Muscle: No Joint: No Joint: No Joint: No Bone: No Bone: No Bone: No Limited to Skin Breakdown Epithelialization: Small (1-33%) Large (67-100%) Large (67-100%) Treatment Notes Electronic  Signature(s) Signed: 04/07/2019 4:46:21 PM By: [name] Entered By: [name] on 04/07/2019 15:44:01 Sinagra, [name] ([id number]) -------------------------------------------------------------------------------- Multi-Disciplinary Care Plan Details Patient Name: Carol Berger H. Date of Service: 04/07/2019 3:30 PM Medical Record Number: [id number] Patient Account Number: [id number] Date of Birth/Sex: 1927/10/26 (85 y.o. F) Treating RN: [name] Primary Care Ophelia Sipe: [name] Other Clinician: Referring Ugochi Henzler: [name] Treating Darolyn Double/Extender: Melburn Wise, [name] in Treatment: 4 Active Inactive Abuse / Safety / Falls / Self Care Management Nursing Diagnoses: Potential for falls Goals: Patient will not experience any injury related to falls Date Initiated: 03/10/2019 Target Resolution Date: 06/13/2019 Goal Status: Active Interventions: Assess fall risk on admission and as needed Notes: Necrotic Tissue Nursing Diagnoses: Impaired tissue integrity related to necrotic/devitalized tissue Goals: Patient/caregiver will verbalize understanding of reason and process for debridement of necrotic tissue Date Initiated: 03/10/2019 Target Resolution Date: 06/13/2019 Goal Status: Active Interventions: Provide education on necrotic tissue and debridement process Notes: Orientation to the Wound Care Program Nursing Diagnoses: Knowledge deficit related to the wound healing center program Goals: Patient/caregiver will verbalize understanding of the [facility] Date Initiated: 03/10/2019 Target Resolution Date: 06/13/2019 Goal Status: Active Interventions: Provide education on orientation to the wound center [place], Evalynne H. ([id number]) Notes: Wound/Skin Impairment Nursing Diagnoses: Impaired tissue integrity Goals: Ulcer/skin breakdown will heal within 14 Wise Date Initiated: 03/10/2019 Target Resolution Date: 06/13/2019 Goal  Status: Active Interventions: Assess patient/caregiver ability to obtain necessary supplies Assess patient/caregiver ability to perform ulcer/skin care regimen upon admission and as needed Assess ulceration(s) every visit Notes: Electronic Signature(s) Signed: 04/07/2019 4:46:21 PM By: [name] Entered By: [name] on 04/07/2019 15:43:49 [name], [name] ([id number]) -------------------------------------------------------------------------------- Pain Assessment Details Patient Name: Carol Berger H. Date of Service: 04/07/2019 3:30 PM Medical Record Number: [id number] Patient Account Number: [id number] Date of Birth/Sex: 10-Nov-1927 (85 y.o. F) Treating RN: [name] Barman Primary Care Renada Cronin: [name] Other Clinician: Referring Dandy Lazaro: [name] Treating Zorianna Taliaferro/Extender: Melburn Wise, [name] in Treatment: 4 Active Problems Location of Pain Severity and Description of Pain Patient Has Paino No Site Locations Pain Management and Medication Current Pain Management: Notes Patient denies pain at this time. Electronic Signature(s) Signed: 04/10/2019 5:41:45 PM By: [name] Entered By: [name], BSN, RN, [name], [name] on 04/07/2019 15:14:26 [name], [name] ([id number]) -------------------------------------------------------------------------------- Patient/Caregiver Education Details Patient Name: [name], FUNCHES. Date of Service: 04/07/2019 3:30 PM Medical Record Number: [id number] Patient Account Number: [id number] Date of Birth/Gender:  Aug 17, 1928 (85 y.o. F) Treating RN: [name] Primary Care Physician: [name] Other Clinician: Referring Physician: Park Wise Treating Physician/Extender: Sharalyn Ink in Treatment: 4 Education Assessment Education Provided To: Patient Education Topics Provided Wound/Skin Impairment: Handouts: Other: wound care s ordered Methods: Demonstration,  Explain/Verbal Responses: State content correctly Electronic Signature(s) Signed: 04/07/2019 4:46:21 PM By: [name] Entered By: [name] on 04/07/2019 15:47:26 Albrecht, Malea H. ([id number]) -------------------------------------------------------------------------------- Wound Assessment Details Patient Name: [name], [name] Date of Service: 04/07/2019 3:30 PM Medical Record Number: [id number] Patient Account Number: [id number] Date of Birth/Sex: March 13, 1928 (85 y.o. F) Treating RN: [name] Barman Primary Care Orson Rho: [name] Other Clinician: Referring Tevan Marian: [name] Treating Brandan Glauber/Extender: Melburn Wise, [name] in Treatment: 4 Wound Status Wound Number: 13 Primary Trauma, Other Etiology: Wound Location: Left Lower Leg - Midline Wound Open Wounding Event: Trauma Status: Date Acquired: 03/21/2019 Comorbid Cataracts, Glaucoma, Asthma, Chronic Wise Of Treatment: 2 History: Obstructive Pulmonary Disease (COPD), Clustered Wound: No Arrhythmia, Hypertension Photos Wound Measurements Length: (cm) 0.4 Width: (cm) 0.7 Depth: (cm) 0.1 Area: (cm) 0.22 Volume: (cm) 0.022 % Reduction in Area: 72% % Reduction in Volume: 72.2% Epithelialization: Small (1-33%) Wound Description Classification: Unclassifiable Wound Margin: Indistinct, nonvisible Exudate Amount: None Present Foul Odor After Cleansing: No Slough/Fibrino No Wound Bed Granulation Amount: None Present (0%) Exposed Structure Necrotic Amount: Large (67-100%) Fascia Exposed: No Necrotic Quality: Eschar Fat Layer (Subcutaneous Tissue) Exposed: No Tendon Exposed: No Muscle Exposed: No Joint Exposed: No Bone Exposed: No Electronic Signature(s) Signed: 04/10/2019 5:41:45 PM By: [name] [place], [place] ([id number]) Entered By: [name], BSN, RN, [name], [name] on 04/07/2019 15:22:44 SHALAINE, PAYSON  ([id number]) -------------------------------------------------------------------------------- Wound Assessment Details Patient Name: [name], [name] Date of Service: 04/07/2019 3:30 PM Medical Record Number: [id number] Patient Account Number: [id number] Date of Birth/Sex: October 18, 1927 (85 y.o. F) Treating RN: [name] Barman Primary Care Jerrel Tiberio: [name] Other Clinician: Referring Burke Terry: [name] Treating Lilianah Buffin/Extender: Melburn Wise, [name] in Treatment: 4 Wound Status Wound Number: 14 Primary Skin Tear Etiology: Wound Location: Right Lower Leg - Distal Wound Open Wounding Event: Trauma Status: Date Acquired: 04/06/2019 Comorbid Cataracts, Glaucoma, Asthma, Chronic Wise Of Treatment: 0 History: Obstructive Pulmonary Disease (COPD), Clustered Wound: No Arrhythmia, Hypertension Photos Wound Measurements Length: (cm) 1 % Reduction i Width: (cm) 0.1 % Reduction i Depth: (cm) 0.1 Epithelializa Area: (cm) 0.079 Tunneling: Volume: (cm) 0.008 Undermining: n Area: 0% n Volume: 0% tion: Large (67-100%) No No Wound Description Classification: Partial Thickness Foul Odor Aft Wound Margin: Flat and Intact Slough/Fibrin Exudate Amount: Medium Exudate Type: Sanguinous Exudate Color: red er Cleansing: No o No Wound Bed Granulation Amount: None Present (0%) Exposed Structure Necrotic Amount: None Present (0%) Fascia Exposed: No Fat Layer (Subcutaneous Tissue) Exposed: No Tendon Exposed: No Muscle Exposed: No Joint Exposed: No Bone Exposed: No Limited to Skin Breakdown Hatchell, Cherylee H. ([id number]) Treatment Notes Wound #14 (Right, Distal Lower Leg) Notes hydrafera blue, non adherent pad and conform Electronic Signature(s) Signed: 04/10/2019 5:41:45 PM By: [name] Entered By: [name], BSN, RN, [name], [name] on 04/07/2019 15:23:51 Sokolow, [name]  ([id number]) -------------------------------------------------------------------------------- Wound Assessment Details Patient Name: [name], [name] Date of Service: 04/07/2019 3:30 PM Medical Record Number: [id number] Patient Account Number: [id number] Date of Birth/Sex: 1927-10-19 (85 y.o. F) Treating RN: [name] Barman Primary Care Mckensey Berghuis: [name] Other Clinician: Referring Brack Shaddock: [name] Treating [place]am[place]it Depaoli/Extender: Melburn Wise, [name] in Treatment: 4 Wound Status Wound  Number: 8 Primary Trauma, Other Etiology: Wound Location: Right Lower Leg - Anterior Wound Open Wounding Event: Trauma Status: Date Acquired: 03/05/2019 Comorbid Cataracts, Glaucoma, Asthma, Chronic Wise Of Treatment: 4 History: Obstructive Pulmonary Disease (COPD), Clustered Wound: No Arrhythmia, Hypertension Photos Wound Measurements Length: (cm) 1.2 Width: (cm) 0.5 Depth: (cm) 0.1 Area: (cm) 0.471 Volume: (cm) 0.047 % Reduction in Area: 75.2% % Reduction in Volume: 75.3% Epithelialization: Large (67-100%) Tunneling: No Undermining: No Wound Description Full Thickness Without Exposed Support Foul Odo Classification: Structures Slough/F Wound Margin: Flat and Intact Exudate Medium Amount: Exudate Type: Serosanguineous Exudate Color: red, brown r After Cleansing: No ibrino Yes Wound Bed Granulation Amount: Large (67-100%) Exposed Structure Granulation Quality: Red, Hyper-granulation Fascia Exposed: No Necrotic Amount: Small (1-33%) Fat Layer (Subcutaneous Tissue) Exposed: Yes Necrotic Quality: Eschar Tendon Exposed: No Muscle Exposed: No Joint Exposed: No Bone Exposed: No [place], Lahari H. ([id number]) Treatment Notes Wound #8 (Right, Anterior Lower Leg) Notes hydrafera blue, non adherent pad and conform Electronic Signature(s) Signed: 04/10/2019 5:41:45 PM By: [name] Entered By: [name], BSN, RN, [name], [name] on 04/07/2019  15:24:39 Scarbro, [name] ([id number]) -------------------------------------------------------------------------------- [place] Details Patient Name: Carol Berger H. Date of Service: 04/07/2019 3:30 PM Medical Record Number: [id number] Patient Account Number: [id number] Date of Birth/Sex: Sep 16, 1927 (85 y.o. F) Treating RN: [name] Barman Primary Care Dorianne Perret: [name] Other Clinician: Referring Morgan Keinath: [name] Treating Korie Brabson/Extender: Melburn Wise, [name] in Treatment: 4 Vital Signs Time Taken: 15:14 Temperature (F): 98.4 Height (in): 60 Pulse (bpm): 63 Weight (lbs): 92 Respiratory Rate (breaths/min): 16 Body Mass Index (BMI): 18 Blood Pressure (mmHg): 118/78 Reference Range: 80 - 120 mg / dl Electronic Signature(s) Signed: 04/10/2019 5:41:45 PM By: [name] Entered By: [name], BSN, RN, [name], [name] on 04/07/2019 15:15:14

## 2019-04-07 NOTE — Progress Notes (Addendum)
[name], [name] ([id number]) Visit Report for 04/07/2019 Chief Complaint Document Details Patient Name: [name], [name]. Date of Service: 04/07/2019 3:30 PM Medical Record Number: [id number] Patient Account Number: [id number] Date of Birth/Sex: 1928-06-22 (85 y.o. F) Treating RN: Montey Hora Primary Care Provider: Park Liter Other Clinician: Referring Provider: Park Liter Treating Provider/Extender: [name], [name] Weeks in Treatment: 4 Information Obtained from: Patient Chief Complaint Right leg ulcers Electronic Signature(s) Signed: 04/07/2019 3:09:02 PM By: Worthy Keeler PA-C Entered By: Worthy Keeler on 04/07/2019 15:09:01 [name], [name] ([id number]) -------------------------------------------------------------------------------- HPI Details Patient Name: [name]. Date of Service: 04/07/2019 3:30 PM Medical Record Number: [id number] Patient Account Number: [id number] Date of Birth/Sex: September 20, 1927 (85 y.o. F) Treating RN: Montey Hora Primary Care Provider: Park Liter Other Clinician: Referring Provider: Park Liter Treating Provider/Extender: [name], [name] Weeks in Treatment: 4 History of Present Illness HPI Description: 02/12/18 ADMISSION This is an 85 year old woman who is recently moved to [place] from [place]. Her story began in February where she fell on some ice suffering extensive lacerations to her bilateral lower extremities. She is able to show me extensive pictures of the right lower leg but with going to a wound care center and [place] 3 times a week these eventually closed over. She has been left with 1 area on the upper lateral left calf. She has been applying Neosporin to this and applying a bandage. Currently this measures 2 x 1.5 cm. The patient is not a diabetic. She is an ex-smoker quitting 40 years ago. She does have COPD by description. ABIs in our clinic were 1.32 on the right and 1.03 on the left 02/19/18;  right lower leg wound which was initially trauma. The area that we look that last week is smaller. Still covered in a nonviable surface however. We are using Iodoflex 02/26/18; right lower leg wound which was initially trauma in the setting of chronic venous insufficiency. Surface of the wound looks much better healthy granulation advancing epithelialization. We have good edema control 03/05/18; right lower leg wound which was initially trauma in the setting of chronic venous insufficiency. She continues to make nice progress here. We have good edema control oHe arrives today with a new traumatic laceration on her right dorsal forearm which she states happened while she was putting on a sweatshirt 03/12/18; right lower leg wound as closed however it still looks vulnerable. She has chronic venous insufficiency. oThe new wound from last week a traumatic area on her right dorsal or arm unfortunately does not have a viable surface. I remove some nonviable skin and necrotic subcutaneous debris from the wound surface. Hemostasis with silver nitrate and direct pressure 03/19/18; right lower leg wound is closed. She has chronic venous insufficiency and will need ongoing compression oThe new wounds from 2 weeks ago on the right upper elbow is closed. The area on her right dorsal forearm continues to have a nonviable surface requiring debridement 03/26/18; we'll need to look into ongoing compression for her legs. 2 wounds on the right upper elbow is closed. The area on the right dorsal forearm looks better. Hydrofera Blue secondary to hypertrophic granulation 04/02/18; he has compression stockings although she did not wear them today. Severe chronic venous insufficiency. She has 2 wounds on the right upper elbow which I said were closed last week which actually are although they're very small and she has a smaller clean wound on the right dorsal forearm. We've been using Hydrofera Blue secondary to some  upper  granulation. All of this looks better 04/09/18-She is seen in follow up evaluation for a right elbow and right forearm skin tear. The right elbow is healed. We will continue with same treatment plan and she'll follow next week 04/16/18-She is seen in follow-up evaluation for right forearm skin tear, this is essentially healed. We will cover with foam border and she will follow-up next week 04/23/18; the patient arrives with a new skin tear on her right dorsal arm just below the elbow. She got this while carrying a box. She has a linear skin tear. There is no depth I don't think this should've been sutured. There is a skin flap medially I'm not sure if this will be viable or not 04/30/18; the new skin tear from last week unfortunately has remained viable in terms of the skin flap. She has a small open area that looks healthy. Using moistened silver collagen 05/07/18; right dorsal arm skin tear. Nonviable tissue over the remnants of the wound. We've been using silver collagen 05/21/18; right dorsal arm skin tear. Nonviable tissue over a small remnant of the wound was washed off. We've been using silver collagen which we will continue 06/04/18; the right dorsal arm skin tear has healed. She arrives today with a traumatic wound just above the olecranon of her left elbow. This is a skin tear with a nonviable nonadherent flap which was removed. We'll use silver collagen here. Also noteworthy she arrives without her compression stockings 06/18/18; the area just above the olecranon of her left elbow. It is smaller and generally has a healthy surface. I was surprised TIERNAN, MILLIKIN. ([id number]) to learn that the patient is actually changing this herself although. Appears this week she was putting some topical lidocaine on this that she bought over-the-counter at the drugstore. She did get supplies at home she didn't know how to put it on. She drove herself here today i.e. not eligible for home  health Readmission: 11/04/18 on evaluation today patient presents for evaluation our clinic news to include the she has on her lower extremity which occurred as the result of having hit this on a piece of furniture. This was back in December. She states that her daughter has been trying to get her to come into have this evaluated here the wound care center but she was being somewhat stubborn. Nonetheless upon evaluation today the wound really appears to be potentially healed she does have some swelling of lower extremity which I think will come into play. With that being said he tells me that even yesterday this was still making clear fluid and therefore there may be some issues with continued problems with the swelling and drainage secondary to her venous stasis. Nonetheless overall wound appears to be doing fairly well which is good news. 11/11/18 on evaluation today patient actually appears to be doing excellent in the area of question that was green just the day before he saw her last time actually appears to have not drain that always in past week. I think this is done very well and at this point though she's had some discomfort I do not see any evidence of open wound which he needs to continue see wound care. Readmission: 03/10/19 on evaluation today patient presents for reevaluation here in our clinic concerning issues that she actually is having with the new problem on her right lower leg due to trauma that occurred when a car door shut on her leg last Thursday. This is not been quite a week  and she does have several skin tears unfortunately that resulted from this injury. Fortunately there's no signs of active infection at this time. No fevers, chills, nausea, or vomiting noted at this time. She does have some hot tissue noted at this point that has me concerned about the possibility of needing to move this in order to allow the areas to heal appropriately. Fortunately there's no signs of  active infection at this time. 03/17/19-Patient returns to clinic at 1 week, we have been using Prisma on the larger wounds with Kerlix and Xeroform. As many as 5 wounds were number last time 3 of which have closed, leaving the right proximal tibial and right distal medial leg wound both of which are looking good 03/24/19 on evaluation today patient appears to be doing better in regard to her wounds at this time. Fortunately there is no sign of active infection at this time. No fevers chills noted. She's been tolerating the dressing changes without complication which is good news she's been performing these herself it sounds like home health is not going to be coming out it sounds as if they feel like she may be able to take care of yourself and not be technically homebound. Nonetheless though that is unfortunate it does seem like she's done fairly well with taking care of yourself from the standpoint of the dressings. 03/31/19 on evaluation today patient actually appears to be doing well with regard to her lower extremity ulcers. She had one area to heal today and in regard to her right lower extremity were she continues to have an open wound I did have to perform some sharp debridement today. Subsequently the patient still has an area of what appears to be possibly a small hematoma on the anterior portion of her left lower extremity. This is not open enough at this time but nonetheless does seem to show some evidence of hardening which is not necessarily a bad thing. Eventually this may pop off but again I'm not interested in really removing this prematurely. 04/07/19 on evaluation today patient actually appears to be doing well with regard to her lower extremity ulcers. She is been tolerating the dressing changes without complication. She seems to be making excellent progress which is great news. She does have one small new skin tear area but the good news is she was able to pull the skin back over  and it really looks like it is healing quite nicely. Overall that is just a very small region and again seems to be healing nicely. Electronic Signature(s) Signed: 04/07/2019 9:00:41 PM By: Worthy Keeler PA-C Entered By: Worthy Keeler on 04/07/2019 20:08:30 [name], [name] ([id number]) -------------------------------------------------------------------------------- Physical Exam Details Patient Name: Hildenbrand, Tamaira H. Date of Service: 04/07/2019 3:30 PM Medical Record Number: [id number] Patient Account Number: [id number] Date of Birth/Sex: [birth year] (85 y.o. F) Treating RN: Montey Hora Primary Care Provider: Park Liter Other Clinician: Referring Provider: Park Liter Treating Provider/Extender: [name], [name] Weeks in Treatment: 4 Constitutional Well-nourished and well-hydrated in no acute distress. Respiratory normal breathing without difficulty. clear to auscultation bilaterally. Cardiovascular regular rate and rhythm with normal S1, S2. Psychiatric this patient is able to make decisions and demonstrates good insight into disease process. Alert and Oriented x 3. pleasant and cooperative. Notes Patient's wound bed does appear to show evidence of good epithelialization around the edge of the wound which is great news. Overall I'm extremely pleased with this. With that being said she is making progress in this seems  to be sufficient week by week. She is actually performing the dressing changes on her own she does not want home health to come out I'm actually okay with this and she seems to be doing this so well. Electronic Signature(s) Signed: 04/07/2019 9:00:41 PM By: Worthy Keeler PA-C Entered By: Worthy Keeler on 04/07/2019 20:17:53 [name], [name] ([id number]) -------------------------------------------------------------------------------- Physician Orders Details Patient Name: [name]. Date of Service: 04/07/2019 3:30 PM Medical Record  Number: [id number] Patient Account Number: [id number] Date of Birth/Sex: Sep 05, 1927 (85 y.o. F) Treating RN: Montey Hora Primary Care Provider: Park Liter Other Clinician: Referring Provider: Park Liter Treating Provider/Extender: [name], [name] Weeks in Treatment: 4 Verbal / Phone Orders: No Diagnosis Coding ICD-10 Coding Code Description I87.2 Venous insufficiency (chronic) (peripheral) L97.812 Non-pressure chronic ulcer of other part of right lower leg with fat layer exposed Wound Cleansing Wound #14 Right,Distal Lower Leg o Clean wound with Normal Saline. o Dial antibacterial soap, wash wounds, rinse and pat dry prior to dressing wounds o May Shower, gently pat wound dry prior to applying new dressing. Wound #8 Right,Anterior Lower Leg o Clean wound with Normal Saline. o Dial antibacterial soap, wash wounds, rinse and pat dry prior to dressing wounds o May Shower, gently pat wound dry prior to applying new dressing. Anesthetic (add to Medication List) Wound #8 Right,Anterior Lower Leg o Topical Lidocaine 4% cream applied to wound bed prior to debridement (In Clinic Only). Primary Wound Dressing Wound #14 Right,Distal Lower Leg o Hydrafera Blue Ready Transfer Wound #8 Right,Anterior Lower Leg o Hydrafera Blue Ready Transfer Secondary Dressing Wound #14 Right,Distal Lower Leg o ABD and Kerlix/Conform - secure with netting Wound #8 Right,Anterior Lower Leg o ABD and Kerlix/Conform - secure with netting Dressing Change Frequency Wound #14 Right,Distal Lower Leg o Change Dressing Monday, Wednesday, Friday Wound #8 Right,Anterior Lower Leg o Change Dressing Monday, Wednesday, Friday Follow-up Appointments [name], [name] ([id number]) o Return Appointment in 1 week. Electronic Signature(s) Signed: 04/07/2019 4:46:21 PM By: Montey Hora Signed: 04/07/2019 9:00:41 PM By: Worthy Keeler PA-C Entered By: Montey Hora on 04/07/2019  15:45:08 [name], [name] ([id number]) -------------------------------------------------------------------------------- Problem List Details Patient Name: Steffek, Adell H. Date of Service: 04/07/2019 3:30 PM Medical Record Number: [id number] Patient Account Number: [id number] Date of Birth/Sex: [birth year] (85 y.o. F) Treating RN: Montey Hora Primary Care Provider: Park Liter Other Clinician: Referring Provider: Park Liter Treating Provider/Extender: [name], [name] Weeks in Treatment: 4 Active Problems ICD-10 Evaluated Encounter Code Description Active Date Today Diagnosis I87.2 Venous insufficiency (chronic) (peripheral) 03/10/2019 No Yes L97.812 Non-pressure chronic ulcer of other part of right lower leg 03/10/2019 No Yes with fat layer exposed Inactive Problems Resolved Problems Electronic Signature(s) Signed: 04/07/2019 3:08:51 PM By: Worthy Keeler PA-C Entered By: Worthy Keeler on 04/07/2019 15:08:50 [name], [name] ([id number]) -------------------------------------------------------------------------------- Progress Note Details Patient Name: Bogacki, Lache H. Date of Service: 04/07/2019 3:30 PM Medical Record Number: [id number] Patient Account Number: [id number] Date of Birth/Sex: Jan 20, 1928 (85 y.o. F) Treating RN: Montey Hora Primary Care Provider: Park Liter Other Clinician: Referring Provider: Park Liter Treating Provider/Extender: [name], [name] Weeks in Treatment: 4 Subjective Chief Complaint Information obtained from Patient Right leg ulcers History of Present Illness (HPI) 02/12/18 ADMISSION This is an 85 year old woman who is recently moved to [place] from [place]. Her story began in February where she fell on some ice suffering extensive lacerations to her bilateral lower extremities. She is able to show me extensive pictures of the right  lower leg but with going to a wound care center and [place] 3 times a week these  eventually closed over. She has been left with 1 area on the upper lateral left calf. She has been applying Neosporin to this and applying a bandage. Currently this measures 2 x 1.5 cm. The patient is not a diabetic. She is an ex-smoker quitting 40 years ago. She does have COPD by description. ABIs in our clinic were 1.32 on the right and 1.03 on the left 02/19/18; right lower leg wound which was initially trauma. The area that we look that last week is smaller. Still covered in a nonviable surface however. We are using Iodoflex 02/26/18; right lower leg wound which was initially trauma in the setting of chronic venous insufficiency. Surface of the wound looks much better healthy granulation advancing epithelialization. We have good edema control 03/05/18; right lower leg wound which was initially trauma in the setting of chronic venous insufficiency. She continues to make nice progress here. We have good edema control He arrives today with a new traumatic laceration on her right dorsal forearm which she states happened while she was putting on a sweatshirt 03/12/18; right lower leg wound as closed however it still looks vulnerable. She has chronic venous insufficiency. The new wound from last week a traumatic area on her right dorsal or arm unfortunately does not have a viable surface. I remove some nonviable skin and necrotic subcutaneous debris from the wound surface. Hemostasis with silver nitrate and direct pressure 03/19/18; right lower leg wound is closed. She has chronic venous insufficiency and will need ongoing compression The new wounds from 2 weeks ago on the right upper elbow is closed. The area on her right dorsal forearm continues to have a nonviable surface requiring debridement 03/26/18; we'll need to look into ongoing compression for her legs. 2 wounds on the right upper elbow is closed. The area on the right dorsal forearm looks better. Hydrofera Blue secondary to hypertrophic  granulation 04/02/18; he has compression stockings although she did not wear them today. Severe chronic venous insufficiency. She has 2 wounds on the right upper elbow which I said were closed last week which actually are although they're very small and she has a smaller clean wound on the right dorsal forearm. We've been using Hydrofera Blue secondary to some upper granulation. All of this looks better 04/09/18-She is seen in follow up evaluation for a right elbow and right forearm skin tear. The right elbow is healed. We will continue with same treatment plan and she'll follow next week 04/16/18-She is seen in follow-up evaluation for right forearm skin tear, this is essentially healed. We will cover with foam border and she will follow-up next week 04/23/18; the patient arrives with a new skin tear on her right dorsal arm just below the elbow. She got this while carrying a box. She has a linear skin tear. There is no depth I don't think this should've been sutured. There is a skin flap medially I'm not sure if this will be viable or not 04/30/18; the new skin tear from last week unfortunately has remained viable in terms of the skin flap. She has a small open area that looks healthy. Using moistened silver collagen 05/07/18; right dorsal arm skin tear. Nonviable tissue over the remnants of the wound. We've been using silver collagen JAMYLAH, MARINACCIO ([id number]) 05/21/18; right dorsal arm skin tear. Nonviable tissue over a small remnant of the wound was washed off. We've been using  silver collagen which we will continue 06/04/18; the right dorsal arm skin tear has healed. She arrives today with a traumatic wound just above the olecranon of her left elbow. This is a skin tear with a nonviable nonadherent flap which was removed. We'll use silver collagen here. Also noteworthy she arrives without her compression stockings 06/18/18; the area just above the olecranon of her left elbow. It is smaller and  generally has a healthy surface. I was surprised to learn that the patient is actually changing this herself although. Appears this week she was putting some topical lidocaine on this that she bought over-the-counter at the drugstore. She did get supplies at home she didn't know how to put it on. She drove herself here today i.e. not eligible for home health Readmission: 11/04/18 on evaluation today patient presents for evaluation our clinic news to include the she has on her lower extremity which occurred as the result of having hit this on a piece of furniture. This was back in December. She states that her daughter has been trying to get her to come into have this evaluated here the wound care center but she was being somewhat stubborn. Nonetheless upon evaluation today the wound really appears to be potentially healed she does have some swelling of lower extremity which I think will come into play. With that being said he tells me that even yesterday this was still making clear fluid and therefore there may be some issues with continued problems with the swelling and drainage secondary to her venous stasis. Nonetheless overall wound appears to be doing fairly well which is good news. 11/11/18 on evaluation today patient actually appears to be doing excellent in the area of question that was green just the day before he saw her last time actually appears to have not drain that always in past week. I think this is done very well and at this point though she's had some discomfort I do not see any evidence of open wound which he needs to continue see wound care. Readmission: 03/10/19 on evaluation today patient presents for reevaluation here in our clinic concerning issues that she actually is having with the new problem on her right lower leg due to trauma that occurred when a car door shut on her leg last Thursday. This is not been quite a week and she does have several skin tears unfortunately that  resulted from this injury. Fortunately there's no signs of active infection at this time. No fevers, chills, nausea, or vomiting noted at this time. She does have some hot tissue noted at this point that has me concerned about the possibility of needing to move this in order to allow the areas to heal appropriately. Fortunately there's no signs of active infection at this time. 03/17/19-Patient returns to clinic at 1 week, we have been using Prisma on the larger wounds with Kerlix and Xeroform. As many as 5 wounds were number last time 3 of which have closed, leaving the right proximal tibial and right distal medial leg wound both of which are looking good 03/24/19 on evaluation today patient appears to be doing better in regard to her wounds at this time. Fortunately there is no sign of active infection at this time. No fevers chills noted. She's been tolerating the dressing changes without complication which is good news she's been performing these herself it sounds like home health is not going to be coming out it sounds as if they feel like she may be able  to take care of yourself and not be technically homebound. Nonetheless though that is unfortunate it does seem like she's done fairly well with taking care of yourself from the standpoint of the dressings. 03/31/19 on evaluation today patient actually appears to be doing well with regard to her lower extremity ulcers. She had one area to heal today and in regard to her right lower extremity were she continues to have an open wound I did have to perform some sharp debridement today. Subsequently the patient still has an area of what appears to be possibly a small hematoma on the anterior portion of her left lower extremity. This is not open enough at this time but nonetheless does seem to show some evidence of hardening which is not necessarily a bad thing. Eventually this may pop off but again I'm not interested in really removing this  prematurely. 04/07/19 on evaluation today patient actually appears to be doing well with regard to her lower extremity ulcers. She is been tolerating the dressing changes without complication. She seems to be making excellent progress which is great news. She does have one small new skin tear area but the good news is she was able to pull the skin back over and it really looks like it is healing quite nicely. Overall that is just a very small region and again seems to be healing nicely. Patient History Information obtained from Patient. [name], [name] ([id number]) Family History Cancer - Siblings,Paternal Grandparents, Diabetes - Siblings, Heart Disease - Mother,Father,Child,Paternal Grandparents, Hypertension - Child, No family history of Hereditary Spherocytosis, Kidney Disease, Lung Disease, Seizures, Stroke, Thyroid Problems, Tuberculosis. Social History Former smoker - quit 40 yrs ago, Marital Status - Divorced, Alcohol Use - Never, Drug Use - No History, Caffeine Use - Rarely. Medical History Eyes Patient has history of Cataracts - surgery 2000, Glaucoma Respiratory Patient has history of Asthma, Chronic Obstructive Pulmonary Disease (COPD) Denies history of Pneumothorax, Sleep Apnea, Tuberculosis Cardiovascular Patient has history of Arrhythmia - a-fib, Hypertension Denies history of Angina, Congestive Heart Failure, Coronary Artery Disease, Deep Vein Thrombosis, Hypotension, Myocardial Infarction, Peripheral Arterial Disease, Peripheral Venous Disease, Phlebitis, Vasculitis Endocrine Denies history of Type I Diabetes, Type II Diabetes Genitourinary Denies history of End Stage Renal Disease Immunological Denies history of Lupus Erythematosus, Raynaud s, Scleroderma Integumentary (Skin) Denies history of History of Burn, History of pressure wounds Musculoskeletal Denies history of Gout, Rheumatoid Arthritis, Osteoarthritis, Osteomyelitis Neurologic Denies history of  Dementia, Neuropathy, Quadriplegia, Paraplegia, Seizure Disorder Oncologic Denies history of Received Chemotherapy, Received Radiation Psychiatric Denies history of Anorexia/bulimia, Confinement Anxiety Hospitalization/Surgery History - Colon cancer. Review of Systems (ROS) Constitutional Symptoms (General Health) Denies complaints or symptoms of Fatigue, Fever, Chills, Marked Weight Change. Respiratory Denies complaints or symptoms of Chronic or frequent coughs, Shortness of Breath. Cardiovascular Denies complaints or symptoms of Chest pain, LE edema. Psychiatric Denies complaints or symptoms of Anxiety, Claustrophobia. Objective [name], [name] ([id number]) Constitutional Well-nourished and well-hydrated in no acute distress. Vitals Time Taken: 3:14 PM, Height: 60 in, Weight: 92 lbs, BMI: 18, Temperature: 98.4 F, Pulse: 63 bpm, Respiratory Rate: 16 breaths/min, Blood Pressure: 118/78 mmHg. Respiratory normal breathing without difficulty. clear to auscultation bilaterally. Cardiovascular regular rate and rhythm with normal S1, S2. Psychiatric this patient is able to make decisions and demonstrates good insight into disease process. Alert and Oriented x 3. pleasant and cooperative. General Notes: Patient's wound bed does appear to show evidence of good epithelialization around the edge of the wound which is great news.  Overall I'm extremely pleased with this. With that being said she is making progress in this seems to be sufficient week by week. She is actually performing the dressing changes on her own she does not want home health to come out I'm actually okay with this and she seems to be doing this so well. Integumentary (Hair, Skin) Wound #13 status is Open. Original cause of wound was Trauma. The wound is located on the Left,Midline Lower Leg. The wound measures 0.4cm length x 0.7cm width x 0.1cm depth; 0.22cm^2 area and 0.022cm^3 volume. There is a none present amount  of drainage noted. The wound margin is indistinct and nonvisible. There is no granulation within the wound bed. There is a large (67-100%) amount of necrotic tissue within the wound bed including Eschar. Wound #14 status is Open. Original cause of wound was Trauma. The wound is located on the Right,Distal Lower Leg. The wound measures 1cm length x 0.1cm width x 0.1cm depth; 0.079cm^2 area and 0.008cm^3 volume. The wound is limited to skin breakdown. There is no tunneling or undermining noted. There is a medium amount of sanguinous drainage noted. The wound margin is flat and intact. There is no granulation within the wound bed. There is no necrotic tissue within the wound bed. Wound #8 status is Open. Original cause of wound was Trauma. The wound is located on the Right,Anterior Lower Leg. The wound measures 1.2cm length x 0.5cm width x 0.1cm depth; 0.471cm^2 area and 0.047cm^3 volume. There is Fat Layer (Subcutaneous Tissue) Exposed exposed. There is no tunneling or undermining noted. There is a medium amount of serosanguineous drainage noted. The wound margin is flat and intact. There is large (67-100%) red, hyper - granulation within the wound bed. There is a small (1-33%) amount of necrotic tissue within the wound bed including Eschar. Assessment Active Problems ICD-10 Venous insufficiency (chronic) (peripheral) Non-pressure chronic ulcer of other part of right lower leg with fat layer exposed [name], [name] ([id number]) Plan Wound Cleansing: Wound #14 Right,Distal Lower Leg: Clean wound with Normal Saline. Dial antibacterial soap, wash wounds, rinse and pat dry prior to dressing wounds May Shower, gently pat wound dry prior to applying new dressing. Wound #8 Right,Anterior Lower Leg: Clean wound with Normal Saline. Dial antibacterial soap, wash wounds, rinse and pat dry prior to dressing wounds May Shower, gently pat wound dry prior to applying new dressing. Anesthetic (add to  Medication List): Wound #8 Right,Anterior Lower Leg: Topical Lidocaine 4% cream applied to wound bed prior to debridement (In Clinic Only). Primary Wound Dressing: Wound #14 Right,Distal Lower Leg: Hydrafera Blue Ready Transfer Wound #8 Right,Anterior Lower Leg: Hydrafera Blue Ready Transfer Secondary Dressing: Wound #14 Right,Distal Lower Leg: ABD and Kerlix/Conform - secure with netting Wound #8 Right,Anterior Lower Leg: ABD and Kerlix/Conform - secure with netting Dressing Change Frequency: Wound #14 Right,Distal Lower Leg: Change Dressing Monday, Wednesday, Friday Wound #8 Right,Anterior Lower Leg: Change Dressing Monday, Wednesday, Friday Follow-up Appointments: Return Appointment in 1 week. 1. At this point I'm gonna recommend that we continue with the [facility] Dressing since the patient seems to be doing well at this time. 2. I recommend that the patient also continue to elevate her legs as much as possible. Fortunately she doesn't have significant swelling but nonetheless I do want to continue to ensure that remains the case and does not cause me difficulty with wound healing. Please see above for specific wound care orders. We will see patient for re-evaluation in 1 week(s) here in  the clinic. If anything worsens or changes patient will contact our office for additional recommendations. Electronic Signature(s) Signed: 04/07/2019 9:00:41 PM By: Worthy Keeler PA-C Entered By: Worthy Keeler on 04/07/2019 20:19:33 [name], DARDEN ([id number]) -------------------------------------------------------------------------------- ROS/PFSH Details Patient Name: [name]. Date of Service: 04/07/2019 3:30 PM Medical Record Number: [id number] Patient Account Number: [id number] Date of Birth/Sex: 24-Sep-1927 (85 y.o. F) Treating RN: Montey Hora Primary Care Provider: Park Liter Other Clinician: Referring Provider: Park Liter Treating Provider/Extender:  [name], [name] Weeks in Treatment: 4 Information Obtained From Patient Constitutional Symptoms (General Health) Complaints and Symptoms: Negative for: Fatigue; Fever; Chills; Marked Weight Change Respiratory Complaints and Symptoms: Negative for: Chronic or frequent coughs; Shortness of Breath Medical History: Positive for: Asthma; Chronic Obstructive Pulmonary Disease (COPD) Negative for: Pneumothorax; Sleep Apnea; Tuberculosis Cardiovascular Complaints and Symptoms: Negative for: Chest pain; LE edema Medical History: Positive for: Arrhythmia - a-fib; Hypertension Negative for: Angina; Congestive Heart Failure; Coronary Artery Disease; Deep Vein Thrombosis; Hypotension; Myocardial Infarction; Peripheral Arterial Disease; Peripheral Venous Disease; Phlebitis; Vasculitis Psychiatric Complaints and Symptoms: Negative for: Anxiety; Claustrophobia Medical History: Negative for: Anorexia/bulimia; Confinement Anxiety Eyes Medical History: Positive for: Cataracts - surgery 2000; Glaucoma Endocrine Medical History: Negative for: Type I Diabetes; Type II Diabetes Genitourinary Medical History: Negative for: End Stage Renal Disease Rosell, Shweta H. ([id number]) Immunological Medical History: Negative for: Lupus Erythematosus; Raynaudos; Scleroderma Integumentary (Skin) Medical History: Negative for: History of Burn; History of pressure wounds Musculoskeletal Medical History: Negative for: Gout; Rheumatoid Arthritis; Osteoarthritis; Osteomyelitis Neurologic Medical History: Negative for: Dementia; Neuropathy; Quadriplegia; Paraplegia; Seizure Disorder Oncologic Medical History: Negative for: Received Chemotherapy; Received Radiation HBO Extended History Items Eyes: Eyes: Cataracts Glaucoma Immunizations Pneumococcal Vaccine: Received Pneumococcal Vaccination: Yes Implantable Devices None Hospitalization / Surgery History Type of Hospitalization/Surgery Colon  cancer Family and Social History Cancer: Yes - Siblings,Paternal Grandparents; Diabetes: Yes - Siblings; Heart Disease: Yes - Mother,Father,Child,Paternal Grandparents; Hereditary Spherocytosis: No; Hypertension: Yes - Child; Kidney Disease: No; Lung Disease: No; Seizures: No; Stroke: No; Thyroid Problems: No; Tuberculosis: No; Former smoker - quit 40 yrs ago; Marital Status - Divorced; Alcohol Use: Never; Drug Use: No History; Caffeine Use: Rarely; Financial Concerns: No; Food, Clothing or Shelter Needs: No; Support System Lacking: No; Transportation Concerns: No Physician Affirmation I have reviewed and agree with the above information. Electronic Signature(s) Signed: 04/07/2019 9:00:41 PM By: Worthy Keeler PA-C Signed: 04/09/2019 4:34:48 PM By: Montey Hora Entered By: Worthy Keeler on 04/07/2019 20:09:21 YARELIE, HAMS ([id number]) -------------------------------------------------------------------------------- SuperBill Details Patient Name: [name] Berger H. Date of Service: 04/07/2019 Medical Record Number: [id number] Patient Account Number: [id number] Date of Birth/Sex: May 15, 1928 (85 y.o. F) Treating RN: Montey Hora Primary Care Provider: Park Liter Other Clinician: Referring Provider: Park Liter Treating Provider/Extender: [name], [name] Weeks in Treatment: 4 Diagnosis Coding ICD-10 Codes Code Description I87.2 Venous insufficiency (chronic) (peripheral) L97.812 Non-pressure chronic ulcer of other part of right lower leg with fat layer exposed Facility Procedures CPT4 Code: 27078675 Description: 99214 - WOUND CARE VISIT-LEV 4 EST PT Modifier: Quantity: 1 Physician Procedures CPT4 Code Description: 4492010 99214 - WC PHYS LEVEL 4 - EST PT ICD-10 Diagnosis Description I87.2 Venous insufficiency (chronic) (peripheral) L97.812 Non-pressure chronic ulcer of other part of right lower leg wi Modifier: th fat layer expo Quantity: 1 sed Electronic  Signature(s) Signed: 04/07/2019 9:00:41 PM By: Worthy Keeler PA-C Entered By: Worthy Keeler on 04/07/2019 20:19:48

## 2019-04-09 DIAGNOSIS — J432 Centrilobular emphysema: Secondary | ICD-10-CM | POA: Diagnosis not present

## 2019-04-09 DIAGNOSIS — J449 Chronic obstructive pulmonary disease, unspecified: Secondary | ICD-10-CM | POA: Diagnosis not present

## 2019-04-10 ENCOUNTER — Telehealth: Payer: Self-pay

## 2019-04-10 ENCOUNTER — Ambulatory Visit: Payer: Self-pay | Admitting: *Deleted

## 2019-04-10 DIAGNOSIS — I10 Essential (primary) hypertension: Secondary | ICD-10-CM

## 2019-04-10 DIAGNOSIS — J432 Centrilobular emphysema: Secondary | ICD-10-CM

## 2019-04-10 DIAGNOSIS — E440 Moderate protein-calorie malnutrition: Secondary | ICD-10-CM

## 2019-04-10 NOTE — Chronic Care Management (AMB) (Signed)
  Chronic Care Management   Outreach Note  04/10/2019 Name: [name] MRN: [id number] DOB: 25-Aug-1928  Referred by: Valerie Roys, DO Reason for referral : Chronic Care Management (Unsuccessful outreach x2)   A second unsuccessful telephone outreach was attempted today. The patient was referred to the case management team for assistance with chronic care management and care coordination.   Follow Up Plan: A HIPPA compliant phone message was left for the patient providing contact information and requesting a return call.  The care management team will reach out to the patient again over the next 30 days.   Merlene Morse Tenna Lacko RN, BSN Nurse Case Editor, [facility]ing [facility]  [phone]) Business Mobile

## 2019-04-14 ENCOUNTER — Other Ambulatory Visit: Payer: Self-pay

## 2019-04-14 ENCOUNTER — Encounter: Payer: Medicare Other | Admitting: Physician Assistant

## 2019-04-14 DIAGNOSIS — L97829 Non-pressure chronic ulcer of other part of left lower leg with unspecified severity: Secondary | ICD-10-CM | POA: Diagnosis not present

## 2019-04-14 DIAGNOSIS — I872 Venous insufficiency (chronic) (peripheral): Secondary | ICD-10-CM | POA: Diagnosis not present

## 2019-04-14 DIAGNOSIS — L97811 Non-pressure chronic ulcer of other part of right lower leg limited to breakdown of skin: Secondary | ICD-10-CM | POA: Diagnosis not present

## 2019-04-14 DIAGNOSIS — J449 Chronic obstructive pulmonary disease, unspecified: Secondary | ICD-10-CM | POA: Diagnosis not present

## 2019-04-14 DIAGNOSIS — S81802A Unspecified open wound, left lower leg, initial encounter: Secondary | ICD-10-CM | POA: Diagnosis not present

## 2019-04-14 DIAGNOSIS — I10 Essential (primary) hypertension: Secondary | ICD-10-CM | POA: Diagnosis not present

## 2019-04-14 DIAGNOSIS — S51811A Laceration without foreign body of right forearm, initial encounter: Secondary | ICD-10-CM | POA: Diagnosis not present

## 2019-04-14 DIAGNOSIS — S51011A Laceration without foreign body of right elbow, initial encounter: Secondary | ICD-10-CM | POA: Diagnosis not present

## 2019-04-14 DIAGNOSIS — L97812 Non-pressure chronic ulcer of other part of right lower leg with fat layer exposed: Secondary | ICD-10-CM | POA: Diagnosis not present

## 2019-04-14 DIAGNOSIS — I4891 Unspecified atrial fibrillation: Secondary | ICD-10-CM | POA: Diagnosis not present

## 2019-04-14 DIAGNOSIS — Z87891 Personal history of nicotine dependence: Secondary | ICD-10-CM | POA: Diagnosis not present

## 2019-04-14 NOTE — Progress Notes (Signed)
CHARNIECE, VENTURINO ([id number]) Visit Report for 04/14/2019 Chief Complaint Document Details Patient Name: [name], [name]. Date of Service: 04/14/2019 3:30 PM Medical Record Number: [id number] Patient Account Number: [id number] Date of Birth/Sex: 1927-10-19 (85 y.o. F) Treating RN: Montey Hora Primary Care Provider: Park Liter Other Clinician: Referring Provider: Park Liter Treating Provider/Extender: [name], Sevastian Witczak Weeks in Treatment: 5 Information Obtained from: Patient Chief Complaint Right leg ulcers Electronic Signature(s) Signed: 04/14/2019 3:33:09 PM By: Worthy Keeler PA-C Entered By: Worthy Keeler on 04/14/2019 15:33:08 JAYDY, FITZHENRY ([id number]) -------------------------------------------------------------------------------- Physician Orders Details Patient Name: [name] Date of Service: 04/14/2019 3:30 PM Medical Record Number: [id number] Patient Account Number: [id number] Date of Birth/Sex: March 18, 1928 (85 y.o. F) Treating RN: Montey Hora Primary Care Provider: Park Liter Other Clinician: Referring Provider: Park Liter Treating Provider/Extender: [name], Marnie Fazzino Weeks in Treatment: 5 Verbal / Phone Orders: No Diagnosis Coding ICD-10 Coding Code Description I87.2 Venous insufficiency (chronic) (peripheral) L97.812 Non-pressure chronic ulcer of other part of right lower leg with fat layer exposed Wound Cleansing Wound #13 Left,Midline Lower Leg o May Shower, gently pat wound dry prior to applying new dressing. Primary Wound Dressing Wound #13 Left,Midline Lower Leg o Silver Collagen Secondary Dressing Wound #13 Left,Midline Lower Leg o Conform/Kerlix - secured with netting o Non-adherent pad Dressing Change Frequency Wound #13 Left,Midline Lower Leg o Three times weekly Follow-up Appointments o Return Appointment in 1 week. Electronic Signature(s) Signed: 04/14/2019 4:46:42 PM By: Montey Hora Entered By: Montey Hora on 04/14/2019 16:04:08 Suchecki, [name] ([id number]) -------------------------------------------------------------------------------- Problem List Details Patient Name: [name], [name] Date of Service: 04/14/2019 3:30 PM Medical Record Number: [id number] Patient Account Number: [id number] Date of Birth/Sex: 28-Oct-1927 (85 y.o. F) Treating RN: Montey Hora Primary Care Provider: Park Liter Other Clinician: Referring Provider: Park Liter Treating Provider/Extender: [name], Chaddrick Brue Weeks in Treatment: 5 Active Problems ICD-10 Evaluated Encounter Code Description Active Date Today Diagnosis I87.2 Venous insufficiency (chronic) (peripheral) 03/10/2019 No Yes L97.812 Non-pressure chronic ulcer of other part of right lower leg 03/10/2019 No Yes with fat layer exposed Inactive Problems Resolved Problems Electronic Signature(s) Signed: 04/14/2019 3:33:00 PM By: Worthy Keeler PA-C Entered By: Worthy Keeler on 04/14/2019 15:32:59

## 2019-04-21 ENCOUNTER — Other Ambulatory Visit: Payer: Self-pay

## 2019-04-21 ENCOUNTER — Encounter: Payer: Medicare Other | Admitting: Physician Assistant

## 2019-04-21 DIAGNOSIS — S51011A Laceration without foreign body of right elbow, initial encounter: Secondary | ICD-10-CM | POA: Diagnosis not present

## 2019-04-21 DIAGNOSIS — I872 Venous insufficiency (chronic) (peripheral): Secondary | ICD-10-CM | POA: Diagnosis not present

## 2019-04-21 DIAGNOSIS — I4891 Unspecified atrial fibrillation: Secondary | ICD-10-CM | POA: Diagnosis not present

## 2019-04-21 DIAGNOSIS — L97829 Non-pressure chronic ulcer of other part of left lower leg with unspecified severity: Secondary | ICD-10-CM | POA: Diagnosis not present

## 2019-04-21 DIAGNOSIS — J449 Chronic obstructive pulmonary disease, unspecified: Secondary | ICD-10-CM | POA: Diagnosis not present

## 2019-04-21 DIAGNOSIS — S51811A Laceration without foreign body of right forearm, initial encounter: Secondary | ICD-10-CM | POA: Diagnosis not present

## 2019-04-21 DIAGNOSIS — Z87891 Personal history of nicotine dependence: Secondary | ICD-10-CM | POA: Diagnosis not present

## 2019-04-21 DIAGNOSIS — S81802A Unspecified open wound, left lower leg, initial encounter: Secondary | ICD-10-CM | POA: Diagnosis not present

## 2019-04-21 DIAGNOSIS — I10 Essential (primary) hypertension: Secondary | ICD-10-CM | POA: Diagnosis not present

## 2019-04-21 DIAGNOSIS — L97812 Non-pressure chronic ulcer of other part of right lower leg with fat layer exposed: Secondary | ICD-10-CM | POA: Diagnosis not present

## 2019-04-21 NOTE — Progress Notes (Addendum)
JACQLYN, Wise ([id number]) Visit Report for 04/21/2019 Chief Complaint Document Details Patient Name: [name], [name]. Date of Service: 04/21/2019 3:15 PM Medical Record Number: [id number] Patient Account Number: [id number] Date of Birth/Sex: [birth year] (85 y.o. F) Treating RN: Montey Hora Primary Care Provider: Park Liter Other Clinician: Referring Provider: Park Liter Treating Provider/Extender: [name], [name] Weeks in Treatment: 6 Information Obtained from: Patient Chief Complaint Right leg ulcers Electronic Signature(s) Signed: 04/21/2019 3:24:16 PM By: Worthy Keeler PA-C Entered By: Worthy Keeler on 04/21/2019 15:24:15 Seliga, Nina Lemmie Evens ([id number]) -------------------------------------------------------------------------------- HPI Details Patient Name: [name]. Date of Service: 04/21/2019 3:15 PM Medical Record Number: [id number] Patient Account Number: [id number] Date of Birth/Sex: 1928/04/27 (85 y.o. F) Treating RN: Montey Hora Primary Care Provider: Park Liter Other Clinician: Referring Provider: Park Liter Treating Provider/Extender: [name], [name] Weeks in Treatment: 6 History of Present Illness HPI Description: 02/12/18 ADMISSION This is an 85 year old woman who is recently moved to [place] from [place]. Her story began in February where she fell on some ice suffering extensive lacerations to her bilateral lower extremities. She is able to show me extensive pictures of the right lower leg but with going to a wound care center and [place] 3 times a week these eventually closed over. She has been left with 1 area on the upper lateral left calf. She has been applying Neosporin to this and applying a bandage. Currently this measures 2 x 1.5 cm. The patient is not a diabetic. She is an ex-smoker quitting 40 years ago. She does have COPD by description. ABIs in our clinic were 1.32 on the right and 1.03 on the  left 02/19/18; right lower leg wound which was initially trauma. The area that we look that last week is smaller. Still covered in a nonviable surface however. We are using Iodoflex 02/26/18; right lower leg wound which was initially trauma in the setting of chronic venous insufficiency. Surface of the wound looks much better healthy granulation advancing epithelialization. We have good edema control 03/05/18; right lower leg wound which was initially trauma in the setting of chronic venous insufficiency. She continues to make nice progress here. We have good edema control oHe arrives today with a new traumatic laceration on her right dorsal forearm which she states happened while she was putting on a sweatshirt 03/12/18; right lower leg wound as closed however it still looks vulnerable. She has chronic venous insufficiency. oThe new wound from last week a traumatic area on her right dorsal or arm unfortunately does not have a viable surface. I remove some nonviable skin and necrotic subcutaneous debris from the wound surface. Hemostasis with silver nitrate and direct pressure 03/19/18; right lower leg wound is closed. She has chronic venous insufficiency and will need ongoing compression oThe new wounds from 2 weeks ago on the right upper elbow is closed. The area on her right dorsal forearm continues to have a nonviable surface requiring debridement 03/26/18; we'll need to look into ongoing compression for her legs. 2 wounds on the right upper elbow is closed. The area on the right dorsal forearm looks better. Hydrofera Blue secondary to hypertrophic granulation 04/02/18; he has compression stockings although she did not wear them today. Severe chronic venous insufficiency. She has 2 wounds on the right upper elbow which I said were closed last week which actually are although they're very small and she has a smaller clean wound on the right dorsal forearm. We've been using Hydrofera Blue secondary to  some upper  granulation. All of this looks better 04/09/18-She is seen in follow up evaluation for a right elbow and right forearm skin tear. The right elbow is healed. We will continue with same treatment plan and she'll follow next week 04/16/18-She is seen in follow-up evaluation for right forearm skin tear, this is essentially healed. We will cover with foam border and she will follow-up next week 04/23/18; the patient arrives with a new skin tear on her right dorsal arm just below the elbow. She got this while carrying a box. She has a linear skin tear. There is no depth I don't think this should've been sutured. There is a skin flap medially I'm not sure if this will be viable or not 04/30/18; the new skin tear from last week unfortunately has remained viable in terms of the skin flap. She has a small open area that looks healthy. Using moistened silver collagen 05/07/18; right dorsal arm skin tear. Nonviable tissue over the remnants of the wound. We've been using silver collagen 05/21/18; right dorsal arm skin tear. Nonviable tissue over a small remnant of the wound was washed off. We've been using silver collagen which we will continue 06/04/18; the right dorsal arm skin tear has healed. She arrives today with a traumatic wound just above the olecranon of her left elbow. This is a skin tear with a nonviable nonadherent flap which was removed. We'll use silver collagen here. Also noteworthy she arrives without her compression stockings 06/18/18; the area just above the olecranon of her left elbow. It is smaller and generally has a healthy surface. I was surprised SIARAH, DELEO. ([id number]) to learn that the patient is actually changing this herself although. Appears this week she was putting some topical lidocaine on this that she bought over-the-counter at the drugstore. She did get supplies at home she didn't know how to put it on. She drove herself here today i.e. not eligible for home  health Readmission: 11/04/18 on evaluation today patient presents for evaluation our clinic news to include the she has on her lower extremity which occurred as the result of having hit this on a piece of furniture. This was back in December. She states that her daughter has been trying to get her to come into have this evaluated here the wound care center but she was being somewhat stubborn. Nonetheless upon evaluation today the wound really appears to be potentially healed she does have some swelling of lower extremity which I think will come into play. With that being said he tells me that even yesterday this was still making clear fluid and therefore there may be some issues with continued problems with the swelling and drainage secondary to her venous stasis. Nonetheless overall wound appears to be doing fairly well which is good news. 11/11/18 on evaluation today patient actually appears to be doing excellent in the area of question that was green just the day before he saw her last time actually appears to have not drain that always in past week. I think this is done very well and at this point though she's had some discomfort I do not see any evidence of open wound which he needs to continue see wound care. Readmission: 03/10/19 on evaluation today patient presents for reevaluation here in our clinic concerning issues that she actually is having with the new problem on her right lower leg due to trauma that occurred when a car door shut on her leg last Thursday. This is not been quite a week  and she does have several skin tears unfortunately that resulted from this injury. Fortunately there's no signs of active infection at this time. No fevers, chills, nausea, or vomiting noted at this time. She does have some hot tissue noted at this point that has me concerned about the possibility of needing to move this in order to allow the areas to heal appropriately. Fortunately there's no signs of  active infection at this time. 03/17/19-Patient returns to clinic at 1 week, we have been using Prisma on the larger wounds with Kerlix and Xeroform. As many as 5 wounds were number last time 3 of which have closed, leaving the right proximal tibial and right distal medial leg wound both of which are looking good 03/24/19 on evaluation today patient appears to be doing better in regard to her wounds at this time. Fortunately there is no sign of active infection at this time. No fevers chills noted. She's been tolerating the dressing changes without complication which is good news she's been performing these herself it sounds like home health is not going to be coming out it sounds as if they feel like she may be able to take care of yourself and not be technically homebound. Nonetheless though that is unfortunate it does seem like she's done fairly well with taking care of yourself from the standpoint of the dressings. 03/31/19 on evaluation today patient actually appears to be doing well with regard to her lower extremity ulcers. She had one area to heal today and in regard to her right lower extremity were she continues to have an open wound I did have to perform some sharp debridement today. Subsequently the patient still has an area of what appears to be possibly a small hematoma on the anterior portion of her left lower extremity. This is not open enough at this time but nonetheless does seem to show some evidence of hardening which is not necessarily a bad thing. Eventually this may pop off but again I'm not interested in really removing this prematurely. 04/07/19 on evaluation today patient actually appears to be doing well with regard to her lower extremity ulcers. She is been tolerating the dressing changes without complication. She seems to be making excellent progress which is great news. She does have one small new skin tear area but the good news is she was able to pull the skin back over  and it really looks like it is healing quite nicely. Overall that is just a very small region and again seems to be healing nicely. 04/14/19 upon evaluation today patient actually appears to be doing quite well with regard to the wounds on the right lower extremity. In fact everything appears to be healed as of today. This is excellent news. She does however have the area on the left lower extremity which is showing some signs of loosening up we have been watching this area it is no longer fluctuant and in fact with the Betadine on it it is been slowly dry now but is now lifting up on the edge. There may be a wound underneath this region. 04/21/2019 on evaluation today patient appears to be doing excellent in regard to her bilateral lower extremities. In fact she has been tolerating the dressing changes without complication does not seem to show any signs of infection at this point. In fact based on what I am seeing currently I think she is completely healed. MAYCEE, BLASCO ([id number]) Electronic Signature(s) Signed: 04/22/2019 8:11:30 AM By: Worthy Keeler  PA-C Entered By: Worthy Keeler on 04/22/2019 08:11:30 Wiatrek, [name] ([id number]) -------------------------------------------------------------------------------- Physical Exam Details Patient Name: Evinger, Aryka H. Date of Service: 04/21/2019 3:15 PM Medical Record Number: [id number] Patient Account Number: [id number] Date of Birth/Sex: 05-30-1928 (85 y.o. F) Treating RN: Montey Hora Primary Care Provider: Park Liter Other Clinician: Referring Provider: Park Liter Treating Provider/Extender: [name], [name] Weeks in Treatment: 6 Constitutional Well-nourished and well-hydrated in no acute distress. Respiratory normal breathing without difficulty. Psychiatric this patient is able to make decisions and demonstrates good insight into disease process. Alert and Oriented x 3. pleasant and  cooperative. Notes Upon further inspection patient's wounds actually appear to be completely healed on both lower extremities I am very pleased in this regard. There is no sign of active infection at this time which is good news and overall I think the biggest thing is just to be protecting her leg from getting bumped and anything reopening. Electronic Signature(s) Signed: 04/22/2019 8:11:57 AM By: Worthy Keeler PA-C Entered By: Worthy Keeler on 04/22/2019 08:11:57 TONEE, SILVERSTEIN ([id number]) -------------------------------------------------------------------------------- Physician Orders Details Patient Name: [name]. Date of Service: 04/21/2019 3:15 PM Medical Record Number: [id number] Patient Account Number: [id number] Date of Birth/Sex: 1928-06-17 (85 y.o. F) Treating RN: Montey Hora Primary Care Provider: Park Liter Other Clinician: Referring Provider: Park Liter Treating Provider/Extender: [name], [name] Weeks in Treatment: 6 Verbal / Phone Orders: No Diagnosis Coding ICD-10 Coding Code Description I87.2 Venous insufficiency (chronic) (peripheral) L97.812 Non-pressure chronic ulcer of other part of right lower leg with fat layer exposed Discharge From [place] o Discharge from [place] - Please call with any concerns Electronic Signature(s) Signed: 04/21/2019 5:30:55 PM By: Montey Hora Signed: 04/22/2019 7:18:12 PM By: Worthy Keeler PA-C Entered By: Montey Hora on 04/21/2019 15:27:35 Crutchley, Katrice H. ([id number]) -------------------------------------------------------------------------------- Problem List Details Patient Name: Main, Roni H. Date of Service: 04/21/2019 3:15 PM Medical Record Number: [id number] Patient Account Number: [id number] Date of Birth/Sex: November 17, 1927 (85 y.o. F) Treating RN: Montey Hora Primary Care Provider: Park Liter Other Clinician: Referring Provider: Park Liter Treating  Provider/Extender: [name], [name] Weeks in Treatment: 6 Active Problems ICD-10 Evaluated Encounter Code Description Active Date Today Diagnosis I87.2 Venous insufficiency (chronic) (peripheral) 03/10/2019 No Yes L97.812 Non-pressure chronic ulcer of other part of right lower leg 03/10/2019 No Yes with fat layer exposed Inactive Problems Resolved Problems Electronic Signature(s) Signed: 04/21/2019 3:24:08 PM By: Worthy Keeler PA-C Entered By: Worthy Keeler on 04/21/2019 15:24:08 Yarbrough, [name] ([id number]) -------------------------------------------------------------------------------- Progress Note Details Patient Name: Beeks, Ethelene H. Date of Service: 04/21/2019 3:15 PM Medical Record Number: [id number] Patient Account Number: [id number] Date of Birth/Sex: [birth year] (85 y.o. F) Treating RN: Montey Hora Primary Care Provider: Park Liter Other Clinician: Referring Provider: Park Liter Treating Provider/Extender: [name], [name] Weeks in Treatment: 6 Subjective Chief Complaint Information obtained from Patient Right leg ulcers History of Present Illness (HPI) 02/12/18 ADMISSION This is an 85 year old woman who is recently moved to [place] from [place]. Her story began in February where she fell on some ice suffering extensive lacerations to her bilateral lower extremities. She is able to show me extensive pictures of the right lower leg but with going to a wound care center and [place] 3 times a week these eventually closed over. She has been left with 1 area on the upper lateral left calf. She has been applying Neosporin to this and applying a bandage. Currently this measures 2 x 1.5 cm. The  patient is not a diabetic. She is an ex-smoker quitting 40 years ago. She does have COPD by description. ABIs in our clinic were 1.32 on the right and 1.03 on the left 02/19/18; right lower leg wound which was initially trauma. The area that we look that last week is  smaller. Still covered in a nonviable surface however. We are using Iodoflex 02/26/18; right lower leg wound which was initially trauma in the setting of chronic venous insufficiency. Surface of the wound looks much better healthy granulation advancing epithelialization. We have good edema control 03/05/18; right lower leg wound which was initially trauma in the setting of chronic venous insufficiency. She continues to make nice progress here. We have good edema control He arrives today with a new traumatic laceration on her right dorsal forearm which she states happened while she was putting on a sweatshirt 03/12/18; right lower leg wound as closed however it still looks vulnerable. She has chronic venous insufficiency. The new wound from last week a traumatic area on her right dorsal or arm unfortunately does not have a viable surface. I remove some nonviable skin and necrotic subcutaneous debris from the wound surface. Hemostasis with silver nitrate and direct pressure 03/19/18; right lower leg wound is closed. She has chronic venous insufficiency and will need ongoing compression The new wounds from 2 weeks ago on the right upper elbow is closed. The area on her right dorsal forearm continues to have a nonviable surface requiring debridement 03/26/18; we'll need to look into ongoing compression for her legs. 2 wounds on the right upper elbow is closed. The area on the right dorsal forearm looks better. Hydrofera Blue secondary to hypertrophic granulation 04/02/18; he has compression stockings although she did not wear them today. Severe chronic venous insufficiency. She has 2 wounds on the right upper elbow which I said were closed last week which actually are although they're very small and she has a smaller clean wound on the right dorsal forearm. We've been using Hydrofera Blue secondary to some upper granulation. All of this looks better 04/09/18-She is seen in follow up evaluation for a right  elbow and right forearm skin tear. The right elbow is healed. We will continue with same treatment plan and she'll follow next week 04/16/18-She is seen in follow-up evaluation for right forearm skin tear, this is essentially healed. We will cover with foam border and she will follow-up next week 04/23/18; the patient arrives with a new skin tear on her right dorsal arm just below the elbow. She got this while carrying a box. She has a linear skin tear. There is no depth I don't think this should've been sutured. There is a skin flap medially I'm not sure if this will be viable or not 04/30/18; the new skin tear from last week unfortunately has remained viable in terms of the skin flap. She has a small open area that looks healthy. Using moistened silver collagen 05/07/18; right dorsal arm skin tear. Nonviable tissue over the remnants of the wound. We've been using silver collagen BREEANA, SAWTELLE ([id number]) 05/21/18; right dorsal arm skin tear. Nonviable tissue over a small remnant of the wound was washed off. We've been using silver collagen which we will continue 06/04/18; the right dorsal arm skin tear has healed. She arrives today with a traumatic wound just above the olecranon of her left elbow. This is a skin tear with a nonviable nonadherent flap which was removed. We'll use silver collagen here. Also noteworthy she arrives  without her compression stockings 06/18/18; the area just above the olecranon of her left elbow. It is smaller and generally has a healthy surface. I was surprised to learn that the patient is actually changing this herself although. Appears this week she was putting some topical lidocaine on this that she bought over-the-counter at the drugstore. She did get supplies at home she didn't know how to put it on. She drove herself here today i.e. not eligible for home health Readmission: 11/04/18 on evaluation today patient presents for evaluation our clinic news to include  the she has on her lower extremity which occurred as the result of having hit this on a piece of furniture. This was back in December. She states that her daughter has been trying to get her to come into have this evaluated here the wound care center but she was being somewhat stubborn. Nonetheless upon evaluation today the wound really appears to be potentially healed she does have some swelling of lower extremity which I think will come into play. With that being said he tells me that even yesterday this was still making clear fluid and therefore there may be some issues with continued problems with the swelling and drainage secondary to her venous stasis. Nonetheless overall wound appears to be doing fairly well which is good news. 11/11/18 on evaluation today patient actually appears to be doing excellent in the area of question that was green just the day before he saw her last time actually appears to have not drain that always in past week. I think this is done very well and at this point though she's had some discomfort I do not see any evidence of open wound which he needs to continue see wound care. Readmission: 03/10/19 on evaluation today patient presents for reevaluation here in our clinic concerning issues that she actually is having with the new problem on her right lower leg due to trauma that occurred when a car door shut on her leg last Thursday. This is not been quite a week and she does have several skin tears unfortunately that resulted from this injury. Fortunately there's no signs of active infection at this time. No fevers, chills, nausea, or vomiting noted at this time. She does have some hot tissue noted at this point that has me concerned about the possibility of needing to move this in order to allow the areas to heal appropriately. Fortunately there's no signs of active infection at this time. 03/17/19-Patient returns to clinic at 1 week, we have been using Prisma on the  larger wounds with Kerlix and Xeroform. As many as 5 wounds were number last time 3 of which have closed, leaving the right proximal tibial and right distal medial leg wound both of which are looking good 03/24/19 on evaluation today patient appears to be doing better in regard to her wounds at this time. Fortunately there is no sign of active infection at this time. No fevers chills noted. She's been tolerating the dressing changes without complication which is good news she's been performing these herself it sounds like home health is not going to be coming out it sounds as if they feel like she may be able to take care of yourself and not be technically homebound. Nonetheless though that is unfortunate it does seem like she's done fairly well with taking care of yourself from the standpoint of the dressings. 03/31/19 on evaluation today patient actually appears to be doing well with regard to her lower extremity ulcers.  She had one area to heal today and in regard to her right lower extremity were she continues to have an open wound I did have to perform some sharp debridement today. Subsequently the patient still has an area of what appears to be possibly a small hematoma on the anterior portion of her left lower extremity. This is not open enough at this time but nonetheless does seem to show some evidence of hardening which is not necessarily a bad thing. Eventually this may pop off but again I'm not interested in really removing this prematurely. 04/07/19 on evaluation today patient actually appears to be doing well with regard to her lower extremity ulcers. She is been tolerating the dressing changes without complication. She seems to be making excellent progress which is great news. She does have one small new skin tear area but the good news is she was able to pull the skin back over and it really looks like it is healing quite nicely. Overall that is just a very small region and again seems  to be healing nicely. 04/14/19 upon evaluation today patient actually appears to be doing quite well with regard to the wounds on the right lower extremity. In fact everything appears to be healed as of today. This is excellent news. She does however have the area on the left lower extremity which is showing some signs of loosening up we have been watching this area it is no longer fluctuant and in fact with the Betadine on it it is been slowly dry now but is now lifting up on the edge. There may be a wound underneath Forbess, [place] ([id number]) this region. 04/21/2019 on evaluation today patient appears to be doing excellent in regard to her bilateral lower extremities. In fact she has been tolerating the dressing changes without complication does not seem to show any signs of infection at this point. In fact based on what I am seeing currently I think she is completely healed. Patient History Information obtained from Patient. Family History Cancer - Siblings,Paternal Grandparents, Diabetes - Siblings, Heart Disease - Mother,Father,Child,Paternal Grandparents, Hypertension - Child, No family history of Hereditary Spherocytosis, Kidney Disease, Lung Disease, Seizures, Stroke, Thyroid Problems, Tuberculosis. Social History Former smoker - quit 40 yrs ago, Marital Status - Divorced, Alcohol Use - Never, Drug Use - No History, Caffeine Use - Rarely. Medical History Eyes Patient has history of Cataracts - surgery 2000, Glaucoma Respiratory Patient has history of Asthma, Chronic Obstructive Pulmonary Disease (COPD) Denies history of Pneumothorax, Sleep Apnea, Tuberculosis Cardiovascular Patient has history of Arrhythmia - a-fib, Hypertension Denies history of Angina, Congestive Heart Failure, Coronary Artery Disease, Deep Vein Thrombosis, Hypotension, Myocardial Infarction, Peripheral Arterial Disease, Peripheral Venous Disease, Phlebitis, Vasculitis Endocrine Denies history of Type I  Diabetes, Type II Diabetes Genitourinary Denies history of End Stage Renal Disease Immunological Denies history of Lupus Erythematosus, Raynaud s, Scleroderma Integumentary (Skin) Denies history of History of Burn, History of pressure wounds Musculoskeletal Denies history of Gout, Rheumatoid Arthritis, Osteoarthritis, Osteomyelitis Neurologic Denies history of Dementia, Neuropathy, Quadriplegia, Paraplegia, Seizure Disorder Oncologic Denies history of Received Chemotherapy, Received Radiation Psychiatric Denies history of Anorexia/bulimia, Confinement Anxiety Hospitalization/Surgery History - Colon cancer. Review of Systems (ROS) Constitutional Symptoms (General Health) Denies complaints or symptoms of Fatigue, Fever, Chills, Marked Weight Change. Respiratory Denies complaints or symptoms of Chronic or frequent coughs, Shortness of Breath. Cardiovascular Denies complaints or symptoms of Chest pain, LE edema. Psychiatric Denies complaints or symptoms of Anxiety, Claustrophobia. DANECIA, UNDERDOWN H. ([id number]) Objective  Constitutional Well-nourished and well-hydrated in no acute distress. Vitals Time Taken: 3:15 PM, Height: 60 in, Weight: 92 lbs, BMI: 18, Temperature: 98.8 F, Pulse: 63 bpm, Respiratory Rate: 18 breaths/min, Blood Pressure: 111/98 mmHg. Respiratory normal breathing without difficulty. Psychiatric this patient is able to make decisions and demonstrates good insight into disease process. Alert and Oriented x 3. pleasant and cooperative. General Notes: Upon further inspection patient's wounds actually appear to be completely healed on both lower extremities I am very pleased in this regard. There is no sign of active infection at this time which is good news and overall I think the biggest thing is just to be protecting her leg from getting bumped and anything reopening. Integumentary (Hair, Skin) Wound #13 status is Healed - Epithelialized. Original cause of  wound was Trauma. The wound is located on the Left,Midline Lower Leg. The wound measures 0cm length x 0cm width x 0cm depth; 0cm^2 area and 0cm^3 volume. There is no tunneling or undermining noted. There is a none present amount of drainage noted. The wound margin is indistinct and nonvisible. There is no granulation within the wound bed. There is a large (67-100%) amount of necrotic tissue within the wound bed including Eschar. Assessment Active Problems ICD-10 Venous insufficiency (chronic) (peripheral) Non-pressure chronic ulcer of other part of right lower leg with fat layer exposed Plan Discharge From [facility] Services: Discharge from [place] - Please call with any concerns EVORA, SCHECHTER. ([id number]) At this point since patient is healed I would recommend that we discontinue wound care services though I did recommend a protective dressing over the right lower extremity ulcer that has healed in order to prevent this from getting bumped and reopened. The patient is in agreement with the plan. If anything changes or worsens in the meantime she will contact the office and let me know otherwise we will just see her back as needed at this point. Electronic Signature(s) Signed: 04/22/2019 8:21:55 AM By: Worthy Keeler PA-C Entered By: Worthy Keeler on 04/22/2019 08:21:55 ZAYLYNN, RICKETT ([id number]) -------------------------------------------------------------------------------- ROS/PFSH Details Patient Name: [name]. Date of Service: 04/21/2019 3:15 PM Medical Record Number: [id number] Patient Account Number: [id number] Date of Birth/Sex: 06/07/1928 (85 y.o. F) Treating RN: Montey Hora Primary Care Provider: Park Liter Other Clinician: Referring Provider: Park Liter Treating Provider/Extender: [name], [name] Weeks in Treatment: 6 Information Obtained From Patient Constitutional Symptoms (General Health) Complaints and Symptoms: Negative for:  Fatigue; Fever; Chills; Marked Weight Change Respiratory Complaints and Symptoms: Negative for: Chronic or frequent coughs; Shortness of Breath Medical History: Positive for: Asthma; Chronic Obstructive Pulmonary Disease (COPD) Negative for: Pneumothorax; Sleep Apnea; Tuberculosis Cardiovascular Complaints and Symptoms: Negative for: Chest pain; LE edema Medical History: Positive for: Arrhythmia - a-fib; Hypertension Negative for: Angina; Congestive Heart Failure; Coronary Artery Disease; Deep Vein Thrombosis; Hypotension; Myocardial Infarction; Peripheral Arterial Disease; Peripheral Venous Disease; Phlebitis; Vasculitis Psychiatric Complaints and Symptoms: Negative for: Anxiety; Claustrophobia Medical History: Negative for: Anorexia/bulimia; Confinement Anxiety Eyes Medical History: Positive for: Cataracts - surgery 2000; Glaucoma Endocrine Medical History: Negative for: Type I Diabetes; Type II Diabetes Genitourinary Medical History: Negative for: End Stage Renal Disease Manger, Rikayla H. ([id number]) Immunological Medical History: Negative for: Lupus Erythematosus; Raynaudos; Scleroderma Integumentary (Skin) Medical History: Negative for: History of Burn; History of pressure wounds Musculoskeletal Medical History: Negative for: Gout; Rheumatoid Arthritis; Osteoarthritis; Osteomyelitis Neurologic Medical History: Negative for: Dementia; Neuropathy; Quadriplegia; Paraplegia; Seizure Disorder Oncologic Medical History: Negative for: Received Chemotherapy; Received Radiation HBO Extended History  Items Eyes: Eyes: Cataracts Glaucoma Immunizations Pneumococcal Vaccine: Received Pneumococcal Vaccination: Yes Implantable Devices None Hospitalization / Surgery History Type of Hospitalization/Surgery Colon cancer Family and Social History Cancer: Yes - Siblings,Paternal Grandparents; Diabetes: Yes - Siblings; Heart Disease: Yes -  Mother,Father,Child,Paternal Grandparents; Hereditary Spherocytosis: No; Hypertension: Yes - Child; Kidney Disease: No; Lung Disease: No; Seizures: No; Stroke: No; Thyroid Problems: No; Tuberculosis: No; Former smoker - quit 40 yrs ago; Marital Status - Divorced; Alcohol Use: Never; Drug Use: No History; Caffeine Use: Rarely; Financial Concerns: No; Food, Clothing or Shelter Needs: No; Support System Lacking: No; Transportation Concerns: No Physician Affirmation I have reviewed and agree with the above information. Electronic Signature(s) Signed: 04/22/2019 4:23:45 PM By: Montey Hora Signed: 04/22/2019 7:18:12 PM By: Worthy Keeler PA-C Entered By: Worthy Keeler on 04/22/2019 08:11:45 Horney, Elani Lemmie Evens ([id number]) -------------------------------------------------------------------------------- SuperBill Details Patient Name: [name] Date of Service: 04/21/2019 Medical Record Number: [id number] Patient Account Number: [id number] Date of Birth/Sex: 1927/10/09 (85 y.o. F) Treating RN: Montey Hora Primary Care Provider: Park Liter Other Clinician: Referring Provider: Park Liter Treating Provider/Extender: [name], [name] Weeks in Treatment: 6 Diagnosis Coding ICD-10 Codes Code Description I87.2 Venous insufficiency (chronic) (peripheral) L97.812 Non-pressure chronic ulcer of other part of right lower leg with fat layer exposed Facility Procedures CPT4 Code: [id number] Description: 99213 - WOUND CARE VISIT-LEV 3 EST PT Modifier: Quantity: 1 Physician Procedures CPT4 Code Description: [id number] [id number] - WC PHYS LEVEL 3 - EST PT ICD-10 Diagnosis Description I87.2 Venous insufficiency (chronic) (peripheral) L97.812 Non-pressure chronic ulcer of other part of right lower leg wi Modifier: th fat layer expo Quantity: 1 sed Electronic Signature(s) Signed: 04/22/2019 8:22:10 AM By: Worthy Keeler PA-C Entered By: Worthy Keeler on 04/22/2019 08:22:09

## 2019-04-22 NOTE — Progress Notes (Signed)
SHEKIRA, DRUMMER ([id number]) Visit Report for 04/14/2019 Arrival Information Details Patient Name: [name]. Date of Service: 04/14/2019 3:30 PM Medical Record Number: [id number] Patient Account Number: [id number] Date of Birth/Sex: [birth year] (85 y.o. F) Treating RN: [name] Primary Care Nishaan Stanke: Park Liter Other Clinician: Referring Azlynn Mitnick: Park Liter Treating Ladan Vanderzanden/Extender: [name], [name] Weeks in Treatment: 5 Visit Information History Since Last Visit Added or deleted any medications: No Patient Arrived: Ambulatory Any new allergies or adverse reactions: No Arrival Time: 15:24 Had a fall or experienced change in No Accompanied By: self activities of daily living that may affect Transfer Assistance: None risk of falls: Patient Requires Transmission-Based No Signs or symptoms of abuse/neglect since last visito No Precautions: Hospitalized since last visit: No Patient Has Alerts: No Has Dressing in Place as Prescribed: Yes Pain Present Now: No Electronic Signature(s) Signed: 04/14/2019 3:37:18 PM By: [name] Entered By: [name] on 04/14/2019 15:24:21 Mcquary, [name] ([id number]) -------------------------------------------------------------------------------- Clinic Level of Care Assessment Details Patient Name: [name], [name] Date of Service: 04/14/2019 3:30 PM Medical Record Number: [id number] Patient Account Number: [id number] Date of Birth/Sex: Jul 07, 1928 (85 y.o. F) Treating RN: Montey Hora Primary Care Chanon Loney: Park Liter Other Clinician: Referring Lyndell Allaire: Park Liter Treating Maricia Scotti/Extender: [name], [name] Weeks in Treatment: 5 Clinic Level of Care Assessment Items TOOL 4 Quantity Score []  - Use when only an EandM is performed on FOLLOW-UP visit 0 ASSESSMENTS - Nursing Assessment / Reassessment X - Reassessment of Co-morbidities (includes updates in patient status) 1 10 X- 1 5 Reassessment of  Adherence to Treatment Plan ASSESSMENTS - Wound and Skin Assessment / Reassessment []  - Simple Wound Assessment / Reassessment - one wound 0 X- 3 5 Complex Wound Assessment / Reassessment - multiple wounds []  - 0 Dermatologic / Skin Assessment (not related to wound area) ASSESSMENTS - Focused Assessment []  - Circumferential Edema Measurements - multi extremities 0 []  - 0 Nutritional Assessment / Counseling / Intervention X- 1 5 Lower Extremity Assessment (monofilament, tuning fork, pulses) []  - 0 Peripheral Arterial Disease Assessment (using hand held doppler) ASSESSMENTS - Ostomy and/or Continence Assessment and Care []  - Incontinence Assessment and Management 0 []  - 0 Ostomy Care Assessment and Management (repouching, etc.) PROCESS - Coordination of Care X - Simple Patient / Family Education for ongoing care 1 15 []  - 0 Complex (extensive) Patient / Family Education for ongoing care X- 1 10 Staff obtains Programmer, systems, Records, Test Results / Process Orders []  - 0 Staff telephones HHA, Nursing Homes / Clarify orders / etc []  - 0 Routine Transfer to another Facility (non-emergent condition) []  - 0 Routine Hospital Admission (non-emergent condition) []  - 0 New Admissions / Biomedical engineer / Ordering NPWT, Apligraf, etc. []  - 0 Emergency Hospital Admission (emergent condition) X- 1 10 Simple Discharge Coordination [name], HIBBITTS. ([id number]) []  - 0 Complex (extensive) Discharge Coordination PROCESS - Special Needs []  - Pediatric / Minor Patient Management 0 []  - 0 Isolation Patient Management []  - 0 Hearing / Language / Visual special needs []  - 0 Assessment of Community assistance (transportation, D/C planning, etc.) []  - 0 Additional assistance / Altered mentation []  - 0 Support Surface(s) Assessment (bed, cushion, seat, etc.) INTERVENTIONS - Wound Cleansing / Measurement []  - Simple Wound Cleansing - one wound 0 X- 3 5 Complex Wound Cleansing -  multiple wounds X- 1 5 Wound Imaging (photographs - any number of wounds) []  - 0 Wound Tracing (instead of photographs) []  - 0 Simple Wound Measurement -  one wound X- 3 5 Complex Wound Measurement - multiple wounds INTERVENTIONS - Wound Dressings X - Small Wound Dressing one or multiple wounds 1 10 []  - 0 Medium Wound Dressing one or multiple wounds []  - 0 Large Wound Dressing one or multiple wounds []  - 0 Application of Medications - topical []  - 0 Application of Medications - injection INTERVENTIONS - Miscellaneous []  - External ear exam 0 []  - 0 Specimen Collection (cultures, biopsies, blood, body fluids, etc.) []  - 0 Specimen(s) / Culture(s) sent or taken to Lab for analysis []  - 0 Patient Transfer (multiple staff / Civil Service fast streamer / Similar devices) []  - 0 Simple Staple / Suture removal (25 or less) []  - 0 Complex Staple / Suture removal (26 or more) []  - 0 Hypo / Hyperglycemic Management (close monitor of Blood Glucose) []  - 0 Ankle / Brachial Index (ABI) - do not check if billed separately X- 1 5 Vital Signs [name], [name] ([id number]) Has the patient been seen at the hospital within the last three years: Yes Total Score: 120 Level Of Care: New/Established - Level 4 Electronic Signature(s) Signed: 04/14/2019 4:46:42 PM By: Montey Hora Entered By: Montey Hora on 04/14/2019 16:04:39 [name] ([id number]) -------------------------------------------------------------------------------- Encounter Discharge Information Details Patient Name: Carol Berger H. Date of Service: 04/14/2019 3:30 PM Medical Record Number: [id number] Patient Account Number: [id number] Date of Birth/Sex: [birth year] (85 y.o. F) Treating RN: Montey Hora Primary Care Theophil Thivierge: Park Liter Other Clinician: Referring Roc Streett: Park Liter Treating Rynell Ciotti/Extender: [name], [name] Weeks in Treatment: 5 Encounter Discharge Information Items Discharge Condition:  Stable Ambulatory Status: Ambulatory Discharge Destination: Home Transportation: Private Auto Accompanied By: self Schedule Follow-up Appointment: Yes Clinical Summary of Care: Electronic Signature(s) Signed: 04/14/2019 4:46:42 PM By: Montey Hora Entered By: Montey Hora on 04/14/2019 16:05:41 [name] ([id number]) -------------------------------------------------------------------------------- Lower Extremity Assessment Details Patient Name: [name], [name] Date of Service: 04/14/2019 3:30 PM Medical Record Number: [id number] Patient Account Number: [id number] Date of Birth/Sex: 11/03/1927 (85 y.o. F) Treating RN: [name] Primary Care Lyriq Jarchow: Park Liter Other Clinician: Referring Thaddaeus Granja: Park Liter Treating Jahmal Dunavant/Extender: [name], [name] Weeks in Treatment: 5 Edema Assessment Assessed: [Left: No] [Right: No] Edema: [Left: No] [Right: No] Vascular Assessment Pulses: Dorsalis Pedis Palpable: [Left:Yes] [Right:Yes] Electronic Signature(s) Signed: 04/14/2019 3:37:18 PM By: [name] Entered By: [name] on 04/14/2019 15:30:19 [place], Melia H. ([id number]) -------------------------------------------------------------------------------- Multi Wound Chart Details Patient Name: [name], [name] Date of Service: 04/14/2019 3:30 PM Medical Record Number: [id number] Patient Account Number: [id number] Date of Birth/Sex: October 14, 1927 (85 y.o. F) Treating RN: Montey Hora Primary Care Kaulana Brindle: Park Liter Other Clinician: Referring Cloud Graham: Park Liter Treating Charnette Younkin/Extender: [name], [name] Weeks in Treatment: 5 Vital Signs Height(in): 60 Pulse(bpm): 60 Weight(lbs): 84 Blood Pressure(mmHg): 133/40 Body Mass Index(BMI): 18 Temperature(F): 98.9 Respiratory Rate 16 (breaths/min): Photos: Wound Location: Left Lower Leg - Midline Right, Distal Lower Leg Right, Anterior Lower Leg Wounding Event: Trauma Trauma  Trauma Primary Etiology: Trauma, Other Skin Tear Trauma, Other Comorbid History: Cataracts, Glaucoma, Asthma, Cataracts, Glaucoma, Asthma, Cataracts, Glaucoma, Asthma, Chronic Obstructive Chronic Obstructive Chronic Obstructive Pulmonary Disease (COPD), Pulmonary Disease (COPD), Pulmonary Disease (COPD), Arrhythmia, Hypertension Arrhythmia, Hypertension Arrhythmia, Hypertension Date Acquired: 03/21/2019 04/06/2019 03/05/2019 Weeks of Treatment: 3 1 5  Wound Status: Open Healed - Epithelialized Healed - Epithelialized Measurements L x W x D 0.1x0.1x0.1 0x0x0 0x0x0 (cm) Area (cm) : 0.008 0 0 Volume (cm) : 0.001 0 0 % Reduction in Area: 99.00% 100.00% 100.00% % Reduction in Volume: 98.70%  100.00% 100.00% Classification: Unclassifiable Partial Thickness Full Thickness Without Exposed Support Structures Exudate Amount: None Present Medium Medium Exudate Type: N/A Sanguinous Serosanguineous Exudate Color: N/A red red, brown Wound Margin: Indistinct, nonvisible Flat and Intact Flat and Intact Granulation Amount: None Present (0%) None Present (0%) Large (67-100%) Granulation Quality: N/A N/A Red, Hyper-granulation Necrotic Amount: Large (67-100%) None Present (0%) Small (1-33%) Necrotic Tissue: Eschar N/A Eschar Exposed Structures: Fascia: No Fascia: No Fat Layer (Subcutaneous Fat Layer (Subcutaneous Fat Layer (Subcutaneous Tissue) Exposed: Yes Tissue) Exposed: No Tissue) Exposed: No Fascia: No Borak, Kiley H. ([id number]) Tendon: No Tendon: No Tendon: No Muscle: No Muscle: No Muscle: No Joint: No Joint: No Joint: No Bone: No Bone: No Bone: No Limited to Skin Breakdown Epithelialization: Small (1-33%) Large (67-100%) Large (67-100%) Treatment Notes Electronic Signature(s) Signed: 04/14/2019 4:46:42 PM By: Montey Hora Entered By: Montey Hora on 04/14/2019 16:03:05 Azucena, [name]  ([id number]) -------------------------------------------------------------------------------- Multi-Disciplinary Care Plan Details Patient Name: Carol Games. Date of Service: 04/14/2019 3:30 PM Medical Record Number: [id number] Patient Account Number: [id number] Date of Birth/Sex: 1928-06-03 (85 y.o. F) Treating RN: Montey Hora Primary Care Gordan Grell: Park Liter Other Clinician: Referring Rigoberto Repass: Park Liter Treating Kizzy Olafson/Extender: [name], [name] Weeks in Treatment: 5 Active Inactive Abuse / Safety / Falls / Self Care Management Nursing Diagnoses: Potential for falls Goals: Patient will not experience any injury related to falls Date Initiated: 03/10/2019 Target Resolution Date: 06/13/2019 Goal Status: Active Interventions: Assess fall risk on admission and as needed Notes: Necrotic Tissue Nursing Diagnoses: Impaired tissue integrity related to necrotic/devitalized tissue Goals: Patient/caregiver will verbalize understanding of reason and process for debridement of necrotic tissue Date Initiated: 03/10/2019 Target Resolution Date: 06/13/2019 Goal Status: Active Interventions: Provide education on necrotic tissue and debridement process Notes: Orientation to the Wound Care Program Nursing Diagnoses: Knowledge deficit related to the wound healing center program Goals: Patient/caregiver will verbalize understanding of the [facility] Date Initiated: 03/10/2019 Target Resolution Date: 06/13/2019 Goal Status: Active Interventions: Provide education on orientation to the wound center [name], [name] ([id number]) Notes: Wound/Skin Impairment Nursing Diagnoses: Impaired tissue integrity Goals: Ulcer/skin breakdown will heal within 14 weeks Date Initiated: 03/10/2019 Target Resolution Date: 06/13/2019 Goal Status: Active Interventions: Assess patient/caregiver ability to obtain necessary supplies Assess patient/caregiver ability to  perform ulcer/skin care regimen upon admission and as needed Assess ulceration(s) every visit Notes: Electronic Signature(s) Signed: 04/14/2019 4:46:42 PM By: Montey Hora Entered By: Montey Hora on 04/14/2019 16:02:56 [name], [name] ([id number]) -------------------------------------------------------------------------------- Pain Assessment Details Patient Name: Carol Games. Date of Service: 04/14/2019 3:30 PM Medical Record Number: [id number] Patient Account Number: [id number] Date of Birth/Sex: 12/13/1927 (85 y.o. F) Treating RN: [name] Primary Care Grettel Rames: Park Liter Other Clinician: Referring Catlynn Grondahl: Park Liter Treating Tobey Lippard/Extender: [name], [name] Weeks in Treatment: 5 Active Problems Location of Pain Severity and Description of Pain Patient Has Paino No Site Locations Pain Management and Medication Current Pain Management: Electronic Signature(s) Signed: 04/14/2019 3:37:18 PM By: [name] Entered By: [name] on 04/14/2019 15:24:26 [name] Lemmie Wise ([id number]) -------------------------------------------------------------------------------- Patient/Caregiver Education Details Patient Name: Carol Games. Date of Service: 04/14/2019 3:30 PM Medical Record Number: [id number] Patient Account Number: [id number] Date of Birth/Gender: 1927/11/04 (85 y.o. F) Treating RN: Montey Hora Primary Care Physician: Park Liter Other Clinician: Referring Physician: Park Liter Treating Physician/Extender: Sharalyn Ink in Treatment: 5 Education Assessment Education Provided To: Patient Education Topics Provided Wound/Skin Impairment: Handouts: Other: wound care s ordered Methods: Demonstration, Explain/Verbal Responses: State  content correctly Electronic Signature(s) Signed: 04/14/2019 4:46:42 PM By: Montey Hora Entered By: Montey Hora on 04/14/2019 16:04:55 Gruenberg, Robert Lemmie Wise  ([id number]) -------------------------------------------------------------------------------- Wound Assessment Details Patient Name: Reasor, Linzie H. Date of Service: 04/14/2019 3:30 PM Medical Record Number: [id number] Patient Account Number: [id number] Date of Birth/Sex: 1928/06/16 (85 y.o. F) Treating RN: [name] Primary Care Majed Pellegrin: Park Liter Other Clinician: Referring Jerre Vandrunen: Park Liter Treating Shelda Truby/Extender: [name], [name] Weeks in Treatment: 5 Wound Status Wound Number: 13 Primary Venous Leg Ulcer Etiology: Wound Location: Left Lower Leg - Midline Secondary Trauma, Other Wounding Event: Trauma Etiology: Date Acquired: 03/21/2019 Wound Open Weeks Of Treatment: 3 Status: Clustered Wound: No Comorbid Cataracts, Glaucoma, Asthma, Chronic History: Obstructive Pulmonary Disease (COPD), Arrhythmia, Hypertension Photos Wound Measurements Length: (cm) 0.1 % Reduction Width: (cm) 0.1 % Reduction Depth: (cm) 0.1 Epithelializ Area: (cm) 0.008 Tunneling: Volume: (cm) 0.001 Undermining in Area: 99% in Volume: 98.7% ation: Small (1-33%) No : No Wound Description Classification: Unclassifiable Foul Odor Af Wound Margin: Indistinct, nonvisible Slough/Fibri Exudate Amount: None Present ter Cleansing: No no No Wound Bed Granulation Amount: None Present (0%) Exposed Structure Necrotic Amount: Large (67-100%) Fascia Exposed: No Necrotic Quality: Eschar Fat Layer (Subcutaneous Tissue) Exposed: No Tendon Exposed: No Muscle Exposed: No Joint Exposed: No Bone Exposed: No Treatment Notes Roeper, Amita H. ([id number]) Wound #13 (Left, Midline Lower Leg) Notes prisma, non adherent pad, conform and netting Electronic Signature(s) Signed: 04/20/2019 1:55:44 PM By: Gretta Cool, BSN, RN, CWS, Kim RN, BSN Signed: 04/20/2019 2:15:02 PM By: [name] Previous Signature: 04/14/2019 3:37:18 PM Version By: [name] Entered By: Gretta Cool BSN, RN, CWS, Kim on  04/20/2019 13:55:44 Abaya, Noele H. ([id number]) -------------------------------------------------------------------------------- Wound Assessment Details Patient Name: Seneca, Kiran H. Date of Service: 04/14/2019 3:30 PM Medical Record Number: [id number] Patient Account Number: [id number] Date of Birth/Sex: 1928/05/11 (85 y.o. F) Treating RN: Montey Hora Primary Care Asalee Barrette: Park Liter Other Clinician: Referring Nedim Oki: Park Liter Treating Ilithyia Titzer/Extender: [name], [name] Weeks in Treatment: 5 Wound Status Wound Number: 14 Primary Skin Tear Etiology: Wound Location: Right, Distal Lower Leg Wound Healed - Epithelialized Wounding Event: Trauma Status: Date Acquired: 04/06/2019 Comorbid Cataracts, Glaucoma, Asthma, Chronic Weeks Of Treatment: 1 History: Obstructive Pulmonary Disease (COPD), Clustered Wound: No Arrhythmia, Hypertension Photos Wound Measurements Length: (cm) 0 % Reductio Width: (cm) 0 % Reductio Depth: (cm) 0 Epithelial Area: (cm) 0 Tunneling Volume: (cm) 0 Undermini n in Area: 100% n in Volume: 100% ization: Large (67-100%) : No ng: No Wound Description Classification: Partial Thickness Foul Odor Wound Margin: Flat and Intact Slough/Fib Exudate Amount: Medium Exudate Type: Sanguinous Exudate Color: red After Cleansing: No rino No Wound Bed Granulation Amount: None Present (0%) Exposed Structure Necrotic Amount: None Present (0%) Fascia Exposed: No Fat Layer (Subcutaneous Tissue) Exposed: No Tendon Exposed: No Muscle Exposed: No Joint Exposed: No Bone Exposed: No Limited to Skin SYLVESTER, SALONGA ([id number]) Electronic Signature(s) Signed: 04/14/2019 4:46:42 PM By: Montey Hora Previous Signature: 04/14/2019 3:37:18 PM Version By: [name] Entered By: Montey Hora on 04/14/2019 16:00:56 Ware, Nakeitha H.  ([id number]) -------------------------------------------------------------------------------- Wound Assessment Details Patient Name: Fristoe, Ashyla H. Date of Service: 04/14/2019 3:30 PM Medical Record Number: [id number] Patient Account Number: [id number] Date of Birth/Sex: May 17, 1928 (85 y.o. F) Treating RN: Montey Hora Primary Care Sherrine Salberg: Park Liter Other Clinician: Referring Rocio Wolak: Park Liter Treating Emmarose Klinke/Extender: [name], [name] Weeks in Treatment: 5 Wound Status Wound Number: 8 Primary Trauma, Other Etiology: Wound Location: Right, Anterior Lower Leg Wound Healed - Epithelialized Wounding Event:  Trauma Status: Date Acquired: 03/05/2019 Comorbid Cataracts, Glaucoma, Asthma, Chronic Weeks Of Treatment: 5 History: Obstructive Pulmonary Disease (COPD), Clustered Wound: No Arrhythmia, Hypertension Photos Wound Measurements Length: (cm) 0 % Reduct Width: (cm) 0 % Reduct Depth: (cm) 0 Epitheli Area: (cm) 0 Tunneli Volume: (cm) 0 Undermi ion in Area: 100% ion in Volume: 100% alization: Large (67-100%) ng: No ning: No Wound Description Full Thickness Without Exposed Support Foul Odo Classification: Structures Slough/F Wound Margin: Flat and Intact Exudate Medium Amount: Exudate Type: Serosanguineous Exudate Color: red, brown r After Cleansing: No ibrino Yes Wound Bed Granulation Amount: Large (67-100%) Exposed Structure Granulation Quality: Red, Hyper-granulation Fascia Exposed: No Necrotic Amount: Small (1-33%) Fat Layer (Subcutaneous Tissue) Exposed: Yes Necrotic Quality: Eschar Tendon Exposed: No Muscle Exposed: No Joint Exposed: No Bone Exposed: No ELYSABETH, AUST ([id number]) Electronic Signature(s) Signed: 04/14/2019 4:46:42 PM By: Montey Hora Previous Signature: 04/14/2019 3:37:18 PM Version By: [name] Entered By: Montey Hora on 04/14/2019 16:00:57 Philemon, [place].  ([id number]) -------------------------------------------------------------------------------- Vitals Details Patient Name: Carol Berger H. Date of Service: 04/14/2019 3:30 PM Medical Record Number: [id number] Patient Account Number: [id number] Date of Birth/Sex: 04/15/1928 (85 y.o. F) Treating RN: [name] Primary Care Chancey Ringel: Park Liter Other Clinician: Referring Joycelynn Fritsche: Park Liter Treating Tayen Narang/Extender: [name], [name] Weeks in Treatment: 5 Vital Signs Time Taken: 15:24 Temperature (F): 98.9 Height (in): 60 Pulse (bpm): 60 Weight (lbs): 92 Respiratory Rate (breaths/min): 16 Body Mass Index (BMI): 18 Blood Pressure (mmHg): 133/40 Reference Range: 80 - 120 mg / dl Electronic Signature(s) Signed: 04/14/2019 3:37:18 PM By: [name] Entered By: [name] on 04/14/2019 15:25:01

## 2019-04-22 NOTE — Progress Notes (Signed)
[name], [name] ([id number]) Visit Report for 04/21/2019 Arrival Information Details Patient Name: [name], [name]. Date of Service: 04/21/2019 3:15 PM Medical Record Number: [id number] Patient Account Number: [id number] Date of Birth/Sex: [birth year] (85 y.o. F) Treating RN: [name] Primary Care [name]: [name] Other Clinician: Referring Koray Soter: [name] Treating Scout Guyett/Extender: [name], [name] [name] in Treatment: 6 Visit Information History Since Last Visit Added or deleted any medications: No Patient Arrived: Ambulatory Any new allergies or adverse reactions: No Arrival Time: 15:17 Had a fall or experienced change in No Accompanied By: self activities of daily living that may affect Transfer Assistance: None risk of falls: Patient Identification Verified: Yes Signs or symptoms of abuse/neglect since last visito No Secondary Verification Process Completed: Yes Hospitalized since last visit: No Patient Requires Transmission-Based No Has Dressing in Place as Prescribed: Yes Precautions: Pain Present Now: No Patient Has Alerts: No Electronic Signature(s) Signed: 04/21/2019 5:10:56 PM By: [name] Entered By: [name] on 04/21/2019 15:17:34 [name] ([id number]) -------------------------------------------------------------------------------- Clinic Level of Care Assessment Details Patient Name: [name], [name]. Date of Service: 04/21/2019 3:15 PM Medical Record Number: [id number] Patient Account Number: [id number] Date of Birth/Sex: 1928/07/02 (85 y.o. F) Treating RN: [name] Primary Care [name]: [name] Other Clinician: Referring Latesia Norrington: [name] Treating [name]/Extender: [name], [name] [name] in Treatment: 6 Clinic Level of Care Assessment Items TOOL 4 Quantity Score []  - Use when only an EandM is performed on FOLLOW-UP visit 0 ASSESSMENTS - Nursing Assessment / Reassessment X -  Reassessment of Co-morbidities (includes updates in patient status) 1 10 X- 1 5 Reassessment of Adherence to Treatment Plan ASSESSMENTS - Wound and Skin Assessment / Reassessment X - Simple Wound Assessment / Reassessment - one wound 1 5 []  - 0 Complex Wound Assessment / Reassessment - multiple wounds []  - 0 Dermatologic / Skin Assessment (not related to wound area) ASSESSMENTS - Focused Assessment []  - Circumferential Edema Measurements - multi extremities 0 []  - 0 Nutritional Assessment / Counseling / Intervention X- 1 5 Lower Extremity Assessment (monofilament, tuning fork, pulses) []  - 0 Peripheral Arterial Disease Assessment (using hand held doppler) ASSESSMENTS - Ostomy and/or Continence Assessment and Care []  - Incontinence Assessment and Management 0 []  - 0 Ostomy Care Assessment and Management (repouching, etc.) PROCESS - Coordination of Care X - Simple Patient / Family Education for ongoing care 1 15 []  - 0 Complex (extensive) Patient / Family Education for ongoing care X- 1 10 Staff obtains Programmer, systems, Records, Test Results / Process Orders []  - 0 Staff telephones HHA, Nursing Homes / Clarify orders / etc []  - 0 Routine Transfer to another Facility (non-emergent condition) []  - 0 Routine Hospital Admission (non-emergent condition) []  - 0 New Admissions / Biomedical engineer / Ordering NPWT, Apligraf, etc. []  - 0 Emergency Hospital Admission (emergent condition) X- 1 10 Simple Discharge Coordination Ruta, Jeronda H. ([id number]) []  - 0 Complex (extensive) Discharge Coordination PROCESS - Special Needs []  - Pediatric / Minor Patient Management 0 []  - 0 Isolation Patient Management []  - 0 Hearing / Language / Visual special needs []  - 0 Assessment of Community assistance (transportation, D/C planning, etc.) []  - 0 Additional assistance / Altered mentation []  - 0 Support Surface(s) Assessment (bed, cushion, seat, etc.) INTERVENTIONS - Wound Cleansing  / Measurement X - Simple Wound Cleansing - one wound 1 5 []  - 0 Complex Wound Cleansing - multiple wounds X- 1 5 Wound Imaging (photographs - any number of wounds) []  - 0 Wound  Tracing (instead of photographs) X- 1 5 Simple Wound Measurement - one wound []  - 0 Complex Wound Measurement - multiple wounds INTERVENTIONS - Wound Dressings []  - Small Wound Dressing one or multiple wounds 0 []  - 0 Medium Wound Dressing one or multiple wounds []  - 0 Large Wound Dressing one or multiple wounds []  - 0 Application of Medications - topical []  - 0 Application of Medications - injection INTERVENTIONS - Miscellaneous []  - External ear exam 0 []  - 0 Specimen Collection (cultures, biopsies, blood, body fluids, etc.) []  - 0 Specimen(s) / Culture(s) sent or taken to Lab for analysis []  - 0 Patient Transfer (multiple staff / Civil Service fast streamer / Similar devices) []  - 0 Simple Staple / Suture removal (25 or less) []  - 0 Complex Staple / Suture removal (26 or more) []  - 0 Hypo / Hyperglycemic Management (close monitor of Blood Glucose) []  - 0 Ankle / Brachial Index (ABI) - do not check if billed separately X- 1 5 Vital Signs Ludwick, Kimyah H. ([id number]) Has the patient been seen at the hospital within the last three years: Yes Total Score: 80 Level Of Care: New/Established - Level 3 Electronic Signature(s) Signed: 04/21/2019 5:30:55 PM By: [name] Entered By: [name] on 04/21/2019 15:39:56 [name], [name] ([id number]) -------------------------------------------------------------------------------- Encounter Discharge Information Details Patient Name: Carol Berger H. Date of Service: 04/21/2019 3:15 PM Medical Record Number: [id number] Patient Account Number: [id number] Date of Birth/Sex: [birth year] (85 y.o. F) Treating RN: [name] Primary Care Kortney Potvin: [name] Other Clinician: Referring [name]: [name] Treating [name]/Extender: [name],  [name] [name] in Treatment: 6 Encounter Discharge Information Items Discharge Condition: Stable Ambulatory Status: Ambulatory Discharge Destination: Home Transportation: Private Auto Accompanied By: self Schedule Follow-up Appointment: No Clinical Summary of Care: Electronic Signature(s) Signed: 04/21/2019 5:30:55 PM By: [name] Entered By: [name] on 04/21/2019 15:41:05 Farro, Rhyan Lemmie Wise ([id number]) -------------------------------------------------------------------------------- Lower Extremity Assessment Details Patient Name: [name], [name] Date of Service: 04/21/2019 3:15 PM Medical Record Number: [id number] Patient Account Number: [id number] Date of Birth/Sex: 01/05/1928 (85 y.o. F) Treating RN: [name] Primary Care Yong Wahlquist: [name] Other Clinician: Referring Kathalene Sporer: [name] Treating Abbegail Matuska/Extender: [name], [name] [name] in Treatment: 6 Vascular Assessment Pulses: Dorsalis Pedis Palpable: [Left:Yes] [Right:Yes] Posterior Tibial Palpable: [Left:Yes] [Right:Yes] Electronic Signature(s) Signed: 04/21/2019 5:10:56 PM By: [name] Entered By: [name] on 04/21/2019 15:22:18 Locken, [name] ([id number]) -------------------------------------------------------------------------------- Multi-Disciplinary Care Plan Details Patient Name: Carol Games. Date of Service: 04/21/2019 3:15 PM Medical Record Number: [id number] Patient Account Number: [id number] Date of Birth/Sex: 1927/11/18 (85 y.o. F) Treating RN: [name] Primary Care Ranya Fiddler: [name] Other Clinician: Referring Chanse Kagel: [name] Treating Arushi Partridge/Extender: [name], [name] [name] in Treatment: 6 Active Inactive Electronic Signature(s) Signed: 04/21/2019 5:30:55 PM By: [name] Entered By: [name] on 04/21/2019 15:26:58 Kyllo, [name]  ([id number]) -------------------------------------------------------------------------------- Pain Assessment Details Patient Name: [name], [name] Date of Service: 04/21/2019 3:15 PM Medical Record Number: [id number] Patient Account Number: [id number] Date of Birth/Sex: 07/10/1928 (85 y.o. F) Treating RN: [name] Primary Care Holle Sprick: [name] Other Clinician: Referring Maurisa Tesmer: [name] Treating [employer]/Extender: [name], [name] [name] in Treatment: 6 Active Problems Location of Pain Severity and Description of Pain Patient Has Paino No Site Locations Pain Management and Medication Current Pain Management: Electronic Signature(s) Signed: 04/21/2019 5:10:56 PM By: [name] Entered By: [name] on 04/21/2019 15:18:09 Lape, [name] ([id number]) -------------------------------------------------------------------------------- Patient/Caregiver Education Details Patient Name: Carol Games. Date of Service: 04/21/2019 3:15  PM Medical Record Number: [id number] Patient Account Number: [id number] Date of Birth/Gender: 01-07-1928 (85 y.o. F) Treating RN: [name] Primary Care Physician: [name] Other Clinician: Referring Physician: Park Wise Treating Physician/Extender: Sharalyn Ink in Treatment: 6 Education Assessment Education Provided To: Patient Education Topics Provided Basic Hygiene: Handouts: Other: care of newly healed wound site Methods: Explain/Verbal Responses: State content correctly Electronic Signature(s) Signed: 04/21/2019 5:30:55 PM By: [name] Entered By: [name] on 04/21/2019 15:40:47 Nanda, Chaunte H. ([id number]) -------------------------------------------------------------------------------- Wound Assessment Details Patient Name: [name], [name] Date of Service: 04/21/2019 3:15 PM Medical Record Number: [id number] Patient Account Number: [id number] Date of  Birth/Sex: 1928-05-26 (85 y.o. F) Treating RN: [name] Primary Care Angelynn Lemus: [name] Other Clinician: Referring Lonetta Blassingame: [name] Treating Bayli Quesinberry/Extender: [name], [name] [name] in Treatment: 6 Wound Status Wound Number: 13 Primary Venous Leg Ulcer Etiology: Wound Location: Left, Midline Lower Leg Secondary Trauma, Other Wounding Event: Trauma Etiology: Date Acquired: 03/21/2019 Wound Healed - Epithelialized [name] Of Treatment: 4 Status: Clustered Wound: No Comorbid Cataracts, Glaucoma, Asthma, Chronic History: Obstructive Pulmonary Disease (COPD), Arrhythmia, Hypertension Photos Wound Measurements Length: (cm) 0 % Reduct Width: (cm) 0 % Reduct Depth: (cm) 0 Epitheli Area: (cm) 0 Tunneli Volume: (cm) 0 Undermi ion in Area: 100% ion in Volume: 100% alization: Medium (34-66%) ng: No ning: No Wound Description Classification: Unclassifiable Wound Margin: Indistinct, nonvisible Exudate Amount: None Present Foul Odor After Cleansing: No Slough/Fibrino No Wound Bed Granulation Amount: None Present (0%) Exposed Structure Necrotic Amount: Large (67-100%) Fascia Exposed: No Necrotic Quality: Eschar Fat Layer (Subcutaneous Tissue) Exposed: No Tendon Exposed: No Muscle Exposed: No Joint Exposed: No Bone Exposed: No Electronic Signature(s) [name], [name] ([id number]) Signed: 04/21/2019 5:30:55 PM By: [name] Entered By: [name] on 04/21/2019 15:26:19 Gallus, [name] ([id number]) -------------------------------------------------------------------------------- Vitals Details Patient Name: Carol Berger H. Date of Service: 04/21/2019 3:15 PM Medical Record Number: [id number] Patient Account Number: [id number] Date of Birth/Sex: [birth year] (85 y.o. F) Treating RN: [name] Primary Care Phoenix Riesen: [name] Other Clinician: Referring Neftali Thurow: [name] Treating Pasty Manninen/Extender: [name], [name] [name] in  Treatment: 6 Vital Signs Time Taken: 15:15 Temperature (F): 98.8 Height (in): 60 Pulse (bpm): 63 Weight (lbs): 92 Respiratory Rate (breaths/min): 18 Body Mass Index (BMI): 18 Blood Pressure (mmHg): 111/98 Reference Range: 80 - 120 mg / dl Electronic Signature(s) Signed: 04/21/2019 5:10:56 PM By: [name] Entered By: [name] on 04/21/2019 15:19:08

## 2019-04-28 ENCOUNTER — Encounter: Payer: Medicare Other | Admitting: Physician Assistant

## 2019-04-28 ENCOUNTER — Other Ambulatory Visit: Payer: Self-pay

## 2019-04-28 DIAGNOSIS — S51011A Laceration without foreign body of right elbow, initial encounter: Secondary | ICD-10-CM | POA: Diagnosis not present

## 2019-04-28 DIAGNOSIS — I10 Essential (primary) hypertension: Secondary | ICD-10-CM | POA: Diagnosis not present

## 2019-04-28 DIAGNOSIS — I4891 Unspecified atrial fibrillation: Secondary | ICD-10-CM | POA: Diagnosis not present

## 2019-04-28 DIAGNOSIS — S81802A Unspecified open wound, left lower leg, initial encounter: Secondary | ICD-10-CM | POA: Diagnosis not present

## 2019-04-28 DIAGNOSIS — Z87891 Personal history of nicotine dependence: Secondary | ICD-10-CM | POA: Diagnosis not present

## 2019-04-28 DIAGNOSIS — J449 Chronic obstructive pulmonary disease, unspecified: Secondary | ICD-10-CM | POA: Diagnosis not present

## 2019-04-28 DIAGNOSIS — L97812 Non-pressure chronic ulcer of other part of right lower leg with fat layer exposed: Secondary | ICD-10-CM | POA: Diagnosis not present

## 2019-04-28 DIAGNOSIS — S51811A Laceration without foreign body of right forearm, initial encounter: Secondary | ICD-10-CM | POA: Diagnosis not present

## 2019-04-28 DIAGNOSIS — I872 Venous insufficiency (chronic) (peripheral): Secondary | ICD-10-CM | POA: Diagnosis not present

## 2019-04-28 NOTE — Progress Notes (Addendum)
Carol, Wise ([id number]) Visit Report for 04/28/2019 Chief Complaint Document Details Patient Name: [name], [name]. Date of Service: 04/28/2019 11:30 AM Medical Record Number: [id number] Patient Account Number: [id number] Date of Birth/Sex: [birth year] (85 y.o. F) Treating RN: Montey Hora Primary Care Provider: Park Liter Other Clinician: Referring Provider: Park Liter Treating Provider/Extender: [name], Jenisha Faison Weeks in Treatment: 7 Information Obtained from: Patient Chief Complaint Right elbow skin tear Electronic Signature(s) Signed: 04/28/2019 11:42:47 AM By: Worthy Keeler PA-C Entered By: Worthy Keeler on 04/28/2019 11:42:46 Yohn, Majesta H. ([id number]) -------------------------------------------------------------------------------- HPI Details Patient Name: [name]. Date of Service: 04/28/2019 11:30 AM Medical Record Number: [id number] Patient Account Number: [id number] Date of Birth/Sex: 10-20-1927 (85 y.o. F) Treating RN: Montey Hora Primary Care Provider: Park Liter Other Clinician: Referring Provider: Park Liter Treating Provider/Extender: [name], Dejane Scheibe Weeks in Treatment: 7 History of Present Illness HPI Description: 02/12/18 ADMISSION This is an 85 year old woman who is recently moved to [place] from [place]. Her story began in February where she fell on some ice suffering extensive lacerations to her bilateral lower extremities. She is able to show me extensive pictures of the right lower leg but with going to a wound care center and [place] 3 times a week these eventually closed over. She has been left with 1 area on the upper lateral left calf. She has been applying Neosporin to this and applying a bandage. Currently this measures 2 x 1.5 cm. The patient is not a diabetic. She is an ex-smoker quitting 40 years ago. She does have COPD by description. ABIs in our clinic were 1.32 on the right and 1.03 on the  left 02/19/18; right lower leg wound which was initially trauma. The area that we look that last week is smaller. Still covered in a nonviable surface however. We are using Iodoflex 02/26/18; right lower leg wound which was initially trauma in the setting of chronic venous insufficiency. Surface of the wound looks much better healthy granulation advancing epithelialization. We have good edema control 03/05/18; right lower leg wound which was initially trauma in the setting of chronic venous insufficiency. She continues to make nice progress here. We have good edema control oHe arrives today with a new traumatic laceration on her right dorsal forearm which she states happened while she was putting on a sweatshirt 03/12/18; right lower leg wound as closed however it still looks vulnerable. She has chronic venous insufficiency. oThe new wound from last week a traumatic area on her right dorsal or arm unfortunately does not have a viable surface. I remove some nonviable skin and necrotic subcutaneous debris from the wound surface. Hemostasis with silver nitrate and direct pressure 03/19/18; right lower leg wound is closed. She has chronic venous insufficiency and will need ongoing compression oThe new wounds from 2 weeks ago on the right upper elbow is closed. The area on her right dorsal forearm continues to have a nonviable surface requiring debridement 03/26/18; we'll need to look into ongoing compression for her legs. 2 wounds on the right upper elbow is closed. The area on the right dorsal forearm looks better. Hydrofera Blue secondary to hypertrophic granulation 04/02/18; he has compression stockings although she did not wear them today. Severe chronic venous insufficiency. She has 2 wounds on the right upper elbow which I said were closed last week which actually are although they're very small and she has a smaller clean wound on the right dorsal forearm. We've been using Hydrofera Blue secondary to  some  upper granulation. All of this looks better 04/09/18-She is seen in follow up evaluation for a right elbow and right forearm skin tear. The right elbow is healed. We will continue with same treatment plan and she'll follow next week 04/16/18-She is seen in follow-up evaluation for right forearm skin tear, this is essentially healed. We will cover with foam border and she will follow-up next week 04/23/18; the patient arrives with a new skin tear on her right dorsal arm just below the elbow. She got this while carrying a box. She has a linear skin tear. There is no depth I don't think this should've been sutured. There is a skin flap medially I'm not sure if this will be viable or not 04/30/18; the new skin tear from last week unfortunately has remained viable in terms of the skin flap. She has a small open area that looks healthy. Using moistened silver collagen 05/07/18; right dorsal arm skin tear. Nonviable tissue over the remnants of the wound. We've been using silver collagen 05/21/18; right dorsal arm skin tear. Nonviable tissue over a small remnant of the wound was washed off. We've been using silver collagen which we will continue 06/04/18; the right dorsal arm skin tear has healed. She arrives today with a traumatic wound just above the olecranon of her left elbow. This is a skin tear with a nonviable nonadherent flap which was removed. We'll use silver collagen here. Also noteworthy she arrives without her compression stockings 06/18/18; the area just above the olecranon of her left elbow. It is smaller and generally has a healthy surface. I was surprised Carol, Wise. ([id number]) to learn that the patient is actually changing this herself although. Appears this week she was putting some topical lidocaine on this that she bought over-the-counter at the drugstore. She did get supplies at home she didn't know how to put it on. She drove herself here today i.e. not eligible for home  health Readmission: 11/04/18 on evaluation today patient presents for evaluation our clinic news to include the she has on her lower extremity which occurred as the result of having hit this on a piece of furniture. This was back in December. She states that her daughter has been trying to get her to come into have this evaluated here the wound care center but she was being somewhat stubborn. Nonetheless upon evaluation today the wound really appears to be potentially healed she does have some swelling of lower extremity which I think will come into play. With that being said he tells me that even yesterday this was still making clear fluid and therefore there may be some issues with continued problems with the swelling and drainage secondary to her venous stasis. Nonetheless overall wound appears to be doing fairly well which is good news. 11/11/18 on evaluation today patient actually appears to be doing excellent in the area of question that was green just the day before he saw her last time actually appears to have not drain that always in past week. I think this is done very well and at this point though she's had some discomfort I do not see any evidence of open wound which he needs to continue see wound care. Readmission: 03/10/19 on evaluation today patient presents for reevaluation here in our clinic concerning issues that she actually is having with the new problem on her right lower leg due to trauma that occurred when a car door shut on her leg last Thursday. This is not been quite a  week and she does have several skin tears unfortunately that resulted from this injury. Fortunately there's no signs of active infection at this time. No fevers, chills, nausea, or vomiting noted at this time. She does have some hot tissue noted at this point that has me concerned about the possibility of needing to move this in order to allow the areas to heal appropriately. Fortunately there's no signs of  active infection at this time. 03/17/19-Patient returns to clinic at 1 week, we have been using Prisma on the larger wounds with Kerlix and Xeroform. As many as 5 wounds were number last time 3 of which have closed, leaving the right proximal tibial and right distal medial leg wound both of which are looking good 03/24/19 on evaluation today patient appears to be doing better in regard to her wounds at this time. Fortunately there is no sign of active infection at this time. No fevers chills noted. She's been tolerating the dressing changes without complication which is good news she's been performing these herself it sounds like home health is not going to be coming out it sounds as if they feel like she may be able to take care of yourself and not be technically homebound. Nonetheless though that is unfortunate it does seem like she's done fairly well with taking care of yourself from the standpoint of the dressings. 03/31/19 on evaluation today patient actually appears to be doing well with regard to her lower extremity ulcers. She had one area to heal today and in regard to her right lower extremity were she continues to have an open wound I did have to perform some sharp debridement today. Subsequently the patient still has an area of what appears to be possibly a small hematoma on the anterior portion of her left lower extremity. This is not open enough at this time but nonetheless does seem to show some evidence of hardening which is not necessarily a bad thing. Eventually this may pop off but again I'm not interested in really removing this prematurely. 04/07/19 on evaluation today patient actually appears to be doing well with regard to her lower extremity ulcers. She is been tolerating the dressing changes without complication. She seems to be making excellent progress which is great news. She does have one small new skin tear area but the good news is she was able to pull the skin back over  and it really looks like it is healing quite nicely. Overall that is just a very small region and again seems to be healing nicely. 04/14/19 upon evaluation today patient actually appears to be doing quite well with regard to the wounds on the right lower extremity. In fact everything appears to be healed as of today. This is excellent news. She does however have the area on the left lower extremity which is showing some signs of loosening up we have been watching this area it is no longer fluctuant and in fact with the Betadine on it it is been slowly dry now but is now lifting up on the edge. There may be a wound underneath this region. 04/21/2019 on evaluation today patient appears to be doing excellent in regard to her bilateral lower extremities. In fact she has been tolerating the dressing changes without complication does not seem to show any signs of infection at this point. In fact based on what I am seeing currently I think she is completely healed. HATICE, DALOIA ([id number]) 04/28/2019 On evaluation today patient presents for follow-up concerning  an injury to her right elbow. She actually comes into the office today and she was somewhat upset by the fact that her elbow seems to be doing more poorly after she hit it on the wall. This does not appear to be too bad in fact there just seems to be a small area where obviously there was a skin tear but the skin actually is laying back over the region. In fact I think that is probably can heal quite nicely and reattach without any complication. Fortunately there is no signs of active infection at this time. She is not having any significant pain at this point. She is very upset however especially since we discharge with her legs just a week ago. Electronic Signature(s) Signed: 05/22/2019 9:00:07 AM By: Worthy Keeler PA-C Previous Signature: 05/22/2019 8:52:55 AM Version By: Worthy Keeler PA-C Entered By: Worthy Keeler on 05/22/2019  09:00:07 AVALENA, GUSTUS ([id number]) -------------------------------------------------------------------------------- Physical Exam Details Patient Name: Dartt, Eugenia H. Date of Service: 04/28/2019 11:30 AM Medical Record Number: [id number] Patient Account Number: [id number] Date of Birth/Sex: 1928/08/31 (85 y.o. F) Treating RN: Montey Hora Primary Care Provider: Park Liter Other Clinician: Referring Provider: Park Liter Treating Provider/Extender: [name], Daiwik Buffalo Weeks in Treatment: 7 Constitutional Well-nourished and well-hydrated in no acute distress. Respiratory normal breathing without difficulty. clear to auscultation bilaterally. Cardiovascular regular rate and rhythm with normal S1, S2. Psychiatric this patient is able to make decisions and demonstrates good insight into disease process. Alert and Oriented x 3. pleasant and cooperative. Notes Patient's wound bed currently actually appears to be doing quite well there is no signs of any significant infection obviously there is evidence of the skin tear which pulled back but she is actually laid this back over and it approximates in an excellent fashion. I think this is likely getting heal very well since to be honest it is attached to back to the point that the wound edges approximate perfectly and again there does not even appear to be any bleeding or seeping from the area. Electronic Signature(s) Signed: 05/22/2019 9:00:52 AM By: Worthy Keeler PA-C Entered By: Worthy Keeler on 05/22/2019 09:00:52 KAMILAH, BEIER ([id number]) -------------------------------------------------------------------------------- Physician Orders Details Patient Name: [name]. Date of Service: 04/28/2019 11:30 AM Medical Record Number: [id number] Patient Account Number: [id number] Date of Birth/Sex: 1927-11-01 (85 y.o. F) Treating RN: Montey Hora Primary Care Provider: Park Liter Other  Clinician: Referring Provider: Park Liter Treating Provider/Extender: [name], Lamaria Hildebrandt Weeks in Treatment: 7 Verbal / Phone Orders: No Diagnosis Coding ICD-10 Coding Code Description I87.2 Venous insufficiency (chronic) (peripheral) S51.001A Unspecified open wound of right elbow, initial encounter Wound Cleansing Wound #15 Right Elbow o May shower with protection. Primary Wound Dressing Wound #15 Right Elbow o Other: - steri strips to remain in place this week Secondary Dressing Wound #15 Right Elbow o ABD and Kerlix/Conform - secure with netting Dressing Change Frequency Wound #15 Right Elbow o Three times weekly Follow-up Appointments Wound #15 Right Elbow o Return Appointment in 1 week. Electronic Signature(s) Signed: 04/28/2019 4:25:23 PM By: Montey Hora Signed: 04/29/2019 11:36:49 PM By: Worthy Keeler PA-C Entered By: Montey Hora on 04/28/2019 11:47:30 Justin, Elham HMarland Kitchen ([id number]) -------------------------------------------------------------------------------- Problem List Details Patient Name: Mcphail, Maanasa H. Date of Service: 04/28/2019 11:30 AM Medical Record Number: [id number] Patient Account Number: [id number] Date of Birth/Sex: 05/31/1928 (85 y.o. F) Treating RN: Montey Hora Primary Care Provider: Park Liter Other Clinician: Referring Provider: Park Liter Treating Provider/Extender: [name], Dorse Locy  Weeks in Treatment: 7 Active Problems ICD-10 Evaluated Encounter Code Description Active Date Today Diagnosis I87.2 Venous insufficiency (chronic) (peripheral) 03/10/2019 No Yes S51.001A Unspecified open wound of right elbow, initial encounter 04/28/2019 No Yes Inactive Problems Resolved Problems ICD-10 Code Description Active Date Resolved Date L97.812 Non-pressure chronic ulcer of other part of right lower leg with fat 03/10/2019 03/10/2019 layer exposed Electronic Signature(s) Signed: 04/28/2019 11:42:33 AM By: Worthy Keeler  PA-C Entered By: Worthy Keeler on 04/28/2019 11:42:33 Althouse, Teriann H. ([id number]) -------------------------------------------------------------------------------- Progress Note Details Patient Name: Dun, Javen H. Date of Service: 04/28/2019 11:30 AM Medical Record Number: [id number] Patient Account Number: [id number] Date of Birth/Sex: [birth year] (85 y.o. F) Treating RN: Montey Hora Primary Care Provider: Park Liter Other Clinician: Referring Provider: Park Liter Treating Provider/Extender: [name], Jhace Fennell Weeks in Treatment: 7 Subjective Chief Complaint Information obtained from Patient Right elbow skin tear History of Present Illness (HPI) 02/12/18 ADMISSION This is an 85 year old woman who is recently moved to [place] from [place]. Her story began in February where she fell on some ice suffering extensive lacerations to her bilateral lower extremities. She is able to show me extensive pictures of the right lower leg but with going to a wound care center and [place] 3 times a week these eventually closed over. She has been left with 1 area on the upper lateral left calf. She has been applying Neosporin to this and applying a bandage. Currently this measures 2 x 1.5 cm. The patient is not a diabetic. She is an ex-smoker quitting 40 years ago. She does have COPD by description. ABIs in our clinic were 1.32 on the right and 1.03 on the left 02/19/18; right lower leg wound which was initially trauma. The area that we look that last week is smaller. Still covered in a nonviable surface however. We are using Iodoflex 02/26/18; right lower leg wound which was initially trauma in the setting of chronic venous insufficiency. Surface of the wound looks much better healthy granulation advancing epithelialization. We have good edema control 03/05/18; right lower leg wound which was initially trauma in the setting of chronic venous insufficiency. She continues to make nice  progress here. We have good edema control He arrives today with a new traumatic laceration on her right dorsal forearm which she states happened while she was putting on a sweatshirt 03/12/18; right lower leg wound as closed however it still looks vulnerable. She has chronic venous insufficiency. The new wound from last week a traumatic area on her right dorsal or arm unfortunately does not have a viable surface. I remove some nonviable skin and necrotic subcutaneous debris from the wound surface. Hemostasis with silver nitrate and direct pressure 03/19/18; right lower leg wound is closed. She has chronic venous insufficiency and will need ongoing compression The new wounds from 2 weeks ago on the right upper elbow is closed. The area on her right dorsal forearm continues to have a nonviable surface requiring debridement 03/26/18; we'll need to look into ongoing compression for her legs. 2 wounds on the right upper elbow is closed. The area on the right dorsal forearm looks better. Hydrofera Blue secondary to hypertrophic granulation 04/02/18; he has compression stockings although she did not wear them today. Severe chronic venous insufficiency. She has 2 wounds on the right upper elbow which I said were closed last week which actually are although they're very small and she has a smaller clean wound on the right dorsal forearm. We've been using Hydrofera Blue secondary  to some upper granulation. All of this looks better 04/09/18-She is seen in follow up evaluation for a right elbow and right forearm skin tear. The right elbow is healed. We will continue with same treatment plan and she'll follow next week 04/16/18-She is seen in follow-up evaluation for right forearm skin tear, this is essentially healed. We will cover with foam border and she will follow-up next week 04/23/18; the patient arrives with a new skin tear on her right dorsal arm just below the elbow. She got this while carrying a box. She  has a linear skin tear. There is no depth I don't think this should've been sutured. There is a skin flap medially I'm not sure if this will be viable or not 04/30/18; the new skin tear from last week unfortunately has remained viable in terms of the skin flap. She has a small open area that looks healthy. Using moistened silver collagen 05/07/18; right dorsal arm skin tear. Nonviable tissue over the remnants of the wound. We've been using silver collagen TZIPA, APPIAH ([id number]) 05/21/18; right dorsal arm skin tear. Nonviable tissue over a small remnant of the wound was washed off. We've been using silver collagen which we will continue 06/04/18; the right dorsal arm skin tear has healed. She arrives today with a traumatic wound just above the olecranon of her left elbow. This is a skin tear with a nonviable nonadherent flap which was removed. We'll use silver collagen here. Also noteworthy she arrives without her compression stockings 06/18/18; the area just above the olecranon of her left elbow. It is smaller and generally has a healthy surface. I was surprised to learn that the patient is actually changing this herself although. Appears this week she was putting some topical lidocaine on this that she bought over-the-counter at the drugstore. She did get supplies at home she didn't know how to put it on. She drove herself here today i.e. not eligible for home health Readmission: 11/04/18 on evaluation today patient presents for evaluation our clinic news to include the she has on her lower extremity which occurred as the result of having hit this on a piece of furniture. This was back in December. She states that her daughter has been trying to get her to come into have this evaluated here the wound care center but she was being somewhat stubborn. Nonetheless upon evaluation today the wound really appears to be potentially healed she does have some swelling of lower extremity which I think  will come into play. With that being said he tells me that even yesterday this was still making clear fluid and therefore there may be some issues with continued problems with the swelling and drainage secondary to her venous stasis. Nonetheless overall wound appears to be doing fairly well which is good news. 11/11/18 on evaluation today patient actually appears to be doing excellent in the area of question that was green just the day before he saw her last time actually appears to have not drain that always in past week. I think this is done very well and at this point though she's had some discomfort I do not see any evidence of open wound which he needs to continue see wound care. Readmission: 03/10/19 on evaluation today patient presents for reevaluation here in our clinic concerning issues that she actually is having with the new problem on her right lower leg due to trauma that occurred when a car door shut on her leg last Thursday. This is not  been quite a week and she does have several skin tears unfortunately that resulted from this injury. Fortunately there's no signs of active infection at this time. No fevers, chills, nausea, or vomiting noted at this time. She does have some hot tissue noted at this point that has me concerned about the possibility of needing to move this in order to allow the areas to heal appropriately. Fortunately there's no signs of active infection at this time. 03/17/19-Patient returns to clinic at 1 week, we have been using Prisma on the larger wounds with Kerlix and Xeroform. As many as 5 wounds were number last time 3 of which have closed, leaving the right proximal tibial and right distal medial leg wound both of which are looking good 03/24/19 on evaluation today patient appears to be doing better in regard to her wounds at this time. Fortunately there is no sign of active infection at this time. No fevers chills noted. She's been tolerating the dressing changes  without complication which is good news she's been performing these herself it sounds like home health is not going to be coming out it sounds as if they feel like she may be able to take care of yourself and not be technically homebound. Nonetheless though that is unfortunate it does seem like she's done fairly well with taking care of yourself from the standpoint of the dressings. 03/31/19 on evaluation today patient actually appears to be doing well with regard to her lower extremity ulcers. She had one area to heal today and in regard to her right lower extremity were she continues to have an open wound I did have to perform some sharp debridement today. Subsequently the patient still has an area of what appears to be possibly a small hematoma on the anterior portion of her left lower extremity. This is not open enough at this time but nonetheless does seem to show some evidence of hardening which is not necessarily a bad thing. Eventually this may pop off but again I'm not interested in really removing this prematurely. 04/07/19 on evaluation today patient actually appears to be doing well with regard to her lower extremity ulcers. She is been tolerating the dressing changes without complication. She seems to be making excellent progress which is great news. She does have one small new skin tear area but the good news is she was able to pull the skin back over and it really looks like it is healing quite nicely. Overall that is just a very small region and again seems to be healing nicely. 04/14/19 upon evaluation today patient actually appears to be doing quite well with regard to the wounds on the right lower extremity. In fact everything appears to be healed as of today. This is excellent news. She does however have the area on the left lower extremity which is showing some signs of loosening up we have been watching this area it is no longer fluctuant and in fact with the Betadine on it it is  been slowly dry now but is now lifting up on the edge. There may be a wound underneath [place], [place] ([id number]) this region. 04/21/2019 on evaluation today patient appears to be doing excellent in regard to her bilateral lower extremities. In fact she has been tolerating the dressing changes without complication does not seem to show any signs of infection at this point. In fact based on what I am seeing currently I think she is completely healed. 04/28/2019 On evaluation today patient presents  for follow-up concerning an injury to her right elbow. She actually comes into the office today and she was somewhat upset by the fact that her elbow seems to be doing more poorly after she hit it on the wall. This does not appear to be too bad in fact there just seems to be a small area where obviously there was a skin tear but the skin actually is laying back over the region. In fact I think that is probably can heal quite nicely and reattach without any complication. Fortunately there is no signs of active infection at this time. She is not having any significant pain at this point. She is very upset however especially since we discharge with her legs just a week ago. Patient History Information obtained from Patient. Family History Cancer - Siblings,Paternal Grandparents, Diabetes - Siblings, Heart Disease - Mother,Father,Child,Paternal Grandparents, Hypertension - Child, No family history of Hereditary Spherocytosis, Kidney Disease, Lung Disease, Seizures, Stroke, Thyroid Problems, Tuberculosis. Social History Former smoker - quit 40 yrs ago, Marital Status - Divorced, Alcohol Use - Never, Drug Use - No History, Caffeine Use - Rarely. Medical History Eyes Patient has history of Cataracts - surgery 2000, Glaucoma Respiratory Patient has history of Asthma, Chronic Obstructive Pulmonary Disease (COPD) Denies history of Pneumothorax, Sleep Apnea, Tuberculosis Cardiovascular Patient has  history of Arrhythmia - a-fib, Hypertension Denies history of Angina, Congestive Heart Failure, Coronary Artery Disease, Deep Vein Thrombosis, Hypotension, Myocardial Infarction, Peripheral Arterial Disease, Peripheral Venous Disease, Phlebitis, Vasculitis Endocrine Denies history of Type I Diabetes, Type II Diabetes Genitourinary Denies history of End Stage Renal Disease Immunological Denies history of Lupus Erythematosus, Raynaud s, Scleroderma Integumentary (Skin) Denies history of History of Burn, History of pressure wounds Musculoskeletal Denies history of Gout, Rheumatoid Arthritis, Osteoarthritis, Osteomyelitis Neurologic Denies history of Dementia, Neuropathy, Quadriplegia, Paraplegia, Seizure Disorder Oncologic Denies history of Received Chemotherapy, Received Radiation Psychiatric Denies history of Anorexia/bulimia, Confinement Anxiety Hospitalization/Surgery History - Colon cancer. Review of Systems (ROS) Constitutional Symptoms (General Health) Denies complaints or symptoms of Fatigue, Fever, Chills, Marked Weight Change. Respiratory Manley, Maximina H. ([id number]) Denies complaints or symptoms of Chronic or frequent coughs, Shortness of Breath. Cardiovascular Denies complaints or symptoms of Chest pain, LE edema. Psychiatric Denies complaints or symptoms of Anxiety, Claustrophobia. Objective Constitutional Well-nourished and well-hydrated in no acute distress. Vitals Time Taken: 11:34 AM, Height: 60 in, Weight: 92 lbs, BMI: 18, Temperature: 98.37 F, Pulse: 64 bpm, Respiratory Rate: 16 breaths/min, Blood Pressure: 136/45 mmHg. General Notes: Patient asymptomatic, no lightheadedness. Respiratory normal breathing without difficulty. clear to auscultation bilaterally. Cardiovascular regular rate and rhythm with normal S1, S2. Psychiatric this patient is able to make decisions and demonstrates good insight into disease process. Alert and Oriented x 3. pleasant and  cooperative. General Notes: Patient's wound bed currently actually appears to be doing quite well there is no signs of any significant infection obviously there is evidence of the skin tear which pulled back but she is actually laid this back over and it approximates in an excellent fashion. I think this is likely getting heal very well since to be honest it is attached to back to the point that the wound edges approximate perfectly and again there does not even appear to be any bleeding or seeping from the area. Integumentary (Hair, Skin) Wound #15 status is Open. Original cause of wound was Trauma. The wound is located on the Right Elbow. The wound measures 2cm length x 0.1cm width x 0.1cm depth; 0.157cm^2 area and 0.016cm^3 volume.  The wound is limited to skin breakdown. There is no tunneling or undermining noted. There is a none present amount of drainage noted. The wound margin is distinct with the outline attached to the wound base. There is no granulation within the wound bed. There is no necrotic tissue within the wound bed. Assessment Active Problems ICD-10 Venous insufficiency (chronic) (peripheral) Unspecified open wound of right elbow, initial encounter ARRIE, VILLALONA ([id number]) Plan Wound Cleansing: Wound #15 Right Elbow: May shower with protection. Primary Wound Dressing: Wound #15 Right Elbow: Other: - steri strips to remain in place this week Secondary Dressing: Wound #15 Right Elbow: ABD and Kerlix/Conform - secure with netting Dressing Change Frequency: Wound #15 Right Elbow: Three times weekly Follow-up Appointments: Wound #15 Right Elbow: Return Appointment in 1 week. 1. My suggestion currently is good to be that we go ahead and just put Steri-Strips over the area which should remain in place until she comes back to see me. 2. We will apply a protective dressing over top of this with Kerlix secured with netting in order to protect the elbow region  from getting bumped again. We will see patient back for reevaluation in 1 week here in the clinic. If anything worsens or changes patient will contact our office for additional recommendations. Electronic Signature(s) Signed: 05/22/2019 9:01:38 AM By: Worthy Keeler PA-C Entered By: Worthy Keeler on 05/22/2019 09:01:37 KEYSI, SOLIDAY ([id number]) -------------------------------------------------------------------------------- ROS/PFSH Details Patient Name: [name]. Date of Service: 04/28/2019 11:30 AM Medical Record Number: [id number] Patient Account Number: [id number] Date of Birth/Sex: 03-31-1928 (85 y.o. F) Treating RN: Montey Hora Primary Care Provider: Park Liter Other Clinician: Referring Provider: Park Liter Treating Provider/Extender: [name], Keymari Sato Weeks in Treatment: 7 Information Obtained From Patient Constitutional Symptoms (General Health) Complaints and Symptoms: Negative for: Fatigue; Fever; Chills; Marked Weight Change Respiratory Complaints and Symptoms: Negative for: Chronic or frequent coughs; Shortness of Breath Medical History: Positive for: Asthma; Chronic Obstructive Pulmonary Disease (COPD) Negative for: Pneumothorax; Sleep Apnea; Tuberculosis Cardiovascular Complaints and Symptoms: Negative for: Chest pain; LE edema Medical History: Positive for: Arrhythmia - a-fib; Hypertension Negative for: Angina; Congestive Heart Failure; Coronary Artery Disease; Deep Vein Thrombosis; Hypotension; Myocardial Infarction; Peripheral Arterial Disease; Peripheral Venous Disease; Phlebitis; Vasculitis Psychiatric Complaints and Symptoms: Negative for: Anxiety; Claustrophobia Medical History: Negative for: Anorexia/bulimia; Confinement Anxiety Eyes Medical History: Positive for: Cataracts - surgery 2000; Glaucoma Endocrine Medical History: Negative for: Type I Diabetes; Type II Diabetes Genitourinary Medical History: Negative for:  End Stage Renal Disease Beaston, Rachele H. ([id number]) Immunological Medical History: Negative for: Lupus Erythematosus; Raynaudos; Scleroderma Integumentary (Skin) Medical History: Negative for: History of Burn; History of pressure wounds Musculoskeletal Medical History: Negative for: Gout; Rheumatoid Arthritis; Osteoarthritis; Osteomyelitis Neurologic Medical History: Negative for: Dementia; Neuropathy; Quadriplegia; Paraplegia; Seizure Disorder Oncologic Medical History: Negative for: Received Chemotherapy; Received Radiation HBO Extended History Items Eyes: Eyes: Cataracts Glaucoma Immunizations Pneumococcal Vaccine: Received Pneumococcal Vaccination: Yes Implantable Devices None Hospitalization / Surgery History Type of Hospitalization/Surgery Colon cancer Family and Social History Cancer: Yes - Siblings,Paternal Grandparents; Diabetes: Yes - Siblings; Heart Disease: Yes - Mother,Father,Child,Paternal Grandparents; Hereditary Spherocytosis: No; Hypertension: Yes - Child; Kidney Disease: No; Lung Disease: No; Seizures: No; Stroke: No; Thyroid Problems: No; Tuberculosis: No; Former smoker - quit 40 yrs ago; Marital Status - Divorced; Alcohol Use: Never; Drug Use: No History; Caffeine Use: Rarely; Financial Concerns: No; Food, Clothing or Shelter Needs: No; Support System Lacking: No; Transportation Concerns: No Physician Affirmation I have reviewed and agree  with the above information. Electronic Signature(s) Signed: 05/22/2019 9:02:38 AM By: Worthy Keeler PA-C Signed: 07/22/2019 4:48:32 PM By: Montey Hora Entered By: Worthy Keeler on 05/22/2019 09:00:23 Strader, [name] ([id number]) -------------------------------------------------------------------------------- SuperBill Details Patient Name: [name] Date of Service: 04/28/2019 Medical Record Number: [id number] Patient Account Number: [id number] Date of Birth/Sex: 1928/03/19 (85 y.o.  F) Treating RN: Montey Hora Primary Care Provider: Park Liter Other Clinician: Referring Provider: Park Liter Treating Provider/Extender: [name], Shawn Dannenberg Weeks in Treatment: 7 Diagnosis Coding ICD-10 Codes Code Description I87.2 Venous insufficiency (chronic) (peripheral) S51.001A Unspecified open wound of right elbow, initial encounter Facility Procedures CPT4 Code: [id number] Description: [facility] VISIT-LEV 3 EST PT Modifier: Quantity: 1 Physician Procedures CPT4 Code: [id number] Description: [id number] - WC PHYS LEVEL 4 - EST PT ICD-10 Diagnosis Description S51.001A Unspecified open wound of right elbow, initial encounter I87.2 Venous insufficiency (chronic) (peripheral) Modifier: Quantity: 1 Electronic Signature(s) Signed: 05/22/2019 9:02:01 AM By: Worthy Keeler PA-C Previous Signature: 04/29/2019 3:10:28 PM Version By: Sharon Mt Previous Signature: 04/29/2019 11:36:49 PM Version By: Worthy Keeler PA-C Entered By: Worthy Keeler on 05/22/2019 09:02:00

## 2019-05-01 NOTE — Progress Notes (Signed)
[name], [name] ([id number]) Visit Report for 04/28/2019 Fall Risk Assessment Details Patient Name: [name], [name]. Date of Service: 04/28/2019 11:30 AM Medical Record Number: [id number] Patient Account Number: [id number] Date of Birth/Sex: 03-28-1928 (85 y.o. F) Treating RN: [name] Barman Primary Care Cyndi Montejano: Park Liter Other Clinician: Referring Annaleigha Woo: Park Liter Treating Elton Heid/Extender: [name], [name] Weeks in Treatment: 7 Fall Risk Assessment Items Have you had 2 or more falls in the last 12 monthso 0 Yes Have you had any fall that resulted in injury in the last 12 monthso 0 Yes FALLS RISK SCREEN History of falling - immediate or within 3 months 25 Yes Secondary diagnosis (Do you have 2 or more medical diagnoseso) 0 No Ambulatory aid None/bed rest/wheelchair/nurse 0 Yes Crutches/cane/walker 0 No Furniture 0 No Intravenous therapy Access/Saline/Heparin Lock 0 No Gait/Transferring Normal/ bed rest/ wheelchair 0 No Weak (short steps with or without shuffle, stooped but able to lift head while 10 Yes walking, may seek support from furniture) Impaired (short steps with shuffle, may have difficulty arising from chair, head 0 No down, impaired balance) Mental Status Oriented to own ability 0 Yes Electronic Signature(s) Signed: 05/01/2019 9:32:15 AM By: Gretta Cool, BSN, RN, CWS, Kim RN, BSN Entered By: Gretta Cool, BSN, RN, CWS, Kim on 04/28/2019 11:34:39

## 2019-05-01 NOTE — Progress Notes (Signed)
Carol, Wise ([id number]) Visit Report for 04/28/2019 Arrival Information Details Patient Name: [name], [name]. Date of Service: 04/28/2019 11:30 AM Medical Record Number: [id number] Patient Account Number: [id number] Date of Birth/Sex: 22-Jul-1928 (85 y.o. F) Treating RN: [name] Barman Primary Care Hajira Verhagen: Park Liter Other Clinician: Referring Monserrat Vidaurri: Park Liter Treating Damontre Millea/Extender: [name], [name] Weeks in Treatment: 7 Visit Information History Since Last Visit Added or deleted any medications: No Patient Arrived: Ambulatory Any new allergies or adverse reactions: No Arrival Time: 11:32 Had a fall or experienced change in Yes Accompanied By: self activities of daily living that may affect Transfer Assistance: None risk of falls: Patient Identification Verified: Yes Signs or symptoms of abuse/neglect since last visito No Secondary Verification Process Yes Hospitalized since last visit: No Completed: Pain Present Now: No Patient Requires Transmission-Based No Precautions: Patient Has Alerts: Yes Patient Alerts: Patient on Blood Thinner 81mg  aspirin Electronic Signature(s) Signed: 05/01/2019 9:32:15 AM By: Gretta Cool, BSN, RN, CWS, Kim RN, BSN Entered By: Gretta Cool, BSN, RN, CWS, Kim on 04/28/2019 11:34:11 Carol, Wise ([id number]) -------------------------------------------------------------------------------- Clinic Level of Care Assessment Details Patient Name: Carol, [name] Date of Service: 04/28/2019 11:30 AM Medical Record Number: [id number] Patient Account Number: [id number] Date of Birth/Sex: 09/12/1927 (85 y.o. F) Treating RN: Montey Hora Primary Care Saren Corkern: Park Liter Other Clinician: Referring Leelah Hanna: Park Liter Treating Brooklyne Radke/Extender: [name], [name] Weeks in Treatment: 7 Clinic Level of Care Assessment Items TOOL 4 Quantity Score []  - Use when only an EandM is performed on FOLLOW-UP visit 0 ASSESSMENTS -  Nursing Assessment / Reassessment X - Reassessment of Co-morbidities (includes updates in patient status) 1 10 X- 1 5 Reassessment of Adherence to Treatment Plan ASSESSMENTS - Wound and Skin Assessment / Reassessment X - Simple Wound Assessment / Reassessment - one wound 1 5 []  - 0 Complex Wound Assessment / Reassessment - multiple wounds []  - 0 Dermatologic / Skin Assessment (not related to wound area) ASSESSMENTS - Focused Assessment []  - Circumferential Edema Measurements - multi extremities 0 []  - 0 Nutritional Assessment / Counseling / Intervention []  - 0 Lower Extremity Assessment (monofilament, tuning fork, pulses) []  - 0 Peripheral Arterial Disease Assessment (using hand held doppler) ASSESSMENTS - Ostomy and/or Continence Assessment and Care []  - Incontinence Assessment and Management 0 []  - 0 Ostomy Care Assessment and Management (repouching, etc.) PROCESS - Coordination of Care X - Simple Patient / Family Education for ongoing care 1 15 []  - 0 Complex (extensive) Patient / Family Education for ongoing care X- 1 10 Staff obtains Programmer, systems, Records, Test Results / Process Orders []  - 0 Staff telephones HHA, Nursing Homes / Clarify orders / etc []  - 0 Routine Transfer to another Facility (non-emergent condition) []  - 0 Routine Hospital Admission (non-emergent condition) []  - 0 New Admissions / Biomedical engineer / Ordering NPWT, Apligraf, etc. []  - 0 Emergency Hospital Admission (emergent condition) X- 1 10 Simple Discharge Coordination Ding, Afsana H. ([id number]) []  - 0 Complex (extensive) Discharge Coordination PROCESS - Special Needs []  - Pediatric / Minor Patient Management 0 []  - 0 Isolation Patient Management []  - 0 Hearing / Language / Visual special needs []  - 0 Assessment of Community assistance (transportation, D/C planning, etc.) []  - 0 Additional assistance / Altered mentation []  - 0 Support Surface(s) Assessment (bed, cushion, seat,  etc.) INTERVENTIONS - Wound Cleansing / Measurement X - Simple Wound Cleansing - one wound 1 5 []  - 0 Complex Wound Cleansing - multiple wounds X- 1 5 Wound Imaging (photographs -  any number of wounds) []  - 0 Wound Tracing (instead of photographs) X- 1 5 Simple Wound Measurement - one wound []  - 0 Complex Wound Measurement - multiple wounds INTERVENTIONS - Wound Dressings X - Small Wound Dressing one or multiple wounds 1 10 []  - 0 Medium Wound Dressing one or multiple wounds []  - 0 Large Wound Dressing one or multiple wounds []  - 0 Application of Medications - topical []  - 0 Application of Medications - injection INTERVENTIONS - Miscellaneous []  - External ear exam 0 []  - 0 Specimen Collection (cultures, biopsies, blood, body fluids, etc.) []  - 0 Specimen(s) / Culture(s) sent or taken to Lab for analysis []  - 0 Patient Transfer (multiple staff / Civil Service fast streamer / Similar devices) []  - 0 Simple Staple / Suture removal (25 or less) []  - 0 Complex Staple / Suture removal (26 or more) []  - 0 Hypo / Hyperglycemic Management (close monitor of Blood Glucose) []  - 0 Ankle / Brachial Index (ABI) - do not check if billed separately X- 1 5 Vital Signs Annett, Keiko H. ([id number]) Has the patient been seen at the hospital within the last three years: Yes Total Score: 85 Level Of Care: New/Established - Level 3 Electronic Signature(s) Signed: 04/28/2019 4:25:23 PM By: Montey Hora Entered By: Montey Hora on 04/28/2019 11:48:25 Osentoski, Lanisa Lemmie Evens ([id number]) -------------------------------------------------------------------------------- Encounter Discharge Information Details Patient Name: Carol Berger H. Date of Service: 04/28/2019 11:30 AM Medical Record Number: [id number] Patient Account Number: [id number] Date of Birth/Sex: Sep 23, 1927 (85 y.o. F) Treating RN: Montey Hora Primary Care Rachard Isidro: Park Liter Other Clinician: Referring Jamaurie Bernier: Park Liter Treating Ailed Defibaugh/Extender: [name], [name] Weeks in Treatment: 7 Encounter Discharge Information Items Discharge Condition: Stable Ambulatory Status: Ambulatory Discharge Destination: Home Transportation: Private Auto Accompanied By: self Schedule Follow-up Appointment: Yes Clinical Summary of Care: Electronic Signature(s) Signed: 04/28/2019 4:25:23 PM By: Montey Hora Entered By: Montey Hora on 04/28/2019 11:49:30 Ramella, Chrisie Lemmie Evens ([id number]) -------------------------------------------------------------------------------- Lower Extremity Assessment Details Patient Name: Middlekauff, Rehema H. Date of Service: 04/28/2019 11:30 AM Medical Record Number: [id number] Patient Account Number: [id number] Date of Birth/Sex: 01-24-1928 (85 y.o. F) Treating RN: [name] Barman Primary Care Ayahna Solazzo: Park Liter Other Clinician: Referring Soraya Paquette: Park Liter Treating Rameses Ou/Extender: Sharalyn Ink in Treatment: 7 Electronic Signature(s) Signed: 05/01/2019 9:32:15 AM By: Gretta Cool, BSN, RN, CWS, Kim RN, BSN Entered By: Gretta Cool, BSN, RN, CWS, Kim on 04/28/2019 11:40:18 RACHELMARIE, GAFFKE ([id number]) -------------------------------------------------------------------------------- Multi Wound Chart Details Patient Name: [name] Date of Service: 04/28/2019 11:30 AM Medical Record Number: [id number] Patient Account Number: [id number] Date of Birth/Sex: October 28, 1927 (85 y.o. F) Treating RN: Montey Hora Primary Care Raigen Jagielski: Park Liter Other Clinician: Referring Pa Tennant: Park Liter Treating Deisi Salonga/Extender: [name], [name] Weeks in Treatment: 7 Vital Signs Height(in): 60 Pulse(bpm): 44 Weight(lbs): 21 Blood Pressure(mmHg): 136/45 Body Mass Index(BMI): 18 Temperature(F): 98.37 Respiratory Rate 16 (breaths/min): Photos: [N/A:N/A] Wound Location: Right Elbow N/A N/A Wounding Event: Trauma N/A N/A Primary Etiology: Trauma, Other N/A  N/A Comorbid History: Cataracts, Glaucoma, Asthma, N/A N/A Chronic Obstructive Pulmonary Disease (COPD), Arrhythmia, Hypertension Date Acquired: 04/28/2019 N/A N/A Weeks of Treatment: 0 N/A N/A Wound Status: Open N/A N/A Measurements L x W x D 2x0.1x0.1 N/A N/A (cm) Area (cm) : 0.157 N/A N/A Volume (cm) : 0.016 N/A N/A % Reduction in Area: 0.00% N/A N/A % Reduction in Volume: 0.00% N/A N/A Classification: Partial Thickness N/A N/A Exudate Amount: None Present N/A N/A Wound Margin: Distinct, outline attached N/A N/A Granulation Amount: None Present (0%)  N/A N/A Necrotic Amount: None Present (0%) N/A N/A Exposed Structures: Fascia: No N/A N/A Fat Layer (Subcutaneous Tissue) Exposed: No Tendon: No Muscle: No Joint: No Bone: No Limited to Skin Breakdown Biever, Ayme H. ([id number]) Epithelialization: Large (67-100%) N/A N/A Treatment Notes Electronic Signature(s) Signed: 04/28/2019 4:25:23 PM By: Montey Hora Entered By: Montey Hora on 04/28/2019 11:46:14 Henly, [name] ([id number]) -------------------------------------------------------------------------------- Multi-Disciplinary Care Plan Details Patient Name: [name] Date of Service: 04/28/2019 11:30 AM Medical Record Number: [id number] Patient Account Number: [id number] Date of Birth/Sex: July 07, 1928 (85 y.o. F) Treating RN: Montey Hora Primary Care Zadie Deemer: Park Liter Other Clinician: Referring Joson Sapp: Park Liter Treating Kinta Martis/Extender: [name], [name] Weeks in Treatment: 7 Active Inactive Abuse / Safety / Falls / Self Care Management Nursing Diagnoses: Potential for falls Goals: Patient will not experience any injury related to falls Date Initiated: 03/10/2019 Target Resolution Date: 06/13/2019 Goal Status: Active Interventions: Assess fall risk on admission and as needed Notes: Orientation to the Wound Care Program Nursing Diagnoses: Knowledge deficit related to  the wound healing center program Goals: Patient/caregiver will verbalize understanding of the [facility] Program Date Initiated: 03/10/2019 Target Resolution Date: 06/13/2019 Goal Status: Active Interventions: Provide education on orientation to the wound center Notes: Wound/Skin Impairment Nursing Diagnoses: Impaired tissue integrity Goals: Ulcer/skin breakdown will heal within 14 weeks Date Initiated: 03/10/2019 Target Resolution Date: 06/13/2019 Goal Status: Active Interventions: Assess patient/caregiver ability to obtain necessary supplies Georgi, Juel H. ([id number]) Assess patient/caregiver ability to perform ulcer/skin care regimen upon admission and as needed Assess ulceration(s) every visit Notes: Electronic Signature(s) Signed: 04/28/2019 4:25:23 PM By: Montey Hora Entered By: Montey Hora on 04/28/2019 11:45:58 Yankowski, Kamree H. ([id number]) -------------------------------------------------------------------------------- Pain Assessment Details Patient Name: Carol Berger H. Date of Service: 04/28/2019 11:30 AM Medical Record Number: [id number] Patient Account Number: [id number] Date of Birth/Sex: 18-Mar-1928 (85 y.o. F) Treating RN: [name] Barman Primary Care Tamber Burtch: Park Liter Other Clinician: Referring Hartlyn Reigel: Park Liter Treating Virgen Belland/Extender: [name], [name] Weeks in Treatment: 7 Active Problems Location of Pain Severity and Description of Pain Patient Has Paino Yes Site Locations Pain Location: Generalized Pain Duration of the Pain. Constant / Intermittento Intermittent Rate the pain. Current Pain Level: 3 Worst Pain Level: 10 Character of Pain Describe the Pain: Heavy, Sharp, Shooting Pain Management and Medication Current Pain Management: Electronic Signature(s) Signed: 05/01/2019 9:32:15 AM By: Gretta Cool, BSN, RN, CWS, Kim RN, BSN Entered By: Gretta Cool, BSN, RN, CWS, Kim on 04/28/2019 11:33:24 ARFA, WEISSMAN  ([id number]) -------------------------------------------------------------------------------- Patient/Caregiver Education Details Patient Name: [name] Date of Service: 04/28/2019 11:30 AM Medical Record Number: [id number] Patient Account Number: [id number] Date of Birth/Gender: [birth year] (85 y.o. F) Treating RN: Montey Hora Primary Care Physician: Park Liter Other Clinician: Referring Physician: Park Liter Treating Physician/Extender: Sharalyn Ink in Treatment: 7 Education Assessment Education Provided To: Patient Education Topics Provided Wound/Skin Impairment: Handouts: Other: wound care as ordered Methods: Demonstration, Explain/Verbal Responses: State content correctly Electronic Signature(s) Signed: 04/28/2019 4:25:23 PM By: Montey Hora Entered By: Montey Hora on 04/28/2019 11:48:47 Garringer, Sharnay H. ([id number]) -------------------------------------------------------------------------------- Wound Assessment Details Patient Name: Villers, Lennis H. Date of Service: 04/28/2019 11:30 AM Medical Record Number: [id number] Patient Account Number: [id number] Date of Birth/Sex: 1928-08-18 (85 y.o. F) Treating RN: [name] Barman Primary Care Antionette Luster: Park Liter Other Clinician: Referring Benjamin Merrihew: Park Liter Treating Eren Ryser/Extender: [name], [name] Weeks in Treatment: 7 Wound Status Wound Number: 15 Primary Trauma, Other Etiology: Wound Location: Right Elbow Wound Open Wounding Event: Trauma Status: Date Acquired:  04/28/2019 Comorbid Cataracts, Glaucoma, Asthma, Chronic Weeks Of Treatment: 0 History: Obstructive Pulmonary Disease (COPD), Clustered Wound: No Arrhythmia, Hypertension Photos Wound Measurements Length: (cm) 2 % Reduction Width: (cm) 0.1 % Reduction Depth: (cm) 0.1 Epithelializ Area: (cm) 0.157 Tunneling: Volume: (cm) 0.016 Undermining in Area: 0% in Volume: 0% ation: Large (67-100%) No : No Wound  Description Classification: Partial Thickness Foul Odor Af Wound Margin: Distinct, outline attached Slough/Fibri Exudate Amount: None Present ter Cleansing: No no No Wound Bed Granulation Amount: None Present (0%) Exposed Structure Necrotic Amount: None Present (0%) Fascia Exposed: No Fat Layer (Subcutaneous Tissue) Exposed: No Tendon Exposed: No Muscle Exposed: No Joint Exposed: No Bone Exposed: No Limited to Skin Breakdown Treatment Notes Wound #15 (Right Elbow) Zuluaga, Dayzha H. ([id number]) Notes steri strips, abd and netting Electronic Signature(s) Signed: 05/01/2019 9:32:15 AM By: Gretta Cool, BSN, RN, CWS, Kim RN, BSN Entered By: Gretta Cool, BSN, RN, CWS, Kim on 04/28/2019 11:40:04 Line, [name] ([id number]) -------------------------------------------------------------------------------- Vitals Details Patient Name: [name] Date of Service: 04/28/2019 11:30 AM Medical Record Number: [id number] Patient Account Number: [id number] Date of Birth/Sex: 10/22/1927 (85 y.o. F) Treating RN: [name] Barman Primary Care Montrelle Eddings: Park Liter Other Clinician: Referring Bill Yohn: Park Liter Treating Shaquitta Burbridge/Extender: [name], [name] Weeks in Treatment: 7 Vital Signs Time Taken: 11:34 Temperature (F): 98.37 Height (in): 60 Pulse (bpm): 64 Weight (lbs): 92 Respiratory Rate (breaths/min): 16 Body Mass Index (BMI): 18 Blood Pressure (mmHg): 136/45 Reference Range: 80 - 120 mg / dl Notes Patient asymptomatic, no lightheadedness. Electronic Signature(s) Signed: 05/01/2019 9:32:15 AM By: Gretta Cool, BSN, RN, CWS, Kim RN, BSN Entered By: Gretta Cool, BSN, RN, CWS, Kim on 04/28/2019 11:36:08

## 2019-05-04 ENCOUNTER — Other Ambulatory Visit: Payer: Self-pay

## 2019-05-04 ENCOUNTER — Encounter: Payer: Medicare Other | Admitting: Physician Assistant

## 2019-05-04 DIAGNOSIS — J449 Chronic obstructive pulmonary disease, unspecified: Secondary | ICD-10-CM | POA: Diagnosis not present

## 2019-05-04 DIAGNOSIS — I872 Venous insufficiency (chronic) (peripheral): Secondary | ICD-10-CM | POA: Diagnosis not present

## 2019-05-04 DIAGNOSIS — S81802A Unspecified open wound, left lower leg, initial encounter: Secondary | ICD-10-CM | POA: Diagnosis not present

## 2019-05-04 DIAGNOSIS — S51811A Laceration without foreign body of right forearm, initial encounter: Secondary | ICD-10-CM | POA: Diagnosis not present

## 2019-05-04 DIAGNOSIS — L97812 Non-pressure chronic ulcer of other part of right lower leg with fat layer exposed: Secondary | ICD-10-CM | POA: Diagnosis not present

## 2019-05-04 DIAGNOSIS — S51011A Laceration without foreign body of right elbow, initial encounter: Secondary | ICD-10-CM | POA: Diagnosis not present

## 2019-05-04 DIAGNOSIS — I4891 Unspecified atrial fibrillation: Secondary | ICD-10-CM | POA: Diagnosis not present

## 2019-05-04 DIAGNOSIS — I10 Essential (primary) hypertension: Secondary | ICD-10-CM | POA: Diagnosis not present

## 2019-05-04 DIAGNOSIS — Z87891 Personal history of nicotine dependence: Secondary | ICD-10-CM | POA: Diagnosis not present

## 2019-05-04 DIAGNOSIS — S51012A Laceration without foreign body of left elbow, initial encounter: Secondary | ICD-10-CM | POA: Diagnosis not present

## 2019-05-06 NOTE — Progress Notes (Signed)
EDNAMAE, [name] Visit Report for 05/04/2019 Arrival Information Details Patient Name: [name], [name]. Date of Service: 05/04/2019 2:15 PM Medical Record Number: [id number] Patient Account Number: [id number] Date of Birth/Sex: 1928/06/24 (85 y.o. F) Treating RN: Harold Barban Primary Care Euel Castile: Park Liter Other Clinician: Referring Markeesha Char: Park Liter Treating Naidelin Gugliotta/Extender: [name], [name] Weeks in Treatment: 7 Visit Information History Since Last Visit Added or deleted any medications: No Patient Arrived: Ambulatory Any new allergies or adverse reactions: No Arrival Time: 14:18 Had a fall or experienced change in No Accompanied By: self activities of daily living that may affect Transfer Assistance: None risk of falls: Patient Identification Verified: Yes Signs or symptoms of abuse/neglect since last visito No Secondary Verification Process Yes Hospitalized since last visit: No Completed: Implantable device outside of the clinic excluding No Patient Requires Transmission-Based No cellular tissue based products placed in the center Precautions: since last visit: Patient Has Alerts: Yes Has Dressing in Place as Prescribed: Yes Patient Alerts: Patient on Blood Pain Present Now: No Thinner 81mg  aspirin Electronic Signature(s) Signed: 05/06/2019 11:18:22 AM By: Lorine Bears RCP, RRT, CHT Entered By: Lorine Bears on 05/04/2019 14:20:35 Lewan, Jazman Lemmie Evens ([id number]) -------------------------------------------------------------------------------- Clinic Level of Care Assessment Details Patient Name: [name], [name] Date of Service: 05/04/2019 2:15 PM Medical Record Number: [id number] Patient Account Number: [id number] Date of Birth/Sex: 07-31-1928 (85 y.o. F) Treating RN: Harold Barban Primary Care Jessamy Torosyan: Park Liter Other Clinician: Referring Sharri Loya: Park Liter Treating  Andreus Cure/Extender: [name], [name] Weeks in Treatment: 7 Clinic Level of Care Assessment Items TOOL 4 Quantity Score []  - Use when only an EandM is performed on FOLLOW-UP visit 0 ASSESSMENTS - Nursing Assessment / Reassessment X - Reassessment of Co-morbidities (includes updates in patient status) 1 10 X- 1 5 Reassessment of Adherence to Treatment Plan ASSESSMENTS - Wound and Skin Assessment / Reassessment X - Simple Wound Assessment / Reassessment - one wound 1 5 []  - 0 Complex Wound Assessment / Reassessment - multiple wounds []  - 0 Dermatologic / Skin Assessment (not related to wound area) ASSESSMENTS - Focused Assessment []  - Circumferential Edema Measurements - multi extremities 0 []  - 0 Nutritional Assessment / Counseling / Intervention []  - 0 Lower Extremity Assessment (monofilament, tuning fork, pulses) []  - 0 Peripheral Arterial Disease Assessment (using hand held doppler) ASSESSMENTS - Ostomy and/or Continence Assessment and Care []  - Incontinence Assessment and Management 0 []  - 0 Ostomy Care Assessment and Management (repouching, etc.) PROCESS - Coordination of Care X - Simple Patient / Family Education for ongoing care 1 15 []  - 0 Complex (extensive) Patient / Family Education for ongoing care []  - 0 Staff obtains Programmer, systems, Records, Test Results / Process Orders []  - 0 Staff telephones HHA, Nursing Homes / Clarify orders / etc []  - 0 Routine Transfer to another Facility (non-emergent condition) []  - 0 Routine Hospital Admission (non-emergent condition) []  - 0 New Admissions / Biomedical engineer / Ordering NPWT, Apligraf, etc. []  - 0 Emergency Hospital Admission (emergent condition) X- 1 10 Simple Discharge Coordination Defoor, Journe H. ([id number]) []  - 0 Complex (extensive) Discharge Coordination PROCESS - Special Needs []  - Pediatric / Minor Patient Management 0 []  - 0 Isolation Patient Management []  - 0 Hearing / Language / Visual special  needs []  - 0 Assessment of Community assistance (transportation, D/C planning, etc.) []  - 0 Additional assistance / Altered mentation []  - 0 Support Surface(s) Assessment (bed, cushion, seat, etc.) INTERVENTIONS - Wound Cleansing / Measurement X -  Simple Wound Cleansing - one wound 1 5 []  - 0 Complex Wound Cleansing - multiple wounds X- 1 5 Wound Imaging (photographs - any number of wounds) []  - 0 Wound Tracing (instead of photographs) X- 1 5 Simple Wound Measurement - one wound []  - 0 Complex Wound Measurement - multiple wounds INTERVENTIONS - Wound Dressings X - Small Wound Dressing one or multiple wounds 1 10 []  - 0 Medium Wound Dressing one or multiple wounds []  - 0 Large Wound Dressing one or multiple wounds []  - 0 Application of Medications - topical []  - 0 Application of Medications - injection INTERVENTIONS - Miscellaneous []  - External ear exam 0 []  - 0 Specimen Collection (cultures, biopsies, blood, body fluids, etc.) []  - 0 Specimen(s) / Culture(s) sent or taken to Lab for analysis []  - 0 Patient Transfer (multiple staff / Civil Service fast streamer / Similar devices) []  - 0 Simple Staple / Suture removal (25 or less) []  - 0 Complex Staple / Suture removal (26 or more) []  - 0 Hypo / Hyperglycemic Management (close monitor of Blood Glucose) []  - 0 Ankle / Brachial Index (ABI) - do not check if billed separately X- 1 5 Vital Signs Barnett, Camilia H. ([id number]) Has the patient been seen at the hospital within the last three years: Yes Total Score: 75 Level Of Care: New/Established - Level 2 Electronic Signature(s) Signed: 05/04/2019 4:05:51 PM By: Harold Barban Entered By: Harold Barban on 05/04/2019 14:37:51 Somoza, Shadee Lemmie Evens ([id number]) -------------------------------------------------------------------------------- Encounter Discharge Information Details Patient Name: Carol Berger H. Date of Service: 05/04/2019 2:15 PM Medical Record Number:  [id number] Patient Account Number: [id number] Date of Birth/Sex: 01-08-1928 (85 y.o. F) Treating RN: Harold Barban Primary Care Jadd Gasior: Park Liter Other Clinician: Referring Makaia Rappa: Park Liter Treating Javares Kaufhold/Extender: [name], [name] Weeks in Treatment: 7 Encounter Discharge Information Items Discharge Condition: Stable Ambulatory Status: Ambulatory Discharge Destination: Home Transportation: Private Auto Accompanied By: self Schedule Follow-up Appointment: Yes Clinical Summary of Care: Electronic Signature(s) Signed: 05/04/2019 4:05:51 PM By: Harold Barban Entered By: Harold Barban on 05/04/2019 14:45:51 Benavides, Onisha H. ([id number]) -------------------------------------------------------------------------------- Lower Extremity Assessment Details Patient Name: Novosel, Berenis H. Date of Service: 05/04/2019 2:15 PM Medical Record Number: [id number] Patient Account Number: [id number] Date of Birth/Sex: 1928-05-26 (85 y.o. F) Treating RN: Montey Hora Primary Care Darrius Montano: Park Liter Other Clinician: Referring Lachlan Pelto: Park Liter Treating Eamonn Sermeno/Extender: [name], [name] Weeks in Treatment: 7 Electronic Signature(s) Signed: 05/04/2019 3:47:55 PM By: Montey Hora Entered By: Montey Hora on 05/04/2019 14:27:05 Chatterjee, Rashika Lemmie Evens ([id number]) -------------------------------------------------------------------------------- Multi Wound Chart Details Patient Name: Malak, Tyauna H. Date of Service: 05/04/2019 2:15 PM Medical Record Number: [id number] Patient Account Number: [id number] Date of Birth/Sex: 01/04/1928 (85 y.o. F) Treating RN: Harold Barban Primary Care Trevis Eden: Park Liter Other Clinician: Referring Megin Consalvo: Park Liter Treating Arabella Revelle/Extender: [name], [name] Weeks in Treatment: 7 Vital Signs Height(in): 60 Pulse(bpm): 34 Weight(lbs): 63 Blood Pressure(mmHg): 164/54 Body Mass Index(BMI):  18 Temperature(F): 98.4 Respiratory Rate 16 (breaths/min): Photos: [N/A:N/A] Wound Location: Right Elbow N/A N/A Wounding Event: Trauma N/A N/A Primary Etiology: Trauma, Other N/A N/A Comorbid History: Cataracts, Glaucoma, Asthma, N/A N/A Chronic Obstructive Pulmonary Disease (COPD), Arrhythmia, Hypertension Date Acquired: 04/28/2019 N/A N/A Weeks of Treatment: 0 N/A N/A Wound Status: Open N/A N/A Measurements L x W x D 0.1x0.1x0.1 N/A N/A (cm) Area (cm) : 0.008 N/A N/A Volume (cm) : 0.001 N/A N/A % Reduction in Area: 94.90% N/A N/A % Reduction in Volume: 93.80% N/A N/A Classification: Partial Thickness N/A N/A Exudate Amount:  None Present N/A N/A Wound Margin: Distinct, outline attached N/A N/A Granulation Amount: None Present (0%) N/A N/A Necrotic Amount: None Present (0%) N/A N/A Exposed Structures: Fascia: No N/A N/A Fat Layer (Subcutaneous Tissue) Exposed: No Tendon: No Muscle: No Joint: No Bone: No Limited to Skin Breakdown Longenecker, Sevana H. ([id number]) Epithelialization: Large (67-100%) N/A N/A Treatment Notes Electronic Signature(s) Signed: 05/04/2019 4:05:51 PM By: Harold Barban Entered By: Harold Barban on 05/04/2019 14:36:13 Axton, Melvia Lemmie Evens ([id number]) -------------------------------------------------------------------------------- Multi-Disciplinary Care Plan Details Patient Name: [name] Date of Service: 05/04/2019 2:15 PM Medical Record Number: [id number] Patient Account Number: [id number] Date of Birth/Sex: 10/12/1927 (85 y.o. F) Treating RN: Harold Barban Primary Care Shamere Dilworth: Park Liter Other Clinician: Referring Karan Inclan: Park Liter Treating Sahar Ryback/Extender: [name], [name] Weeks in Treatment: 7 Active Inactive Abuse / Safety / Falls / Self Care Management Nursing Diagnoses: Potential for falls Goals: Patient will not experience any injury related to falls Date Initiated: 03/10/2019 Target Resolution  Date: 06/13/2019 Goal Status: Active Interventions: Assess fall risk on admission and as needed Notes: Orientation to the Wound Care Program Nursing Diagnoses: Knowledge deficit related to the wound healing center program Goals: Patient/caregiver will verbalize understanding of the [facility] Program Date Initiated: 03/10/2019 Target Resolution Date: 06/13/2019 Goal Status: Active Interventions: Provide education on orientation to the wound center Notes: Wound/Skin Impairment Nursing Diagnoses: Impaired tissue integrity Goals: Ulcer/skin breakdown will heal within 14 weeks Date Initiated: 03/10/2019 Target Resolution Date: 06/13/2019 Goal Status: Active Interventions: Assess patient/caregiver ability to obtain necessary supplies Pelster, Dayja H. ([id number]) Assess patient/caregiver ability to perform ulcer/skin care regimen upon admission and as needed Assess ulceration(s) every visit Notes: Electronic Signature(s) Signed: 05/04/2019 4:05:51 PM By: Harold Barban Entered By: Harold Barban on 05/04/2019 14:35:43 Agro, Daliyah H. ([id number]) -------------------------------------------------------------------------------- Pain Assessment Details Patient Name: [name] Date of Service: 05/04/2019 2:15 PM Medical Record Number: [id number] Patient Account Number: [id number] Date of Birth/Sex: [birth year] (85 y.o. F) Treating RN: Harold Barban Primary Care Kenzi Bardwell: Park Liter Other Clinician: Referring Tiwanda Threats: Park Liter Treating Evarose Altland/Extender: [name], [name] Weeks in Treatment: 7 Active Problems Location of Pain Severity and Description of Pain Patient Has Paino No Site Locations Pain Management and Medication Current Pain Management: Electronic Signature(s) Signed: 05/04/2019 4:05:51 PM By: Harold Barban Signed: 05/06/2019 11:18:22 AM By: Lorine Bears RCP, RRT, CHT Entered By: Lorine Bears on  05/04/2019 14:20:53 Kegley, Tonnie Lemmie Evens ([id number]) -------------------------------------------------------------------------------- Patient/Caregiver Education Details Patient Name: [name] Date of Service: 05/04/2019 2:15 PM Medical Record Number: [id number] Patient Account Number: [id number] Date of Birth/Gender: 1927/09/13 (85 y.o. F) Treating RN: Harold Barban Primary Care Physician: Park Liter Other Clinician: Referring Physician: Park Liter Treating Physician/Extender: Sharalyn Ink in Treatment: 7 Education Assessment Education Provided To: Patient Education Topics Provided Wound/Skin Impairment: Handouts: Caring for Your Ulcer Methods: Demonstration, Explain/Verbal Responses: State content correctly Electronic Signature(s) Signed: 05/04/2019 4:05:51 PM By: Harold Barban Entered By: Harold Barban on 05/04/2019 14:36:29 Mauney, Saranne H. ([id number]) -------------------------------------------------------------------------------- Wound Assessment Details Patient Name: Gallacher, Chauntelle H. Date of Service: 05/04/2019 2:15 PM Medical Record Number: [id number] Patient Account Number: [id number] Date of Birth/Sex: 11/30/1927 (85 y.o. F) Treating RN: Montey Hora Primary Care Julio Storr: Park Liter Other Clinician: Referring Onesty Clair: Park Liter Treating Archer Vise/Extender: [name], [name] Weeks in Treatment: 7 Wound Status Wound Number: 15 Primary Trauma, Other Etiology: Wound Location: Right Elbow Wound Open Wounding Event: Trauma Status: Date Acquired: 04/28/2019 Comorbid Cataracts, Glaucoma, Asthma, Chronic Weeks Of Treatment: 0 History: Obstructive Pulmonary  Disease (COPD), Clustered Wound: No Arrhythmia, Hypertension Photos Wound Measurements Length: (cm) 0.1 % Reduction Width: (cm) 0.1 % Reduction Depth: (cm) 0.1 Epithelializ Area: (cm) 0.008 Tunneling: Volume: (cm) 0.001 Undermining in Area: 94.9% in Volume:  93.8% ation: Large (67-100%) No : No Wound Description Classification: Partial Thickness Foul Odor Af Wound Margin: Distinct, outline attached Slough/Fibri Exudate Amount: None Present ter Cleansing: No no No Wound Bed Granulation Amount: None Present (0%) Exposed Structure Necrotic Amount: None Present (0%) Fascia Exposed: No Fat Layer (Subcutaneous Tissue) Exposed: No Tendon Exposed: No Muscle Exposed: No Joint Exposed: No Bone Exposed: No Limited to Skin Breakdown Treatment Notes Wound #15 (Right Elbow) Adcock, Kaelan H. ([id number]) Notes Xeroform, dry gauze, Stretchy Electronic Signature(s) Signed: 05/04/2019 3:47:55 PM By: Montey Hora Entered By: Montey Hora on 05/04/2019 14:26:33 Baskins, Malynn Lemmie Evens ([id number]) -------------------------------------------------------------------------------- [place] Details Patient Name: Carol Berger H. Date of Service: 05/04/2019 2:15 PM Medical Record Number: [id number] Patient Account Number: [id number] Date of Birth/Sex: Feb 28, 1928 (85 y.o. F) Treating RN: Harold Barban Primary Care Jelicia Nantz: Park Liter Other Clinician: Referring Iyauna Sing: Park Liter Treating Reford Olliff/Extender: [name], [name] Weeks in Treatment: 7 Vital Signs Time Taken: 14:20 Temperature (F): 98.4 Height (in): 60 Pulse (bpm): 58 Weight (lbs): 92 Respiratory Rate (breaths/min): 16 Body Mass Index (BMI): 18 Blood Pressure (mmHg): 164/54 Reference Range: 80 - 120 mg / dl Electronic Signature(s) Signed: 05/06/2019 11:18:22 AM By: Lorine Bears RCP, RRT, CHT Entered By: Lorine Bears on 05/04/2019 14:21:41

## 2019-05-07 NOTE — Progress Notes (Signed)
CHER, KITTO ([id number]) Visit Report for 05/04/2019 Chief Complaint Document Details Patient Name: [name], [name]. Date of Service: 05/04/2019 2:15 PM Medical Record Number: [id number] Patient Account Number: [id number] Date of Birth/Sex: [birth year] (85 y.o. F) Treating RN: Harold Barban Primary Care Provider: Park Liter Other Clinician: Referring Provider: Park Liter Treating Provider/Extender: [name], [name] Weeks in Treatment: 7 Information Obtained from: Patient Chief Complaint Right elbow skin tear Electronic Signature(s) Signed: 05/04/2019 2:50:58 PM By: Worthy Keeler PA-C Entered By: Worthy Keeler on 05/04/2019 14:50:57 Cassara, [place] H. ([id number]) -------------------------------------------------------------------------------- HPI Details Patient Name: [name]. Date of Service: 05/04/2019 2:15 PM Medical Record Number: [id number] Patient Account Number: [id number] Date of Birth/Sex: 04/27/1928 (85 y.o. F) Treating RN: Harold Barban Primary Care Provider: Park Liter Other Clinician: Referring Provider: Park Liter Treating Provider/Extender: [name], [name] Weeks in Treatment: 7 History of Present Illness HPI Description: 02/12/18 ADMISSION This is an 85 year old woman who is recently moved to [place] from [place]. Her story began in February where she fell on some ice suffering extensive lacerations to her bilateral lower extremities. She is able to show me extensive pictures of the right lower leg but with going to a wound care center and [place] 3 times a week these eventually closed over. She has been left with 1 area on the upper lateral left calf. She has been applying Neosporin to this and applying a bandage. Currently this measures 2 x 1.5 cm. The patient is not a diabetic. She is an ex-smoker quitting 40 years ago. She does have COPD by description. ABIs in our clinic were 1.32 on the right and 1.03 on the  left 02/19/18; right lower leg wound which was initially trauma. The area that we look that last week is smaller. Still covered in a nonviable surface however. We are using Iodoflex 02/26/18; right lower leg wound which was initially trauma in the setting of chronic venous insufficiency. Surface of the wound looks much better healthy granulation advancing epithelialization. We have good edema control 03/05/18; right lower leg wound which was initially trauma in the setting of chronic venous insufficiency. She continues to make nice progress here. We have good edema control oHe arrives today with a new traumatic laceration on her right dorsal forearm which she states happened while she was putting on a sweatshirt 03/12/18; right lower leg wound as closed however it still looks vulnerable. She has chronic venous insufficiency. oThe new wound from last week a traumatic area on her right dorsal or arm unfortunately does not have a viable surface. I remove some nonviable skin and necrotic subcutaneous debris from the wound surface. Hemostasis with silver nitrate and direct pressure 03/19/18; right lower leg wound is closed. She has chronic venous insufficiency and will need ongoing compression oThe new wounds from 2 weeks ago on the right upper elbow is closed. The area on her right dorsal forearm continues to have a nonviable surface requiring debridement 03/26/18; we'll need to look into ongoing compression for her legs. 2 wounds on the right upper elbow is closed. The area on the right dorsal forearm looks better. Hydrofera Blue secondary to hypertrophic granulation 04/02/18; he has compression stockings although she did not wear them today. Severe chronic venous insufficiency. She has 2 wounds on the right upper elbow which I said were closed last week which actually are although they're very small and she has a smaller clean wound on the right dorsal forearm. We've been using Hydrofera Blue secondary to  some  upper granulation. All of this looks better 04/09/18-She is seen in follow up evaluation for a right elbow and right forearm skin tear. The right elbow is healed. We will continue with same treatment plan and she'll follow next week 04/16/18-She is seen in follow-up evaluation for right forearm skin tear, this is essentially healed. We will cover with foam border and she will follow-up next week 04/23/18; the patient arrives with a new skin tear on her right dorsal arm just below the elbow. She got this while carrying a box. She has a linear skin tear. There is no depth I don't think this should've been sutured. There is a skin flap medially I'm not sure if this will be viable or not 04/30/18; the new skin tear from last week unfortunately has remained viable in terms of the skin flap. She has a small open area that looks healthy. Using moistened silver collagen 05/07/18; right dorsal arm skin tear. Nonviable tissue over the remnants of the wound. We've been using silver collagen 05/21/18; right dorsal arm skin tear. Nonviable tissue over a small remnant of the wound was washed off. We've been using silver collagen which we will continue 06/04/18; the right dorsal arm skin tear has healed. She arrives today with a traumatic wound just above the olecranon of her left elbow. This is a skin tear with a nonviable nonadherent flap which was removed. We'll use silver collagen here. Also noteworthy she arrives without her compression stockings 06/18/18; the area just above the olecranon of her left elbow. It is smaller and generally has a healthy surface. I was surprised [name], [name]. ([id number]) to learn that the patient is actually changing this herself although. Appears this week she was putting some topical lidocaine on this that she bought over-the-counter at the drugstore. She did get supplies at home she didn't know how to put it on. She drove herself here today i.e. not eligible for home  health Readmission: 11/04/18 on evaluation today patient presents for evaluation our clinic news to include the she has on her lower extremity which occurred as the result of having hit this on a piece of furniture. This was back in December. She states that her daughter has been trying to get her to come into have this evaluated here the wound care center but she was being somewhat stubborn. Nonetheless upon evaluation today the wound really appears to be potentially healed she does have some swelling of lower extremity which I think will come into play. With that being said he tells me that even yesterday this was still making clear fluid and therefore there may be some issues with continued problems with the swelling and drainage secondary to her venous stasis. Nonetheless overall wound appears to be doing fairly well which is good news. 11/11/18 on evaluation today patient actually appears to be doing excellent in the area of question that was green just the day before he saw her last time actually appears to have not drain that always in past week. I think this is done very well and at this point though she's had some discomfort I do not see any evidence of open wound which he needs to continue see wound care. Readmission: 03/10/19 on evaluation today patient presents for reevaluation here in our clinic concerning issues that she actually is having with the new problem on her right lower leg due to trauma that occurred when a car door shut on her leg last Thursday. This is not been quite a  week and she does have several skin tears unfortunately that resulted from this injury. Fortunately there's no signs of active infection at this time. No fevers, chills, nausea, or vomiting noted at this time. She does have some hot tissue noted at this point that has me concerned about the possibility of needing to move this in order to allow the areas to heal appropriately. Fortunately there's no signs of  active infection at this time. 03/17/19-Patient returns to clinic at 1 week, we have been using Prisma on the larger wounds with Kerlix and Xeroform. As many as 5 wounds were number last time 3 of which have closed, leaving the right proximal tibial and right distal medial leg wound both of which are looking good 03/24/19 on evaluation today patient appears to be doing better in regard to her wounds at this time. Fortunately there is no sign of active infection at this time. No fevers chills noted. She's been tolerating the dressing changes without complication which is good news she's been performing these herself it sounds like home health is not going to be coming out it sounds as if they feel like she may be able to take care of yourself and not be technically homebound. Nonetheless though that is unfortunate it does seem like she's done fairly well with taking care of yourself from the standpoint of the dressings. 03/31/19 on evaluation today patient actually appears to be doing well with regard to her lower extremity ulcers. She had one area to heal today and in regard to her right lower extremity were she continues to have an open wound I did have to perform some sharp debridement today. Subsequently the patient still has an area of what appears to be possibly a small hematoma on the anterior portion of her left lower extremity. This is not open enough at this time but nonetheless does seem to show some evidence of hardening which is not necessarily a bad thing. Eventually this may pop off but again I'm not interested in really removing this prematurely. 04/07/19 on evaluation today patient actually appears to be doing well with regard to her lower extremity ulcers. She is been tolerating the dressing changes without complication. She seems to be making excellent progress which is great news. She does have one small new skin tear area but the good news is she was able to pull the skin back over  and it really looks like it is healing quite nicely. Overall that is just a very small region and again seems to be healing nicely. 04/14/19 upon evaluation today patient actually appears to be doing quite well with regard to the wounds on the right lower extremity. In fact everything appears to be healed as of today. This is excellent news. She does however have the area on the left lower extremity which is showing some signs of loosening up we have been watching this area it is no longer fluctuant and in fact with the Betadine on it it is been slowly dry now but is now lifting up on the edge. There may be a wound underneath this region. 04/21/2019 on evaluation today patient appears to be doing excellent in regard to her bilateral lower extremities. In fact she has been tolerating the dressing changes without complication does not seem to show any signs of infection at this point. In fact based on what I am seeing currently I think she is completely healed. [name], [name] 05/04/2019 on evaluation today patient actually appears to be  doing quite well with regard to her right elbow. She has been tolerating the dressing changes without complication. Fortunately there is no signs of active infection at this time. Overall I am very pleased with how things are progressing. The elbow almost appears to be completely healed though it is not 100% today. Electronic Signature(s) Signed: 05/04/2019 5:34:09 PM By: Worthy Keeler PA-C Entered By: Worthy Keeler on 05/04/2019 17:34:09 [name], [name] -------------------------------------------------------------------------------- Physical Exam Details Patient Name: [name], [name] Date of Service: 05/04/2019 2:15 PM Medical Record Number: [id number] Patient Account Number: [id number] Date of Birth/Sex: [birth year] (85 y.o. F) Treating RN: Harold Barban Primary Care Provider: Park Liter Other Clinician: Referring  Provider: Park Liter Treating Provider/Extender: [name], [name] Weeks in Treatment: 7 Constitutional Well-nourished and well-hydrated in no acute distress. Respiratory normal breathing without difficulty. clear to auscultation bilaterally. Cardiovascular regular rate and rhythm with normal S1, S2. Psychiatric this patient is able to make decisions and demonstrates good insight into disease process. Alert and Oriented x 3. pleasant and cooperative. Notes Patient's wound currently shows just a very small opening still remaining at this time and overall I am very pleased with how things seem to be progressing. The skin which was pulled back during the skin tear appears to have for the most part completely reattached and again there was minimal healing that had to take place and seems to be progressing well. Electronic Signature(s) Signed: 05/04/2019 5:34:46 PM By: Worthy Keeler PA-C Entered By: Worthy Keeler on 05/04/2019 17:34:46 Bue[place], [name] -------------------------------------------------------------------------------- Physician Orders Details Patient Name: [name]. Date of Service: 05/04/2019 2:15 PM Medical Record Number: [id number] Patient Account Number: [id number] Date of Birth/Sex: 27-Sep-1927 (85 y.o. F) Treating RN: Harold Barban Primary Care Provider: Park Liter Other Clinician: Referring Provider: Park Liter Treating Provider/Extender: [name], [name] Weeks in Treatment: 7 Verbal / Phone Orders: No Diagnosis Coding Wound Cleansing Wound #15 Right Elbow o May shower with protection. Primary Wound Dressing Wound #15 Right Elbow o Xeroform Secondary Dressing Wound #15 Right Elbow o Dry Gauze - Stretch net to secure Dressing Change Frequency Wound #15 Right Elbow o Three times weekly Follow-up Appointments Wound #15 Right Elbow o Return Appointment in 1 week. - Next Thursday 10th or Friday 11th Electronic  Signature(s) Signed: 05/04/2019 4:05:51 PM By: Harold Barban Signed: 05/04/2019 6:34:02 PM By: Worthy Keeler PA-C Entered By: Harold Barban on 05/04/2019 14:39:52 Jennette, Salvatrice Lemmie Evens ([id number]) -------------------------------------------------------------------------------- Problem List Details Patient Name: [name], [name] Date of Service: 05/04/2019 2:15 PM Medical Record Number: [id number] Patient Account Number: [id number] Date of Birth/Sex: 07/07/1928 (85 y.o. F) Treating RN: Harold Barban Primary Care Provider: Park Liter Other Clinician: Referring Provider: Park Liter Treating Provider/Extender: [name], [name] Weeks in Treatment: 7 Active Problems ICD-10 Evaluated Encounter Code Description Active Date Today Diagnosis I87.2 Venous insufficiency (chronic) (peripheral) 03/10/2019 No Yes S51.001A Unspecified open wound of right elbow, initial encounter 04/28/2019 No Yes Inactive Problems Resolved Problems ICD-10 Code Description Active Date Resolved Date L97.812 Non-pressure chronic ulcer of other part of right lower leg with fat 03/10/2019 03/10/2019 layer exposed Electronic Signature(s) Signed: 05/04/2019 2:50:46 PM By: Worthy Keeler PA-C Entered By: Worthy Keeler on 05/04/2019 14:50:45 [name], [name] ([id number]) -------------------------------------------------------------------------------- Progress Note Details Patient Name: [name], [name] Date of Service: 05/04/2019 2:15 PM Medical Record Number: [id number] Patient Account Number: [id number] Date of Birth/Sex: [birth year] (85 y.o. F) Treating RN: Harold Barban Primary Care Provider: Park Liter Other Clinician: Referring  Provider: Park Liter Treating Provider/Extender: [name], [name] Weeks in Treatment: 7 Subjective Chief Complaint Information obtained from Patient Right elbow skin tear History of Present Illness (HPI) 02/12/18 ADMISSION This is an 85 year old woman who is  recently moved to [place] from [place]. Her story began in February where she fell on some ice suffering extensive lacerations to her bilateral lower extremities. She is able to show me extensive pictures of the right lower leg but with going to a wound care center and [place] 3 times a week these eventually closed over. She has been left with 1 area on the upper lateral left calf. She has been applying Neosporin to this and applying a bandage. Currently this measures 2 x 1.5 cm. The patient is not a diabetic. She is an ex-smoker quitting 40 years ago. She does have COPD by description. ABIs in our clinic were 1.32 on the right and 1.03 on the left 02/19/18; right lower leg wound which was initially trauma. The area that we look that last week is smaller. Still covered in a nonviable surface however. We are using Iodoflex 02/26/18; right lower leg wound which was initially trauma in the setting of chronic venous insufficiency. Surface of the wound looks much better healthy granulation advancing epithelialization. We have good edema control 03/05/18; right lower leg wound which was initially trauma in the setting of chronic venous insufficiency. She continues to make nice progress here. We have good edema control He arrives today with a new traumatic laceration on her right dorsal forearm which she states happened while she was putting on a sweatshirt 03/12/18; right lower leg wound as closed however it still looks vulnerable. She has chronic venous insufficiency. The new wound from last week a traumatic area on her right dorsal or arm unfortunately does not have a viable surface. I remove some nonviable skin and necrotic subcutaneous debris from the wound surface. Hemostasis with silver nitrate and direct pressure 03/19/18; right lower leg wound is closed. She has chronic venous insufficiency and will need ongoing compression The new wounds from 2 weeks ago on the right upper elbow is closed. The  area on her right dorsal forearm continues to have a nonviable surface requiring debridement 03/26/18; we'll need to look into ongoing compression for her legs. 2 wounds on the right upper elbow is closed. The area on the right dorsal forearm looks better. Hydrofera Blue secondary to hypertrophic granulation 04/02/18; he has compression stockings although she did not wear them today. Severe chronic venous insufficiency. She has 2 wounds on the right upper elbow which I said were closed last week which actually are although they're very small and she has a smaller clean wound on the right dorsal forearm. We've been using Hydrofera Blue secondary to some upper granulation. All of this looks better 04/09/18-She is seen in follow up evaluation for a right elbow and right forearm skin tear. The right elbow is healed. We will continue with same treatment plan and she'll follow next week 04/16/18-She is seen in follow-up evaluation for right forearm skin tear, this is essentially healed. We will cover with foam border and she will follow-up next week 04/23/18; the patient arrives with a new skin tear on her right dorsal arm just below the elbow. She got this while carrying a box. She has a linear skin tear. There is no depth I don't think this should've been sutured. There is a skin flap medially I'm not sure if this will be viable or not 04/30/18;  the new skin tear from last week unfortunately has remained viable in terms of the skin flap. She has a small open area that looks healthy. Using moistened silver collagen 05/07/18; right dorsal arm skin tear. Nonviable tissue over the remnants of the wound. We've been using silver collagen [name], [name] 05/21/18; right dorsal arm skin tear. Nonviable tissue over a small remnant of the wound was washed off. We've been using silver collagen which we will continue 06/04/18; the right dorsal arm skin tear has healed. She arrives today with a traumatic  wound just above the olecranon of her left elbow. This is a skin tear with a nonviable nonadherent flap which was removed. We'll use silver collagen here. Also noteworthy she arrives without her compression stockings 06/18/18; the area just above the olecranon of her left elbow. It is smaller and generally has a healthy surface. I was surprised to learn that the patient is actually changing this herself although. Appears this week she was putting some topical lidocaine on this that she bought over-the-counter at the drugstore. She did get supplies at home she didn't know how to put it on. She drove herself here today i.e. not eligible for home health Readmission: 11/04/18 on evaluation today patient presents for evaluation our clinic news to include the she has on her lower extremity which occurred as the result of having hit this on a piece of furniture. This was back in December. She states that her daughter has been trying to get her to come into have this evaluated here the wound care center but she was being somewhat stubborn. Nonetheless upon evaluation today the wound really appears to be potentially healed she does have some swelling of lower extremity which I think will come into play. With that being said he tells me that even yesterday this was still making clear fluid and therefore there may be some issues with continued problems with the swelling and drainage secondary to her venous stasis. Nonetheless overall wound appears to be doing fairly well which is good news. 11/11/18 on evaluation today patient actually appears to be doing excellent in the area of question that was green just the day before he saw her last time actually appears to have not drain that always in past week. I think this is done very well and at this point though she's had some discomfort I do not see any evidence of open wound which he needs to continue see wound care. Readmission: 03/10/19 on evaluation today  patient presents for reevaluation here in our clinic concerning issues that she actually is having with the new problem on her right lower leg due to trauma that occurred when a car door shut on her leg last Thursday. This is not been quite a week and she does have several skin tears unfortunately that resulted from this injury. Fortunately there's no signs of active infection at this time. No fevers, chills, nausea, or vomiting noted at this time. She does have some hot tissue noted at this point that has me concerned about the possibility of needing to move this in order to allow the areas to heal appropriately. Fortunately there's no signs of active infection at this time. 03/17/19-Patient returns to clinic at 1 week, we have been using Prisma on the larger wounds with Kerlix and Xeroform. As many as 5 wounds were number last time 3 of which have closed, leaving the right proximal tibial and right distal medial leg wound both of which are looking  good 03/24/19 on evaluation today patient appears to be doing better in regard to her wounds at this time. Fortunately there is no sign of active infection at this time. No fevers chills noted. She's been tolerating the dressing changes without complication which is good news she's been performing these herself it sounds like home health is not going to be coming out it sounds as if they feel like she may be able to take care of yourself and not be technically homebound. Nonetheless though that is unfortunate it does seem like she's done fairly well with taking care of yourself from the standpoint of the dressings. 03/31/19 on evaluation today patient actually appears to be doing well with regard to her lower extremity ulcers. She had one area to heal today and in regard to her right lower extremity were she continues to have an open wound I did have to perform some sharp debridement today. Subsequently the patient still has an area of what appears to be  possibly a small hematoma on the anterior portion of her left lower extremity. This is not open enough at this time but nonetheless does seem to show some evidence of hardening which is not necessarily a bad thing. Eventually this may pop off but again I'm not interested in really removing this prematurely. 04/07/19 on evaluation today patient actually appears to be doing well with regard to her lower extremity ulcers. She is been tolerating the dressing changes without complication. She seems to be making excellent progress which is great news. She does have one small new skin tear area but the good news is she was able to pull the skin back over and it really looks like it is healing quite nicely. Overall that is just a very small region and again seems to be healing nicely. 04/14/19 upon evaluation today patient actually appears to be doing quite well with regard to the wounds on the right lower extremity. In fact everything appears to be healed as of today. This is excellent news. She does however have the area on the left lower extremity which is showing some signs of loosening up we have been watching this area it is no longer fluctuant and in fact with the Betadine on it it is been slowly dry now but is now lifting up on the edge. There may be a wound underneath [name], [name] this region. 04/21/2019 on evaluation today patient appears to be doing excellent in regard to her bilateral lower extremities. In fact she has been tolerating the dressing changes without complication does not seem to show any signs of infection at this point. In fact based on what I am seeing currently I think she is completely healed. 05/04/2019 on evaluation today patient actually appears to be doing quite well with regard to her right elbow. She has been tolerating the dressing changes without complication. Fortunately there is no signs of active infection at this time. Overall I am very pleased with  how things are progressing. The elbow almost appears to be completely healed though it is not 100% today. Patient History Information obtained from Patient. Family History Cancer - Siblings,Paternal Grandparents, Diabetes - Siblings, Heart Disease - Mother,Father,Child,Paternal Grandparents, Hypertension - Child, No family history of Hereditary Spherocytosis, Kidney Disease, Lung Disease, Seizures, Stroke, Thyroid Problems, Tuberculosis. Social History Former smoker - quit 40 yrs ago, Marital Status - Divorced, Alcohol Use - Never, Drug Use - No History, Caffeine Use - Rarely. Medical History Eyes Patient has history of Cataracts -  surgery 2000, Glaucoma Respiratory Patient has history of Asthma, Chronic Obstructive Pulmonary Disease (COPD) Denies history of Pneumothorax, Sleep Apnea, Tuberculosis Cardiovascular Patient has history of Arrhythmia - a-fib, Hypertension Denies history of Angina, Congestive Heart Failure, Coronary Artery Disease, Deep Vein Thrombosis, Hypotension, Myocardial Infarction, Peripheral Arterial Disease, Peripheral Venous Disease, Phlebitis, Vasculitis Endocrine Denies history of Type I Diabetes, Type II Diabetes Genitourinary Denies history of End Stage Renal Disease Immunological Denies history of Lupus Erythematosus, Raynaud s, Scleroderma Integumentary (Skin) Denies history of History of Burn, History of pressure wounds Musculoskeletal Denies history of Gout, Rheumatoid Arthritis, Osteoarthritis, Osteomyelitis Neurologic Denies history of Dementia, Neuropathy, Quadriplegia, Paraplegia, Seizure Disorder Oncologic Denies history of Received Chemotherapy, Received Radiation Psychiatric Denies history of Anorexia/bulimia, Confinement Anxiety Hospitalization/Surgery History - Colon cancer. Review of Systems (ROS) Constitutional Symptoms (General Health) Denies complaints or symptoms of Fatigue, Fever, Chills, Marked Weight  Change. Cardiovascular Denies complaints or symptoms of Chest pain, LE edema. Psychiatric Hofferber, Azhia H. ([id number]) Denies complaints or symptoms of Anxiety, Claustrophobia. Objective Constitutional Well-nourished and well-hydrated in no acute distress. Vitals Time Taken: 2:20 PM, Height: 60 in, Weight: 92 lbs, BMI: 18, Temperature: 98.4 F, Pulse: 58 bpm, Respiratory Rate: 16 breaths/min, Blood Pressure: 164/54 mmHg. Respiratory normal breathing without difficulty. clear to auscultation bilaterally. Cardiovascular regular rate and rhythm with normal S1, S2. Psychiatric this patient is able to make decisions and demonstrates good insight into disease process. Alert and Oriented x 3. pleasant and cooperative. General Notes: Patient's wound currently shows just a very small opening still remaining at this time and overall I am very pleased with how things seem to be progressing. The skin which was pulled back during the skin tear appears to have for the most part completely reattached and again there was minimal healing that had to take place and seems to be progressing well. Integumentary (Hair, Skin) Wound #15 status is Open. Original cause of wound was Trauma. The wound is located on the Right Elbow. The wound measures 0.1cm length x 0.1cm width x 0.1cm depth; 0.008cm^2 area and 0.001cm^3 volume. The wound is limited to skin breakdown. There is no tunneling or undermining noted. There is a none present amount of drainage noted. The wound margin is distinct with the outline attached to the wound base. There is no granulation within the wound bed. There is no necrotic tissue within the wound bed. Assessment Active Problems ICD-10 Venous insufficiency (chronic) (peripheral) Unspecified open wound of right elbow, initial encounter [name], [name] Plan Wound Cleansing: Wound #15 Right Elbow: May shower with protection. Primary Wound Dressing: Wound #15  Right Elbow: Xeroform Secondary Dressing: Wound #15 Right Elbow: Dry Gauze - Stretch net to secure Dressing Change Frequency: Wound #15 Right Elbow: Three times weekly Follow-up Appointments: Wound #15 Right Elbow: Return Appointment in 1 week. - Next Thursday 10th or Friday 11th 1. I am in a suggest currently that we go ahead and initiate treatment with a Xeroform gauze dressing I think that is really all that the patient will need at this time and likely she will be healed in the next 1-1-1/2 weeks. 2. I am in a suggest that she continue to be careful with the elbow as far as keeping the dressing on it to keep it padded and protected I think this is very important. We will see patient back for reevaluation in 1 week here in the clinic. If anything worsens or changes patient will contact our office for additional recommendations. Electronic Signature(s) Signed: 05/04/2019 5:35:17 PM  By: Worthy Keeler PA-C Entered By: Worthy Keeler on 05/04/2019 17:35:17 [name], [name] -------------------------------------------------------------------------------- ROS/PFSH Details Patient Name: [name]. Date of Service: 05/04/2019 2:15 PM Medical Record Number: [id number] Patient Account Number: [id number] Date of Birth/Sex: 17-Oct-1927 (85 y.o. F) Treating RN: Harold Barban Primary Care Provider: Park Liter Other Clinician: Referring Provider: Park Liter Treating Provider/Extender: [name], [name] Weeks in Treatment: 7 Information Obtained From Patient Constitutional Symptoms (General Health) Complaints and Symptoms: Negative for: Fatigue; Fever; Chills; Marked Weight Change Cardiovascular Complaints and Symptoms: Negative for: Chest pain; LE edema Medical History: Positive for: Arrhythmia - a-fib; Hypertension Negative for: Angina; Congestive Heart Failure; Coronary Artery Disease; Deep Vein Thrombosis; Hypotension; Myocardial Infarction;  Peripheral Arterial Disease; Peripheral Venous Disease; Phlebitis; Vasculitis Psychiatric Complaints and Symptoms: Negative for: Anxiety; Claustrophobia Medical History: Negative for: Anorexia/bulimia; Confinement Anxiety Eyes Medical History: Positive for: Cataracts - surgery 2000; Glaucoma Respiratory Medical History: Positive for: Asthma; Chronic Obstructive Pulmonary Disease (COPD) Negative for: Pneumothorax; Sleep Apnea; Tuberculosis Endocrine Medical History: Negative for: Type I Diabetes; Type II Diabetes Genitourinary Medical History: Negative for: End Stage Renal Disease Immunological [name], NAJAFI. ([id number]) Medical History: Negative for: Lupus Erythematosus; Raynaudos; Scleroderma Integumentary (Skin) Medical History: Negative for: History of Burn; History of pressure wounds Musculoskeletal Medical History: Negative for: Gout; Rheumatoid Arthritis; Osteoarthritis; Osteomyelitis Neurologic Medical History: Negative for: Dementia; Neuropathy; Quadriplegia; Paraplegia; Seizure Disorder Oncologic Medical History: Negative for: Received Chemotherapy; Received Radiation HBO Extended History Items Eyes: Eyes: Cataracts Glaucoma Immunizations Pneumococcal Vaccine: Received Pneumococcal Vaccination: Yes Implantable Devices None Hospitalization / Surgery History Type of Hospitalization/Surgery Colon cancer Family and Social History Cancer: Yes - Siblings,Paternal Grandparents; Diabetes: Yes - Siblings; Heart Disease: Yes - Mother,Father,Child,Paternal Grandparents; Hereditary Spherocytosis: No; Hypertension: Yes - Child; Kidney Disease: No; Lung Disease: No; Seizures: No; Stroke: No; Thyroid Problems: No; Tuberculosis: No; Former smoker - quit 40 yrs ago; Marital Status - Divorced; Alcohol Use: Never; Drug Use: No History; Caffeine Use: Rarely; Financial Concerns: No; Food, Clothing or Shelter Needs: No; Support System Lacking: No; Transportation Concerns:  No Physician Affirmation I have reviewed and agree with the above information. Electronic Signature(s) Signed: 05/04/2019 6:34:02 PM By: Worthy Keeler PA-C Signed: 05/06/2019 4:31:02 PM By: Harold Barban Entered By: Worthy Keeler on 05/04/2019 17:34:28 Sublett, Genevive Lemmie Evens ([id number]) -------------------------------------------------------------------------------- SuperBill Details Patient Name: [name] Date of Service: 05/04/2019 Medical Record Number: [id number] Patient Account Number: [id number] Date of Birth/Sex: 1927/12/07 (85 y.o. F) Treating RN: Harold Barban Primary Care Provider: Park Liter Other Clinician: Referring Provider: Park Liter Treating Provider/Extender: [name], [name] Weeks in Treatment: 7 Diagnosis Coding ICD-10 Codes Code Description I87.2 Venous insufficiency (chronic) (peripheral) S51.001A Unspecified open wound of right elbow, initial encounter Facility Procedures CPT4 Code: [id number] Description: [id number] - WOUND CARE VISIT-LEV 2 EST PT Modifier: Quantity: 1 Physician Procedures CPT4 Code: [id number] Description: [id number] - WC PHYS LEVEL 4 - EST PT ICD-10 Diagnosis Description I87.2 Venous insufficiency (chronic) (peripheral) S51.001A Unspecified open wound of right elbow, initial encounter Modifier: Quantity: 1 Electronic Signature(s) Signed: 05/04/2019 5:35:33 PM By: Worthy Keeler PA-C Entered By: Worthy Keeler on 05/04/2019 17:35:32

## 2019-05-10 DIAGNOSIS — J432 Centrilobular emphysema: Secondary | ICD-10-CM | POA: Diagnosis not present

## 2019-05-10 DIAGNOSIS — J449 Chronic obstructive pulmonary disease, unspecified: Secondary | ICD-10-CM | POA: Diagnosis not present

## 2019-05-12 ENCOUNTER — Telehealth: Payer: Self-pay

## 2019-05-14 ENCOUNTER — Encounter: Payer: Medicare Other | Attending: Physician Assistant | Admitting: Physician Assistant

## 2019-05-14 ENCOUNTER — Other Ambulatory Visit: Payer: Self-pay

## 2019-05-14 DIAGNOSIS — L97812 Non-pressure chronic ulcer of other part of right lower leg with fat layer exposed: Secondary | ICD-10-CM | POA: Insufficient documentation

## 2019-05-14 DIAGNOSIS — S51811A Laceration without foreign body of right forearm, initial encounter: Secondary | ICD-10-CM | POA: Insufficient documentation

## 2019-05-14 DIAGNOSIS — Z87891 Personal history of nicotine dependence: Secondary | ICD-10-CM | POA: Insufficient documentation

## 2019-05-14 DIAGNOSIS — I872 Venous insufficiency (chronic) (peripheral): Secondary | ICD-10-CM | POA: Diagnosis not present

## 2019-05-14 DIAGNOSIS — S51011A Laceration without foreign body of right elbow, initial encounter: Secondary | ICD-10-CM | POA: Insufficient documentation

## 2019-05-14 DIAGNOSIS — I4891 Unspecified atrial fibrillation: Secondary | ICD-10-CM | POA: Diagnosis not present

## 2019-05-14 DIAGNOSIS — S81802A Unspecified open wound, left lower leg, initial encounter: Secondary | ICD-10-CM | POA: Insufficient documentation

## 2019-05-14 DIAGNOSIS — J449 Chronic obstructive pulmonary disease, unspecified: Secondary | ICD-10-CM | POA: Insufficient documentation

## 2019-05-14 DIAGNOSIS — I10 Essential (primary) hypertension: Secondary | ICD-10-CM | POA: Diagnosis not present

## 2019-05-14 DIAGNOSIS — Z681 Body mass index (BMI) 19 or less, adult: Secondary | ICD-10-CM | POA: Diagnosis not present

## 2019-05-14 NOTE — Progress Notes (Addendum)
AQUIA, HETTINGER ([id number]) Visit Report for 05/14/2019 Chief Complaint Document Details Patient Name: [name], [name]. Date of Service: 05/14/2019 2:45 PM Medical Record Number: [id number] Patient Account Number: [id number] Date of Birth/Sex: [birth year] (85 y.o. F) Treating RN: [name] Primary Care Provider: Park Liter Other Clinician: Referring Provider: Park Liter Treating Provider/Extender: [name], [name] Weeks in Treatment: 9 Information Obtained from: Patient Chief Complaint Right elbow skin tear Electronic Signature(s) Signed: 05/14/2019 2:40:11 PM By: Worthy Keeler PA-C Entered By: Worthy Keeler on 05/14/2019 14:40:11 Kost, Shere H. ([id number]) -------------------------------------------------------------------------------- Debridement Details Patient Name: [name]. Date of Service: 05/14/2019 2:45 PM Medical Record Number: [id number] Patient Account Number: [id number] Date of Birth/Sex: 11-Jan-1928 (85 y.o. F) Treating RN: [name] Primary Care Provider: Park Liter Other Clinician: Referring Provider: Park Liter Treating Provider/Extender: [name], [name] Weeks in Treatment: 9 Debridement Performed for Wound #16 Right Elbow Assessment: Performed By: Physician STONE III, [name] E., PA-C Debridement Type: Debridement Level of Consciousness (Pre- Awake and Alert procedure): Pre-procedure Verification/Time Yes - 15:36 Out Taken: Start Time: 15:37 Pain Control: Lidocaine Total Area Debrided (L x W): 1.5 (cm) x 1.4 (cm) = 2.1 (cm) Tissue and other material Viable, Non-Viable, Skin: Epidermis debrided: Level: Skin/Epidermis Debridement Description: Selective/Open Wound Instrument: Curette, Forceps, Scissors Bleeding: None End Time: 15:39 Response to Treatment: Procedure was tolerated well Level of Consciousness Awake and Alert (Post-procedure): Post Debridement Measurements of Total Wound Length: (cm)  1.5 Width: (cm) 1.4 Depth: (cm) 0.1 Volume: (cm) 0.165 Character of Wound/Ulcer Post Debridement: Stable Post Procedure Diagnosis Same as Pre-procedure Electronic Signature(s) Signed: 05/14/2019 5:01:44 PM By: [name] Signed: 05/15/2019 6:01:04 PM By: Worthy Keeler PA-C Entered By: [name] on 05/14/2019 15:38:03 Mannis, Trachelle H. ([id number]) -------------------------------------------------------------------------------- HPI Details Patient Name: [name] Date of Service: 05/14/2019 2:45 PM Medical Record Number: [id number] Patient Account Number: [id number] Date of Birth/Sex: [birth year] (85 y.o. F) Treating RN: [name] Primary Care Provider: Park Liter Other Clinician: Referring Provider: Park Liter Treating Provider/Extender: [name], [name] Weeks in Treatment: 9 History of Present Illness HPI Description: 02/12/18 ADMISSION This is an 85 year old woman who is recently moved to [place] from [place]. Her story began in February where she fell on some ice suffering extensive lacerations to her bilateral lower extremities. She is able to show me extensive pictures of the right lower leg but with going to a wound care center and [place] 3 times a week these eventually closed over. She has been left with 1 area on the upper lateral left calf. She has been applying Neosporin to this and applying a bandage. Currently this measures 2 x 1.5 cm. The patient is not a diabetic. She is an ex-smoker quitting 40 years ago. She does have COPD by description. ABIs in our clinic were 1.32 on the right and 1.03 on the left 02/19/18; right lower leg wound which was initially trauma. The area that we look that last week is smaller. Still covered in a nonviable surface however. We are using Iodoflex 02/26/18; right lower leg wound which was initially trauma in the setting of chronic venous insufficiency. Surface of the wound looks much better healthy granulation  advancing epithelialization. We have good edema control 03/05/18; right lower leg wound which was initially trauma in the setting of chronic venous insufficiency. She continues to make nice progress here. We have good edema control oHe arrives today with a new traumatic laceration on her right dorsal forearm which she states happened while she  was putting on a sweatshirt 03/12/18; right lower leg wound as closed however it still looks vulnerable. She has chronic venous insufficiency. oThe new wound from last week a traumatic area on her right dorsal or arm unfortunately does not have a viable surface. I remove some nonviable skin and necrotic subcutaneous debris from the wound surface. Hemostasis with silver nitrate and direct pressure 03/19/18; right lower leg wound is closed. She has chronic venous insufficiency and will need ongoing compression oThe new wounds from 2 weeks ago on the right upper elbow is closed. The area on her right dorsal forearm continues to have a nonviable surface requiring debridement 03/26/18; we'll need to look into ongoing compression for her legs. 2 wounds on the right upper elbow is closed. The area on the right dorsal forearm looks better. Hydrofera Blue secondary to hypertrophic granulation 04/02/18; he has compression stockings although she did not wear them today. Severe chronic venous insufficiency. She has 2 wounds on the right upper elbow which I said were closed last week which actually are although they're very small and she has a smaller clean wound on the right dorsal forearm. We've been using Hydrofera Blue secondary to some upper granulation. All of this looks better 04/09/18-She is seen in follow up evaluation for a right elbow and right forearm skin tear. The right elbow is healed. We will continue with same treatment plan and she'll follow next week 04/16/18-She is seen in follow-up evaluation for right forearm skin tear, this is essentially healed. We will  cover with foam border and she will follow-up next week 04/23/18; the patient arrives with a new skin tear on her right dorsal arm just below the elbow. She got this while carrying a box. She has a linear skin tear. There is no depth I don't think this should've been sutured. There is a skin flap medially I'm not sure if this will be viable or not 04/30/18; the new skin tear from last week unfortunately has remained viable in terms of the skin flap. She has a small open area that looks healthy. Using moistened silver collagen 05/07/18; right dorsal arm skin tear. Nonviable tissue over the remnants of the wound. We've been using silver collagen 05/21/18; right dorsal arm skin tear. Nonviable tissue over a small remnant of the wound was washed off. We've been using silver collagen which we will continue 06/04/18; the right dorsal arm skin tear has healed. She arrives today with a traumatic wound just above the olecranon of her left elbow. This is a skin tear with a nonviable nonadherent flap which was removed. We'll use silver collagen here. Also noteworthy she arrives without her compression stockings 06/18/18; the area just above the olecranon of her left elbow. It is smaller and generally has a healthy surface. I was surprised EMRY, MICHELENA. ([id number]) to learn that the patient is actually changing this herself although. Appears this week she was putting some topical lidocaine on this that she bought over-the-counter at the drugstore. She did get supplies at home she didn't know how to put it on. She drove herself here today i.e. not eligible for home health Readmission: 11/04/18 on evaluation today patient presents for evaluation our clinic news to include the she has on her lower extremity which occurred as the result of having hit this on a piece of furniture. This was back in December. She states that her daughter has been trying to get her to come into have this evaluated here the wound  care  center but she was being somewhat stubborn. Nonetheless upon evaluation today the wound really appears to be potentially healed she does have some swelling of lower extremity which I think will come into play. With that being said he tells me that even yesterday this was still making clear fluid and therefore there may be some issues with continued problems with the swelling and drainage secondary to her venous stasis. Nonetheless overall wound appears to be doing fairly well which is good news. 11/11/18 on evaluation today patient actually appears to be doing excellent in the area of question that was green just the day before he saw her last time actually appears to have not drain that always in past week. I think this is done very well and at this point though she's had some discomfort I do not see any evidence of open wound which he needs to continue see wound care. Readmission: 03/10/19 on evaluation today patient presents for reevaluation here in our clinic concerning issues that she actually is having with the new problem on her right lower leg due to trauma that occurred when a car door shut on her leg last Thursday. This is not been quite a week and she does have several skin tears unfortunately that resulted from this injury. Fortunately there's no signs of active infection at this time. No fevers, chills, nausea, or vomiting noted at this time. She does have some hot tissue noted at this point that has me concerned about the possibility of needing to move this in order to allow the areas to heal appropriately. Fortunately there's no signs of active infection at this time. 03/17/19-Patient returns to clinic at 1 week, we have been using Prisma on the larger wounds with Kerlix and Xeroform. As many as 5 wounds were number last time 3 of which have closed, leaving the right proximal tibial and right distal medial leg wound both of which are looking good 03/24/19 on evaluation today patient  appears to be doing better in regard to her wounds at this time. Fortunately there is no sign of active infection at this time. No fevers chills noted. She's been tolerating the dressing changes without complication which is good news she's been performing these herself it sounds like home health is not going to be coming out it sounds as if they feel like she may be able to take care of yourself and not be technically homebound. Nonetheless though that is unfortunate it does seem like she's done fairly well with taking care of yourself from the standpoint of the dressings. 03/31/19 on evaluation today patient actually appears to be doing well with regard to her lower extremity ulcers. She had one area to heal today and in regard to her right lower extremity were she continues to have an open wound I did have to perform some sharp debridement today. Subsequently the patient still has an area of what appears to be possibly a small hematoma on the anterior portion of her left lower extremity. This is not open enough at this time but nonetheless does seem to show some evidence of hardening which is not necessarily a bad thing. Eventually this may pop off but again I'm not interested in really removing this prematurely. 04/07/19 on evaluation today patient actually appears to be doing well with regard to her lower extremity ulcers. She is been tolerating the dressing changes without complication. She seems to be making excellent progress which is great news. She does have one small new skin tear  area but the good news is she was able to pull the skin back over and it really looks like it is healing quite nicely. Overall that is just a very small region and again seems to be healing nicely. 04/14/19 upon evaluation today patient actually appears to be doing quite well with regard to the wounds on the right lower extremity. In fact everything appears to be healed as of today. This is excellent news. She does  however have the area on the left lower extremity which is showing some signs of loosening up we have been watching this area it is no longer fluctuant and in fact with the Betadine on it it is been slowly dry now but is now lifting up on the edge. There may be a wound underneath this region. 04/21/2019 on evaluation today patient appears to be doing excellent in regard to her bilateral lower extremities. In fact she has been tolerating the dressing changes without complication does not seem to show any signs of infection at this point. In fact based on what I am seeing currently I think she is completely healed. CALIE, SIEGEL ([id number]) 05/04/2019 on evaluation today patient actually appears to be doing quite well with regard to her right elbow. She has been tolerating the dressing changes without complication. Fortunately there is no signs of active infection at this time. Overall I am very pleased with how things are progressing. The elbow almost appears to be completely healed though it is not 100% today. 05/14/2019 on evaluation today patient actually appears to be doing well with regard to her elbow skin tear that we have been treating in fact this appears to be completely healed. Unfortunately she has a new skin tear noted as of today which actually occurred in the last week since I previously saw her. This again is unfortunate that it has been so long because I am not going to be able to actually pull the skin over where you have to trim this away and let it heal by second intent. This new wound is in the right elbow region as well but again separate from the original ulcer. Electronic Signature(s) Signed: 05/15/2019 5:50:31 PM By: Worthy Keeler PA-C Entered By: Worthy Keeler on 05/15/2019 17:50:31 Marchetta, [name] ([id number]) -------------------------------------------------------------------------------- Physical Exam Details Patient Name: Hickok, Tora H. Date of  Service: 05/14/2019 2:45 PM Medical Record Number: [id number] Patient Account Number: [id number] Date of Birth/Sex: 1928-08-23 (85 y.o. F) Treating RN: [name] Primary Care Provider: Park Liter Other Clinician: Referring Provider: Park Liter Treating Provider/Extender: [name], [name] Weeks in Treatment: 9 Constitutional Well-nourished and well-hydrated in no acute distress. Respiratory normal breathing without difficulty. clear to auscultation bilaterally. Cardiovascular regular rate and rhythm with normal S1, S2. Psychiatric this patient is able to make decisions and demonstrates good insight into disease process. Alert and Oriented x 3. pleasant and cooperative. Notes Patient's wound bed currently did show some necrotic tissue which was folded back on itself that I did have to carefully trim away with scissors and forceps she tolerated that today without complication and post debridement the wound bed appears to be doing well this is not very deep at all hopefully will still heal quite rapidly. Electronic Signature(s) Signed: 05/15/2019 5:51:00 PM By: Worthy Keeler PA-C Entered By: Worthy Keeler on 05/15/2019 17:51:00 Karapetyan, [name] ([id number]) -------------------------------------------------------------------------------- Physician Orders Details Patient Name: [name]. Date of Service: 05/14/2019 2:45 PM Medical Record Number: [id number] Patient Account Number: [id number] Date  of Birth/Sex: Dec 11, 1927 (85 y.o. F) Treating RN: [name] Primary Care Provider: Park Liter Other Clinician: Referring Provider: Park Liter Treating Provider/Extender: [name], [name] Weeks in Treatment: 9 Verbal / Phone Orders: No Diagnosis Coding ICD-10 Coding Code Description I87.2 Venous insufficiency (chronic) (peripheral) S51.001A Unspecified open wound of right elbow, initial encounter Wound Cleansing Wound #16 Right Elbow o Clean wound with  Normal Saline. Primary Wound Dressing Wound #16 Right Elbow o Xeroform Secondary Dressing Wound #16 Right Elbow o Dry Gauze o Conform/Kerlix o Other - stetch net Dressing Change Frequency Wound #16 Right Elbow o Change dressing every other day. Follow-up Appointments Wound #16 Right Elbow o Return Appointment in 1 week. Electronic Signature(s) Signed: 05/14/2019 5:01:44 PM By: [name] Signed: 05/15/2019 6:01:04 PM By: Worthy Keeler PA-C Entered By: [name] on 05/14/2019 15:40:10 Harvel, Zanaria HMarland Kitchen ([id number]) -------------------------------------------------------------------------------- Problem List Details Patient Name: Handel, Elie H. Date of Service: 05/14/2019 2:45 PM Medical Record Number: [id number] Patient Account Number: [id number] Date of Birth/Sex: 1928/01/12 (85 y.o. F) Treating RN: [name] Primary Care Provider: Park Liter Other Clinician: Referring Provider: Park Liter Treating Provider/Extender: [name], [name] Weeks in Treatment: 9 Active Problems ICD-10 Evaluated Encounter Code Description Active Date Today Diagnosis I87.2 Venous insufficiency (chronic) (peripheral) 03/10/2019 No Yes S51.001A Unspecified open wound of right elbow, initial encounter 04/28/2019 No Yes Inactive Problems Resolved Problems ICD-10 Code Description Active Date Resolved Date L97.812 Non-pressure chronic ulcer of other part of right lower leg with fat 03/10/2019 03/10/2019 layer exposed Electronic Signature(s) Signed: 05/14/2019 2:40:04 PM By: Worthy Keeler PA-C Entered By: Worthy Keeler on 05/14/2019 14:40:04 Ewing, Anette H. ([id number]) -------------------------------------------------------------------------------- Progress Note Details Patient Name: [name] Date of Service: 05/14/2019 2:45 PM Medical Record Number: [id number] Patient Account Number: [id number] Date of Birth/Sex: [birth year] (85 y.o. F) Treating RN:  [name] Primary Care Provider: Park Liter Other Clinician: Referring Provider: Park Liter Treating Provider/Extender: [name], [name] Weeks in Treatment: 9 Subjective Chief Complaint Information obtained from Patient Right elbow skin tear History of Present Illness (HPI) 02/12/18 ADMISSION This is an 85 year old woman who is recently moved to [place] from [place]. Her story began in February where she fell on some ice suffering extensive lacerations to her bilateral lower extremities. She is able to show me extensive pictures of the right lower leg but with going to a wound care center and [place] 3 times a week these eventually closed over. She has been left with 1 area on the upper lateral left calf. She has been applying Neosporin to this and applying a bandage. Currently this measures 2 x 1.5 cm. The patient is not a diabetic. She is an ex-smoker quitting 40 years ago. She does have COPD by description. ABIs in our clinic were 1.32 on the right and 1.03 on the left 02/19/18; right lower leg wound which was initially trauma. The area that we look that last week is smaller. Still covered in a nonviable surface however. We are using Iodoflex 02/26/18; right lower leg wound which was initially trauma in the setting of chronic venous insufficiency. Surface of the wound looks much better healthy granulation advancing epithelialization. We have good edema control 03/05/18; right lower leg wound which was initially trauma in the setting of chronic venous insufficiency. She continues to make nice progress here. We have good edema control He arrives today with a new traumatic laceration on her right dorsal forearm which she states happened while she was putting on a sweatshirt 03/12/18;  right lower leg wound as closed however it still looks vulnerable. She has chronic venous insufficiency. The new wound from last week a traumatic area on her right dorsal or arm unfortunately does not  have a viable surface. I remove some nonviable skin and necrotic subcutaneous debris from the wound surface. Hemostasis with silver nitrate and direct pressure 03/19/18; right lower leg wound is closed. She has chronic venous insufficiency and will need ongoing compression The new wounds from 2 weeks ago on the right upper elbow is closed. The area on her right dorsal forearm continues to have a nonviable surface requiring debridement 03/26/18; we'll need to look into ongoing compression for her legs. 2 wounds on the right upper elbow is closed. The area on the right dorsal forearm looks better. Hydrofera Blue secondary to hypertrophic granulation 04/02/18; he has compression stockings although she did not wear them today. Severe chronic venous insufficiency. She has 2 wounds on the right upper elbow which I said were closed last week which actually are although they're very small and she has a smaller clean wound on the right dorsal forearm. We've been using Hydrofera Blue secondary to some upper granulation. All of this looks better 04/09/18-She is seen in follow up evaluation for a right elbow and right forearm skin tear. The right elbow is healed. We will continue with same treatment plan and she'll follow next week 04/16/18-She is seen in follow-up evaluation for right forearm skin tear, this is essentially healed. We will cover with foam border and she will follow-up next week 04/23/18; the patient arrives with a new skin tear on her right dorsal arm just below the elbow. She got this while carrying a box. She has a linear skin tear. There is no depth I don't think this should've been sutured. There is a skin flap medially I'm not sure if this will be viable or not 04/30/18; the new skin tear from last week unfortunately has remained viable in terms of the skin flap. She has a small open area that looks healthy. Using moistened silver collagen 05/07/18; right dorsal arm skin tear. Nonviable tissue  over the remnants of the wound. We've been using silver collagen IVELIZ, KNOUSE ([id number]) 05/21/18; right dorsal arm skin tear. Nonviable tissue over a small remnant of the wound was washed off. We've been using silver collagen which we will continue 06/04/18; the right dorsal arm skin tear has healed. She arrives today with a traumatic wound just above the olecranon of her left elbow. This is a skin tear with a nonviable nonadherent flap which was removed. We'll use silver collagen here. Also noteworthy she arrives without her compression stockings 06/18/18; the area just above the olecranon of her left elbow. It is smaller and generally has a healthy surface. I was surprised to learn that the patient is actually changing this herself although. Appears this week she was putting some topical lidocaine on this that she bought over-the-counter at the drugstore. She did get supplies at home she didn't know how to put it on. She drove herself here today i.e. not eligible for home health Readmission: 11/04/18 on evaluation today patient presents for evaluation our clinic news to include the she has on her lower extremity which occurred as the result of having hit this on a piece of furniture. This was back in December. She states that her daughter has been trying to get her to come into have this evaluated here the wound care center but she was being somewhat  stubborn. Nonetheless upon evaluation today the wound really appears to be potentially healed she does have some swelling of lower extremity which I think will come into play. With that being said he tells me that even yesterday this was still making clear fluid and therefore there may be some issues with continued problems with the swelling and drainage secondary to her venous stasis. Nonetheless overall wound appears to be doing fairly well which is good news. 11/11/18 on evaluation today patient actually appears to be doing excellent in the  area of question that was green just the day before he saw her last time actually appears to have not drain that always in past week. I think this is done very well and at this point though she's had some discomfort I do not see any evidence of open wound which he needs to continue see wound care. Readmission: 03/10/19 on evaluation today patient presents for reevaluation here in our clinic concerning issues that she actually is having with the new problem on her right lower leg due to trauma that occurred when a car door shut on her leg last Thursday. This is not been quite a week and she does have several skin tears unfortunately that resulted from this injury. Fortunately there's no signs of active infection at this time. No fevers, chills, nausea, or vomiting noted at this time. She does have some hot tissue noted at this point that has me concerned about the possibility of needing to move this in order to allow the areas to heal appropriately. Fortunately there's no signs of active infection at this time. 03/17/19-Patient returns to clinic at 1 week, we have been using Prisma on the larger wounds with Kerlix and Xeroform. As many as 5 wounds were number last time 3 of which have closed, leaving the right proximal tibial and right distal medial leg wound both of which are looking good 03/24/19 on evaluation today patient appears to be doing better in regard to her wounds at this time. Fortunately there is no sign of active infection at this time. No fevers chills noted. She's been tolerating the dressing changes without complication which is good news she's been performing these herself it sounds like home health is not going to be coming out it sounds as if they feel like she may be able to take care of yourself and not be technically homebound. Nonetheless though that is unfortunate it does seem like she's done fairly well with taking care of yourself from the standpoint of the  dressings. 03/31/19 on evaluation today patient actually appears to be doing well with regard to her lower extremity ulcers. She had one area to heal today and in regard to her right lower extremity were she continues to have an open wound I did have to perform some sharp debridement today. Subsequently the patient still has an area of what appears to be possibly a small hematoma on the anterior portion of her left lower extremity. This is not open enough at this time but nonetheless does seem to show some evidence of hardening which is not necessarily a bad thing. Eventually this may pop off but again I'm not interested in really removing this prematurely. 04/07/19 on evaluation today patient actually appears to be doing well with regard to her lower extremity ulcers. She is been tolerating the dressing changes without complication. She seems to be making excellent progress which is great news. She does have one small new skin tear area but the good news  is she was able to pull the skin back over and it really looks like it is healing quite nicely. Overall that is just a very small region and again seems to be healing nicely. 04/14/19 upon evaluation today patient actually appears to be doing quite well with regard to the wounds on the right lower extremity. In fact everything appears to be healed as of today. This is excellent news. She does however have the area on the left lower extremity which is showing some signs of loosening up we have been watching this area it is no longer fluctuant and in fact with the Betadine on it it is been slowly dry now but is now lifting up on the edge. There may be a wound underneath Wernick, [place] ([id number]) this region. 04/21/2019 on evaluation today patient appears to be doing excellent in regard to her bilateral lower extremities. In fact she has been tolerating the dressing changes without complication does not seem to show any signs of infection at this  point. In fact based on what I am seeing currently I think she is completely healed. 05/04/2019 on evaluation today patient actually appears to be doing quite well with regard to her right elbow. She has been tolerating the dressing changes without complication. Fortunately there is no signs of active infection at this time. Overall I am very pleased with how things are progressing. The elbow almost appears to be completely healed though it is not 100% today. 05/14/2019 on evaluation today patient actually appears to be doing well with regard to her elbow skin tear that we have been treating in fact this appears to be completely healed. Unfortunately she has a new skin tear noted as of today which actually occurred in the last week since I previously saw her. This again is unfortunate that it has been so long because I am not going to be able to actually pull the skin over where you have to trim this away and let it heal by second intent. This new wound is in the right elbow region as well but again separate from the original ulcer. Patient History Information obtained from Patient. Family History Cancer - Siblings,Paternal Grandparents, Diabetes - Siblings, Heart Disease - Mother,Father,Child,Paternal Grandparents, Hypertension - Child, No family history of Hereditary Spherocytosis, Kidney Disease, Lung Disease, Seizures, Stroke, Thyroid Problems, Tuberculosis. Social History Former smoker - quit 40 yrs ago, Marital Status - Divorced, Alcohol Use - Never, Drug Use - No History, Caffeine Use - Rarely. Medical History Eyes Patient has history of Cataracts - surgery 2000, Glaucoma Respiratory Patient has history of Asthma, Chronic Obstructive Pulmonary Disease (COPD) Denies history of Pneumothorax, Sleep Apnea, Tuberculosis Cardiovascular Patient has history of Arrhythmia - a-fib, Hypertension Denies history of Angina, Congestive Heart Failure, Coronary Artery Disease, Deep Vein Thrombosis,  Hypotension, Myocardial Infarction, Peripheral Arterial Disease, Peripheral Venous Disease, Phlebitis, Vasculitis Endocrine Denies history of Type I Diabetes, Type II Diabetes Genitourinary Denies history of End Stage Renal Disease Immunological Denies history of Lupus Erythematosus, Raynaud s, Scleroderma Integumentary (Skin) Denies history of History of Burn, History of pressure wounds Musculoskeletal Denies history of Gout, Rheumatoid Arthritis, Osteoarthritis, Osteomyelitis Neurologic Denies history of Dementia, Neuropathy, Quadriplegia, Paraplegia, Seizure Disorder Oncologic Denies history of Received Chemotherapy, Received Radiation Psychiatric Denies history of Anorexia/bulimia, Confinement Anxiety Hospitalization/Surgery History - Colon cancer. GERELDINE, NUCCIO ([id number]) Review of Systems (ROS) Constitutional Symptoms (General Health) Denies complaints or symptoms of Fatigue, Fever, Chills, Marked Weight Change. Respiratory Denies complaints or symptoms of Chronic or  frequent coughs, Shortness of Breath. Cardiovascular Denies complaints or symptoms of Chest pain, LE edema. Psychiatric Denies complaints or symptoms of Anxiety, Claustrophobia. Objective Constitutional Well-nourished and well-hydrated in no acute distress. Vitals Time Taken: 2:45 PM, Height: 60 in, Weight: 92 lbs, BMI: 18, Temperature: 98.3 F, Pulse: 71 bpm, Respiratory Rate: 16 breaths/min, Blood Pressure: 143/43 mmHg. Respiratory normal breathing without difficulty. clear to auscultation bilaterally. Cardiovascular regular rate and rhythm with normal S1, S2. Psychiatric this patient is able to make decisions and demonstrates good insight into disease process. Alert and Oriented x 3. pleasant and cooperative. General Notes: Patient's wound bed currently did show some necrotic tissue which was folded back on itself that I did have to carefully trim away with scissors and forceps she tolerated  that today without complication and post debridement the wound bed appears to be doing well this is not very deep at all hopefully will still heal quite rapidly. Integumentary (Hair, Skin) Wound #15 status is Healed - Epithelialized. Original cause of wound was Trauma. The wound is located on the Right Elbow. The wound measures 0cm length x 0cm width x 0cm depth; 0cm^2 area and 0cm^3 volume. Wound #16 status is Open. Original cause of wound was Trauma. The wound is located on the Right Elbow. The wound measures 1.5cm length x 1.4cm width x 0.1cm depth; 1.649cm^2 area and 0.165cm^3 volume. There is Fat Layer (Subcutaneous Tissue) Exposed exposed. There is no tunneling or undermining noted. There is a medium amount of serous drainage noted. The wound margin is flat and intact. There is medium (34-66%) pink granulation within the wound bed. There is a medium (34-66%) amount of necrotic tissue within the wound bed including Adherent Slough. Assessment Stanard, Evoleht H. ([id number]) Active Problems ICD-10 Venous insufficiency (chronic) (peripheral) Unspecified open wound of right elbow, initial encounter Procedures Wound #16 Pre-procedure diagnosis of Wound #16 is a Skin Tear located on the Right Elbow . There was a Selective/Open Wound Skin/Epidermis Debridement with a total area of 2.1 sq cm performed by STONE III, [name] E., PA-C. With the following instrument(s): Curette, Forceps, and Scissors to remove Viable and Non-Viable tissue/material. Material removed includes Skin: Epidermis after achieving pain control using Lidocaine. A time out was conducted at 15:36, prior to the start of the procedure. There was no bleeding. The procedure was tolerated well. Post Debridement Measurements: 1.5cm length x 1.4cm width x 0.1cm depth; 0.165cm^3 volume. Character of Wound/Ulcer Post Debridement is stable. Post procedure Diagnosis Wound #16: Same as Pre-Procedure Plan Wound Cleansing: Wound #16 Right  Elbow: Clean wound with Normal Saline. Primary Wound Dressing: Wound #16 Right Elbow: Xeroform Secondary Dressing: Wound #16 Right Elbow: Dry Gauze Conform/Kerlix Other - stetch net Dressing Change Frequency: Wound #16 Right Elbow: Change dressing every other day. Follow-up Appointments: Wound #16 Right Elbow: Return Appointment in 1 week. 1. I would recommend currently that we initiate treatment with Xeroform gauze to the new wound and the patient is in agreement the plan. 2. I would also suggest currently that we go ahead and have her use stretch netting and an ABD pad to cover over this so as to avoid any adhesive to her skin. We will see patient back for reevaluation in 1 week here in the clinic. If anything worsens or changes patient will contact our office for additional recommendations. SHIREEN, DOTY ([id number]) Electronic Signature(s) Signed: 05/15/2019 5:51:35 PM By: Worthy Keeler PA-C Entered By: Worthy Keeler on 05/15/2019 17:51:35 BEYONCA, MASSIE ([id number]) -------------------------------------------------------------------------------- ROS/PFSH Details Patient  Name: KEALY, SNOPEK. Date of Service: 05/14/2019 2:45 PM Medical Record Number: [id number] Patient Account Number: [id number] Date of Birth/Sex: December 15, 1927 (85 y.o. F) Treating RN: [name] Primary Care Provider: Park Liter Other Clinician: Referring Provider: Park Liter Treating Provider/Extender: [name], [name] Weeks in Treatment: 9 Information Obtained From Patient Constitutional Symptoms (General Health) Complaints and Symptoms: Negative for: Fatigue; Fever; Chills; Marked Weight Change Respiratory Complaints and Symptoms: Negative for: Chronic or frequent coughs; Shortness of Breath Medical History: Positive for: Asthma; Chronic Obstructive Pulmonary Disease (COPD) Negative for: Pneumothorax; Sleep Apnea; Tuberculosis Cardiovascular Complaints and  Symptoms: Negative for: Chest pain; LE edema Medical History: Positive for: Arrhythmia - a-fib; Hypertension Negative for: Angina; Congestive Heart Failure; Coronary Artery Disease; Deep Vein Thrombosis; Hypotension; Myocardial Infarction; Peripheral Arterial Disease; Peripheral Venous Disease; Phlebitis; Vasculitis Psychiatric Complaints and Symptoms: Negative for: Anxiety; Claustrophobia Medical History: Negative for: Anorexia/bulimia; Confinement Anxiety Eyes Medical History: Positive for: Cataracts - surgery 2000; Glaucoma Endocrine Medical History: Negative for: Type I Diabetes; Type II Diabetes Genitourinary Medical History: Negative for: End Stage Renal Disease [place]au, Addalynne H. ([id number]) Immunological Medical History: Negative for: Lupus Erythematosus; Raynaudos; Scleroderma Integumentary (Skin) Medical History: Negative for: History of Burn; History of pressure wounds Musculoskeletal Medical History: Negative for: Gout; Rheumatoid Arthritis; Osteoarthritis; Osteomyelitis Neurologic Medical History: Negative for: Dementia; Neuropathy; Quadriplegia; Paraplegia; Seizure Disorder Oncologic Medical History: Negative for: Received Chemotherapy; Received Radiation HBO Extended History Items Eyes: Eyes: Cataracts Glaucoma Immunizations Pneumococcal Vaccine: Received Pneumococcal Vaccination: Yes Implantable Devices None Hospitalization / Surgery History Type of Hospitalization/Surgery Colon cancer Family and Social History Cancer: Yes - Siblings,Paternal Grandparents; Diabetes: Yes - Siblings; Heart Disease: Yes - Mother,Father,Child,Paternal Grandparents; Hereditary Spherocytosis: No; Hypertension: Yes - Child; Kidney Disease: No; Lung Disease: No; Seizures: No; Stroke: No; Thyroid Problems: No; Tuberculosis: No; Former smoker - quit 40 yrs ago; Marital Status - Divorced; Alcohol Use: Never; Drug Use: No History; Caffeine Use: Rarely; Financial Concerns: No;  Food, Clothing or Shelter Needs: No; Support System Lacking: No; Transportation Concerns: No Physician Affirmation I have reviewed and agree with the above information. Electronic Signature(s) Signed: 05/15/2019 6:01:04 PM By: Worthy Keeler PA-C Signed: 05/18/2019 11:57:01 AM By: [name] Entered By: Worthy Keeler on 05/15/2019 17:50:50 Huckeba, Anaelle HMarland Kitchen ([id number]) -------------------------------------------------------------------------------- SuperBill Details Patient Name: [name] Date of Service: 05/14/2019 Medical Record Number: [id number] Patient Account Number: [id number] Date of Birth/Sex: August 19, 1928 (85 y.o. F) Treating RN: [name] Primary Care Provider: Park Liter Other Clinician: Referring Provider: Park Liter Treating Provider/Extender: [name], [name] Weeks in Treatment: 9 Diagnosis Coding ICD-10 Codes Code Description I87.2 Venous insufficiency (chronic) (peripheral) S51.001A Unspecified open wound of right elbow, initial encounter Facility Procedures CPT4 Code: [id number] Description: [phone] - DEBRIDE WOUND 1ST 20 SQ CM OR < ICD-10 Diagnosis Description S51.001A Unspecified open wound of right elbow, initial encounter Modifier: Quantity: 1 Physician Procedures CPT4 [id number] Description: [id number] - WC PHYS LEVEL 4 - EST PT ICD-10 Diagnosis Description S51.001A Unspecified open wound of right elbow, initial encounter Modifier: 25 Quantity: 1 CPT4 Code: [id number] Description: [id number] - WC PHYS DEBR WO ANESTH 20 SQ CM ICD-10 Diagnosis Description S51.001A Unspecified open wound of right elbow, initial encounter Modifier: Quantity: 1 Electronic Signature(s) Signed: 05/15/2019 5:51:54 PM By: Worthy Keeler PA-C Entered By: Worthy Keeler on 05/15/2019 17:51:54

## 2019-05-14 NOTE — Progress Notes (Addendum)
[name] Visit Report for 05/14/2019 Arrival Information Details Patient Name: [name]. Date of Service: 05/14/2019 2:45 PM Medical Record Number: [id number] Patient Account Number: [id number] Date of Birth/Sex: March 30, 1928 (85 y.o. F) Treating RN: [name] Primary Care Leisa Gault: Park Liter Other Clinician: Referring Jarom Govan: Park Liter Treating Lilian Fuhs/Extender: [name], [name] Weeks in Treatment: 9 Visit Information History Since Last Visit Added or deleted any medications: No Patient Arrived: Ambulatory Any new allergies or adverse reactions: No Arrival Time: 14:41 Had a fall or experienced change in No Accompanied By: self activities of daily living that may affect Transfer Assistance: None risk of falls: Patient Identification Verified: Yes Signs or symptoms of abuse/neglect since last visito No Secondary Verification Process Yes Hospitalized since last visit: No Completed: Implantable device outside of the clinic excluding No Patient Requires Transmission-Based No cellular tissue based products placed in the center Precautions: since last visit: Patient Has Alerts: Yes Has Dressing in Place as Prescribed: Yes Patient Alerts: Patient on Blood Pain Present Now: No Thinner 81mg  aspirin Electronic Signature(s) Signed: 05/14/2019 5:06:05 PM By: Lorine Bears RCP, RRT, CHT Entered By: Lorine Bears on 05/14/2019 14:45:06 Pisani, [name] -------------------------------------------------------------------------------- Encounter Discharge Information Details Patient Name: Carol Berger H. Date of Service: 05/14/2019 2:45 PM Medical Record Number: [id number] Patient Account Number: [id number] Date of Birth/Sex: April 05, 1928 (85 y.o. F) Treating RN: [name] Primary Care Freida Nebel: Park Liter Other Clinician: Referring Thaine Garriga: Park Liter Treating Derrich Gaby/Extender:  [name], [name] Weeks in Treatment: 9 Encounter Discharge Information Items Post Procedure Vitals Discharge Condition: Stable Temperature (F): 98.3 Ambulatory Status: Ambulatory Pulse (bpm): 71 Discharge Destination: Home Respiratory Rate (breaths/min): 16 Transportation: Private Auto Blood Pressure (mmHg): 143/43 Accompanied By: self Schedule Follow-up Appointment: Yes Clinical Summary of Care: Electronic Signature(s) Signed: 05/14/2019 5:01:44 PM By: [name] Entered By: [name] on 05/14/2019 15:51:34 Fulco, [name] ([id number]) -------------------------------------------------------------------------------- Lower Extremity Assessment Details Patient Name: [name], [name] Date of Service: 05/14/2019 2:45 PM Medical Record Number: [id number] Patient Account Number: [id number] Date of Birth/Sex: December 23, 1927 (85 y.o. F) Treating RN: Montey Hora Primary Care Roselyn Doby: Park Liter Other Clinician: Referring Esmirna Ravan: Park Liter Treating Abhijot Straughter/Extender: [name], [name] Weeks in Treatment: 9 Electronic Signature(s) Signed: 05/14/2019 4:59:38 PM By: Montey Hora Entered By: Montey Hora on 05/14/2019 15:00:05 Gerety, [name] -------------------------------------------------------------------------------- Multi Wound Chart Details Patient Name: [name], [name] Date of Service: 05/14/2019 2:45 PM Medical Record Number: [id number] Patient Account Number: [id number] Date of Birth/Sex: August 22, 1928 (85 y.o. F) Treating RN: [name] Primary Care Izear Pine: Park Liter Other Clinician: Referring Rambo Sarafian: Park Liter Treating Dajha Urquilla/Extender: [name], [name] Weeks in Treatment: 9 Vital Signs Height(in): 60 Pulse(bpm): 13 Weight(lbs): 37 Blood Pressure(mmHg): 143/43 Body Mass Index(BMI): 18 Temperature(F): 98.3 Respiratory Rate 16 (breaths/min): Photos: [N/A:N/A] Wound Location: Right Elbow Right Elbow  N/A Wounding Event: Trauma Trauma N/A Primary Etiology: Trauma, Other Skin Tear N/A Comorbid History: N/A Cataracts, Glaucoma, Asthma, N/A Chronic Obstructive Pulmonary Disease (COPD), Arrhythmia, Hypertension Date Acquired: 04/28/2019 05/11/2019 N/A Weeks of Treatment: 2 0 N/A Wound Status: Healed - Epithelialized Open N/A Measurements L x W x D 0x0x0 1.5x1.4x0.1 N/A (cm) Area (cm) : 0 1.649 N/A Volume (cm) : 0 0.165 N/A % Reduction in Area: 100.00% N/A N/A % Reduction in Volume: 100.00% N/A N/A Classification: Partial Thickness Full Thickness Without N/A Exposed Support Structures Exudate Amount: N/A Medium N/A Exudate Type: N/A Serous N/A Exudate Color: N/A amber N/A Wound Margin: N/A Flat and Intact N/A Granulation Amount:  N/A Medium (34-66%) N/A Granulation Quality: N/A Pink N/A Necrotic Amount: N/A Medium (34-66%) N/A Epithelialization: N/A None N/A Treatment Notes RAYNESHA, LANGRECK ([id number]) Electronic Signature(s) Signed: 05/14/2019 5:01:44 PM By: [name] Entered By: [name] on 05/14/2019 15:36:13 Heinsohn, [name] ([id number]) -------------------------------------------------------------------------------- [place] Details Patient Name: [name]. Date of Service: 05/14/2019 2:45 PM Medical Record Number: [id number] Patient Account Number: [id number] Date of Birth/Sex: [birth year] (85 y.o. F) Treating RN: [name] Primary Care Zyier Dykema: Park Liter Other Clinician: Referring Montgomery Rothlisberger: Park Liter Treating Mayre Bury/Extender: [name], [name] Weeks in Treatment: 9 Active Inactive Abuse / Safety / Falls / Self Care Management Nursing Diagnoses: Potential for falls Goals: Patient will not experience any injury related to falls Date Initiated: 03/10/2019 Target Resolution Date: 06/13/2019 Goal Status: Active Interventions: Assess fall risk on admission and as needed Notes: Orientation to the Wound Care  Program Nursing Diagnoses: Knowledge deficit related to the wound healing center program Goals: Patient/caregiver will verbalize understanding of the [place] Program Date Initiated: 03/10/2019 Target Resolution Date: 06/13/2019 Goal Status: Active Interventions: Provide education on orientation to the wound center Notes: Wound/Skin Impairment Nursing Diagnoses: Impaired tissue integrity Goals: Ulcer/skin breakdown will heal within 14 weeks Date Initiated: 03/10/2019 Target Resolution Date: 06/13/2019 Goal Status: Active Interventions: Assess patient/caregiver ability to obtain necessary supplies Fleites, Dillan H. ([id number]) Assess patient/caregiver ability to perform ulcer/skin care regimen upon admission and as needed Assess ulceration(s) every visit Notes: Electronic Signature(s) Signed: 05/14/2019 5:01:44 PM By: [name] Entered By: [name] on 05/14/2019 15:36:03 Weger, Ona H. ([id number]) -------------------------------------------------------------------------------- Pain Assessment Details Patient Name: [name]. Date of Service: 05/14/2019 2:45 PM Medical Record Number: [id number] Patient Account Number: [id number] Date of Birth/Sex: 09/10/1927 (85 y.o. F) Treating RN: [name] Primary Care Modene Andy: Park Liter Other Clinician: Referring Yailene Badia: Park Liter Treating Berenize Gatlin/Extender: [name], [name] Weeks in Treatment: 9 Active Problems Location of Pain Severity and Description of Pain Patient Has Paino No Site Locations Pain Management and Medication Current Pain Management: Electronic Signature(s) Signed: 05/14/2019 5:01:44 PM By: [name] Signed: 05/14/2019 5:06:05 PM By: Lorine Bears RCP, RRT, CHT Entered By: Lorine Bears on 05/14/2019 14:45:19 Curvin, Janielle Lemmie Wise  ([id number]) -------------------------------------------------------------------------------- Patient/Caregiver Education Details Patient Name: [name]. Date of Service: 05/14/2019 2:45 PM Medical Record Number: [id number] Patient Account Number: [id number] Date of Birth/Gender: 1928-06-30 (85 y.o. F) Treating RN: [name] Primary Care Physician: Park Liter Other Clinician: Referring Physician: Park Liter Treating Physician/Extender: Sharalyn Ink in Treatment: 9 Education Assessment Education Provided To: Patient Education Topics Provided Wound/Skin Impairment: Handouts: Caring for Your Ulcer Methods: Demonstration, Explain/Verbal Responses: State content correctly Electronic Signature(s) Signed: 05/14/2019 5:01:44 PM By: [name] Entered By: [name] on 05/14/2019 15:43:09 [name], [name] ([id number]) -------------------------------------------------------------------------------- Wound Assessment Details Patient Name: [name], [name] Date of Service: 05/14/2019 2:45 PM Medical Record Number: [id number] Patient Account Number: [id number] Date of Birth/Sex: [birth year] (85 y.o. F) Treating RN: Montey Hora Primary Care Prentis Langdon: Park Liter Other Clinician: Referring Camren Henthorn: Park Liter Treating Kiowa Hollar/Extender: [name], [name] Weeks in Treatment: 9 Wound Status Wound Number: 15 Primary Etiology: Trauma, Other Wound Location: Right Elbow Wound Status: Healed - Epithelialized Wounding Event: Trauma Date Acquired: 04/28/2019 Weeks Of Treatment: 2 Clustered Wound: No Photos Photo Uploaded By: Montey Hora on 05/14/2019 15:04:25 Wound Measurements Length: (cm) 0 Width: (cm) 0 Depth: (cm) 0 Area: (cm) 0 Volume: (cm) 0 % Reduction in Area: 100% % Reduction in Volume: 100% Wound Description  Classification: Partial Thickness Electronic Signature(s) Signed: 05/14/2019 4:59:38 PM By: Montey Hora Entered By:  Montey Hora on 05/14/2019 15:01:26 Voth, Emonee HMarland Kitchen ([id number]) -------------------------------------------------------------------------------- Wound Assessment Details Patient Name: [name], [name] Date of Service: 05/14/2019 2:45 PM Medical Record Number: [id number] Patient Account Number: [id number] Date of Birth/Sex: March 04, 1928 (85 y.o. F) Treating RN: Montey Hora Primary Care Rodger Giangregorio: Park Liter Other Clinician: Referring Ashonte Angelucci: Park Liter Treating Payson Crumby/Extender: [name], [name] Weeks in Treatment: 9 Wound Status Wound Number: 16 Primary Skin Tear Etiology: Wound Location: Right Elbow Wound Open Wounding Event: Trauma Status: Date Acquired: 05/11/2019 Comorbid Cataracts, Glaucoma, Asthma, Chronic Weeks Of Treatment: 0 History: Obstructive Pulmonary Disease (COPD), Clustered Wound: No Arrhythmia, Hypertension Photos Wound Measurements Length: (cm) 1.5 Width: (cm) 1.4 Depth: (cm) 0.1 Area: (cm) 1.649 Volume: (cm) 0.165 % Reduction in Area: % Reduction in Volume: Epithelialization: None Tunneling: No Undermining: No Wound Description Full Thickness Without Exposed Support Foul Odo Classification: Structures Slough/F Wound Margin: Flat and Intact Exudate Medium Amount: Exudate Type: Serous Exudate Color: amber r After Cleansing: No ibrino Yes Wound Bed Granulation Amount: Medium (34-66%) Exposed Structure Granulation Quality: Pink Fascia Exposed: No Necrotic Amount: Medium (34-66%) Fat Layer (Subcutaneous Tissue) Exposed: Yes Necrotic Quality: Adherent Slough Tendon Exposed: No Muscle Exposed: No Joint Exposed: No Bone Exposed: No [name], [name] ([id number]) Treatment Notes Wound #16 (Right Elbow) Notes xeroform, rolled gauze tape stretchy Electronic Signature(s) Signed: 05/14/2019 4:59:38 PM By: Montey Hora Entered By: Montey Hora on 05/14/2019 15:03:55 [name], [name]  ([id number]) -------------------------------------------------------------------------------- Vitals Details Patient Name: Carol Berger H. Date of Service: 05/14/2019 2:45 PM Medical Record Number: [id number] Patient Account Number: [id number] Date of Birth/Sex: [birth year] (85 y.o. F) Treating RN: [name] Primary Care Aryahna Spagna: Park Liter Other Clinician: Referring Garlon Tuggle: Park Liter Treating Michaline Kindig/Extender: [name], [name] Weeks in Treatment: 9 Vital Signs Time Taken: 14:45 Temperature (F): 98.3 Height (in): 60 Pulse (bpm): 71 Weight (lbs): 92 Respiratory Rate (breaths/min): 16 Body Mass Index (BMI): 18 Blood Pressure (mmHg): 143/43 Reference Range: 80 - 120 mg / dl Electronic Signature(s) Signed: 05/14/2019 5:06:05 PM By: Lorine Bears RCP, RRT, CHT Entered By: Lorine Bears on 05/14/2019 14:45:55

## 2019-05-19 ENCOUNTER — Other Ambulatory Visit: Payer: Self-pay

## 2019-05-19 ENCOUNTER — Encounter: Payer: Medicare Other | Admitting: Physician Assistant

## 2019-05-19 DIAGNOSIS — J449 Chronic obstructive pulmonary disease, unspecified: Secondary | ICD-10-CM | POA: Diagnosis not present

## 2019-05-19 DIAGNOSIS — I10 Essential (primary) hypertension: Secondary | ICD-10-CM | POA: Diagnosis not present

## 2019-05-19 DIAGNOSIS — S51011A Laceration without foreign body of right elbow, initial encounter: Secondary | ICD-10-CM | POA: Diagnosis not present

## 2019-05-19 DIAGNOSIS — S51811A Laceration without foreign body of right forearm, initial encounter: Secondary | ICD-10-CM | POA: Diagnosis not present

## 2019-05-19 DIAGNOSIS — Z87891 Personal history of nicotine dependence: Secondary | ICD-10-CM | POA: Diagnosis not present

## 2019-05-19 DIAGNOSIS — L97812 Non-pressure chronic ulcer of other part of right lower leg with fat layer exposed: Secondary | ICD-10-CM | POA: Diagnosis not present

## 2019-05-19 DIAGNOSIS — I872 Venous insufficiency (chronic) (peripheral): Secondary | ICD-10-CM | POA: Diagnosis not present

## 2019-05-19 DIAGNOSIS — S81802A Unspecified open wound, left lower leg, initial encounter: Secondary | ICD-10-CM | POA: Diagnosis not present

## 2019-05-19 DIAGNOSIS — I4891 Unspecified atrial fibrillation: Secondary | ICD-10-CM | POA: Diagnosis not present

## 2019-05-19 NOTE — Progress Notes (Addendum)
Carol, Wise ([id number]) Visit Report for 05/19/2019 Chief Complaint Document Details Patient Name: [name]. Date of Service: 05/19/2019 1:00 PM Medical Record Number: [id number] Patient Account Number: [id number] Date of Birth/Sex: Aug 29, 1928 (85 y.o. F) Treating RN: Montey Hora Primary Care Provider: Park Liter Other Clinician: Referring Provider: Park Liter Treating Provider/Extender: [name], [name] Weeks in Treatment: 10 Information Obtained from: Patient Chief Complaint Right elbow skin tear Electronic Signature(s) Signed: 05/21/2019 2:10:09 AM By: Worthy Keeler PA-C Previous Signature: 05/19/2019 1:54:55 PM Version By: Worthy Keeler PA-C Entered By: Worthy Keeler on 05/21/2019 02:09:00 [name] ([id number]) -------------------------------------------------------------------------------- HPI Details Patient Name: [name]. Date of Service: 05/19/2019 1:00 PM Medical Record Number: [id number] Patient Account Number: [id number] Date of Birth/Sex: [birth year] (85 y.o. F) Treating RN: Montey Hora Primary Care Provider: Park Liter Other Clinician: Referring Provider: Park Liter Treating Provider/Extender: [name], [name] Weeks in Treatment: 10 History of Present Illness HPI Description: 02/12/18 ADMISSION This is an 85 year old woman who is recently moved to [place] from [place]. Her story began in February where she fell on some ice suffering extensive lacerations to her bilateral lower extremities. She is able to show me extensive pictures of the right lower leg but with going to a wound care center and [place] 3 times a week these eventually closed over. She has been left with 1 area on the upper lateral left calf. She has been applying Neosporin to this and applying a bandage. Currently this measures 2 x 1.5 cm. The patient is not a diabetic. She is an ex-smoker quitting 40 years ago. She does have COPD by  description. ABIs in our clinic were 1.32 on the right and 1.03 on the left 02/19/18; right lower leg wound which was initially trauma. The area that we look that last week is smaller. Still covered in a nonviable surface however. We are using Iodoflex 02/26/18; right lower leg wound which was initially trauma in the setting of chronic venous insufficiency. Surface of the wound looks much better healthy granulation advancing epithelialization. We have good edema control 03/05/18; right lower leg wound which was initially trauma in the setting of chronic venous insufficiency. She continues to make nice progress here. We have good edema control oHe arrives today with a new traumatic laceration on her right dorsal forearm which she states happened while she was putting on a sweatshirt 03/12/18; right lower leg wound as closed however it still looks vulnerable. She has chronic venous insufficiency. oThe new wound from last week a traumatic area on her right dorsal or arm unfortunately does not have a viable surface. I remove some nonviable skin and necrotic subcutaneous debris from the wound surface. Hemostasis with silver nitrate and direct pressure 03/19/18; right lower leg wound is closed. She has chronic venous insufficiency and will need ongoing compression oThe new wounds from 2 weeks ago on the right upper elbow is closed. The area on her right dorsal forearm continues to have a nonviable surface requiring debridement 03/26/18; we'll need to look into ongoing compression for her legs. 2 wounds on the right upper elbow is closed. The area on the right dorsal forearm looks better. Hydrofera Blue secondary to hypertrophic granulation 04/02/18; he has compression stockings although she did not wear them today. Severe chronic venous insufficiency. She has 2 wounds on the right upper elbow which I said were closed last week which actually are although they're very small and she has a smaller clean wound on  the  right dorsal forearm. We've been using Hydrofera Blue secondary to some upper granulation. All of this looks better 04/09/18-She is seen in follow up evaluation for a right elbow and right forearm skin tear. The right elbow is healed. We will continue with same treatment plan and she'll follow next week 04/16/18-She is seen in follow-up evaluation for right forearm skin tear, this is essentially healed. We will cover with foam border and she will follow-up next week 04/23/18; the patient arrives with a new skin tear on her right dorsal arm just below the elbow. She got this while carrying a box. She has a linear skin tear. There is no depth I don't think this should've been sutured. There is a skin flap medially I'm not sure if this will be viable or not 04/30/18; the new skin tear from last week unfortunately has remained viable in terms of the skin flap. She has a small open area that looks healthy. Using moistened silver collagen 05/07/18; right dorsal arm skin tear. Nonviable tissue over the remnants of the wound. We've been using silver collagen 05/21/18; right dorsal arm skin tear. Nonviable tissue over a small remnant of the wound was washed off. We've been using silver collagen which we will continue 06/04/18; the right dorsal arm skin tear has healed. She arrives today with a traumatic wound just above the olecranon of her left elbow. This is a skin tear with a nonviable nonadherent flap which was removed. We'll use silver collagen here. Also noteworthy she arrives without her compression stockings 06/18/18; the area just above the olecranon of her left elbow. It is smaller and generally has a healthy surface. I was surprised [name]. ([id number]) to learn that the patient is actually changing this herself although. Appears this week she was putting some topical lidocaine on this that she bought over-the-counter at the drugstore. She did get supplies at home she didn't know how to  put it on. She drove herself here today i.e. not eligible for home health Readmission: 11/04/18 on evaluation today patient presents for evaluation our clinic news to include the she has on her lower extremity which occurred as the result of having hit this on a piece of furniture. This was back in December. She states that her daughter has been trying to get her to come into have this evaluated here the wound care center but she was being somewhat stubborn. Nonetheless upon evaluation today the wound really appears to be potentially healed she does have some swelling of lower extremity which I think will come into play. With that being said he tells me that even yesterday this was still making clear fluid and therefore there may be some issues with continued problems with the swelling and drainage secondary to her venous stasis. Nonetheless overall wound appears to be doing fairly well which is good news. 11/11/18 on evaluation today patient actually appears to be doing excellent in the area of question that was green just the day before he saw her last time actually appears to have not drain that always in past week. I think this is done very well and at this point though she's had some discomfort I do not see any evidence of open wound which he needs to continue see wound care. Readmission: 03/10/19 on evaluation today patient presents for reevaluation here in our clinic concerning issues that she actually is having with the new problem on her right lower leg due to trauma that occurred when a car door shut  on her leg last Thursday. This is not been quite a week and she does have several skin tears unfortunately that resulted from this injury. Fortunately there's no signs of active infection at this time. No fevers, chills, nausea, or vomiting noted at this time. She does have some hot tissue noted at this point that has me concerned about the possibility of needing to move this in order to allow  the areas to heal appropriately. Fortunately there's no signs of active infection at this time. 03/17/19-Patient returns to clinic at 1 week, we have been using Prisma on the larger wounds with Kerlix and Xeroform. As many as 5 wounds were number last time 3 of which have closed, leaving the right proximal tibial and right distal medial leg wound both of which are looking good 03/24/19 on evaluation today patient appears to be doing better in regard to her wounds at this time. Fortunately there is no sign of active infection at this time. No fevers chills noted. She's been tolerating the dressing changes without complication which is good news she's been performing these herself it sounds like home health is not going to be coming out it sounds as if they feel like she may be able to take care of yourself and not be technically homebound. Nonetheless though that is unfortunate it does seem like she's done fairly well with taking care of yourself from the standpoint of the dressings. 03/31/19 on evaluation today patient actually appears to be doing well with regard to her lower extremity ulcers. She had one area to heal today and in regard to her right lower extremity were she continues to have an open wound I did have to perform some sharp debridement today. Subsequently the patient still has an area of what appears to be possibly a small hematoma on the anterior portion of her left lower extremity. This is not open enough at this time but nonetheless does seem to show some evidence of hardening which is not necessarily a bad thing. Eventually this may pop off but again I'm not interested in really removing this prematurely. 04/07/19 on evaluation today patient actually appears to be doing well with regard to her lower extremity ulcers. She is been tolerating the dressing changes without complication. She seems to be making excellent progress which is great news. She does have one small new skin tear  area but the good news is she was able to pull the skin back over and it really looks like it is healing quite nicely. Overall that is just a very small region and again seems to be healing nicely. 04/14/19 upon evaluation today patient actually appears to be doing quite well with regard to the wounds on the right lower extremity. In fact everything appears to be healed as of today. This is excellent news. She does however have the area on the left lower extremity which is showing some signs of loosening up we have been watching this area it is no longer fluctuant and in fact with the Betadine on it it is been slowly dry now but is now lifting up on the edge. There may be a wound underneath this region. 04/21/2019 on evaluation today patient appears to be doing excellent in regard to her bilateral lower extremities. In fact she has been tolerating the dressing changes without complication does not seem to show any signs of infection at this point. In fact based on what I am seeing currently I think she is completely healed. Pittinger, Ercilia  [name] ([id number]) 05/04/2019 on evaluation today patient actually appears to be doing quite well with regard to her right elbow. She has been tolerating the dressing changes without complication. Fortunately there is no signs of active infection at this time. Overall I am very pleased with how things are progressing. The elbow almost appears to be completely healed though it is not 100% today. 05/14/2019 on evaluation today patient actually appears to be doing well with regard to her elbow skin tear that we have been treating in fact this appears to be completely healed. Unfortunately she has a new skin tear noted as of today which actually occurred in the last week since I previously saw her. This again is unfortunate that it has been so long because I am not going to be able to actually pull the skin over where you have to trim this away and let it heal by second  intent. This new wound is in the right elbow region as well but again separate from the original ulcer. 05/19/2019 on evaluation today patient appears to be doing well with regard to her skin tear on her elbow. She has been tolerating the dressing changes without complication. Fortunately there is no signs of active infection at this time. No fever chills noted. Overall I am very pleased with how things appear today. She is not having any significant pain which is also excellent news. Electronic Signature(s) Signed: 05/21/2019 2:10:09 AM By: Worthy Keeler PA-C Previous Signature: 05/19/2019 5:58:37 PM Version By: Worthy Keeler PA-C Entered By: Worthy Keeler on 05/21/2019 02:09:17 SELENIA, NOONE ([id number]) -------------------------------------------------------------------------------- Physical Exam Details Patient Name: [name], [name] Date of Service: 05/19/2019 1:00 PM Medical Record Number: [id number] Patient Account Number: [id number] Date of Birth/Sex: [birth year] (85 y.o. F) Treating RN: Montey Hora Primary Care Provider: Park Liter Other Clinician: Referring Provider: Park Liter Treating Provider/Extender: [name], [name] Weeks in Treatment: 82 Constitutional Well-nourished and well-hydrated in no acute distress. Respiratory normal breathing without difficulty. clear to auscultation bilaterally. Cardiovascular regular rate and rhythm with normal S1, S2. Psychiatric this patient is able to make decisions and demonstrates good insight into disease process. Alert and Oriented x 3. pleasant and cooperative. Notes Upon inspection today patient's wound bed showed signs of excellent granulation at this time. And again I did not have to perform any sharp debridement today which is also good news. Overall she seems to be doing well except for the dressing was somewhat stuck to the wound bed. For that reason I would recommend that we go ahead and apply some  triple antibiotic ointment to the wound bed then double layer Xeroform gauze to help prevent it from drying out I think she should do very well with this. Electronic Signature(s) Signed: 05/19/2019 5:59:24 PM By: Worthy Keeler PA-C Entered By: Worthy Keeler on 05/19/2019 17:59:24 FELISITA, PANTAZIS ([id number]) -------------------------------------------------------------------------------- Physician Orders Details Patient Name: [name]. Date of Service: 05/19/2019 1:00 PM Medical Record Number: [id number] Patient Account Number: [id number] Date of Birth/Sex: 1928-08-28 (85 y.o. F) Treating RN: Montey Hora Primary Care Provider: Park Liter Other Clinician: Referring Provider: Park Liter Treating Provider/Extender: [name], [name] Weeks in Treatment: 10 Verbal / Phone Orders: No Diagnosis Coding ICD-10 Coding Code Description I87.2 Venous insufficiency (chronic) (peripheral) S51.001A Unspecified open wound of right elbow, initial encounter Wound Cleansing Wound #16 Right Elbow o Clean wound with Normal Saline. Primary Wound Dressing Wound #16 Right Elbow o Xeroform - double layer over antibiotic ointment o Other: -  antibiotic ointment Secondary Dressing Wound #16 Right Elbow o Dry Gauze o Conform/Kerlix o Other - stetch net Dressing Change Frequency Wound #16 Right Elbow o Change dressing every other day. Follow-up Appointments Wound #16 Right Elbow o Return Appointment in 1 week. Electronic Signature(s) Signed: 05/19/2019 3:57:57 PM By: Montey Hora Signed: 05/21/2019 2:05:35 AM By: Worthy Keeler PA-C Entered By: Montey Hora on 05/19/2019 14:00:52 Gindlesperger, Khristy HMarland Kitchen ([id number]) -------------------------------------------------------------------------------- Problem List Details Patient Name: [name], [name] Date of Service: 05/19/2019 1:00 PM Medical Record Number: [id number] Patient Account Number: [id number] Date  of Birth/Sex: 07-28-1928 (85 y.o. F) Treating RN: Montey Hora Primary Care Provider: Park Liter Other Clinician: Referring Provider: Park Liter Treating Provider/Extender: [name], [name] Weeks in Treatment: 10 Active Problems ICD-10 Evaluated Encounter Code Description Active Date Today Diagnosis I87.2 Venous insufficiency (chronic) (peripheral) 03/10/2019 No Yes S51.001A Unspecified open wound of right elbow, initial encounter 04/28/2019 No Yes Inactive Problems Resolved Problems ICD-10 Code Description Active Date Resolved Date L97.812 Non-pressure chronic ulcer of other part of right lower leg with fat 03/10/2019 03/10/2019 layer exposed Electronic Signature(s) Signed: 05/19/2019 1:54:47 PM By: Worthy Keeler PA-C Entered By: Worthy Keeler on 05/19/2019 13:54:47 Giannelli, Aubrei [name] -------------------------------------------------------------------------------- Progress Note Details Patient Name: Folse, Haivyn H. Date of Service: 05/19/2019 1:00 PM Medical Record Number: [id number] Patient Account Number: [id number] Date of Birth/Sex: 10/22/1927 (85 y.o. F) Treating RN: Montey Hora Primary Care Provider: Park Liter Other Clinician: Referring Provider: Park Liter Treating Provider/Extender: [name], [name] Weeks in Treatment: 10 Subjective Chief Complaint Information obtained from Patient Right elbow skin tear History of Present Illness (HPI) 02/12/18 ADMISSION This is an 85 year old woman who is recently moved to [place] from [place]. Her story began in February where she fell on some ice suffering extensive lacerations to her bilateral lower extremities. She is able to show me extensive pictures of the right lower leg but with going to a wound care center and [place] 3 times a week these eventually closed over. She has been left with 1 area on the upper lateral left calf. She has been applying Neosporin to this and applying a  bandage. Currently this measures 2 x 1.5 cm. The patient is not a diabetic. She is an ex-smoker quitting 40 years ago. She does have COPD by description. ABIs in our clinic were 1.32 on the right and 1.03 on the left 02/19/18; right lower leg wound which was initially trauma. The area that we look that last week is smaller. Still covered in a nonviable surface however. We are using Iodoflex 02/26/18; right lower leg wound which was initially trauma in the setting of chronic venous insufficiency. Surface of the wound looks much better healthy granulation advancing epithelialization. We have good edema control 03/05/18; right lower leg wound which was initially trauma in the setting of chronic venous insufficiency. She continues to make nice progress here. We have good edema control He arrives today with a new traumatic laceration on her right dorsal forearm which she states happened while she was putting on a sweatshirt 03/12/18; right lower leg wound as closed however it still looks vulnerable. She has chronic venous insufficiency. The new wound from last week a traumatic area on her right dorsal or arm unfortunately does not have a viable surface. I remove some nonviable skin and necrotic subcutaneous debris from the wound surface. Hemostasis with silver nitrate and direct pressure 03/19/18; right lower leg wound is closed. She has chronic venous insufficiency and will need ongoing  compression The new wounds from 2 weeks ago on the right upper elbow is closed. The area on her right dorsal forearm continues to have a nonviable surface requiring debridement 03/26/18; we'll need to look into ongoing compression for her legs. 2 wounds on the right upper elbow is closed. The area on the right dorsal forearm looks better. Hydrofera Blue secondary to hypertrophic granulation 04/02/18; he has compression stockings although she did not wear them today. Severe chronic venous insufficiency. She has 2 wounds on  the right upper elbow which I said were closed last week which actually are although they're very small and she has a smaller clean wound on the right dorsal forearm. We've been using Hydrofera Blue secondary to some upper granulation. All of this looks better 04/09/18-She is seen in follow up evaluation for a right elbow and right forearm skin tear. The right elbow is healed. We will continue with same treatment plan and she'll follow next week 04/16/18-She is seen in follow-up evaluation for right forearm skin tear, this is essentially healed. We will cover with foam border and she will follow-up next week 04/23/18; the patient arrives with a new skin tear on her right dorsal arm just below the elbow. She got this while carrying a box. She has a linear skin tear. There is no depth I don't think this should've been sutured. There is a skin flap medially I'm not sure if this will be viable or not 04/30/18; the new skin tear from last week unfortunately has remained viable in terms of the skin flap. She has a small open area that looks healthy. Using moistened silver collagen 05/07/18; right dorsal arm skin tear. Nonviable tissue over the remnants of the wound. We've been using silver collagen Carol, Wise ([id number]) 05/21/18; right dorsal arm skin tear. Nonviable tissue over a small remnant of the wound was washed off. We've been using silver collagen which we will continue 06/04/18; the right dorsal arm skin tear has healed. She arrives today with a traumatic wound just above the olecranon of her left elbow. This is a skin tear with a nonviable nonadherent flap which was removed. We'll use silver collagen here. Also noteworthy she arrives without her compression stockings 06/18/18; the area just above the olecranon of her left elbow. It is smaller and generally has a healthy surface. I was surprised to learn that the patient is actually changing this herself although. Appears this week she was  putting some topical lidocaine on this that she bought over-the-counter at the drugstore. She did get supplies at home she didn't know how to put it on. She drove herself here today i.e. not eligible for home health Readmission: 11/04/18 on evaluation today patient presents for evaluation our clinic news to include the she has on her lower extremity which occurred as the result of having hit this on a piece of furniture. This was back in December. She states that her daughter has been trying to get her to come into have this evaluated here the wound care center but she was being somewhat stubborn. Nonetheless upon evaluation today the wound really appears to be potentially healed she does have some swelling of lower extremity which I think will come into play. With that being said he tells me that even yesterday this was still making clear fluid and therefore there may be some issues with continued problems with the swelling and drainage secondary to her venous stasis. Nonetheless overall wound appears to be doing fairly well which  is good news. 11/11/18 on evaluation today patient actually appears to be doing excellent in the area of question that was green just the day before he saw her last time actually appears to have not drain that always in past week. I think this is done very well and at this point though she's had some discomfort I do not see any evidence of open wound which he needs to continue see wound care. Readmission: 03/10/19 on evaluation today patient presents for reevaluation here in our clinic concerning issues that she actually is having with the new problem on her right lower leg due to trauma that occurred when a car door shut on her leg last Thursday. This is not been quite a week and she does have several skin tears unfortunately that resulted from this injury. Fortunately there's no signs of active infection at this time. No fevers, chills, nausea, or vomiting noted at this  time. She does have some hot tissue noted at this point that has me concerned about the possibility of needing to move this in order to allow the areas to heal appropriately. Fortunately there's no signs of active infection at this time. 03/17/19-Patient returns to clinic at 1 week, we have been using Prisma on the larger wounds with Kerlix and Xeroform. As many as 5 wounds were number last time 3 of which have closed, leaving the right proximal tibial and right distal medial leg wound both of which are looking good 03/24/19 on evaluation today patient appears to be doing better in regard to her wounds at this time. Fortunately there is no sign of active infection at this time. No fevers chills noted. She's been tolerating the dressing changes without complication which is good news she's been performing these herself it sounds like home health is not going to be coming out it sounds as if they feel like she may be able to take care of yourself and not be technically homebound. Nonetheless though that is unfortunate it does seem like she's done fairly well with taking care of yourself from the standpoint of the dressings. 03/31/19 on evaluation today patient actually appears to be doing well with regard to her lower extremity ulcers. She had one area to heal today and in regard to her right lower extremity were she continues to have an open wound I did have to perform some sharp debridement today. Subsequently the patient still has an area of what appears to be possibly a small hematoma on the anterior portion of her left lower extremity. This is not open enough at this time but nonetheless does seem to show some evidence of hardening which is not necessarily a bad thing. Eventually this may pop off but again I'm not interested in really removing this prematurely. 04/07/19 on evaluation today patient actually appears to be doing well with regard to her lower extremity ulcers. She is been tolerating the  dressing changes without complication. She seems to be making excellent progress which is great news. She does have one small new skin tear area but the good news is she was able to pull the skin back over and it really looks like it is healing quite nicely. Overall that is just a very small region and again seems to be healing nicely. 04/14/19 upon evaluation today patient actually appears to be doing quite well with regard to the wounds on the right lower extremity. In fact everything appears to be healed as of today. This is excellent news. She does however  have the area on the left lower extremity which is showing some signs of loosening up we have been watching this area it is no longer fluctuant and in fact with the Betadine on it it is been slowly dry now but is now lifting up on the edge. There may be a wound underneath [name], [name] this region. 04/21/2019 on evaluation today patient appears to be doing excellent in regard to her bilateral lower extremities. In fact she has been tolerating the dressing changes without complication does not seem to show any signs of infection at this point. In fact based on what I am seeing currently I think she is completely healed. 05/04/2019 on evaluation today patient actually appears to be doing quite well with regard to her right elbow. She has been tolerating the dressing changes without complication. Fortunately there is no signs of active infection at this time. Overall I am very pleased with how things are progressing. The elbow almost appears to be completely healed though it is not 100% today. 05/14/2019 on evaluation today patient actually appears to be doing well with regard to her elbow skin tear that we have been treating in fact this appears to be completely healed. Unfortunately she has a new skin tear noted as of today which actually occurred in the last week since I previously saw her. This again is unfortunate that it has  been so long because I am not going to be able to actually pull the skin over where you have to trim this away and let it heal by second intent. This new wound is in the right elbow region as well but again separate from the original ulcer. 05/19/2019 on evaluation today patient appears to be doing well with regard to her skin tear on her elbow. She has been tolerating the dressing changes without complication. Fortunately there is no signs of active infection at this time. No fever chills noted. Overall I am very pleased with how things appear today. She is not having any significant pain which is also excellent news. Patient History Information obtained from Patient. Family History Cancer - Siblings,Paternal Grandparents, Diabetes - Siblings, Heart Disease - Mother,Father,Child,Paternal Grandparents, Hypertension - Child, No family history of Hereditary Spherocytosis, Kidney Disease, Lung Disease, Seizures, Stroke, Thyroid Problems, Tuberculosis. Social History Former smoker - quit 40 yrs ago, Marital Status - Divorced, Alcohol Use - Never, Drug Use - No History, Caffeine Use - Rarely. Medical History Eyes Patient has history of Cataracts - surgery 2000, Glaucoma Respiratory Patient has history of Asthma, Chronic Obstructive Pulmonary Disease (COPD) Denies history of Pneumothorax, Sleep Apnea, Tuberculosis Cardiovascular Patient has history of Arrhythmia - a-fib, Hypertension Denies history of Angina, Congestive Heart Failure, Coronary Artery Disease, Deep Vein Thrombosis, Hypotension, Myocardial Infarction, Peripheral Arterial Disease, Peripheral Venous Disease, Phlebitis, Vasculitis Endocrine Denies history of Type I Diabetes, Type II Diabetes Genitourinary Denies history of End Stage Renal Disease Immunological Denies history of Lupus Erythematosus, Raynaud s, Scleroderma Integumentary (Skin) Denies history of History of Burn, History of pressure wounds Musculoskeletal Denies  history of Gout, Rheumatoid Arthritis, Osteoarthritis, Osteomyelitis Neurologic Denies history of Dementia, Neuropathy, Quadriplegia, Paraplegia, Seizure Disorder Oncologic Denies history of Received Chemotherapy, Received Radiation LORRINA, BHAVSAR ([id number]) Psychiatric Denies history of Anorexia/bulimia, Confinement Anxiety Hospitalization/Surgery History - Colon cancer. Review of Systems (ROS) Constitutional Symptoms (General Health) Denies complaints or symptoms of Fatigue, Fever, Chills, Marked Weight Change. Respiratory Denies complaints or symptoms of Chronic or frequent coughs, Shortness of Breath. Cardiovascular Denies complaints or  symptoms of Chest pain, LE edema. Psychiatric Denies complaints or symptoms of Anxiety, Claustrophobia. Objective Constitutional Well-nourished and well-hydrated in no acute distress. Vitals Time Taken: 1:44 PM, Height: 60 in, Weight: 92 lbs, BMI: 18, Temperature: 98.7 F, Pulse: 60 bpm, Respiratory Rate: 16 breaths/min, Blood Pressure: 128/60 mmHg. Respiratory normal breathing without difficulty. clear to auscultation bilaterally. Cardiovascular regular rate and rhythm with normal S1, S2. Psychiatric this patient is able to make decisions and demonstrates good insight into disease process. Alert and Oriented x 3. pleasant and cooperative. General Notes: Upon inspection today patient's wound bed showed signs of excellent granulation at this time. And again I did not have to perform any sharp debridement today which is also good news. Overall she seems to be doing well except for the dressing was somewhat stuck to the wound bed. For that reason I would recommend that we go ahead and apply some triple antibiotic ointment to the wound bed then double layer Xeroform gauze to help prevent it from drying out I think she should do very well with this. Integumentary (Hair, Skin) Wound #16 status is Open. Original cause of wound was Trauma. The  wound is located on the Right Elbow. The wound measures 0.9cm length x 1cm width x 0.1cm depth; 0.707cm^2 area and 0.071cm^3 volume. There is Fat Layer (Subcutaneous Tissue) Exposed exposed. There is no tunneling or undermining noted. There is a medium amount of serous drainage noted. The wound margin is flat and intact. There is large (67-100%) red, pink granulation within the wound bed. There is no necrotic tissue within the wound bed. VYOLET, ZEHRING ([id number]) Assessment Active Problems ICD-10 Venous insufficiency (chronic) (peripheral) Unspecified open wound of right elbow, initial encounter Plan Wound Cleansing: Wound #16 Right Elbow: Clean wound with Normal Saline. Primary Wound Dressing: Wound #16 Right Elbow: Xeroform - double layer over antibiotic ointment Other: - antibiotic ointment Secondary Dressing: Wound #16 Right Elbow: Dry Gauze Conform/Kerlix Other - stetch net Dressing Change Frequency: Wound #16 Right Elbow: Change dressing every other day. Follow-up Appointments: Wound #16 Right Elbow: Return Appointment in 1 week. 1. We will continue with the current wound care measures including the Xeroform gauze although we will double layer this as well as apply triple antibiotic ointment underneath to provide extra moisture and prevent this from breaking down. 2. I am also going to suggest currently that we have her continue to change the dressing every other day which I think will be appropriate especially in light of the added protection against the dressing drying out. 3. I do recommend no adhesive on her skin and therefore we will get a continue to utilize the stretch net as well. We will see patient back for reevaluation in 1 week here in the clinic. If anything worsens or changes patient will contact our office for additional recommendations. Electronic Signature(s) Signed: 05/21/2019 2:10:09 AM By: Worthy Keeler PA-C Previous Signature: 05/19/2019  6:01:21 PM Version By: Worthy Keeler PA-C Entered By: Worthy Keeler on 05/21/2019 02:09:47 TRANIYAH, CHRISTINE ([id number]) -------------------------------------------------------------------------------- ROS/PFSH Details Patient Name: Carol Berger H. Date of Service: 05/19/2019 1:00 PM Medical Record Number: [id number] Patient Account Number: [id number] Date of Birth/Sex: [birth year] (85 y.o. F) Treating RN: Montey Hora Primary Care Provider: Park Liter Other Clinician: Referring Provider: Park Liter Treating Provider/Extender: [name], [name] Weeks in Treatment: 10 Information Obtained From Patient Constitutional Symptoms (General Health) Complaints and Symptoms: Negative for: Fatigue; Fever; Chills; Marked Weight Change Respiratory Complaints and Symptoms: Negative for: Chronic or  frequent coughs; Shortness of Breath Medical History: Positive for: Asthma; Chronic Obstructive Pulmonary Disease (COPD) Negative for: Pneumothorax; Sleep Apnea; Tuberculosis Cardiovascular Complaints and Symptoms: Negative for: Chest pain; LE edema Medical History: Positive for: Arrhythmia - a-fib; Hypertension Negative for: Angina; Congestive Heart Failure; Coronary Artery Disease; Deep Vein Thrombosis; Hypotension; Myocardial Infarction; Peripheral Arterial Disease; Peripheral Venous Disease; Phlebitis; Vasculitis Psychiatric Complaints and Symptoms: Negative for: Anxiety; Claustrophobia Medical History: Negative for: Anorexia/bulimia; Confinement Anxiety Eyes Medical History: Positive for: Cataracts - surgery 2000; Glaucoma Endocrine Medical History: Negative for: Type I Diabetes; Type II Diabetes Genitourinary Medical History: Negative for: End Stage Renal Disease Bansal, Tashika H. ([id number]) Immunological Medical History: Negative for: Lupus Erythematosus; Raynaudos; Scleroderma Integumentary (Skin) Medical History: Negative for: History of Burn; History of  pressure wounds Musculoskeletal Medical History: Negative for: Gout; Rheumatoid Arthritis; Osteoarthritis; Osteomyelitis Neurologic Medical History: Negative for: Dementia; Neuropathy; Quadriplegia; Paraplegia; Seizure Disorder Oncologic Medical History: Negative for: Received Chemotherapy; Received Radiation HBO Extended History Items Eyes: Eyes: Cataracts Glaucoma Immunizations Pneumococcal Vaccine: Received Pneumococcal Vaccination: Yes Implantable Devices None Hospitalization / Surgery History Type of Hospitalization/Surgery Colon cancer Family and Social History Cancer: Yes - Siblings,Paternal Grandparents; Diabetes: Yes - Siblings; Heart Disease: Yes - Mother,Father,Child,Paternal Grandparents; Hereditary Spherocytosis: No; Hypertension: Yes - Child; Kidney Disease: No; Lung Disease: No; Seizures: No; Stroke: No; Thyroid Problems: No; Tuberculosis: No; Former smoker - quit 40 yrs ago; Marital Status - Divorced; Alcohol Use: Never; Drug Use: No History; Caffeine Use: Rarely; Financial Concerns: No; Food, Clothing or Shelter Needs: No; Support System Lacking: No; Transportation Concerns: No Physician Affirmation I have reviewed and agree with the above information. Electronic Signature(s) Signed: 05/20/2019 4:36:54 PM By: Montey Hora Signed: 05/21/2019 2:05:35 AM By: Worthy Keeler PA-C Entered By: Worthy Keeler on 05/19/2019 17:59:00 Tokunaga, Takeshia HMarland Kitchen ([id number]) -------------------------------------------------------------------------------- SuperBill Details Patient Name: Carol Berger H. Date of Service: 05/19/2019 Medical Record Number: [id number] Patient Account Number: [id number] Date of Birth/Sex: 1928/08/10 (85 y.o. F) Treating RN: Montey Hora Primary Care Provider: Park Liter Other Clinician: Referring Provider: Park Liter Treating Provider/Extender: [name], [name] Weeks in Treatment: 10 Diagnosis Coding ICD-10 Codes Code  Description I87.2 Venous insufficiency (chronic) (peripheral) S51.001A Unspecified open wound of right elbow, initial encounter Facility Procedures CPT4 Code: [id number] Description: 99213 - WOUND CARE VISIT-LEV 3 EST PT Modifier: Quantity: 1 Physician Procedures CPT4 Code: [id number] Description: [id number] - WC PHYS LEVEL 4 - EST PT ICD-10 Diagnosis Description I87.2 Venous insufficiency (chronic) (peripheral) S51.001A Unspecified open wound of right elbow, initial encounter Modifier: Quantity: 1 Electronic Signature(s) Signed: 05/19/2019 6:01:50 PM By: Worthy Keeler PA-C Entered By: Worthy Keeler on 05/19/2019 18:01:50

## 2019-05-20 ENCOUNTER — Ambulatory Visit: Payer: Self-pay | Admitting: *Deleted

## 2019-05-20 NOTE — Telephone Encounter (Signed)
Pt called stating that she had a black stool this morning, like little balls. She stated she feels constipated. Denies seeing blood or nausea or having abd pain. She is on an iron tablet per her medication list. She is advised to the hospital if she has bright red bleeding, passing clots and abd pain. She voiced understanding. Attempted to notify [facility] to schedule an appointment. Per protocol to be scheduled within 3 days and she is requesting for tomorrow. Routing to the practice for review and to call patient back.  Reason for Disposition . Unable to have a bowel movement (BM) without laxative or enema  Answer Assessment - Initial Assessment Questions 1. STOOL PATTERN OR FREQUENCY: "How often do you pass bowel movements (BMs)?"  (Normal range: tid to q 3 days)  "When was the last BM passed?"       Last BM was today. Normally everyday 2. STRAINING: "Do you have to strain to have a BM?"      yes 3. RECTAL PAIN: "Does your rectum hurt when the stool comes out?" If so, ask: "Do you have hemorrhoids? How bad is the pain?"  (Scale 1-10; or mild, moderate, severe)     No pain and no hemorrhoids 4. STOOL COMPOSITION: "Are the stools hard?"      yes 5. BLOOD ON STOOLS: "Has there been any blood on the toilet tissue or on the surface of the BM?" If so, ask: "When was the last time?"      black 6. CHRONIC CONSTIPATION: "Is this a new problem for you?"  If no, ask: "How long have you had this problem?" (days, weeks, months)      This is new 7. CHANGES IN DIET: "Have there been any recent changes in your diet?"      no 8. MEDICATIONS: "Have you been taking any new medications?"     no 9. LAXATIVES: "Have you been using any laxatives or enemas?"  If yes, ask "What, how often, and when was the last time?"     Took a dose of metamucil this morning 10. CAUSE: "What do you think is causing the constipation?"        Not sure 11. OTHER SYMPTOMS: "Do you have any other symptoms?" (e.g.,  abdominal pain, fever, vomiting)       no 12. PREGNANCY: "Is there any chance you are pregnant?" "When was your last menstrual period?"       n/a  Protocols used: CONSTIPATION-A-AH

## 2019-05-20 NOTE — Progress Notes (Addendum)
CONCHITA, GLOSS ([id number]) Visit Report for 05/19/2019 Arrival Information Details Patient Name: ALAIYNA, MOESSNER. Date of Service: 05/19/2019 1:00 PM Medical Record Number: [id number] Patient Account Number: [id number] Date of Birth/Sex: 02-25-1928 (85 y.o. F) Treating RN: [name] Barman Primary Care Katriona Schmierer: Park Liter Other Clinician: Referring Sincerity Cedar: Park Liter Treating Katalea Ucci/Extender: [name], [name] Weeks in Treatment: 10 Visit Information History Since Last Visit Added or deleted any medications: No Patient Arrived: Ambulatory Any new allergies or adverse reactions: No Arrival Time: 13:43 Had a fall or experienced change in No Accompanied By: self activities of daily living that may affect Transfer Assistance: None risk of falls: Patient Identification Verified: Yes Signs or symptoms of abuse/neglect since last visito No Secondary Verification Process Yes Hospitalized since last visit: No Completed: Has Dressing in Place as Prescribed: Yes Patient Requires Transmission-Based No Pain Present Now: No Precautions: Patient Has Alerts: Yes Patient Alerts: Patient on Blood Thinner 81mg  aspirin Electronic Signature(s) Signed: 05/20/2019 5:22:27 PM By: Gretta Cool, BSN, RN, CWS, Kim RN, BSN Entered By: Gretta Cool, BSN, RN, CWS, Kim on 05/19/2019 13:44:18 Blackstock, [name] ([id number]) -------------------------------------------------------------------------------- Clinic Level of Care Assessment Details Patient Name: Power, Tene H. Date of Service: 05/19/2019 1:00 PM Medical Record Number: [id number] Patient Account Number: [id number] Date of Birth/Sex: 08-Jul-1928 (85 y.o. F) Treating RN: Montey Hora Primary Care Breven Guidroz: Park Liter Other Clinician: Referring Dorraine Ellender: Park Liter Treating Donnie Panik/Extender: [name], [name] Weeks in Treatment: 10 Clinic Level of Care Assessment Items TOOL 4 Quantity Score []  - Use when only an EandM is  performed on FOLLOW-UP visit 0 ASSESSMENTS - Nursing Assessment / Reassessment X - Reassessment of Co-morbidities (includes updates in patient status) 1 10 X- 1 5 Reassessment of Adherence to Treatment Plan ASSESSMENTS - Wound and Skin Assessment / Reassessment X - Simple Wound Assessment / Reassessment - one wound 1 5 []  - 0 Complex Wound Assessment / Reassessment - multiple wounds []  - 0 Dermatologic / Skin Assessment (not related to wound area) ASSESSMENTS - Focused Assessment []  - Circumferential Edema Measurements - multi extremities 0 []  - 0 Nutritional Assessment / Counseling / Intervention []  - 0 Lower Extremity Assessment (monofilament, tuning fork, pulses) []  - 0 Peripheral Arterial Disease Assessment (using hand held doppler) ASSESSMENTS - Ostomy and/or Continence Assessment and Care []  - Incontinence Assessment and Management 0 []  - 0 Ostomy Care Assessment and Management (repouching, etc.) PROCESS - Coordination of Care X - Simple Patient / Family Education for ongoing care 1 15 []  - 0 Complex (extensive) Patient / Family Education for ongoing care X- 1 10 Staff obtains Programmer, systems, Records, Test Results / Process Orders []  - 0 Staff telephones HHA, Nursing Homes / Clarify orders / etc []  - 0 Routine Transfer to another Facility (non-emergent condition) []  - 0 Routine Hospital Admission (non-emergent condition) []  - 0 New Admissions / Biomedical engineer / Ordering NPWT, Apligraf, etc. []  - 0 Emergency Hospital Admission (emergent condition) X- 1 10 Simple Discharge Coordination Gitlin, Michella H. ([id number]) []  - 0 Complex (extensive) Discharge Coordination PROCESS - Special Needs []  - Pediatric / Minor Patient Management 0 []  - 0 Isolation Patient Management []  - 0 Hearing / Language / Visual special needs []  - 0 Assessment of Community assistance (transportation, D/C planning, etc.) []  - 0 Additional assistance / Altered mentation []  -  0 Support Surface(s) Assessment (bed, cushion, seat, etc.) INTERVENTIONS - Wound Cleansing / Measurement X - Simple Wound Cleansing - one wound 1 5 []  - 0 Complex Wound Cleansing - multiple  wounds X- 1 5 Wound Imaging (photographs - any number of wounds) []  - 0 Wound Tracing (instead of photographs) X- 1 5 Simple Wound Measurement - one wound []  - 0 Complex Wound Measurement - multiple wounds INTERVENTIONS - Wound Dressings X - Small Wound Dressing one or multiple wounds 1 10 []  - 0 Medium Wound Dressing one or multiple wounds []  - 0 Large Wound Dressing one or multiple wounds []  - 0 Application of Medications - topical []  - 0 Application of Medications - injection INTERVENTIONS - Miscellaneous []  - External ear exam 0 []  - 0 Specimen Collection (cultures, biopsies, blood, body fluids, etc.) []  - 0 Specimen(s) / Culture(s) sent or taken to Lab for analysis []  - 0 Patient Transfer (multiple staff / Civil Service fast streamer / Similar devices) []  - 0 Simple Staple / Suture removal (25 or less) []  - 0 Complex Staple / Suture removal (26 or more) []  - 0 Hypo / Hyperglycemic Management (close monitor of Blood Glucose) []  - 0 Ankle / Brachial Index (ABI) - do not check if billed separately X- 1 5 Vital Signs Ferraris, Kearia H. ([id number]) Has the patient been seen at the hospital within the last three years: Yes Total Score: 85 Level Of Care: New/Established - Level 3 Electronic Signature(s) Signed: 05/19/2019 3:57:57 PM By: Montey Hora Entered By: Montey Hora on 05/19/2019 14:48:16 Enderle, Lacheryl Lemmie Evens ([id number]) -------------------------------------------------------------------------------- Encounter Discharge Information Details Patient Name: [name] Date of Service: 05/19/2019 1:00 PM Medical Record Number: [id number] Patient Account Number: [id number] Date of Birth/Sex: 10-09-1927 (85 y.o. F) Treating RN: Montey Hora Primary Care Swanson Farnell: Park Liter Other Clinician: Referring Johari Pinney: Park Liter Treating Jeret Goyer/Extender: [name], [name] Weeks in Treatment: 10 Encounter Discharge Information Items Discharge Condition: Stable Ambulatory Status: Ambulatory Discharge Destination: Home Transportation: Private Auto Accompanied By: self Schedule Follow-up Appointment: Yes Clinical Summary of Care: Electronic Signature(s) Signed: 05/19/2019 3:57:57 PM By: Montey Hora Entered By: Montey Hora on 05/19/2019 14:09:58 Jividen, [name] ([id number]) -------------------------------------------------------------------------------- Lower Extremity Assessment Details Patient Name: Trombly, Earnesteen H. Date of Service: 05/19/2019 1:00 PM Medical Record Number: [id number] Patient Account Number: [id number] Date of Birth/Sex: 02-19-1928 (85 y.o. F) Treating RN: [name] Barman Primary Care Davanta Meuser: Park Liter Other Clinician: Referring Kruze Atchley: Park Liter Treating Blessyn Sommerville/Extender: Worthy Keeler Weeks in Treatment: 10 Electronic Signature(s) Signed: 05/20/2019 5:22:27 PM By: Gretta Cool, BSN, RN, CWS, Kim RN, BSN Entered By: Gretta Cool, BSN, RN, CWS, Kim on 05/19/2019 13:51:52 ELKE, OPARA ([id number]) -------------------------------------------------------------------------------- Multi Wound Chart Details Patient Name: Suitt, Lakhia H. Date of Service: 05/19/2019 1:00 PM Medical Record Number: [id number] Patient Account Number: [id number] Date of Birth/Sex: 1928-01-06 (85 y.o. F) Treating RN: Montey Hora Primary Care Tyan Lasure: Park Liter Other Clinician: Referring Obadiah Dennard: Park Liter Treating Marcena Dias/Extender: [name], [name] Weeks in Treatment: 10 Vital Signs Height(in): 60 Pulse(bpm): 60 Weight(lbs): 81 Blood Pressure(mmHg): 128/60 Body Mass Index(BMI): 18 Temperature(F): 98.7 Respiratory Rate 16 (breaths/min): Photos: [N/A:N/A] Wound Location: Right Elbow N/A N/A Wounding Event: Trauma  N/A N/A Primary Etiology: Skin Tear N/A N/A Comorbid History: Cataracts, Glaucoma, Asthma, N/A N/A Chronic Obstructive Pulmonary Disease (COPD), Arrhythmia, Hypertension Date Acquired: 05/11/2019 N/A N/A Weeks of Treatment: 0 N/A N/A Wound Status: Open N/A N/A Measurements L x W x D 0.9x1x0.1 N/A N/A (cm) Area (cm) : 0.707 N/A N/A Volume (cm) : 0.071 N/A N/A % Reduction in Area: 57.10% N/A N/A % Reduction in Volume: 57.00% N/A N/A Classification: Full Thickness Without N/A N/A Exposed Support Structures Exudate Amount: Medium N/A N/A Exudate  Type: Serous N/A N/A Exudate Color: amber N/A N/A Wound Margin: Flat and Intact N/A N/A Granulation Amount: Large (67-100%) N/A N/A Granulation Quality: Red, Pink N/A N/A Necrotic Amount: None Present (0%) N/A N/A Exposed Structures: Fat Layer (Subcutaneous N/A N/A Tissue) Exposed: Yes Fascia: No Tendon: No Tesar, Tiandra H. ([id number]) Muscle: No Joint: No Bone: No Epithelialization: Small (1-33%) N/A N/A Treatment Notes Electronic Signature(s) Signed: 05/19/2019 3:57:57 PM By: Montey Hora Entered By: Montey Hora on 05/19/2019 13:59:58 Low, [name] ([id number]) -------------------------------------------------------------------------------- Multi-Disciplinary Care Plan Details Patient Name: [name] Date of Service: 05/19/2019 1:00 PM Medical Record Number: [id number] Patient Account Number: [id number] Date of Birth/Sex: 1928/03/18 (85 y.o. F) Treating RN: Montey Hora Primary Care Kayceon Oki: Park Liter Other Clinician: Referring Jacoby Zanni: Park Liter Treating Adaley Kiene/Extender: [name], [name] Weeks in Treatment: 10 Active Inactive Abuse / Safety / Falls / Self Care Management Nursing Diagnoses: Potential for falls Goals: Patient will not experience any injury related to falls Date Initiated: 03/10/2019 Target Resolution Date: 06/13/2019 Goal Status: Active Interventions: Assess fall risk  on admission and as needed Notes: Orientation to the Wound Care Program Nursing Diagnoses: Knowledge deficit related to the wound healing center program Goals: Patient/caregiver will verbalize understanding of the [place] Program Date Initiated: 03/10/2019 Target Resolution Date: 06/13/2019 Goal Status: Active Interventions: Provide education on orientation to the wound center Notes: Wound/Skin Impairment Nursing Diagnoses: Impaired tissue integrity Goals: Ulcer/skin breakdown will heal within 14 weeks Date Initiated: 03/10/2019 Target Resolution Date: 06/13/2019 Goal Status: Active Interventions: Assess patient/caregiver ability to obtain necessary supplies [place]ent[place], Mariapaula H. ([id number]) Assess patient/caregiver ability to perform ulcer/skin care regimen upon admission and as needed Assess ulceration(s) every visit Notes: Electronic Signature(s) Signed: 05/19/2019 3:57:57 PM By: Montey Hora Entered By: Montey Hora on 05/19/2019 13:59:13 Weinmann, Wania H. ([id number]) -------------------------------------------------------------------------------- Pain Assessment Details Patient Name: [name] Date of Service: 05/19/2019 1:00 PM Medical Record Number: [id number] Patient Account Number: [id number] Date of Birth/Sex: November 29, 1927 (85 y.o. F) Treating RN: [name] Barman Primary Care Emilio Baylock: Park Liter Other Clinician: Referring Breckon Reeves: Park Liter Treating Daneisha Surges/Extender: [name], [name] Weeks in Treatment: 10 Active Problems Location of Pain Severity and Description of Pain Patient Has Paino No Site Locations Pain Management and Medication Current Pain Management: Notes Patient denies pain at this time. States it was throbbing last night. Electronic Signature(s) Signed: 05/20/2019 5:22:27 PM By: Gretta Cool, BSN, RN, CWS, Kim RN, BSN Entered By: Gretta Cool, BSN, RN, CWS, Kim on 05/19/2019 13:44:44 KESHAYLA, SCURRY  ([id number]) -------------------------------------------------------------------------------- Wound Assessment Details Patient Name: Hopf, Roxanne H. Date of Service: 05/19/2019 1:00 PM Medical Record Number: [id number] Patient Account Number: [id number] Date of Birth/Sex: 1928-01-06 (85 y.o. F) Treating RN: [name] Barman Primary Care Eon Zunker: Park Liter Other Clinician: Referring Tieshia Rettinger: Park Liter Treating Bristal Steffy/Extender: [name], [name] Weeks in Treatment: 10 Wound Status Wound Number: 16 Primary Skin Tear Etiology: Wound Location: Right Elbow Wound Open Wounding Event: Trauma Status: Date Acquired: 05/11/2019 Comorbid Cataracts, Glaucoma, Asthma, Chronic Weeks Of Treatment: 0 History: Obstructive Pulmonary Disease (COPD), Clustered Wound: No Arrhythmia, Hypertension Photos Wound Measurements Length: (cm) 0.9 Width: (cm) 1 Depth: (cm) 0.1 Area: (cm) 0.707 Volume: (cm) 0.071 % Reduction in Area: 57.1% % Reduction in Volume: 57% Epithelialization: Small (1-33%) Tunneling: No Undermining: No Wound Description Full Thickness Without Exposed Support Foul Odo Classification: Structures Slough/F Wound Margin: Flat and Intact Exudate Medium Amount: Exudate Type: Serous Exudate Color: amber r After Cleansing: No ibrino Yes Wound Bed Granulation Amount: Large (67-100%) Exposed Structure  Granulation Quality: Red, Pink Fascia Exposed: No Necrotic Amount: None Present (0%) Fat Layer (Subcutaneous Tissue) Exposed: Yes Tendon Exposed: No Muscle Exposed: No Joint Exposed: No Bone Exposed: No Urbanek, Aarika H. ([id number]) Treatment Notes Wound #16 (Right Elbow) Notes ABX ointment, xeroform, rolled gauze, tape, stretchy Electronic Signature(s) Signed: 05/20/2019 5:22:27 PM By: Gretta Cool, BSN, RN, CWS, Kim RN, BSN Entered By: Gretta Cool, BSN, RN, CWS, Kim on 05/19/2019 13:51:38 Stopher, [name]  ([id number]) -------------------------------------------------------------------------------- Vitals Details Patient Name: [name] Date of Service: 05/19/2019 1:00 PM Medical Record Number: [id number] Patient Account Number: [id number] Date of Birth/Sex: 09/16/1927 (85 y.o. F) Treating RN: [name] Barman Primary Care Nikan Ellingson: Park Liter Other Clinician: Referring Destynie Toomey: Park Liter Treating Jeanenne Licea/Extender: [name], [name] Weeks in Treatment: 10 Vital Signs Time Taken: 13:44 Temperature (F): 98.7 Height (in): 60 Pulse (bpm): 60 Weight (lbs): 92 Respiratory Rate (breaths/min): 16 Body Mass Index (BMI): 18 Blood Pressure (mmHg): 128/60 Reference Range: 80 - 120 mg / dl Electronic Signature(s) Signed: 05/20/2019 5:22:27 PM By: Gretta Cool, BSN, RN, CWS, Kim RN, BSN Entered By: Gretta Cool, BSN, RN, CWS, Kim on 05/19/2019 13:46:33

## 2019-05-21 ENCOUNTER — Telehealth: Payer: Self-pay | Admitting: Family Medicine

## 2019-05-21 NOTE — Chronic Care Management (AMB) (Signed)
Chronic Care Management   Note  05/21/2019 Name: [name] MRN: [id number] DOB: 01-20-1928  [name] is a 85 y.o. year old female who is a primary care patient of Valerie Roys, DO. I reached out to [name] by phone today in response to a referral sent by [name]'s health plan.    [name] was given information about Chronic Care Management services today including:  1. CCM service includes personalized support from designated clinical staff supervised by her physician, including individualized plan of care and coordination with other care providers 2. 24/7 contact phone numbers for assistance for urgent and routine care needs. 3. Service will only be billed when office clinical staff spend 20 minutes or more in a month to coordinate care. 4. Only one practitioner may furnish and bill the service in a calendar month. 5. The patient may stop CCM services at any time (effective at the end of the month) by phone call to the office staff. 6. The patient will be responsible for cost sharing (co-pay) of up to 20% of the service fee (after annual deductible is met).  Patient agreed to services and verbal consent obtained.   Follow up plan: Telephone appointment with CCM team member scheduled for: 06/24/2019  [place]  ??[email or web address]   ??[phone]

## 2019-05-22 NOTE — Telephone Encounter (Signed)
Please make sure that she follows up with Dr. Vira Agar at [facility]

## 2019-05-22 NOTE — Telephone Encounter (Signed)
Needs appointment

## 2019-05-22 NOTE — Telephone Encounter (Signed)
Patient notified

## 2019-05-22 NOTE — Telephone Encounter (Signed)
Called pt to schedule appt, she states that she is 100% better and does not want to schedule at this time.

## 2019-05-25 DIAGNOSIS — H401131 Primary open-angle glaucoma, bilateral, mild stage: Secondary | ICD-10-CM | POA: Diagnosis not present

## 2019-05-26 ENCOUNTER — Encounter: Payer: Medicare Other | Admitting: Physician Assistant

## 2019-05-26 ENCOUNTER — Other Ambulatory Visit: Payer: Self-pay

## 2019-05-26 DIAGNOSIS — L97812 Non-pressure chronic ulcer of other part of right lower leg with fat layer exposed: Secondary | ICD-10-CM | POA: Diagnosis not present

## 2019-05-26 DIAGNOSIS — S51811A Laceration without foreign body of right forearm, initial encounter: Secondary | ICD-10-CM | POA: Diagnosis not present

## 2019-05-26 DIAGNOSIS — I872 Venous insufficiency (chronic) (peripheral): Secondary | ICD-10-CM | POA: Diagnosis not present

## 2019-05-26 DIAGNOSIS — S51011A Laceration without foreign body of right elbow, initial encounter: Secondary | ICD-10-CM | POA: Diagnosis not present

## 2019-05-26 DIAGNOSIS — I10 Essential (primary) hypertension: Secondary | ICD-10-CM | POA: Diagnosis not present

## 2019-05-26 DIAGNOSIS — L98492 Non-pressure chronic ulcer of skin of other sites with fat layer exposed: Secondary | ICD-10-CM | POA: Diagnosis not present

## 2019-05-26 DIAGNOSIS — I4891 Unspecified atrial fibrillation: Secondary | ICD-10-CM | POA: Diagnosis not present

## 2019-05-26 DIAGNOSIS — J449 Chronic obstructive pulmonary disease, unspecified: Secondary | ICD-10-CM | POA: Diagnosis not present

## 2019-05-26 DIAGNOSIS — Z87891 Personal history of nicotine dependence: Secondary | ICD-10-CM | POA: Diagnosis not present

## 2019-05-26 DIAGNOSIS — S81802A Unspecified open wound, left lower leg, initial encounter: Secondary | ICD-10-CM | POA: Diagnosis not present

## 2019-05-26 NOTE — Progress Notes (Addendum)
MASIYAH, HILPERT ([id number]) Visit Report for 05/26/2019 Arrival Information Details Patient Name: [name], [name]. Date of Service: 05/26/2019 1:00 PM Medical Record Number: [id number] Patient Account Number: [id number] Date of Birth/Sex: [birth year] (85 y.o. F) Treating RN: Harold Barban Primary Care Aariv Medlock: Park Liter Other Clinician: Referring Lucendia Leard: Park Liter Treating Damany Eastman/Extender: [name], [name] Weeks in Treatment: 11 Visit Information History Since Last Visit Added or deleted any medications: No Patient Arrived: Ambulatory Any new allergies or adverse reactions: No Arrival Time: 13:06 Had a fall or experienced change in No Accompanied By: self activities of daily living that may affect Transfer Assistance: None risk of falls: Patient Identification Verified: Yes Signs or symptoms of abuse/neglect since last visito No Secondary Verification Process Yes Hospitalized since last visit: No Completed: Has Dressing in Place as Prescribed: Yes Patient Requires Transmission-Based No Pain Present Now: No Precautions: Patient Has Alerts: Yes Patient Alerts: Patient on Blood Thinner 81mg  aspirin Electronic Signature(s) Signed: 05/26/2019 3:57:29 PM By: Harold Barban Entered By: Harold Barban on 05/26/2019 13:07:10 Veasey, Nettye Lemmie Evens ([id number]) -------------------------------------------------------------------------------- Clinic Level of Care Assessment Details Patient Name: [name], [name]. Date of Service: 05/26/2019 1:00 PM Medical Record Number: [id number] Patient Account Number: [id number] Date of Birth/Sex: [birth year] (85 y.o. F) Treating RN: Montey Hora Primary Care Daysy Santini: Park Liter Other Clinician: Referring Ashelyn Mccravy: Park Liter Treating Gyneth Hubka/Extender: [name], [name] Weeks in Treatment: 11 Clinic Level of Care Assessment Items TOOL 4 Quantity Score []  - Use when only an EandM is performed on FOLLOW-UP visit  0 ASSESSMENTS - Nursing Assessment / Reassessment X - Reassessment of Co-morbidities (includes updates in patient status) 1 10 X- 1 5 Reassessment of Adherence to Treatment Plan ASSESSMENTS - Wound and Skin Assessment / Reassessment X - Simple Wound Assessment / Reassessment - one wound 1 5 []  - 0 Complex Wound Assessment / Reassessment - multiple wounds []  - 0 Dermatologic / Skin Assessment (not related to wound area) ASSESSMENTS - Focused Assessment []  - Circumferential Edema Measurements - multi extremities 0 []  - 0 Nutritional Assessment / Counseling / Intervention []  - 0 Lower Extremity Assessment (monofilament, tuning fork, pulses) []  - 0 Peripheral Arterial Disease Assessment (using hand held doppler) ASSESSMENTS - Ostomy and/or Continence Assessment and Care []  - Incontinence Assessment and Management 0 []  - 0 Ostomy Care Assessment and Management (repouching, etc.) PROCESS - Coordination of Care X - Simple Patient / Family Education for ongoing care 1 15 []  - 0 Complex (extensive) Patient / Family Education for ongoing care X- 1 10 Staff obtains Programmer, systems, Records, Test Results / Process Orders []  - 0 Staff telephones HHA, Nursing Homes / Clarify orders / etc []  - 0 Routine Transfer to another Facility (non-emergent condition) []  - 0 Routine Hospital Admission (non-emergent condition) []  - 0 New Admissions / Biomedical engineer / Ordering NPWT, Apligraf, etc. []  - 0 Emergency Hospital Admission (emergent condition) X- 1 10 Simple Discharge Coordination Gu, Arabell H. ([id number]) []  - 0 Complex (extensive) Discharge Coordination PROCESS - Special Needs []  - Pediatric / Minor Patient Management 0 []  - 0 Isolation Patient Management []  - 0 Hearing / Language / Visual special needs []  - 0 Assessment of Community assistance (transportation, D/C planning, etc.) []  - 0 Additional assistance / Altered mentation []  - 0 Support Surface(s) Assessment  (bed, cushion, seat, etc.) INTERVENTIONS - Wound Cleansing / Measurement X - Simple Wound Cleansing - one wound 1 5 []  - 0 Complex Wound Cleansing - multiple wounds X- 1 5 Wound Imaging (photographs -  any number of wounds) []  - 0 Wound Tracing (instead of photographs) X- 1 5 Simple Wound Measurement - one wound []  - 0 Complex Wound Measurement - multiple wounds INTERVENTIONS - Wound Dressings X - Small Wound Dressing one or multiple wounds 1 10 []  - 0 Medium Wound Dressing one or multiple wounds []  - 0 Large Wound Dressing one or multiple wounds X- 1 5 Application of Medications - topical []  - 0 Application of Medications - injection INTERVENTIONS - Miscellaneous []  - External ear exam 0 []  - 0 Specimen Collection (cultures, biopsies, blood, body fluids, etc.) []  - 0 Specimen(s) / Culture(s) sent or taken to Lab for analysis []  - 0 Patient Transfer (multiple staff / Harrel Lemon Lift / Similar devices) []  - 0 Simple Staple / Suture removal (25 or less) []  - 0 Complex Staple / Suture removal (26 or more) []  - 0 Hypo / Hyperglycemic Management (close monitor of Blood Glucose) []  - 0 Ankle / Brachial Index (ABI) - do not check if billed separately X- 1 5 Vital Signs Sobh, Dalina H. ([id number]) Has the patient been seen at the hospital within the last three years: Yes Total Score: 90 Level Of Care: New/Established - Level 3 Electronic Signature(s) Signed: 05/26/2019 4:20:48 PM By: Montey Hora Entered By: Montey Hora on 05/26/2019 13:41:50 Kichline, Anagabriela Lemmie Evens ([id number]) -------------------------------------------------------------------------------- Encounter Discharge Information Details Patient Name: [name] Date of Service: 05/26/2019 1:00 PM Medical Record Number: [id number] Patient Account Number: [id number] Date of Birth/Sex: 06-12-1928 (85 y.o. F) Treating RN: Montey Hora Primary Care Torina Ey: Park Liter Other Clinician: Referring  Harvy Riera: Park Liter Treating Zeriah Baysinger/Extender: [name], [name] Weeks in Treatment: 11 Encounter Discharge Information Items Discharge Condition: Stable Ambulatory Status: Ambulatory Discharge Destination: Home Transportation: Private Auto Accompanied By: self Schedule Follow-up Appointment: Yes Clinical Summary of Care: Electronic Signature(s) Signed: 05/26/2019 4:20:48 PM By: Montey Hora Entered By: Montey Hora on 05/26/2019 13:43:06 Laymon, Rashiya Lemmie Evens ([id number]) -------------------------------------------------------------------------------- Lower Extremity Assessment Details Patient Name: Limbach, Emonee H. Date of Service: 05/26/2019 1:00 PM Medical Record Number: [id number] Patient Account Number: [id number] Date of Birth/Sex: 1927/10/19 (85 y.o. F) Treating RN: Harold Barban Primary Care Therese Rocco: Park Liter Other Clinician: Referring Thanh Mottern: Park Liter Treating Alberta Cairns/Extender: [name], [name] Weeks in Treatment: 11 Electronic Signature(s) Signed: 05/26/2019 3:57:29 PM By: Harold Barban Entered By: Harold Barban on 05/26/2019 13:11:59 Ozaki, Shanica HMarland Kitchen ([id number]) -------------------------------------------------------------------------------- Multi Wound Chart Details Patient Name: Rollings, Kaya H. Date of Service: 05/26/2019 1:00 PM Medical Record Number: [id number] Patient Account Number: [id number] Date of Birth/Sex: 1927/11/23 (85 y.o. F) Treating RN: Montey Hora Primary Care Sharniece Gibbon: Park Liter Other Clinician: Referring Shenique Childers: Park Liter Treating Ante Arredondo/Extender: [name], [name] Weeks in Treatment: 11 Vital Signs Height(in): 60 Pulse(bpm): 62 Weight(lbs): 13 Blood Pressure(mmHg): 138/42 Body Mass Index(BMI): 18 Temperature(F): 97.8 Respiratory Rate 16 (breaths/min): Photos: [N/A:N/A] Wound Location: Right Elbow N/A N/A Wounding Event: Trauma N/A N/A Primary Etiology: Skin Tear N/A N/A Comorbid  History: Cataracts, Glaucoma, Asthma, N/A N/A Chronic Obstructive Pulmonary Disease (COPD), Arrhythmia, Hypertension Date Acquired: 05/11/2019 N/A N/A Weeks of Treatment: 1 N/A N/A Wound Status: Open N/A N/A Measurements L x W x D 1.5x1.5x0.1 N/A N/A (cm) Area (cm) : 1.767 N/A N/A Volume (cm) : 0.177 N/A N/A % Reduction in Area: -7.20% N/A N/A % Reduction in Volume: -7.30% N/A N/A Classification: Full Thickness Without N/A N/A Exposed Support Structures Exudate Amount: Medium N/A N/A Exudate Type: Serous N/A N/A Exudate Color: amber N/A N/A Wound Margin: Flat and Intact N/A N/A  Granulation Amount: Small (1-33%) N/A N/A Granulation Quality: Pink N/A N/A Necrotic Amount: Large (67-100%) N/A N/A Exposed Structures: Fat Layer (Subcutaneous N/A N/A Tissue) Exposed: Yes Fascia: No Tendon: No Marcella, Cathrine H. ([id number]) Muscle: No Joint: No Bone: No Epithelialization: Small (1-33%) N/A N/A Treatment Notes Electronic Signature(s) Signed: 05/26/2019 4:20:48 PM By: Montey Hora Entered By: Montey Hora on 05/26/2019 13:39:37 Pinkard, [name] ([id number]) -------------------------------------------------------------------------------- Multi-Disciplinary Care Plan Details Patient Name: [name] Date of Service: 05/26/2019 1:00 PM Medical Record Number: [id number] Patient Account Number: [id number] Date of Birth/Sex: 08/14/1928 (85 y.o. F) Treating RN: Montey Hora Primary Care Stefania Goulart: Park Liter Other Clinician: Referring Bryli Mantey: Park Liter Treating Jenay Morici/Extender: [name], [name] Weeks in Treatment: 21 Active Inactive Abuse / Safety / Falls / Self Care Management Nursing Diagnoses: Potential for falls Goals: Patient will not experience any injury related to falls Date Initiated: 03/10/2019 Target Resolution Date: 06/13/2019 Goal Status: Active Interventions: Assess fall risk on admission and as needed Notes: Orientation to the Wound  Care Program Nursing Diagnoses: Knowledge deficit related to the wound healing center program Goals: Patient/caregiver will verbalize understanding of the [place] Program Date Initiated: 03/10/2019 Target Resolution Date: 06/13/2019 Goal Status: Active Interventions: Provide education on orientation to the wound center Notes: Wound/Skin Impairment Nursing Diagnoses: Impaired tissue integrity Goals: Ulcer/skin breakdown will heal within 14 weeks Date Initiated: 03/10/2019 Target Resolution Date: 06/13/2019 Goal Status: Active Interventions: Assess patient/caregiver ability to obtain necessary supplies Angelo, Carolena H. ([id number]) Assess patient/caregiver ability to perform ulcer/skin care regimen upon admission and as needed Assess ulceration(s) every visit Notes: Electronic Signature(s) Signed: 05/26/2019 4:20:48 PM By: Montey Hora Entered By: Montey Hora on 05/26/2019 13:37:39 Pucillo, Lasonja H. ([id number]) -------------------------------------------------------------------------------- Pain Assessment Details Patient Name: [name] Date of Service: 05/26/2019 1:00 PM Medical Record Number: [id number] Patient Account Number: [id number] Date of Birth/Sex: [birth year] (85 y.o. F) Treating RN: Harold Barban Primary Care Waddell Iten: Park Liter Other Clinician: Referring Evelena Masci: Park Liter Treating Maleiya Pergola/Extender: [name], [name] Weeks in Treatment: 11 Active Problems Location of Pain Severity and Description of Pain Patient Has Paino No Site Locations Pain Management and Medication Current Pain Management: Electronic Signature(s) Signed: 05/26/2019 3:57:29 PM By: Harold Barban Entered By: Harold Barban on 05/26/2019 13:07:40 Simones, Rushie Lemmie Evens ([id number]) -------------------------------------------------------------------------------- Patient/Caregiver Education Details Patient Name: [name] Date of Service:  05/26/2019 1:00 PM Medical Record Number: [id number] Patient Account Number: [id number] Date of Birth/Gender: May 20, 1928 (85 y.o. F) Treating RN: Montey Hora Primary Care Physician: Park Liter Other Clinician: Referring Physician: Park Liter Treating Physician/Extender: Sharalyn Ink in Treatment: 11 Education Assessment Education Provided To: Patient Education Topics Provided Wound/Skin Impairment: Handouts: Other: wound care as ordered Methods: Explain/Verbal Responses: State content correctly Electronic Signature(s) Signed: 05/26/2019 4:20:48 PM By: Montey Hora Entered By: Montey Hora on 05/26/2019 13:42:38 Mcdermid, Izabellah H. ([id number]) -------------------------------------------------------------------------------- Wound Assessment Details Patient Name: Keil, Abbagail H. Date of Service: 05/26/2019 1:00 PM Medical Record Number: [id number] Patient Account Number: [id number] Date of Birth/Sex: Feb 08, 1928 (85 y.o. F) Treating RN: Harold Barban Primary Care Jeananne Bedwell: Park Liter Other Clinician: Referring Naksh Radi: Park Liter Treating Burgess Sheriff/Extender: [name], [name] Weeks in Treatment: 11 Wound Status Wound Number: 16 Primary Skin Tear Etiology: Wound Location: Right Elbow Wound Open Wounding Event: Trauma Status: Date Acquired: 05/11/2019 Comorbid Cataracts, Glaucoma, Asthma, Chronic Weeks Of Treatment: 1 History: Obstructive Pulmonary Disease (COPD), Clustered Wound: No Arrhythmia, Hypertension Photos Wound Measurements Length: (cm) 1.5 Width: (cm) 1.5 Depth: (cm) 0.1 Area: (cm) 1.767 Volume: (cm)  0.177 % Reduction in Area: -7.2% % Reduction in Volume: -7.3% Epithelialization: Small (1-33%) Tunneling: No Undermining: No Wound Description Full Thickness Without Exposed Support Foul Odo Classification: Structures Slough/F Wound Margin: Flat and Intact Exudate Medium Amount: Exudate Type: Serous Exudate Color:  amber r After Cleansing: No ibrino Yes Wound Bed Granulation Amount: Small (1-33%) Exposed Structure Granulation Quality: Pink Fascia Exposed: No Necrotic Amount: Large (67-100%) Fat Layer (Subcutaneous Tissue) Exposed: Yes Tendon Exposed: No Muscle Exposed: No Joint Exposed: No Bone Exposed: No Jerrell, Lavera H. ([id number]) Treatment Notes Wound #16 (Right Elbow) Notes ABX ointment, xeroform, rolled gauze, tape, stretchy Electronic Signature(s) Signed: 05/26/2019 3:57:29 PM By: Harold Barban Entered By: Harold Barban on 05/26/2019 13:11:13 Dobesh, Evianna H. ([id number]) -------------------------------------------------------------------------------- Vitals Details Patient Name: [name] Date of Service: 05/26/2019 1:00 PM Medical Record Number: [id number] Patient Account Number: [id number] Date of Birth/Sex: 1927/11/18 (85 y.o. F) Treating RN: Harold Barban Primary Care Allina Riches: Park Liter Other Clinician: Referring Perlie Stene: Park Liter Treating Aylin Rhoads/Extender: [name], [name] Weeks in Treatment: 11 Vital Signs Time Taken: 13:05 Temperature (F): 97.8 Height (in): 60 Pulse (bpm): 67 Weight (lbs): 92 Respiratory Rate (breaths/min): 16 Body Mass Index (BMI): 18 Blood Pressure (mmHg): 138/42 Reference Range: 80 - 120 mg / dl Electronic Signature(s) Signed: 05/26/2019 3:57:29 PM By: Harold Barban Entered By: Harold Barban on 05/26/2019 13:08:22

## 2019-05-26 NOTE — Progress Notes (Addendum)
DEE, [name] ([id number]) Visit Report for 05/26/2019 Chief Complaint Document Details Patient Name: [name]. Date of Service: 05/26/2019 1:00 PM Medical Record Number: [id number] Patient Account Number: [id number] Date of Birth/Sex: 1928/07/11 (85 y.o. F) Treating RN: [name] Primary Care Provider: Park Wise Other Clinician: Referring Provider: Park Wise Treating Provider/Extender: [name] Weeks in Treatment: 11 Information Obtained from: Patient Chief Complaint Right elbow skin tear Electronic Signature(s) Signed: 05/26/2019 12:50:34 PM By: Carol Keeler PA-C Entered By: [name] on 05/26/2019 12:50:34 [name] ([id number]) -------------------------------------------------------------------------------- HPI Details Patient Name: [name]. Date of Service: 05/26/2019 1:00 PM Medical Record Number: [id number] Patient Account Number: [id number] Date of Birth/Sex: 09-20-1927 (85 y.o. F) Treating RN: [name] Primary Care Provider: Park Wise Other Clinician: Referring Provider: Park Wise Treating Provider/Extender: [name] Weeks in Treatment: 11 History of Present Illness HPI Description: 02/12/18 ADMISSION This is an 85 year old woman who is recently moved to [place] from [place]. Her story began in February where she fell on some ice suffering extensive lacerations to her bilateral lower extremities. She is able to show me extensive pictures of the right lower leg but with going to a wound care center and [place] 3 times a week these eventually closed over. She has been left with 1 area on the upper lateral left calf. She has been applying Neosporin to this and applying a bandage. Currently this measures 2 x 1.5 cm. The patient is not a diabetic. She is an ex-smoker quitting 40 years ago. She does have COPD by description. ABIs in our clinic were 1.32 on the right and 1.03 on the  left 02/19/18; right lower leg wound which was initially trauma. The area that we look that last week is smaller. Still covered in a nonviable surface however. We are using Iodoflex 02/26/18; right lower leg wound which was initially trauma in the setting of chronic venous insufficiency. Surface of the wound looks much better healthy granulation advancing epithelialization. We have good edema control 03/05/18; right lower leg wound which was initially trauma in the setting of chronic venous insufficiency. She continues to make nice progress here. We have good edema control oHe arrives today with a new traumatic laceration on her right dorsal forearm which she states happened while she was putting on a sweatshirt 03/12/18; right lower leg wound as closed however it still looks vulnerable. She has chronic venous insufficiency. oThe new wound from last week a traumatic area on her right dorsal or arm unfortunately does not have a viable surface. I remove some nonviable skin and necrotic subcutaneous debris from the wound surface. Hemostasis with silver nitrate and direct pressure 03/19/18; right lower leg wound is closed. She has chronic venous insufficiency and will need ongoing compression oThe new wounds from 2 weeks ago on the right upper elbow is closed. The area on her right dorsal forearm continues to have a nonviable surface requiring debridement 03/26/18; we'll need to look into ongoing compression for her legs. 2 wounds on the right upper elbow is closed. The area on the right dorsal forearm looks better. Hydrofera Blue secondary to hypertrophic granulation 04/02/18; he has compression stockings although she did not wear them today. Severe chronic venous insufficiency. She has 2 wounds on the right upper elbow which I said were closed last week which actually are although they're very small and she has a smaller clean wound on the right dorsal forearm. We've been using Hydrofera Blue secondary to  some  upper granulation. All of this looks better 04/09/18-She is seen in follow up evaluation for a right elbow and right forearm skin tear. The right elbow is healed. We will continue with same treatment plan and she'll follow next week 04/16/18-She is seen in follow-up evaluation for right forearm skin tear, this is essentially healed. We will cover with foam border and she will follow-up next week 04/23/18; the patient arrives with a new skin tear on her right dorsal arm just below the elbow. She got this while carrying a box. She has a linear skin tear. There is no depth I don't think this should've been sutured. There is a skin flap medially I'm not sure if this will be viable or not 04/30/18; the new skin tear from last week unfortunately has remained viable in terms of the skin flap. She has a small open area that looks healthy. Using moistened silver collagen 05/07/18; right dorsal arm skin tear. Nonviable tissue over the remnants of the wound. We've been using silver collagen 05/21/18; right dorsal arm skin tear. Nonviable tissue over a small remnant of the wound was washed off. We've been using silver collagen which we will continue 06/04/18; the right dorsal arm skin tear has healed. She arrives today with a traumatic wound just above the olecranon of her left elbow. This is a skin tear with a nonviable nonadherent flap which was removed. We'll use silver collagen here. Also noteworthy she arrives without her compression stockings 06/18/18; the area just above the olecranon of her left elbow. It is smaller and generally has a healthy surface. I was surprised [name]. ([id number]) to learn that the patient is actually changing this herself although. Appears this week she was putting some topical lidocaine on this that she bought over-the-counter at the drugstore. She did get supplies at home she didn't know how to put it on. She drove herself here today i.e. not eligible for home  health Readmission: 11/04/18 on evaluation today patient presents for evaluation our clinic news to include the she has on her lower extremity which occurred as the result of having hit this on a piece of furniture. This was back in December. She states that her daughter has been trying to get her to come into have this evaluated here the wound care center but she was being somewhat stubborn. Nonetheless upon evaluation today the wound really appears to be potentially healed she does have some swelling of lower extremity which I think will come into play. With that being said he tells me that even yesterday this was still making clear fluid and therefore there may be some issues with continued problems with the swelling and drainage secondary to her venous stasis. Nonetheless overall wound appears to be doing fairly well which is good news. 11/11/18 on evaluation today patient actually appears to be doing excellent in the area of question that was green just the day before he saw her last time actually appears to have not drain that always in past week. I think this is done very well and at this point though she's had some discomfort I do not see any evidence of open wound which he needs to continue see wound care. Readmission: 03/10/19 on evaluation today patient presents for reevaluation here in our clinic concerning issues that she actually is having with the new problem on her right lower leg due to trauma that occurred when a car door shut on her leg last Thursday. This is not been quite a  week and she does have several skin tears unfortunately that resulted from this injury. Fortunately there's no signs of active infection at this time. No fevers, chills, nausea, or vomiting noted at this time. She does have some hot tissue noted at this point that has me concerned about the possibility of needing to move this in order to allow the areas to heal appropriately. Fortunately there's no signs of  active infection at this time. 03/17/19-Patient returns to clinic at 1 week, we have been using Prisma on the larger wounds with Kerlix and Xeroform. As many as 5 wounds were number last time 3 of which have closed, leaving the right proximal tibial and right distal medial leg wound both of which are looking good 03/24/19 on evaluation today patient appears to be doing better in regard to her wounds at this time. Fortunately there is no sign of active infection at this time. No fevers chills noted. She's been tolerating the dressing changes without complication which is good news she's been performing these herself it sounds like home health is not going to be coming out it sounds as if they feel like she may be able to take care of yourself and not be technically homebound. Nonetheless though that is unfortunate it does seem like she's done fairly well with taking care of yourself from the standpoint of the dressings. 03/31/19 on evaluation today patient actually appears to be doing well with regard to her lower extremity ulcers. She had one area to heal today and in regard to her right lower extremity were she continues to have an open wound I did have to perform some sharp debridement today. Subsequently the patient still has an area of what appears to be possibly a small hematoma on the anterior portion of her left lower extremity. This is not open enough at this time but nonetheless does seem to show some evidence of hardening which is not necessarily a bad thing. Eventually this may pop off but again I'm not interested in really removing this prematurely. 04/07/19 on evaluation today patient actually appears to be doing well with regard to her lower extremity ulcers. She is been tolerating the dressing changes without complication. She seems to be making excellent progress which is great news. She does have one small new skin tear area but the good news is she was able to pull the skin back over  and it really looks like it is healing quite nicely. Overall that is just a very small region and again seems to be healing nicely. 04/14/19 upon evaluation today patient actually appears to be doing quite well with regard to the wounds on the right lower extremity. In fact everything appears to be healed as of today. This is excellent news. She does however have the area on the left lower extremity which is showing some signs of loosening up we have been watching this area it is no longer fluctuant and in fact with the Betadine on it it is been slowly dry now but is now lifting up on the edge. There may be a wound underneath this region. 04/21/2019 on evaluation today patient appears to be doing excellent in regard to her bilateral lower extremities. In fact she has been tolerating the dressing changes without complication does not seem to show any signs of infection at this point. In fact based on what I am seeing currently I think she is completely healed. [name], [name] 05/04/2019 on evaluation today patient actually appears to be  doing quite well with regard to her right elbow. She has been tolerating the dressing changes without complication. Fortunately there is no signs of active infection at this time. Overall I am very pleased with how things are progressing. The elbow almost appears to be completely healed though it is not 100% today. 05/14/2019 on evaluation today patient actually appears to be doing well with regard to her elbow skin tear that we have been treating in fact this appears to be completely healed. Unfortunately she has a new skin tear noted as of today which actually occurred in the last week since I previously saw her. This again is unfortunate that it has been so long because I am not going to be able to actually pull the skin over where you have to trim this away and let it heal by second intent. This new wound is in the right elbow region as well but  again separate from the original ulcer. 05/19/2019 on evaluation today patient appears to be doing well with regard to her skin tear on her elbow. She has been tolerating the dressing changes without complication. Fortunately there is no signs of active infection at this time. No fever chills noted. Overall I am very pleased with how things appear today. She is not having any significant pain which is also excellent news. 05/26/2019 on evaluation today patient actually appears to be doing well with regard to her right elbow ulcer. She has been tolerating the dressing changes without complication. Fortunately there is no signs of active infection at this time. No fevers, chills, nausea, vomiting, or diarrhea. Electronic Signature(s) Signed: 05/26/2019 1:41:07 PM By: Carol Keeler PA-C Entered By: [name] on 05/26/2019 13:41:06 [name], [name] -------------------------------------------------------------------------------- Physical Exam Details Patient Name: [name], [name] Date of Service: 05/26/2019 1:00 PM Medical Record Number: [id number] Patient Account Number: [id number] Date of Birth/Sex: [birth year] (85 y.o. F) Treating RN: [name] Primary Care Provider: Park Wise Other Clinician: Referring Provider: Park Wise Treating Provider/Extender: [name] Weeks in Treatment: 67 Constitutional Well-nourished and well-hydrated in no acute distress. Respiratory normal breathing without difficulty. clear to auscultation bilaterally. Cardiovascular regular rate and rhythm with normal S1, S2. Psychiatric this patient is able to make decisions and demonstrates good insight into disease process. Alert and Oriented x 3. pleasant and cooperative. Notes Patient's wound bed currently showed signs of excellent granulation there is no signs of active infection and overall and very pleased with how things have gone between last week and this week.  Overall I believe that the patient is progressing quite nicely. The elbow is healing well and I see no signs of infection or other complications. Electronic Signature(s) Signed: 05/26/2019 1:42:10 PM By: Carol Keeler PA-C Entered By: [name] on 05/26/2019 13:42:10 [name], [name] -------------------------------------------------------------------------------- Physician Orders Details Patient Name: [name]. Date of Service: 05/26/2019 1:00 PM Medical Record Number: [id number] Patient Account Number: [id number] Date of Birth/Sex: 06-Oct-1927 (85 y.o. F) Treating RN: [name] Primary Care Provider: Park Wise Other Clinician: Referring Provider: Park Wise Treating Provider/Extender: [name] Weeks in Treatment: 38 Verbal / Phone Orders: No Diagnosis Coding ICD-10 Coding Code Description I87.2 Venous insufficiency (chronic) (peripheral) S51.001A Unspecified open wound of right elbow, initial encounter Wound Cleansing Wound #16 Right Elbow o Clean wound with Normal Saline. Primary Wound Dressing Wound #16 Right Elbow o Xeroform - double layer over antibiotic ointment o Other: - antibiotic ointment Secondary Dressing Wound #16 Right Elbow o  Dry Gauze o Conform/Kerlix o Other - stetch net Dressing Change Frequency Wound #16 Right Elbow o Change dressing every other day. Follow-up Appointments Wound #16 Right Elbow o Return Appointment in 1 week. Electronic Signature(s) Signed: 05/26/2019 4:20:48 PM By: [name] Signed: 05/26/2019 6:05:24 PM By: Carol Keeler PA-C Entered By: [name] on 05/26/2019 13:40:04 [name], [name] -------------------------------------------------------------------------------- Problem List Details Patient Name: [name], [name] Date of Service: 05/26/2019 1:00 PM Medical Record Number: [id number] Patient Account Number: [id number] Date of  Birth/Sex: 11/12/1927 (85 y.o. F) Treating RN: [name] Primary Care Provider: Park Wise Other Clinician: Referring Provider: Park Wise Treating Provider/Extender: [name] Weeks in Treatment: 11 Active Problems ICD-10 Evaluated Encounter Code Description Active Date Today Diagnosis I87.2 Venous insufficiency (chronic) (peripheral) 03/10/2019 No Yes S51.001A Unspecified open wound of right elbow, initial encounter 04/28/2019 No Yes Inactive Problems Resolved Problems ICD-10 Code Description Active Date Resolved Date L97.812 Non-pressure chronic ulcer of other part of right lower leg with fat 03/10/2019 03/10/2019 layer exposed Electronic Signature(s) Signed: 05/26/2019 12:50:28 PM By: Carol Keeler PA-C Entered By: [name] on 05/26/2019 12:50:28 [name], [name] ([id number]) -------------------------------------------------------------------------------- Progress Note Details Patient Name: [name], [name] Date of Service: 05/26/2019 1:00 PM Medical Record Number: [id number] Patient Account Number: [id number] Date of Birth/Sex: 11/04/1927 (85 y.o. F) Treating RN: [name] Primary Care Provider: Park Wise Other Clinician: Referring Provider: Park Wise Treating Provider/Extender: [name] Weeks in Treatment: 11 Subjective Chief Complaint Information obtained from Patient Right elbow skin tear History of Present Illness (HPI) 02/12/18 ADMISSION This is an 85 year old woman who is recently moved to [place] from [place]. Her story began in February where she fell on some ice suffering extensive lacerations to her bilateral lower extremities. She is able to show me extensive pictures of the right lower leg but with going to a wound care center and [place] 3 times a week these eventually closed over. She has been left with 1 area on the upper lateral left calf. She has been applying Neosporin to this and applying a  bandage. Currently this measures 2 x 1.5 cm. The patient is not a diabetic. She is an ex-smoker quitting 40 years ago. She does have COPD by description. ABIs in our clinic were 1.32 on the right and 1.03 on the left 02/19/18; right lower leg wound which was initially trauma. The area that we look that last week is smaller. Still covered in a nonviable surface however. We are using Iodoflex 02/26/18; right lower leg wound which was initially trauma in the setting of chronic venous insufficiency. Surface of the wound looks much better healthy granulation advancing epithelialization. We have good edema control 03/05/18; right lower leg wound which was initially trauma in the setting of chronic venous insufficiency. She continues to make nice progress here. We have good edema control He arrives today with a new traumatic laceration on her right dorsal forearm which she states happened while she was putting on a sweatshirt 03/12/18; right lower leg wound as closed however it still looks vulnerable. She has chronic venous insufficiency. The new wound from last week a traumatic area on her right dorsal or arm unfortunately does not have a viable surface. I remove some nonviable skin and necrotic subcutaneous debris from the wound surface. Hemostasis with silver nitrate and direct pressure 03/19/18; right lower leg wound is closed. She has chronic venous insufficiency and will need ongoing compression The new wounds from 2 weeks ago on  the right upper elbow is closed. The area on her right dorsal forearm continues to have a nonviable surface requiring debridement 03/26/18; we'll need to look into ongoing compression for her legs. 2 wounds on the right upper elbow is closed. The area on the right dorsal forearm looks better. Hydrofera Blue secondary to hypertrophic granulation 04/02/18; he has compression stockings although she did not wear them today. Severe chronic venous insufficiency. She has 2 wounds on  the right upper elbow which I said were closed last week which actually are although they're very small and she has a smaller clean wound on the right dorsal forearm. We've been using Hydrofera Blue secondary to some upper granulation. All of this looks better 04/09/18-She is seen in follow up evaluation for a right elbow and right forearm skin tear. The right elbow is healed. We will continue with same treatment plan and she'll follow next week 04/16/18-She is seen in follow-up evaluation for right forearm skin tear, this is essentially healed. We will cover with foam border and she will follow-up next week 04/23/18; the patient arrives with a new skin tear on her right dorsal arm just below the elbow. She got this while carrying a box. She has a linear skin tear. There is no depth I don't think this should've been sutured. There is a skin flap medially I'm not sure if this will be viable or not 04/30/18; the new skin tear from last week unfortunately has remained viable in terms of the skin flap. She has a small open area that looks healthy. Using moistened silver collagen 05/07/18; right dorsal arm skin tear. Nonviable tissue over the remnants of the wound. We've been using silver collagen [name], [name] 05/21/18; right dorsal arm skin tear. Nonviable tissue over a small remnant of the wound was washed off. We've been using silver collagen which we will continue 06/04/18; the right dorsal arm skin tear has healed. She arrives today with a traumatic wound just above the olecranon of her left elbow. This is a skin tear with a nonviable nonadherent flap which was removed. We'll use silver collagen here. Also noteworthy she arrives without her compression stockings 06/18/18; the area just above the olecranon of her left elbow. It is smaller and generally has a healthy surface. I was surprised to learn that the patient is actually changing this herself although. Appears this week she was  putting some topical lidocaine on this that she bought over-the-counter at the drugstore. She did get supplies at home she didn't know how to put it on. She drove herself here today i.e. not eligible for home health Readmission: 11/04/18 on evaluation today patient presents for evaluation our clinic news to include the she has on her lower extremity which occurred as the result of having hit this on a piece of furniture. This was back in December. She states that her daughter has been trying to get her to come into have this evaluated here the wound care center but she was being somewhat stubborn. Nonetheless upon evaluation today the wound really appears to be potentially healed she does have some swelling of lower extremity which I think will come into play. With that being said he tells me that even yesterday this was still making clear fluid and therefore there may be some issues with continued problems with the swelling and drainage secondary to her venous stasis. Nonetheless overall wound appears to be doing fairly well which is good news. 11/11/18 on evaluation today patient actually  appears to be doing excellent in the area of question that was green just the day before he saw her last time actually appears to have not drain that always in past week. I think this is done very well and at this point though she's had some discomfort I do not see any evidence of open wound which he needs to continue see wound care. Readmission: 03/10/19 on evaluation today patient presents for reevaluation here in our clinic concerning issues that she actually is having with the new problem on her right lower leg due to trauma that occurred when a car door shut on her leg last Thursday. This is not been quite a week and she does have several skin tears unfortunately that resulted from this injury. Fortunately there's no signs of active infection at this time. No fevers, chills, nausea, or vomiting noted at this  time. She does have some hot tissue noted at this point that has me concerned about the possibility of needing to move this in order to allow the areas to heal appropriately. Fortunately there's no signs of active infection at this time. 03/17/19-Patient returns to clinic at 1 week, we have been using Prisma on the larger wounds with Kerlix and Xeroform. As many as 5 wounds were number last time 3 of which have closed, leaving the right proximal tibial and right distal medial leg wound both of which are looking good 03/24/19 on evaluation today patient appears to be doing better in regard to her wounds at this time. Fortunately there is no sign of active infection at this time. No fevers chills noted. She's been tolerating the dressing changes without complication which is good news she's been performing these herself it sounds like home health is not going to be coming out it sounds as if they feel like she may be able to take care of yourself and not be technically homebound. Nonetheless though that is unfortunate it does seem like she's done fairly well with taking care of yourself from the standpoint of the dressings. 03/31/19 on evaluation today patient actually appears to be doing well with regard to her lower extremity ulcers. She had one area to heal today and in regard to her right lower extremity were she continues to have an open wound I did have to perform some sharp debridement today. Subsequently the patient still has an area of what appears to be possibly a small hematoma on the anterior portion of her left lower extremity. This is not open enough at this time but nonetheless does seem to show some evidence of hardening which is not necessarily a bad thing. Eventually this may pop off but again I'm not interested in really removing this prematurely. 04/07/19 on evaluation today patient actually appears to be doing well with regard to her lower extremity ulcers. She is been tolerating the  dressing changes without complication. She seems to be making excellent progress which is great news. She does have one small new skin tear area but the good news is she was able to pull the skin back over and it really looks like it is healing quite nicely. Overall that is just a very small region and again seems to be healing nicely. 04/14/19 upon evaluation today patient actually appears to be doing quite well with regard to the wounds on the right lower extremity. In fact everything appears to be healed as of today. This is excellent news. She does however have the area on the left lower extremity which  is showing some signs of loosening up we have been watching this area it is no longer fluctuant and in fact with the Betadine on it it is been slowly dry now but is now lifting up on the edge. There may be a wound underneath [name], [name] this region. 04/21/2019 on evaluation today patient appears to be doing excellent in regard to her bilateral lower extremities. In fact she has been tolerating the dressing changes without complication does not seem to show any signs of infection at this point. In fact based on what I am seeing currently I think she is completely healed. 05/04/2019 on evaluation today patient actually appears to be doing quite well with regard to her right elbow. She has been tolerating the dressing changes without complication. Fortunately there is no signs of active infection at this time. Overall I am very pleased with how things are progressing. The elbow almost appears to be completely healed though it is not 100% today. 05/14/2019 on evaluation today patient actually appears to be doing well with regard to her elbow skin tear that we have been treating in fact this appears to be completely healed. Unfortunately she has a new skin tear noted as of today which actually occurred in the last week since I previously saw her. This again is unfortunate that it has  been so long because I am not going to be able to actually pull the skin over where you have to trim this away and let it heal by second intent. This new wound is in the right elbow region as well but again separate from the original ulcer. 05/19/2019 on evaluation today patient appears to be doing well with regard to her skin tear on her elbow. She has been tolerating the dressing changes without complication. Fortunately there is no signs of active infection at this time. No fever chills noted. Overall I am very pleased with how things appear today. She is not having any significant pain which is also excellent news. 05/26/2019 on evaluation today patient actually appears to be doing well with regard to her right elbow ulcer. She has been tolerating the dressing changes without complication. Fortunately there is no signs of active infection at this time. No fevers, chills, nausea, vomiting, or diarrhea. Patient History Information obtained from Patient. Family History Cancer - Siblings,Paternal Grandparents, Diabetes - Siblings, Heart Disease - Mother,Father,Child,Paternal Grandparents, Hypertension - Child, No family history of Hereditary Spherocytosis, Kidney Disease, Lung Disease, Seizures, Stroke, Thyroid Problems, Tuberculosis. Social History Former smoker - quit 40 yrs ago, Marital Status - Divorced, Alcohol Use - Never, Drug Use - No History, Caffeine Use - Rarely. Medical History Eyes Patient has history of Cataracts - surgery 2000, Glaucoma Respiratory Patient has history of Asthma, Chronic Obstructive Pulmonary Disease (COPD) Denies history of Pneumothorax, Sleep Apnea, Tuberculosis Cardiovascular Patient has history of Arrhythmia - a-fib, Hypertension Denies history of Angina, Congestive Heart Failure, Coronary Artery Disease, Deep Vein Thrombosis, Hypotension, Myocardial Infarction, Peripheral Arterial Disease, Peripheral Venous Disease, Phlebitis,  Vasculitis Endocrine Denies history of Type I Diabetes, Type II Diabetes Genitourinary Denies history of End Stage Renal Disease Immunological Denies history of Lupus Erythematosus, Raynaud s, Scleroderma Integumentary (Skin) Denies history of History of Burn, History of pressure wounds Musculoskeletal Denies history of Gout, Rheumatoid Arthritis, Osteoarthritis, Osteomyelitis [name], [name] ([id number]) Neurologic Denies history of Dementia, Neuropathy, Quadriplegia, Paraplegia, Seizure Disorder Oncologic Denies history of Received Chemotherapy, Received Radiation Psychiatric Denies history of Anorexia/bulimia, Confinement Anxiety Hospitalization/Surgery History - Colon cancer.  Review of Systems (ROS) Constitutional Symptoms (General Health) Denies complaints or symptoms of Fatigue, Fever, Chills, Marked Weight Change. Respiratory Denies complaints or symptoms of Chronic or frequent coughs, Shortness of Breath. Cardiovascular Denies complaints or symptoms of Chest pain, LE edema. Psychiatric Denies complaints or symptoms of Anxiety, Claustrophobia. Objective Constitutional Well-nourished and well-hydrated in no acute distress. Vitals Time Taken: 1:05 PM, Height: 60 in, Weight: 92 lbs, BMI: 18, Temperature: 97.8 F, Pulse: 67 bpm, Respiratory Rate: 16 breaths/min, Blood Pressure: 138/42 mmHg. Respiratory normal breathing without difficulty. clear to auscultation bilaterally. Cardiovascular regular rate and rhythm with normal S1, S2. Psychiatric this patient is able to make decisions and demonstrates good insight into disease process. Alert and Oriented x 3. pleasant and cooperative. General Notes: Patient's wound bed currently showed signs of excellent granulation there is no signs of active infection and overall and very pleased with how things have gone between last week and this week. Overall I believe that the patient is progressing quite nicely. The elbow is  healing well and I see no signs of infection or other complications. Integumentary (Hair, Skin) Wound #16 status is Open. Original cause of wound was Trauma. The wound is located on the Right Elbow. The wound measures 1.5cm length x 1.5cm width x 0.1cm depth; 1.767cm^2 area and 0.177cm^3 volume. There is Fat Layer (Subcutaneous Tissue) Exposed exposed. There is no tunneling or undermining noted. There is a medium amount of serous drainage noted. The wound margin is flat and intact. There is small (1-33%) pink granulation within the wound bed. There is a large (67-100%) amount of necrotic tissue within the wound bed. [name], [name] Assessment Active Problems ICD-10 Venous insufficiency (chronic) (peripheral) Unspecified open wound of right elbow, initial encounter Plan Wound Cleansing: Wound #16 Right Elbow: Clean wound with Normal Saline. Primary Wound Dressing: Wound #16 Right Elbow: Xeroform - double layer over antibiotic ointment Other: - antibiotic ointment Secondary Dressing: Wound #16 Right Elbow: Dry Gauze Conform/Kerlix Other - stetch net Dressing Change Frequency: Wound #16 Right Elbow: Change dressing every other day. Follow-up Appointments: Wound #16 Right Elbow: Return Appointment in 1 week. 1. I would recommend that we go ahead and continue with the current wound care measures including the antibiotic ointment along with the Xeroform that seems to be doing well for the patient. 2. I would recommend as well that she continue to clean the area with mild soap and water such as Dial antibacterial soap as I feel like this is definitely a good way to go at this point. 3. I am in a suggest that we continue with the dry gauze and stretch net to hold this in place along with rolled gauze as that keeps any adhesive off of her elbow which I think is also doing well for her being that her skin is so thin. We will see patient back for reevaluation in 1 week  here in the clinic. If anything worsens or changes patient will contact our office for additional recommendations. Electronic Signature(s) Signed: 05/26/2019 1:42:50 PM By: Carol Keeler PA-C Entered By: [name] on 05/26/2019 13:42:49 Hilmes, [name] -------------------------------------------------------------------------------- ROS/PFSH Details Patient Name: [name]. Date of Service: 05/26/2019 1:00 PM Medical Record Number: [id number] Patient Account Number: [id number] Date of Birth/Sex: 12-04-1927 (85 y.o. F) Treating RN: [name] Primary Care Provider: Park Wise Other Clinician: Referring Provider: Park Wise Treating Provider/Extender: [name] Weeks in Treatment: 11 Information Obtained From Patient Constitutional Symptoms (General Health) Complaints and  Symptoms: Negative for: Fatigue; Fever; Chills; Marked Weight Change Respiratory Complaints and Symptoms: Negative for: Chronic or frequent coughs; Shortness of Breath Medical History: Positive for: Asthma; Chronic Obstructive Pulmonary Disease (COPD) Negative for: Pneumothorax; Sleep Apnea; Tuberculosis Cardiovascular Complaints and Symptoms: Negative for: Chest pain; LE edema Medical History: Positive for: Arrhythmia - a-fib; Hypertension Negative for: Angina; Congestive Heart Failure; Coronary Artery Disease; Deep Vein Thrombosis; Hypotension; Myocardial Infarction; Peripheral Arterial Disease; Peripheral Venous Disease; Phlebitis; Vasculitis Psychiatric Complaints and Symptoms: Negative for: Anxiety; Claustrophobia Medical History: Negative for: Anorexia/bulimia; Confinement Anxiety Eyes Medical History: Positive for: Cataracts - surgery 2000; Glaucoma Endocrine Medical History: Negative for: Type I Diabetes; Type II Diabetes Genitourinary Medical History: Negative for: End Stage Renal Disease Ramdass, Lenzie H. ([id number]) Immunological Medical  History: Negative for: Lupus Erythematosus; Raynaudos; Scleroderma Integumentary (Skin) Medical History: Negative for: History of Burn; History of pressure wounds Musculoskeletal Medical History: Negative for: Gout; Rheumatoid Arthritis; Osteoarthritis; Osteomyelitis Neurologic Medical History: Negative for: Dementia; Neuropathy; Quadriplegia; Paraplegia; Seizure Disorder Oncologic Medical History: Negative for: Received Chemotherapy; Received Radiation HBO Extended History Items Eyes: Eyes: Cataracts Glaucoma Immunizations Pneumococcal Vaccine: Received Pneumococcal Vaccination: Yes Implantable Devices None Hospitalization / Surgery History Type of Hospitalization/Surgery Colon cancer Family and Social History Cancer: Yes - Siblings,Paternal Grandparents; Diabetes: Yes - Siblings; Heart Disease: Yes - Mother,Father,Child,Paternal Grandparents; Hereditary Spherocytosis: No; Hypertension: Yes - Child; Kidney Disease: No; Lung Disease: No; Seizures: No; Stroke: No; Thyroid Problems: No; Tuberculosis: No; Former smoker - quit 40 yrs ago; Marital Status - Divorced; Alcohol Use: Never; Drug Use: No History; Caffeine Use: Rarely; Financial Concerns: No; Food, Clothing or Shelter Needs: No; Support System Lacking: No; Transportation Concerns: No Physician Affirmation I have reviewed and agree with the above information. Electronic Signature(s) Signed: 05/26/2019 4:20:48 PM By: [name] Signed: 05/26/2019 6:05:24 PM By: Carol Keeler PA-C Entered By: [name] on 05/26/2019 13:41:30 Emminger, Daralyn HMarland Kitchen ([id number]) -------------------------------------------------------------------------------- SuperBill Details Patient Name: [name] Date of Service: 05/26/2019 Medical Record Number: [id number] Patient Account Number: [id number] Date of Birth/Sex: 01-24-1928 (85 y.o. F) Treating RN: [name] Primary Care Provider: Park Wise Other  Clinician: Referring Provider: Park Wise Treating Provider/Extender: [name] Weeks in Treatment: 11 Diagnosis Coding ICD-10 Codes Code Description I87.2 Venous insufficiency (chronic) (peripheral) S51.001A Unspecified open wound of right elbow, initial encounter Facility Procedures CPT4 Code: [id number] Description: 99213 - WOUND CARE VISIT-LEV 3 EST PT Modifier: Quantity: 1 Physician Procedures CPT4 Code: [id number] Description: [id number] - WC PHYS LEVEL 4 - EST PT ICD-10 Diagnosis Description I87.2 Venous insufficiency (chronic) (peripheral) S51.001A Unspecified open wound of right elbow, initial encounter Modifier: Quantity: 1 Electronic Signature(s) Signed: 05/26/2019 1:43:10 PM By: Carol Keeler PA-C Entered By: [name] on 05/26/2019 13:43:09

## 2019-05-28 ENCOUNTER — Ambulatory Visit (INDEPENDENT_AMBULATORY_CARE_PROVIDER_SITE_OTHER): Payer: Medicare Other

## 2019-05-28 VITALS — BP 130/64 | HR 71 | Temp 97.4°F | Resp 15 | Ht 60.0 in | Wt 95.8 lb

## 2019-05-28 DIAGNOSIS — Z23 Encounter for immunization: Secondary | ICD-10-CM

## 2019-05-28 DIAGNOSIS — Z0000 Encounter for general adult medical examination without abnormal findings: Secondary | ICD-10-CM | POA: Diagnosis not present

## 2019-05-28 NOTE — Patient Instructions (Addendum)
[name] , Thank you for taking time to come for your Medicare Wellness Visit. I appreciate your ongoing commitment to your health goals. Please review the following plan we discussed and let me know if I can assist you in the future.   Screening recommendations/referrals: Colonoscopy: scheduled  Mammogram: no longer required Bone Density: no longer required Recommended yearly ophthalmology/optometry visit for glaucoma screening and checkup Recommended yearly dental visit for hygiene and checkup  Vaccinations: Influenza vaccine: done today Pneumococcal vaccine: up to date Tdap vaccine: up to date Shingles vaccine: shingrix eligible, check with your insurance or pharmacy for coverage information  Advanced directives: Advance directive discussed with you today. I have provided a copy for you to complete at home and have notarized. Once this is complete please bring a copy in to our office so we can scan it into your chart.  Conditions/risks identified: discussed chronic care management team, Merlene Morse will contact you to discuss this more in depth and discuss assistance with ensure.   Next appointment: follow up in one year for your annual wellenss visit    Preventive Care 38 Years and Older, Female Preventive care refers to lifestyle choices and visits with your health care provider that can promote health and wellness. What does preventive care include?  A yearly physical exam. This is also called an annual well check.  Dental exams once or twice a year.  Routine eye exams. Ask your health care provider how often you should have your eyes checked.  Personal lifestyle choices, including:  Daily care of your teeth and gums.  Regular physical activity.  Eating a healthy diet.  Avoiding tobacco and drug use.  Limiting alcohol use.  Practicing safe sex.  Taking low-dose aspirin every day.  Taking vitamin and mineral supplements as recommended by your health care provider.  What happens during an annual well check? The services and screenings done by your health care provider during your annual well check will depend on your age, overall health, lifestyle risk factors, and family history of disease. Counseling  Your health care provider may ask you questions about your:  Alcohol use.  Tobacco use.  Drug use.  Emotional well-being.  Home and relationship well-being.  Sexual activity.  Eating habits.  History of falls.  Memory and ability to understand (cognition).  Work and work Statistician.  Reproductive health. Screening  You may have the following tests or measurements:  Height, weight, and BMI.  Blood pressure.  Lipid and cholesterol levels. These may be checked every 5 years, or more frequently if you are over 57 years old.  Skin check.  Lung cancer screening. You may have this screening every year starting at age 88 if you have a 30-pack-year history of smoking and currently smoke or have quit within the past 15 years.  Fecal occult blood test (FOBT) of the stool. You may have this test every year starting at age 23.  Flexible sigmoidoscopy or colonoscopy. You may have a sigmoidoscopy every 5 years or a colonoscopy every 10 years starting at age 37.  Hepatitis C blood test.  Hepatitis B blood test.  Sexually transmitted disease (STD) testing.  Diabetes screening. This is done by checking your blood sugar (glucose) after you have not eaten for a while (fasting). You may have this done every 1-3 years.  Bone density scan. This is done to screen for osteoporosis. You may have this done starting at age 18.  Mammogram. This may be done every 1-2 years. Talk to  your health care provider about how often you should have regular mammograms. Talk with your health care provider about your test results, treatment options, and if necessary, the need for more tests. Vaccines  Your health care provider may recommend certain vaccines, such  as:  Influenza vaccine. This is recommended every year.  Tetanus, diphtheria, and acellular pertussis (Tdap, Td) vaccine. You may need a Td booster every 10 years.  Zoster vaccine. You may need this after age 8.  Pneumococcal 13-valent conjugate (PCV13) vaccine. One dose is recommended after age 65.  Pneumococcal polysaccharide (PPSV23) vaccine. One dose is recommended after age 45. Talk to your health care provider about which screenings and vaccines you need and how often you need them. This information is not intended to replace advice given to you by your health care provider. Make sure you discuss any questions you have with your health care provider. Document Released: 09/16/2015 Document Revised: 05/09/2016 Document Reviewed: 06/21/2015 Elsevier Interactive Patient Education  2017 [place] Prevention in the Home Falls can cause injuries. They can happen to people of all ages. There are many things you can do to make your home safe and to help prevent falls. What can I do on the outside of my home?  Regularly fix the edges of walkways and driveways and fix any cracks.  Remove anything that might make you trip as you walk through a door, such as a raised step or threshold.  Trim any bushes or trees on the path to your home.  Use bright outdoor lighting.  Clear any walking paths of anything that might make someone trip, such as rocks or tools.  Regularly check to see if handrails are loose or broken. Make sure that both sides of any steps have handrails.  Any raised decks and porches should have guardrails on the edges.  Have any leaves, snow, or ice cleared regularly.  Use sand or salt on walking paths during winter.  Clean up any spills in your garage right away. This includes oil or grease spills. What can I do in the bathroom?  Use night lights.  Install grab bars by the toilet and in the tub and shower. Do not use towel bars as grab bars.  Use  non-skid mats or decals in the tub or shower.  If you need to sit down in the shower, use a plastic, non-slip stool.  Keep the floor dry. Clean up any water that spills on the floor as soon as it happens.  Remove soap buildup in the tub or shower regularly.  Attach bath mats securely with double-sided non-slip rug tape.  Do not have throw rugs and other things on the floor that can make you trip. What can I do in the bedroom?  Use night lights.  Make sure that you have a light by your bed that is easy to reach.  Do not use any sheets or blankets that are too big for your bed. They should not hang down onto the floor.  Have a firm chair that has side arms. You can use this for support while you get dressed.  Do not have throw rugs and other things on the floor that can make you trip. What can I do in the kitchen?  Clean up any spills right away.  Avoid walking on wet floors.  Keep items that you use a lot in easy-to-reach places.  If you need to reach something above you, use a strong step stool that has  a grab bar.  Keep electrical cords out of the way.  Do not use floor polish or wax that makes floors slippery. If you must use wax, use non-skid floor wax.  Do not have throw rugs and other things on the floor that can make you trip. What can I do with my stairs?  Do not leave any items on the stairs.  Make sure that there are handrails on both sides of the stairs and use them. Fix handrails that are broken or loose. Make sure that handrails are as long as the stairways.  Check any carpeting to make sure that it is firmly attached to the stairs. Fix any carpet that is loose or worn.  Avoid having throw rugs at the top or bottom of the stairs. If you do have throw rugs, attach them to the floor with carpet tape.  Make sure that you have a light switch at the top of the stairs and the bottom of the stairs. If you do not have them, ask someone to add them for you. What  else can I do to help prevent falls?  Wear shoes that:  Do not have high heels.  Have rubber bottoms.  Are comfortable and fit you well.  Are closed at the toe. Do not wear sandals.  If you use a stepladder:  Make sure that it is fully opened. Do not climb a closed stepladder.  Make sure that both sides of the stepladder are locked into place.  Ask someone to hold it for you, if possible.  Clearly mark and make sure that you can see:  Any grab bars or handrails.  First and last steps.  Where the edge of each step is.  Use tools that help you move around (mobility aids) if they are needed. These include:  Canes.  Walkers.  Scooters.  Crutches.  Turn on the lights when you go into a dark area. Replace any light bulbs as soon as they burn out.  Set up your furniture so you have a clear path. Avoid moving your furniture around.  If any of your floors are uneven, fix them.  If there are any pets around you, be aware of where they are.  Review your medicines with your doctor. Some medicines can make you feel dizzy. This can increase your chance of falling. Ask your doctor what other things that you can do to help prevent falls. This information is not intended to replace advice given to you by your health care provider. Make sure you discuss any questions you have with your health care provider. Document Released: 06/16/2009 Document Revised: 01/26/2016 Document Reviewed: 09/24/2014 Elsevier Interactive Patient Education  2017 [employer].

## 2019-05-28 NOTE — Progress Notes (Signed)
Subjective:   [name] is a 85 y.o. female who presents for Medicare Annual (Subsequent) preventive examination.  Review of Systems:   Cardiac Risk Factors include: advanced age (>22men, >20 women);dyslipidemia;hypertension;smoking/ tobacco exposure     Objective:     Vitals: BP 130/64 (BP Location: Left Arm, Patient Position: Sitting, Cuff Size: Normal)   Pulse 71   Temp (!) 97.4 F (36.3 C) (Temporal)   Resp 15   Ht 5' (1.524 m)   Wt 95 lb 12.8 oz (43.5 kg)   BMI 18.71 kg/m   Body mass index is 18.71 kg/m.  Advanced Directives 05/28/2019 11/16/2018 06/22/2018 06/22/2018 05/19/2018  Does Patient Have a Medical Advance Directive? No No No Yes No  Type of Advance Directive - - - Living will -  Does patient want to make changes to medical advance directive? Yes (MAU/Ambulatory/Procedural Areas - Information given) - - - -  Would patient like information on creating a medical advance directive? - No - Patient declined No - Patient declined - Yes (MAU/Ambulatory/Procedural Areas - Information given)    Tobacco Social History   Tobacco Use  Smoking Status Former Smoker  . Years: 25.00  . Types: Cigarettes  . Quit date: 02/01/1986  . Years since quitting: 33.3  Smokeless Tobacco Never Used  Tobacco Comment   quit in 1987      Counseling given: Not Answered Comment: quit in 1987    Clinical Intake:  Pre-visit preparation completed: Yes  Pain : No/denies pain     Nutritional Risks: None Diabetes: No  How often do you need to have someone help you when you read instructions, pamphlets, or other written materials from your doctor or pharmacy?: 1 - Never  Interpreter Needed?: No  Information entered by :: Jakayden Cancio,LPN  Past Medical History:  Diagnosis Date  . Anxiety   . Atrial fibrillation ([place])   . Clotting disorder (H[facility])    left ankle  . COPD (chronic obstructive pulmonary disease) ([place])   . Depression   . Hypertension   . Melena 06/22/2018   . Obstructive chronic bronchitis without exacerbation ([place]) 12/18/2016  . Overactive bladder   . Thyroid disease    Past Surgical History:  Procedure Laterality Date  . ABDOMINAL HYSTERECTOMY    . BREAST LUMPECTOMY    . COLONOSCOPY Left 06/23/2018   Procedure: COLONOSCOPY;  Surgeon: Jonathon Bellows, MD;  Location: [facility] ENDOSCOPY;  Service: Gastroenterology;  Laterality: Left;  Colonoscopy first. Dr. Vicente Males will decide if EGD is needed after colonoscopy  . ESOPHAGOGASTRODUODENOSCOPY Left 06/23/2018   Procedure: ESOPHAGOGASTRODUODENOSCOPY (EGD);  Surgeon: Jonathon Bellows, MD;  Location: [facility] ENDOSCOPY;  Service: Gastroenterology;  Laterality: Left;  Colonoscopy first. Dr. Vicente Males will decide if EGD is needed after colonoscopy  . LAPAROSCOPIC RIGHT COLECTOMY Right 06/26/2018   Procedure: LAPAROSCOPIC RIGHT COLECTOMY;  Surgeon: Olean Ree, MD;  Location: [facility] ORS;  Service: General;  Laterality: Right;   Family History  Problem Relation Age of Onset  . Heart disease Mother   . Heart failure Mother   . Parkinson's disease Father   . Heart disease Sister   . Heart disease Son    Social History   Socioeconomic History  . Marital status: Divorced    Spouse name: Not on file  . Number of children: Not on file  . Years of education: Not on file  . Highest education level: High school graduate  Occupational History  . Occupation: retired  Scientific laboratory technician  . Financial resource strain: Somewhat  hard  . Food insecurity    Worry: Never true    Inability: Never true  . Transportation needs    Medical: No    Non-medical: No  Tobacco Use  . Smoking status: Former Smoker    Years: 25.00    Types: Cigarettes    Quit date: 02/01/1986    Years since quitting: 33.3  . Smokeless tobacco: Never Used  . Tobacco comment: quit in 1987   Substance and Sexual Activity  . Alcohol use: Never    Frequency: Never  . Drug use: Never  . Sexual activity: Not Currently  Lifestyle  . Physical activity    Days per  week: 0 days    Minutes per session: 0 min  . Stress: Not at all  Relationships  . Social connections    Talks on phone: More than three times a week    Gets together: More than three times a week    Attends religious service: Never    Active member of club or organization: No    Attends meetings of clubs or organizations: Never    Relationship status: Divorced  Other Topics Concern  . Not on file  Social History Narrative   Has 3 daughters, 2 local     Outpatient Encounter Medications as of 05/28/2019  Medication Sig  . acetaminophen (TYLENOL) 325 MG tablet Take 2 tablets (650 mg total) by mouth every 6 (six) hours as needed for mild pain or fever.  Marland Kitchen amLODipine (NORVASC) 2.5 MG tablet Take 1 tablet (2.5 mg total) by mouth daily.  Marland Kitchen aspirin EC 81 MG tablet Take 1 tablet (81 mg total) by mouth daily.  . feeding supplement, ENSURE ENLIVE, (ENSURE ENLIVE) LIQD Take 237 mLs by mouth 2 (two) times daily between meals.  . fluticasone (FLONASE) 50 MCG/ACT nasal spray Place 2 sprays into both nostrils daily.  . Fluticasone-Salmeterol (ADVAIR DISKUS) 250-50 MCG/DOSE AEPB Inhale 1 puff into the lungs daily. Rinse mouth after use.  . latanoprost (XALATAN) 0.005 % ophthalmic solution INSTILL 1 DROP INTO EACH EYE AT BEDTIME  . levothyroxine (SYNTHROID, LEVOTHROID) 88 MCG tablet TAKE 1 TABLET BY MOUTH ONCE DAILY BEFORE BREAKFAST  . metoprolol tartrate (LOPRESSOR) 50 MG tablet Take 1 tablet (50 mg total) by mouth 2 (two) times daily.  . multivitamin-lutein (OCUVITE-LUTEIN) CAPS capsule Take 1 capsule by mouth daily.  . OXYGEN Inhale 2 L into the lungs. As needed during the day, and full use at bedtime  . solifenacin (VESICARE) 10 MG tablet Take 1 tablet by mouth once daily  . ferrous sulfate 325 (65 FE) MG tablet Take 1 tablet (325 mg total) by mouth 3 (three) times daily with meals. (Patient not taking: Reported on 05/28/2019)   No facility-administered encounter medications on file as of  05/28/2019.     Activities of Daily Living In your present state of health, do you have any difficulty performing the following activities: 05/28/2019 06/22/2018  Hearing? Y -  Comment deaf in left ear, hearing aid right ear -  Vision? N -  Comment reading glasses, goes to eye dr -  Difficulty concentrating or making decisions? Y -  Walking or climbing stairs? Y -  Comment avoids stairs -  Dressing or bathing? N -  Doing errands, shopping? N N  Preparing Food and eating ? N -  Using the Toilet? N -  In the past six months, have you accidently leaked urine? Y -  Comment pads for protection -  Do you have  problems with loss of bowel control? N -  Managing your Medications? N -  Managing your Finances? N -  Housekeeping or managing your Housekeeping? N -  Some recent data might be hidden    Patient Care Team: Valerie Roys, DO as PCP - General (Family Medicine) Laverle Hobby, MD as Consulting Physician (Pulmonary Disease) Minna Merritts, MD as Consulting Physician (Cardiology) Clent Jacks, RN as Registered Nurse [place], Dalbert Garnet, RN as [place] [facility] Minor, Dalbert Garnet, RN as [place] [facility]    Assessment:   This is a routine wellness examination for [facility].  Exercise Activities and Dietary recommendations Current Exercise Habits: The patient does not participate in regular exercise at present, Exercise limited by: None identified  Goals    . DIET - INCREASE WATER INTAKE     Recommend drinking at least 6-8 glasses of water a day        Fall Risk: Fall Risk  05/28/2019 02/06/2019 02/06/2019 12/08/2018 07/04/2018  Falls in the past year? 1 0 0 0 1  Number falls in past yr: 0 - - 0 -  Injury with Fall? 1 - - 0 -  Risk Factor Category  - - - - -  Risk for fall due to : - - - Impaired balance/gait;Impaired mobility -    FALL RISK PREVENTION PERTAINING TO THE HOME:  Any stairs in or around the home? Yes  If so,  are there any without handrails? No   Home free of loose throw rugs in walkways, pet beds, electrical cords, etc? Yes  Adequate lighting in your home to reduce risk of falls? Yes   ASSISTIVE DEVICES UTILIZED TO PREVENT FALLS:  Life alert? Yes  Use of a cane, walker or w/c? Yes  if needed Grab bars in the bathroom? Yes  Shower chair or bench in shower? Yes  Elevated toilet seat or a handicapped toilet? No   DME ORDERS:  DME order needed?  No   TIMED UP AND GO:  Was the test performed? Yes .  Length of time to ambulate 10 feet: 11 sec.   GAIT:  Appearance of gait: Gait steady and fast without the use of an assistive device.  Education: Fall risk prevention has been discussed.  Intervention(s) required? No   DME/home health order needed?  No    Depression Screen PHQ 2/9 Scores 05/28/2019 12/08/2018 05/19/2018  PHQ - 2 Score 1 0 0  PHQ- 9 Score - 0 -     Cognitive Function     6CIT Screen 05/28/2019 05/19/2018  What Year? 0 points 0 points  What month? 0 points 0 points  What time? 0 points 0 points  Count back from 20 0 points 0 points  Months in reverse 0 points 0 points  Repeat phrase 0 points 0 points  Total Score 0 0    Immunization History  Administered Date(s) Administered  . Fluad Quad(high Dose 65+) 05/28/2019  . Influenza Split 06/15/2010, 05/24/2011, 06/20/2012, 07/01/2013, 05/27/2014  . Influenza, High Dose Seasonal PF 05/18/2016, 06/19/2017, 05/16/2018  . Influenza, Seasonal, Injecte, Preservative Fre 06/20/2012  . Influenza,inj,Quad PF,6+ Mos 05/10/2015  . Influenza,inj,quad, With Preservative 06/15/2010, 05/24/2011, 07/01/2013, 05/27/2014  . Pneumococcal Conjugate-13 05/27/2014  . Pneumococcal Polysaccharide-23 05/24/2011, 06/13/2015  . Td 02/07/2018  . Tdap 02/10/2016    Qualifies for Shingles Vaccine? Yes  Zostavax completed n/a. Due for Shingrix. Education has been provided regarding the importance of this vaccine. Pt has been advised  to call  insurance company to determine out of pocket expense. Advised may also receive vaccine at local pharmacy or Health Dept. Verbalized acceptance and understanding.  Tdap: up to date .  Flu Vaccine: Due for Flu vaccine. Does the patient want to receive this vaccine today?  Yes .   Pneumococcal Vaccine: up to date   Screening Tests Health Maintenance  Topic Date Due  . INFLUENZA VACCINE  04/04/2019  . TETANUS/TDAP  02/08/2028  . PNA vac Low Risk Adult  Completed  . DEXA SCAN  Discontinued    Cancer Screenings:  Colorectal Screening: scheduled next month due to history of colon cancer   Mammogram: no longer required   Bone Density:   Lung Cancer Screening: (Low Dose CT Chest recommended if Age 72-80 years, 30 pack-year currently smoking OR have quit w/in 15years.) does not qualify.   Additional Screening:  Hepatitis C Screening: does not qualify  Vision Screening: Recommended annual ophthalmology exams for early detection of glaucoma and other disorders of the eye. Is the patient up to date with their annual eye exam?  Yes  Who is the provider or what is the name of the office in which the pt attends annual eye exams? [facility]    Dental Screening: Recommended annual dental exams for proper oral hygiene  Community Resource Referral:  CRR required this visit?  No    Provided patient with ensure samples and coupons. Sent referral to CCM team, appears they have attempted to contact her in the past and unable to reach her- she is aware they are going to contact her now and she has their direct contact numbers as well. Will send email to [employer] for ensure samples for patient.      Plan:  I have personally reviewed and addressed the Medicare Annual Wellness questionnaire and have noted the following in the patient's chart:  A. Medical and social history B. Use of alcohol, tobacco or illicit drugs  C. Current medications and supplements D. Functional ability and  status E.  Nutritional status F.  Physical activity G. Advance directives H. List of other physicians I.  Hospitalizations, surgeries, and ER visits in previous 12 months J.  [place] such as hearing and vision if needed, cognitive and depression L. Referrals and appointments   In addition, I have reviewed and discussed with patient certain preventive protocols, quality metrics, and best practice recommendations. A written personalized care plan for preventive services as well as general preventive health recommendations were provided to patient.  Signed,    Bevelyn Ngo, LPN  [id number] Nurse Health Advisor   Nurse Notes: discussed CCM team with patient, looks like they have tried contacting her in the past. Informed her of the purpose of this team and they would contact her via phone to discuss more. Patient agrees and provided her with contact information for Janci,RN as well.

## 2019-06-01 DIAGNOSIS — H401131 Primary open-angle glaucoma, bilateral, mild stage: Secondary | ICD-10-CM | POA: Diagnosis not present

## 2019-06-02 ENCOUNTER — Encounter: Payer: Medicare Other | Admitting: Physician Assistant

## 2019-06-02 ENCOUNTER — Other Ambulatory Visit: Payer: Self-pay

## 2019-06-02 DIAGNOSIS — I4891 Unspecified atrial fibrillation: Secondary | ICD-10-CM | POA: Diagnosis not present

## 2019-06-02 DIAGNOSIS — S51011A Laceration without foreign body of right elbow, initial encounter: Secondary | ICD-10-CM | POA: Diagnosis not present

## 2019-06-02 DIAGNOSIS — J449 Chronic obstructive pulmonary disease, unspecified: Secondary | ICD-10-CM | POA: Diagnosis not present

## 2019-06-02 DIAGNOSIS — S81802A Unspecified open wound, left lower leg, initial encounter: Secondary | ICD-10-CM | POA: Diagnosis not present

## 2019-06-02 DIAGNOSIS — L97812 Non-pressure chronic ulcer of other part of right lower leg with fat layer exposed: Secondary | ICD-10-CM | POA: Diagnosis not present

## 2019-06-02 DIAGNOSIS — I10 Essential (primary) hypertension: Secondary | ICD-10-CM | POA: Diagnosis not present

## 2019-06-02 DIAGNOSIS — S51811A Laceration without foreign body of right forearm, initial encounter: Secondary | ICD-10-CM | POA: Diagnosis not present

## 2019-06-02 DIAGNOSIS — Z87891 Personal history of nicotine dependence: Secondary | ICD-10-CM | POA: Diagnosis not present

## 2019-06-02 DIAGNOSIS — I872 Venous insufficiency (chronic) (peripheral): Secondary | ICD-10-CM | POA: Diagnosis not present

## 2019-06-02 DIAGNOSIS — L98492 Non-pressure chronic ulcer of skin of other sites with fat layer exposed: Secondary | ICD-10-CM | POA: Diagnosis not present

## 2019-06-02 NOTE — Progress Notes (Addendum)
TASNIM, PHANOR ([id number]) Visit Report for 06/02/2019 Chief Complaint Document Details Patient Name: [name], [name]. Date of Service: 06/02/2019 1:00 PM Medical Record Number: [id number] Patient Account Number: [id number] Date of Birth/Sex: Jan 03, 1928 (85 y.o. F) Treating RN: Montey Hora Primary Care Provider: Park Liter Other Clinician: Referring Provider: Park Liter Treating Provider/Extender: [name], [name] Weeks in Treatment: 12 Information Obtained from: Patient Chief Complaint Right elbow skin tear Electronic Signature(s) Signed: 06/02/2019 1:01:51 PM By: Worthy Keeler PA-C Entered By: Worthy Keeler on 06/02/2019 13:01:51 Garfinkel, Dorsie H. ([id number]) -------------------------------------------------------------------------------- HPI Details Patient Name: [name]. Date of Service: 06/02/2019 1:00 PM Medical Record Number: [id number] Patient Account Number: [id number] Date of Birth/Sex: 01/09/1928 (85 y.o. F) Treating RN: Montey Hora Primary Care Provider: Park Liter Other Clinician: Referring Provider: Park Liter Treating Provider/Extender: [name], [name] Weeks in Treatment: 12 History of Present Illness HPI Description: 02/12/18 ADMISSION This is an 85 year old woman who is recently moved to [place] from [place]. Her story began in February where she fell on some ice suffering extensive lacerations to her bilateral lower extremities. She is able to show me extensive pictures of the right lower leg but with going to a wound care center and [place] 3 times a week these eventually closed over. She has been left with 1 area on the upper lateral left calf. She has been applying Neosporin to this and applying a bandage. Currently this measures 2 x 1.5 cm. The patient is not a diabetic. She is an ex-smoker quitting 40 years ago. She does have COPD by description. ABIs in our clinic were 1.32 on the right and 1.03 on the  left 02/19/18; right lower leg wound which was initially trauma. The area that we look that last week is smaller. Still covered in a nonviable surface however. We are using Iodoflex 02/26/18; right lower leg wound which was initially trauma in the setting of chronic venous insufficiency. Surface of the wound looks much better healthy granulation advancing epithelialization. We have good edema control 03/05/18; right lower leg wound which was initially trauma in the setting of chronic venous insufficiency. She continues to make nice progress here. We have good edema control oHe arrives today with a new traumatic laceration on her right dorsal forearm which she states happened while she was putting on a sweatshirt 03/12/18; right lower leg wound as closed however it still looks vulnerable. She has chronic venous insufficiency. oThe new wound from last week a traumatic area on her right dorsal or arm unfortunately does not have a viable surface. I remove some nonviable skin and necrotic subcutaneous debris from the wound surface. Hemostasis with silver nitrate and direct pressure 03/19/18; right lower leg wound is closed. She has chronic venous insufficiency and will need ongoing compression oThe new wounds from 2 weeks ago on the right upper elbow is closed. The area on her right dorsal forearm continues to have a nonviable surface requiring debridement 03/26/18; we'll need to look into ongoing compression for her legs. 2 wounds on the right upper elbow is closed. The area on the right dorsal forearm looks better. Hydrofera Blue secondary to hypertrophic granulation 04/02/18; he has compression stockings although she did not wear them today. Severe chronic venous insufficiency. She has 2 wounds on the right upper elbow which I said were closed last week which actually are although they're very small and she has a smaller clean wound on the right dorsal forearm. We've been using Hydrofera Blue secondary to  some  upper granulation. All of this looks better 04/09/18-She is seen in follow up evaluation for a right elbow and right forearm skin tear. The right elbow is healed. We will continue with same treatment plan and she'll follow next week 04/16/18-She is seen in follow-up evaluation for right forearm skin tear, this is essentially healed. We will cover with foam border and she will follow-up next week 04/23/18; the patient arrives with a new skin tear on her right dorsal arm just below the elbow. She got this while carrying a box. She has a linear skin tear. There is no depth I don't think this should've been sutured. There is a skin flap medially I'm not sure if this will be viable or not 04/30/18; the new skin tear from last week unfortunately has remained viable in terms of the skin flap. She has a small open area that looks healthy. Using moistened silver collagen 05/07/18; right dorsal arm skin tear. Nonviable tissue over the remnants of the wound. We've been using silver collagen 05/21/18; right dorsal arm skin tear. Nonviable tissue over a small remnant of the wound was washed off. We've been using silver collagen which we will continue 06/04/18; the right dorsal arm skin tear has healed. She arrives today with a traumatic wound just above the olecranon of her left elbow. This is a skin tear with a nonviable nonadherent flap which was removed. We'll use silver collagen here. Also noteworthy she arrives without her compression stockings 06/18/18; the area just above the olecranon of her left elbow. It is smaller and generally has a healthy surface. I was surprised [name], [name] ([id number]) to learn that the patient is actually changing this herself although. Appears this week she was putting some topical lidocaine on this that she bought over-the-counter at the drugstore. She did get supplies at home she didn't know how to put it on. She drove herself here today i.e. not eligible for home  health Readmission: 11/04/18 on evaluation today patient presents for evaluation our clinic news to include the she has on her lower extremity which occurred as the result of having hit this on a piece of furniture. This was back in December. She states that her daughter has been trying to get her to come into have this evaluated here the wound care center but she was being somewhat stubborn. Nonetheless upon evaluation today the wound really appears to be potentially healed she does have some swelling of lower extremity which I think will come into play. With that being said he tells me that even yesterday this was still making clear fluid and therefore there may be some issues with continued problems with the swelling and drainage secondary to her venous stasis. Nonetheless overall wound appears to be doing fairly well which is good news. 11/11/18 on evaluation today patient actually appears to be doing excellent in the area of question that was green just the day before he saw her last time actually appears to have not drain that always in past week. I think this is done very well and at this point though she's had some discomfort I do not see any evidence of open wound which he needs to continue see wound care. Readmission: 03/10/19 on evaluation today patient presents for reevaluation here in our clinic concerning issues that she actually is having with the new problem on her right lower leg due to trauma that occurred when a car door shut on her leg last Thursday. This is not been quite a  week and she does have several skin tears unfortunately that resulted from this injury. Fortunately there's no signs of active infection at this time. No fevers, chills, nausea, or vomiting noted at this time. She does have some hot tissue noted at this point that has me concerned about the possibility of needing to move this in order to allow the areas to heal appropriately. Fortunately there's no signs of  active infection at this time. 03/17/19-Patient returns to clinic at 1 week, we have been using Prisma on the larger wounds with Kerlix and Xeroform. As many as 5 wounds were number last time 3 of which have closed, leaving the right proximal tibial and right distal medial leg wound both of which are looking good 03/24/19 on evaluation today patient appears to be doing better in regard to her wounds at this time. Fortunately there is no sign of active infection at this time. No fevers chills noted. She's been tolerating the dressing changes without complication which is good news she's been performing these herself it sounds like home health is not going to be coming out it sounds as if they feel like she may be able to take care of yourself and not be technically homebound. Nonetheless though that is unfortunate it does seem like she's done fairly well with taking care of yourself from the standpoint of the dressings. 03/31/19 on evaluation today patient actually appears to be doing well with regard to her lower extremity ulcers. She had one area to heal today and in regard to her right lower extremity were she continues to have an open wound I did have to perform some sharp debridement today. Subsequently the patient still has an area of what appears to be possibly a small hematoma on the anterior portion of her left lower extremity. This is not open enough at this time but nonetheless does seem to show some evidence of hardening which is not necessarily a bad thing. Eventually this may pop off but again I'm not interested in really removing this prematurely. 04/07/19 on evaluation today patient actually appears to be doing well with regard to her lower extremity ulcers. She is been tolerating the dressing changes without complication. She seems to be making excellent progress which is great news. She does have one small new skin tear area but the good news is she was able to pull the skin back over  and it really looks like it is healing quite nicely. Overall that is just a very small region and again seems to be healing nicely. 04/14/19 upon evaluation today patient actually appears to be doing quite well with regard to the wounds on the right lower extremity. In fact everything appears to be healed as of today. This is excellent news. She does however have the area on the left lower extremity which is showing some signs of loosening up we have been watching this area it is no longer fluctuant and in fact with the Betadine on it it is been slowly dry now but is now lifting up on the edge. There may be a wound underneath this region. 04/21/2019 on evaluation today patient appears to be doing excellent in regard to her bilateral lower extremities. In fact she has been tolerating the dressing changes without complication does not seem to show any signs of infection at this point. In fact based on what I am seeing currently I think she is completely healed. LILIKA, TRIMNAL ([id number]) 05/04/2019 on evaluation today patient actually appears to be  doing quite well with regard to her right elbow. She has been tolerating the dressing changes without complication. Fortunately there is no signs of active infection at this time. Overall I am very pleased with how things are progressing. The elbow almost appears to be completely healed though it is not 100% today. 05/14/2019 on evaluation today patient actually appears to be doing well with regard to her elbow skin tear that we have been treating in fact this appears to be completely healed. Unfortunately she has a new skin tear noted as of today which actually occurred in the last week since I previously saw her. This again is unfortunate that it has been so long because I am not going to be able to actually pull the skin over where you have to trim this away and let it heal by second intent. This new wound is in the right elbow region as well but  again separate from the original ulcer. 05/19/2019 on evaluation today patient appears to be doing well with regard to her skin tear on her elbow. She has been tolerating the dressing changes without complication. Fortunately there is no signs of active infection at this time. No fever chills noted. Overall I am very pleased with how things appear today. She is not having any significant pain which is also excellent news. 05/26/2019 on evaluation today patient actually appears to be doing well with regard to her right elbow ulcer. She has been tolerating the dressing changes without complication. Fortunately there is no signs of active infection at this time. No fevers, chills, nausea, vomiting, or diarrhea. 06/02/2019 on evaluation today patient appears to be doing very well with regard to her wound of the right elbow. In fact there is just a very small opening still remaining for the most part this is completely closed and appears to be doing excellent. There is no signs of active infection at this time which is also good news. No fevers, chills, nausea, vomiting, or diarrhea. Electronic Signature(s) Signed: 06/02/2019 1:15:50 PM By: Worthy Keeler PA-C Entered By: Worthy Keeler on 06/02/2019 13:15:50 Ramey, [name] ([id number]) -------------------------------------------------------------------------------- Physical Exam Details Patient Name: Lachapelle, Yan H. Date of Service: 06/02/2019 1:00 PM Medical Record Number: [id number] Patient Account Number: [id number] Date of Birth/Sex: Jan 27, 1928 (85 y.o. F) Treating RN: Montey Hora Primary Care Provider: Park Liter Other Clinician: Referring Provider: Park Liter Treating Provider/Extender: [name], [name] Weeks in Treatment: 72 Constitutional Well-nourished and well-hydrated in no acute distress. Respiratory normal breathing without difficulty. clear to auscultation bilaterally. Cardiovascular regular rate and rhythm  with normal S1, S2. Psychiatric this patient is able to make decisions and demonstrates good insight into disease process. Alert and Oriented x 3. pleasant and cooperative. Notes Patient's wound bed currently again shows excellent signs of new epithelization there is just a very small area that was still open and draining and again this is minimal at this point. Overall I am very pleased with how things have progressed and I am hopeful this will continue to do very well for her. In fact I am expecting this to probably be completely closed as of next week. Do need to protected in the interim Electronic Signature(s) Signed: 06/02/2019 1:16:21 PM By: Worthy Keeler PA-C Entered By: Worthy Keeler on 06/02/2019 13:16:21 Scarbro, [name] ([id number]) -------------------------------------------------------------------------------- Physician Orders Details Patient Name: [name]. Date of Service: 06/02/2019 1:00 PM Medical Record Number: [id number] Patient Account Number: [id number] Date of Birth/Sex: 07/29/1928 (85 y.o. F) Treating  RN: Montey Hora Primary Care Provider: Park Liter Other Clinician: Referring Provider: Park Liter Treating Provider/Extender: [name], [name] Weeks in Treatment: 84 Verbal / Phone Orders: No Diagnosis Coding ICD-10 Coding Code Description I87.2 Venous insufficiency (chronic) (peripheral) S51.001A Unspecified open wound of right elbow, initial encounter Wound Cleansing Wound #16 Right Elbow o Clean wound with Normal Saline. Secondary Dressing Wound #16 Right Elbow o ABD pad o Conform/Kerlix o Other - stetch net Dressing Change Frequency Wound #16 Right Elbow o Change dressing every other day. Follow-up Appointments Wound #16 Right Elbow o Return Appointment in 1 week. Electronic Signature(s) Signed: 06/02/2019 4:22:21 PM By: Montey Hora Signed: 06/03/2019 9:08:00 AM By: Worthy Keeler PA-C Entered By: Montey Hora on 06/02/2019 13:15:08 Guardino, Baleigh Lemmie Evens ([id number]) -------------------------------------------------------------------------------- Problem List Details Patient Name: Legere, Judith H. Date of Service: 06/02/2019 1:00 PM Medical Record Number: [id number] Patient Account Number: [id number] Date of Birth/Sex: [birth year] (85 y.o. F) Treating RN: Montey Hora Primary Care Provider: Park Liter Other Clinician: Referring Provider: Park Liter Treating Provider/Extender: [name], [name] Weeks in Treatment: 12 Active Problems ICD-10 Evaluated Encounter Code Description Active Date Today Diagnosis I87.2 Venous insufficiency (chronic) (peripheral) 03/10/2019 No Yes S51.001A Unspecified open wound of right elbow, initial encounter 04/28/2019 No Yes Inactive Problems Resolved Problems ICD-10 Code Description Active Date Resolved Date L97.812 Non-pressure chronic ulcer of other part of right lower leg with fat 03/10/2019 03/10/2019 layer exposed Electronic Signature(s) Signed: 06/02/2019 1:01:42 PM By: Worthy Keeler PA-C Entered By: Worthy Keeler on 06/02/2019 13:01:42 Leblond, Chanae H. ([id number]) -------------------------------------------------------------------------------- Progress Note Details Patient Name: Mozingo, Paulyne H. Date of Service: 06/02/2019 1:00 PM Medical Record Number: [id number] Patient Account Number: [id number] Date of Birth/Sex: 06/15/1928 (85 y.o. F) Treating RN: Montey Hora Primary Care Provider: Park Liter Other Clinician: Referring Provider: Park Liter Treating Provider/Extender: [name], [name] Weeks in Treatment: 12 Subjective Chief Complaint Information obtained from Patient Right elbow skin tear History of Present Illness (HPI) 02/12/18 ADMISSION This is an 85 year old woman who is recently moved to [place] from [place]. Her story began in February where she fell on some ice suffering extensive lacerations to  her bilateral lower extremities. She is able to show me extensive pictures of the right lower leg but with going to a wound care center and [place] 3 times a week these eventually closed over. She has been left with 1 area on the upper lateral left calf. She has been applying Neosporin to this and applying a bandage. Currently this measures 2 x 1.5 cm. The patient is not a diabetic. She is an ex-smoker quitting 40 years ago. She does have COPD by description. ABIs in our clinic were 1.32 on the right and 1.03 on the left 02/19/18; right lower leg wound which was initially trauma. The area that we look that last week is smaller. Still covered in a nonviable surface however. We are using Iodoflex 02/26/18; right lower leg wound which was initially trauma in the setting of chronic venous insufficiency. Surface of the wound looks much better healthy granulation advancing epithelialization. We have good edema control 03/05/18; right lower leg wound which was initially trauma in the setting of chronic venous insufficiency. She continues to make nice progress here. We have good edema control He arrives today with a new traumatic laceration on her right dorsal forearm which she states happened while she was putting on a sweatshirt 03/12/18; right lower leg wound as closed however it still looks vulnerable. She has chronic venous insufficiency.  The new wound from last week a traumatic area on her right dorsal or arm unfortunately does not have a viable surface. I remove some nonviable skin and necrotic subcutaneous debris from the wound surface. Hemostasis with silver nitrate and direct pressure 03/19/18; right lower leg wound is closed. She has chronic venous insufficiency and will need ongoing compression The new wounds from 2 weeks ago on the right upper elbow is closed. The area on her right dorsal forearm continues to have a nonviable surface requiring debridement 03/26/18; we'll need to look into ongoing  compression for her legs. 2 wounds on the right upper elbow is closed. The area on the right dorsal forearm looks better. Hydrofera Blue secondary to hypertrophic granulation 04/02/18; he has compression stockings although she did not wear them today. Severe chronic venous insufficiency. She has 2 wounds on the right upper elbow which I said were closed last week which actually are although they're very small and she has a smaller clean wound on the right dorsal forearm. We've been using Hydrofera Blue secondary to some upper granulation. All of this looks better 04/09/18-She is seen in follow up evaluation for a right elbow and right forearm skin tear. The right elbow is healed. We will continue with same treatment plan and she'll follow next week 04/16/18-She is seen in follow-up evaluation for right forearm skin tear, this is essentially healed. We will cover with foam border and she will follow-up next week 04/23/18; the patient arrives with a new skin tear on her right dorsal arm just below the elbow. She got this while carrying a box. She has a linear skin tear. There is no depth I don't think this should've been sutured. There is a skin flap medially I'm not sure if this will be viable or not 04/30/18; the new skin tear from last week unfortunately has remained viable in terms of the skin flap. She has a small open area that looks healthy. Using moistened silver collagen 05/07/18; right dorsal arm skin tear. Nonviable tissue over the remnants of the wound. We've been using silver collagen JOCELYNNE, SHAMBACH ([id number]) 05/21/18; right dorsal arm skin tear. Nonviable tissue over a small remnant of the wound was washed off. We've been using silver collagen which we will continue 06/04/18; the right dorsal arm skin tear has healed. She arrives today with a traumatic wound just above the olecranon of her left elbow. This is a skin tear with a nonviable nonadherent flap which was removed. We'll use  silver collagen here. Also noteworthy she arrives without her compression stockings 06/18/18; the area just above the olecranon of her left elbow. It is smaller and generally has a healthy surface. I was surprised to learn that the patient is actually changing this herself although. Appears this week she was putting some topical lidocaine on this that she bought over-the-counter at the drugstore. She did get supplies at home she didn't know how to put it on. She drove herself here today i.e. not eligible for home health Readmission: 11/04/18 on evaluation today patient presents for evaluation our clinic news to include the she has on her lower extremity which occurred as the result of having hit this on a piece of furniture. This was back in December. She states that her daughter has been trying to get her to come into have this evaluated here the wound care center but she was being somewhat stubborn. Nonetheless upon evaluation today the wound really appears to be potentially healed she does have  some swelling of lower extremity which I think will come into play. With that being said he tells me that even yesterday this was still making clear fluid and therefore there may be some issues with continued problems with the swelling and drainage secondary to her venous stasis. Nonetheless overall wound appears to be doing fairly well which is good news. 11/11/18 on evaluation today patient actually appears to be doing excellent in the area of question that was green just the day before he saw her last time actually appears to have not drain that always in past week. I think this is done very well and at this point though she's had some discomfort I do not see any evidence of open wound which he needs to continue see wound care. Readmission: 03/10/19 on evaluation today patient presents for reevaluation here in our clinic concerning issues that she actually is having with the new problem on her right lower  leg due to trauma that occurred when a car door shut on her leg last Thursday. This is not been quite a week and she does have several skin tears unfortunately that resulted from this injury. Fortunately there's no signs of active infection at this time. No fevers, chills, nausea, or vomiting noted at this time. She does have some hot tissue noted at this point that has me concerned about the possibility of needing to move this in order to allow the areas to heal appropriately. Fortunately there's no signs of active infection at this time. 03/17/19-Patient returns to clinic at 1 week, we have been using Prisma on the larger wounds with Kerlix and Xeroform. As many as 5 wounds were number last time 3 of which have closed, leaving the right proximal tibial and right distal medial leg wound both of which are looking good 03/24/19 on evaluation today patient appears to be doing better in regard to her wounds at this time. Fortunately there is no sign of active infection at this time. No fevers chills noted. She's been tolerating the dressing changes without complication which is good news she's been performing these herself it sounds like home health is not going to be coming out it sounds as if they feel like she may be able to take care of yourself and not be technically homebound. Nonetheless though that is unfortunate it does seem like she's done fairly well with taking care of yourself from the standpoint of the dressings. 03/31/19 on evaluation today patient actually appears to be doing well with regard to her lower extremity ulcers. She had one area to heal today and in regard to her right lower extremity were she continues to have an open wound I did have to perform some sharp debridement today. Subsequently the patient still has an area of what appears to be possibly a small hematoma on the anterior portion of her left lower extremity. This is not open enough at this time but nonetheless does seem  to show some evidence of hardening which is not necessarily a bad thing. Eventually this may pop off but again I'm not interested in really removing this prematurely. 04/07/19 on evaluation today patient actually appears to be doing well with regard to her lower extremity ulcers. She is been tolerating the dressing changes without complication. She seems to be making excellent progress which is great news. She does have one small new skin tear area but the good news is she was able to pull the skin back over and it really looks like it  is healing quite nicely. Overall that is just a very small region and again seems to be healing nicely. 04/14/19 upon evaluation today patient actually appears to be doing quite well with regard to the wounds on the right lower extremity. In fact everything appears to be healed as of today. This is excellent news. She does however have the area on the left lower extremity which is showing some signs of loosening up we have been watching this area it is no longer fluctuant and in fact with the Betadine on it it is been slowly dry now but is now lifting up on the edge. There may be a wound underneath [place], [place] ([id number]) this region. 04/21/2019 on evaluation today patient appears to be doing excellent in regard to her bilateral lower extremities. In fact she has been tolerating the dressing changes without complication does not seem to show any signs of infection at this point. In fact based on what I am seeing currently I think she is completely healed. 05/04/2019 on evaluation today patient actually appears to be doing quite well with regard to her right elbow. She has been tolerating the dressing changes without complication. Fortunately there is no signs of active infection at this time. Overall I am very pleased with how things are progressing. The elbow almost appears to be completely healed though it is not 100% today. 05/14/2019 on evaluation today  patient actually appears to be doing well with regard to her elbow skin tear that we have been treating in fact this appears to be completely healed. Unfortunately she has a new skin tear noted as of today which actually occurred in the last week since I previously saw her. This again is unfortunate that it has been so long because I am not going to be able to actually pull the skin over where you have to trim this away and let it heal by second intent. This new wound is in the right elbow region as well but again separate from the original ulcer. 05/19/2019 on evaluation today patient appears to be doing well with regard to her skin tear on her elbow. She has been tolerating the dressing changes without complication. Fortunately there is no signs of active infection at this time. No fever chills noted. Overall I am very pleased with how things appear today. She is not having any significant pain which is also excellent news. 05/26/2019 on evaluation today patient actually appears to be doing well with regard to her right elbow ulcer. She has been tolerating the dressing changes without complication. Fortunately there is no signs of active infection at this time. No fevers, chills, nausea, vomiting, or diarrhea. 06/02/2019 on evaluation today patient appears to be doing very well with regard to her wound of the right elbow. In fact there is just a very small opening still remaining for the most part this is completely closed and appears to be doing excellent. There is no signs of active infection at this time which is also good news. No fevers, chills, nausea, vomiting, or diarrhea. Patient History Information obtained from Patient. Family History Cancer - Siblings,Paternal Grandparents, Diabetes - Siblings, Heart Disease - Mother,Father,Child,Paternal Grandparents, Hypertension - Child, No family history of Hereditary Spherocytosis, Kidney Disease, Lung Disease, Seizures, Stroke, Thyroid  Problems, Tuberculosis. Social History Former smoker - quit 40 yrs ago, Marital Status - Divorced, Alcohol Use - Never, Drug Use - No History, Caffeine Use - Rarely. Medical History Eyes Patient has history of Cataracts - surgery 2000,  Glaucoma Respiratory Patient has history of Asthma, Chronic Obstructive Pulmonary Disease (COPD) Denies history of Pneumothorax, Sleep Apnea, Tuberculosis Cardiovascular Patient has history of Arrhythmia - a-fib, Hypertension Denies history of Angina, Congestive Heart Failure, Coronary Artery Disease, Deep Vein Thrombosis, Hypotension, Myocardial Infarction, Peripheral Arterial Disease, Peripheral Venous Disease, Phlebitis, Vasculitis Endocrine Denies history of Type I Diabetes, Type II Diabetes Genitourinary Denies history of End Stage Renal Disease Immunological Denies history of Lupus Erythematosus, Raynaud s, Scleroderma Hukill, Adreona H. ([id number]) Integumentary (Skin) Denies history of History of Burn, History of pressure wounds Musculoskeletal Denies history of Gout, Rheumatoid Arthritis, Osteoarthritis, Osteomyelitis Neurologic Denies history of Dementia, Neuropathy, Quadriplegia, Paraplegia, Seizure Disorder Oncologic Denies history of Received Chemotherapy, Received Radiation Psychiatric Denies history of Anorexia/bulimia, Confinement Anxiety Hospitalization/Surgery History - Colon cancer. Review of Systems (ROS) Constitutional Symptoms (General Health) Denies complaints or symptoms of Fatigue, Fever, Chills, Marked Weight Change. Respiratory Denies complaints or symptoms of Chronic or frequent coughs, Shortness of Breath. Cardiovascular Denies complaints or symptoms of Chest pain, LE edema. Psychiatric Denies complaints or symptoms of Anxiety, Claustrophobia. Objective Constitutional Well-nourished and well-hydrated in no acute distress. Vitals Time Taken: 12:51 PM, Height: 60 in, Weight: 92 lbs, BMI: 18, Temperature: 98.5  F, Pulse: 65 bpm, Respiratory Rate: 16 breaths/min, Blood Pressure: 134/77 mmHg. Respiratory normal breathing without difficulty. clear to auscultation bilaterally. Cardiovascular regular rate and rhythm with normal S1, S2. Psychiatric this patient is able to make decisions and demonstrates good insight into disease process. Alert and Oriented x 3. pleasant and cooperative. General Notes: Patient's wound bed currently again shows excellent signs of new epithelization there is just a very small area that was still open and draining and again this is minimal at this point. Overall I am very pleased with how things have progressed and I am hopeful this will continue to do very well for her. In fact I am expecting this to probably be completely closed as of next week. Do need to protected in the interim Integumentary (Hair, Skin) Wound #16 status is Open. Original cause of wound was Trauma. The wound is located on the Right Elbow. The wound measures 0.1cm length x 0.1cm width x 0.1cm depth; 0cm^2 area and 0cm^3 volume. There is Fat Layer (Subcutaneous Avena, Sharday H. ([id number]) Tissue) Exposed exposed. There is no tunneling or undermining noted. There is a medium amount of serous drainage noted. The wound margin is flat and intact. There is large (67-100%) pink granulation within the wound bed. There is no necrotic tissue within the wound bed. Assessment Active Problems ICD-10 Venous insufficiency (chronic) (peripheral) Unspecified open wound of right elbow, initial encounter Plan Wound Cleansing: Wound #16 Right Elbow: Clean wound with Normal Saline. Secondary Dressing: Wound #16 Right Elbow: ABD pad Conform/Kerlix Other - stetch net Dressing Change Frequency: Wound #16 Right Elbow: Change dressing every other day. Follow-up Appointments: Wound #16 Right Elbow: Return Appointment in 1 week. 1. I am going to go ahead and recommend that we just do an ABD pad with stretch  netting over top of the area for her right elbow. There was just a very small region that was still draining I am hopeful that this will be completely closed by next week. 2. I do recommend she continue to be very careful with her arm obviously she does not want to bump anything into this which could reopen it she has had that happen a couple times already in different spots. Her skin is very fragile. We will see patient back for reevaluation  in 1 week here in the clinic. If anything worsens or changes patient will contact our office for additional recommendations. Electronic Signature(s) Signed: 06/02/2019 1:16:48 PM By: Worthy Keeler PA-C Entered By: Worthy Keeler on 06/02/2019 13:16:48 ESVEIDY, BON ([id number]) -------------------------------------------------------------------------------- ROS/PFSH Details Patient Name: [name]. Date of Service: 06/02/2019 1:00 PM Medical Record Number: [id number] Patient Account Number: [id number] Date of Birth/Sex: 19-Jan-1928 (85 y.o. F) Treating RN: Montey Hora Primary Care Provider: Park Liter Other Clinician: Referring Provider: Park Liter Treating Provider/Extender: [name], [name] Weeks in Treatment: 12 Information Obtained From Patient Constitutional Symptoms (General Health) Complaints and Symptoms: Negative for: Fatigue; Fever; Chills; Marked Weight Change Respiratory Complaints and Symptoms: Negative for: Chronic or frequent coughs; Shortness of Breath Medical History: Positive for: Asthma; Chronic Obstructive Pulmonary Disease (COPD) Negative for: Pneumothorax; Sleep Apnea; Tuberculosis Cardiovascular Complaints and Symptoms: Negative for: Chest pain; LE edema Medical History: Positive for: Arrhythmia - a-fib; Hypertension Negative for: Angina; Congestive Heart Failure; Coronary Artery Disease; Deep Vein Thrombosis; Hypotension; Myocardial Infarction; Peripheral Arterial Disease; Peripheral Venous  Disease; Phlebitis; Vasculitis Psychiatric Complaints and Symptoms: Negative for: Anxiety; Claustrophobia Medical History: Negative for: Anorexia/bulimia; Confinement Anxiety Eyes Medical History: Positive for: Cataracts - surgery 2000; Glaucoma Endocrine Medical History: Negative for: Type I Diabetes; Type II Diabetes Genitourinary Medical History: Negative for: End Stage Renal Disease Holstrom, Amberrose H. ([id number]) Immunological Medical History: Negative for: Lupus Erythematosus; Raynaudos; Scleroderma Integumentary (Skin) Medical History: Negative for: History of Burn; History of pressure wounds Musculoskeletal Medical History: Negative for: Gout; Rheumatoid Arthritis; Osteoarthritis; Osteomyelitis Neurologic Medical History: Negative for: Dementia; Neuropathy; Quadriplegia; Paraplegia; Seizure Disorder Oncologic Medical History: Negative for: Received Chemotherapy; Received Radiation HBO Extended History Items Eyes: Eyes: Cataracts Glaucoma Immunizations Pneumococcal Vaccine: Received Pneumococcal Vaccination: Yes Implantable Devices None Hospitalization / Surgery History Type of Hospitalization/Surgery Colon cancer Family and Social History Cancer: Yes - Siblings,Paternal Grandparents; Diabetes: Yes - Siblings; Heart Disease: Yes - Mother,Father,Child,Paternal Grandparents; Hereditary Spherocytosis: No; Hypertension: Yes - Child; Kidney Disease: No; Lung Disease: No; Seizures: No; Stroke: No; Thyroid Problems: No; Tuberculosis: No; Former smoker - quit 40 yrs ago; Marital Status - Divorced; Alcohol Use: Never; Drug Use: No History; Caffeine Use: Rarely; Financial Concerns: No; Food, Clothing or Shelter Needs: No; Support System Lacking: No; Transportation Concerns: No Physician Affirmation I have reviewed and agree with the above information. Electronic Signature(s) Signed: 06/02/2019 4:22:21 PM By: Montey Hora Signed: 06/03/2019 9:08:00 AM By: Worthy Keeler PA-C Entered By: Worthy Keeler on 06/02/2019 13:16:04 Stech, Jaymie HMarland Kitchen ([id number]) -------------------------------------------------------------------------------- SuperBill Details Patient Name: [name] Date of Service: 06/02/2019 Medical Record Number: [id number] Patient Account Number: [id number] Date of Birth/Sex: Jun 03, 1928 (85 y.o. F) Treating RN: Montey Hora Primary Care Provider: Park Liter Other Clinician: Referring Provider: Park Liter Treating Provider/Extender: [name], [name] Weeks in Treatment: 12 Diagnosis Coding ICD-10 Codes Code Description I87.2 Venous insufficiency (chronic) (peripheral) S51.001A Unspecified open wound of right elbow, initial encounter Facility Procedures CPT4 Code: [id number] Description: 99213 - WOUND CARE VISIT-LEV 3 EST PT Modifier: Quantity: 1 Physician Procedures CPT4 Code: [id number] Description: [id number] - WC PHYS LEVEL 4 - EST PT ICD-10 Diagnosis Description I87.2 Venous insufficiency (chronic) (peripheral) S51.001A Unspecified open wound of right elbow, initial encounter Modifier: Quantity: 1 Electronic Signature(s) Signed: 06/02/2019 2:52:10 PM By: Montey Hora Signed: 06/03/2019 9:08:00 AM By: Worthy Keeler PA-C Previous Signature: 06/02/2019 1:16:57 PM Version By: Worthy Keeler PA-C Entered By: Montey Hora on 06/02/2019 14:52:09

## 2019-06-02 NOTE — Progress Notes (Addendum)
SYESHA, TESFAI ([id number]) Visit Report for 06/02/2019 Arrival Information Details Patient Name: [name], [name]. Date of Service: 06/02/2019 1:00 PM Medical Record Number: [id number] Patient Account Number: [id number] Date of Birth/Sex: November 03, 1927 (85 y.o. F) Treating RN: [name] Primary Care Rae Plotner: Park Liter Other Clinician: Referring Cyruss Arata: Park Liter Treating Jadan Hinojos/Extender: [name], [name] Weeks in Treatment: 12 Visit Information History Since Last Visit Added or deleted any medications: No Patient Arrived: Ambulatory Any new allergies or adverse reactions: No Arrival Time: 12:51 Had a fall or experienced change in No Accompanied By: self activities of daily living that may affect Transfer Assistance: None risk of falls: Patient Identification Verified: Yes Signs or symptoms of abuse/neglect since last visito No Patient Requires Transmission-Based No Hospitalized since last visit: No Precautions: Has Dressing in Place as Prescribed: Yes Patient Has Alerts: Yes Pain Present Now: No Patient Alerts: Patient on Blood Thinner 81mg  aspirin Electronic Signature(s) Signed: 06/02/2019 12:55:27 PM By: [name] Entered By: [name] on 06/02/2019 12:51:25 [name], [name] ([id number]) -------------------------------------------------------------------------------- Clinic Level of Care Assessment Details Patient Name: [name], [name] Date of Service: 06/02/2019 1:00 PM Medical Record Number: [id number] Patient Account Number: [id number] Date of Birth/Sex: Jun 14, 1928 (85 y.o. F) Treating RN: Montey Hora Primary Care Tawania Daponte: Park Liter Other Clinician: Referring Esthefany Herrig: Park Liter Treating Skyra Crichlow/Extender: [name], [name] Weeks in Treatment: 12 Clinic Level of Care Assessment Items TOOL 4 Quantity Score []  - Use when only an EandM is performed on FOLLOW-UP visit 0 ASSESSMENTS - Nursing Assessment / Reassessment X -  Reassessment of Co-morbidities (includes updates in patient status) 1 10 X- 1 5 Reassessment of Adherence to Treatment Plan ASSESSMENTS - Wound and Skin Assessment / Reassessment X - Simple Wound Assessment / Reassessment - one wound 1 5 []  - 0 Complex Wound Assessment / Reassessment - multiple wounds []  - 0 Dermatologic / Skin Assessment (not related to wound area) ASSESSMENTS - Focused Assessment []  - Circumferential Edema Measurements - multi extremities 0 []  - 0 Nutritional Assessment / Counseling / Intervention []  - 0 Lower Extremity Assessment (monofilament, tuning fork, pulses) []  - 0 Peripheral Arterial Disease Assessment (using hand held doppler) ASSESSMENTS - Ostomy and/or Continence Assessment and Care []  - Incontinence Assessment and Management 0 []  - 0 Ostomy Care Assessment and Management (repouching, etc.) PROCESS - Coordination of Care X - Simple Patient / Family Education for ongoing care 1 15 []  - 0 Complex (extensive) Patient / Family Education for ongoing care X- 1 10 Staff obtains Programmer, systems, Records, Test Results / Process Orders []  - 0 Staff telephones HHA, Nursing Homes / Clarify orders / etc []  - 0 Routine Transfer to another Facility (non-emergent condition) []  - 0 Routine Hospital Admission (non-emergent condition) []  - 0 New Admissions / Biomedical engineer / Ordering NPWT, Apligraf, etc. []  - 0 Emergency Hospital Admission (emergent condition) X- 1 10 Simple Discharge Coordination Bunnell, Klaudia H. ([id number]) []  - 0 Complex (extensive) Discharge Coordination PROCESS - Special Needs []  - Pediatric / Minor Patient Management 0 []  - 0 Isolation Patient Management []  - 0 Hearing / Language / Visual special needs []  - 0 Assessment of Community assistance (transportation, D/C planning, etc.) []  - 0 Additional assistance / Altered mentation []  - 0 Support Surface(s) Assessment (bed, cushion, seat, etc.) INTERVENTIONS - Wound Cleansing  / Measurement X - Simple Wound Cleansing - one wound 1 5 []  - 0 Complex Wound Cleansing - multiple wounds X- 1 5 Wound Imaging (photographs - any number of wounds) []  -  0 Wound Tracing (instead of photographs) X- 1 5 Simple Wound Measurement - one wound []  - 0 Complex Wound Measurement - multiple wounds INTERVENTIONS - Wound Dressings X - Small Wound Dressing one or multiple wounds 1 10 []  - 0 Medium Wound Dressing one or multiple wounds []  - 0 Large Wound Dressing one or multiple wounds []  - 0 Application of Medications - topical []  - 0 Application of Medications - injection INTERVENTIONS - Miscellaneous []  - External ear exam 0 []  - 0 Specimen Collection (cultures, biopsies, blood, body fluids, etc.) []  - 0 Specimen(s) / Culture(s) sent or taken to Lab for analysis []  - 0 Patient Transfer (multiple staff / Civil Service fast streamer / Similar devices) []  - 0 Simple Staple / Suture removal (25 or less) []  - 0 Complex Staple / Suture removal (26 or more) []  - 0 Hypo / Hyperglycemic Management (close monitor of Blood Glucose) []  - 0 Ankle / Brachial Index (ABI) - do not check if billed separately X- 1 5 Vital Signs [name], [name] ([id number]) Has the patient been seen at the hospital within the last three years: Yes Total Score: 85 Level Of Care: New/Established - Level 3 Electronic Signature(s) Signed: 06/02/2019 4:22:21 PM By: Montey Hora Entered By: Montey Hora on 06/02/2019 14:51:46 [name], [name] -------------------------------------------------------------------------------- Encounter Discharge Information Details Patient Name: Carol Berger H. Date of Service: 06/02/2019 1:00 PM Medical Record Number: [id number] Patient Account Number: [id number] Date of Birth/Sex: October 10, 1927 (85 y.o. F) Treating RN: [name] Primary Care Ottis Vacha: Park Liter Other Clinician: Referring Aalyah Mansouri: Park Liter Treating Shaton Lore/Extender: [name],  [name] Weeks in Treatment: 12 Encounter Discharge Information Items Discharge Condition: Stable Ambulatory Status: Ambulatory Discharge Destination: Home Transportation: Private Auto Accompanied By: self Schedule Follow-up Appointment: Yes Clinical Summary of Care: Electronic Signature(s) Signed: 06/02/2019 3:51:55 PM By: [name] Entered By: [name] on 06/02/2019 13:19:19 [name], [name] ([id number]) -------------------------------------------------------------------------------- Lower Extremity Assessment Details Patient Name: [name], [name] Date of Service: 06/02/2019 1:00 PM Medical Record Number: [id number] Patient Account Number: [id number] Date of Birth/Sex: [birth year] (85 y.o. F) Treating RN: [name] Primary Care Tarae Wooden: Park Liter Other Clinician: Referring Iridessa Harrow: Park Liter Treating Ezel Vallone/Extender: [name], [name] Weeks in Treatment: 12 Electronic Signature(s) Signed: 06/02/2019 12:55:27 PM By: [name] Entered By: [name] on 06/02/2019 12:53:05 [name], [name] -------------------------------------------------------------------------------- Multi Wound Chart Details Patient Name: [name], [name] Date of Service: 06/02/2019 1:00 PM Medical Record Number: [id number] Patient Account Number: [id number] Date of Birth/Sex: 1928-05-21 (85 y.o. F) Treating RN: Montey Hora Primary Care Yoselyn Mcglade: Park Liter Other Clinician: Referring Jennylee Uehara: Park Liter Treating Airelle Everding/Extender: [name], [name] Weeks in Treatment: 12 Vital Signs Height(in): 60 Pulse(bpm): 65 Weight(lbs): 55 Blood Pressure(mmHg): 134/77 Body Mass Index(BMI): 18 Temperature(F): 98.5 Respiratory Rate 16 (breaths/min): Photos: [N/A:N/A] Wound Location: Right Elbow N/A N/A Wounding Event: Trauma N/A N/A Primary Etiology: Skin Tear N/A N/A Comorbid History: Cataracts, Glaucoma, Asthma, N/A N/A Chronic Obstructive Pulmonary  Disease (COPD), Arrhythmia, Hypertension Date Acquired: 05/11/2019 N/A N/A Weeks of Treatment: 2 N/A N/A Wound Status: Open N/A N/A Measurements L x W x D 0.1x0.1x0.1 N/A N/A (cm) Area (cm) : 0 N/A N/A Volume (cm) : 0 N/A N/A % Reduction in Area: 100.00% N/A N/A % Reduction in Volume: 100.00% N/A N/A Classification: Full Thickness Without N/A N/A Exposed Support Structures Exudate Amount: Medium N/A N/A Exudate Type: Serous N/A N/A Exudate Color: amber N/A N/A Wound Margin: Flat and Intact N/A N/A Granulation Amount: Large (67-100%) N/A N/A  Granulation Quality: Pink N/A N/A Necrotic Amount: None Present (0%) N/A N/A Exposed Structures: Fat Layer (Subcutaneous N/A N/A Tissue) Exposed: Yes Fascia: No Tendon: No Wiemann, Shaquita H. ([id number]) Muscle: No Joint: No Bone: No Epithelialization: Large (67-100%) N/A N/A Treatment Notes Electronic Signature(s) Signed: 06/02/2019 4:22:21 PM By: Montey Hora Entered By: Montey Hora on 06/02/2019 13:14:36 [name], [name] -------------------------------------------------------------------------------- Multi-Disciplinary Care Plan Details Patient Name: [name] Date of Service: 06/02/2019 1:00 PM Medical Record Number: [id number] Patient Account Number: [id number] Date of Birth/Sex: 1927-11-15 (85 y.o. F) Treating RN: Montey Hora Primary Care Kharizma Lesnick: Park Liter Other Clinician: Referring Hobert Poplaski: Park Liter Treating Heer Justiss/Extender: [name], [name] Weeks in Treatment: 12 Active Inactive Abuse / Safety / Falls / Self Care Management Nursing Diagnoses: Potential for falls Goals: Patient will not experience any injury related to falls Date Initiated: 03/10/2019 Target Resolution Date: 06/13/2019 Goal Status: Active Interventions: Assess fall risk on admission and as needed Notes: Orientation to the Wound Care Program Nursing Diagnoses: Knowledge deficit related to the wound  healing center program Goals: Patient/caregiver will verbalize understanding of the [facility] Date Initiated: 03/10/2019 Target Resolution Date: 06/13/2019 Goal Status: Active Interventions: Provide education on orientation to the wound center Notes: Wound/Skin Impairment Nursing Diagnoses: Impaired tissue integrity Goals: Ulcer/skin breakdown will heal within 14 weeks Date Initiated: 03/10/2019 Target Resolution Date: 06/13/2019 Goal Status: Active Interventions: Assess patient/caregiver ability to obtain necessary supplies Jakubowicz, Jaslene H. ([id number]) Assess patient/caregiver ability to perform ulcer/skin care regimen upon admission and as needed Assess ulceration(s) every visit Notes: Electronic Signature(s) Signed: 06/02/2019 4:22:21 PM By: Montey Hora Entered By: Montey Hora on 06/02/2019 13:13:05 Auguste, Clarabel H. ([id number]) -------------------------------------------------------------------------------- Pain Assessment Details Patient Name: [name] Date of Service: 06/02/2019 1:00 PM Medical Record Number: [id number] Patient Account Number: [id number] Date of Birth/Sex: 16-Jan-1928 (85 y.o. F) Treating RN: [name] Primary Care Jayant Kriz: Park Liter Other Clinician: Referring Varun Jourdan: Park Liter Treating Belissa Kooy/Extender: [name], [name] Weeks in Treatment: 12 Active Problems Location of Pain Severity and Description of Pain Patient Has Paino No Site Locations Pain Management and Medication Current Pain Management: Electronic Signature(s) Signed: 06/02/2019 12:55:27 PM By: [name] Entered By: [name] on 06/02/2019 12:51:30 Reger, [name] ([id number]) -------------------------------------------------------------------------------- Patient/Caregiver Education Details Patient Name: [name] Date of Service: 06/02/2019 1:00 PM Medical Record Number: [id number] Patient Account Number:  [id number] Date of Birth/Gender: [birth year] (85 y.o. F) Treating RN: Montey Hora Primary Care Physician: Park Liter Other Clinician: Referring Physician: Park Liter Treating Physician/Extender: [name] Ink in Treatment: 12 Education Assessment Education Provided To: Patient Education Topics Provided Wound/Skin Impairment: Handouts: Other: wound care as ordered Methods: Demonstration, Explain/Verbal Responses: State content correctly Electronic Signature(s) Signed: 06/02/2019 4:22:21 PM By: Montey Hora Entered By: Montey Hora on 06/02/2019 14:52:30 Rome, Yasmeen H. ([id number]) -------------------------------------------------------------------------------- Wound Assessment Details Patient Name: Gaster, Dannie H. Date of Service: 06/02/2019 1:00 PM Medical Record Number: [id number] Patient Account Number: [id number] Date of Birth/Sex: 1928-02-12 (85 y.o. F) Treating RN: Montey Hora Primary Care Kynnedy Carreno: Park Liter Other Clinician: Referring Asaiah Hunnicutt: Park Liter Treating Shereece Wellborn/Extender: [name], [name] Weeks in Treatment: 12 Wound Status Wound Number: 16 Primary Skin Tear Etiology: Wound Location: Right Elbow Wound Open Wounding Event: Trauma Status: Date Acquired: 05/11/2019 Comorbid Cataracts, Glaucoma, Asthma, Chronic Weeks Of Treatment: 2 History: Obstructive Pulmonary Disease (COPD), Clustered Wound: No Arrhythmia, Hypertension Photos Wound Measurements Length: (cm) 0.1 Width: (cm) 0.1 Depth: (cm) 0.1 Area: (cm) 0 Volume: (cm) 0 % Reduction in  Area: 100% % Reduction in Volume: 100% Epithelialization: Large (67-100%) Tunneling: No Undermining: No Wound Description Full Thickness Without Exposed Support Foul Odo Classification: Structures Slough/F Wound Margin: Flat and Intact Exudate Medium Amount: Exudate Type: Serous Exudate Color: amber r After Cleansing: No ibrino Yes Wound Bed Granulation Amount:  Large (67-100%) Exposed Structure Granulation Quality: Pink Fascia Exposed: No Necrotic Amount: None Present (0%) Fat Layer (Subcutaneous Tissue) Exposed: Yes Tendon Exposed: No Muscle Exposed: No Joint Exposed: No Bone Exposed: No Estill, Monifah H. ([id number]) Treatment Notes Wound #16 (Right Elbow) Notes ABD, conform, stretch net Electronic Signature(s) Signed: 06/02/2019 4:22:21 PM By: Montey Hora Previous Signature: 06/02/2019 12:55:27 PM Version By: [name] Entered By: Montey Hora on 06/02/2019 13:14:25 Reicks, Milli H. ([id number]) -------------------------------------------------------------------------------- Vitals Details Patient Name: Carol Berger H. Date of Service: 06/02/2019 1:00 PM Medical Record Number: [id number] Patient Account Number: [id number] Date of Birth/Sex: 1928-03-25 (85 y.o. F) Treating RN: [name] Primary Care Kriss Perleberg: Park Liter Other Clinician: Referring Blake Vetrano: Park Liter Treating Raheen Capili/Extender: [name], [name] Weeks in Treatment: 12 Vital Signs Time Taken: 12:51 Temperature (F): 98.5 Height (in): 60 Pulse (bpm): 65 Weight (lbs): 92 Respiratory Rate (breaths/min): 16 Body Mass Index (BMI): 18 Blood Pressure (mmHg): 134/77 Reference Range: 80 - 120 mg / dl Electronic Signature(s) Signed: 06/02/2019 12:55:27 PM By: [name] Entered By: [name] on 06/02/2019 12:51:43

## 2019-06-04 ENCOUNTER — Other Ambulatory Visit
Admission: RE | Admit: 2019-06-04 | Discharge: 2019-06-04 | Disposition: A | Discharge status: Home / Self Care | Payer: Medicare Other | Source: Ambulatory Visit | Attending: Unknown Physician Specialty | Admitting: Unknown Physician Specialty

## 2019-06-08 ENCOUNTER — Ambulatory Visit: Admit: 2019-06-08 | Payer: Medicare Other | Admitting: Unknown Physician Specialty

## 2019-06-08 SURGERY — COLONOSCOPY WITH PROPOFOL
Anesthesia: General

## 2019-06-09 ENCOUNTER — Encounter: Payer: Medicare Other | Attending: Physician Assistant | Admitting: Physician Assistant

## 2019-06-09 ENCOUNTER — Other Ambulatory Visit
Admission: RE | Admit: 2019-06-09 | Discharge: 2019-06-09 | Disposition: A | Discharge status: Home / Self Care | Payer: Medicare Other | Source: Ambulatory Visit | Attending: Physician Assistant | Admitting: Physician Assistant

## 2019-06-09 ENCOUNTER — Other Ambulatory Visit: Payer: Self-pay

## 2019-06-09 DIAGNOSIS — I872 Venous insufficiency (chronic) (peripheral): Secondary | ICD-10-CM | POA: Insufficient documentation

## 2019-06-09 DIAGNOSIS — S51011A Laceration without foreign body of right elbow, initial encounter: Secondary | ICD-10-CM | POA: Diagnosis not present

## 2019-06-09 DIAGNOSIS — S81802A Unspecified open wound, left lower leg, initial encounter: Secondary | ICD-10-CM | POA: Diagnosis not present

## 2019-06-09 DIAGNOSIS — I10 Essential (primary) hypertension: Secondary | ICD-10-CM | POA: Insufficient documentation

## 2019-06-09 DIAGNOSIS — J432 Centrilobular emphysema: Secondary | ICD-10-CM | POA: Diagnosis not present

## 2019-06-09 DIAGNOSIS — Z87891 Personal history of nicotine dependence: Secondary | ICD-10-CM | POA: Diagnosis not present

## 2019-06-09 DIAGNOSIS — S51811A Laceration without foreign body of right forearm, initial encounter: Secondary | ICD-10-CM | POA: Diagnosis not present

## 2019-06-09 DIAGNOSIS — L03113 Cellulitis of right upper limb: Secondary | ICD-10-CM | POA: Insufficient documentation

## 2019-06-09 DIAGNOSIS — L97812 Non-pressure chronic ulcer of other part of right lower leg with fat layer exposed: Secondary | ICD-10-CM | POA: Insufficient documentation

## 2019-06-09 DIAGNOSIS — J449 Chronic obstructive pulmonary disease, unspecified: Secondary | ICD-10-CM | POA: Diagnosis not present

## 2019-06-09 DIAGNOSIS — Z681 Body mass index (BMI) 19 or less, adult: Secondary | ICD-10-CM | POA: Insufficient documentation

## 2019-06-09 DIAGNOSIS — X58XXXA Exposure to other specified factors, initial encounter: Secondary | ICD-10-CM | POA: Diagnosis not present

## 2019-06-09 DIAGNOSIS — B369 Superficial mycosis, unspecified: Secondary | ICD-10-CM | POA: Diagnosis not present

## 2019-06-09 DIAGNOSIS — L98492 Non-pressure chronic ulcer of skin of other sites with fat layer exposed: Secondary | ICD-10-CM | POA: Diagnosis not present

## 2019-06-09 DIAGNOSIS — I4891 Unspecified atrial fibrillation: Secondary | ICD-10-CM | POA: Diagnosis not present

## 2019-06-09 NOTE — Progress Notes (Addendum)
MCKENLIE, GRANDJEAN ([id number]) Visit Report for 06/09/2019 Chief Complaint Document Details Patient Name: [name], [name]. Date of Service: 06/09/2019 1:00 PM Medical Record Number: [id number] Patient Account Number: [id number] Date of Birth/Sex: 1927-10-01 (85 y.o. F) Treating RN: Montey Hora Primary Care Provider: Park Liter Other Clinician: Referring Provider: Park Liter Treating Provider/Extender: [name], Latreshia Beauchaine Weeks in Treatment: 13 Information Obtained from: Patient Chief Complaint Right elbow skin tear Electronic Signature(s) Signed: 06/09/2019 1:08:43 PM By: Worthy Keeler PA-C Entered By: Worthy Keeler on 06/09/2019 13:08:43 Jacquot, Arco. ([id number]) -------------------------------------------------------------------------------- HPI Details Patient Name: [name]. Date of Service: 06/09/2019 1:00 PM Medical Record Number: [id number] Patient Account Number: [id number] Date of Birth/Sex: 27-Oct-1927 (85 y.o. F) Treating RN: Montey Hora Primary Care Provider: Park Liter Other Clinician: Referring Provider: Park Liter Treating Provider/Extender: [name], Shakeya Kerkman Weeks in Treatment: 13 History of Present Illness HPI Description: 02/12/18 ADMISSION This is an 85 year old woman who is recently moved to [place] from [place]. Her story began in February where she fell on some ice suffering extensive lacerations to her bilateral lower extremities. She is able to show me extensive pictures of the right lower leg but with going to a wound care center and [place] 3 times a week these eventually closed over. She has been left with 1 area on the upper lateral left calf. She has been applying Neosporin to this and applying a bandage. Currently this measures 2 x 1.5 cm. The patient is not a diabetic. She is an ex-smoker quitting 40 years ago. She does have COPD by description. ABIs in our clinic were 1.32 on the right and 1.03 on the  left 02/19/18; right lower leg wound which was initially trauma. The area that we look that last week is smaller. Still covered in a nonviable surface however. We are using Iodoflex 02/26/18; right lower leg wound which was initially trauma in the setting of chronic venous insufficiency. Surface of the wound looks much better healthy granulation advancing epithelialization. We have good edema control 03/05/18; right lower leg wound which was initially trauma in the setting of chronic venous insufficiency. She continues to make nice progress here. We have good edema control oHe arrives today with a new traumatic laceration on her right dorsal forearm which she states happened while she was putting on a sweatshirt 03/12/18; right lower leg wound as closed however it still looks vulnerable. She has chronic venous insufficiency. oThe new wound from last week a traumatic area on her right dorsal or arm unfortunately does not have a viable surface. I remove some nonviable skin and necrotic subcutaneous debris from the wound surface. Hemostasis with silver nitrate and direct pressure 03/19/18; right lower leg wound is closed. She has chronic venous insufficiency and will need ongoing compression oThe new wounds from 2 weeks ago on the right upper elbow is closed. The area on her right dorsal forearm continues to have a nonviable surface requiring debridement 03/26/18; we'll need to look into ongoing compression for her legs. 2 wounds on the right upper elbow is closed. The area on the right dorsal forearm looks better. Hydrofera Blue secondary to hypertrophic granulation 04/02/18; he has compression stockings although she did not wear them today. Severe chronic venous insufficiency. She has 2 wounds on the right upper elbow which I said were closed last week which actually are although they're very small and she has a smaller clean wound on the right dorsal forearm. We've been using Hydrofera Blue secondary to  some  upper granulation. All of this looks better 04/09/18-She is seen in follow up evaluation for a right elbow and right forearm skin tear. The right elbow is healed. We will continue with same treatment plan and she'll follow next week 04/16/18-She is seen in follow-up evaluation for right forearm skin tear, this is essentially healed. We will cover with foam border and she will follow-up next week 04/23/18; the patient arrives with a new skin tear on her right dorsal arm just below the elbow. She got this while carrying a box. She has a linear skin tear. There is no depth I don't think this should've been sutured. There is a skin flap medially I'm not sure if this will be viable or not 04/30/18; the new skin tear from last week unfortunately has remained viable in terms of the skin flap. She has a small open area that looks healthy. Using moistened silver collagen 05/07/18; right dorsal arm skin tear. Nonviable tissue over the remnants of the wound. We've been using silver collagen 05/21/18; right dorsal arm skin tear. Nonviable tissue over a small remnant of the wound was washed off. We've been using silver collagen which we will continue 06/04/18; the right dorsal arm skin tear has healed. She arrives today with a traumatic wound just above the olecranon of her left elbow. This is a skin tear with a nonviable nonadherent flap which was removed. We'll use silver collagen here. Also noteworthy she arrives without her compression stockings 06/18/18; the area just above the olecranon of her left elbow. It is smaller and generally has a healthy surface. I was surprised [name], [name]. ([id number]) to learn that the patient is actually changing this herself although. Appears this week she was putting some topical lidocaine on this that she bought over-the-counter at the drugstore. She did get supplies at home she didn't know how to put it on. She drove herself here today i.e. not eligible for home  health Readmission: 11/04/18 on evaluation today patient presents for evaluation our clinic news to include the she has on her lower extremity which occurred as the result of having hit this on a piece of furniture. This was back in December. She states that her daughter has been trying to get her to come into have this evaluated here the wound care center but she was being somewhat stubborn. Nonetheless upon evaluation today the wound really appears to be potentially healed she does have some swelling of lower extremity which I think will come into play. With that being said he tells me that even yesterday this was still making clear fluid and therefore there may be some issues with continued problems with the swelling and drainage secondary to her venous stasis. Nonetheless overall wound appears to be doing fairly well which is good news. 11/11/18 on evaluation today patient actually appears to be doing excellent in the area of question that was green just the day before he saw her last time actually appears to have not drain that always in past week. I think this is done very well and at this point though she's had some discomfort I do not see any evidence of open wound which he needs to continue see wound care. Readmission: 03/10/19 on evaluation today patient presents for reevaluation here in our clinic concerning issues that she actually is having with the new problem on her right lower leg due to trauma that occurred when a car door shut on her leg last Thursday. This is not been quite a  week and she does have several skin tears unfortunately that resulted from this injury. Fortunately there's no signs of active infection at this time. No fevers, chills, nausea, or vomiting noted at this time. She does have some hot tissue noted at this point that has me concerned about the possibility of needing to move this in order to allow the areas to heal appropriately. Fortunately there's no signs of  active infection at this time. 03/17/19-Patient returns to clinic at 1 week, we have been using Prisma on the larger wounds with Kerlix and Xeroform. As many as 5 wounds were number last time 3 of which have closed, leaving the right proximal tibial and right distal medial leg wound both of which are looking good 03/24/19 on evaluation today patient appears to be doing better in regard to her wounds at this time. Fortunately there is no sign of active infection at this time. No fevers chills noted. She's been tolerating the dressing changes without complication which is good news she's been performing these herself it sounds like home health is not going to be coming out it sounds as if they feel like she may be able to take care of yourself and not be technically homebound. Nonetheless though that is unfortunate it does seem like she's done fairly well with taking care of yourself from the standpoint of the dressings. 03/31/19 on evaluation today patient actually appears to be doing well with regard to her lower extremity ulcers. She had one area to heal today and in regard to her right lower extremity were she continues to have an open wound I did have to perform some sharp debridement today. Subsequently the patient still has an area of what appears to be possibly a small hematoma on the anterior portion of her left lower extremity. This is not open enough at this time but nonetheless does seem to show some evidence of hardening which is not necessarily a bad thing. Eventually this may pop off but again I'm not interested in really removing this prematurely. 04/07/19 on evaluation today patient actually appears to be doing well with regard to her lower extremity ulcers. She is been tolerating the dressing changes without complication. She seems to be making excellent progress which is great news. She does have one small new skin tear area but the good news is she was able to pull the skin back over  and it really looks like it is healing quite nicely. Overall that is just a very small region and again seems to be healing nicely. 04/14/19 upon evaluation today patient actually appears to be doing quite well with regard to the wounds on the right lower extremity. In fact everything appears to be healed as of today. This is excellent news. She does however have the area on the left lower extremity which is showing some signs of loosening up we have been watching this area it is no longer fluctuant and in fact with the Betadine on it it is been slowly dry now but is now lifting up on the edge. There may be a wound underneath this region. 04/21/2019 on evaluation today patient appears to be doing excellent in regard to her bilateral lower extremities. In fact she has been tolerating the dressing changes without complication does not seem to show any signs of infection at this point. In fact based on what I am seeing currently I think she is completely healed. [name], [name] 05/04/2019 on evaluation today patient actually appears to be  doing quite well with regard to her right elbow. She has been tolerating the dressing changes without complication. Fortunately there is no signs of active infection at this time. Overall I am very pleased with how things are progressing. The elbow almost appears to be completely healed though it is not 100% today. 05/14/2019 on evaluation today patient actually appears to be doing well with regard to her elbow skin tear that we have been treating in fact this appears to be completely healed. Unfortunately she has a new skin tear noted as of today which actually occurred in the last week since I previously saw her. This again is unfortunate that it has been so long because I am not going to be able to actually pull the skin over where you have to trim this away and let it heal by second intent. This new wound is in the right elbow region as well but  again separate from the original ulcer. 05/19/2019 on evaluation today patient appears to be doing well with regard to her skin tear on her elbow. She has been tolerating the dressing changes without complication. Fortunately there is no signs of active infection at this time. No fever chills noted. Overall I am very pleased with how things appear today. She is not having any significant pain which is also excellent news. 05/26/2019 on evaluation today patient actually appears to be doing well with regard to her right elbow ulcer. She has been tolerating the dressing changes without complication. Fortunately there is no signs of active infection at this time. No fevers, chills, nausea, vomiting, or diarrhea. 06/02/2019 on evaluation today patient appears to be doing very well with regard to her wound of the right elbow. In fact there is just a very small opening still remaining for the most part this is completely closed and appears to be doing excellent. There is no signs of active infection at this time which is also good news. No fevers, chills, nausea, vomiting, or diarrhea. 06/09/2019 on evaluation today patient appears to actually be doing excellent with regard to her original wound on her elbow which really appears to be completely healed at this point. With that being said she is having issues with an area generally of weeping in this region which I am unsure exactly what is causing this. Does not appear to be obviously bacterially infected although that could be part of the issue. With that being said I do believe that there is also a chance there could be a fungal infection here. Electronic Signature(s) Signed: 06/09/2019 1:14:49 PM By: Worthy Keeler PA-C Entered By: Worthy Keeler on 06/09/2019 13:14:49 [name], [name] ([id number]) -------------------------------------------------------------------------------- Physical Exam Details Patient Name: [name], [name] Date of  Service: 06/09/2019 1:00 PM Medical Record Number: [id number] Patient Account Number: [id number] Date of Birth/Sex: [birth year] (85 y.o. F) Treating RN: Montey Hora Primary Care Provider: Park Liter Other Clinician: Referring Provider: Park Liter Treating Provider/Extender: [name], Maggy Wyble Weeks in Treatment: 76 Constitutional Well-nourished and well-hydrated in no acute distress. Respiratory normal breathing without difficulty. clear to auscultation bilaterally. Cardiovascular regular rate and rhythm with normal S1, S2. Psychiatric this patient is able to make decisions and demonstrates good insight into disease process. Alert and Oriented x 3. pleasant and cooperative. Notes Upon further inspection patient's wound does appear to be weeping and again that does concern me at least for the possibility of cellulitis. With that being said is not exactly consistent with the cellulitic appearance and in fact this  might even be more of a fungal infection especially considering we have been wrapping her elbow for such a long time. Currently my suggestion is good to be likely that we use some topical nystatin powder, obtain a wound culture, and then subsequently we will see if anything shows up on this from the standpoint of a bacterial infection and then add antibiotics as needed. Electronic Signature(s) Signed: 06/09/2019 1:15:26 PM By: Worthy Keeler PA-C Entered By: Worthy Keeler on 06/09/2019 13:15:26 Klammer, [name] -------------------------------------------------------------------------------- Physician Orders Details Patient Name: [name]. Date of Service: 06/09/2019 1:00 PM Medical Record Number: [id number] Patient Account Number: [id number] Date of Birth/Sex: [birth year] (85 y.o. F) Treating RN: Montey Hora Primary Care Provider: Park Liter Other Clinician: Referring Provider: Park Liter Treating Provider/Extender: [name],  Kaloni Bisaillon Weeks in Treatment: 60 Verbal / Phone Orders: No Diagnosis Coding ICD-10 Coding Code Description I87.2 Venous insufficiency (chronic) (peripheral) S51.001A Unspecified open wound of right elbow, initial encounter Wound Cleansing Wound #16 Right Elbow o Clean wound with Normal Saline. Primary Wound Dressing Wound #16 Right Elbow o Other: - Nystatin Powder Secondary Dressing Wound #16 Right Elbow o ABD pad o Conform/Kerlix o Other - stetch net Dressing Change Frequency Wound #16 Right Elbow o Change dressing every day. Follow-up Appointments Wound #16 Right Elbow o Return Appointment in 1 week. Laboratory o Bacteria identified in Wound by Culture (MICRO) oooo LOINC Code: [id number] Convenience Name: Wound culture routine Patient Medications Allergies: No Known Allergies Notifications Medication Indication Start End nystatin 06/09/2019 DOSE topical 100,000 unit/gram powder - powder topical applied to the right affected elbow region 1 time a day with each dressing change as directed. DONNAMARIA, KOFFLER ([id number]) Electronic Signature(s) Signed: 06/09/2019 1:16:59 PM By: Worthy Keeler PA-C Entered By: Worthy Keeler on 06/09/2019 13:16:58 JASILYN, WALT ([id number]) -------------------------------------------------------------------------------- Problem List Details Patient Name: [name]. Date of Service: 06/09/2019 1:00 PM Medical Record Number: [id number] Patient Account Number: [id number] Date of Birth/Sex: [birth year] (85 y.o. F) Treating RN: Montey Hora Primary Care Provider: Park Liter Other Clinician: Referring Provider: Park Liter Treating Provider/Extender: [name], Chevy Sweigert Weeks in Treatment: 13 Active Problems ICD-10 Evaluated Encounter Code Description Active Date Today Diagnosis I87.2 Venous insufficiency (chronic) (peripheral) 03/10/2019 No Yes S51.001A Unspecified open wound of right elbow, initial  encounter 04/28/2019 No Yes Inactive Problems Resolved Problems ICD-10 Code Description Active Date Resolved Date L97.812 Non-pressure chronic ulcer of other part of right lower leg with fat 03/10/2019 03/10/2019 layer exposed Electronic Signature(s) Signed: 06/09/2019 1:08:37 PM By: Worthy Keeler PA-C Entered By: Worthy Keeler on 06/09/2019 13:08:37 Pries, Tallia Lemmie Wise ([id number]) -------------------------------------------------------------------------------- Progress Note Details Patient Name: [name], [name] Date of Service: 06/09/2019 1:00 PM Medical Record Number: [id number] Patient Account Number: [id number] Date of Birth/Sex: [birth year] (85 y.o. F) Treating RN: Montey Hora Primary Care Provider: Park Liter Other Clinician: Referring Provider: Park Liter Treating Provider/Extender: [name], Lesleyanne Politte Weeks in Treatment: 13 Subjective Chief Complaint Information obtained from Patient Right elbow skin tear History of Present Illness (HPI) 02/12/18 ADMISSION This is an 85 year old woman who is recently moved to [place] from [place]. Her story began in February where she fell on some ice suffering extensive lacerations to her bilateral lower extremities. She is able to show me extensive pictures of the right lower leg but with going to a wound care center and [place] 3 times a week these eventually closed over. She has been left with 1 area on the upper lateral  left calf. She has been applying Neosporin to this and applying a bandage. Currently this measures 2 x 1.5 cm. The patient is not a diabetic. She is an ex-smoker quitting 40 years ago. She does have COPD by description. ABIs in our clinic were 1.32 on the right and 1.03 on the left 02/19/18; right lower leg wound which was initially trauma. The area that we look that last week is smaller. Still covered in a nonviable surface however. We are using Iodoflex 02/26/18; right lower leg wound which was initially  trauma in the setting of chronic venous insufficiency. Surface of the wound looks much better healthy granulation advancing epithelialization. We have good edema control 03/05/18; right lower leg wound which was initially trauma in the setting of chronic venous insufficiency. She continues to make nice progress here. We have good edema control He arrives today with a new traumatic laceration on her right dorsal forearm which she states happened while she was putting on a sweatshirt 03/12/18; right lower leg wound as closed however it still looks vulnerable. She has chronic venous insufficiency. The new wound from last week a traumatic area on her right dorsal or arm unfortunately does not have a viable surface. I remove some nonviable skin and necrotic subcutaneous debris from the wound surface. Hemostasis with silver nitrate and direct pressure 03/19/18; right lower leg wound is closed. She has chronic venous insufficiency and will need ongoing compression The new wounds from 2 weeks ago on the right upper elbow is closed. The area on her right dorsal forearm continues to have a nonviable surface requiring debridement 03/26/18; we'll need to look into ongoing compression for her legs. 2 wounds on the right upper elbow is closed. The area on the right dorsal forearm looks better. Hydrofera Blue secondary to hypertrophic granulation 04/02/18; he has compression stockings although she did not wear them today. Severe chronic venous insufficiency. She has 2 wounds on the right upper elbow which I said were closed last week which actually are although they're very small and she has a smaller clean wound on the right dorsal forearm. We've been using Hydrofera Blue secondary to some upper granulation. All of this looks better 04/09/18-She is seen in follow up evaluation for a right elbow and right forearm skin tear. The right elbow is healed. We will continue with same treatment plan and she'll follow next  week 04/16/18-She is seen in follow-up evaluation for right forearm skin tear, this is essentially healed. We will cover with foam border and she will follow-up next week 04/23/18; the patient arrives with a new skin tear on her right dorsal arm just below the elbow. She got this while carrying a box. She has a linear skin tear. There is no depth I don't think this should've been sutured. There is a skin flap medially I'm not sure if this will be viable or not 04/30/18; the new skin tear from last week unfortunately has remained viable in terms of the skin flap. She has a small open area that looks healthy. Using moistened silver collagen 05/07/18; right dorsal arm skin tear. Nonviable tissue over the remnants of the wound. We've been using silver collagen [name], [name] ([id number]) 05/21/18; right dorsal arm skin tear. Nonviable tissue over a small remnant of the wound was washed off. We've been using silver collagen which we will continue 06/04/18; the right dorsal arm skin tear has healed. She arrives today with a traumatic wound just above the olecranon of her left elbow. This  is a skin tear with a nonviable nonadherent flap which was removed. We'll use silver collagen here. Also noteworthy she arrives without her compression stockings 06/18/18; the area just above the olecranon of her left elbow. It is smaller and generally has a healthy surface. I was surprised to learn that the patient is actually changing this herself although. Appears this week she was putting some topical lidocaine on this that she bought over-the-counter at the drugstore. She did get supplies at home she didn't know how to put it on. She drove herself here today i.e. not eligible for home health Readmission: 11/04/18 on evaluation today patient presents for evaluation our clinic news to include the she has on her lower extremity which occurred as the result of having hit this on a piece of furniture. This was back in  December. She states that her daughter has been trying to get her to come into have this evaluated here the wound care center but she was being somewhat stubborn. Nonetheless upon evaluation today the wound really appears to be potentially healed she does have some swelling of lower extremity which I think will come into play. With that being said he tells me that even yesterday this was still making clear fluid and therefore there may be some issues with continued problems with the swelling and drainage secondary to her venous stasis. Nonetheless overall wound appears to be doing fairly well which is good news. 11/11/18 on evaluation today patient actually appears to be doing excellent in the area of question that was green just the day before he saw her last time actually appears to have not drain that always in past week. I think this is done very well and at this point though she's had some discomfort I do not see any evidence of open wound which he needs to continue see wound care. Readmission: 03/10/19 on evaluation today patient presents for reevaluation here in our clinic concerning issues that she actually is having with the new problem on her right lower leg due to trauma that occurred when a car door shut on her leg last Thursday. This is not been quite a week and she does have several skin tears unfortunately that resulted from this injury. Fortunately there's no signs of active infection at this time. No fevers, chills, nausea, or vomiting noted at this time. She does have some hot tissue noted at this point that has me concerned about the possibility of needing to move this in order to allow the areas to heal appropriately. Fortunately there's no signs of active infection at this time. 03/17/19-Patient returns to clinic at 1 week, we have been using Prisma on the larger wounds with Kerlix and Xeroform. As many as 5 wounds were number last time 3 of which have closed, leaving the right  proximal tibial and right distal medial leg wound both of which are looking good 03/24/19 on evaluation today patient appears to be doing better in regard to her wounds at this time. Fortunately there is no sign of active infection at this time. No fevers chills noted. She's been tolerating the dressing changes without complication which is good news she's been performing these herself it sounds like home health is not going to be coming out it sounds as if they feel like she may be able to take care of yourself and not be technically homebound. Nonetheless though that is unfortunate it does seem like she's done fairly well with taking care of yourself from the standpoint  of the dressings. 03/31/19 on evaluation today patient actually appears to be doing well with regard to her lower extremity ulcers. She had one area to heal today and in regard to her right lower extremity were she continues to have an open wound I did have to perform some sharp debridement today. Subsequently the patient still has an area of what appears to be possibly a small hematoma on the anterior portion of her left lower extremity. This is not open enough at this time but nonetheless does seem to show some evidence of hardening which is not necessarily a bad thing. Eventually this may pop off but again I'm not interested in really removing this prematurely. 04/07/19 on evaluation today patient actually appears to be doing well with regard to her lower extremity ulcers. She is been tolerating the dressing changes without complication. She seems to be making excellent progress which is great news. She does have one small new skin tear area but the good news is she was able to pull the skin back over and it really looks like it is healing quite nicely. Overall that is just a very small region and again seems to be healing nicely. 04/14/19 upon evaluation today patient actually appears to be doing quite well with regard to the wounds  on the right lower extremity. In fact everything appears to be healed as of today. This is excellent news. She does however have the area on the left lower extremity which is showing some signs of loosening up we have been watching this area it is no longer fluctuant and in fact with the Betadine on it it is been slowly dry now but is now lifting up on the edge. There may be a wound underneath [name], [name] ([id number]) this region. 04/21/2019 on evaluation today patient appears to be doing excellent in regard to her bilateral lower extremities. In fact she has been tolerating the dressing changes without complication does not seem to show any signs of infection at this point. In fact based on what I am seeing currently I think she is completely healed. 05/04/2019 on evaluation today patient actually appears to be doing quite well with regard to her right elbow. She has been tolerating the dressing changes without complication. Fortunately there is no signs of active infection at this time. Overall I am very pleased with how things are progressing. The elbow almost appears to be completely healed though it is not 100% today. 05/14/2019 on evaluation today patient actually appears to be doing well with regard to her elbow skin tear that we have been treating in fact this appears to be completely healed. Unfortunately she has a new skin tear noted as of today which actually occurred in the last week since I previously saw her. This again is unfortunate that it has been so long because I am not going to be able to actually pull the skin over where you have to trim this away and let it heal by second intent. This new wound is in the right elbow region as well but again separate from the original ulcer. 05/19/2019 on evaluation today patient appears to be doing well with regard to her skin tear on her elbow. She has been tolerating the dressing changes without complication. Fortunately there is no  signs of active infection at this time. No fever chills noted. Overall I am very pleased with how things appear today. She is not having any significant pain which is also excellent news. 05/26/2019 on  evaluation today patient actually appears to be doing well with regard to her right elbow ulcer. She has been tolerating the dressing changes without complication. Fortunately there is no signs of active infection at this time. No fevers, chills, nausea, vomiting, or diarrhea. 06/02/2019 on evaluation today patient appears to be doing very well with regard to her wound of the right elbow. In fact there is just a very small opening still remaining for the most part this is completely closed and appears to be doing excellent. There is no signs of active infection at this time which is also good news. No fevers, chills, nausea, vomiting, or diarrhea. 06/09/2019 on evaluation today patient appears to actually be doing excellent with regard to her original wound on her elbow which really appears to be completely healed at this point. With that being said she is having issues with an area generally of weeping in this region which I am unsure exactly what is causing this. Does not appear to be obviously bacterially infected although that could be part of the issue. With that being said I do believe that there is also a chance there could be a fungal infection here. Patient History Information obtained from Patient. Family History Cancer - Siblings,Paternal Grandparents, Diabetes - Siblings, Heart Disease - Mother,Father,Child,Paternal Grandparents, Hypertension - Child, No family history of Hereditary Spherocytosis, Kidney Disease, Lung Disease, Seizures, Stroke, Thyroid Problems, Tuberculosis. Social History Former smoker - quit 40 yrs ago, Marital Status - Divorced, Alcohol Use - Never, Drug Use - No History, Caffeine Use - Rarely. Medical History Eyes Patient has history of Cataracts - surgery 2000,  Glaucoma Respiratory Patient has history of Asthma, Chronic Obstructive Pulmonary Disease (COPD) Denies history of Pneumothorax, Sleep Apnea, Tuberculosis Cardiovascular Patient has history of Arrhythmia - a-fib, Hypertension Denies history of Angina, Congestive Heart Failure, Coronary Artery Disease, Deep Vein Thrombosis, Hypotension, Myocardial Infarction, Peripheral Arterial Disease, Peripheral Venous Disease, Phlebitis, Vasculitis Ignasiak, Mazie H. ([id number]) Endocrine Denies history of Type I Diabetes, Type II Diabetes Genitourinary Denies history of End Stage Renal Disease Immunological Denies history of Lupus Erythematosus, Raynaud s, Scleroderma Integumentary (Skin) Denies history of History of Burn, History of pressure wounds Musculoskeletal Denies history of Gout, Rheumatoid Arthritis, Osteoarthritis, Osteomyelitis Neurologic Denies history of Dementia, Neuropathy, Quadriplegia, Paraplegia, Seizure Disorder Oncologic Denies history of Received Chemotherapy, Received Radiation Psychiatric Denies history of Anorexia/bulimia, Confinement Anxiety Hospitalization/Surgery History - Colon cancer. Review of Systems (ROS) Constitutional Symptoms (General Health) Denies complaints or symptoms of Fatigue, Fever, Chills, Marked Weight Change. Respiratory Denies complaints or symptoms of Chronic or frequent coughs, Shortness of Breath. Cardiovascular Denies complaints or symptoms of Chest pain, LE edema. Psychiatric Denies complaints or symptoms of Anxiety, Claustrophobia. Objective Constitutional Well-nourished and well-hydrated in no acute distress. Vitals Time Taken: 12:58 PM, Height: 60 in, Weight: 92 lbs, BMI: 18, Temperature: 98.3 F, Pulse: 53 bpm, Respiratory Rate: 16 breaths/min, Blood Pressure: 136/95 mmHg. Respiratory normal breathing without difficulty. clear to auscultation bilaterally. Cardiovascular regular rate and rhythm with normal S1,  S2. Psychiatric this patient is able to make decisions and demonstrates good insight into disease process. Alert and Oriented x 3. pleasant and cooperative. General Notes: Upon further inspection patient's wound does appear to be weeping and again that does concern me at least for the possibility of cellulitis. With that being said is not exactly consistent with the cellulitic appearance and in fact this might [name], [name] ([id number]) even be more of a fungal infection especially considering we have been wrapping her  elbow for such a long time. Currently my suggestion is good to be likely that we use some topical nystatin powder, obtain a wound culture, and then subsequently we will see if anything shows up on this from the standpoint of a bacterial infection and then add antibiotics as needed. Integumentary (Hair, Skin) Wound #16 status is Open. Original cause of wound was Trauma. The wound is located on the Right Elbow. The wound measures 5cm length x 3cm width x 0.1cm depth; 11.781cm^2 area and 1.178cm^3 volume. There is Fat Layer (Subcutaneous Tissue) Exposed exposed. There is no tunneling or undermining noted. There is a medium amount of serous drainage noted. The wound margin is flat and intact. There is large (67-100%) pink granulation within the wound bed. There is no necrotic tissue within the wound bed. Assessment Active Problems ICD-10 Venous insufficiency (chronic) (peripheral) Unspecified open wound of right elbow, initial encounter Plan Wound Cleansing: Wound #16 Right Elbow: Clean wound with Normal Saline. Primary Wound Dressing: Wound #16 Right Elbow: Other: - Nystatin Powder Secondary Dressing: Wound #16 Right Elbow: ABD pad Conform/Kerlix Other - stetch net Dressing Change Frequency: Wound #16 Right Elbow: Change dressing every day. Follow-up Appointments: Wound #16 Right Elbow: Return Appointment in 1 week. Laboratory ordered were: Wound culture  routine The following medication(s) was prescribed: nystatin topical 100,000 unit/gram powder powder topical applied to the right affected elbow region 1 time a day with each dressing change as directed. starting 06/09/2019 1. I am in a suggest currently that we go ahead and send in a prescription for nystatin powder for the patient. She is in agreement with this plan. With that being said I am also going to recommend at this time that we just use an ABD pad and stretch netting in order to cover the area and I did obtain a wound culture to evaluate for whether or not there may be an [name], [name]. ([id number]) infection currently. 2. I am going to recommend the patient continue to wash this normally as she has been doing with Dial antibacterial soap I think that still appropriate. 3. I do recommend the patient try to keep the areas dry as possible and I think the nystatin powder will help in this regard. We will see patient back for reevaluation in 1 week here in the clinic. If anything worsens or changes patient will contact our office for additional recommendations. Electronic Signature(s) Signed: 06/09/2019 1:17:14 PM By: Worthy Keeler PA-C Entered By: Worthy Keeler on 06/09/2019 13:17:14 [name], [name] ([id number]) -------------------------------------------------------------------------------- ROS/PFSH Details Patient Name: [name]. Date of Service: 06/09/2019 1:00 PM Medical Record Number: [id number] Patient Account Number: [id number] Date of Birth/Sex: 1928/07/17 (85 y.o. F) Treating RN: Montey Hora Primary Care Provider: Park Liter Other Clinician: Referring Provider: Park Liter Treating Provider/Extender: [name], Jahquan Klugh Weeks in Treatment: 13 Information Obtained From Patient Constitutional Symptoms (General Health) Complaints and Symptoms: Negative for: Fatigue; Fever; Chills; Marked Weight Change Respiratory Complaints and  Symptoms: Negative for: Chronic or frequent coughs; Shortness of Breath Medical History: Positive for: Asthma; Chronic Obstructive Pulmonary Disease (COPD) Negative for: Pneumothorax; Sleep Apnea; Tuberculosis Cardiovascular Complaints and Symptoms: Negative for: Chest pain; LE edema Medical History: Positive for: Arrhythmia - a-fib; Hypertension Negative for: Angina; Congestive Heart Failure; Coronary Artery Disease; Deep Vein Thrombosis; Hypotension; Myocardial Infarction; Peripheral Arterial Disease; Peripheral Venous Disease; Phlebitis; Vasculitis Psychiatric Complaints and Symptoms: Negative for: Anxiety; Claustrophobia Medical History: Negative for: Anorexia/bulimia; Confinement Anxiety Eyes Medical History: Positive for: Cataracts - surgery 2000;  Glaucoma Endocrine Medical History: Negative for: Type I Diabetes; Type II Diabetes Genitourinary Medical History: Negative for: End Stage Renal Disease [name], Kortnie H. ([id number]) Immunological Medical History: Negative for: Lupus Erythematosus; Raynaudos; Scleroderma Integumentary (Skin) Medical History: Negative for: History of Burn; History of pressure wounds Musculoskeletal Medical History: Negative for: Gout; Rheumatoid Arthritis; Osteoarthritis; Osteomyelitis Neurologic Medical History: Negative for: Dementia; Neuropathy; Quadriplegia; Paraplegia; Seizure Disorder Oncologic Medical History: Negative for: Received Chemotherapy; Received Radiation HBO Extended History Items Eyes: Eyes: Cataracts Glaucoma Immunizations Pneumococcal Vaccine: Received Pneumococcal Vaccination: Yes Implantable Devices None Hospitalization / Surgery History Type of Hospitalization/Surgery Colon cancer Family and Social History Cancer: Yes - Siblings,Paternal Grandparents; Diabetes: Yes - Siblings; Heart Disease: Yes - Mother,Father,Child,Paternal Grandparents; Hereditary Spherocytosis: No; Hypertension: Yes - Child; Kidney  Disease: No; Lung Disease: No; Seizures: No; Stroke: No; Thyroid Problems: No; Tuberculosis: No; Former smoker - quit 40 yrs ago; Marital Status - Divorced; Alcohol Use: Never; Drug Use: No History; Caffeine Use: Rarely; Financial Concerns: No; Food, Clothing or Shelter Needs: No; Support System Lacking: No; Transportation Concerns: No Physician Affirmation I have reviewed and agree with the above information. Electronic Signature(s) Signed: 06/09/2019 4:54:06 PM By: Montey Hora Signed: 06/09/2019 5:57:32 PM By: Worthy Keeler PA-C Entered By: Worthy Keeler on 06/09/2019 13:15:07 Funnell, Promiss HMarland Kitchen ([id number]) -------------------------------------------------------------------------------- SuperBill Details Patient Name: [name] Date of Service: 06/09/2019 Medical Record Number: [id number] Patient Account Number: [id number] Date of Birth/Sex: 05/30/1928 (85 y.o. F) Treating RN: Montey Hora Primary Care Provider: Park Liter Other Clinician: Referring Provider: Park Liter Treating Provider/Extender: [name], Kalil Woessner Weeks in Treatment: 13 Diagnosis Coding ICD-10 Codes Code Description I87.2 Venous insufficiency (chronic) (peripheral) S51.001A Unspecified open wound of right elbow, initial encounter Physician Procedures CPT4 Code: [id number] Description: [id number] - WC PHYS LEVEL 4 - EST PT ICD-10 Diagnosis Description I87.2 Venous insufficiency (chronic) (peripheral) S51.001A Unspecified open wound of right elbow, initial encounter Modifier: Quantity: 1 Electronic Signature(s) Signed: 06/09/2019 1:17:24 PM By: Worthy Keeler PA-C Entered By: Worthy Keeler on 06/09/2019 13:17:24

## 2019-06-10 NOTE — Progress Notes (Signed)
[name], [name] ([id number]) Visit Report for 06/09/2019 Arrival Information Details Patient Name: [name], [name]. Date of Service: 06/09/2019 1:00 PM Medical Record Number: [id number] Patient Account Number: [id number] Date of Birth/Sex: [birth year] (85 y.o. F) Treating RN: [name] Primary Care Coulton [name]: Park Liter Other Clinician: Referring Vollie Brunty: Park Liter Treating Jaslin Novitski/Extender: [name], [name] Weeks in Treatment: 72 Visit Information History Since Last Visit Added or deleted any medications: No Patient Arrived: Ambulatory Any new allergies or adverse reactions: No Arrival Time: 12:57 Had a fall or experienced change in No Accompanied By: self activities of daily living that may affect Transfer Assistance: None risk of falls: Patient Identification Verified: Yes Signs or symptoms of abuse/neglect since last visito No Patient Requires Transmission-Based No Hospitalized since last visit: No Precautions: Has Dressing in Place as Prescribed: Yes Patient Has Alerts: Yes Pain Present Now: No Patient Alerts: Patient on Blood Thinner 81mg  aspirin Electronic Signature(s) Signed: 06/10/2019 11:23:01 AM By: [name] Entered By: [name] on 06/09/2019 12:58:00 [name], [name] ([id number]) -------------------------------------------------------------------------------- Clinic Level of Care Assessment Details Patient Name: [name]. Date of Service: 06/09/2019 1:00 PM Medical Record Number: [id number] Patient Account Number: [id number] Date of Birth/Sex: 1928-08-11 (85 y.o. F) Treating RN: Montey Hora Primary Care Kaven Cumbie: Park Liter Other Clinician: Referring John Williamsen: Park Liter Treating Rory Xiang/Extender: [name], [name] Weeks in Treatment: 13 Clinic Level of Care Assessment Items TOOL 4 Quantity Score []  - Use when only an EandM is performed on FOLLOW-UP visit 0 ASSESSMENTS - Nursing Assessment / Reassessment X -  Reassessment of Co-morbidities (includes updates in patient status) 1 10 X- 1 5 Reassessment of Adherence to Treatment Plan ASSESSMENTS - Wound and Skin Assessment / Reassessment X - Simple Wound Assessment / Reassessment - one wound 1 5 []  - 0 Complex Wound Assessment / Reassessment - multiple wounds []  - 0 Dermatologic / Skin Assessment (not related to wound area) ASSESSMENTS - Focused Assessment []  - Circumferential Edema Measurements - multi extremities 0 []  - 0 Nutritional Assessment / Counseling / Intervention []  - 0 Lower Extremity Assessment (monofilament, tuning fork, pulses) []  - 0 Peripheral Arterial Disease Assessment (using hand held doppler) ASSESSMENTS - Ostomy and/or Continence Assessment and Care []  - Incontinence Assessment and Management 0 []  - 0 Ostomy Care Assessment and Management (repouching, etc.) PROCESS - Coordination of Care X - Simple Patient / Family Education for ongoing care 1 15 []  - 0 Complex (extensive) Patient / Family Education for ongoing care X- 1 10 Staff obtains Programmer, systems, Records, Test Results / Process Orders []  - 0 Staff telephones HHA, Nursing Homes / Clarify orders / etc []  - 0 Routine Transfer to another Facility (non-emergent condition) []  - 0 Routine Hospital Admission (non-emergent condition) []  - 0 New Admissions / Biomedical engineer / Ordering NPWT, Apligraf, etc. []  - 0 Emergency Hospital Admission (emergent condition) X- 1 10 Simple Discharge Coordination [name], [name] ([id number]) []  - 0 Complex (extensive) Discharge Coordination PROCESS - Special Needs []  - Pediatric / Minor Patient Management 0 []  - 0 Isolation Patient Management []  - 0 Hearing / Language / Visual special needs []  - 0 Assessment of Community assistance (transportation, D/C planning, etc.) []  - 0 Additional assistance / Altered mentation []  - 0 Support Surface(s) Assessment (bed, cushion, seat, etc.) INTERVENTIONS - Wound Cleansing  / Measurement X - Simple Wound Cleansing - one wound 1 5 []  - 0 Complex Wound Cleansing - multiple wounds X- 1 5 Wound Imaging (photographs - any number of wounds) []  -  0 Wound Tracing (instead of photographs) X- 1 5 Simple Wound Measurement - one wound []  - 0 Complex Wound Measurement - multiple wounds INTERVENTIONS - Wound Dressings X - Small Wound Dressing one or multiple wounds 1 10 []  - 0 Medium Wound Dressing one or multiple wounds []  - 0 Large Wound Dressing one or multiple wounds []  - 0 Application of Medications - topical []  - 0 Application of Medications - injection INTERVENTIONS - Miscellaneous []  - External ear exam 0 []  - 0 Specimen Collection (cultures, biopsies, blood, body fluids, etc.) []  - 0 Specimen(s) / Culture(s) sent or taken to Lab for analysis []  - 0 Patient Transfer (multiple staff / Civil Service fast streamer / Similar devices) []  - 0 Simple Staple / Suture removal (25 or less) []  - 0 Complex Staple / Suture removal (26 or more) []  - 0 Hypo / Hyperglycemic Management (close monitor of Blood Glucose) []  - 0 Ankle / Brachial Index (ABI) - do not check if billed separately X- 1 5 Vital Signs [name], [name] ([id number]) Has the patient been seen at the hospital within the last three years: Yes Total Score: 85 Level Of Care: New/Established - Level 3 Electronic Signature(s) Signed: 06/09/2019 4:54:06 PM By: Montey Hora Entered By: Montey Hora on 06/09/2019 13:59:32 [name], [name] ([id number]) -------------------------------------------------------------------------------- Encounter Discharge Information Details Patient Name: Carol Berger H. Date of Service: 06/09/2019 1:00 PM Medical Record Number: [id number] Patient Account Number: [id number] Date of Birth/Sex: [birth year] (85 y.o. F) Treating RN: Montey Hora Primary Care Loveda Colaizzi: Park Liter Other Clinician: Referring Aryah Doering: Park Liter Treating Tamesha Ellerbrock/Extender: [name], [name] Weeks in Treatment: 31 Encounter Discharge Information Items Discharge Condition: Stable Ambulatory Status: Ambulatory Discharge Destination: Home Transportation: Private Auto Accompanied By: self Schedule Follow-up Appointment: Yes Clinical Summary of Care: Electronic Signature(s) Signed: 06/09/2019 4:54:06 PM By: Montey Hora Entered By: Montey Hora on 06/09/2019 14:03:12 [name], [name] ([id number]) -------------------------------------------------------------------------------- Lower Extremity Assessment Details Patient Name: Deshaies, Fizza H. Date of Service: 06/09/2019 1:00 PM Medical Record Number: [id number] Patient Account Number: [id number] Date of Birth/Sex: September 20, 1927 (85 y.o. F) Treating RN: [name] Primary Care Phyllistine Domingos: Park Liter Other Clinician: Referring Neda Willenbring: Park Liter Treating Nadirah Socorro/Extender: [name], [name] Weeks in Treatment: 13 Electronic Signature(s) Signed: 06/10/2019 11:23:01 AM By: [name] Entered By: [name] on 06/09/2019 13:00:23 [name], [name] ([id number]) -------------------------------------------------------------------------------- Multi Wound Chart Details Patient Name: Duling, Mariadelosang H. Date of Service: 06/09/2019 1:00 PM Medical Record Number: [id number] Patient Account Number: [id number] Date of Birth/Sex: [birth year] (85 y.o. F) Treating RN: Montey Hora Primary Care Serenity Fortner: Park Liter Other Clinician: Referring Laura-Lee Villegas: Park Liter Treating Roshell Brigham/Extender: [name], [name] Weeks in Treatment: 13 Vital Signs Height(in): 60 Pulse(bpm): 23 Weight(lbs): 3 Blood Pressure(mmHg): 136/95 Body Mass Index(BMI): 18 Temperature(F): 98.3 Respiratory Rate 16 (breaths/min): Photos: [N/A:N/A] Wound Location: Right Elbow N/A N/A Wounding Event: Trauma N/A N/A Primary Etiology: Skin Tear N/A N/A Comorbid History: Cataracts, Glaucoma, Asthma, N/A N/A Chronic  Obstructive Pulmonary Disease (COPD), Arrhythmia, Hypertension Date Acquired: 05/11/2019 N/A N/A Weeks of Treatment: 3 N/A N/A Wound Status: Open N/A N/A Measurements L x W x D 5x3x0.1 N/A N/A (cm) Area (cm) : 11.781 N/A N/A Volume (cm) : 1.178 N/A N/A % Reduction in Area: -614.40% N/A N/A % Reduction in Volume: -613.90% N/A N/A Classification: Full Thickness Without N/A N/A Exposed Support Structures Exudate Amount: Medium N/A N/A Exudate Type: Serous N/A N/A Exudate Color: amber N/A N/A Wound Margin: Flat and Intact N/A N/A Granulation Amount: Large (67-100%) N/A N/A  Granulation Quality: Pink N/A N/A Necrotic Amount: None Present (0%) N/A N/A Exposed Structures: Fat Layer (Subcutaneous N/A N/A Tissue) Exposed: Yes Fascia: No Tendon: No [name], [name] ([id number]) Muscle: No Joint: No Bone: No Epithelialization: None N/A N/A Treatment Notes Electronic Signature(s) Signed: 06/09/2019 4:54:06 PM By: Montey Hora Entered By: Montey Hora on 06/09/2019 13:11:28 [name], [name] -------------------------------------------------------------------------------- Star Details Patient Name: [name]. Date of Service: 06/09/2019 1:00 PM Medical Record Number: [id number] Patient Account Number: [id number] Date of Birth/Sex: 02/16/1928 (85 y.o. F) Treating RN: Montey Hora Primary Care Galya Dunnigan: Park Liter Other Clinician: Referring Delores Edelstein: Park Liter Treating Gael Delude/Extender: [name], [name] Weeks in Treatment: 80 Active Inactive Abuse / Safety / Falls / Self Care Management Nursing Diagnoses: Potential for falls Goals: Patient will not experience any injury related to falls Date Initiated: 03/10/2019 Target Resolution Date: 06/13/2019 Goal Status: Active Interventions: Assess fall risk on admission and as needed Notes: Orientation to the Wound Care Program Nursing Diagnoses: Knowledge deficit related  to the wound healing center program Goals: Patient/caregiver will verbalize understanding of the [facility] Date Initiated: 03/10/2019 Target Resolution Date: 06/13/2019 Goal Status: Active Interventions: Provide education on orientation to the wound center Notes: Wound/Skin Impairment Nursing Diagnoses: Impaired tissue integrity Goals: Ulcer/skin breakdown will heal within 14 weeks Date Initiated: 03/10/2019 Target Resolution Date: 06/13/2019 Goal Status: Active Interventions: Assess patient/caregiver ability to obtain necessary supplies [name], [name] ([id number]) Assess patient/caregiver ability to perform ulcer/skin care regimen upon admission and as needed Assess ulceration(s) every visit Notes: Electronic Signature(s) Signed: 06/09/2019 4:54:06 PM By: Montey Hora Entered By: Montey Hora on 06/09/2019 13:11:20 [name], [name] H. ([id number]) -------------------------------------------------------------------------------- Pain Assessment Details Patient Name: [name]. Date of Service: 06/09/2019 1:00 PM Medical Record Number: [id number] Patient Account Number: [id number] Date of Birth/Sex: 08-Jan-1928 (85 y.o. F) Treating RN: [name] Primary Care Bernie Fobes: Park Liter Other Clinician: Referring Javar Eshbach: Park Liter Treating Zeven Kocak/Extender: [name], [name] Weeks in Treatment: 13 Active Problems Location of Pain Severity and Description of Pain Patient Has Paino No Site Locations Pain Management and Medication Current Pain Management: Electronic Signature(s) Signed: 06/10/2019 11:23:01 AM By: [name] Entered By: [name] on 06/09/2019 12:58:06 Riddell, [name] -------------------------------------------------------------------------------- Patient/Caregiver Education Details Patient Name: [name]. Date of Service: 06/09/2019 1:00 PM Medical Record Number: [id number] Patient  Account Number: [id number] Date of Birth/Gender: [birth year] (85 y.o. F) Treating RN: Montey Hora Primary Care Physician: Park Liter Other Clinician: Referring Physician: Park Liter Treating Physician/Extender: Sharalyn Ink in Treatment: 13 Education Assessment Education Provided To: Patient Education Topics Provided Wound/Skin Impairment: Handouts: Other: wound care as ordered Methods: Demonstration, Explain/Verbal Responses: State content correctly Electronic Signature(s) Signed: 06/09/2019 4:54:06 PM By: Montey Hora Entered By: Montey Hora on 06/09/2019 14:00:54 Fullington, Jazmaine H. ([id number]) -------------------------------------------------------------------------------- Wound Assessment Details Patient Name: Holsclaw, Coralee H. Date of Service: 06/09/2019 1:00 PM Medical Record Number: [id number] Patient Account Number: [id number] Date of Birth/Sex: Jan 23, 1928 (85 y.o. F) Treating RN: [name] Primary Care Francisco Eyerly: Park Liter Other Clinician: Referring Zyona Pettaway: Park Liter Treating Samariya Rockhold/Extender: [name], [name] Weeks in Treatment: 13 Wound Status Wound Number: 16 Primary Skin Tear Etiology: Wound Location: Right Elbow Wound Open Wounding Event: Trauma Status: Date Acquired: 05/11/2019 Comorbid Cataracts, Glaucoma, Asthma, Chronic Weeks Of Treatment: 3 History: Obstructive Pulmonary Disease (COPD), Clustered Wound: No Arrhythmia, Hypertension Photos Wound Measurements Length: (cm) 5 Width: (cm) 3 Depth: (cm) 0.1 Area: (cm) 11.781 Volume: (cm) 1.178 % Reduction in Area: -  614.4% % Reduction in Volume: -613.9% Epithelialization: None Tunneling: No Undermining: No Wound Description Full Thickness Without Exposed Support Foul Odo Classification: Structures Slough/F Wound Margin: Flat and Intact Exudate Medium Amount: Exudate Type: Serous Exudate Color: amber r After Cleansing: No ibrino Yes Wound  Bed Granulation Amount: Large (67-100%) Exposed Structure Granulation Quality: Pink Fascia Exposed: No Necrotic Amount: None Present (0%) Fat Layer (Subcutaneous Tissue) Exposed: Yes Tendon Exposed: No Muscle Exposed: No Joint Exposed: No Bone Exposed: No Offord, Aaylah H. ([id number]) Treatment Notes Wound #16 (Right Elbow) Notes nystatin, ABD, conform, stretch net Electronic Signature(s) Signed: 06/10/2019 11:23:01 AM By: [name] Entered By: [name] on 06/09/2019 13:00:07 Degracia, [name] ([id number]) -------------------------------------------------------------------------------- Vitals Details Patient Name: Carol Berger H. Date of Service: 06/09/2019 1:00 PM Medical Record Number: [id number] Patient Account Number: [id number] Date of Birth/Sex: 03-Sep-1928 (85 y.o. F) Treating RN: [name] Primary Care Taj Arteaga: Park Liter Other Clinician: Referring Kalea Perine: Park Liter Treating Odysseus Cada/Extender: [name], [name] Weeks in Treatment: 13 Vital Signs Time Taken: 12:58 Temperature (F): 98.3 Height (in): 60 Pulse (bpm): 53 Weight (lbs): 92 Respiratory Rate (breaths/min): 16 Body Mass Index (BMI): 18 Blood Pressure (mmHg): 136/95 Reference Range: 80 - 120 mg / dl Electronic Signature(s) Signed: 06/10/2019 11:23:01 AM By: [name] Entered By: [name] on 06/09/2019 12:58:51

## 2019-06-13 LAB — AEROBIC CULTURE W GRAM STAIN (SUPERFICIAL SPECIMEN)

## 2019-06-16 ENCOUNTER — Encounter: Payer: Medicare Other | Admitting: Physician Assistant

## 2019-06-18 ENCOUNTER — Encounter: Payer: Medicare Other | Admitting: Physician Assistant

## 2019-06-18 ENCOUNTER — Other Ambulatory Visit: Payer: Self-pay

## 2019-06-18 DIAGNOSIS — L97812 Non-pressure chronic ulcer of other part of right lower leg with fat layer exposed: Secondary | ICD-10-CM | POA: Diagnosis not present

## 2019-06-18 DIAGNOSIS — I10 Essential (primary) hypertension: Secondary | ICD-10-CM | POA: Diagnosis not present

## 2019-06-18 DIAGNOSIS — S81802A Unspecified open wound, left lower leg, initial encounter: Secondary | ICD-10-CM | POA: Diagnosis not present

## 2019-06-18 DIAGNOSIS — Z87891 Personal history of nicotine dependence: Secondary | ICD-10-CM | POA: Diagnosis not present

## 2019-06-18 DIAGNOSIS — S51811A Laceration without foreign body of right forearm, initial encounter: Secondary | ICD-10-CM | POA: Diagnosis not present

## 2019-06-18 DIAGNOSIS — S51011A Laceration without foreign body of right elbow, initial encounter: Secondary | ICD-10-CM | POA: Diagnosis not present

## 2019-06-18 DIAGNOSIS — L98492 Non-pressure chronic ulcer of skin of other sites with fat layer exposed: Secondary | ICD-10-CM | POA: Diagnosis not present

## 2019-06-18 DIAGNOSIS — I872 Venous insufficiency (chronic) (peripheral): Secondary | ICD-10-CM | POA: Diagnosis not present

## 2019-06-18 DIAGNOSIS — I4891 Unspecified atrial fibrillation: Secondary | ICD-10-CM | POA: Diagnosis not present

## 2019-06-18 DIAGNOSIS — J449 Chronic obstructive pulmonary disease, unspecified: Secondary | ICD-10-CM | POA: Diagnosis not present

## 2019-06-18 NOTE — Progress Notes (Addendum)
[name] Visit Report for 06/18/2019 Arrival Information Details Patient Name: [name], [name]. Date of Service: 06/18/2019 2:45 PM Medical Record Number: [id number] Patient Account Number: [id number] Date of Birth/Sex: [birth year] (85 y.o. F) Treating RN: Harold Barban Primary Care Abigaelle Verley: Park Liter Other Clinician: Referring Khia Dieterich: Park Liter Treating Lajeana Strough/Extender: [name], [name] in Treatment: 14 Visit Information History Since Last Visit Added or deleted any medications: No Patient Arrived: Ambulatory Any new allergies or adverse reactions: No Arrival Time: 14:46 Had a fall or experienced change in No Accompanied By: self activities of daily living that may affect Transfer Assistance: None risk of falls: Patient Identification Verified: Yes Signs or symptoms of abuse/neglect since last visito No Secondary Verification Process Yes Hospitalized since last visit: No Completed: Has Dressing in Place as Prescribed: Yes Patient Requires Transmission-Based No Pain Present Now: No Precautions: Patient Has Alerts: Yes Patient Alerts: Patient on Blood Thinner 81mg  aspirin Electronic Signature(s) Signed: 06/18/2019 4:37:29 PM By: Harold Barban Entered By: Harold Barban on 06/18/2019 14:47:16 Smock, [name] -------------------------------------------------------------------------------- Clinic Level of Care Assessment Details Patient Name: [name]. Date of Service: 06/18/2019 2:45 PM Medical Record Number: [id number] Patient Account Number: [id number] Date of Birth/Sex: [birth year] (85 y.o. F) Treating RN: [name] Barman Primary Care Breckyn Troyer: Park Liter Other Clinician: Referring Amalia Edgecombe: Park Liter Treating Dann Ventress/Extender: [name], [name] in Treatment: 14 Clinic Level of Care Assessment Items TOOL 4 Quantity Score []  - Use when only an EandM is performed on FOLLOW-UP visit  0 ASSESSMENTS - Nursing Assessment / Reassessment []  - Reassessment of Co-morbidities (includes updates in patient status) 0 X- 1 5 Reassessment of Adherence to Treatment Plan ASSESSMENTS - Wound and Skin Assessment / Reassessment X - Simple Wound Assessment / Reassessment - one wound 1 5 []  - 0 Complex Wound Assessment / Reassessment - multiple wounds []  - 0 Dermatologic / Skin Assessment (not related to wound area) ASSESSMENTS - Focused Assessment []  - Circumferential Edema Measurements - multi extremities 0 []  - 0 Nutritional Assessment / Counseling / Intervention []  - 0 Lower Extremity Assessment (monofilament, tuning fork, pulses) []  - 0 Peripheral Arterial Disease Assessment (using hand held doppler) ASSESSMENTS - Ostomy and/or Continence Assessment and Care []  - Incontinence Assessment and Management 0 []  - 0 Ostomy Care Assessment and Management (repouching, etc.) PROCESS - Coordination of Care X - Simple Patient / Family Education for ongoing care 1 15 []  - 0 Complex (extensive) Patient / Family Education for ongoing care []  - 0 Staff obtains Programmer, systems, Records, Test Results / Process Orders []  - 0 Staff telephones HHA, Nursing Homes / Clarify orders / etc []  - 0 Routine Transfer to another Facility (non-emergent condition) []  - 0 Routine Hospital Admission (non-emergent condition) []  - 0 New Admissions / Biomedical engineer / Ordering NPWT, Apligraf, etc. []  - 0 Emergency Hospital Admission (emergent condition) X- 1 10 Simple Discharge Coordination Wise, [name] ([id number]) []  - 0 Complex (extensive) Discharge Coordination PROCESS - Special Needs []  - Pediatric / Minor Patient Management 0 []  - 0 Isolation Patient Management []  - 0 Hearing / Language / Visual special needs []  - 0 Assessment of Community assistance (transportation, D/C planning, etc.) []  - 0 Additional assistance / Altered mentation []  - 0 Support Surface(s) Assessment (bed,  cushion, seat, etc.) INTERVENTIONS - Wound Cleansing / Measurement X - Simple Wound Cleansing - one wound 1 5 []  - 0 Complex Wound Cleansing - multiple wounds X- 1 5 Wound Imaging (photographs - any  number of wounds) []  - 0 Wound Tracing (instead of photographs) X- 1 5 Simple Wound Measurement - one wound []  - 0 Complex Wound Measurement - multiple wounds INTERVENTIONS - Wound Dressings []  - Small Wound Dressing one or multiple wounds 0 X- 1 15 Medium Wound Dressing one or multiple wounds []  - 0 Large Wound Dressing one or multiple wounds []  - 0 Application of Medications - topical []  - 0 Application of Medications - injection INTERVENTIONS - Miscellaneous []  - External ear exam 0 []  - 0 Specimen Collection (cultures, biopsies, blood, body fluids, etc.) []  - 0 Specimen(s) / Culture(s) sent or taken to Lab for analysis []  - 0 Patient Transfer (multiple staff / Civil Service fast streamer / Similar devices) []  - 0 Simple Staple / Suture removal (25 or less) []  - 0 Complex Staple / Suture removal (26 or more) []  - 0 Hypo / Hyperglycemic Management (close monitor of Blood Glucose) []  - 0 Ankle / Brachial Index (ABI) - do not check if billed separately X- 1 5 Vital Signs Wise, [name] ([id number]) Has the patient been seen at the hospital within the last three years: Yes Total Score: 70 Level Of Care: New/Established - Level 2 Electronic Signature(s) Signed: 06/18/2019 5:55:45 PM By: Gretta Cool, BSN, RN, CWS, Kim RN, BSN Entered By: Gretta Cool, BSN, RN, CWS, Kim on 06/18/2019 15:21:43 [name] -------------------------------------------------------------------------------- Encounter Discharge Information Details Patient Name: Wise, [name] Date of Service: 06/18/2019 2:45 PM Medical Record Number: [id number] Patient Account Number: [id number] Date of Birth/Sex: Sep 18, 1927 (85 y.o. F) Treating RN: [name] Barman Primary Care Husna Krone: Park Liter Other  Clinician: Referring Chasitty Hehl: Park Liter Treating Quinnten Calvin/Extender: [name], [name] in Treatment: 14 Encounter Discharge Information Items Discharge Condition: Stable Ambulatory Status: Ambulatory Discharge Destination: Home Transportation: Private Auto Accompanied By: self Schedule Follow-up Appointment: Yes Clinical Summary of Care: Electronic Signature(s) Signed: 06/18/2019 3:45:45 PM By: Gretta Cool, BSN, RN, CWS, Kim RN, BSN Entered By: Gretta Cool, BSN, RN, CWS, Kim on 06/18/2019 15:45:45 MELLANIE, ALTSCHULER ([id number]) -------------------------------------------------------------------------------- Lower Extremity Assessment Details Patient Name: Thorp, Tye H. Date of Service: 06/18/2019 2:45 PM Medical Record Number: [id number] Patient Account Number: [id number] Date of Birth/Sex: 1928-06-30 (85 y.o. F) Treating RN: Harold Barban Primary Care Guinevere Stephenson: Park Liter Other Clinician: Referring Lisandra Mathisen: Park Liter Treating Abrea Henle/Extender: [name], [name] in Treatment: 14 Notes Wound on elbow, right Electronic Signature(s) Signed: 06/18/2019 4:37:29 PM By: Harold Barban Entered By: Harold Barban on 06/18/2019 14:53:55 Fesler, Nadene H. ([id number]) -------------------------------------------------------------------------------- Multi Wound Chart Details Patient Name: Wise, [name] Date of Service: 06/18/2019 2:45 PM Medical Record Number: [id number] Patient Account Number: [id number] Date of Birth/Sex: [birth year] (85 y.o. F) Treating RN: [name] Barman Primary Care Ambrea Hegler: Park Liter Other Clinician: Referring Ziquan Fidel: Park Liter Treating Okema Rollinson/Extender: [name], [name] in Treatment: 14 Vital Signs Height(in): 60 Pulse(bpm): 72 Weight(lbs): 95 Blood Pressure(mmHg): 147/48 Body Mass Index(BMI): 18 Temperature(F): 98.2 Respiratory Rate 18 (breaths/min): Photos: [N/A:N/A] Wound Location: Right Elbow N/A  N/A Wounding Event: Trauma N/A N/A Primary Etiology: Skin Tear N/A N/A Comorbid History: Cataracts, Glaucoma, Asthma, N/A N/A Chronic Obstructive Pulmonary Disease (COPD), Arrhythmia, Hypertension Date Acquired: 05/11/2019 N/A N/A Wise of Treatment: 5 N/A N/A Wound Status: Open N/A N/A Measurements L x W x D 1x1x0.1 N/A N/A (cm) Area (cm) : 0.785 N/A N/A Volume (cm) : 0.079 N/A N/A % Reduction in Area: 52.40% N/A N/A % Reduction in Volume: 52.10% N/A N/A Classification: Full Thickness Without N/A N/A Exposed Support Structures Exudate Amount: Medium  N/A N/A Exudate Type: Serous N/A N/A Exudate Color: amber N/A N/A Wound Margin: Flat and Intact N/A N/A Granulation Amount: Large (67-100%) N/A N/A Granulation Quality: Pink N/A N/A Necrotic Amount: None Present (0%) N/A N/A Exposed Structures: Fat Layer (Subcutaneous N/A N/A Tissue) Exposed: Yes Fascia: No Tendon: No Lemmerman, Liese H. ([name]) Muscle: No Joint: No Bone: No Epithelialization: None N/A N/A Treatment Notes Electronic Signature(s) Signed: 06/18/2019 5:55:45 PM By: Gretta Cool, BSN, RN, CWS, Kim RN, BSN Entered By: Gretta Cool, BSN, RN, CWS, Kim on 06/18/2019 15:20:54 ABGAIL, VILORIO ([name]) -------------------------------------------------------------------------------- Multi-Disciplinary Care Plan Details Patient Name: Carol Games. Date of Service: 06/18/2019 2:45 PM Medical Record Number: [name] Patient Account Number: [id number] Date of Birth/Sex: 25-Apr-1928 (85 y.o. F) Treating RN: [name] Barman Primary Care Kaliana Albino: Park Liter Other Clinician: Referring Kurstyn Larios: Park Liter Treating Riker Collier/Extender: [name], [name] in Treatment: 32 Active Inactive Abuse / Safety / Falls / Self Care Management Nursing Diagnoses: Potential for falls Goals: Patient will not experience any injury related to falls Date Initiated: 03/10/2019 Target Resolution Date: 06/13/2019 Goal Status:  Active Interventions: Assess fall risk on admission and as needed Notes: Orientation to the Wound Care Program Nursing Diagnoses: Knowledge deficit related to the wound healing center program Goals: Patient/caregiver will verbalize understanding of the [place] Program Date Initiated: 03/10/2019 Target Resolution Date: 06/13/2019 Goal Status: Active Interventions: Provide education on orientation to the wound center Notes: Wound/Skin Impairment Nursing Diagnoses: Impaired tissue integrity Goals: Ulcer/skin breakdown will heal within 14 Wise Date Initiated: 03/10/2019 Target Resolution Date: 06/13/2019 Goal Status: Active Interventions: Assess patient/caregiver ability to obtain necessary supplies [name], [name] ([name]) Assess patient/caregiver ability to perform ulcer/skin care regimen upon admission and as needed Assess ulceration(s) every visit Notes: Electronic Signature(s) Signed: 06/18/2019 5:55:45 PM By: Gretta Cool, BSN, RN, CWS, Kim RN, BSN Entered By: Gretta Cool, BSN, RN, CWS, Kim on 06/18/2019 15:20:45 [name], [name] ([name]) -------------------------------------------------------------------------------- Pain Assessment Details Patient Name: [name] Date of Service: 06/18/2019 2:45 PM Medical Record Number: [name] Patient Account Number: [id number] Date of Birth/Sex: August 13, 1928 (85 y.o. F) Treating RN: Harold Barban Primary Care Lis Savitt: Park Liter Other Clinician: Referring Princess Karnes: Park Liter Treating Junelle Hashemi/Extender: [name], [name] in Treatment: 14 Active Problems Location of Pain Severity and Description of Pain Patient Has Paino No Site Locations Pain Management and Medication Current Pain Management: Electronic Signature(s) Signed: 06/18/2019 4:37:29 PM By: Harold Barban Entered By: Harold Barban on 06/18/2019 14:47:22 [place], [name]  ([name]) -------------------------------------------------------------------------------- Patient/Caregiver Education Details Patient Name: Carol Games. Date of Service: 06/18/2019 2:45 PM Medical Record Number: [name] Patient Account Number: [id number] Date of Birth/Gender: 1928-01-21 (85 y.o. F) Treating RN: [name] Barman Primary Care Physician: Park Liter Other Clinician: Referring Physician: Park Liter Treating Physician/Extender: Sharalyn Ink in Treatment: 14 Education Assessment Education Provided To: Patient Education Topics Provided Wound/Skin Impairment: Handouts: Caring for Your Ulcer, Other: wound care as prescribed Methods: Demonstration Responses: State content correctly Electronic Signature(s) Signed: 06/18/2019 5:55:45 PM By: Gretta Cool, BSN, RN, CWS, Kim RN, BSN Entered By: Gretta Cool, BSN, RN, CWS, Kim on 06/18/2019 15:22:22 [name], [name] ([name]) -------------------------------------------------------------------------------- Wound Assessment Details Patient Name: Klasen, Ala H. Date of Service: 06/18/2019 2:45 PM Medical Record Number: [name] Patient Account Number: [id number] Date of Birth/Sex: 28-Sep-1927 (85 y.o. F) Treating RN: Harold Barban Primary Care Qualyn Oyervides: Park Liter Other Clinician: Referring Nayda Riesen: Park Liter Treating Maile Linford/Extender: [name], [name] in Treatment: 14 Wound Status Wound Number: 16 Primary Skin Tear Etiology: Wound Location:  Right Elbow Wound Open Wounding Event: Trauma Status: Date Acquired: 05/11/2019 Comorbid Cataracts, Glaucoma, Asthma, Chronic Wise Of Treatment: 5 History: Obstructive Pulmonary Disease (COPD), Clustered Wound: No Arrhythmia, Hypertension Photos Wound Measurements Length: (cm) 1 Width: (cm) 1 Depth: (cm) 0.1 Area: (cm) 0.785 Volume: (cm) 0.079 % Reduction in Area: 52.4% % Reduction in Volume: 52.1% Epithelialization: None Tunneling:  No Undermining: No Wound Description Full Thickness Without Exposed Support Foul Odo Classification: Structures Slough/F Wound Margin: Flat and Intact Exudate Medium Amount: Exudate Type: Serous Exudate Color: amber r After Cleansing: No ibrino Yes Wound Bed Granulation Amount: Large (67-100%) Exposed Structure Granulation Quality: Pink Fascia Exposed: No Necrotic Amount: None Present (0%) Fat Layer (Subcutaneous Tissue) Exposed: Yes Tendon Exposed: No Muscle Exposed: No Joint Exposed: No Bone Exposed: No Skolnik, Sierra H. ([id number]) Treatment Notes Wound #16 (Right Elbow) Notes nystatin, ABD, conform, stretch net Electronic Signature(s) Signed: 06/18/2019 4:37:29 PM By: Harold Barban Entered By: Harold Barban on 06/18/2019 14:52:17 Anaya, Sharyn H. ([id number]) -------------------------------------------------------------------------------- Vitals Details Patient Name: [name] Date of Service: 06/18/2019 2:45 PM Medical Record Number: [id number] Patient Account Number: [id number] Date of Birth/Sex: 1928/01/11 (85 y.o. F) Treating RN: Harold Barban Primary Care Ayme Short: Park Liter Other Clinician: Referring Tafari Humiston: Park Liter Treating Hazleigh Mccleave/Extender: [name], [name] in Treatment: 14 Vital Signs Time Taken: 12:45 Temperature (F): 98.2 Height (in): 60 Pulse (bpm): 56 Weight (lbs): 92 Respiratory Rate (breaths/min): 18 Body Mass Index (BMI): 18 Blood Pressure (mmHg): 147/48 Reference Range: 80 - 120 mg / dl Electronic Signature(s) Signed: 06/18/2019 4:37:29 PM By: Harold Barban Entered By: Harold Barban on 06/18/2019 14:48:28

## 2019-06-18 NOTE — Progress Notes (Addendum)
DARRA, JANOSKY ([id number]) Visit Report for 06/18/2019 Chief Complaint Document Details Patient Name: [name], [name]. Date of Service: 06/18/2019 2:45 PM Medical Record Number: [id number] Patient Account Number: [id number] Date of Birth/Sex: [birth year] (85 y.o. F) Treating RN: [name] Barman Primary Care Provider: Park Liter Other Clinician: Referring Provider: Park Liter Treating Provider/Extender: [name], [name] Weeks in Treatment: 14 Information Obtained from: Patient Chief Complaint Right elbow skin tear Electronic Signature(s) Signed: 06/18/2019 2:38:22 PM By: Worthy Keeler PA-C Entered By: Worthy Keeler on 06/18/2019 14:38:22 [name], [name] ([id number]) -------------------------------------------------------------------------------- HPI Details Patient Name: [name]. Date of Service: 06/18/2019 2:45 PM Medical Record Number: [id number] Patient Account Number: [id number] Date of Birth/Sex: 02-09-1928 (85 y.o. F) Treating RN: [name] Barman Primary Care Provider: Park Liter Other Clinician: Referring Provider: Park Liter Treating Provider/Extender: [name], [name] Weeks in Treatment: 14 History of Present Illness HPI Description: 02/12/18 ADMISSION This is an 85 year old woman who is recently moved to [place] from [place]. Her story began in February where she fell on some ice suffering extensive lacerations to her bilateral lower extremities. She is able to show me extensive pictures of the right lower leg but with going to a wound care center and [place] 3 times a week these eventually closed over. She has been left with 1 area on the upper lateral left calf. She has been applying Neosporin to this and applying a bandage. Currently this measures 2 x 1.5 cm. The patient is not a diabetic. She is an ex-smoker quitting 40 years ago. She does have COPD by description. ABIs in our clinic were 1.32 on the right and 1.03 on the  left 02/19/18; right lower leg wound which was initially trauma. The area that we look that last week is smaller. Still covered in a nonviable surface however. We are using Iodoflex 02/26/18; right lower leg wound which was initially trauma in the setting of chronic venous insufficiency. Surface of the wound looks much better healthy granulation advancing epithelialization. We have good edema control 03/05/18; right lower leg wound which was initially trauma in the setting of chronic venous insufficiency. She continues to make nice progress here. We have good edema control oHe arrives today with a new traumatic laceration on her right dorsal forearm which she states happened while she was putting on a sweatshirt 03/12/18; right lower leg wound as closed however it still looks vulnerable. She has chronic venous insufficiency. oThe new wound from last week a traumatic area on her right dorsal or arm unfortunately does not have a viable surface. I remove some nonviable skin and necrotic subcutaneous debris from the wound surface. Hemostasis with silver nitrate and direct pressure 03/19/18; right lower leg wound is closed. She has chronic venous insufficiency and will need ongoing compression oThe new wounds from 2 weeks ago on the right upper elbow is closed. The area on her right dorsal forearm continues to have a nonviable surface requiring debridement 03/26/18; we'll need to look into ongoing compression for her legs. 2 wounds on the right upper elbow is closed. The area on the right dorsal forearm looks better. Hydrofera Blue secondary to hypertrophic granulation 04/02/18; he has compression stockings although she did not wear them today. Severe chronic venous insufficiency. She has 2 wounds on the right upper elbow which I said were closed last week which actually are although they're very small and she has a smaller clean wound on the right dorsal forearm. We've been using Hydrofera Blue secondary to  some  upper granulation. All of this looks better 04/09/18-She is seen in follow up evaluation for a right elbow and right forearm skin tear. The right elbow is healed. We will continue with same treatment plan and she'll follow next week 04/16/18-She is seen in follow-up evaluation for right forearm skin tear, this is essentially healed. We will cover with foam border and she will follow-up next week 04/23/18; the patient arrives with a new skin tear on her right dorsal arm just below the elbow. She got this while carrying a box. She has a linear skin tear. There is no depth I don't think this should've been sutured. There is a skin flap medially I'm not sure if this will be viable or not 04/30/18; the new skin tear from last week unfortunately has remained viable in terms of the skin flap. She has a small open area that looks healthy. Using moistened silver collagen 05/07/18; right dorsal arm skin tear. Nonviable tissue over the remnants of the wound. We've been using silver collagen 05/21/18; right dorsal arm skin tear. Nonviable tissue over a small remnant of the wound was washed off. We've been using silver collagen which we will continue 06/04/18; the right dorsal arm skin tear has healed. She arrives today with a traumatic wound just above the olecranon of her left elbow. This is a skin tear with a nonviable nonadherent flap which was removed. We'll use silver collagen here. Also noteworthy she arrives without her compression stockings 06/18/18; the area just above the olecranon of her left elbow. It is smaller and generally has a healthy surface. I was surprised [name], [name] ([id number]) to learn that the patient is actually changing this herself although. Appears this week she was putting some topical lidocaine on this that she bought over-the-counter at the drugstore. She did get supplies at home she didn't know how to put it on. She drove herself here today i.e. not eligible for home  health Readmission: 11/04/18 on evaluation today patient presents for evaluation our clinic news to include the she has on her lower extremity which occurred as the result of having hit this on a piece of furniture. This was back in December. She states that her daughter has been trying to get her to come into have this evaluated here the wound care center but she was being somewhat stubborn. Nonetheless upon evaluation today the wound really appears to be potentially healed she does have some swelling of lower extremity which I think will come into play. With that being said he tells me that even yesterday this was still making clear fluid and therefore there may be some issues with continued problems with the swelling and drainage secondary to her venous stasis. Nonetheless overall wound appears to be doing fairly well which is good news. 11/11/18 on evaluation today patient actually appears to be doing excellent in the area of question that was green just the day before he saw her last time actually appears to have not drain that always in past week. I think this is done very well and at this point though she's had some discomfort I do not see any evidence of open wound which he needs to continue see wound care. Readmission: 03/10/19 on evaluation today patient presents for reevaluation here in our clinic concerning issues that she actually is having with the new problem on her right lower leg due to trauma that occurred when a car door shut on her leg last Thursday. This is not been quite a  week and she does have several skin tears unfortunately that resulted from this injury. Fortunately there's no signs of active infection at this time. No fevers, chills, nausea, or vomiting noted at this time. She does have some hot tissue noted at this point that has me concerned about the possibility of needing to move this in order to allow the areas to heal appropriately. Fortunately there's no signs of  active infection at this time. 03/17/19-Patient returns to clinic at 1 week, we have been using Prisma on the larger wounds with Kerlix and Xeroform. As many as 5 wounds were number last time 3 of which have closed, leaving the right proximal tibial and right distal medial leg wound both of which are looking good 03/24/19 on evaluation today patient appears to be doing better in regard to her wounds at this time. Fortunately there is no sign of active infection at this time. No fevers chills noted. She's been tolerating the dressing changes without complication which is good news she's been performing these herself it sounds like home health is not going to be coming out it sounds as if they feel like she may be able to take care of yourself and not be technically homebound. Nonetheless though that is unfortunate it does seem like she's done fairly well with taking care of yourself from the standpoint of the dressings. 03/31/19 on evaluation today patient actually appears to be doing well with regard to her lower extremity ulcers. She had one area to heal today and in regard to her right lower extremity were she continues to have an open wound I did have to perform some sharp debridement today. Subsequently the patient still has an area of what appears to be possibly a small hematoma on the anterior portion of her left lower extremity. This is not open enough at this time but nonetheless does seem to show some evidence of hardening which is not necessarily a bad thing. Eventually this may pop off but again I'm not interested in really removing this prematurely. 04/07/19 on evaluation today patient actually appears to be doing well with regard to her lower extremity ulcers. She is been tolerating the dressing changes without complication. She seems to be making excellent progress which is great news. She does have one small new skin tear area but the good news is she was able to pull the skin back over  and it really looks like it is healing quite nicely. Overall that is just a very small region and again seems to be healing nicely. 04/14/19 upon evaluation today patient actually appears to be doing quite well with regard to the wounds on the right lower extremity. In fact everything appears to be healed as of today. This is excellent news. She does however have the area on the left lower extremity which is showing some signs of loosening up we have been watching this area it is no longer fluctuant and in fact with the Betadine on it it is been slowly dry now but is now lifting up on the edge. There may be a wound underneath this region. 04/21/2019 on evaluation today patient appears to be doing excellent in regard to her bilateral lower extremities. In fact she has been tolerating the dressing changes without complication does not seem to show any signs of infection at this point. In fact based on what I am seeing currently I think she is completely healed. LILIKA, TRIMNAL ([id number]) 05/04/2019 on evaluation today patient actually appears to be  doing quite well with regard to her right elbow. She has been tolerating the dressing changes without complication. Fortunately there is no signs of active infection at this time. Overall I am very pleased with how things are progressing. The elbow almost appears to be completely healed though it is not 100% today. 05/14/2019 on evaluation today patient actually appears to be doing well with regard to her elbow skin tear that we have been treating in fact this appears to be completely healed. Unfortunately she has a new skin tear noted as of today which actually occurred in the last week since I previously saw her. This again is unfortunate that it has been so long because I am not going to be able to actually pull the skin over where you have to trim this away and let it heal by second intent. This new wound is in the right elbow region as well but  again separate from the original ulcer. 05/19/2019 on evaluation today patient appears to be doing well with regard to her skin tear on her elbow. She has been tolerating the dressing changes without complication. Fortunately there is no signs of active infection at this time. No fever chills noted. Overall I am very pleased with how things appear today. She is not having any significant pain which is also excellent news. 05/26/2019 on evaluation today patient actually appears to be doing well with regard to her right elbow ulcer. She has been tolerating the dressing changes without complication. Fortunately there is no signs of active infection at this time. No fevers, chills, nausea, vomiting, or diarrhea. 06/02/2019 on evaluation today patient appears to be doing very well with regard to her wound of the right elbow. In fact there is just a very small opening still remaining for the most part this is completely closed and appears to be doing excellent. There is no signs of active infection at this time which is also good news. No fevers, chills, nausea, vomiting, or diarrhea. 06/09/2019 on evaluation today patient appears to actually be doing excellent with regard to her original wound on her elbow which really appears to be completely healed at this point. With that being said she is having issues with an area generally of weeping in this region which I am unsure exactly what is causing this. Does not appear to be obviously bacterially infected although that could be part of the issue. With that being said I do believe that there is also a chance there could be a fungal infection here. 06/18/2019 on evaluation today patient's elbow ulcer appears to be doing much better I do believe this was more likely a fungal infection that was going on at the last visit and fortunately she seems to be doing much better at this point. With that being said this is not completely healed but I am hopeful will be so  by next week. She is very pleased that is really not having any pain in regard to the elbow. Electronic Signature(s) Signed: 06/18/2019 6:15:37 PM By: Worthy Keeler PA-C Entered By: Worthy Keeler on 06/18/2019 18:15:37 [name], [name] ([id number]) -------------------------------------------------------------------------------- Physical Exam Details Patient Name: Mangus, Khira H. Date of Service: 06/18/2019 2:45 PM Medical Record Number: [id number] Patient Account Number: [id number] Date of Birth/Sex: 1928-03-27 (85 y.o. F) Treating RN: [name] Barman Primary Care Provider: Park Liter Other Clinician: Referring Provider: Park Liter Treating Provider/Extender: [name], [name] Weeks in Treatment: 23 Constitutional Well-nourished and well-hydrated in no acute distress. Respiratory normal breathing  without difficulty. clear to auscultation bilaterally. Cardiovascular regular rate and rhythm with normal S1, S2. Psychiatric this patient is able to make decisions and demonstrates good insight into disease process. Alert and Oriented x 3. pleasant and cooperative. Notes Just has a very small open area at this time based on what I am seeing does not appear to be any evidence of active infection which is good news and overall I am pleased with the progress though not completely healed she is much closer than during the last evaluation. Electronic Signature(s) Signed: 06/18/2019 6:16:04 PM By: Worthy Keeler PA-C Entered By: Worthy Keeler on 06/18/2019 18:16:04 [name], [name] -------------------------------------------------------------------------------- Physician Orders Details Patient Name: [name]. Date of Service: 06/18/2019 2:45 PM Medical Record Number: [id number] Patient Account Number: [id number] Date of Birth/Sex: 01/30/1928 (85 y.o. F) Treating RN: [name] Barman Primary Care Provider: Park Liter Other Clinician: Referring  Provider: Park Liter Treating Provider/Extender: [name], [name] Weeks in Treatment: 50 Verbal / Phone Orders: No Diagnosis Coding ICD-10 Coding Code Description I87.2 Venous insufficiency (chronic) (peripheral) S51.001A Unspecified open wound of right elbow, initial encounter Wound Cleansing Wound #16 Right Elbow o Clean wound with Normal Saline. Primary Wound Dressing Wound #16 Right Elbow o Other: - Nystatin Powder Secondary Dressing Wound #16 Right Elbow o ABD pad o Conform/Kerlix o Other - stetch net Dressing Change Frequency Wound #16 Right Elbow o Change dressing every day. Follow-up Appointments Wound #16 Right Elbow o Return Appointment in 1 week. Electronic Signature(s) Signed: 06/18/2019 5:55:45 PM By: Gretta Cool, BSN, RN, CWS, Kim RN, BSN Signed: 06/18/2019 6:39:08 PM By: Worthy Keeler PA-C Entered By: Gretta Cool BSN, RN, CWS, Kim on 06/18/2019 15:21:18 [name], [name] -------------------------------------------------------------------------------- Problem List Details Patient Name: [name], [name] Date of Service: 06/18/2019 2:45 PM Medical Record Number: [id number] Patient Account Number: [id number] Date of Birth/Sex: May 26, 1928 (85 y.o. F) Treating RN: [name] Barman Primary Care Provider: Park Liter Other Clinician: Referring Provider: Park Liter Treating Provider/Extender: [name], [name] Weeks in Treatment: 14 Active Problems ICD-10 Evaluated Encounter Code Description Active Date Today Diagnosis I87.2 Venous insufficiency (chronic) (peripheral) 03/10/2019 No Yes S51.001A Unspecified open wound of right elbow, initial encounter 04/28/2019 No Yes Inactive Problems Resolved Problems ICD-10 Code Description Active Date Resolved Date L97.812 Non-pressure chronic ulcer of other part of right lower leg with fat 03/10/2019 03/10/2019 layer exposed Electronic Signature(s) Signed: 06/18/2019 2:38:15 PM By: Worthy Keeler  PA-C Entered By: Worthy Keeler on 06/18/2019 14:38:15 [name], [name] ([id number]) -------------------------------------------------------------------------------- Progress Note Details Patient Name: [name], [name] Date of Service: 06/18/2019 2:45 PM Medical Record Number: [id number] Patient Account Number: [id number] Date of Birth/Sex: [birth year] (85 y.o. F) Treating RN: [name] Barman Primary Care Provider: Park Liter Other Clinician: Referring Provider: Park Liter Treating Provider/Extender: [name], [name] Weeks in Treatment: 14 Subjective Chief Complaint Information obtained from Patient Right elbow skin tear History of Present Illness (HPI) 02/12/18 ADMISSION This is an 85 year old woman who is recently moved to [place] from [place]. Her story began in February where she fell on some ice suffering extensive lacerations to her bilateral lower extremities. She is able to show me extensive pictures of the right lower leg but with going to a wound care center and [place] 3 times a week these eventually closed over. She has been left with 1 area on the upper lateral left calf. She has been applying Neosporin to this and applying a bandage. Currently this measures 2 x 1.5 cm. The patient  is not a diabetic. She is an ex-smoker quitting 40 years ago. She does have COPD by description. ABIs in our clinic were 1.32 on the right and 1.03 on the left 02/19/18; right lower leg wound which was initially trauma. The area that we look that last week is smaller. Still covered in a nonviable surface however. We are using Iodoflex 02/26/18; right lower leg wound which was initially trauma in the setting of chronic venous insufficiency. Surface of the wound looks much better healthy granulation advancing epithelialization. We have good edema control 03/05/18; right lower leg wound which was initially trauma in the setting of chronic venous insufficiency. She continues to make nice  progress here. We have good edema control He arrives today with a new traumatic laceration on her right dorsal forearm which she states happened while she was putting on a sweatshirt 03/12/18; right lower leg wound as closed however it still looks vulnerable. She has chronic venous insufficiency. The new wound from last week a traumatic area on her right dorsal or arm unfortunately does not have a viable surface. I remove some nonviable skin and necrotic subcutaneous debris from the wound surface. Hemostasis with silver nitrate and direct pressure 03/19/18; right lower leg wound is closed. She has chronic venous insufficiency and will need ongoing compression The new wounds from 2 weeks ago on the right upper elbow is closed. The area on her right dorsal forearm continues to have a nonviable surface requiring debridement 03/26/18; we'll need to look into ongoing compression for her legs. 2 wounds on the right upper elbow is closed. The area on the right dorsal forearm looks better. Hydrofera Blue secondary to hypertrophic granulation 04/02/18; he has compression stockings although she did not wear them today. Severe chronic venous insufficiency. She has 2 wounds on the right upper elbow which I said were closed last week which actually are although they're very small and she has a smaller clean wound on the right dorsal forearm. We've been using Hydrofera Blue secondary to some upper granulation. All of this looks better 04/09/18-She is seen in follow up evaluation for a right elbow and right forearm skin tear. The right elbow is healed. We will continue with same treatment plan and she'll follow next week 04/16/18-She is seen in follow-up evaluation for right forearm skin tear, this is essentially healed. We will cover with foam border and she will follow-up next week 04/23/18; the patient arrives with a new skin tear on her right dorsal arm just below the elbow. She got this while carrying a box. She  has a linear skin tear. There is no depth I don't think this should've been sutured. There is a skin flap medially I'm not sure if this will be viable or not 04/30/18; the new skin tear from last week unfortunately has remained viable in terms of the skin flap. She has a small open area that looks healthy. Using moistened silver collagen 05/07/18; right dorsal arm skin tear. Nonviable tissue over the remnants of the wound. We've been using silver collagen [name], [name] ([id number]) 05/21/18; right dorsal arm skin tear. Nonviable tissue over a small remnant of the wound was washed off. We've been using silver collagen which we will continue 06/04/18; the right dorsal arm skin tear has healed. She arrives today with a traumatic wound just above the olecranon of her left elbow. This is a skin tear with a nonviable nonadherent flap which was removed. We'll use silver collagen here. Also noteworthy she arrives without  her compression stockings 06/18/18; the area just above the olecranon of her left elbow. It is smaller and generally has a healthy surface. I was surprised to learn that the patient is actually changing this herself although. Appears this week she was putting some topical lidocaine on this that she bought over-the-counter at the drugstore. She did get supplies at home she didn't know how to put it on. She drove herself here today i.e. not eligible for home health Readmission: 11/04/18 on evaluation today patient presents for evaluation our clinic news to include the she has on her lower extremity which occurred as the result of having hit this on a piece of furniture. This was back in December. She states that her daughter has been trying to get her to come into have this evaluated here the wound care center but she was being somewhat stubborn. Nonetheless upon evaluation today the wound really appears to be potentially healed she does have some swelling of lower extremity which I think  will come into play. With that being said he tells me that even yesterday this was still making clear fluid and therefore there may be some issues with continued problems with the swelling and drainage secondary to her venous stasis. Nonetheless overall wound appears to be doing fairly well which is good news. 11/11/18 on evaluation today patient actually appears to be doing excellent in the area of question that was green just the day before he saw her last time actually appears to have not drain that always in past week. I think this is done very well and at this point though she's had some discomfort I do not see any evidence of open wound which he needs to continue see wound care. Readmission: 03/10/19 on evaluation today patient presents for reevaluation here in our clinic concerning issues that she actually is having with the new problem on her right lower leg due to trauma that occurred when a car door shut on her leg last Thursday. This is not been quite a week and she does have several skin tears unfortunately that resulted from this injury. Fortunately there's no signs of active infection at this time. No fevers, chills, nausea, or vomiting noted at this time. She does have some hot tissue noted at this point that has me concerned about the possibility of needing to move this in order to allow the areas to heal appropriately. Fortunately there's no signs of active infection at this time. 03/17/19-Patient returns to clinic at 1 week, we have been using Prisma on the larger wounds with Kerlix and Xeroform. As many as 5 wounds were number last time 3 of which have closed, leaving the right proximal tibial and right distal medial leg wound both of which are looking good 03/24/19 on evaluation today patient appears to be doing better in regard to her wounds at this time. Fortunately there is no sign of active infection at this time. No fevers chills noted. She's been tolerating the dressing changes  without complication which is good news she's been performing these herself it sounds like home health is not going to be coming out it sounds as if they feel like she may be able to take care of yourself and not be technically homebound. Nonetheless though that is unfortunate it does seem like she's done fairly well with taking care of yourself from the standpoint of the dressings. 03/31/19 on evaluation today patient actually appears to be doing well with regard to her lower extremity ulcers. She  had one area to heal today and in regard to her right lower extremity were she continues to have an open wound I did have to perform some sharp debridement today. Subsequently the patient still has an area of what appears to be possibly a small hematoma on the anterior portion of her left lower extremity. This is not open enough at this time but nonetheless does seem to show some evidence of hardening which is not necessarily a bad thing. Eventually this may pop off but again I'm not interested in really removing this prematurely. 04/07/19 on evaluation today patient actually appears to be doing well with regard to her lower extremity ulcers. She is been tolerating the dressing changes without complication. She seems to be making excellent progress which is great news. She does have one small new skin tear area but the good news is she was able to pull the skin back over and it really looks like it is healing quite nicely. Overall that is just a very small region and again seems to be healing nicely. 04/14/19 upon evaluation today patient actually appears to be doing quite well with regard to the wounds on the right lower extremity. In fact everything appears to be healed as of today. This is excellent news. She does however have the area on the left lower extremity which is showing some signs of loosening up we have been watching this area it is no longer fluctuant and in fact with the Betadine on it it is  been slowly dry now but is now lifting up on the edge. There may be a wound underneath [name], [name] this region. 04/21/2019 on evaluation today patient appears to be doing excellent in regard to her bilateral lower extremities. In fact she has been tolerating the dressing changes without complication does not seem to show any signs of infection at this point. In fact based on what I am seeing currently I think she is completely healed. 05/04/2019 on evaluation today patient actually appears to be doing quite well with regard to her right elbow. She has been tolerating the dressing changes without complication. Fortunately there is no signs of active infection at this time. Overall I am very pleased with how things are progressing. The elbow almost appears to be completely healed though it is not 100% today. 05/14/2019 on evaluation today patient actually appears to be doing well with regard to her elbow skin tear that we have been treating in fact this appears to be completely healed. Unfortunately she has a new skin tear noted as of today which actually occurred in the last week since I previously saw her. This again is unfortunate that it has been so long because I am not going to be able to actually pull the skin over where you have to trim this away and let it heal by second intent. This new wound is in the right elbow region as well but again separate from the original ulcer. 05/19/2019 on evaluation today patient appears to be doing well with regard to her skin tear on her elbow. She has been tolerating the dressing changes without complication. Fortunately there is no signs of active infection at this time. No fever chills noted. Overall I am very pleased with how things appear today. She is not having any significant pain which is also excellent news. 05/26/2019 on evaluation today patient actually appears to be doing well with regard to her right elbow ulcer. She has  been tolerating the dressing  changes without complication. Fortunately there is no signs of active infection at this time. No fevers, chills, nausea, vomiting, or diarrhea. 06/02/2019 on evaluation today patient appears to be doing very well with regard to her wound of the right elbow. In fact there is just a very small opening still remaining for the most part this is completely closed and appears to be doing excellent. There is no signs of active infection at this time which is also good news. No fevers, chills, nausea, vomiting, or diarrhea. 06/09/2019 on evaluation today patient appears to actually be doing excellent with regard to her original wound on her elbow which really appears to be completely healed at this point. With that being said she is having issues with an area generally of weeping in this region which I am unsure exactly what is causing this. Does not appear to be obviously bacterially infected although that could be part of the issue. With that being said I do believe that there is also a chance there could be a fungal infection here. 06/18/2019 on evaluation today patient's elbow ulcer appears to be doing much better I do believe this was more likely a fungal infection that was going on at the last visit and fortunately she seems to be doing much better at this point. With that being said this is not completely healed but I am hopeful will be so by next week. She is very pleased that is really not having any pain in regard to the elbow. Patient History Information obtained from Patient. Family History Cancer - Siblings,Paternal Grandparents, Diabetes - Siblings, Heart Disease - Mother,Father,Child,Paternal Grandparents, Hypertension - Child, No family history of Hereditary Spherocytosis, Kidney Disease, Lung Disease, Seizures, Stroke, Thyroid Problems, Tuberculosis. Social History Former smoker - quit 40 yrs ago, Marital Status - Divorced, Alcohol Use - Never, Drug Use - No  History, Caffeine Use - Rarely. Medical History Eyes Patient has history of Cataracts - surgery 2000, Glaucoma Respiratory Patient has history of Asthma, Chronic Obstructive Pulmonary Disease (COPD) Buller, Elza H. ([id number]) Denies history of Pneumothorax, Sleep Apnea, Tuberculosis Cardiovascular Patient has history of Arrhythmia - a-fib, Hypertension Denies history of Angina, Congestive Heart Failure, Coronary Artery Disease, Deep Vein Thrombosis, Hypotension, Myocardial Infarction, Peripheral Arterial Disease, Peripheral Venous Disease, Phlebitis, Vasculitis Endocrine Denies history of Type I Diabetes, Type II Diabetes Genitourinary Denies history of End Stage Renal Disease Immunological Denies history of Lupus Erythematosus, Raynaud s, Scleroderma Integumentary (Skin) Denies history of History of Burn, History of pressure wounds Musculoskeletal Denies history of Gout, Rheumatoid Arthritis, Osteoarthritis, Osteomyelitis Neurologic Denies history of Dementia, Neuropathy, Quadriplegia, Paraplegia, Seizure Disorder Oncologic Denies history of Received Chemotherapy, Received Radiation Psychiatric Denies history of Anorexia/bulimia, Confinement Anxiety Hospitalization/Surgery History - Colon cancer. Review of Systems (ROS) Constitutional Symptoms (General Health) Denies complaints or symptoms of Fatigue, Fever, Chills, Marked Weight Change. Respiratory Denies complaints or symptoms of Chronic or frequent coughs, Shortness of Breath. Cardiovascular Denies complaints or symptoms of Chest pain, LE edema. Psychiatric Denies complaints or symptoms of Anxiety, Claustrophobia. Objective Constitutional Well-nourished and well-hydrated in no acute distress. Vitals Time Taken: 12:45 PM, Height: 60 in, Weight: 92 lbs, BMI: 18, Temperature: 98.2 F, Pulse: 56 bpm, Respiratory Rate: 18 breaths/min, Blood Pressure: 147/48 mmHg. Respiratory normal breathing without difficulty.  clear to auscultation bilaterally. Cardiovascular regular rate and rhythm with normal S1, S2. Psychiatric this patient is able to make decisions and demonstrates good insight into disease process. Alert and Oriented x 3. pleasant Arts, Milicent H. ([id number]) and cooperative.  General Notes: Just has a very small open area at this time based on what I am seeing does not appear to be any evidence of active infection which is good news and overall I am pleased with the progress though not completely healed she is much closer than during the last evaluation. Integumentary (Hair, Skin) Wound #16 status is Open. Original cause of wound was Trauma. The wound is located on the Right Elbow. The wound measures 1cm length x 1cm width x 0.1cm depth; 0.785cm^2 area and 0.079cm^3 volume. There is Fat Layer (Subcutaneous Tissue) Exposed exposed. There is no tunneling or undermining noted. There is a medium amount of serous drainage noted. The wound margin is flat and intact. There is large (67-100%) pink granulation within the wound bed. There is no necrotic tissue within the wound bed. Assessment Active Problems ICD-10 Venous insufficiency (chronic) (peripheral) Unspecified open wound of right elbow, initial encounter Plan Wound Cleansing: Wound #16 Right Elbow: Clean wound with Normal Saline. Primary Wound Dressing: Wound #16 Right Elbow: Other: - Nystatin Powder Secondary Dressing: Wound #16 Right Elbow: ABD pad Conform/Kerlix Other - stetch net Dressing Change Frequency: Wound #16 Right Elbow: Change dressing every day. Follow-up Appointments: Wound #16 Right Elbow: Return Appointment in 1 week. 1. My suggestion currently is going to be that we go ahead and continue with the nystatin powder and the dry dressing over top of this over the next week patient is in agreement with the plan. 2. I do not want her to have any adhesives on this area as that has caused problems with skin  breakdown in the past with her. 3. I do want her to protect the area against the dry dressing will be a good option for her for the time being. [name], [name] ([id number]) We will see patient back for reevaluation in 1 week here in the clinic. If anything worsens or changes patient will contact our office for additional recommendations. I am hopeful she will be healed by next week. Electronic Signature(s) Signed: 06/18/2019 6:16:29 PM By: Worthy Keeler PA-C Entered By: Worthy Keeler on 06/18/2019 18:16:28 [name], [name] ([id number]) -------------------------------------------------------------------------------- ROS/PFSH Details Patient Name: [name]. Date of Service: 06/18/2019 2:45 PM Medical Record Number: [id number] Patient Account Number: [id number] Date of Birth/Sex: 1928/02/24 (85 y.o. F) Treating RN: [name] Barman Primary Care Provider: Park Liter Other Clinician: Referring Provider: Park Liter Treating Provider/Extender: [name], [name] Weeks in Treatment: 14 Information Obtained From Patient Constitutional Symptoms (General Health) Complaints and Symptoms: Negative for: Fatigue; Fever; Chills; Marked Weight Change Respiratory Complaints and Symptoms: Negative for: Chronic or frequent coughs; Shortness of Breath Medical History: Positive for: Asthma; Chronic Obstructive Pulmonary Disease (COPD) Negative for: Pneumothorax; Sleep Apnea; Tuberculosis Cardiovascular Complaints and Symptoms: Negative for: Chest pain; LE edema Medical History: Positive for: Arrhythmia - a-fib; Hypertension Negative for: Angina; Congestive Heart Failure; Coronary Artery Disease; Deep Vein Thrombosis; Hypotension; Myocardial Infarction; Peripheral Arterial Disease; Peripheral Venous Disease; Phlebitis; Vasculitis Psychiatric Complaints and Symptoms: Negative for: Anxiety; Claustrophobia Medical History: Negative for: Anorexia/bulimia; Confinement  Anxiety Eyes Medical History: Positive for: Cataracts - surgery 2000; Glaucoma Endocrine Medical History: Negative for: Type I Diabetes; Type II Diabetes Genitourinary Medical History: Negative for: End Stage Renal Disease Bauserman, Barby H. ([id number]) Immunological Medical History: Negative for: Lupus Erythematosus; Raynaudos; Scleroderma Integumentary (Skin) Medical History: Negative for: History of Burn; History of pressure wounds Musculoskeletal Medical History: Negative for: Gout; Rheumatoid Arthritis; Osteoarthritis; Osteomyelitis Neurologic Medical History: Negative for: Dementia; Neuropathy; Quadriplegia; Paraplegia;  Seizure Disorder Oncologic Medical History: Negative for: Received Chemotherapy; Received Radiation HBO Extended History Items Eyes: Eyes: Cataracts Glaucoma Immunizations Pneumococcal Vaccine: Received Pneumococcal Vaccination: Yes Implantable Devices None Hospitalization / Surgery History Type of Hospitalization/Surgery Colon cancer Family and Social History Cancer: Yes - Siblings,Paternal Grandparents; Diabetes: Yes - Siblings; Heart Disease: Yes - Mother,Father,Child,Paternal Grandparents; Hereditary Spherocytosis: No; Hypertension: Yes - Child; Kidney Disease: No; Lung Disease: No; Seizures: No; Stroke: No; Thyroid Problems: No; Tuberculosis: No; Former smoker - quit 40 yrs ago; Marital Status - Divorced; Alcohol Use: Never; Drug Use: No History; Caffeine Use: Rarely; Financial Concerns: No; Food, Clothing or Shelter Needs: No; Support System Lacking: No; Transportation Concerns: No Physician Affirmation I have reviewed and agree with the above information. Electronic Signature(s) Signed: 06/18/2019 6:39:08 PM By: Worthy Keeler PA-C Signed: 06/19/2019 12:43:56 PM By: Gretta Cool, BSN, RN, CWS, Kim RN, BSN Entered By: Worthy Keeler on 06/18/2019 18:15:49 BRIAH, MOSELY  ([id number]) -------------------------------------------------------------------------------- SuperBill Details Patient Name: [name]. Date of Service: 06/18/2019 Medical Record Number: [id number] Patient Account Number: [id number] Date of Birth/Sex: [birth year] (85 y.o. F) Treating RN: [name] Barman Primary Care Provider: Park Liter Other Clinician: Referring Provider: Park Liter Treating Provider/Extender: [name], [name] Weeks in Treatment: 14 Diagnosis Coding ICD-10 Codes Code Description I87.2 Venous insufficiency (chronic) (peripheral) S51.001A Unspecified open wound of right elbow, initial encounter Facility Procedures CPT4 Code: [id number] Description: [phone] - WOUND CARE VISIT-LEV 2 EST PT Modifier: Quantity: 1 Physician Procedures CPT4 Code: [id number] Description: [id number] - WC PHYS LEVEL 4 - EST PT ICD-10 Diagnosis Description I87.2 Venous insufficiency (chronic) (peripheral) S51.001A Unspecified open wound of right elbow, initial encounter Modifier: Quantity: 1 Electronic Signature(s) Signed: 06/18/2019 6:16:53 PM By: Worthy Keeler PA-C Previous Signature: 06/18/2019 5:55:45 PM Version By: Gretta Cool, BSN, RN, CWS, Kim RN, BSN Entered By: Worthy Keeler on 06/18/2019 18:16:53

## 2019-06-19 ENCOUNTER — Other Ambulatory Visit
Admission: RE | Admit: 2019-06-19 | Discharge: 2019-06-19 | Disposition: A | Discharge status: Home / Self Care | Payer: Medicare Other | Source: Ambulatory Visit | Attending: Internal Medicine | Admitting: Internal Medicine

## 2019-06-19 DIAGNOSIS — Z01812 Encounter for preprocedural laboratory examination: Secondary | ICD-10-CM | POA: Insufficient documentation

## 2019-06-19 DIAGNOSIS — Z20828 Contact with and (suspected) exposure to other viral communicable diseases: Secondary | ICD-10-CM | POA: Insufficient documentation

## 2019-06-19 LAB — SARS CORONAVIRUS 2 (TAT 6-24 HRS): SARS Coronavirus 2: NEGATIVE

## 2019-06-23 ENCOUNTER — Ambulatory Visit: Payer: Medicare Other | Admitting: Physician Assistant

## 2019-06-24 ENCOUNTER — Ambulatory Visit
Admission: RE | Admit: 2019-06-24 | Discharge: 2019-06-24 | Disposition: A | Discharge status: Home / Self Care | Payer: Medicare Other | Attending: Internal Medicine | Admitting: Internal Medicine

## 2019-06-24 ENCOUNTER — Encounter
Admission: RE | Disposition: A | Discharge status: Home / Self Care | Payer: Self-pay | Source: Home / Self Care | Attending: Internal Medicine

## 2019-06-24 ENCOUNTER — Ambulatory Visit: Payer: Medicare Other | Admitting: Certified Registered"

## 2019-06-24 ENCOUNTER — Other Ambulatory Visit: Payer: Self-pay

## 2019-06-24 ENCOUNTER — Telehealth: Payer: Self-pay

## 2019-06-24 ENCOUNTER — Encounter: Payer: Self-pay | Admitting: Anesthesiology

## 2019-06-24 DIAGNOSIS — Z7951 Long term (current) use of inhaled steroids: Secondary | ICD-10-CM | POA: Insufficient documentation

## 2019-06-24 DIAGNOSIS — Z9049 Acquired absence of other specified parts of digestive tract: Secondary | ICD-10-CM | POA: Insufficient documentation

## 2019-06-24 DIAGNOSIS — F329 Major depressive disorder, single episode, unspecified: Secondary | ICD-10-CM | POA: Insufficient documentation

## 2019-06-24 DIAGNOSIS — I10 Essential (primary) hypertension: Secondary | ICD-10-CM | POA: Diagnosis not present

## 2019-06-24 DIAGNOSIS — Z7982 Long term (current) use of aspirin: Secondary | ICD-10-CM | POA: Diagnosis not present

## 2019-06-24 DIAGNOSIS — K648 Other hemorrhoids: Secondary | ICD-10-CM | POA: Diagnosis not present

## 2019-06-24 DIAGNOSIS — Z980 Intestinal bypass and anastomosis status: Secondary | ICD-10-CM | POA: Diagnosis not present

## 2019-06-24 DIAGNOSIS — I739 Peripheral vascular disease, unspecified: Secondary | ICD-10-CM | POA: Insufficient documentation

## 2019-06-24 DIAGNOSIS — J449 Chronic obstructive pulmonary disease, unspecified: Secondary | ICD-10-CM | POA: Insufficient documentation

## 2019-06-24 DIAGNOSIS — E039 Hypothyroidism, unspecified: Secondary | ICD-10-CM | POA: Insufficient documentation

## 2019-06-24 DIAGNOSIS — D509 Iron deficiency anemia, unspecified: Secondary | ICD-10-CM | POA: Insufficient documentation

## 2019-06-24 DIAGNOSIS — Z79899 Other long term (current) drug therapy: Secondary | ICD-10-CM | POA: Diagnosis not present

## 2019-06-24 DIAGNOSIS — Z87891 Personal history of nicotine dependence: Secondary | ICD-10-CM | POA: Insufficient documentation

## 2019-06-24 DIAGNOSIS — Z9981 Dependence on supplemental oxygen: Secondary | ICD-10-CM | POA: Diagnosis not present

## 2019-06-24 DIAGNOSIS — K5790 Diverticulosis of intestine, part unspecified, without perforation or abscess without bleeding: Secondary | ICD-10-CM | POA: Diagnosis not present

## 2019-06-24 DIAGNOSIS — Z85038 Personal history of other malignant neoplasm of large intestine: Secondary | ICD-10-CM | POA: Diagnosis not present

## 2019-06-24 DIAGNOSIS — K5730 Diverticulosis of large intestine without perforation or abscess without bleeding: Secondary | ICD-10-CM | POA: Insufficient documentation

## 2019-06-24 DIAGNOSIS — K640 First degree hemorrhoids: Secondary | ICD-10-CM | POA: Diagnosis not present

## 2019-06-24 DIAGNOSIS — Z79890 Hormone replacement therapy (postmenopausal): Secondary | ICD-10-CM | POA: Diagnosis not present

## 2019-06-24 DIAGNOSIS — Z08 Encounter for follow-up examination after completed treatment for malignant neoplasm: Secondary | ICD-10-CM | POA: Diagnosis not present

## 2019-06-24 DIAGNOSIS — Z1211 Encounter for screening for malignant neoplasm of colon: Secondary | ICD-10-CM | POA: Diagnosis not present

## 2019-06-24 HISTORY — DX: Malignant (primary) neoplasm, unspecified: C80.1

## 2019-06-24 HISTORY — DX: Hypothyroidism, unspecified: E03.9

## 2019-06-24 HISTORY — DX: Iron deficiency anemia, unspecified: D50.9

## 2019-06-24 HISTORY — PX: COLONOSCOPY WITH PROPOFOL: SHX5780

## 2019-06-24 SURGERY — COLONOSCOPY WITH PROPOFOL
Anesthesia: General

## 2019-06-24 MED ORDER — PROPOFOL 500 MG/50ML IV EMUL
INTRAVENOUS | Status: DC | PRN
  Administered 2019-06-24: 75 ug/kg/min via INTRAVENOUS

## 2019-06-24 MED ORDER — SODIUM CHLORIDE 0.9 % IV SOLN
INTRAVENOUS | Status: DC | PRN
  Administered 2019-06-24: 13:00:00 via INTRAVENOUS

## 2019-06-24 MED ORDER — PROPOFOL 10 MG/ML IV BOLUS
INTRAVENOUS | Status: DC | PRN
  Administered 2019-06-24 (×2): 20 mg via INTRAVENOUS

## 2019-06-24 MED ORDER — LIDOCAINE HCL (PF) 2 % IJ SOLN
INTRAMUSCULAR | Status: DC | PRN
  Administered 2019-06-24: 40 mg via INTRADERMAL

## 2019-06-24 MED ORDER — SODIUM CHLORIDE 0.9 % IV SOLN
INTRAVENOUS | Status: DC
  Administered 2019-06-24: 13:00:00 via INTRAVENOUS

## 2019-06-24 NOTE — Anesthesia Preprocedure Evaluation (Signed)
Anesthesia Evaluation  Patient identified by MRN, date of birth, ID band Patient awake    Reviewed: Allergy & Precautions, H&P , NPO status , Patient's Chart, lab work & pertinent test results  History of Anesthesia Complications Negative for: history of anesthetic complications  Airway Mallampati: II   Neck ROM: full    Dental  (+) Edentulous Upper, Edentulous Lower, Upper Dentures, Lower Dentures   Pulmonary shortness of breath and with exertion, COPD (wears O2 when sleeping or when subjectively SOB),  oxygen dependent, neg recent URI, former smoker,    breath sounds clear to auscultation + decreased breath sounds      Cardiovascular Exercise Tolerance: Good hypertension, Pt. on medications (-) angina+ Peripheral Vascular Disease  (-) Past MI + dysrhythmias Atrial Fibrillation and Supra Ventricular Tachycardia (-) Valvular Problems/Murmurs Rate:Normal  Pt has good exercise tolerance; goes dancing  Echo 04/24/18: Left ventricle: The cavity size was normal. Systolic function was   normal. The estimated ejection fraction was in the range of 60%   to 65%. Wall motion was normal; there were no regional wall   motion abnormalities. Doppler parameters are consistent with   abnormal left ventricular relaxation (grade 1 diastolic   dysfunction). - Aortic valve: There was mild regurgitation. - Left atrium: The atrium was normal in size. - Right ventricle: Systolic function was normal. - Tricuspid valve: There was mild-moderate regurgitation. - Pulmonary arteries: Systolic pressure was mildly elevated. PA   peak pressure: 43 mm Hg (S).   Neuro/Psych PSYCHIATRIC DISORDERS Anxiety Depression negative neurological ROS  negative psych ROS   GI/Hepatic Neg liver ROS, Malignant neoplasm of colon   Endo/Other  neg diabetesHypothyroidism   Renal/GU      Musculoskeletal  (+) Arthritis ,   Abdominal   Peds  Hematology negative  hematology ROS (+)   Anesthesia Other Findings Past Medical History: No date: Anxiety No date: Atrial fibrillation (HCC) No date: Clotting disorder ([place])     Comment:  left ankle No date: COPD (chronic obstructive pulmonary disease) (HCC) No date: Depression No date: Overactive bladder No date: Thyroid disease  Past Surgical History: No date: ABDOMINAL HYSTERECTOMY No date: BREAST LUMPECTOMY 06/23/2018: COLONOSCOPY; Left     Comment:  Procedure: COLONOSCOPY;  Surgeon: Jonathon Bellows, MD;                Location: [facility] ENDOSCOPY;  Service: Gastroenterology;                Laterality: Left;  Colonoscopy first. Dr. Vicente Males will               decide if EGD is needed after colonoscopy 06/23/2018: ESOPHAGOGASTRODUODENOSCOPY; Left     Comment:  Procedure: ESOPHAGOGASTRODUODENOSCOPY (EGD);  Surgeon:               Jonathon Bellows, MD;  Location: [facility] ENDOSCOPY;  Service:               Gastroenterology;  Laterality: Left;  Colonoscopy first.               Dr. Vicente Males will decide if EGD is needed after colonoscopy  BMI    Body Mass Index:  19.22 kg/m      Reproductive/Obstetrics negative OB ROS                             Anesthesia Physical  Anesthesia Plan  ASA: III  Anesthesia Plan: General   Post-op Pain Management:  Induction: Intravenous  PONV Risk Score and Plan: Propofol infusion and TIVA  Airway Management Planned: Nasal Cannula and Natural Airway  Additional Equipment:   Intra-op Plan:   Post-operative Plan:   Informed Consent: I have reviewed the patients History and Physical, chart, labs and discussed the procedure including the risks, benefits and alternatives for the proposed anesthesia with the patient or authorized representative who has indicated his/her understanding and acceptance.     Dental Advisory Given  Plan Discussed with: Anesthesiologist, CRNA and Surgeon  Anesthesia Plan Comments:         Anesthesia Quick  Evaluation

## 2019-06-24 NOTE — Op Note (Signed)
[facility]astroenterology Patient Name: [name] Procedure Date: 06/24/2019 12:36 PM MRN: [id number] Account #: [id number] Date of Birth: 02-26-1928 Admit Type: Outpatient Age: 85 Room: [facility] ENDO ROOM 3 Gender: Female Note Status: Finalized Procedure:            Colonoscopy Indications:          High risk colon cancer surveillance: Personal history                        of colon cancer Providers:            Benay Pike. [place] MD, MD Medicines:            Propofol per Anesthesia Complications:        No immediate complications. Procedure:            Pre-Anesthesia Assessment:                       - The risks and benefits of the procedure and the                        sedation options and risks were discussed with the                        patient. All questions were answered and informed                        consent was obtained.                       - Patient identification and proposed procedure were                        verified prior to the procedure by the nurse. The                        procedure was verified in the procedure room.                       - ASA Grade Assessment: III - A patient with severe                        systemic disease.                       - After reviewing the risks and benefits, the patient                        was deemed in satisfactory condition to undergo the                        procedure.                       After obtaining informed consent, the colonoscope was                        passed under direct vision. Throughout the procedure,                        the patient's blood pressure, pulse, and oxygen  saturations were monitored continuously. The                        Colonoscope was introduced through the anus and                        advanced to the the ileocolonic anastomosis. The                        colonoscopy was performed without difficulty. The               patient tolerated the procedure well. The quality of                        the bowel preparation was good. The rectum and                        ileocolonic anastomosis were photographed. The entire                        colon was examined. Findings:      The perianal and digital rectal examinations were normal. Pertinent       negatives include normal sphincter tone and no palpable rectal lesions.      Many small-mouthed diverticula were found in the sigmoid colon.      There was evidence of a prior end-to-side ileo-colonic anastomosis in       the ascending colon. This was patent and was characterized by healthy       appearing mucosa. The anastomosis was traversed. Estimated blood loss:       none.      The terminal ileum appeared normal.      There is no endoscopic evidence of inflammation, mass, polyps or       stenosis in the entire colon.      Non-bleeding internal hemorrhoids were found during retroflexion. The       hemorrhoids were Grade I (internal hemorrhoids that do not prolapse).      The exam was otherwise without abnormality. Impression:           - Diverticulosis in the sigmoid colon.                       - Patent end-to-side ileo-colonic anastomosis,                        characterized by healthy appearing mucosa.                       - The examined portion of the ileum was normal.                       - Non-bleeding internal hemorrhoids.                       - The examination was otherwise normal.                       - No specimens collected. Recommendation:       - Patient has a contact number available for                        emergencies. The  signs and symptoms of potential                        delayed complications were discussed with the patient.                        Return to normal activities tomorrow. Written discharge                        instructions were provided to the patient.                       - Resume previous diet.                        - Continue present medications.                       - No repeat colonoscopy due to current age (26 years or                        older).                       - Return to GI office PRN.                       - The findings and recommendations were discussed with                        the patient. Procedure Code(s):    --- Professional ---                       [id number], Colorectal cancer screening; colonoscopy on                        individual at high risk Diagnosis Code(s):    --- Professional ---                       K57.30, Diverticulosis of large intestine without                        perforation or abscess without bleeding                       Z98.0, Intestinal bypass and anastomosis status                       K64.0, First degree hemorrhoids                       Z85.038, Personal history of other malignant neoplasm                        of large intestine CPT copyright 2019 [employer]. All rights reserved. The codes documented in this report are preliminary and upon coder review may  be revised to meet current compliance requirements. Efrain Sella MD, MD 06/24/2019 1:19:41 PM This report has been signed electronically. Number of Addenda: 0 Note Initiated On: 06/24/2019 12:36 PM Scope Withdrawal Time: 0 hours 5 minutes 23 seconds  Total Procedure Duration: 0 hours 11 minutes 16 seconds  Estimated Blood Loss:  Estimated blood loss: none.      [facility]

## 2019-06-24 NOTE — Interval H&P Note (Signed)
History and Physical Interval Note:  06/24/2019 12:43 PM  [name]  has presented today for surgery, with the diagnosis of IDA,HX.OF COLON CANCER.  The various methods of treatment have been discussed with the patient and family. After consideration of risks, benefits and other options for treatment, the patient has consented to  Procedure(s): COLONOSCOPY WITH PROPOFOL (N/A) as a surgical intervention.  The patient's history has been reviewed, patient examined, no change in status, stable for surgery.  I have reviewed the patient's chart and labs.  Questions were answered to the patient's satisfaction.     [place], [place]

## 2019-06-24 NOTE — Transfer of Care (Signed)
Immediate Anesthesia Transfer of Care Note  Patient: [name]  Procedure(s) Performed: COLONOSCOPY WITH PROPOFOL (N/A )  Patient Location: PACU  Anesthesia Type:MAC  Level of Consciousness: drowsy  Airway & Oxygen Therapy: Patient Spontanous Breathing and Patient connected to nasal cannula oxygen  Post-op Assessment: Report given to RN and Post -op Vital signs reviewed and stable  Post vital signs: Reviewed and stable  Last Vitals:  Vitals Value Taken Time  BP    Temp    Pulse    Resp    SpO2      Last Pain:  Vitals:   06/24/19 1245  TempSrc:   PainSc: 0-No pain         Complications: No apparent anesthesia complications

## 2019-06-24 NOTE — H&P (Signed)
Outpatient short stay form Pre-procedure 06/24/2019 12:41 PM  [name], M.D.  Primary Physician: Park Liter, D.O.  Reason for visit:  Personal hx of colon cancer - 06/2018 s/p partial colectomy.  History of present illness:  Pt presents for personal hx of colon cancer s/p segmental colectomy by Dr. Hampton Abbot in 06/2018. No formal colonoscopy since that time. Patient denies change in bowel habits, rectal bleeding, weight loss or abdominal pain.    No current facility-administered medications for this encounter.   Medications Prior to Admission  Medication Sig Dispense Refill Last Dose  . amLODipine (NORVASC) 2.5 MG tablet Take 1 tablet (2.5 mg total) by mouth daily. 90 tablet 1 06/24/2019 at Unknown time  . aspirin EC 81 MG tablet Take 1 tablet (81 mg total) by mouth daily. 100 tablet 4 Past Week at Unknown time  . ferrous sulfate 325 (65 FE) MG tablet Take 1 tablet (325 mg total) by mouth 3 (three) times daily with meals. 90 tablet 3 Past Week at Unknown time  . levothyroxine (SYNTHROID, LEVOTHROID) 88 MCG tablet TAKE 1 TABLET BY MOUTH ONCE DAILY BEFORE BREAKFAST 90 tablet 3 06/24/2019 at Unknown time  . metoprolol tartrate (LOPRESSOR) 50 MG tablet Take 1 tablet (50 mg total) by mouth 2 (two) times daily. 180 tablet 1 06/24/2019 at Unknown time  . multivitamin-lutein (OCUVITE-LUTEIN) CAPS capsule Take 1 capsule by mouth daily.  0 Past Week at Unknown time  . nortriptyline (PAMELOR) 10 MG capsule Take 10 mg by mouth at bedtime.   06/23/2019 at Unknown time  . solifenacin (VESICARE) 10 MG tablet Take 1 tablet by mouth once daily 90 tablet 0 06/24/2019 at Unknown time  . acetaminophen (TYLENOL) 325 MG tablet Take 2 tablets (650 mg total) by mouth every 6 (six) hours as needed for mild pain or fever.     . feeding supplement, ENSURE ENLIVE, (ENSURE ENLIVE) LIQD Take 237 mLs by mouth 2 (two) times daily between meals. 237 mL 12   . fluticasone (FLONASE) 50 MCG/ACT nasal spray Place 2  sprays into both nostrils daily. 16 g 6   . Fluticasone-Salmeterol (ADVAIR DISKUS) 250-50 MCG/DOSE AEPB Inhale 1 puff into the lungs daily. Rinse mouth after use. 1 each 6   . latanoprost (XALATAN) 0.005 % ophthalmic solution INSTILL 1 DROP INTO EACH EYE AT BEDTIME     . OXYGEN Inhale 2 L into the lungs. As needed during the day, and full use at bedtime        No Known Allergies   Past Medical History:  Diagnosis Date  . Anxiety   . Atrial fibrillation ([place])   . Cancer ([place])    COLON CANCER STAGE 2  . Clotting disorder ([facility])    left ankle  . COPD (chronic obstructive pulmonary disease) ([place])   . Depression   . Hypertension   . Hypothyroidism   . IDA (iron deficiency anemia)   . Melena 06/22/2018  . Obstructive chronic bronchitis without exacerbation ([place]) 12/18/2016  . Overactive bladder   . Thyroid disease     Review of systems:  Otherwise negative.    Physical Exam  Gen: Alert, oriented. Appears stated age.  HEENT: [place]/AT. PERRLA. Lungs: CTA, no wheezes. CV: RR nl S1, S2. Abd: soft, benign, no masses. BS+ Ext: No edema. Pulses 2+    Planned procedures: Proceed with colonoscopy. The patient understands the nature of the planned procedure, indications, risks, alternatives and potential complications including but not limited to bleeding, infection, perforation, damage to internal organs  and possible oversedation/side effects from anesthesia. The patient agrees and gives consent to proceed.  Please refer to procedure notes for findings, recommendations and patient disposition/instructions.      [name], M.D. Gastroenterology 06/24/2019  12:41 PM

## 2019-06-24 NOTE — Anesthesia Post-op Follow-up Note (Signed)
Anesthesia QCDR form completed.        

## 2019-06-24 NOTE — Anesthesia Postprocedure Evaluation (Signed)
Anesthesia Post Note  Patient: [name]  Procedure(s) Performed: COLONOSCOPY WITH PROPOFOL (N/A )  Patient location during evaluation: Endoscopy Anesthesia Type: General Level of consciousness: awake and alert Pain management: pain level controlled Vital Signs Assessment: post-procedure vital signs reviewed and stable Respiratory status: spontaneous breathing, nonlabored ventilation, respiratory function stable and patient connected to nasal cannula oxygen Cardiovascular status: blood pressure returned to baseline and stable Postop Assessment: no apparent nausea or vomiting Anesthetic complications: no     Last Vitals:  Vitals:   06/24/19 1325 06/24/19 1335  BP: 116/63 128/69  Pulse: (!) 58 (!) 58  Resp: 15 (!) 22  Temp:    SpO2: 97% 97%    Last Pain:  Vitals:   06/24/19 1335  TempSrc:   PainSc: 0-No pain                 Martha Clan

## 2019-06-25 ENCOUNTER — Encounter: Payer: Medicare Other | Admitting: Physician Assistant

## 2019-06-25 ENCOUNTER — Other Ambulatory Visit: Payer: Self-pay

## 2019-06-25 DIAGNOSIS — S51011A Laceration without foreign body of right elbow, initial encounter: Secondary | ICD-10-CM | POA: Diagnosis not present

## 2019-06-25 DIAGNOSIS — J449 Chronic obstructive pulmonary disease, unspecified: Secondary | ICD-10-CM | POA: Diagnosis not present

## 2019-06-25 DIAGNOSIS — I10 Essential (primary) hypertension: Secondary | ICD-10-CM | POA: Diagnosis not present

## 2019-06-25 DIAGNOSIS — L97812 Non-pressure chronic ulcer of other part of right lower leg with fat layer exposed: Secondary | ICD-10-CM | POA: Diagnosis not present

## 2019-06-25 DIAGNOSIS — Z87891 Personal history of nicotine dependence: Secondary | ICD-10-CM | POA: Diagnosis not present

## 2019-06-25 DIAGNOSIS — I4891 Unspecified atrial fibrillation: Secondary | ICD-10-CM | POA: Diagnosis not present

## 2019-06-25 DIAGNOSIS — S8991XA Unspecified injury of right lower leg, initial encounter: Secondary | ICD-10-CM | POA: Diagnosis not present

## 2019-06-25 DIAGNOSIS — S81802A Unspecified open wound, left lower leg, initial encounter: Secondary | ICD-10-CM | POA: Diagnosis not present

## 2019-06-25 DIAGNOSIS — I872 Venous insufficiency (chronic) (peripheral): Secondary | ICD-10-CM | POA: Diagnosis not present

## 2019-06-25 DIAGNOSIS — S51811A Laceration without foreign body of right forearm, initial encounter: Secondary | ICD-10-CM | POA: Diagnosis not present

## 2019-06-25 NOTE — Progress Notes (Signed)
HARMONIEE, PALMISANO ([id number]) Visit Report for 06/25/2019 Arrival Information Details Patient Name: [name], [name]. Date of Service: 06/25/2019 12:30 PM Medical Record Number: [id number] Patient Account Number: [id number] Date of Birth/Sex: May 19, 1928 (85 y.o. F) Treating RN: [name] Primary Care Tiffny Gemmer: Park Liter Other Clinician: Referring Florencia Zaccaro: Park Liter Treating Rocky Rishel/Extender: [name], [name] Weeks in Treatment: 15 Visit Information History Since Last Visit Added or deleted any medications: No Patient Arrived: Ambulatory Any new allergies or adverse reactions: No Arrival Time: 12:32 Had a fall or experienced change in Yes Accompanied By: self activities of daily living that may affect Transfer Assistance: None risk of falls: Patient Identification Verified: Yes Signs or symptoms of abuse/neglect since last visito No Secondary Verification Process Yes Hospitalized since last visit: No Completed: Has Dressing in Place as Prescribed: Yes Patient Requires Transmission-Based No Pain Present Now: No Precautions: Patient Has Alerts: Yes Patient Alerts: Patient on Blood Thinner 81mg  aspirin Electronic Signature(s) Signed: 06/25/2019 4:02:16 PM By: Lorine Bears RCP, RRT, CHT Entered By: Lorine Bears on 06/25/2019 12:36:11 Swantek, Alece Lemmie Evens ([id number]) -------------------------------------------------------------------------------- Encounter Discharge Information Details Patient Name: Wise, [name] Date of Service: 06/25/2019 12:30 PM Medical Record Number: [id number] Patient Account Number: [id number] Date of Birth/Sex: 11-05-1927 (85 y.o. F) Treating RN: [name] Primary Care Jaley Yan: Park Liter Other Clinician: Referring Stephene Alegria: Park Liter Treating Hawa Henly/Extender: [name], [name] Weeks in Treatment: 15 Encounter Discharge Information Items Post Procedure Vitals Discharge  Condition: Stable Temperature (F): 98.6 Ambulatory Status: Ambulatory Pulse (bpm): 69 Discharge Destination: Home Respiratory Rate (breaths/min): 16 Transportation: Private Auto Blood Pressure (mmHg): 154/59 Accompanied By: self Schedule Follow-up Appointment: Yes Clinical Summary of Care: Electronic Signature(s) Signed: 06/25/2019 1:28:54 PM By: [name] Entered By: [name] on 06/25/2019 13:06:03 [name], [name] ([id number]) -------------------------------------------------------------------------------- Lower Extremity Assessment Details Patient Name: Wise, [name] Date of Service: 06/25/2019 12:30 PM Medical Record Number: [id number] Patient Account Number: [id number] Date of Birth/Sex: [birth year] (85 y.o. F) Treating RN: Harold Barban Primary Care Tyr Franca: Park Liter Other Clinician: Referring Sundra Haddix: Park Liter Treating Rawlin Reaume/Extender: [name], [name] Weeks in Treatment: 15 Notes Wound on arm Electronic Signature(s) Signed: 06/25/2019 4:05:35 PM By: Harold Barban Entered By: Harold Barban on 06/25/2019 12:39:58 [name], [name] ([id number]) -------------------------------------------------------------------------------- Multi Wound Chart Details Patient Name: Wise, [name] Date of Service: 06/25/2019 12:30 PM Medical Record Number: [id number] Patient Account Number: [id number] Date of Birth/Sex: 18-Jan-1928 (85 y.o. F) Treating RN: [name] Primary Care Amberli Ruegg: Park Liter Other Clinician: Referring Desean Heemstra: Park Liter Treating Alois Mincer/Extender: [name], [name] Weeks in Treatment: 15 Vital Signs Height(in): 60 Pulse(bpm): 86 Weight(lbs): 44 Blood Pressure(mmHg): 154/59 Body Mass Index(BMI): 18 Temperature(F): 98.6 Respiratory Rate 16 (breaths/min): Photos: [N/A:N/A] Wound Location: Right Elbow Right Forearm - Anterior N/A Wounding Event: Trauma Trauma N/A Primary Etiology: Skin Tear Trauma,  Other N/A Comorbid History: Cataracts, Glaucoma, Asthma, Cataracts, Glaucoma, Asthma, N/A Chronic Obstructive Chronic Obstructive Pulmonary Disease (COPD), Pulmonary Disease (COPD), Arrhythmia, Hypertension Arrhythmia, Hypertension Date Acquired: 05/11/2019 06/23/2019 N/A Weeks of Treatment: 6 0 N/A Wound Status: Open Open N/A Measurements L x W x D 0.2x0.2x0.1 2x3x0.1 N/A (cm) Area (cm) : 0.031 4.712 N/A Volume (cm) : 0.003 0.471 N/A % Reduction in Area: 98.10% N/A N/A % Reduction in Volume: 98.20% N/A N/A Classification: Full Thickness Without Full Thickness Without N/A Exposed Support Structures Exposed Support Structures Exudate Amount: Medium Medium N/A Exudate Type: Serous Serosanguineous N/A Exudate Color: amber red, brown N/A Wound Margin: Flat and Intact Flat and Intact N/A Granulation  Amount: Large (67-100%) Small (1-33%) N/A Granulation Quality: Pink Pink N/A Necrotic Amount: None Present (0%) Medium (34-66%) N/A Necrotic Tissue: N/A Eschar N/A Exposed Structures: Fat Layer (Subcutaneous Fat Layer (Subcutaneous N/A Tissue) Exposed: Yes Tissue) Exposed: Yes Fascia: No Fascia: No [name], [name] ([id number]) Tendon: No Tendon: No Muscle: No Muscle: No Joint: No Joint: No Bone: No Bone: No Epithelialization: None None N/A Treatment Notes Electronic Signature(s) Signed: 06/25/2019 1:28:54 PM By: [name] Entered By: [name] on 06/25/2019 12:55:45 [name] ([id number]) -------------------------------------------------------------------------------- Multi-Disciplinary Care Plan Details Patient Name: Carol Games. Date of Service: 06/25/2019 12:30 PM Medical Record Number: [id number] Patient Account Number: [id number] Date of Birth/Sex: [birth year] (85 y.o. F) Treating RN: [name] Primary Care Daviel Allegretto: Park Liter Other Clinician: Referring Khang Hannum: Park Liter Treating Bowyn Mercier/Extender: [name], [name] Weeks in  Treatment: 15 Active Inactive Abuse / Safety / Falls / Self Care Management Nursing Diagnoses: Potential for falls Goals: Patient will not experience any injury related to falls Date Initiated: 03/10/2019 Target Resolution Date: 06/13/2019 Goal Status: Active Interventions: Assess fall risk on admission and as needed Notes: Orientation to the Wound Care Program Nursing Diagnoses: Knowledge deficit related to the wound healing center program Goals: Patient/caregiver will verbalize understanding of the [facility] Date Initiated: 03/10/2019 Target Resolution Date: 06/13/2019 Goal Status: Active Interventions: Provide education on orientation to the wound center Notes: Wound/Skin Impairment Nursing Diagnoses: Impaired tissue integrity Goals: Ulcer/skin breakdown will heal within 14 weeks Date Initiated: 03/10/2019 Target Resolution Date: 06/13/2019 Goal Status: Active Interventions: Assess patient/caregiver ability to obtain necessary supplies Kawabata, Megon H. ([id number]) Assess patient/caregiver ability to perform ulcer/skin care regimen upon admission and as needed Assess ulceration(s) every visit Notes: Electronic Signature(s) Signed: 06/25/2019 1:28:54 PM By: [name] Entered By: [name] on 06/25/2019 12:55:30 Wise, [name] ([id number]) -------------------------------------------------------------------------------- Pain Assessment Details Patient Name: Carol Games. Date of Service: 06/25/2019 12:30 PM Medical Record Number: [id number] Patient Account Number: [id number] Date of Birth/Sex: 12/28/1927 (85 y.o. F) Treating RN: [name] Primary Care Demarlo Riojas: Park Liter Other Clinician: Referring Reha Martinovich: Park Liter Treating Keylon Labelle/Extender: [name], [name] Weeks in Treatment: 15 Active Problems Location of Pain Severity and Description of Pain Patient Has Paino No Site Locations Pain Management and  Medication Current Pain Management: Electronic Signature(s) Signed: 06/25/2019 1:28:54 PM By: [name] Signed: 06/25/2019 4:02:16 PM By: Lorine Bears RCP, RRT, CHT Entered By: Lorine Bears on 06/25/2019 12:36:23 WADIYA, PHANN ([id number]) -------------------------------------------------------------------------------- Patient/Caregiver Education Details Patient Name: Carol Games. Date of Service: 06/25/2019 12:30 PM Medical Record Number: [id number] Patient Account Number: [id number] Date of Birth/Gender: 21-Jul-1928 (85 y.o. F) Treating RN: [name] Primary Care Physician: Park Liter Other Clinician: Referring Physician: Park Liter Treating Physician/Extender: Sharalyn Ink in Treatment: 15 Education Assessment Education Provided To: Patient Education Topics Provided Wound/Skin Impairment: Handouts: Caring for Your Ulcer Methods: Demonstration, Explain/Verbal Responses: State content correctly Electronic Signature(s) Signed: 06/25/2019 1:28:54 PM By: [name] Entered By: [name] on 06/25/2019 13:05:04 Wise, Carol. ([id number]) -------------------------------------------------------------------------------- Wound Assessment Details Patient Name: Brees, Chennel H. Date of Service: 06/25/2019 12:30 PM Medical Record Number: [id number] Patient Account Number: [id number] Date of Birth/Sex: 08-25-1928 (85 y.o. F) Treating RN: Harold Barban Primary Care Rillie Riffel: Park Liter Other Clinician: Referring Emidio Warrell: Park Liter Treating Quasean Frye/Extender: [name], [name] Weeks in Treatment: 15 Wound Status Wound Number: 16 Primary Skin Tear Etiology: Wound Location: Right Elbow Wound Open Wounding Event: Trauma Status: Date Acquired: 05/11/2019 Comorbid Cataracts, Glaucoma,  Asthma, Chronic Weeks Of Treatment: 6 History: Obstructive Pulmonary Disease (COPD), Clustered Wound:  No Arrhythmia, Hypertension Photos Wound Measurements Length: (cm) 0.2 Width: (cm) 0.2 Depth: (cm) 0.1 Area: (cm) 0.031 Volume: (cm) 0.003 % Reduction in Area: 98.1% % Reduction in Volume: 98.2% Epithelialization: None Tunneling: No Undermining: No Wound Description Full Thickness Without Exposed Support Foul Odo Classification: Structures Slough/F Wound Margin: Flat and Intact Exudate Medium Amount: Exudate Type: Serous Exudate Color: amber r After Cleansing: No ibrino Yes Wound Bed Granulation Amount: Large (67-100%) Exposed Structure Granulation Quality: Pink Fascia Exposed: No Necrotic Amount: None Present (0%) Fat Layer (Subcutaneous Tissue) Exposed: Yes Tendon Exposed: No Muscle Exposed: No Joint Exposed: No Bone Exposed: No Wise, [name] ([id number]) Treatment Notes Wound #16 (Right Elbow) Notes polymem, ABD, stretch net Electronic Signature(s) Signed: 06/25/2019 4:05:35 PM By: Harold Barban Entered By: Harold Barban on 06/25/2019 12:48:20 Wise, [name] ([id number]) -------------------------------------------------------------------------------- Wound Assessment Details Patient Name: Wise, [name] Date of Service: 06/25/2019 12:30 PM Medical Record Number: [id number] Patient Account Number: [id number] Date of Birth/Sex: 08-05-1928 (85 y.o. F) Treating RN: Harold Barban Primary Care Shelia Magallon: Park Liter Other Clinician: Referring Duvall Comes: Park Liter Treating Saniyyah Elster/Extender: [name], [name] Weeks in Treatment: 15 Wound Status Wound Number: 17 Primary Trauma, Other Etiology: Wound Location: Right Forearm - Anterior Wound Open Wounding Event: Trauma Status: Date Acquired: 06/23/2019 Comorbid Cataracts, Glaucoma, Asthma, Chronic Weeks Of Treatment: 0 History: Obstructive Pulmonary Disease (COPD), Clustered Wound: No Arrhythmia, Hypertension Photos Wound Measurements Length: (cm) 2 Width: (cm) 3 Depth:  (cm) 0.1 Area: (cm) 4.712 Volume: (cm) 0.471 % Reduction in Area: % Reduction in Volume: Epithelialization: None Tunneling: No Undermining: No Wound Description Full Thickness Without Exposed Support Foul Odo Classification: Structures Slough/F Wound Margin: Flat and Intact Exudate Medium Amount: Exudate Type: Serosanguineous Exudate Color: red, brown r After Cleansing: No ibrino No Wound Bed Granulation Amount: Small (1-33%) Exposed Structure Granulation Quality: Pink Fascia Exposed: No Necrotic Amount: Medium (34-66%) Fat Layer (Subcutaneous Tissue) Exposed: Yes Necrotic Quality: Eschar Tendon Exposed: No Muscle Exposed: No Joint Exposed: No Bone Exposed: No Wise, [name] ([id number]) Treatment Notes Wound #17 (Right, Anterior Forearm) Notes polymem, ABD, stretch net Electronic Signature(s) Signed: 06/25/2019 4:05:35 PM By: Harold Barban Entered By: Harold Barban on 06/25/2019 12:47:53 Wise, [name] ([id number]) -------------------------------------------------------------------------------- Vitals Details Patient Name: Carol Berger H. Date of Service: 06/25/2019 12:30 PM Medical Record Number: [id number] Patient Account Number: [id number] Date of Birth/Sex: 01/08/1928 (85 y.o. F) Treating RN: [name] Primary Care Heatherly Stenner: Park Liter Other Clinician: Referring Lynanne Delgreco: Park Liter Treating Abri Vacca/Extender: [name], [name] Weeks in Treatment: 15 Vital Signs Time Taken: 12:36 Temperature (F): 98.6 Height (in): 60 Pulse (bpm): 69 Weight (lbs): 92 Respiratory Rate (breaths/min): 16 Body Mass Index (BMI): 18 Blood Pressure (mmHg): 154/59 Reference Range: 80 - 120 mg / dl Electronic Signature(s) Signed: 06/25/2019 4:02:16 PM By: Lorine Bears RCP, RRT, CHT Entered By: Lorine Bears on 06/25/2019 12:39:06

## 2019-06-25 NOTE — Progress Notes (Addendum)
Carol, Wise ([id number]) Visit Report for 06/25/2019 Chief Complaint Document Details Patient Name: Carol, Wise. Date of Service: 06/25/2019 12:30 PM Medical Record Number: [id number] Patient Account Number: [id number] Date of Birth/Sex: 1928-02-06 (85 y.o. F) Treating RN: [name] Primary Care Provider: Park Liter Other Clinician: Referring Provider: Park Liter Treating Provider/Extender: [name], Earnestine Tuohey Weeks in Treatment: 15 Information Obtained from: Patient Chief Complaint Right elbow and forearm skin tears Electronic Signature(s) Signed: 06/25/2019 1:31:17 PM By: Worthy Keeler PA-C Previous Signature: 06/25/2019 12:52:45 PM Version By: Worthy Keeler PA-C Entered By: Worthy Keeler on 06/25/2019 13:31:17 [name], [name] ([id number]) -------------------------------------------------------------------------------- Debridement Details Patient Name: [name]. Date of Service: 06/25/2019 12:30 PM Medical Record Number: [id number] Patient Account Number: [id number] Date of Birth/Sex: Aug 20, 1928 (85 y.o. F) Treating RN: [name] Primary Care Provider: Park Liter Other Clinician: Referring Provider: Park Liter Treating Provider/Extender: [name], Mikkel Charrette Weeks in Treatment: 15 Debridement Performed for Wound #17 Right,Anterior Forearm Assessment: Performed By: Physician STONE III, Blasa Raisch E., PA-C Debridement Type: Debridement Level of Consciousness (Pre- Awake and Alert procedure): Pre-procedure Verification/Time Yes - 12:57 Out Taken: Start Time: 12:58 Pain Control: Lidocaine Total Area Debrided (L x W): 2 (cm) x 3 (cm) = 6 (cm) Tissue and other material Viable, Non-Viable, Skin: Dermis debrided: Level: Skin/Dermis Debridement Description: Selective/Open Wound Instrument: Forceps, Scissors Bleeding: Minimum Hemostasis Achieved: Pressure End Time: 12:59 Response to Treatment: Procedure was tolerated well Level  of Consciousness Awake and Alert (Post-procedure): Post Debridement Measurements of Total Wound Length: (cm) 2 Width: (cm) 3 Depth: (cm) 0.1 Volume: (cm) 0.471 Character of Wound/Ulcer Post Debridement: Stable Post Procedure Diagnosis Same as Pre-procedure Electronic Signature(s) Signed: 06/25/2019 1:28:54 PM By: [name] Signed: 06/25/2019 6:36:47 PM By: Worthy Keeler PA-C Entered By: [name] on 06/25/2019 12:59:19 [name], [name] ([id number]) -------------------------------------------------------------------------------- HPI Details Patient Name: Carol Berger H. Date of Service: 06/25/2019 12:30 PM Medical Record Number: [id number] Patient Account Number: [id number] Date of Birth/Sex: 1928/08/08 (85 y.o. F) Treating RN: [name] Primary Care Provider: Park Liter Other Clinician: Referring Provider: Park Liter Treating Provider/Extender: [name], Georgene Kopper Weeks in Treatment: 15 History of Present Illness HPI Description: 02/12/18 ADMISSION This is an 85 year old woman who is recently moved to [place] from [place]. Her story began in February where she fell on some ice suffering extensive lacerations to her bilateral lower extremities. She is able to show me extensive pictures of the right lower leg but with going to a wound care center and [place] 3 times a week these eventually closed over. She has been left with 1 area on the upper lateral left calf. She has been applying Neosporin to this and applying a bandage. Currently this measures 2 x 1.5 cm. The patient is not a diabetic. She is an ex-smoker quitting 40 years ago. She does have COPD by description. ABIs in our clinic were 1.32 on the right and 1.03 on the left 02/19/18; right lower leg wound which was initially trauma. The area that we look that last week is smaller. Still covered in a nonviable surface however. We are using Iodoflex 02/26/18; right lower leg wound which was initially  trauma in the setting of chronic venous insufficiency. Surface of the wound looks much better healthy granulation advancing epithelialization. We have good edema control 03/05/18; right lower leg wound which was initially trauma in the setting of chronic venous insufficiency. She continues to make nice progress here. We have good edema control oHe arrives today with  a new traumatic laceration on her right dorsal forearm which she states happened while she was putting on a sweatshirt 03/12/18; right lower leg wound as closed however it still looks vulnerable. She has chronic venous insufficiency. oThe new wound from last week a traumatic area on her right dorsal or arm unfortunately does not have a viable surface. I remove some nonviable skin and necrotic subcutaneous debris from the wound surface. Hemostasis with silver nitrate and direct pressure 03/19/18; right lower leg wound is closed. She has chronic venous insufficiency and will need ongoing compression oThe new wounds from 2 weeks ago on the right upper elbow is closed. The area on her right dorsal forearm continues to have a nonviable surface requiring debridement 03/26/18; we'll need to look into ongoing compression for her legs. 2 wounds on the right upper elbow is closed. The area on the right dorsal forearm looks better. Hydrofera Blue secondary to hypertrophic granulation 04/02/18; he has compression stockings although she did not wear them today. Severe chronic venous insufficiency. She has 2 wounds on the right upper elbow which I said were closed last week which actually are although they're very small and she has a smaller clean wound on the right dorsal forearm. We've been using Hydrofera Blue secondary to some upper granulation. All of this looks better 04/09/18-She is seen in follow up evaluation for a right elbow and right forearm skin tear. The right elbow is healed. We will continue with same treatment plan and she'll follow next  week 04/16/18-She is seen in follow-up evaluation for right forearm skin tear, this is essentially healed. We will cover with foam border and she will follow-up next week 04/23/18; the patient arrives with a new skin tear on her right dorsal arm just below the elbow. She got this while carrying a box. She has a linear skin tear. There is no depth I don't think this should've been sutured. There is a skin flap medially I'm not sure if this will be viable or not 04/30/18; the new skin tear from last week unfortunately has remained viable in terms of the skin flap. She has a small open area that looks healthy. Using moistened silver collagen 05/07/18; right dorsal arm skin tear. Nonviable tissue over the remnants of the wound. We've been using silver collagen 05/21/18; right dorsal arm skin tear. Nonviable tissue over a small remnant of the wound was washed off. We've been using silver collagen which we will continue 06/04/18; the right dorsal arm skin tear has healed. She arrives today with a traumatic wound just above the olecranon of her left elbow. This is a skin tear with a nonviable nonadherent flap which was removed. We'll use silver collagen here. Also noteworthy she arrives without her compression stockings 06/18/18; the area just above the olecranon of her left elbow. It is smaller and generally has a healthy surface. I was surprised JEANNINE, WORTHEY. ([id number]) to learn that the patient is actually changing this herself although. Appears this week she was putting some topical lidocaine on this that she bought over-the-counter at the drugstore. She did get supplies at home she didn't know how to put it on. She drove herself here today i.e. not eligible for home health Readmission: 11/04/18 on evaluation today patient presents for evaluation our clinic news to include the she has on her lower extremity which occurred as the result of having hit this on a piece of furniture. This was back in  December. She states that her daughter has  been trying to get her to come into have this evaluated here the wound care center but she was being somewhat stubborn. Nonetheless upon evaluation today the wound really appears to be potentially healed she does have some swelling of lower extremity which I think will come into play. With that being said he tells me that even yesterday this was still making clear fluid and therefore there may be some issues with continued problems with the swelling and drainage secondary to her venous stasis. Nonetheless overall wound appears to be doing fairly well which is good news. 11/11/18 on evaluation today patient actually appears to be doing excellent in the area of question that was green just the day before he saw her last time actually appears to have not drain that always in past week. I think this is done very well and at this point though she's had some discomfort I do not see any evidence of open wound which he needs to continue see wound care. Readmission: 03/10/19 on evaluation today patient presents for reevaluation here in our clinic concerning issues that she actually is having with the new problem on her right lower leg due to trauma that occurred when a car door shut on her leg last Thursday. This is not been quite a week and she does have several skin tears unfortunately that resulted from this injury. Fortunately there's no signs of active infection at this time. No fevers, chills, nausea, or vomiting noted at this time. She does have some hot tissue noted at this point that has me concerned about the possibility of needing to move this in order to allow the areas to heal appropriately. Fortunately there's no signs of active infection at this time. 03/17/19-Patient returns to clinic at 1 week, we have been using Prisma on the larger wounds with Kerlix and Xeroform. As many as 5 wounds were number last time 3 of which have closed, leaving the right  proximal tibial and right distal medial leg wound both of which are looking good 03/24/19 on evaluation today patient appears to be doing better in regard to her wounds at this time. Fortunately there is no sign of active infection at this time. No fevers chills noted. She's been tolerating the dressing changes without complication which is good news she's been performing these herself it sounds like home health is not going to be coming out it sounds as if they feel like she may be able to take care of yourself and not be technically homebound. Nonetheless though that is unfortunate it does seem like she's done fairly well with taking care of yourself from the standpoint of the dressings. 03/31/19 on evaluation today patient actually appears to be doing well with regard to her lower extremity ulcers. She had one area to heal today and in regard to her right lower extremity were she continues to have an open wound I did have to perform some sharp debridement today. Subsequently the patient still has an area of what appears to be possibly a small hematoma on the anterior portion of her left lower extremity. This is not open enough at this time but nonetheless does seem to show some evidence of hardening which is not necessarily a bad thing. Eventually this may pop off but again I'm not interested in really removing this prematurely. 04/07/19 on evaluation today patient actually appears to be doing well with regard to her lower extremity ulcers. She is been tolerating the dressing changes without complication. She seems to be  making excellent progress which is great news. She does have one small new skin tear area but the good news is she was able to pull the skin back over and it really looks like it is healing quite nicely. Overall that is just a very small region and again seems to be healing nicely. 04/14/19 upon evaluation today patient actually appears to be doing quite well with regard to the wounds  on the right lower extremity. In fact everything appears to be healed as of today. This is excellent news. She does however have the area on the left lower extremity which is showing some signs of loosening up we have been watching this area it is no longer fluctuant and in fact with the Betadine on it it is been slowly dry now but is now lifting up on the edge. There may be a wound underneath this region. 04/21/2019 on evaluation today patient appears to be doing excellent in regard to her bilateral lower extremities. In fact she has been tolerating the dressing changes without complication does not seem to show any signs of infection at this point. In fact based on what I am seeing currently I think she is completely healed. KARLI, WESTBERRY ([id number]) 05/04/2019 on evaluation today patient actually appears to be doing quite well with regard to her right elbow. She has been tolerating the dressing changes without complication. Fortunately there is no signs of active infection at this time. Overall I am very pleased with how things are progressing. The elbow almost appears to be completely healed though it is not 100% today. 05/14/2019 on evaluation today patient actually appears to be doing well with regard to her elbow skin tear that we have been treating in fact this appears to be completely healed. Unfortunately she has a new skin tear noted as of today which actually occurred in the last week since I previously saw her. This again is unfortunate that it has been so long because I am not going to be able to actually pull the skin over where you have to trim this away and let it heal by second intent. This new wound is in the right elbow region as well but again separate from the original ulcer. 05/19/2019 on evaluation today patient appears to be doing well with regard to her skin tear on her elbow. She has been tolerating the dressing changes without complication. Fortunately there is no  signs of active infection at this time. No fever chills noted. Overall I am very pleased with how things appear today. She is not having any significant pain which is also excellent news. 05/26/2019 on evaluation today patient actually appears to be doing well with regard to her right elbow ulcer. She has been tolerating the dressing changes without complication. Fortunately there is no signs of active infection at this time. No fevers, chills, nausea, vomiting, or diarrhea. 06/02/2019 on evaluation today patient appears to be doing very well with regard to her wound of the right elbow. In fact there is just a very small opening still remaining for the most part this is completely closed and appears to be doing excellent. There is no signs of active infection at this time which is also good news. No fevers, chills, nausea, vomiting, or diarrhea. 06/09/2019 on evaluation today patient appears to actually be doing excellent with regard to her original wound on her elbow which really appears to be completely healed at this point. With that being said she is having issues with  an area generally of weeping in this region which I am unsure exactly what is causing this. Does not appear to be obviously bacterially infected although that could be part of the issue. With that being said I do believe that there is also a chance there could be a fungal infection here. 06/18/2019 on evaluation today patient's elbow ulcer appears to be doing much better I do believe this was more likely a fungal infection that was going on at the last visit and fortunately she seems to be doing much better at this point. With that being said this is not completely healed but I am hopeful will be so by next week. She is very pleased that is really not having any pain in regard to the elbow. 06/25/2019 upon evaluation today patient appears to be doing well with regard to her elbow ulcer that we have been taking care of unfortunately  she has a new skin tear on her forearm which has arisen since I last saw her. This she states resulted from a fall that she had tried to get out of her bathtub on Tuesday. Obviously this is not good news she continues to have significant falls which I am very concerned about to be perfectly honest. I explained to her that if she is falling she runs the risk of injuring her head or potentially breaking something that could be more significant which she states she understands but she just "tries not to think about it". I explained to her that I feel like this is something that she absolutely needs to think about in order to protect herself.-Suggested she may want to consider an assisted living facility she is in an apartment complex where she does have friends she really does not want to move out I therefore told her that at minimum I would recommend that she get in touch with her daughter and attempt if at all possible to have her contact her each day which she states they typically do anyway and if she is unable to get in touch with her that she subsequently call someone to check on her mother and again if they are not able to then have a way to be able to get into her apartment to check on her in case something untoward happens. Electronic Signature(s) Signed: 06/25/2019 1:31:36 PM By: Worthy Keeler PA-C Previous Signature: 06/25/2019 1:30:17 PM Version By: Worthy Keeler PA-C Entered By: Worthy Keeler on 06/25/2019 13:31:36 Ovando, [name] ([id number]) -------------------------------------------------------------------------------- Physical Exam Details Patient Name: [name], [name] Date of Service: 06/25/2019 12:30 PM Medical Record Number: [id number] Patient Account Number: [id number] Date of Birth/Sex: September 25, 1927 (85 y.o. F) Treating RN: [name] Primary Care Provider: Park Liter Other Clinician: Referring Provider: Park Liter Treating Provider/Extender: [name], Carvell Hoeffner Weeks in Treatment: 60 Constitutional Well-nourished and well-hydrated in no acute distress. Respiratory normal breathing without difficulty. Psychiatric this patient is able to make decisions and demonstrates good insight into disease process. Alert and Oriented x 3. pleasant and cooperative. Notes Upon evaluation of the patient's wound bed again the ulcer that we have been managing on the elbow actually appears to be doing significantly better which is great news this is on the right. Unfortunately she has a new skin tear on the forearm. She attempted to pull the skin back down over but to be honest with somewhat unsuccessful with regard to getting the entire skin region pulled back over part of this did fold under and unfortunately is becoming  necrotic it is continued to be trimmed away today. I did actually perform sharp debridement to clear away the edges of the skin at these locations which she tolerated with minimal discomfort post debridement the wounds appear to be doing better as far as the margin is concerned I think this has a good chance to heal. Electronic Signature(s) Signed: 06/25/2019 1:32:05 PM By: Worthy Keeler PA-C Entered By: Worthy Keeler on 06/25/2019 13:32:05 [name], [name] ([id number]) -------------------------------------------------------------------------------- Physician Orders Details Patient Name: Carol Berger H. Date of Service: 06/25/2019 12:30 PM Medical Record Number: [id number] Patient Account Number: [id number] Date of Birth/Sex: August 09, 1928 (85 y.o. F) Treating RN: [name] Primary Care Provider: Park Liter Other Clinician: Referring Provider: Park Liter Treating Provider/Extender: [name], Myleka Moncure Weeks in Treatment: 15 Verbal / Phone Orders: No Diagnosis Coding ICD-10 Coding Code Description I87.2 Venous insufficiency (chronic) (peripheral) S51.001A Unspecified open wound of right elbow, initial  encounter Wound Cleansing Wound #16 Right Elbow o Clean wound with Normal Saline. Wound #17 Right,Anterior Forearm o Clean wound with Normal Saline. Primary Wound Dressing Wound #16 Right Elbow o ABD Pad - over polymem o Other: - polymem Wound #17 Right,Anterior Forearm o ABD Pad - over polymem o Other: - polymem Secondary Dressing Wound #16 Right Elbow o ABD pad o Other - stetch net Wound #17 Right,Anterior Forearm o ABD pad o Other - stetch net Dressing Change Frequency Wound #16 Right Elbow o Three times weekly Wound #17 Right,Anterior Forearm o Three times weekly Follow-up Appointments Wound #16 Right Elbow o Return Appointment in 2 weeks. Wound #17 Right,Anterior Forearm o Return Appointment in 2 weeks. Carol, Wise ([id number]) Electronic Signature(s) Signed: 06/25/2019 1:28:54 PM By: [name] Signed: 06/25/2019 6:36:47 PM By: Worthy Keeler PA-C Entered By: [name] on 06/25/2019 13:04:06 [name], [name] ([id number]) -------------------------------------------------------------------------------- Problem List Details Patient Name: [name], [name] Date of Service: 06/25/2019 12:30 PM Medical Record Number: [id number] Patient Account Number: [id number] Date of Birth/Sex: 04-26-1928 (85 y.o. F) Treating RN: [name] Primary Care Provider: Park Liter Other Clinician: Referring Provider: Park Liter Treating Provider/Extender: [name], Leonilda Cozby Weeks in Treatment: 15 Active Problems ICD-10 Evaluated Encounter Code Description Active Date Today Diagnosis I87.2 Venous insufficiency (chronic) (peripheral) 03/10/2019 No Yes S51.001A Unspecified open wound of right elbow, initial encounter 04/28/2019 No Yes S51.801A Unspecified open wound of right forearm, initial encounter 06/25/2019 No Yes Inactive Problems Resolved Problems ICD-10 Code Description Active Date Resolved Date L97.812 Non-pressure chronic  ulcer of other part of right lower leg with fat 03/10/2019 03/10/2019 layer exposed Electronic Signature(s) Signed: 06/25/2019 1:31:00 PM By: Worthy Keeler PA-C Previous Signature: 06/25/2019 12:52:31 PM Version By: Worthy Keeler PA-C Entered By: Worthy Keeler on 06/25/2019 13:30:59 [name], [name] ([id number]) -------------------------------------------------------------------------------- Progress Note Details Patient Name: [name], [name] Date of Service: 06/25/2019 12:30 PM Medical Record Number: [id number] Patient Account Number: [id number] Date of Birth/Sex: [birth year] (85 y.o. F) Treating RN: [name] Primary Care Provider: Park Liter Other Clinician: Referring Provider: Park Liter Treating Provider/Extender: [name], Shye Doty Weeks in Treatment: 15 Subjective Chief Complaint Information obtained from Patient Right elbow and forearm skin tears History of Present Illness (HPI) 02/12/18 ADMISSION This is an 85 year old woman who is recently moved to [place] from [place]. Her story began in February where she fell on some ice suffering extensive lacerations to her bilateral lower extremities. She is able to show me extensive pictures of the right lower leg but with going to a wound care  center and [place] 3 times a week these eventually closed over. She has been left with 1 area on the upper lateral left calf. She has been applying Neosporin to this and applying a bandage. Currently this measures 2 x 1.5 cm. The patient is not a diabetic. She is an ex-smoker quitting 40 years ago. She does have COPD by description. ABIs in our clinic were 1.32 on the right and 1.03 on the left 02/19/18; right lower leg wound which was initially trauma. The area that we look that last week is smaller. Still covered in a nonviable surface however. We are using Iodoflex 02/26/18; right lower leg wound which was initially trauma in the setting of chronic venous insufficiency.  Surface of the wound looks much better healthy granulation advancing epithelialization. We have good edema control 03/05/18; right lower leg wound which was initially trauma in the setting of chronic venous insufficiency. She continues to make nice progress here. We have good edema control He arrives today with a new traumatic laceration on her right dorsal forearm which she states happened while she was putting on a sweatshirt 03/12/18; right lower leg wound as closed however it still looks vulnerable. She has chronic venous insufficiency. The new wound from last week a traumatic area on her right dorsal or arm unfortunately does not have a viable surface. I remove some nonviable skin and necrotic subcutaneous debris from the wound surface. Hemostasis with silver nitrate and direct pressure 03/19/18; right lower leg wound is closed. She has chronic venous insufficiency and will need ongoing compression The new wounds from 2 weeks ago on the right upper elbow is closed. The area on her right dorsal forearm continues to have a nonviable surface requiring debridement 03/26/18; we'll need to look into ongoing compression for her legs. 2 wounds on the right upper elbow is closed. The area on the right dorsal forearm looks better. Hydrofera Blue secondary to hypertrophic granulation 04/02/18; he has compression stockings although she did not wear them today. Severe chronic venous insufficiency. She has 2 wounds on the right upper elbow which I said were closed last week which actually are although they're very small and she has a smaller clean wound on the right dorsal forearm. We've been using Hydrofera Blue secondary to some upper granulation. All of this looks better 04/09/18-She is seen in follow up evaluation for a right elbow and right forearm skin tear. The right elbow is healed. We will continue with same treatment plan and she'll follow next week 04/16/18-She is seen in follow-up evaluation for right  forearm skin tear, this is essentially healed. We will cover with foam border and she will follow-up next week 04/23/18; the patient arrives with a new skin tear on her right dorsal arm just below the elbow. She got this while carrying a box. She has a linear skin tear. There is no depth I don't think this should've been sutured. There is a skin flap medially I'm not sure if this will be viable or not 04/30/18; the new skin tear from last week unfortunately has remained viable in terms of the skin flap. She has a small open area that looks healthy. Using moistened silver collagen 05/07/18; right dorsal arm skin tear. Nonviable tissue over the remnants of the wound. We've been using silver collagen [name], FLANERY ([id number]) 05/21/18; right dorsal arm skin tear. Nonviable tissue over a small remnant of the wound was washed off. We've been using silver collagen which we will continue 06/04/18; the right  dorsal arm skin tear has healed. She arrives today with a traumatic wound just above the olecranon of her left elbow. This is a skin tear with a nonviable nonadherent flap which was removed. We'll use silver collagen here. Also noteworthy she arrives without her compression stockings 06/18/18; the area just above the olecranon of her left elbow. It is smaller and generally has a healthy surface. I was surprised to learn that the patient is actually changing this herself although. Appears this week she was putting some topical lidocaine on this that she bought over-the-counter at the drugstore. She did get supplies at home she didn't know how to put it on. She drove herself here today i.e. not eligible for home health Readmission: 11/04/18 on evaluation today patient presents for evaluation our clinic news to include the she has on her lower extremity which occurred as the result of having hit this on a piece of furniture. This was back in December. She states that her daughter has been trying to get  her to come into have this evaluated here the wound care center but she was being somewhat stubborn. Nonetheless upon evaluation today the wound really appears to be potentially healed she does have some swelling of lower extremity which I think will come into play. With that being said he tells me that even yesterday this was still making clear fluid and therefore there may be some issues with continued problems with the swelling and drainage secondary to her venous stasis. Nonetheless overall wound appears to be doing fairly well which is good news. 11/11/18 on evaluation today patient actually appears to be doing excellent in the area of question that was green just the day before he saw her last time actually appears to have not drain that always in past week. I think this is done very well and at this point though she's had some discomfort I do not see any evidence of open wound which he needs to continue see wound care. Readmission: 03/10/19 on evaluation today patient presents for reevaluation here in our clinic concerning issues that she actually is having with the new problem on her right lower leg due to trauma that occurred when a car door shut on her leg last Thursday. This is not been quite a week and she does have several skin tears unfortunately that resulted from this injury. Fortunately there's no signs of active infection at this time. No fevers, chills, nausea, or vomiting noted at this time. She does have some hot tissue noted at this point that has me concerned about the possibility of needing to move this in order to allow the areas to heal appropriately. Fortunately there's no signs of active infection at this time. 03/17/19-Patient returns to clinic at 1 week, we have been using Prisma on the larger wounds with Kerlix and Xeroform. As many as 5 wounds were number last time 3 of which have closed, leaving the right proximal tibial and right distal medial leg wound both of which  are looking good 03/24/19 on evaluation today patient appears to be doing better in regard to her wounds at this time. Fortunately there is no sign of active infection at this time. No fevers chills noted. She's been tolerating the dressing changes without complication which is good news she's been performing these herself it sounds like home health is not going to be coming out it sounds as if they feel like she may be able to take care of yourself and not be technically  homebound. Nonetheless though that is unfortunate it does seem like she's done fairly well with taking care of yourself from the standpoint of the dressings. 03/31/19 on evaluation today patient actually appears to be doing well with regard to her lower extremity ulcers. She had one area to heal today and in regard to her right lower extremity were she continues to have an open wound I did have to perform some sharp debridement today. Subsequently the patient still has an area of what appears to be possibly a small hematoma on the anterior portion of her left lower extremity. This is not open enough at this time but nonetheless does seem to show some evidence of hardening which is not necessarily a bad thing. Eventually this may pop off but again I'm not interested in really removing this prematurely. 04/07/19 on evaluation today patient actually appears to be doing well with regard to her lower extremity ulcers. She is been tolerating the dressing changes without complication. She seems to be making excellent progress which is great news. She does have one small new skin tear area but the good news is she was able to pull the skin back over and it really looks like it is healing quite nicely. Overall that is just a very small region and again seems to be healing nicely. 04/14/19 upon evaluation today patient actually appears to be doing quite well with regard to the wounds on the right lower extremity. In fact everything appears to be  healed as of today. This is excellent news. She does however have the area on the left lower extremity which is showing some signs of loosening up we have been watching this area it is no longer fluctuant and in fact with the Betadine on it it is been slowly dry now but is now lifting up on the edge. There may be a wound underneath [name], [name] this region. 04/21/2019 on evaluation today patient appears to be doing excellent in regard to her bilateral lower extremities. In fact she has been tolerating the dressing changes without complication does not seem to show any signs of infection at this point. In fact based on what I am seeing currently I think she is completely healed. 05/04/2019 on evaluation today patient actually appears to be doing quite well with regard to her right elbow. She has been tolerating the dressing changes without complication. Fortunately there is no signs of active infection at this time. Overall I am very pleased with how things are progressing. The elbow almost appears to be completely healed though it is not 100% today. 05/14/2019 on evaluation today patient actually appears to be doing well with regard to her elbow skin tear that we have been treating in fact this appears to be completely healed. Unfortunately she has a new skin tear noted as of today which actually occurred in the last week since I previously saw her. This again is unfortunate that it has been so long because I am not going to be able to actually pull the skin over where you have to trim this away and let it heal by second intent. This new wound is in the right elbow region as well but again separate from the original ulcer. 05/19/2019 on evaluation today patient appears to be doing well with regard to her skin tear on her elbow. She has been tolerating the dressing changes without complication. Fortunately there is no signs of active infection at this time. No fever chills noted.  Overall I  am very pleased with how things appear today. She is not having any significant pain which is also excellent news. 05/26/2019 on evaluation today patient actually appears to be doing well with regard to her right elbow ulcer. She has been tolerating the dressing changes without complication. Fortunately there is no signs of active infection at this time. No fevers, chills, nausea, vomiting, or diarrhea. 06/02/2019 on evaluation today patient appears to be doing very well with regard to her wound of the right elbow. In fact there is just a very small opening still remaining for the most part this is completely closed and appears to be doing excellent. There is no signs of active infection at this time which is also good news. No fevers, chills, nausea, vomiting, or diarrhea. 06/09/2019 on evaluation today patient appears to actually be doing excellent with regard to her original wound on her elbow which really appears to be completely healed at this point. With that being said she is having issues with an area generally of weeping in this region which I am unsure exactly what is causing this. Does not appear to be obviously bacterially infected although that could be part of the issue. With that being said I do believe that there is also a chance there could be a fungal infection here. 06/18/2019 on evaluation today patient's elbow ulcer appears to be doing much better I do believe this was more likely a fungal infection that was going on at the last visit and fortunately she seems to be doing much better at this point. With that being said this is not completely healed but I am hopeful will be so by next week. She is very pleased that is really not having any pain in regard to the elbow. 06/25/2019 upon evaluation today patient appears to be doing well with regard to her elbow ulcer that we have been taking care of unfortunately she has a new skin tear on her forearm which has arisen since I  last saw her. This she states resulted from a fall that she had tried to get out of her bathtub on Tuesday. Obviously this is not good news she continues to have significant falls which I am very concerned about to be perfectly honest. I explained to her that if she is falling she runs the risk of injuring her head or potentially breaking something that could be more significant which she states she understands but she just "tries not to think about it". I explained to her that I feel like this is something that she absolutely needs to think about in order to protect herself.-Suggested she may want to consider an assisted living facility she is in an apartment complex where she does have friends she really does not want to move out I therefore told her that at minimum I would recommend that she get in touch with her daughter and attempt if at all possible to have her contact her each day which she states they typically do anyway and if she is unable to get in touch with her that she subsequently call someone to check on her mother and again if they are not able to then have a way to be able to get into her apartment to check on her in case something untoward happens. Patient History Information obtained from Patient. Family History Cancer - Siblings,Paternal Grandparents, Diabetes - Siblings, Heart Disease - Mother,Father,Child,Paternal Tyana, Starmer, Anvitha H. ([id number]) Hypertension - Child, No family history of Hereditary Spherocytosis, Kidney Disease, Lung  Disease, Seizures, Stroke, Thyroid Problems, Tuberculosis. Social History Former smoker - quit 40 yrs ago, Marital Status - Divorced, Alcohol Use - Never, Drug Use - No History, Caffeine Use - Rarely. Medical History Eyes Patient has history of Cataracts - surgery 2000, Glaucoma Respiratory Patient has history of Asthma, Chronic Obstructive Pulmonary Disease (COPD) Denies history of Pneumothorax, Sleep Apnea,  Tuberculosis Cardiovascular Patient has history of Arrhythmia - a-fib, Hypertension Denies history of Angina, Congestive Heart Failure, Coronary Artery Disease, Deep Vein Thrombosis, Hypotension, Myocardial Infarction, Peripheral Arterial Disease, Peripheral Venous Disease, Phlebitis, Vasculitis Endocrine Denies history of Type I Diabetes, Type II Diabetes Genitourinary Denies history of End Stage Renal Disease Immunological Denies history of Lupus Erythematosus, Raynaud s, Scleroderma Integumentary (Skin) Denies history of History of Burn, History of pressure wounds Musculoskeletal Denies history of Gout, Rheumatoid Arthritis, Osteoarthritis, Osteomyelitis Neurologic Denies history of Dementia, Neuropathy, Quadriplegia, Paraplegia, Seizure Disorder Oncologic Denies history of Received Chemotherapy, Received Radiation Psychiatric Denies history of Anorexia/bulimia, Confinement Anxiety Hospitalization/Surgery History - Colon cancer. Review of Systems (ROS) Constitutional Symptoms (General Health) Denies complaints or symptoms of Fatigue, Fever, Chills, Marked Weight Change. Respiratory Denies complaints or symptoms of Chronic or frequent coughs, Shortness of Breath. Cardiovascular Denies complaints or symptoms of Chest pain, LE edema. Psychiatric Denies complaints or symptoms of Anxiety, Claustrophobia. Objective Constitutional Well-nourished and well-hydrated in no acute distress. PATRICA, SPRATT ([id number]) Vitals Time Taken: 12:36 PM, Height: 60 in, Weight: 92 lbs, BMI: 18, Temperature: 98.6 F, Pulse: 69 bpm, Respiratory Rate: 16 breaths/min, Blood Pressure: 154/59 mmHg. Respiratory normal breathing without difficulty. Psychiatric this patient is able to make decisions and demonstrates good insight into disease process. Alert and Oriented x 3. pleasant and cooperative. General Notes: Upon evaluation of the patient's wound bed again the ulcer that we have been  managing on the elbow actually appears to be doing significantly better which is great news this is on the right. Unfortunately she has a new skin tear on the forearm. She attempted to pull the skin back down over but to be honest with somewhat unsuccessful with regard to getting the entire skin region pulled back over part of this did fold under and unfortunately is becoming necrotic it is continued to be trimmed away today. I did actually perform sharp debridement to clear away the edges of the skin at these locations which she tolerated with minimal discomfort post debridement the wounds appear to be doing better as far as the margin is concerned I think this has a good chance to heal. Integumentary (Hair, Skin) Wound #16 status is Open. Original cause of wound was Trauma. The wound is located on the Right Elbow. The wound measures 0.2cm length x 0.2cm width x 0.1cm depth; 0.031cm^2 area and 0.003cm^3 volume. There is Fat Layer (Subcutaneous Tissue) Exposed exposed. There is no tunneling or undermining noted. There is a medium amount of serous drainage noted. The wound margin is flat and intact. There is large (67-100%) pink granulation within the wound bed. There is no necrotic tissue within the wound bed. Wound #17 status is Open. Original cause of wound was Trauma. The wound is located on the Right,Anterior Forearm. The wound measures 2cm length x 3cm width x 0.1cm depth; 4.712cm^2 area and 0.471cm^3 volume. There is Fat Layer (Subcutaneous Tissue) Exposed exposed. There is no tunneling or undermining noted. There is a medium amount of serosanguineous drainage noted. The wound margin is flat and intact. There is small (1-33%) pink granulation within the wound bed. There is a  medium (34-66%) amount of necrotic tissue within the wound bed including Eschar. Assessment Active Problems ICD-10 Venous insufficiency (chronic) (peripheral) Unspecified open wound of right elbow, initial  encounter Unspecified open wound of right forearm, initial encounter Procedures Wound #17 Pre-procedure diagnosis of Wound #17 is a Trauma, Other located on the Right,Anterior Forearm . There was a Selective/Open Wound Skin/Dermis Debridement with a total area of 6 sq cm performed by STONE III, Tianah Lonardo E., PA-C. With the following instrument(s): Forceps, and Scissors to remove Viable and Non-Viable tissue/material. Material removed includes Skin: Dermis after achieving pain control using Lidocaine. A time out was conducted at 12:57, prior to the start of the BRIANNI, GONZALO. ([id number]) procedure. A Minimum amount of bleeding was controlled with Pressure. The procedure was tolerated well. Post Debridement Measurements: 2cm length x 3cm width x 0.1cm depth; 0.471cm^3 volume. Character of Wound/Ulcer Post Debridement is stable. Post procedure Diagnosis Wound #17: Same as Pre-Procedure Plan Wound Cleansing: Wound #16 Right Elbow: Clean wound with Normal Saline. Wound #17 Right,Anterior Forearm: Clean wound with Normal Saline. Primary Wound Dressing: Wound #16 Right Elbow: ABD Pad - over polymem Other: - polymem Wound #17 Right,Anterior Forearm: ABD Pad - over polymem Other: - polymem Secondary Dressing: Wound #16 Right Elbow: ABD pad Other - stetch net Wound #17 Right,Anterior Forearm: ABD pad Other - stetch net Dressing Change Frequency: Wound #16 Right Elbow: Three times weekly Wound #17 Right,Anterior Forearm: Three times weekly Follow-up Appointments: Wound #16 Right Elbow: Return Appointment in 2 weeks. Wound #17 Right,Anterior Forearm: Return Appointment in 2 weeks. 1. My suggestion at this point is good to be that we go ahead and continue to manage the patient's wounds both the elbow as well as the new wound on the right forearm. We can use PolyMem at both locations. 2. I am getting suggest that we cover this with an ABD pad and stretch netting for protection. 3.  With regard to her falls I had a long conversation with her today concerning the fact that I do believe she needs to get in touch with her daughter to figure out a plan as far as being able to monitor her very closely as I do believe she is at great risk of having a significant adverse event due to following. She seems to fall quite frequently unfortunately. The patient seems somewhat reluctant to do this she does not want to leave her home which I completely understand but I am also concerned for her safety. She voiced understanding. We will see patient back for reevaluation in 2 weeks here in the clinic. If anything worsens or changes patient will contact our office for additional recommendations. KALLIE, DIETERICH ([id number]) Electronic Signature(s) Signed: 06/25/2019 1:33:18 PM By: Worthy Keeler PA-C Entered By: Worthy Keeler on 06/25/2019 13:33:17 MARCENE, SCHIEBEL ([id number]) -------------------------------------------------------------------------------- ROS/PFSH Details Patient Name: [name]. Date of Service: 06/25/2019 12:30 PM Medical Record Number: [id number] Patient Account Number: [id number] Date of Birth/Sex: 13-Dec-1927 (85 y.o. F) Treating RN: [name] Primary Care Provider: Park Liter Other Clinician: Referring Provider: Park Liter Treating Provider/Extender: [name], Keari Miu Weeks in Treatment: 15 Information Obtained From Patient Constitutional Symptoms (General Health) Complaints and Symptoms: Negative for: Fatigue; Fever; Chills; Marked Weight Change Respiratory Complaints and Symptoms: Negative for: Chronic or frequent coughs; Shortness of Breath Medical History: Positive for: Asthma; Chronic Obstructive Pulmonary Disease (COPD) Negative for: Pneumothorax; Sleep Apnea; Tuberculosis Cardiovascular Complaints and Symptoms: Negative for: Chest pain; LE edema Medical History: Positive for: Arrhythmia -  a-fib;  Hypertension Negative for: Angina; Congestive Heart Failure; Coronary Artery Disease; Deep Vein Thrombosis; Hypotension; Myocardial Infarction; Peripheral Arterial Disease; Peripheral Venous Disease; Phlebitis; Vasculitis Psychiatric Complaints and Symptoms: Negative for: Anxiety; Claustrophobia Medical History: Negative for: Anorexia/bulimia; Confinement Anxiety Eyes Medical History: Positive for: Cataracts - surgery 2000; Glaucoma Endocrine Medical History: Negative for: Type I Diabetes; Type II Diabetes Genitourinary Medical History: Negative for: End Stage Renal Disease Blankenbaker, Tyne H. ([id number]) Immunological Medical History: Negative for: Lupus Erythematosus; Raynaudos; Scleroderma Integumentary (Skin) Medical History: Negative for: History of Burn; History of pressure wounds Musculoskeletal Medical History: Negative for: Gout; Rheumatoid Arthritis; Osteoarthritis; Osteomyelitis Neurologic Medical History: Negative for: Dementia; Neuropathy; Quadriplegia; Paraplegia; Seizure Disorder Oncologic Medical History: Negative for: Received Chemotherapy; Received Radiation HBO Extended History Items Eyes: Eyes: Cataracts Glaucoma Immunizations Pneumococcal Vaccine: Received Pneumococcal Vaccination: Yes Implantable Devices None Hospitalization / Surgery History Type of Hospitalization/Surgery Colon cancer Family and Social History Cancer: Yes - Siblings,Paternal Grandparents; Diabetes: Yes - Siblings; Heart Disease: Yes - Mother,Father,Child,Paternal Grandparents; Hereditary Spherocytosis: No; Hypertension: Yes - Child; Kidney Disease: No; Lung Disease: No; Seizures: No; Stroke: No; Thyroid Problems: No; Tuberculosis: No; Former smoker - quit 40 yrs ago; Marital Status - Divorced; Alcohol Use: Never; Drug Use: No History; Caffeine Use: Rarely; Financial Concerns: No; Food, Clothing or Shelter Needs: No; Support System Lacking: No; Transportation Concerns:  No Physician Affirmation I have reviewed and agree with the above information. Electronic Signature(s) Signed: 06/25/2019 4:16:03 PM By: [name] Signed: 06/25/2019 6:36:47 PM By: Worthy Keeler PA-C Entered By: Worthy Keeler on 06/25/2019 13:31:51 Gianino, Gelena HMarland Kitchen ([id number]) -------------------------------------------------------------------------------- SuperBill Details Patient Name: Carol Berger H. Date of Service: 06/25/2019 Medical Record Number: [id number] Patient Account Number: [id number] Date of Birth/Sex: 15-Dec-1927 (85 y.o. F) Treating RN: [name] Primary Care Provider: Park Liter Other Clinician: Referring Provider: Park Liter Treating Provider/Extender: [name], Lindey Renzulli Weeks in Treatment: 15 Diagnosis Coding ICD-10 Codes Code Description I87.2 Venous insufficiency (chronic) (peripheral) S51.001A Unspecified open wound of right elbow, initial encounter S51.801A Unspecified open wound of right forearm, initial encounter Facility Procedures CPT4 Code: [id number] Description: [phone] - [facility]BR[facility] WOUND 1ST 20 SQ CM OR < ICD-10 Diagnosis Description S51.801A Unspecified open wound of right forearm, initial encounter Modifier: Quantity: 1 Physician Procedures CPT4 [id number] Description: [id number] - WC PHYS LEVEL 4 - EST PT ICD-10 Diagnosis Description I87.2 Venous insufficiency (chronic) (peripheral) S51.001A Unspecified open wound of right elbow, initial encounter S51.801A Unspecified open wound of right forearm, initial  encounter Modifier: 25 Quantity: 1 CPT4 Code: [id number] Description: [place] - WC PHYS DEBR WO ANESTH 20 SQ CM ICD-10 Diagnosis Description S51.801A Unspecified open wound of right forearm, initial encounter Modifier: Quantity: 1 Electronic Signature(s) Signed: 06/25/2019 1:33:39 PM By: Worthy Keeler PA-C Entered By: Worthy Keeler on 06/25/2019 13:33:39

## 2019-06-26 ENCOUNTER — Other Ambulatory Visit: Payer: Self-pay | Admitting: Family Medicine

## 2019-06-30 ENCOUNTER — Telehealth: Payer: Self-pay

## 2019-06-30 ENCOUNTER — Ambulatory Visit: Payer: Self-pay | Admitting: Licensed Clinical Social Worker

## 2019-06-30 NOTE — Chronic Care Management (AMB) (Signed)
  Care Management   Follow Up Note   06/30/2019 Name: LATAZIA CHAMBER MRN: [id number] DOB: [birth year]  Referred by: Valerie Roys, DO Reason for referral : [place] is a 85 y.o. year old female who is a primary care patient of Valerie Roys, DO. The care management team was consulted for assistance with care management and care coordination needs.    Review of patient status, including review of consultants reports, relevant laboratory and other test results, and collaboration with appropriate care team members and the patient's provider was performed as part of comprehensive patient evaluation and provision of chronic care management services.    LCSW completed CCM outreach attempt today but was unable to reach patient successfully. A HIPPA compliant voice message was left encouraging patient to return call once available. LCSW rescheduled CCM SW appointment as well.  A HIPPA compliant phone message was left for the patient providing contact information and requesting a return [email or web address]   Eula Fried, BSW, MSW, [facility] [email or web address] Phone: [phone]

## 2019-07-03 ENCOUNTER — Ambulatory Visit: Payer: Medicare Other | Admitting: Family Medicine

## 2019-07-09 ENCOUNTER — Encounter: Payer: Medicare Other | Attending: Physician Assistant | Admitting: Physician Assistant

## 2019-07-09 ENCOUNTER — Other Ambulatory Visit: Payer: Self-pay

## 2019-07-09 DIAGNOSIS — Y939 Activity, unspecified: Secondary | ICD-10-CM | POA: Diagnosis not present

## 2019-07-09 DIAGNOSIS — I872 Venous insufficiency (chronic) (peripheral): Secondary | ICD-10-CM | POA: Diagnosis not present

## 2019-07-09 DIAGNOSIS — S51811A Laceration without foreign body of right forearm, initial encounter: Secondary | ICD-10-CM | POA: Insufficient documentation

## 2019-07-09 DIAGNOSIS — W1830XA Fall on same level, unspecified, initial encounter: Secondary | ICD-10-CM | POA: Diagnosis not present

## 2019-07-09 DIAGNOSIS — Y92012 Bathroom of single-family (private) house as the place of occurrence of the external cause: Secondary | ICD-10-CM | POA: Diagnosis not present

## 2019-07-09 DIAGNOSIS — I4891 Unspecified atrial fibrillation: Secondary | ICD-10-CM | POA: Insufficient documentation

## 2019-07-09 DIAGNOSIS — S51011A Laceration without foreign body of right elbow, initial encounter: Secondary | ICD-10-CM | POA: Diagnosis not present

## 2019-07-09 DIAGNOSIS — J449 Chronic obstructive pulmonary disease, unspecified: Secondary | ICD-10-CM | POA: Diagnosis not present

## 2019-07-09 DIAGNOSIS — I10 Essential (primary) hypertension: Secondary | ICD-10-CM | POA: Diagnosis not present

## 2019-07-09 DIAGNOSIS — Z87891 Personal history of nicotine dependence: Secondary | ICD-10-CM | POA: Insufficient documentation

## 2019-07-09 NOTE — Progress Notes (Addendum)
[name], [name] ([id number]) Visit Report for 07/09/2019 Chief Complaint Document Details Patient Name: [name], [name]. Date of Service: 07/09/2019 12:45 PM Medical Record Number: [id number] Patient Account Number: [id number] Date of Birth/Sex: May 29, 1928 (85 y.o. F) Treating RN: [name] Primary Care Provider: Park Liter Other Clinician: Referring Provider: Park Liter Treating Provider/Extender: [name], [name] Weeks in Treatment: 17 Information Obtained from: Patient Chief Complaint Right elbow and forearm skin tears Electronic Signature(s) Signed: 07/09/2019 12:48:40 PM By: Worthy Keeler PA-C Entered By: Worthy Keeler on 07/09/2019 12:48:40 [name], [name] ([id number]) -------------------------------------------------------------------------------- HPI Details Patient Name: [name]. Date of Service: 07/09/2019 12:45 PM Medical Record Number: [id number] Patient Account Number: [id number] Date of Birth/Sex: [birth year] (85 y.o. F) Treating RN: [name] Primary Care Provider: Park Liter Other Clinician: Referring Provider: Park Liter Treating Provider/Extender: [name], [name] Weeks in Treatment: 17 History of Present Illness HPI Description: 02/12/18 ADMISSION This is an 85 year old woman who is recently moved to [place] from [place]. Her story began in February where she fell on some ice suffering extensive lacerations to her bilateral lower extremities. She is able to show me extensive pictures of the right lower leg but with going to a wound care center and [place] 3 times a week these eventually closed over. She has been left with 1 area on the upper lateral left calf. She has been applying Neosporin to this and applying a bandage. Currently this measures 2 x 1.5 cm. The patient is not a diabetic. She is an ex-smoker quitting 40 years ago. She does have COPD by description. ABIs in our clinic were 1.32 on the right and 1.03  on the left 02/19/18; right lower leg wound which was initially trauma. The area that we look that last week is smaller. Still covered in a nonviable surface however. We are using Iodoflex 02/26/18; right lower leg wound which was initially trauma in the setting of chronic venous insufficiency. Surface of the wound looks much better healthy granulation advancing epithelialization. We have good edema control 03/05/18; right lower leg wound which was initially trauma in the setting of chronic venous insufficiency. She continues to make nice progress here. We have good edema control oHe arrives today with a new traumatic laceration on her right dorsal forearm which she states happened while she was putting on a sweatshirt 03/12/18; right lower leg wound as closed however it still looks vulnerable. She has chronic venous insufficiency. oThe new wound from last week a traumatic area on her right dorsal or arm unfortunately does not have a viable surface. I remove some nonviable skin and necrotic subcutaneous debris from the wound surface. Hemostasis with silver nitrate and direct pressure 03/19/18; right lower leg wound is closed. She has chronic venous insufficiency and will need ongoing compression oThe new wounds from 2 weeks ago on the right upper elbow is closed. The area on her right dorsal forearm continues to have a nonviable surface requiring debridement 03/26/18; we'll need to look into ongoing compression for her legs. 2 wounds on the right upper elbow is closed. The area on the right dorsal forearm looks better. Hydrofera Blue secondary to hypertrophic granulation 04/02/18; he has compression stockings although she did not wear them today. Severe chronic venous insufficiency. She has 2 wounds on the right upper elbow which I said were closed last week which actually are although they're very small and she has a smaller clean wound on the right dorsal forearm. We've been using Hydrofera Blue  secondary  to some upper granulation. All of this looks better 04/09/18-She is seen in follow up evaluation for a right elbow and right forearm skin tear. The right elbow is healed. We will continue with same treatment plan and she'll follow next week 04/16/18-She is seen in follow-up evaluation for right forearm skin tear, this is essentially healed. We will cover with foam border and she will follow-up next week 04/23/18; the patient arrives with a new skin tear on her right dorsal arm just below the elbow. She got this while carrying a box. She has a linear skin tear. There is no depth I don't think this should've been sutured. There is a skin flap medially I'm not sure if this will be viable or not 04/30/18; the new skin tear from last week unfortunately has remained viable in terms of the skin flap. She has a small open area that looks healthy. Using moistened silver collagen 05/07/18; right dorsal arm skin tear. Nonviable tissue over the remnants of the wound. We've been using silver collagen 05/21/18; right dorsal arm skin tear. Nonviable tissue over a small remnant of the wound was washed off. We've been using silver collagen which we will continue 06/04/18; the right dorsal arm skin tear has healed. She arrives today with a traumatic wound just above the olecranon of her left elbow. This is a skin tear with a nonviable nonadherent flap which was removed. We'll use silver collagen here. Also noteworthy she arrives without her compression stockings 06/18/18; the area just above the olecranon of her left elbow. It is smaller and generally has a healthy surface. I was surprised [name], [name]. ([id number]) to learn that the patient is actually changing this herself although. Appears this week she was putting some topical lidocaine on this that she bought over-the-counter at the drugstore. She did get supplies at home she didn't know how to put it on. She drove herself here today i.e. not eligible  for home health Readmission: 11/04/18 on evaluation today patient presents for evaluation our clinic news to include the she has on her lower extremity which occurred as the result of having hit this on a piece of furniture. This was back in December. She states that her daughter has been trying to get her to come into have this evaluated here the wound care center but she was being somewhat stubborn. Nonetheless upon evaluation today the wound really appears to be potentially healed she does have some swelling of lower extremity which I think will come into play. With that being said he tells me that even yesterday this was still making clear fluid and therefore there may be some issues with continued problems with the swelling and drainage secondary to her venous stasis. Nonetheless overall wound appears to be doing fairly well which is good news. 11/11/18 on evaluation today patient actually appears to be doing excellent in the area of question that was green just the day before he saw her last time actually appears to have not drain that always in past week. I think this is done very well and at this point though she's had some discomfort I do not see any evidence of open wound which he needs to continue see wound care. Readmission: 03/10/19 on evaluation today patient presents for reevaluation here in our clinic concerning issues that she actually is having with the new problem on her right lower leg due to trauma that occurred when a car door shut on her leg last Thursday. This is not been  quite a week and she does have several skin tears unfortunately that resulted from this injury. Fortunately there's no signs of active infection at this time. No fevers, chills, nausea, or vomiting noted at this time. She does have some hot tissue noted at this point that has me concerned about the possibility of needing to move this in order to allow the areas to heal appropriately. Fortunately there's no  signs of active infection at this time. 03/17/19-Patient returns to clinic at 1 week, we have been using Prisma on the larger wounds with Kerlix and Xeroform. As many as 5 wounds were number last time 3 of which have closed, leaving the right proximal tibial and right distal medial leg wound both of which are looking good 03/24/19 on evaluation today patient appears to be doing better in regard to her wounds at this time. Fortunately there is no sign of active infection at this time. No fevers chills noted. She's been tolerating the dressing changes without complication which is good news she's been performing these herself it sounds like home health is not going to be coming out it sounds as if they feel like she may be able to take care of yourself and not be technically homebound. Nonetheless though that is unfortunate it does seem like she's done fairly well with taking care of yourself from the standpoint of the dressings. 03/31/19 on evaluation today patient actually appears to be doing well with regard to her lower extremity ulcers. She had one area to heal today and in regard to her right lower extremity were she continues to have an open wound I did have to perform some sharp debridement today. Subsequently the patient still has an area of what appears to be possibly a small hematoma on the anterior portion of her left lower extremity. This is not open enough at this time but nonetheless does seem to show some evidence of hardening which is not necessarily a bad thing. Eventually this may pop off but again I'm not interested in really removing this prematurely. 04/07/19 on evaluation today patient actually appears to be doing well with regard to her lower extremity ulcers. She is been tolerating the dressing changes without complication. She seems to be making excellent progress which is great news. She does have one small new skin tear area but the good news is she was able to pull the skin  back over and it really looks like it is healing quite nicely. Overall that is just a very small region and again seems to be healing nicely. 04/14/19 upon evaluation today patient actually appears to be doing quite well with regard to the wounds on the right lower extremity. In fact everything appears to be healed as of today. This is excellent news. She does however have the area on the left lower extremity which is showing some signs of loosening up we have been watching this area it is no longer fluctuant and in fact with the Betadine on it it is been slowly dry now but is now lifting up on the edge. There may be a wound underneath this region. 04/21/2019 on evaluation today patient appears to be doing excellent in regard to her bilateral lower extremities. In fact she has been tolerating the dressing changes without complication does not seem to show any signs of infection at this point. In fact based on what I am seeing currently I think she is completely healed. [name], [name] ([id number]) 05/04/2019 on evaluation today patient actually appears  to be doing quite well with regard to her right elbow. She has been tolerating the dressing changes without complication. Fortunately there is no signs of active infection at this time. Overall I am very pleased with how things are progressing. The elbow almost appears to be completely healed though it is not 100% today. 05/14/2019 on evaluation today patient actually appears to be doing well with regard to her elbow skin tear that we have been treating in fact this appears to be completely healed. Unfortunately she has a new skin tear noted as of today which actually occurred in the last week since I previously saw her. This again is unfortunate that it has been so long because I am not going to be able to actually pull the skin over where you have to trim this away and let it heal by second intent. This new wound is in the right elbow region as  well but again separate from the original ulcer. 05/19/2019 on evaluation today patient appears to be doing well with regard to her skin tear on her elbow. She has been tolerating the dressing changes without complication. Fortunately there is no signs of active infection at this time. No fever chills noted. Overall I am very pleased with how things appear today. She is not having any significant pain which is also excellent news. 05/26/2019 on evaluation today patient actually appears to be doing well with regard to her right elbow ulcer. She has been tolerating the dressing changes without complication. Fortunately there is no signs of active infection at this time. No fevers, chills, nausea, vomiting, or diarrhea. 06/02/2019 on evaluation today patient appears to be doing very well with regard to her wound of the right elbow. In fact there is just a very small opening still remaining for the most part this is completely closed and appears to be doing excellent. There is no signs of active infection at this time which is also good news. No fevers, chills, nausea, vomiting, or diarrhea. 06/09/2019 on evaluation today patient appears to actually be doing excellent with regard to her original wound on her elbow which really appears to be completely healed at this point. With that being said she is having issues with an area generally of weeping in this region which I am unsure exactly what is causing this. Does not appear to be obviously bacterially infected although that could be part of the issue. With that being said I do believe that there is also a chance there could be a fungal infection here. 06/18/2019 on evaluation today patient's elbow ulcer appears to be doing much better I do believe this was more likely a fungal infection that was going on at the last visit and fortunately she seems to be doing much better at this point. With that being said this is not completely healed but I am hopeful  will be so by next week. She is very pleased that is really not having any pain in regard to the elbow. 06/25/2019 upon evaluation today patient appears to be doing well with regard to her elbow ulcer that we have been taking care of unfortunately she has a new skin tear on her forearm which has arisen since I last saw her. This she states resulted from a fall that she had tried to get out of her bathtub on Tuesday. Obviously this is not good news she continues to have significant falls which I am very concerned about to be perfectly honest. I explained to her  that if she is falling she runs the risk of injuring her head or potentially breaking something that could be more significant which she states she understands but she just "tries not to think about it". I explained to her that I feel like this is something that she absolutely needs to think about in order to protect herself.-Suggested she may want to consider an assisted living facility she is in an apartment complex where she does have friends she really does not want to move out I therefore told her that at minimum I would recommend that she get in touch with her daughter and attempt if at all possible to have her contact her each day which she states they typically do anyway and if she is unable to get in touch with her that she subsequently call someone to check on her mother and again if they are not able to then have a way to be able to get into her apartment to check on her in case something untoward happens. 07/09/2019 on evaluation today patient actually appears to be doing excellent. She has been tolerating the dressing changes and her wound appears to be completely healed which is great news she has no new skin tears at this point. Electronic Signature(s) Signed: 07/09/2019 1:10:24 PM By: Worthy Keeler PA-C Entered By: Worthy Keeler on 07/09/2019 13:10:24 [name], [name] -------------------------------------------------------------------------------- Physical Exam Details Patient Name: [name], [name] Date of Service: 07/09/2019 12:45 PM Medical Record Number: [id number] Patient Account Number: [id number] Date of Birth/Sex: 1927-09-25 (85 y.o. F) Treating RN: [name] Primary Care Provider: Park Liter Other Clinician: Referring Provider: Park Liter Treating Provider/Extender: [name], [name] Weeks in Treatment: 73 Constitutional Well-nourished and well-hydrated in no acute distress. Respiratory normal breathing without difficulty. Psychiatric this patient is able to make decisions and demonstrates good insight into disease process. Alert and Oriented x 3. pleasant and cooperative. Notes Patient's wound appears to be completely epithelialized but that the elbow on the right as well as the forearm. I am very pleased with how things have gone and I do not see any reason that we need to have this area continually covered at this point since she is doing so well. Electronic Signature(s) Signed: 07/09/2019 1:11:01 PM By: Worthy Keeler PA-C Entered By: Worthy Keeler on 07/09/2019 13:11:00 MAKINA, SHOOTS ([id number]) -------------------------------------------------------------------------------- Physician Orders Details Patient Name: [name]. Date of Service: 07/09/2019 12:45 PM Medical Record Number: [id number] Patient Account Number: [id number] Date of Birth/Sex: [birth year] (85 y.o. F) Treating RN: [name] Primary Care Provider: Park Liter Other Clinician: Referring Provider: Park Liter Treating Provider/Extender: [name], [name] Weeks in Treatment: 96 Verbal / Phone Orders: No Diagnosis Coding ICD-10 Coding Code Description I87.2 Venous insufficiency (chronic) (peripheral) S51.001A Unspecified open wound of right elbow, initial encounter S51.801A Unspecified open wound of right forearm,  initial encounter Discharge From [place] o Discharge from [place] complete Electronic Signature(s) Signed: 07/09/2019 4:39:17 PM By: [name] Signed: 07/09/2019 6:06:55 PM By: Worthy Keeler PA-C Entered By: [name] on 07/09/2019 13:09:00 Feuerstein, Annalisia H. ([id number]) -------------------------------------------------------------------------------- Problem List Details Patient Name: Fogarty, Dempsey H. Date of Service: 07/09/2019 12:45 PM Medical Record Number: [id number] Patient Account Number: [id number] Date of Birth/Sex: Dec 07, 1927 (85 y.o. F) Treating RN: [name] Primary Care Provider: Park Liter Other Clinician: Referring Provider: Park Liter Treating Provider/Extender: [name], [name] Weeks in Treatment: 62 Active Problems ICD-10 Evaluated Encounter Code Description Active Date Today  Diagnosis I87.2 Venous insufficiency (chronic) (peripheral) 03/10/2019 No Yes S51.001A Unspecified open wound of right elbow, initial encounter 04/28/2019 No Yes S51.801A Unspecified open wound of right forearm, initial encounter 06/25/2019 No Yes Inactive Problems Resolved Problems ICD-10 Code Description Active Date Resolved Date L97.812 Non-pressure chronic ulcer of other part of right lower leg with fat 03/10/2019 03/10/2019 layer exposed Electronic Signature(s) Signed: 07/09/2019 12:48:34 PM By: Worthy Keeler PA-C Entered By: Worthy Keeler on 07/09/2019 12:48:34 Larmer, Cherolyn Lemmie Evens ([id number]) -------------------------------------------------------------------------------- Progress Note Details Patient Name: Renshaw, Aasia H. Date of Service: 07/09/2019 12:45 PM Medical Record Number: [id number] Patient Account Number: [id number] Date of Birth/Sex: [birth year] (85 y.o. F) Treating RN: [name] Primary Care Provider: Park Liter Other Clinician: Referring Provider: Park Liter Treating Provider/Extender: [name],  [name] Weeks in Treatment: 17 Subjective Chief Complaint Information obtained from Patient Right elbow and forearm skin tears History of Present Illness (HPI) 02/12/18 ADMISSION This is an 85 year old woman who is recently moved to [place] from [place]. Her story began in February where she fell on some ice suffering extensive lacerations to her bilateral lower extremities. She is able to show me extensive pictures of the right lower leg but with going to a wound care center and [place] 3 times a week these eventually closed over. She has been left with 1 area on the upper lateral left calf. She has been applying Neosporin to this and applying a bandage. Currently this measures 2 x 1.5 cm. The patient is not a diabetic. She is an ex-smoker quitting 40 years ago. She does have COPD by description. ABIs in our clinic were 1.32 on the right and 1.03 on the left 02/19/18; right lower leg wound which was initially trauma. The area that we look that last week is smaller. Still covered in a nonviable surface however. We are using Iodoflex 02/26/18; right lower leg wound which was initially trauma in the setting of chronic venous insufficiency. Surface of the wound looks much better healthy granulation advancing epithelialization. We have good edema control 03/05/18; right lower leg wound which was initially trauma in the setting of chronic venous insufficiency. She continues to make nice progress here. We have good edema control He arrives today with a new traumatic laceration on her right dorsal forearm which she states happened while she was putting on a sweatshirt 03/12/18; right lower leg wound as closed however it still looks vulnerable. She has chronic venous insufficiency. The new wound from last week a traumatic area on her right dorsal or arm unfortunately does not have a viable surface. I remove some nonviable skin and necrotic subcutaneous debris from the wound surface. Hemostasis with  silver nitrate and direct pressure 03/19/18; right lower leg wound is closed. She has chronic venous insufficiency and will need ongoing compression The new wounds from 2 weeks ago on the right upper elbow is closed. The area on her right dorsal forearm continues to have a nonviable surface requiring debridement 03/26/18; we'll need to look into ongoing compression for her legs. 2 wounds on the right upper elbow is closed. The area on the right dorsal forearm looks better. Hydrofera Blue secondary to hypertrophic granulation 04/02/18; he has compression stockings although she did not wear them today. Severe chronic venous insufficiency. She has 2 wounds on the right upper elbow which I said were closed last week which actually are although they're very small and she has a smaller clean wound on the right dorsal forearm. We've been using Hydrofera Blue secondary  to some upper granulation. All of this looks better 04/09/18-She is seen in follow up evaluation for a right elbow and right forearm skin tear. The right elbow is healed. We will continue with same treatment plan and she'll follow next week 04/16/18-She is seen in follow-up evaluation for right forearm skin tear, this is essentially healed. We will cover with foam border and she will follow-up next week 04/23/18; the patient arrives with a new skin tear on her right dorsal arm just below the elbow. She got this while carrying a box. She has a linear skin tear. There is no depth I don't think this should've been sutured. There is a skin flap medially I'm not sure if this will be viable or not 04/30/18; the new skin tear from last week unfortunately has remained viable in terms of the skin flap. She has a small open area that looks healthy. Using moistened silver collagen 05/07/18; right dorsal arm skin tear. Nonviable tissue over the remnants of the wound. We've been using silver collagen [name], [name] ([id number]) 05/21/18; right dorsal arm  skin tear. Nonviable tissue over a small remnant of the wound was washed off. We've been using silver collagen which we will continue 06/04/18; the right dorsal arm skin tear has healed. She arrives today with a traumatic wound just above the olecranon of her left elbow. This is a skin tear with a nonviable nonadherent flap which was removed. We'll use silver collagen here. Also noteworthy she arrives without her compression stockings 06/18/18; the area just above the olecranon of her left elbow. It is smaller and generally has a healthy surface. I was surprised to learn that the patient is actually changing this herself although. Appears this week she was putting some topical lidocaine on this that she bought over-the-counter at the drugstore. She did get supplies at home she didn't know how to put it on. She drove herself here today i.e. not eligible for home health Readmission: 11/04/18 on evaluation today patient presents for evaluation our clinic news to include the she has on her lower extremity which occurred as the result of having hit this on a piece of furniture. This was back in December. She states that her daughter has been trying to get her to come into have this evaluated here the wound care center but she was being somewhat stubborn. Nonetheless upon evaluation today the wound really appears to be potentially healed she does have some swelling of lower extremity which I think will come into play. With that being said he tells me that even yesterday this was still making clear fluid and therefore there may be some issues with continued problems with the swelling and drainage secondary to her venous stasis. Nonetheless overall wound appears to be doing fairly well which is good news. 11/11/18 on evaluation today patient actually appears to be doing excellent in the area of question that was green just the day before he saw her last time actually appears to have not drain that always in  past week. I think this is done very well and at this point though she's had some discomfort I do not see any evidence of open wound which he needs to continue see wound care. Readmission: 03/10/19 on evaluation today patient presents for reevaluation here in our clinic concerning issues that she actually is having with the new problem on her right lower leg due to trauma that occurred when a car door shut on her leg last Thursday. This is not  been quite a week and she does have several skin tears unfortunately that resulted from this injury. Fortunately there's no signs of active infection at this time. No fevers, chills, nausea, or vomiting noted at this time. She does have some hot tissue noted at this point that has me concerned about the possibility of needing to move this in order to allow the areas to heal appropriately. Fortunately there's no signs of active infection at this time. 03/17/19-Patient returns to clinic at 1 week, we have been using Prisma on the larger wounds with Kerlix and Xeroform. As many as 5 wounds were number last time 3 of which have closed, leaving the right proximal tibial and right distal medial leg wound both of which are looking good 03/24/19 on evaluation today patient appears to be doing better in regard to her wounds at this time. Fortunately there is no sign of active infection at this time. No fevers chills noted. She's been tolerating the dressing changes without complication which is good news she's been performing these herself it sounds like home health is not going to be coming out it sounds as if they feel like she may be able to take care of yourself and not be technically homebound. Nonetheless though that is unfortunate it does seem like she's done fairly well with taking care of yourself from the standpoint of the dressings. 03/31/19 on evaluation today patient actually appears to be doing well with regard to her lower extremity ulcers. She had one area  to heal today and in regard to her right lower extremity were she continues to have an open wound I did have to perform some sharp debridement today. Subsequently the patient still has an area of what appears to be possibly a small hematoma on the anterior portion of her left lower extremity. This is not open enough at this time but nonetheless does seem to show some evidence of hardening which is not necessarily a bad thing. Eventually this may pop off but again I'm not interested in really removing this prematurely. 04/07/19 on evaluation today patient actually appears to be doing well with regard to her lower extremity ulcers. She is been tolerating the dressing changes without complication. She seems to be making excellent progress which is great news. She does have one small new skin tear area but the good news is she was able to pull the skin back over and it really looks like it is healing quite nicely. Overall that is just a very small region and again seems to be healing nicely. 04/14/19 upon evaluation today patient actually appears to be doing quite well with regard to the wounds on the right lower extremity. In fact everything appears to be healed as of today. This is excellent news. She does however have the area on the left lower extremity which is showing some signs of loosening up we have been watching this area it is no longer fluctuant and in fact with the Betadine on it it is been slowly dry now but is now lifting up on the edge. There may be a wound underneath [name], [name] this region. 04/21/2019 on evaluation today patient appears to be doing excellent in regard to her bilateral lower extremities. In fact she has been tolerating the dressing changes without complication does not seem to show any signs of infection at this point. In fact based on what I am seeing currently I think she is completely healed. 05/04/2019 on evaluation today patient actually appears  to  be doing quite well with regard to her right elbow. She has been tolerating the dressing changes without complication. Fortunately there is no signs of active infection at this time. Overall I am very pleased with how things are progressing. The elbow almost appears to be completely healed though it is not 100% today. 05/14/2019 on evaluation today patient actually appears to be doing well with regard to her elbow skin tear that we have been treating in fact this appears to be completely healed. Unfortunately she has a new skin tear noted as of today which actually occurred in the last week since I previously saw her. This again is unfortunate that it has been so long because I am not going to be able to actually pull the skin over where you have to trim this away and let it heal by second intent. This new wound is in the right elbow region as well but again separate from the original ulcer. 05/19/2019 on evaluation today patient appears to be doing well with regard to her skin tear on her elbow. She has been tolerating the dressing changes without complication. Fortunately there is no signs of active infection at this time. No fever chills noted. Overall I am very pleased with how things appear today. She is not having any significant pain which is also excellent news. 05/26/2019 on evaluation today patient actually appears to be doing well with regard to her right elbow ulcer. She has been tolerating the dressing changes without complication. Fortunately there is no signs of active infection at this time. No fevers, chills, nausea, vomiting, or diarrhea. 06/02/2019 on evaluation today patient appears to be doing very well with regard to her wound of the right elbow. In fact there is just a very small opening still remaining for the most part this is completely closed and appears to be doing excellent. There is no signs of active infection at this time which is also good news. No fevers, chills,  nausea, vomiting, or diarrhea. 06/09/2019 on evaluation today patient appears to actually be doing excellent with regard to her original wound on her elbow which really appears to be completely healed at this point. With that being said she is having issues with an area generally of weeping in this region which I am unsure exactly what is causing this. Does not appear to be obviously bacterially infected although that could be part of the issue. With that being said I do believe that there is also a chance there could be a fungal infection here. 06/18/2019 on evaluation today patient's elbow ulcer appears to be doing much better I do believe this was more likely a fungal infection that was going on at the last visit and fortunately she seems to be doing much better at this point. With that being said this is not completely healed but I am hopeful will be so by next week. She is very pleased that is really not having any pain in regard to the elbow. 06/25/2019 upon evaluation today patient appears to be doing well with regard to her elbow ulcer that we have been taking care of unfortunately she has a new skin tear on her forearm which has arisen since I last saw her. This she states resulted from a fall that she had tried to get out of her bathtub on Tuesday. Obviously this is not good news she continues to have significant falls which I am very concerned about to be perfectly honest. I explained to her  that if she is falling she runs the risk of injuring her head or potentially breaking something that could be more significant which she states she understands but she just "tries not to think about it". I explained to her that I feel like this is something that she absolutely needs to think about in order to protect herself.-Suggested she may want to consider an assisted living facility she is in an apartment complex where she does have friends she really does not want to move out I therefore told her  that at minimum I would recommend that she get in touch with her daughter and attempt if at all possible to have her contact her each day which she states they typically do anyway and if she is unable to get in touch with her that she subsequently call someone to check on her mother and again if they are not able to then have a way to be able to get into her apartment to check on her in case something untoward happens. 07/09/2019 on evaluation today patient actually appears to be doing excellent. She has been tolerating the dressing changes and her wound appears to be completely healed which is great news she has no new skin tears at this point. Patient History Information obtained from Patient. [name], [name] Family History Cancer - Siblings,Paternal Grandparents, Diabetes - Siblings, Heart Disease - Mother,Father,Child,Paternal Grandparents, Hypertension - Child, No family history of Hereditary Spherocytosis, Kidney Disease, Lung Disease, Seizures, Stroke, Thyroid Problems, Tuberculosis. Social History Former smoker - quit 40 yrs ago, Marital Status - Divorced, Alcohol Use - Never, Drug Use - No History, Caffeine Use - Rarely. Medical History Eyes Patient has history of Cataracts - surgery 2000, Glaucoma Respiratory Patient has history of Asthma, Chronic Obstructive Pulmonary Disease (COPD) Denies history of Pneumothorax, Sleep Apnea, Tuberculosis Cardiovascular Patient has history of Arrhythmia - a-fib, Hypertension Denies history of Angina, Congestive Heart Failure, Coronary Artery Disease, Deep Vein Thrombosis, Hypotension, Myocardial Infarction, Peripheral Arterial Disease, Peripheral Venous Disease, Phlebitis, Vasculitis Endocrine Denies history of Type I Diabetes, Type II Diabetes Genitourinary Denies history of End Stage Renal Disease Immunological Denies history of Lupus Erythematosus, Raynaud s, Scleroderma Integumentary (Skin) Denies history of History  of Burn, History of pressure wounds Musculoskeletal Denies history of Gout, Rheumatoid Arthritis, Osteoarthritis, Osteomyelitis Neurologic Denies history of Dementia, Neuropathy, Quadriplegia, Paraplegia, Seizure Disorder Oncologic Denies history of Received Chemotherapy, Received Radiation Psychiatric Denies history of Anorexia/bulimia, Confinement Anxiety Hospitalization/Surgery History - Colon cancer. Review of Systems (ROS) Constitutional Symptoms (General Health) Denies complaints or symptoms of Fatigue, Fever, Chills, Marked Weight Change. Respiratory Denies complaints or symptoms of Chronic or frequent coughs, Shortness of Breath. Psychiatric Denies complaints or symptoms of Anxiety, Claustrophobia. Objective Constitutional [name], [name] ([id number]) Well-nourished and well-hydrated in no acute distress. Vitals Time Taken: 12:50 PM, Height: 60 in, Weight: 92 lbs, BMI: 18, Temperature: 98.7 F, Pulse: 61 bpm, Respiratory Rate: 16 breaths/min, Blood Pressure: 146/54 mmHg. Respiratory normal breathing without difficulty. Psychiatric this patient is able to make decisions and demonstrates good insight into disease process. Alert and Oriented x 3. pleasant and cooperative. General Notes: Patient's wound appears to be completely epithelialized but that the elbow on the right as well as the forearm. I am very pleased with how things have gone and I do not see any reason that we need to have this area continually covered at this point since she is doing so well. Integumentary (Hair, Skin) Wound #16 status is Healed - Epithelialized. Original cause  of wound was Trauma. The wound is located on the Right Elbow. The wound measures 0cm length x 0cm width x 0cm depth; 0cm^2 area and 0cm^3 volume. Wound #17 status is Healed - Epithelialized. Original cause of wound was Trauma. The wound is located on the Right,Anterior Forearm. The wound measures 0cm length x 0cm width x 0cm depth;  0cm^2 area and 0cm^3 volume. Assessment Active Problems ICD-10 Venous insufficiency (chronic) (peripheral) Unspecified open wound of right elbow, initial encounter Unspecified open wound of right forearm, initial encounter Plan Discharge From [facility] Services: Discharge from [facility] complete 1. I would recommend that we discontinue wound care services as the patient is healed. 2. I do recommend that she be very careful I suggested that if she has any areas that are somewhat sharp that she could bump into her skin on that she avoid those regions by possibly putting some foam over the edges of the corner to prevent that from catching her arm and causing skin tears. She has had numerous ones since she has been under our care here at the wound care center. She voiced understanding. We will see her back as needed if anything changes or worsens. [name], [name] ([id number]) Electronic Signature(s) Signed: 07/09/2019 1:11:22 PM By: Worthy Keeler PA-C Entered By: Worthy Keeler on 07/09/2019 13:11:22 [name], [name] ([id number]) -------------------------------------------------------------------------------- ROS/PFSH Details Patient Name: [name]. Date of Service: 07/09/2019 12:45 PM Medical Record Number: [id number] Patient Account Number: [id number] Date of Birth/Sex: 1928/08/09 (85 y.o. F) Treating RN: [name] Primary Care Provider: Park Liter Other Clinician: Referring Provider: Park Liter Treating Provider/Extender: [name], [name] Weeks in Treatment: 17 Information Obtained From Patient Constitutional Symptoms (General Health) Complaints and Symptoms: Negative for: Fatigue; Fever; Chills; Marked Weight Change Respiratory Complaints and Symptoms: Negative for: Chronic or frequent coughs; Shortness of Breath Medical History: Positive for: Asthma; Chronic Obstructive Pulmonary Disease (COPD) Negative for: Pneumothorax; Sleep  Apnea; Tuberculosis Psychiatric Complaints and Symptoms: Negative for: Anxiety; Claustrophobia Medical History: Negative for: Anorexia/bulimia; Confinement Anxiety Eyes Medical History: Positive for: Cataracts - surgery 2000; Glaucoma Cardiovascular Medical History: Positive for: Arrhythmia - a-fib; Hypertension Negative for: Angina; Congestive Heart Failure; Coronary Artery Disease; Deep Vein Thrombosis; Hypotension; Myocardial Infarction; Peripheral Arterial Disease; Peripheral Venous Disease; Phlebitis; Vasculitis Endocrine Medical History: Negative for: Type I Diabetes; Type II Diabetes Genitourinary Medical History: Negative for: End Stage Renal Disease Immunological [name], [name]. ([id number]) Medical History: Negative for: Lupus Erythematosus; Raynaudos; Scleroderma Integumentary (Skin) Medical History: Negative for: History of Burn; History of pressure wounds Musculoskeletal Medical History: Negative for: Gout; Rheumatoid Arthritis; Osteoarthritis; Osteomyelitis Neurologic Medical History: Negative for: Dementia; Neuropathy; Quadriplegia; Paraplegia; Seizure Disorder Oncologic Medical History: Negative for: Received Chemotherapy; Received Radiation HBO Extended History Items Eyes: Eyes: Cataracts Glaucoma Immunizations Pneumococcal Vaccine: Received Pneumococcal Vaccination: Yes Implantable Devices None Hospitalization / Surgery History Type of Hospitalization/Surgery Colon cancer Family and Social History Cancer: Yes - Siblings,Paternal Grandparents; Diabetes: Yes - Siblings; Heart Disease: Yes - Mother,Father,Child,Paternal Grandparents; Hereditary Spherocytosis: No; Hypertension: Yes - Child; Kidney Disease: No; Lung Disease: No; Seizures: No; Stroke: No; Thyroid Problems: No; Tuberculosis: No; Former smoker - quit 40 yrs ago; Marital Status - Divorced; Alcohol Use: Never; Drug Use: No History; Caffeine Use: Rarely; Financial Concerns: No; Food,  Clothing or Shelter Needs: No; Support System Lacking: No; Transportation Concerns: No Physician Affirmation I have reviewed and agree with the above information. Electronic Signature(s) Signed: 07/09/2019 4:39:17 PM By: [name] Signed: 07/09/2019 6:06:55 PM By:  [name], [name] PA-C Entered By: Worthy Keeler on 07/09/2019 13:10:38 Kellman, [name] ([id number]) -------------------------------------------------------------------------------- SuperBill Details Patient Name: PEYTON, KEEFAUVER. Date of Service: 07/09/2019 Medical Record Number: [id number] Patient Account Number: [id number] Date of Birth/Sex: [birth year] (85 y.o. F) Treating RN: [name] Primary Care Provider: Park Liter Other Clinician: Referring Provider: Park Liter Treating Provider/Extender: [name], [name] Weeks in Treatment: 17 Diagnosis Coding ICD-10 Codes Code Description I87.2 Venous insufficiency (chronic) (peripheral) S51.001A Unspecified open wound of right elbow, initial encounter S51.801A Unspecified open wound of right forearm, initial encounter Facility Procedures CPT4 Code: [id number] Description: [place] VISIT-LEV 3 EST PT Modifier: Quantity: 1 Physician Procedures CPT4 Code: [id number] Description: [id number] - [facility] PHYS LEVEL 3 - EST PT ICD-10 Diagnosis Description I87.2 Venous insufficiency (chronic) (peripheral) S51.001A Unspecified open wound of right elbow, initial encounter S51.801A Unspecified open wound of right forearm, initial  encounte Modifier: r Quantity: 1 Electronic Signature(s) Signed: 07/09/2019 1:11:45 PM By: Worthy Keeler PA-C Entered By: Worthy Keeler on 07/09/2019 13:11:45

## 2019-07-09 NOTE — Progress Notes (Addendum)
WYOMA, CROUSHORE ([id number]) Visit Report for 07/09/2019 Arrival Information Details Patient Name: [name], [name]. Date of Service: 07/09/2019 12:45 PM Medical Record Number: [id number] Patient Account Number: [id number] Date of Birth/Sex: [birth year] (85 y.o. F) Treating RN: [name] Primary Care Watson Robarge: Park Liter Other Clinician: Referring Oprah Camarena: Park Liter Treating Raven Harmes/Extender: [name], [name] Weeks in Treatment: 60 Visit Information History Since Last Visit Added or deleted any medications: No Patient Arrived: Ambulatory Any new allergies or adverse reactions: No Arrival Time: 12:51 Had a fall or experienced change in No Accompanied By: self activities of daily living that may affect Transfer Assistance: None risk of falls: Patient Identification Verified: Yes Signs or symptoms of abuse/neglect since last visito No Secondary Verification Process Yes Hospitalized since last visit: No Completed: Implantable device outside of the clinic excluding No Patient Requires Transmission-Based No cellular tissue based products placed in the center Precautions: since last visit: Patient Has Alerts: Yes Has Dressing in Place as Prescribed: Yes Patient Alerts: Patient on Blood Pain Present Now: No Thinner 81mg  aspirin Electronic Signature(s) Signed: 07/09/2019 4:57:32 PM By: Lorine Bears RCP, RRT, CHT Entered By: Lorine Bears on 07/09/2019 12:53:24 Stjames, Aaralynn Lemmie Wise ([id number]) -------------------------------------------------------------------------------- Clinic Level of Care Assessment Details Patient Name: [name], [name] Date of Service: 07/09/2019 12:45 PM Medical Record Number: [id number] Patient Account Number: [id number] Date of Birth/Sex: 17-Sep-1927 (85 y.o. F) Treating RN: [name] Primary Care Rober Skeels: Park Liter Other Clinician: Referring Aaliyah Cancro: Park Liter Treating Ataya Murdy/Extender:  [name], [name] Weeks in Treatment: 17 Clinic Level of Care Assessment Items TOOL 4 Quantity Score []  - Use when only an EandM is performed on FOLLOW-UP visit 0 ASSESSMENTS - Nursing Assessment / Reassessment X - Reassessment of Co-morbidities (includes updates in patient status) 1 10 X- 1 5 Reassessment of Adherence to Treatment Plan ASSESSMENTS - Wound and Skin Assessment / Reassessment X - Simple Wound Assessment / Reassessment - one wound 1 5 []  - 0 Complex Wound Assessment / Reassessment - multiple wounds []  - 0 Dermatologic / Skin Assessment (not related to wound area) ASSESSMENTS - Focused Assessment []  - Circumferential Edema Measurements - multi extremities 0 []  - 0 Nutritional Assessment / Counseling / Intervention []  - 0 Lower Extremity Assessment (monofilament, tuning fork, pulses) []  - 0 Peripheral Arterial Disease Assessment (using hand held doppler) ASSESSMENTS - Ostomy and/or Continence Assessment and Care []  - Incontinence Assessment and Management 0 []  - 0 Ostomy Care Assessment and Management (repouching, etc.) PROCESS - Coordination of Care X - Simple Patient / Family Education for ongoing care 1 15 []  - 0 Complex (extensive) Patient / Family Education for ongoing care X- 1 10 Staff obtains Programmer, systems, Records, Test Results / Process Orders []  - 0 Staff telephones HHA, Nursing Homes / Clarify orders / etc []  - 0 Routine Transfer to another Facility (non-emergent condition) []  - 0 Routine Hospital Admission (non-emergent condition) []  - 0 New Admissions / Biomedical engineer / Ordering NPWT, Apligraf, etc. []  - 0 Emergency Hospital Admission (emergent condition) X- 1 10 Simple Discharge Coordination [name], [name] ([id number]) []  - 0 Complex (extensive) Discharge Coordination PROCESS - Special Needs []  - Pediatric / Minor Patient Management 0 []  - 0 Isolation Patient Management []  - 0 Hearing / Language / Visual special needs X- 1  15 Assessment of Community assistance (transportation, D/C planning, etc.) []  - 0 Additional assistance / Altered mentation []  - 0 Support Surface(s) Assessment (bed, cushion, seat, etc.) INTERVENTIONS - Wound Cleansing / Measurement X -  Simple Wound Cleansing - one wound 1 5 []  - 0 Complex Wound Cleansing - multiple wounds X- 1 5 Wound Imaging (photographs - any number of wounds) []  - 0 Wound Tracing (instead of photographs) X- 1 5 Simple Wound Measurement - one wound []  - 0 Complex Wound Measurement - multiple wounds INTERVENTIONS - Wound Dressings []  - Small Wound Dressing one or multiple wounds 0 []  - 0 Medium Wound Dressing one or multiple wounds []  - 0 Large Wound Dressing one or multiple wounds []  - 0 Application of Medications - topical []  - 0 Application of Medications - injection INTERVENTIONS - Miscellaneous []  - External ear exam 0 []  - 0 Specimen Collection (cultures, biopsies, blood, body fluids, etc.) []  - 0 Specimen(s) / Culture(s) sent or taken to Lab for analysis []  - 0 Patient Transfer (multiple staff / Civil Service fast streamer / Similar devices) []  - 0 Simple Staple / Suture removal (25 or less) []  - 0 Complex Staple / Suture removal (26 or more) []  - 0 Hypo / Hyperglycemic Management (close monitor of Blood Glucose) []  - 0 Ankle / Brachial Index (ABI) - do not check if billed separately X- 1 5 Vital Signs [name], [name] ([id number]) Has the patient been seen at the hospital within the last three years: Yes Total Score: 90 Level Of Care: New/Established - Level 3 Electronic Signature(s) Signed: 07/09/2019 4:39:17 PM By: [name] Entered By: [name] on 07/09/2019 13:10:26 [name], [name] -------------------------------------------------------------------------------- Encounter Discharge Information Details Patient Name: Carol Berger H. Date of Service: 07/09/2019 12:45 PM Medical Record Number: [id number] Patient Account  Number: [id number] Date of Birth/Sex: 1928/04/09 (85 y.o. F) Treating RN: [name] Primary Care Pedro Whiters: Park Liter Other Clinician: Referring Alize Borrayo: Park Liter Treating Barbarann Kelly/Extender: [name], [name] Weeks in Treatment: 63 Encounter Discharge Information Items Discharge Condition: Stable Ambulatory Status: Ambulatory Discharge Destination: Home Transportation: Private Auto Accompanied By: self Schedule Follow-up Appointment: Yes Clinical Summary of Care: Electronic Signature(s) Signed: 07/09/2019 4:39:17 PM By: [name] Entered By: [name] on 07/09/2019 13:10:54 [name], [name] ([id number]) -------------------------------------------------------------------------------- Lower Extremity Assessment Details Patient Name: [name], [name] Date of Service: 07/09/2019 12:45 PM Medical Record Number: [id number] Patient Account Number: [id number] Date of Birth/Sex: 04-22-1928 (85 y.o. F) Treating RN: [name] Barman Primary Care Yeriel Mineo: Park Liter Other Clinician: Referring Khia Dieterich: Park Liter Treating Esma Kilts/Extender: Sharalyn Ink in Treatment: 17 Electronic Signature(s) Signed: 07/09/2019 5:19:38 PM By: Gretta Cool, BSN, RN, CWS, Kim RN, BSN Entered By: Gretta Cool, BSN, RN, CWS, Kim on 07/09/2019 12:55:51 [name], [name] -------------------------------------------------------------------------------- Multi Wound Chart Details Patient Name: Brailsford, Nicola H. Date of Service: 07/09/2019 12:45 PM Medical Record Number: [id number] Patient Account Number: [id number] Date of Birth/Sex: 17-May-1928 (85 y.o. F) Treating RN: [name] Primary Care Danijela Vessey: Park Liter Other Clinician: Referring Kyuss Hale: Park Liter Treating Brittaney Beaulieu/Extender: [name], [name] Weeks in Treatment: 17 Vital Signs Height(in): 60 Pulse(bpm): 61 Weight(lbs): 92 Blood Pressure(mmHg): 146/54 Body Mass Index(BMI): 18 Temperature(F):  98.7 Respiratory Rate 16 (breaths/min): Photos: [N/A:N/A] Wound Location: Right Elbow Right, Anterior Forearm N/A Wounding Event: Trauma Trauma N/A Primary Etiology: Skin Tear Trauma, Other N/A Date Acquired: 05/11/2019 06/23/2019 N/A Weeks of Treatment: 8 2 N/A Wound Status: Healed - Epithelialized Healed - Epithelialized N/A Measurements L x W x D 0x0x0 0x0x0 N/A (cm) Area (cm) : 0 0 N/A Volume (cm) : 0 0 N/A % Reduction in Area: 100.00% 100.00% N/A % Reduction in Volume: 100.00% 100.00% N/A Classification: Full Thickness Without Full Thickness Without  N/A Exposed Support Structures Exposed Support Structures Treatment Notes Electronic Signature(s) Signed: 07/09/2019 4:39:17 PM By: [name] Entered By: [name] on 07/09/2019 13:08:39 JAMILETTE, COLYAR ([id number]) -------------------------------------------------------------------------------- [place] Details Patient Name: Carol Berger H. Date of Service: 07/09/2019 12:45 PM Medical Record Number: [id number] Patient Account Number: [id number] Date of Birth/Sex: 11/12/1927 (85 y.o. F) Treating RN: [name] Primary Care Magdelena Kinsella: Park Liter Other Clinician: Referring Ameet Sandy: Park Liter Treating Jerrik Housholder/Extender: [name], [name] Weeks in Treatment: 48 Active Inactive Electronic Signature(s) Signed: 07/09/2019 4:39:17 PM By: [name] Entered By: [name] on 07/09/2019 13:08:28 Bieler, [name] ([id number]) -------------------------------------------------------------------------------- Pain Assessment Details Patient Name: [name], [name] Date of Service: 07/09/2019 12:45 PM Medical Record Number: [id number] Patient Account Number: [id number] Date of Birth/Sex: [birth year] (85 y.o. F) Treating RN: [name] Primary Care Axie Hayne: Park Liter Other Clinician: Referring Shikara Mcauliffe: Park Liter Treating Eulala Newcombe/Extender: [name], [name] Weeks in  Treatment: 71 Active Problems Location of Pain Severity and Description of Pain Patient Has Paino No Site Locations Pain Management and Medication Current Pain Management: Electronic Signature(s) Signed: 07/09/2019 4:39:17 PM By: [name] Signed: 07/09/2019 4:57:32 PM By: Lorine Bears RCP, RRT, CHT Entered By: Lorine Bears on 07/09/2019 12:53:34 [name], Tanajah Lemmie Wise ([id number]) -------------------------------------------------------------------------------- Patient/Caregiver Education Details Patient Name: Carol Games. Date of Service: 07/09/2019 12:45 PM Medical Record Number: [id number] Patient Account Number: [id number] Date of Birth/Gender: Jul 02, 1928 (85 y.o. F) Treating RN: [name] Primary Care Physician: Park Liter Other Clinician: Referring Physician: Park Liter Treating Physician/Extender: Sharalyn Ink in Treatment: 59 Education Assessment Education Provided To: Patient Education Topics Provided Wound/Skin Impairment: Handouts: Caring for Your Ulcer Methods: Demonstration, Explain/Verbal Responses: State content correctly Electronic Signature(s) Signed: 07/09/2019 4:39:17 PM By: [name] Entered By: [name] on 07/09/2019 13:10:41 [place], [place]. ([id number]) -------------------------------------------------------------------------------- Wound Assessment Details Patient Name: Schonberg, Allex H. Date of Service: 07/09/2019 12:45 PM Medical Record Number: [id number] Patient Account Number: [id number] Date of Birth/Sex: June 09, 1928 (85 y.o. F) Treating RN: [name] Barman Primary Care Donterrius Santucci: Park Liter Other Clinician: Referring Amyre Segundo: Park Liter Treating Danniell Rotundo/Extender: [name], [name] Weeks in Treatment: 17 Wound Status Wound Number: 16 Primary Etiology: Skin Tear Wound Location: Right Elbow Wound Status: Healed - Epithelialized Wounding Event: Trauma Date Acquired:  05/11/2019 Weeks Of Treatment: 8 Clustered Wound: No Photos Photo Uploaded By: Gretta Cool, BSN, RN, CWS, Kim on 07/09/2019 12:55:38 Wound Measurements Length: (cm) 0 % Width: (cm) 0 % Depth: (cm) 0 Area: (cm) 0 Volume: (cm) 0 Reduction in Area: 100% Reduction in Volume: 100% Wound Description Full Thickness Without Exposed Support Classification: Structures Electronic Signature(s) Signed: 07/09/2019 5:19:38 PM By: Gretta Cool, BSN, RN, CWS, Kim RN, BSN Entered By: Gretta Cool, BSN, RN, CWS, Kim on 07/09/2019 12:54:55 Firman, [name] ([id number]) -------------------------------------------------------------------------------- Wound Assessment Details Patient Name: Barrilleaux, Arnetha H. Date of Service: 07/09/2019 12:45 PM Medical Record Number: [id number] Patient Account Number: [id number] Date of Birth/Sex: 1927-12-09 (85 y.o. F) Treating RN: [name] Barman Primary Care Amiee Wiley: Park Liter Other Clinician: Referring Alyse Kathan: Park Liter Treating Willa Brocks/Extender: [name], [name] Weeks in Treatment: 17 Wound Status Wound Number: 17 Primary Etiology: Trauma, Other Wound Location: Right, Anterior Forearm Wound Status: Healed - Epithelialized Wounding Event: Trauma Date Acquired: 06/23/2019 Weeks Of Treatment: 2 Clustered Wound: No Photos Photo Uploaded By: Gretta Cool, BSN, RN, CWS, Kim on 07/09/2019 12:55:39 Wound Measurements Length: (cm) 0 % Width: (cm) 0 % Depth: (cm) 0 Area: (cm) 0 Volume: (cm) 0 Reduction in Area: 100% Reduction in Volume: 100%  Wound Description Full Thickness Without Exposed Support Classification: Structures Electronic Signature(s) Signed: 07/09/2019 5:19:38 PM By: Gretta Cool, BSN, RN, CWS, Kim RN, BSN Entered By: Gretta Cool, BSN, RN, CWS, Kim on 07/09/2019 12:54:56 ELAIN, KOELLER ([id number]) -------------------------------------------------------------------------------- Vitals Details Patient Name: Carol Games. Date of Service: 07/09/2019 12:45  PM Medical Record Number: [id number] Patient Account Number: [id number] Date of Birth/Sex: March 14, 1928 (85 y.o. F) Treating RN: [name] Primary Care Kenyata Napier: Park Liter Other Clinician: Referring Yaeli Hartung: Park Liter Treating Anhelica Fowers/Extender: [name], [name] Weeks in Treatment: 17 Vital Signs Time Taken: 12:50 Temperature (F): 98.7 Height (in): 60 Pulse (bpm): 61 Weight (lbs): 92 Respiratory Rate (breaths/min): 16 Body Mass Index (BMI): 18 Blood Pressure (mmHg): 146/54 Reference Range: 80 - 120 mg / dl Electronic Signature(s) Signed: 07/09/2019 4:57:32 PM By: Lorine Bears RCP, RRT, CHT Entered By: Lorine Bears on 07/09/2019 12:53:55

## 2019-07-10 DIAGNOSIS — J449 Chronic obstructive pulmonary disease, unspecified: Secondary | ICD-10-CM | POA: Diagnosis not present

## 2019-07-10 DIAGNOSIS — J432 Centrilobular emphysema: Secondary | ICD-10-CM | POA: Diagnosis not present

## 2019-07-21 ENCOUNTER — Ambulatory Visit (INDEPENDENT_AMBULATORY_CARE_PROVIDER_SITE_OTHER): Payer: Medicare Other | Admitting: Family Medicine

## 2019-07-21 ENCOUNTER — Other Ambulatory Visit: Payer: Self-pay

## 2019-07-21 ENCOUNTER — Encounter: Payer: Self-pay | Admitting: Family Medicine

## 2019-07-21 ENCOUNTER — Other Ambulatory Visit: Payer: Self-pay | Admitting: Family Medicine

## 2019-07-21 VITALS — BP 120/70 | HR 69 | Temp 97.9°F | Wt 92.0 lb

## 2019-07-21 DIAGNOSIS — J432 Centrilobular emphysema: Secondary | ICD-10-CM | POA: Diagnosis not present

## 2019-07-21 DIAGNOSIS — I480 Paroxysmal atrial fibrillation: Secondary | ICD-10-CM

## 2019-07-21 DIAGNOSIS — D500 Iron deficiency anemia secondary to blood loss (chronic): Secondary | ICD-10-CM

## 2019-07-21 DIAGNOSIS — Z85038 Personal history of other malignant neoplasm of large intestine: Secondary | ICD-10-CM

## 2019-07-21 DIAGNOSIS — N3281 Overactive bladder: Secondary | ICD-10-CM | POA: Diagnosis not present

## 2019-07-21 DIAGNOSIS — I701 Atherosclerosis of renal artery: Secondary | ICD-10-CM

## 2019-07-21 DIAGNOSIS — R8281 Pyuria: Secondary | ICD-10-CM | POA: Diagnosis not present

## 2019-07-21 DIAGNOSIS — E039 Hypothyroidism, unspecified: Secondary | ICD-10-CM

## 2019-07-21 DIAGNOSIS — I10 Essential (primary) hypertension: Secondary | ICD-10-CM

## 2019-07-21 DIAGNOSIS — D692 Other nonthrombocytopenic purpura: Secondary | ICD-10-CM | POA: Diagnosis not present

## 2019-07-21 DIAGNOSIS — F339 Major depressive disorder, recurrent, unspecified: Secondary | ICD-10-CM

## 2019-07-21 DIAGNOSIS — E440 Moderate protein-calorie malnutrition: Secondary | ICD-10-CM

## 2019-07-21 DIAGNOSIS — F419 Anxiety disorder, unspecified: Secondary | ICD-10-CM

## 2019-07-21 DIAGNOSIS — I471 Supraventricular tachycardia: Secondary | ICD-10-CM

## 2019-07-21 MED ORDER — ENSURE ENLIVE PO LIQD: 237.0000 mL | Freq: Two times a day (BID) | ORAL | 12 refills | Status: DC

## 2019-07-21 MED ORDER — METOPROLOL TARTRATE 50 MG PO TABS: 50.0000 mg | ORAL_TABLET | Freq: Two times a day (BID) | ORAL | 1 refills | Status: DC

## 2019-07-21 MED ORDER — SOLIFENACIN SUCCINATE 10 MG PO TABS: 10.0000 mg | ORAL_TABLET | Freq: Every day | ORAL | 1 refills | Status: DC

## 2019-07-21 MED ORDER — AMLODIPINE BESYLATE 2.5 MG PO TABS: 2.5000 mg | ORAL_TABLET | Freq: Every day | ORAL | 1 refills | Status: DC

## 2019-07-21 MED ORDER — FLUTICASONE-SALMETEROL 250-50 MCG/DOSE IN AEPB: 1.0000 | INHALATION_SPRAY | Freq: Every day | RESPIRATORY_TRACT | 6 refills | Status: DC

## 2019-07-21 MED ORDER — ASPIRIN EC 81 MG PO TBEC: 81.0000 mg | DELAYED_RELEASE_TABLET | Freq: Every day | ORAL | 4 refills | Status: DC

## 2019-07-21 MED ORDER — FLUTICASONE PROPIONATE 50 MCG/ACT NA SUSP: 2.0000 | Freq: Every day | NASAL | 6 refills | Status: DC

## 2019-07-21 NOTE — Progress Notes (Signed)
BP 120/70 (BP Location: Right Leg, Cuff Size: Normal)   Pulse 69   Temp 97.9 F (36.6 C)   Wt 92 lb (41.7 kg)   SpO2 97%   BMI 17.97 kg/m    Subjective:    Patient ID: [name], female    DOB: 1927-11-16, 85 y.o.   MRN: [id number]  HPI: [name] is a 85 y.o. female  Chief Complaint  Patient presents with  . Hypertension  . COPD  . Depression   HYPERTENSION Hypertension status: uncontrolled  Satisfied with current treatment? yes Duration of hypertension: chronic BP monitoring frequency:  not checking BP medication side effects:  no Medication compliance: excellent compliance Previous BP meds: Aspirin: no Recurrent headaches: no Visual changes: no Palpitations: no Dyspnea: no Chest pain: no Lower extremity edema: no Dizzy/lightheaded: yes  COPD COPD status: stable Satisfied with current treatment?: yes Oxygen use: yes Dyspnea frequency: with exertion Cough frequency: several times a day Rescue inhaler frequency:  Hasn't been using it Limitation of activity: yes Productive cough: no Pneumovax: Up to Date Influenza: Up to Date  HYPOTHYROIDISM Thyroid control status:controlled Satisfied with current treatment? yes Medication side effects: no Medication compliance: excellent compliance Etiology of hypothyroidism:  Recent dose adjustment:no Fatigue: no Cold intolerance: no Heat intolerance: no Weight gain: no Weight loss: no Constipation: yes Diarrhea/loose stools: no Palpitations: no Lower extremity edema: yes Anxiety/depressed mood: no  DEPRESSION Mood status: controlled Satisfied with current treatment?: yes Symptom severity: mild  Duration of current treatment : chronic Side effects: not on anything Previous psychiatric medications: nortriptyline- sleep walking Depressed mood: no Anxious mood: no Anhedonia: no Significant weight loss or gain: no Insomnia: no  Fatigue: no Feelings of worthlessness or guilt: no  Impaired concentration/indecisiveness: no Suicidal ideations: no Hopelessness: no Crying spells: no Depression screen [facility] 2/9 07/21/2019 05/28/2019 12/08/2018 05/19/2018  Decreased Interest 0 0 0 0  Down, Depressed, Hopeless 0 1 0 0  PHQ - 2 Score 0 1 0 0  Altered sleeping 0 - 0 -  Tired, decreased energy 1 - 0 -  Change in appetite 0 - 0 -  Feeling bad or failure about yourself  0 - 0 -  Trouble concentrating 0 - 0 -  Moving slowly or fidgety/restless 0 - 0 -  Suicidal thoughts 0 - 0 -  PHQ-9 Score 1 - 0 -  Difficult doing work/chores Not difficult at all - Not difficult at all -    Relevant past medical, surgical, family and social history reviewed and updated as indicated. Interim medical history since our last visit reviewed. Allergies and medications reviewed and updated.  Review of Systems  Constitutional: Negative.   Respiratory: Negative.   Cardiovascular: Negative.   Gastrointestinal: Negative.   Musculoskeletal: Negative.   Psychiatric/Behavioral: Negative.     Per HPI unless specifically indicated above     Objective:    BP 120/70 (BP Location: Right Leg, Cuff Size: Normal)   Pulse 69   Temp 97.9 F (36.6 C)   Wt 92 lb (41.7 kg)   SpO2 97%   BMI 17.97 kg/m   Wt Readings from Last 3 Encounters:  07/21/19 92 lb (41.7 kg)  06/24/19 92 lb (41.7 kg)  05/28/19 95 lb 12.8 oz (43.5 kg)    Physical Exam Vitals signs and nursing note reviewed.  Constitutional:      General: She is not in acute distress.    Appearance: Normal appearance. She is not ill-appearing, toxic-appearing or diaphoretic.  HENT:  Head: Normocephalic and atraumatic.     Right Ear: External ear normal.     Left Ear: External ear normal.     Nose: Nose normal.     Mouth/Throat:     Mouth: Mucous membranes are moist.     Pharynx: Oropharynx is clear.  Eyes:     General: No scleral icterus.       Right eye: No discharge.        Left eye: No discharge.     Extraocular Movements:  Extraocular movements intact.     Conjunctiva/sclera: Conjunctivae normal.     Pupils: Pupils are equal, round, and reactive to light.  Neck:     Musculoskeletal: Normal range of motion and neck supple.  Cardiovascular:     Rate and Rhythm: Normal rate and regular rhythm.     Pulses: Normal pulses.     Heart sounds: Normal heart sounds. No murmur. No friction rub. No gallop.   Pulmonary:     Effort: Pulmonary effort is normal. No respiratory distress.     Breath sounds: Normal breath sounds. No stridor. No wheezing, rhonchi or rales.  Chest:     Chest wall: No tenderness.  Musculoskeletal: Normal range of motion.  Skin:    General: Skin is warm and dry.     Capillary Refill: Capillary refill takes less than 2 seconds.     Coloration: Skin is not jaundiced or pale.     Findings: No bruising, erythema, lesion or rash.  Neurological:     General: No focal deficit present.     Mental Status: She is alert and oriented to person, place, and time. Mental status is at baseline.  Psychiatric:        Mood and Affect: Mood normal.        Behavior: Behavior normal.        Thought Content: Thought content normal.        Judgment: Judgment normal.     Results for orders placed or performed in visit on 07/21/19  Microscopic Examination   URINE  Result Value Ref Range   WBC, UA 6-10 (A) 0 - 5 /hpf   RBC None seen 0 - 2 /hpf   Epithelial Cells (non renal) 0-10 0 - 10 /hpf   Bacteria, UA Moderate (A) None seen/Few  Urine Culture, Reflex   URINE  Result Value Ref Range   Urine Culture, Routine Final report    Organism ID, Bacteria Comment   Microalbumin, Urine Waived  Result Value Ref Range   Microalb, Ur Waived 30 (H) 0 - 19 mg/L   Creatinine, Urine Waived 50 10 - 300 mg/dL   Microalb/Creat Ratio 30-300 (H) <30 mg/g  UA/M w/rflx Culture, Routine   Specimen: Urine   URINE  Result Value Ref Range   Specific Gravity, UA 1.020 1.005 - 1.030   pH, UA 7.0 5.0 - 7.5   Color, UA Yellow  Yellow   Appearance Ur Clear Clear   Leukocytes,UA 1+ (A) Negative   Protein,UA Negative Negative/Trace   Glucose, UA Negative Negative   Ketones, UA Negative Negative   RBC, UA Negative Negative   Bilirubin, UA Negative Negative   Urobilinogen, Ur 0.2 0.2 - 1.0 mg/dL   Nitrite, UA Negative Negative   Microscopic Examination See below:    Urinalysis Reflex Comment       Assessment & Plan:   Problem List Items Addressed This Visit      Cardiovascular and Mediastinum   PAF (paroxysmal  atrial fibrillation) ([place])    Continue to follow with cardiology. HR under good control. Continue to monitor. Call with any concerns.       Relevant Medications   amLODipine (NORVASC) 2.5 MG tablet   aspirin EC 81 MG tablet   metoprolol tartrate (LOPRESSOR) 50 MG tablet   Other Relevant Orders   CBC with Differential OUT   Comp Met (CMET)   Lipid Panel w/o Chol/HDL Ratio OUT   Essential hypertension - Primary    Under good control on current regimen. Continue current regimen. Continue to monitor. Call with any concerns. Refills given. Labs drawn today.        Relevant Medications   amLODipine (NORVASC) 2.5 MG tablet   aspirin EC 81 MG tablet   metoprolol tartrate (LOPRESSOR) 50 MG tablet   Other Relevant Orders   CBC with Differential OUT   Comp Met (CMET)   Lipid Panel w/o Chol/HDL Ratio OUT   Microalbumin, Urine Waived (Completed)   PSVT (paroxysmal supraventricular tachycardia) (HCC)    Continue to follow with cardiology. HR under good control. Continue to monitor. Call with any concerns.       Relevant Medications   amLODipine (NORVASC) 2.5 MG tablet   aspirin EC 81 MG tablet   metoprolol tartrate (LOPRESSOR) 50 MG tablet   Renal artery stenosis (HCC)    BP under good control. Continue to monitor.       Relevant Medications   amLODipine (NORVASC) 2.5 MG tablet   aspirin EC 81 MG tablet   metoprolol tartrate (LOPRESSOR) 50 MG tablet   Senile purpura ([place])    Reassured  patient. Continue to monitor.       Relevant Medications   amLODipine (NORVASC) 2.5 MG tablet   aspirin EC 81 MG tablet   metoprolol tartrate (LOPRESSOR) 50 MG tablet   Other Relevant Orders   CBC with Differential OUT   Comp Met (CMET)   Lipid Panel w/o Chol/HDL Ratio OUT     Respiratory   COPD (chronic obstructive pulmonary disease) (HCC)    Under good control on current regimen. Continue current regimen. Continue to monitor. Call with any concerns. Refills given. Labs checked today.       Relevant Medications   fluticasone (FLONASE) 50 MCG/ACT nasal spray   Fluticasone-Salmeterol (ADVAIR DISKUS) 250-50 MCG/DOSE AEPB   Other Relevant Orders   CBC with Differential OUT   Comp Met (CMET)   Lipid Panel w/o Chol/HDL Ratio OUT     Endocrine   Hypothyroidism    Labs drawn today. Await results. Adjust medication as needed.       Relevant Medications   metoprolol tartrate (LOPRESSOR) 50 MG tablet   Other Relevant Orders   CBC with Differential OUT   Comp Met (CMET)   Lipid Panel w/o Chol/HDL Ratio OUT   TSH     Genitourinary   Overactive bladder    Stable. Checking urine today. Await results. Call with any concerns.       Relevant Orders   CBC with Differential OUT   Comp Met (CMET)   Lipid Panel w/o Chol/HDL Ratio OUT   UA/M w/rflx Culture, Routine (Completed)     Other   Depression    Under good control on current regimen. Continue current regimen. Continue to monitor. Call with any concerns. Refills given.        Relevant Orders   CBC with Differential OUT   Comp Met (CMET)   Lipid Panel w/o Chol/HDL Ratio OUT  Anxiety    Under good control on current regimen. Continue current regimen. Continue to monitor. Call with any concerns. Refills given.        Relevant Orders   CBC with Differential OUT   Comp Met (CMET)   Lipid Panel w/o Chol/HDL Ratio OUT   Protein-calorie malnutrition (HCC)    Weight stable. Continue to monitor. Call with any concerns.        Relevant Orders   CBC with Differential OUT   Comp Met (CMET)   Lipid Panel w/o Chol/HDL Ratio OUT   History of colon cancer    Needs CEA every 6 months. Continue to follow with oncology. Call with any concerns.       Relevant Orders   CBC with Differential OUT   Comp Met (CMET)   Lipid Panel w/o Chol/HDL Ratio OUT   Iron deficiency anemia due to chronic blood loss    Checking labs today. Await results. Call with any concerns.       Relevant Orders   CBC with Differential OUT   Comp Met (CMET)   Iron Binding Cap (TIBC)   Lipid Panel w/o Chol/HDL Ratio OUT   Ferritin       Follow up plan: Return in about 6 months (around 01/18/2020).

## 2019-07-22 ENCOUNTER — Other Ambulatory Visit: Payer: Medicare Other

## 2019-07-22 ENCOUNTER — Telehealth: Payer: Self-pay

## 2019-07-22 LAB — IRON AND TIBC
Iron Saturation: 24 % (ref 15–55)
Iron: 73 ug/dL (ref 27–139)
Total Iron Binding Capacity: 300 ug/dL (ref 250–450)
UIBC: 227 ug/dL (ref 118–369)

## 2019-07-22 LAB — CBC WITH DIFFERENTIAL/PLATELET
Basophils Absolute: 0.1 10*3/uL (ref 0.0–0.2)
Basos: 1 %
EOS (ABSOLUTE): 0.2 10*3/uL (ref 0.0–0.4)
Eos: 2 %
Hematocrit: 41.3 % (ref 34.0–46.6)
Hemoglobin: 14.2 g/dL (ref 11.1–15.9)
Immature Grans (Abs): 0 10*3/uL (ref 0.0–0.1)
Immature Granulocytes: 0 %
Lymphocytes Absolute: 3.2 10*3/uL — ABNORMAL HIGH (ref 0.7–3.1)
Lymphs: 34 %
MCH: 29.7 pg (ref 26.6–33.0)
MCHC: 34.4 g/dL (ref 31.5–35.7)
MCV: 86 fL (ref 79–97)
Monocytes Absolute: 0.9 10*3/uL (ref 0.1–0.9)
Monocytes: 9 %
Neutrophils Absolute: 5.3 10*3/uL (ref 1.4–7.0)
Neutrophils: 54 %
Platelets: 221 10*3/uL (ref 150–450)
RBC: 4.78 x10E6/uL (ref 3.77–5.28)
RDW: 13 % (ref 11.7–15.4)
WBC: 9.6 10*3/uL (ref 3.4–10.8)

## 2019-07-22 LAB — COMPREHENSIVE METABOLIC PANEL
ALT: 6 IU/L (ref 0–32)
AST: 21 IU/L (ref 0–40)
Albumin/Globulin Ratio: 1.7 (ref 1.2–2.2)
Albumin: 4.3 g/dL (ref 3.6–4.6)
Alkaline Phosphatase: 100 IU/L (ref 39–117)
BUN/Creatinine Ratio: 18 (ref 12–28)
BUN: 22 mg/dL (ref 8–27)
Bilirubin Total: 0.3 mg/dL (ref 0.0–1.2)
CO2: 26 mmol/L (ref 20–29)
Calcium: 9.8 mg/dL (ref 8.7–10.3)
Chloride: 97 mmol/L (ref 96–106)
Creatinine, Ser: 1.22 mg/dL — ABNORMAL HIGH (ref 0.57–1.00)
GFR calc Af Amer: 45 mL/min/{1.73_m2} — ABNORMAL LOW (ref >59)
GFR calc non Af Amer: 39 mL/min/{1.73_m2} — ABNORMAL LOW (ref >59)
Globulin, Total: 2.6 g/dL (ref 1.5–4.5)
Glucose: 97 mg/dL (ref 65–99)
Potassium: 5.1 mmol/L (ref 3.5–5.2)
Sodium: 137 mmol/L (ref 134–144)
Total Protein: 6.9 g/dL (ref 6.0–8.5)

## 2019-07-22 LAB — LIPID PANEL W/O CHOL/HDL RATIO
Cholesterol, Total: 155 mg/dL (ref 100–199)
HDL: 58 mg/dL (ref >39)
LDL Chol Calc (NIH): 76 mg/dL (ref 0–99)
Triglycerides: 116 mg/dL (ref 0–149)
VLDL Cholesterol Cal: 21 mg/dL (ref 5–40)

## 2019-07-22 LAB — TSH: TSH: 7.31 u[IU]/mL — ABNORMAL HIGH (ref 0.450–4.500)

## 2019-07-22 LAB — FERRITIN: Ferritin: 37 ng/mL (ref 15–150)

## 2019-07-24 LAB — URINE CULTURE, REFLEX

## 2019-07-24 LAB — MICROALBUMIN, URINE WAIVED
Creatinine, Urine Waived: 50 mg/dL (ref 10–300)
Microalb, Ur Waived: 30 mg/L — ABNORMAL HIGH (ref 0–19)

## 2019-07-24 LAB — UA/M W/RFLX CULTURE, ROUTINE
Bilirubin, UA: NEGATIVE
Glucose, UA: NEGATIVE
Ketones, UA: NEGATIVE
Nitrite, UA: NEGATIVE
Protein,UA: NEGATIVE
RBC, UA: NEGATIVE
Specific Gravity, UA: 1.02 (ref 1.005–1.030)
Urobilinogen, Ur: 0.2 mg/dL (ref 0.2–1.0)
pH, UA: 7 (ref 5.0–7.5)

## 2019-07-24 LAB — MICROSCOPIC EXAMINATION: RBC, Urine: NONE SEEN /hpf (ref 0–2)

## 2019-07-25 ENCOUNTER — Encounter: Payer: Self-pay | Admitting: Family Medicine

## 2019-07-25 NOTE — Assessment & Plan Note (Signed)
Weight stable. Continue to monitor. Call with any concerns.  

## 2019-07-25 NOTE — Assessment & Plan Note (Signed)
Labs drawn today. Await results. Adjust medication as needed.

## 2019-07-25 NOTE — Assessment & Plan Note (Signed)
Under good control on current regimen. Continue current regimen. Continue to monitor. Call with any concerns. Refills given. Labs drawn today.   

## 2019-07-25 NOTE — Assessment & Plan Note (Signed)
BP under good control. Continue to monitor.

## 2019-07-25 NOTE — Assessment & Plan Note (Signed)
Reassured patient. Continue to monitor.  

## 2019-07-25 NOTE — Assessment & Plan Note (Addendum)
Under good control on current regimen. Continue current regimen. Continue to monitor. Call with any concerns. Refills given. Labs checked today.  

## 2019-07-25 NOTE — Assessment & Plan Note (Signed)
Continue to follow with cardiology. HR under good control. Continue to monitor. Call with any concerns.

## 2019-07-25 NOTE — Assessment & Plan Note (Signed)
Under good control on current regimen. Continue current regimen. Continue to monitor. Call with any concerns. Refills given.   

## 2019-07-25 NOTE — Assessment & Plan Note (Signed)
Needs CEA every 6 months. Continue to follow with oncology. Call with any concerns.

## 2019-07-25 NOTE — Assessment & Plan Note (Signed)
Stable. Checking urine today. Await results. Call with any concerns.

## 2019-07-25 NOTE — Assessment & Plan Note (Signed)
Checking labs today. Await results. Call with any concerns.  

## 2019-07-27 ENCOUNTER — Telehealth: Payer: Self-pay | Admitting: Family Medicine

## 2019-07-27 DIAGNOSIS — E039 Hypothyroidism, unspecified: Secondary | ICD-10-CM

## 2019-07-27 MED ORDER — LEVOTHYROXINE SODIUM 100 MCG PO TABS: 100.0000 ug | ORAL_TABLET | Freq: Every day | ORAL | 1 refills | Status: DC

## 2019-07-27 NOTE — Telephone Encounter (Addendum)
Please let her know that her labs came back normal except her thyroid is under-treated. Can we confirm she's been taking her medicine every day. If not take it every day and we'll recheck in a month. If she has, I'm going to increase her to 168mcg and we'll recheck in 6 weeks. Thanks!

## 2019-07-27 NOTE — Telephone Encounter (Signed)
Patient notified. She states she has been taking medication as prescribed, missed 2 doses earlier in the month. Lab appointment scheduled for 6 weeks.

## 2019-08-05 ENCOUNTER — Other Ambulatory Visit: Payer: Self-pay

## 2019-08-05 ENCOUNTER — Telehealth: Payer: Self-pay

## 2019-08-05 ENCOUNTER — Ambulatory Visit: Payer: Self-pay | Admitting: Licensed Clinical Social Worker

## 2019-08-05 DIAGNOSIS — C183 Malignant neoplasm of hepatic flexure: Secondary | ICD-10-CM

## 2019-08-05 NOTE — Chronic Care Management (AMB) (Signed)
  Care Management   Follow Up Note   08/05/2019 Name: [name] MRN: [id number] DOB: 15-Nov-1927  Referred by: Valerie Roys, DO Reason for referral : Prentice is a 84 y.o. year old female who is a primary care patient of Valerie Roys, DO. The care management team was consulted for assistance with care management and care coordination needs.    Review of patient status, including review of consultants reports, relevant laboratory and other test results, and collaboration with appropriate care team members and the patient's provider was performed as part of comprehensive patient evaluation and provision of chronic care management services.    LCSW completed CCM outreach attempt today but was unable to reach patient successfully. A HIPPA compliant voice message was left encouraging patient to return call once available. LCSW rescheduled CCM SW appointment as well.  A HIPPA compliant phone message was left for the patient providing contact information and requesting a return call.   Eula Fried, BSW, MSW, [facility] Care Management [email or web address] Phone: [phone]

## 2019-08-05 NOTE — Telephone Encounter (Signed)
Message left to see when patient would like to have her CT chest, abdomen, and pelvis done and also needs labs and follow up with Dr Hampton Abbot.

## 2019-08-09 DIAGNOSIS — J432 Centrilobular emphysema: Secondary | ICD-10-CM | POA: Diagnosis not present

## 2019-08-09 DIAGNOSIS — J449 Chronic obstructive pulmonary disease, unspecified: Secondary | ICD-10-CM | POA: Diagnosis not present

## 2019-08-31 ENCOUNTER — Telehealth: Payer: Self-pay

## 2019-08-31 NOTE — Telephone Encounter (Signed)
Message left for patient to let her know that it was time for her follow up with Dr Kirke Corin. She needs a CT chest, abdomen, and pelvis and also CEA labs and then follow up in office.

## 2019-08-31 NOTE — Telephone Encounter (Signed)
Spoke with patient's daughter who was returning a call. Pt's daughter states she would rather her mother to come in to discuss the next steps due to her mother being elderly. Pt's daughter states she recently had an doctor's appt. And was told that her mother was cancer free. Patient is scheduled 09/09/19 at 10:45am with Dr.Piscoya.

## 2019-09-07 ENCOUNTER — Other Ambulatory Visit: Payer: Self-pay

## 2019-09-07 ENCOUNTER — Other Ambulatory Visit: Payer: Medicare Other

## 2019-09-07 DIAGNOSIS — E039 Hypothyroidism, unspecified: Secondary | ICD-10-CM | POA: Diagnosis not present

## 2019-09-08 ENCOUNTER — Telehealth: Payer: Self-pay | Admitting: Family Medicine

## 2019-09-08 LAB — TSH: TSH: 8.91 u[IU]/mL — ABNORMAL HIGH (ref 0.450–4.500)

## 2019-09-08 NOTE — Chronic Care Management (AMB) (Signed)
  Care Management   Note  09/08/2019 Name: [name] MRN: [id number] DOB: [birth year]  [name] is a 86 y.o. year old female who is a primary care patient of Valerie Roys, DO and is actively engaged with the care management team. I reached out to [name] by phone today to assist with re-scheduling a Initial outreach appointment with the RN Case Manager  Follow up plan: Patient declines further follow up and engagement by the care management team at his time. Until we are able to do a face to face due to patients hearing. Appropriate care team members and provider have been notified via electronic communication.   [place], [place] [phone] Direct Dial: [email or web address]  Website: [email or web address]

## 2019-09-09 ENCOUNTER — Other Ambulatory Visit: Payer: Self-pay

## 2019-09-09 ENCOUNTER — Ambulatory Visit (INDEPENDENT_AMBULATORY_CARE_PROVIDER_SITE_OTHER): Payer: Medicare Other | Admitting: Surgery

## 2019-09-09 ENCOUNTER — Encounter: Payer: Self-pay | Admitting: Surgery

## 2019-09-09 VITALS — BP 158/74 | HR 66 | Temp 97.7°F | Ht 60.0 in | Wt 98.2 lb

## 2019-09-09 DIAGNOSIS — J449 Chronic obstructive pulmonary disease, unspecified: Secondary | ICD-10-CM | POA: Diagnosis not present

## 2019-09-09 DIAGNOSIS — C183 Malignant neoplasm of hepatic flexure: Secondary | ICD-10-CM | POA: Diagnosis not present

## 2019-09-09 DIAGNOSIS — J432 Centrilobular emphysema: Secondary | ICD-10-CM | POA: Diagnosis not present

## 2019-09-09 NOTE — Patient Instructions (Addendum)
Patient will have CEA (Carcinoembryonic Antigen), CBC and CMP drawn at [facility].   Patient may try stool softeners and over the counter Miralax to help with discomfort and constipation.   Lab work may take up to 1-2 business days for the results to come in.

## 2019-09-09 NOTE — Progress Notes (Signed)
09/09/2019  History of Present Illness: [name] is a 86 y.o. female s/p laparoscopic right colectomy for hepatic flexure colon cancer on 06/26/18.  She presents today for follow up.  She had her colonoscopy done on 10/21 and was negative for any suspicious findings.  No biopsies were needed.  Her anastomosis was widely patent and healthy.  She has not had her CEA yet or CT scan, and she's here asking if any of this is needed.  She reports that she's only had some issues with constipation, but otherwise has been doing well.  Denies any weight loss, blood in her stool, abdominal pain, or other concerns.  Past Medical History: Past Medical History:  Diagnosis Date  . Anxiety   . Atrial fibrillation ([place])   . Cancer ([place])    COLON CANCER STAGE 2  . Clotting disorder (HCC)    left ankle  . COPD (chronic obstructive pulmonary disease) ([place])   . Depression   . Hypertension   . Hypothyroidism   . IDA (iron deficiency anemia)   . Melena 06/22/2018  . Obstructive chronic bronchitis without exacerbation ([place]) 12/18/2016  . Overactive bladder   . Thyroid disease      Past Surgical History: Past Surgical History:  Procedure Laterality Date  . ABDOMINAL HYSTERECTOMY    . BREAST LUMPECTOMY    . COLONOSCOPY Left 06/23/2018   Procedure: COLONOSCOPY;  Surgeon: Jonathon Bellows, MD;  Location: [facility] ENDOSCOPY;  Service: Gastroenterology;  Laterality: Left;  Colonoscopy first. Dr. Vicente Males will decide if EGD is needed after colonoscopy  . COLONOSCOPY WITH PROPOFOL N/A 06/24/2019   Procedure: COLONOSCOPY WITH PROPOFOL;  Surgeon: Toledo, Benay Pike, MD;  Location: [facility] ENDOSCOPY;  Service: Gastroenterology;  Laterality: N/A;  . ESOPHAGOGASTRODUODENOSCOPY Left 06/23/2018   Procedure: ESOPHAGOGASTRODUODENOSCOPY (EGD);  Surgeon: Jonathon Bellows, MD;  Location[facility] ENDOSCOPY;  Service: Gastroenterology;  Laterality: Left;  Colonoscopy first. Dr. Vicente Males will decide if EGD is needed after colonoscopy  .  LAPAROSCOPIC RIGHT COLECTOMY Right 06/26/2018   Procedure: LAPAROSCOPIC RIGHT COLECTOMY;  Surgeon: Olean Ree, MD;  Location: [facility] ORS;  Service: General;  Laterality: Right;    Home Medications: Prior to Admission medications   Medication Sig Start Date End Date Taking? Authorizing Provider  acetaminophen (TYLENOL) 325 MG tablet Take 2 tablets (650 mg total) by mouth every 6 (six) hours as needed for mild pain or fever. 07/01/18  Yes Kayman Snuffer, Jacqulyn Bath, MD  amLODipine (NORVASC) 2.5 MG tablet Take 1 tablet (2.5 mg total) by mouth daily. 07/21/19  Yes Johnson, Megan P, DO  aspirin EC 81 MG tablet Take 1 tablet (81 mg total) by mouth daily. 07/21/19  Yes Johnson, Megan P, DO  feeding supplement, ENSURE ENLIVE, (ENSURE ENLIVE) LIQD Take 237 mLs by mouth 2 (two) times daily between meals. 07/21/19  Yes Johnson, Megan P, DO  ferrous sulfate 325 (65 FE) MG tablet Take 1 tablet (325 mg total) by mouth 3 (three) times daily with meals. 01/30/19  Yes Johnson, Megan P, DO  fluticasone (FLONASE) 50 MCG/ACT nasal spray Place 2 sprays into both nostrils daily. 07/21/19  Yes Johnson, Megan P, DO  Fluticasone-Salmeterol (ADVAIR DISKUS) 250-50 MCG/DOSE AEPB Inhale 1 puff into the lungs daily. Rinse mouth after use. 07/21/19 07/20/20 Yes Johnson, Megan P, DO  latanoprost (XALATAN) 0.005 % ophthalmic solution INSTILL 1 DROP INTO EACH EYE AT BEDTIME 11/24/18  Yes [provider]  levothyroxine (SYNTHROID) 100 MCG tablet Take 1 tablet (100 mcg total) by mouth daily before breakfast. TAKE 1 TABLET  BY MOUTH ONCE DAILY BEFORE BREAKFAST 07/27/19  Yes Johnson, Megan P, DO  metoprolol tartrate (LOPRESSOR) 50 MG tablet Take 1 tablet (50 mg total) by mouth 2 (two) times daily. 07/21/19  Yes Johnson, Megan P, DO  multivitamin-lutein (OCUVITE-LUTEIN) CAPS capsule Take 1 capsule by mouth daily. 07/02/18  Yes Ronold Hardgrove, Jacqulyn Bath, MD  OXYGEN Inhale 2 L into the lungs. As needed during the day, and full use at bedtime   Yes  [provider]  solifenacin (VESICARE) 10 MG tablet Take 1 tablet (10 mg total) by mouth daily. 07/21/19  Yes Johnson, Megan P, DO    Allergies: Allergies  Allergen Reactions  . Nortriptyline     Causes night wondering    Review of Systems: Review of Systems  Constitutional: Negative for chills and fever.  Respiratory: Negative for shortness of breath.   Cardiovascular: Negative for chest pain.  Gastrointestinal: Positive for constipation. Negative for abdominal pain, nausea and vomiting.    Physical Exam BP (!) 158/74   Pulse 66   Temp 97.7 F (36.5 C) (Temporal)   Ht 5' (1.524 m)   Wt 44.5 kg   SpO2 92%   BMI 19.18 kg/m  CONSTITUTIONAL: No acute distress HEENT:  Normocephalic, atraumatic, extraocular motion intact. RESPIRATORY:  Lungs are clear, and breath sounds are equal bilaterally. Normal respiratory effort without pathologic use of accessory muscles. CARDIOVASCULAR: Heart is regular without murmurs, gallops, or rubs. GI: The abdomen is soft, non-distended, non-tender to palpation.  Well healed scars from her laparoscopic surgery. There were no palpable masses. NEUROLOGIC:  Motor and sensation is grossly normal.  Cranial nerves are grossly intact. PSYCH:  Alert and oriented to person, place and time. Affect is normal.  Labs/Imaging: Colonoscopy 06/24/19: IMPRESSION: - Diverticulosis in the sigmoid colon. - Patent end-to-side ileo-colonic anastomosis, characterized by healthy appearing mucosa. - The examined portion of the ileum was normal. - Non-bleeding internal hemorrhoids. - The examination was otherwise normal. - No specimens collected.  Assessment and Plan: This is a 86 y.o. female s/p laparoscopic right colectomy for hepatic flexure colon cancer.  Patient is now a year out from her surgery and is doing well.  Other than some constipation, there are no issues at this point.  Suggested that she can use stool softeners or MiraLax for her  constipation as needed.  From the surveillance standpoint, Dr. Rogue Bussing had discussed with her that given her age, imaging would not be recommended.  Her colonoscopy did not reveal any suspicious findings, and she has otherwise aged out of colonoscopies as well.  I think at this point there is no urgent need for a CT scan.  After asking the patient, she's not too sure that she would want to do anything if this cancer were to recur.  Even more reason to avoid imaging at this point.  For now, I recommended that we obtain a CEA.  If the CEA is elevated, then I will discuss further with the patient if she would like any further imaging or studies done.  But if CEA is otherwise normal, then I do not think we would need imaging.    Will call patient once CEA is resulted, and otherwise will follow up in 6 months.  Face-to-face time spent with the patient and care providers was 15 minutes, with more than 50% of the time spent counseling, educating, and coordinating care of the patient.     Melvyn Neth, [facility]

## 2019-09-10 ENCOUNTER — Encounter: Payer: Medicare Other | Attending: Physician Assistant | Admitting: Physician Assistant

## 2019-09-10 ENCOUNTER — Other Ambulatory Visit: Payer: Self-pay

## 2019-09-10 DIAGNOSIS — I10 Essential (primary) hypertension: Secondary | ICD-10-CM | POA: Insufficient documentation

## 2019-09-10 DIAGNOSIS — W228XXA Striking against or struck by other objects, initial encounter: Secondary | ICD-10-CM | POA: Diagnosis not present

## 2019-09-10 DIAGNOSIS — Z87891 Personal history of nicotine dependence: Secondary | ICD-10-CM | POA: Diagnosis not present

## 2019-09-10 DIAGNOSIS — J449 Chronic obstructive pulmonary disease, unspecified: Secondary | ICD-10-CM | POA: Insufficient documentation

## 2019-09-10 DIAGNOSIS — S8991XA Unspecified injury of right lower leg, initial encounter: Secondary | ICD-10-CM | POA: Diagnosis not present

## 2019-09-10 DIAGNOSIS — I872 Venous insufficiency (chronic) (peripheral): Secondary | ICD-10-CM | POA: Insufficient documentation

## 2019-09-10 DIAGNOSIS — I4891 Unspecified atrial fibrillation: Secondary | ICD-10-CM | POA: Insufficient documentation

## 2019-09-10 DIAGNOSIS — S81801A Unspecified open wound, right lower leg, initial encounter: Secondary | ICD-10-CM | POA: Diagnosis not present

## 2019-09-10 NOTE — Progress Notes (Addendum)
KIMMEY, ROWSEY ([id number]) Visit Report for 09/10/2019 Allergy List Details Patient Name: [name], [name]. Date of Service: 09/10/2019 9:45 AM Medical Record Number: [id number] Patient Account Number: [id number] Date of Birth/Sex: [birth year] (86 y.o. F) Treating RN: Montey Hora Primary Care Brilyn Tuller: Park Liter Other Clinician: Referring Dorthula Bier: Park Liter Treating Mala Gibbard/Extender: [name], [name] Weeks in Treatment: 0 Allergies Active Allergies No Known Allergies Allergy Notes Electronic Signature(s) Signed: 09/10/2019 9:52:10 AM By: Montey Hora Entered By: Montey Hora on 09/10/2019 09:52:10 [name] -------------------------------------------------------------------------------- Arrival Information Details Patient Name: Carol Berger H. Date of Service: 09/10/2019 9:45 AM Medical Record Number: [id number] Patient Account Number: [id number] Date of Birth/Sex: 11/14/1927 (86 y.o. F) Treating RN: [name] Primary Care Eudelia Hiltunen: Park Liter Other Clinician: Referring Roshad Hack: Park Liter Treating Athenia Rys/Extender: [name], [name] Weeks in Treatment: 0 Visit Information Patient Arrived: Ambulatory Arrival Time: 09:52 Accompanied By: self Transfer Assistance: None Patient Identification Verified: Yes Secondary Verification Process Completed: Yes History Since Last Visit Added or deleted any medications: No Any new allergies or adverse reactions: No Had a fall or experienced change in activities of daily living that may affect risk of falls: No Signs or symptoms of abuse/neglect since last visito No Hospitalized since last visit: No Implantable device outside of the clinic excluding cellular tissue based products placed in the center since last visit: No Electronic Signature(s) Signed: 09/10/2019 4:13:51 PM By: Lorine Bears RCP, RRT, CHT Entered By: Lorine Bears on 09/10/2019  09:54:24 [name] -------------------------------------------------------------------------------- Clinic Level of Care Assessment Details Patient Name: [name], [name] Date of Service: 09/10/2019 9:45 AM Medical Record Number: [id number] Patient Account Number: [id number] Date of Birth/Sex: [birth year] (86 y.o. F) Treating RN: [name] Primary Care Gildardo Tickner: Park Liter Other Clinician: Referring Miyoko Hashimi: Park Liter Treating Amarrah Meinhart/Extender: [name], [name] Weeks in Treatment: 0 Clinic Level of Care Assessment Items TOOL 1 Quantity Score []  - Use when EandM and Procedure is performed on INITIAL visit 0 ASSESSMENTS - Nursing Assessment / Reassessment X - General Physical Exam (combine w/ comprehensive assessment (listed just below) when 1 20 performed on new pt. evals) X- 1 25 Comprehensive Assessment (HX, ROS, Risk Assessments, Wounds Hx, etc.) ASSESSMENTS - Wound and Skin Assessment / Reassessment []  - Dermatologic / Skin Assessment (not related to wound area) 0 ASSESSMENTS - Ostomy and/or Continence Assessment and Care []  - Incontinence Assessment and Management 0 []  - 0 Ostomy Care Assessment and Management (repouching, etc.) PROCESS - Coordination of Care X - Simple Patient / Family Education for ongoing care 1 15 []  - 0 Complex (extensive) Patient / Family Education for ongoing care X- 1 10 Staff obtains Programmer, systems, Records, Test Results / Process Orders []  - 0 Staff telephones HHA, Nursing Homes / Clarify orders / etc []  - 0 Routine Transfer to another Facility (non-emergent condition) []  - 0 Routine Hospital Admission (non-emergent condition) X- 1 15 New Admissions / Biomedical engineer / Ordering NPWT, Apligraf, etc. []  - 0 Emergency Hospital Admission (emergent condition) PROCESS - Special Needs []  - Pediatric / Minor Patient Management 0 []  - 0 Isolation Patient Management []  - 0 Hearing / Language / Visual special  needs []  - 0 Assessment of Community assistance (transportation, D/C planning, etc.) []  - 0 Additional assistance / Altered mentation []  - 0 Support Surface(s) Assessment (bed, cushion, seat, etc.) [name], [name] ([id number]) INTERVENTIONS - Miscellaneous []  - External ear exam 0 []  - 0 Patient Transfer (multiple staff / Civil Service fast streamer / Similar devices) []  -  0 Simple Staple / Suture removal (25 or less) []  - 0 Complex Staple / Suture removal (26 or more) []  - 0 Hypo/Hyperglycemic Management (do not check if billed separately) X- 1 15 Ankle / Brachial Index (ABI) - do not check if billed separately Has the patient been seen at the hospital within the last three years: Yes Total Score: 100 Level Of Care: New/Established - Level 3 Electronic Signature(s) Signed: 09/10/2019 11:26:15 AM By: [name] Entered By: [name] on 09/10/2019 10:32:11 [name] -------------------------------------------------------------------------------- Encounter Discharge Information Details Patient Name: Carol Berger H. Date of Service: 09/10/2019 9:45 AM Medical Record Number: [id number] Patient Account Number: [id number] Date of Birth/Sex: 09/09/1927 (86 y.o. F) Treating RN: [name] Primary Care Kadeshia Kasparian: Park Liter Other Clinician: Referring Lakeena Downie: Park Liter Treating Yexalen Deike/Extender: [name], [name] Weeks in Treatment: 0 Encounter Discharge Information Items Post Procedure Vitals Discharge Condition: Stable Temperature (F): 97.8 Ambulatory Status: Ambulatory Pulse (bpm): 70 Discharge Destination: Home Respiratory Rate (breaths/min): 16 Transportation: Private Auto Blood Pressure (mmHg): 170/58 Accompanied By: self Schedule Follow-up Appointment: Yes Clinical Summary of Care: Electronic Signature(s) Signed: 09/10/2019 11:26:15 AM By: [name] Entered By: [name] on 09/10/2019 10:34:13 [name] -------------------------------------------------------------------------------- Lower Extremity Assessment Details Patient Name: [name], [name] Date of Service: 09/10/2019 9:45 AM Medical Record Number: [id number] Patient Account Number: [id number] Date of Birth/Sex: 05-12-1928 (86 y.o. F) Treating RN: Montey Hora Primary Care Gussie Towson: Park Liter Other Clinician: Referring Fontaine Kossman: Park Liter Treating Shirley Bolle/Extender: [name], [name] Weeks in Treatment: 0 Edema Assessment Assessed: [Left: No] [Right: No] Edema: [Left: No] [Right: No] Calf Left: Right: Point of Measurement: 30 cm From Medial Instep 30 cm 30 cm Ankle Left: Right: Point of Measurement: 10 cm From Medial Instep 18 cm 18 cm Vascular Assessment Pulses: Dorsalis Pedis Palpable: [Left:Yes] [Right:Yes] Doppler Audible: [Left:Yes] [Right:Yes] Posterior Tibial Palpable: [Left:Yes] [Right:Yes] Doppler Audible: [Left:Yes] [Right:Yes] Blood Pressure: Brachial: [Left:172] [Right:166] Dorsalis Pedis: 160 Ankle: [Left:Dorsalis Pedis: 154] Posterior Tibial: 158 Ankle Brachial Index: [Left:0.90] [Right:0.93] Electronic Signature(s) Signed: 09/10/2019 4:40:20 PM By: Montey Hora Entered By: Montey Hora on 09/10/2019 10:13:22 Plunk, [name] -------------------------------------------------------------------------------- Multi Wound Chart Details Patient Name: [name], [name] Date of Service: 09/10/2019 9:45 AM Medical Record Number: [id number] Patient Account Number: [id number] Date of Birth/Sex: August 26, 1928 (86 y.o. F) Treating RN: [name] Primary Care Marcille Barman: Park Liter Other Clinician: Referring Rithik Odea: Park Liter Treating Malisa Ruggiero/Extender: [name], [name] Weeks in Treatment: 0 Vital Signs Height(in): 60 Pulse(bpm): 70 Weight(lbs): 96 Blood Pressure(mmHg): 170/58 Body Mass Index(BMI): 19 Temperature(F): 97.8 Respiratory  Rate 16 (breaths/min): Photos: [N/A:N/A] Wound Location: Right Lower Leg - Anterior N/A N/A Wounding Event: Trauma N/A N/A Primary Etiology: Trauma, Other N/A N/A Comorbid History: Cataracts, Glaucoma, Asthma, N/A N/A Chronic Obstructive Pulmonary Disease (COPD), Arrhythmia, Hypertension Date Acquired: 09/08/2019 N/A N/A Weeks of Treatment: 0 N/A N/A Wound Status: Open N/A N/A Measurements L x W x D 2.7x2.8x0.1 N/A N/A (cm) Area (cm) : 5.938 N/A N/A Volume (cm) : 0.594 N/A N/A Classification: Full Thickness Without N/A N/A Exposed Support Structures Exudate Amount: Medium N/A N/A Exudate Type: Sanguinous N/A N/A Exudate Color: red N/A N/A Wound Margin: Flat and Intact N/A N/A Granulation Amount: None Present (0%) N/A N/A Necrotic Amount: Medium (34-66%) N/A N/A Exposed Structures: Fat Layer (Subcutaneous N/A N/A Tissue) Exposed: Yes Fascia: No Tendon: No Muscle: No Joint: No Bone: No Beckett, Kevia H. ([id number]) Epithelialization: None N/A N/A Treatment Notes Electronic Signature(s) Signed: 09/10/2019 11:26:15 AM By:  Nicki Reaper, Dajea Entered By: [name] on 09/10/2019 10:26:05 [name] ([id number]) -------------------------------------------------------------------------------- Multi-Disciplinary Care Plan Details Patient Name: [name], CARSTEN. Date of Service: 09/10/2019 9:45 AM Medical Record Number: [id number] Patient Account Number: [id number] Date of Birth/Sex: Aug 12, 1928 (86 y.o. F) Treating RN: [name] Primary Care Abbigal Radich: Park Liter Other Clinician: Referring Yan Okray: Park Liter Treating Jett Kulzer/Extender: [name], [name] Weeks in Treatment: 0 Active Inactive Orientation to the Wound Care Program Nursing Diagnoses: Knowledge deficit related to the wound healing center program Goals: Patient/caregiver will verbalize understanding of the [place] Program Date Initiated: 09/10/2019 Target Resolution Date:  10/09/2019 Goal Status: Active Interventions: Provide education on orientation to the wound center Notes: Wound/Skin Impairment Nursing Diagnoses: Impaired tissue integrity Goals: Ulcer/skin breakdown will have a volume reduction of 30% by week 4 Date Initiated: 09/10/2019 Target Resolution Date: 10/08/2019 Goal Status: Active Interventions: Assess ulceration(s) every visit Notes: Electronic Signature(s) Signed: 09/10/2019 11:26:15 AM By: [name] Entered By: [name] on 09/10/2019 10:25:47 [name], [name] ([id number]) -------------------------------------------------------------------------------- Pain Assessment Details Patient Name: Carol Berger H. Date of Service: 09/10/2019 9:45 AM Medical Record Number: [id number] Patient Account Number: [id number] Date of Birth/Sex: Dec 13, 1927 (86 y.o. F) Treating RN: [name] Primary Care Georgenia Salim: Park Liter Other Clinician: Referring Kekai Geter: Park Liter Treating Leeza Heiner/Extender: [name], [name] Weeks in Treatment: 0 Active Problems Location of Pain Severity and Description of Pain Patient Has Paino No Site Locations Pain Management and Medication Current Pain Management: Electronic Signature(s) Signed: 09/10/2019 11:26:15 AM By: [name] Signed: 09/10/2019 4:13:51 PM By: Lorine Bears RCP, RRT, CHT Entered By: Lorine Bears on 09/10/2019 09:54:32 [name], [name] ([id number]) -------------------------------------------------------------------------------- Patient/Caregiver Education Details Patient Name: [name] Date of Service: 09/10/2019 9:45 AM Medical Record Number: [id number] Patient Account Number: [id number] Date of Birth/Gender: 07-Jan-1928 (86 y.o. F) Treating RN: [name] Primary Care Physician: Park Liter Other Clinician: Referring Physician: Park Liter Treating Physician/Extender: Sharalyn Ink in Treatment: 0 Education  Assessment Education Provided To: Patient Education Topics Provided Wound/Skin Impairment: Handouts: Caring for Your Ulcer Methods: Demonstration, Explain/Verbal Responses: State content correctly Electronic Signature(s) Signed: 09/10/2019 11:26:15 AM By: [name] Entered By: [name] on 09/10/2019 10:33:00 [name], [name] ([id number]) -------------------------------------------------------------------------------- Wound Assessment Details Patient Name: [name], [name] Date of Service: 09/10/2019 9:45 AM Medical Record Number: [id number] Patient Account Number: [id number] Date of Birth/Sex: 08-May-1928 (86 y.o. F) Treating RN: Montey Hora Primary Care Adriyanna Christians: Park Liter Other Clinician: Referring Tilman Mcclaren: Park Liter Treating Dejon Lukas/Extender: [name], [name] Weeks in Treatment: 0 Wound Status Wound Number: [id number] Primary Trauma, Other Etiology: Wound Location: Right Lower Leg - Anterior Wound Open Wounding Event: Trauma Status: Date Acquired: 09/08/2019 Comorbid Cataracts, Glaucoma, Asthma, Chronic Weeks Of Treatment: 0 History: Obstructive Pulmonary Disease (COPD), Clustered Wound: No Arrhythmia, Hypertension Photos Wound Measurements Length: (cm) 2.7 Width: (cm) 2.8 Depth: (cm) 0.1 Area: (cm) 5.938 Volume: (cm) 0.594 % Reduction in Area: % Reduction in Volume: Epithelialization: None Tunneling: No Undermining: No Wound Description Full Thickness Without Exposed Support Foul Odo Classification: Structures Slough/F Wound Margin: Flat and Intact Exudate Medium Amount: Exudate Type: Sanguinous Exudate Color: red r After Cleansing: No ibrino Yes Wound Bed Granulation Amount: None Present (0%) Exposed Structure Necrotic Amount: Medium (34-66%) Fascia Exposed: No Necrotic Quality: Adherent Slough Fat Layer (Subcutaneous Tissue) Exposed: Yes Tendon Exposed: No Muscle Exposed: No Joint Exposed: No Bone Exposed: No Mackowiak, Carol  H. ([id number]) Treatment Notes Wound #18 (Right, Anterior Lower Leg) Notes xeroform, BFD Electronic Signature(s)  Signed: 09/10/2019 4:40:20 PM By: Montey Hora Entered By: Montey Hora on 09/10/2019 10:06:23 [name] ([id number]) -------------------------------------------------------------------------------- Vitals Details Patient Name: [name], [name] Date of Service: 09/10/2019 9:45 AM Medical Record Number: [id number] Patient Account Number: [id number] Date of Birth/Sex: 12-13-1927 (86 y.o. F) Treating RN: [name] Primary Care Chikita Dogan: Park Liter Other Clinician: Referring Osborn Pullin: Park Liter Treating Noelani Harbach/Extender: [name], [name] Weeks in Treatment: 0 Vital Signs Time Taken: 09:54 Temperature (F): 97.8 Height (in): 60 Pulse (bpm): 70 Source: Stated Respiratory Rate (breaths/min): 16 Weight (lbs): 96 Blood Pressure (mmHg): 170/58 Source: Measured Reference Range: 80 - 120 mg / dl Body Mass Index (BMI): 18.7 Electronic Signature(s) Signed: 09/10/2019 4:13:51 PM By: Lorine Bears RCP, RRT, CHT Entered By: Lorine Bears on 09/10/2019 09:55:16

## 2019-09-10 NOTE — Progress Notes (Addendum)
MARGERET, SLIVA ([id number]) Visit Report for 09/10/2019 Abuse/Suicide Risk Screen Details Patient Name: [name], [name]. Date of Service: 09/10/2019 9:45 AM Medical Record Number: [id number] Patient Account Number: [id number] Date of Birth/Sex: 01-Dec-1927 (86 y.o. F) Treating RN: Montey Hora Primary Care Tykira Wachs: Park Liter Other Clinician: Referring Kayton Dunaj: Park Liter Treating Laine Fonner/Extender: [name], [name] Weeks in Treatment: 0 Abuse/Suicide Risk Screen Items Answer ABUSE RISK SCREEN: Has anyone close to you tried to hurt or harm you recentlyo No Do you feel uncomfortable with anyone in your familyo No Has anyone forced you do things that you didnot want to doo No Electronic Signature(s) Signed: 09/10/2019 9:53:34 AM By: Montey Hora Entered By: Montey Hora on 09/10/2019 09:53:33 Boutwell, Jazzma H. ([id number]) -------------------------------------------------------------------------------- Activities of Daily Living Details Patient Name: [name], [name] Date of Service: 09/10/2019 9:45 AM Medical Record Number: [id number] Patient Account Number: [id number] Date of Birth/Sex: 10-12-1927 (86 y.o. F) Treating RN: Montey Hora Primary Care Han Vejar: Park Liter Other Clinician: Referring Cloee Dunwoody: Park Liter Treating Elveta Rape/Extender: [name], [name] Weeks in Treatment: 0 Activities of Daily Living Items Answer Activities of Daily Living (Please select one for each item) Drive Automobile Completely Able Take Medications Completely Able Use Telephone Completely Able Care for Appearance Completely Able Use Toilet Completely Able Bath / Shower Completely Able Dress Self Completely Able Feed Self Completely Able Walk Completely Able Get In / Out Bed Completely Able Housework Completely Able Prepare Meals Completely [place] for Self Completely Able Electronic Signature(s) Signed: 09/10/2019 9:53:56 AM By:  Montey Hora Entered By: Montey Hora on 09/10/2019 09:53:55 Cubbage, Laryn Lemmie Evens ([id number]) -------------------------------------------------------------------------------- Education Screening Details Patient Name: [name], [name] Date of Service: 09/10/2019 9:45 AM Medical Record Number: [id number] Patient Account Number: [id number] Date of Birth/Sex: December 04, 1927 (86 y.o. F) Treating RN: Montey Hora Primary Care Catalea Labrecque: Park Liter Other Clinician: Referring Neilani Duffee: Park Liter Treating Kc Sedlak/Extender: [name], [name] Weeks in Treatment: 0 Primary Learner Assessed: Patient Learning Preferences/Education Level/Primary Language Learning Preference: Explanation, Demonstration Highest Education Level: High School Preferred Language: English Cognitive Barrier Language Barrier: No Translator Needed: No Memory Deficit: No Emotional Barrier: No Cultural/Religious Beliefs Affecting Medical Care: No Physical Barrier Impaired Vision: No Impaired Hearing: No Decreased Hand dexterity: No Knowledge/Comprehension Knowledge Level: Medium Comprehension Level: Medium Ability to understand written Medium instructions: Ability to understand verbal Medium instructions: Motivation Anxiety Level: Calm Cooperation: Cooperative Education Importance: Acknowledges Need Interest in Health Problems: Asks Questions Perception: Coherent Willingness to Engage in Self- Medium Management Activities: Readiness to Engage in Self- Medium Management Activities: Electronic Signature(s) Signed: 09/10/2019 4:40:20 PM By: Montey Hora Entered By: Montey Hora on 09/10/2019 09:59:44 Clucas, [name] ([id number]) -------------------------------------------------------------------------------- Fall Risk Assessment Details Patient Name: [name], [name] Date of Service: 09/10/2019 9:45 AM Medical Record Number: [id number] Patient Account Number: [id number] Date of  Birth/Sex: [birth year] (86 y.o. F) Treating RN: Montey Hora Primary Care Shyquan Stallbaumer: Park Liter Other Clinician: Referring Casie Sturgeon: Park Liter Treating Dustine Bertini/Extender: [name], [name] Weeks in Treatment: 0 Fall Risk Assessment Items Have you had 2 or more falls in the last 12 monthso 0 No Have you had any fall that resulted in injury in the last 12 monthso 0 No FALLS RISK SCREEN History of falling - immediate or within 3 months 0 No Secondary diagnosis (Do you have 2 or more medical diagnoseso) 0 No Ambulatory aid None/bed rest/wheelchair/nurse 0 Yes Crutches/cane/walker 0 No Furniture 0 No Intravenous therapy Access/Saline/Heparin Lock 0 No Gait/Transferring Normal/ bed rest/ wheelchair 0 No Weak (  short steps with or without shuffle, stooped but able to lift head while 10 Yes walking, may seek support from furniture) Impaired (short steps with shuffle, may have difficulty arising from chair, head 0 No down, impaired balance) Mental Status Oriented to own ability 0 Yes Electronic Signature(s) Signed: 09/10/2019 4:40:20 PM By: Montey Hora Entered By: Montey Hora on 09/10/2019 09:58:57 Soley, Yatzari Lemmie Evens ([id number]) -------------------------------------------------------------------------------- Foot Assessment Details Patient Name: [name], [name] Date of Service: 09/10/2019 9:45 AM Medical Record Number: [id number] Patient Account Number: [id number] Date of Birth/Sex: 11-Feb-1928 (86 y.o. F) Treating RN: Montey Hora Primary Care Khyleigh Furney: Park Liter Other Clinician: Referring Jania Steinke: Park Liter Treating Elyshia Kumagai/Extender: [name], [name] Weeks in Treatment: 0 Foot Assessment Items Site Locations + = Sensation present, - = Sensation absent, C = Callus, U = Ulcer R = Redness, W = Warmth, M = Maceration, PU = Pre-ulcerative lesion F = Fissure, S = Swelling, D = Dryness Assessment Right: Left: Other Deformity: No No Prior Foot Ulcer: No  No Prior Amputation: No No Charcot Joint: No No Ambulatory Status: Ambulatory Without Help Gait: Steady Electronic Signature(s) Signed: 09/10/2019 4:40:20 PM By: Montey Hora Entered By: Montey Hora on 09/10/2019 09:59:12 Muscarella, Wh[place]. ([id number]) -------------------------------------------------------------------------------- Nutrition Risk Screening Details Patient Name: Lia, Jalisia H. Date of Service: 09/10/2019 9:45 AM Medical Record Number: [id number] Patient Account Number: [id number] Date of Birth/Sex: 01/27/1928 (86 y.o. F) Treating RN: Montey Hora Primary Care Londin Antone: Park Liter Other Clinician: Referring Mayrani Khamis: Park Liter Treating Neri Vieyra/Extender: [name], [name] Weeks in Treatment: 0 Height (in): 60 Weight (lbs): 96 Body Mass Index (BMI): 18.7 Nutrition Risk Screening Items Score Screening NUTRITION RISK SCREEN: I have an illness or condition that made me change the kind and/or amount of 0 No food I eat I eat fewer than two meals per day 0 No I eat few fruits and vegetables, or milk products 0 No I have three or more drinks of beer, liquor or wine almost every day 0 No I have tooth or mouth problems that make it hard for me to eat 0 No I don't always have enough money to buy the food I need 0 No I eat alone most of the time 0 No I take three or more different prescribed or over-the-counter drugs a day 1 Yes Without wanting to, I have lost or gained 10 pounds in the last six months 0 No I am not always physically able to shop, cook and/or feed myself 0 No Nutrition Protocols Good Risk Protocol 0 No interventions needed Moderate Risk Protocol High Risk Proctocol Risk Level: Good Risk Score: 1 Electronic Signature(s) Signed: 09/10/2019 4:40:20 PM By: Montey Hora Entered By: Montey Hora on 09/10/2019 09:59:05

## 2019-09-11 ENCOUNTER — Telehealth: Payer: Self-pay

## 2019-09-11 NOTE — Progress Notes (Signed)
ARLENIS, HOUGHAM ([id number]) Visit Report for 09/10/2019 Chief Complaint Document Details Patient Name: [name], [name]. Date of Service: 09/10/2019 9:45 AM Medical Record Number: [id number] Patient Account Number: [id number] Date of Birth/Sex: November 25, 1927 (86 y.o. F) Treating RN: [name] Primary Care Provider: Park Liter Other Clinician: Referring Provider: Park Liter Treating Provider/Extender: [name], [name] Weeks in Treatment: 0 Information Obtained from: Patient Chief Complaint Right LE skin tear Electronic Signature(s) Signed: 09/10/2019 10:23:19 AM By: Worthy Keeler PA-C Entered By: Worthy Keeler on 09/10/2019 10:23:18 TABBIE, MARC ([id number]) -------------------------------------------------------------------------------- Debridement Details Patient Name: [name] Date of Service: 09/10/2019 9:45 AM Medical Record Number: [id number] Patient Account Number: [id number] Date of Birth/Sex: July 14, 1928 (86 y.o. F) Treating RN: [name] Primary Care Provider: Park Liter Other Clinician: Referring Provider: Park Liter Treating Provider/Extender: [name], [name] Weeks in Treatment: 0 Debridement Performed for Wound #18 Right,Anterior Lower Leg Assessment: Performed By: Physician STONE III, [name] E., PA-C Debridement Type: Debridement Level of Consciousness (Pre- Awake and Alert procedure): Pre-procedure Verification/Time Yes - 10:26 Out Taken: Start Time: 10:27 Total Area Debrided (L x W): 2.7 (cm) x 2.8 (cm) = 7.56 (cm) Tissue and other material Viable, Non-Viable, Skin: Dermis debrided: Level: Skin/Dermis Debridement Description: Selective/Open Wound Instrument: Forceps, Scissors Bleeding: Minimum Hemostasis Achieved: Pressure End Time: 10:27 Response to Treatment: Procedure was tolerated well Level of Consciousness Awake and Alert (Post-procedure): Post Debridement Measurements of Total Wound Length: (cm)  2.7 Width: (cm) 2.8 Depth: (cm) 0.1 Volume: (cm) 0.594 Character of Wound/Ulcer Post Debridement: Stable Post Procedure Diagnosis Same as Pre-procedure Electronic Signature(s) Signed: 09/10/2019 11:26:15 AM By: [name] Signed: 09/11/2019 9:46:35 AM By: Worthy Keeler PA-C Entered By: [name] on 09/10/2019 10:28:08 [name] ([id number]) -------------------------------------------------------------------------------- HPI Details Patient Name: [name] Date of Service: 09/10/2019 9:45 AM Medical Record Number: [id number] Patient Account Number: [id number] Date of Birth/Sex: [birth year] (86 y.o. F) Treating RN: [name] Primary Care Provider: Park Liter Other Clinician: Referring Provider: Park Liter Treating Provider/Extender: [name], [name] Weeks in Treatment: 0 History of Present Illness HPI Description: 02/12/18 ADMISSION This is an 86 year old woman who is recently moved to [place] from [place]. Her story began in February where she fell on some ice suffering extensive lacerations to her bilateral lower extremities. She is able to show me extensive pictures of the right lower leg but with going to a wound care center and [place] 3 times a week these eventually closed over. She has been left with 1 area on the upper lateral left calf. She has been applying Neosporin to this and applying a bandage. Currently this measures 2 x 1.5 cm. The patient is not a diabetic. She is an ex-smoker quitting 40 years ago. She does have COPD by description. ABIs in our clinic were 1.32 on the right and 1.03 on the left 02/19/18; right lower leg wound which was initially trauma. The area that we look that last week is smaller. Still covered in a nonviable surface however. We are using Iodoflex 02/26/18; right lower leg wound which was initially trauma in the setting of chronic venous insufficiency. Surface of the wound looks much better healthy granulation  advancing epithelialization. We have good edema control 03/05/18; right lower leg wound which was initially trauma in the setting of chronic venous insufficiency. She continues to make nice progress here. We have good edema control oHe arrives today with a new traumatic laceration on her right dorsal forearm which she states happened while she  was putting on a sweatshirt 03/12/18; right lower leg wound as closed however it still looks vulnerable. She has chronic venous insufficiency. oThe new wound from last week a traumatic area on her right dorsal or arm unfortunately does not have a viable surface. I remove some nonviable skin and necrotic subcutaneous debris from the wound surface. Hemostasis with silver nitrate and direct pressure 03/19/18; right lower leg wound is closed. She has chronic venous insufficiency and will need ongoing compression oThe new wounds from 2 weeks ago on the right upper elbow is closed. The area on her right dorsal forearm continues to have a nonviable surface requiring debridement 03/26/18; we'll need to look into ongoing compression for her legs. 2 wounds on the right upper elbow is closed. The area on the right dorsal forearm looks better. Hydrofera Blue secondary to hypertrophic granulation 04/02/18; he has compression stockings although she did not wear them today. Severe chronic venous insufficiency. She has 2 wounds on the right upper elbow which I said were closed last week which actually are although they're very small and she has a smaller clean wound on the right dorsal forearm. We've been using Hydrofera Blue secondary to some upper granulation. All of this looks better 04/09/18-She is seen in follow up evaluation for a right elbow and right forearm skin tear. The right elbow is healed. We will continue with same treatment plan and she'll follow next week 04/16/18-She is seen in follow-up evaluation for right forearm skin tear, this is essentially healed. We will  cover with foam border and she will follow-up next week 04/23/18; the patient arrives with a new skin tear on her right dorsal arm just below the elbow. She got this while carrying a box. She has a linear skin tear. There is no depth I don't think this should've been sutured. There is a skin flap medially I'm not sure if this will be viable or not 04/30/18; the new skin tear from last week unfortunately has remained viable in terms of the skin flap. She has a small open area that looks healthy. Using moistened silver collagen 05/07/18; right dorsal arm skin tear. Nonviable tissue over the remnants of the wound. We've been using silver collagen 05/21/18; right dorsal arm skin tear. Nonviable tissue over a small remnant of the wound was washed off. We've been using silver collagen which we will continue 06/04/18; the right dorsal arm skin tear has healed. She arrives today with a traumatic wound just above the olecranon of her left elbow. This is a skin tear with a nonviable nonadherent flap which was removed. We'll use silver collagen here. Also noteworthy she arrives without her compression stockings 06/18/18; the area just above the olecranon of her left elbow. It is smaller and generally has a healthy surface. I was surprised EMRY, MICHELENA. ([id number]) to learn that the patient is actually changing this herself although. Appears this week she was putting some topical lidocaine on this that she bought over-the-counter at the drugstore. She did get supplies at home she didn't know how to put it on. She drove herself here today i.e. not eligible for home health Readmission: 11/04/18 on evaluation today patient presents for evaluation our clinic news to include the she has on her lower extremity which occurred as the result of having hit this on a piece of furniture. This was back in December. She states that her daughter has been trying to get her to come into have this evaluated here the wound  care  center but she was being somewhat stubborn. Nonetheless upon evaluation today the wound really appears to be potentially healed she does have some swelling of lower extremity which I think will come into play. With that being said he tells me that even yesterday this was still making clear fluid and therefore there may be some issues with continued problems with the swelling and drainage secondary to her venous stasis. Nonetheless overall wound appears to be doing fairly well which is good news. 11/11/18 on evaluation today patient actually appears to be doing excellent in the area of question that was green just the day before he saw her last time actually appears to have not drain that always in past week. I think this is done very well and at this point though she's had some discomfort I do not see any evidence of open wound which he needs to continue see wound care. Readmission: 03/10/19 on evaluation today patient presents for reevaluation here in our clinic concerning issues that she actually is having with the new problem on her right lower leg due to trauma that occurred when a car door shut on her leg last Thursday. This is not been quite a week and she does have several skin tears unfortunately that resulted from this injury. Fortunately there's no signs of active infection at this time. No fevers, chills, nausea, or vomiting noted at this time. She does have some hot tissue noted at this point that has me concerned about the possibility of needing to move this in order to allow the areas to heal appropriately. Fortunately there's no signs of active infection at this time. 03/17/19-Patient returns to clinic at 1 week, we have been using Prisma on the larger wounds with Kerlix and Xeroform. As many as 5 wounds were number last time 3 of which have closed, leaving the right proximal tibial and right distal medial leg wound both of which are looking good 03/24/19 on evaluation today patient  appears to be doing better in regard to her wounds at this time. Fortunately there is no sign of active infection at this time. No fevers chills noted. She's been tolerating the dressing changes without complication which is good news she's been performing these herself it sounds like home health is not going to be coming out it sounds as if they feel like she may be able to take care of yourself and not be technically homebound. Nonetheless though that is unfortunate it does seem like she's done fairly well with taking care of yourself from the standpoint of the dressings. 03/31/19 on evaluation today patient actually appears to be doing well with regard to her lower extremity ulcers. She had one area to heal today and in regard to her right lower extremity were she continues to have an open wound I did have to perform some sharp debridement today. Subsequently the patient still has an area of what appears to be possibly a small hematoma on the anterior portion of her left lower extremity. This is not open enough at this time but nonetheless does seem to show some evidence of hardening which is not necessarily a bad thing. Eventually this may pop off but again I'm not interested in really removing this prematurely. 04/07/19 on evaluation today patient actually appears to be doing well with regard to her lower extremity ulcers. She is been tolerating the dressing changes without complication. She seems to be making excellent progress which is great news. She does have one small new skin tear  area but the good news is she was able to pull the skin back over and it really looks like it is healing quite nicely. Overall that is just a very small region and again seems to be healing nicely. 04/14/19 upon evaluation today patient actually appears to be doing quite well with regard to the wounds on the right lower extremity. In fact everything appears to be healed as of today. This is excellent news. She does  however have the area on the left lower extremity which is showing some signs of loosening up we have been watching this area it is no longer fluctuant and in fact with the Betadine on it it is been slowly dry now but is now lifting up on the edge. There may be a wound underneath this region. 04/21/2019 on evaluation today patient appears to be doing excellent in regard to her bilateral lower extremities. In fact she has been tolerating the dressing changes without complication does not seem to show any signs of infection at this point. In fact based on what I am seeing currently I think she is completely healed. MECHELE, LOVINGER ([id number]) 05/04/2019 on evaluation today patient actually appears to be doing quite well with regard to her right elbow. She has been tolerating the dressing changes without complication. Fortunately there is no signs of active infection at this time. Overall I am very pleased with how things are progressing. The elbow almost appears to be completely healed though it is not 100% today. 05/14/2019 on evaluation today patient actually appears to be doing well with regard to her elbow skin tear that we have been treating in fact this appears to be completely healed. Unfortunately she has a new skin tear noted as of today which actually occurred in the last week since I previously saw her. This again is unfortunate that it has been so long because I am not going to be able to actually pull the skin over where you have to trim this away and let it heal by second intent. This new wound is in the right elbow region as well but again separate from the original ulcer. 05/19/2019 on evaluation today patient appears to be doing well with regard to her skin tear on her elbow. She has been tolerating the dressing changes without complication. Fortunately there is no signs of active infection at this time. No fever chills noted. Overall I am very pleased with how things appear today.  She is not having any significant pain which is also excellent news. 05/26/2019 on evaluation today patient actually appears to be doing well with regard to her right elbow ulcer. She has been tolerating the dressing changes without complication. Fortunately there is no signs of active infection at this time. No fevers, chills, nausea, vomiting, or diarrhea. 06/02/2019 on evaluation today patient appears to be doing very well with regard to her wound of the right elbow. In fact there is just a very small opening still remaining for the most part this is completely closed and appears to be doing excellent. There is no signs of active infection at this time which is also good news. No fevers, chills, nausea, vomiting, or diarrhea. 06/09/2019 on evaluation today patient appears to actually be doing excellent with regard to her original wound on her elbow which really appears to be completely healed at this point. With that being said she is having issues with an area generally of weeping in this region which I am unsure exactly what is  causing this. Does not appear to be obviously bacterially infected although that could be part of the issue. With that being said I do believe that there is also a chance there could be a fungal infection here. 06/18/2019 on evaluation today patient's elbow ulcer appears to be doing much better I do believe this was more likely a fungal infection that was going on at the last visit and fortunately she seems to be doing much better at this point. With that being said this is not completely healed but I am hopeful will be so by next week. She is very pleased that is really not having any pain in regard to the elbow. 06/25/2019 upon evaluation today patient appears to be doing well with regard to her elbow ulcer that we have been taking care of unfortunately she has a new skin tear on her forearm which has arisen since I last saw her. This she states resulted from a fall  that she had tried to get out of her bathtub on Tuesday. Obviously this is not good news she continues to have significant falls which I am very concerned about to be perfectly honest. I explained to her that if she is falling she runs the risk of injuring her head or potentially breaking something that could be more significant which she states she understands but she just "tries not to think about it". I explained to her that I feel like this is something that she absolutely needs to think about in order to protect herself.-Suggested she may want to consider an assisted living facility she is in an apartment complex where she does have friends she really does not want to move out I therefore told her that at minimum I would recommend that she get in touch with her daughter and attempt if at all possible to have her contact her each day which she states they typically do anyway and if she is unable to get in touch with her that she subsequently call someone to check on her mother and again if they are not able to then have a way to be able to get into her apartment to check on her in case something untoward happens. 07/09/2019 on evaluation today patient actually appears to be doing excellent. She has been tolerating the dressing changes and her wound appears to be completely healed which is great news she has no new skin tears at this point. Readmission: 09/10/2019 patient presents today for readmission in our clinic due to a skin tear that she has on the right lower extremity which actually occurred 2 days ago. She was actually seen last on July 09, 2019 when she had completely healed from her prior skin tear on her arm. Fortunately there is no signs of active infection at this time. No fever chills noted. With that being said the patient tells me that she is not having any severe pain she did try to pull this back over as much as she could. This injury occurred as a result of hitting her leg on  the car door. No fevers, chills, nausea, vomiting, or diarrhea. NASHA, SERVANTEZ ([id number]) Electronic Signature(s) Signed: 09/10/2019 10:34:36 AM By: Worthy Keeler PA-C Entered By: Worthy Keeler on 09/10/2019 10:34:35 YAKIRA, HANNUM ([id number]) -------------------------------------------------------------------------------- Physical Exam Details Patient Name: Kraner, Maria H. Date of Service: 09/10/2019 9:45 AM Medical Record Number: [id number] Patient Account Number: [id number] Date of Birth/Sex: 28-Mar-1928 (86 y.o. F) Treating RN: [name] Primary Care Provider: Park Liter  Other Clinician: Referring Provider: Park Liter Treating Provider/Extender: STONE III, [name] Weeks in Treatment: 0 Constitutional patient is hypertensive.. pulse regular and within target range for patient.Marland Kitchen respirations regular, non-labored and within target range for patient.Marland Kitchen temperature within target range for patient.. Well-nourished and well-hydrated in no acute distress. Eyes conjunctiva clear no eyelid edema noted. pupils equal round and reactive to light and accommodation. Respiratory normal breathing without difficulty. clear to auscultation bilaterally. Cardiovascular regular rate and rhythm with normal S1, S2. 2+ dorsalis pedis/posterior tibialis pulses. no clubbing, cyanosis, significant edema, <3 sec cap refill. Gastrointestinal (GI) soft, non-tender, non-distended, +BS. no ventral hernia noted. Musculoskeletal stooped posture. no significant deformity or arthritic changes, no loss or range of motion, no clubbing. Psychiatric this patient is able to make decisions and demonstrates good insight into disease process. Alert and Oriented x 3. pleasant and cooperative. Notes Upon inspection today patient's wound bed actually showed signs of good granulation at this time around the edges where the skin flap is not covering. Unfortunately a little bit of the skin flap is still  rolled under on itself and I think is good to need to be trimmed off to allow this to heal in general. With that being said I am hopeful that the majority of the skin flap will actually survive and will not be anything that has to be removed at future visits. That will obviously speed up her healing process if that is the case. Electronic Signature(s) Signed: 09/10/2019 10:35:27 AM By: Worthy Keeler PA-C Entered By: Worthy Keeler on 09/10/2019 10:35:26 CALLYN, KRICK ([id number]) -------------------------------------------------------------------------------- Physician Orders Details Patient Name: [name] Date of Service: 09/10/2019 9:45 AM Medical Record Number: [id number] Patient Account Number: [id number] Date of Birth/Sex: 06/16/1928 (86 y.o. F) Treating RN: [name] Primary Care Provider: Park Liter Other Clinician: Referring Provider: Park Liter Treating Provider/Extender: [name], [name] Weeks in Treatment: 0 Verbal / Phone Orders: No Diagnosis Coding ICD-10 Coding Code Description I87.2 Venous insufficiency (chronic) (peripheral) S81.801A Unspecified open wound, right lower leg, initial encounter Wound Cleansing Wound #18 Right,Anterior Lower Leg o Clean wound with Normal Saline. Primary Wound Dressing Wound #18 Right,Anterior Lower Leg o Xeroform Secondary Dressing Wound #18 Right,Anterior Lower Leg o Boardered Foam Dressing Dressing Change Frequency Wound #18 Right,Anterior Lower Leg o Change dressing every other day. Electronic Signature(s) Signed: 09/10/2019 11:26:15 AM By: [name] Signed: 09/11/2019 9:46:35 AM By: Worthy Keeler PA-C Entered By: [name] on 09/10/2019 10:30:54 KAREL, GOULET ([id number]) -------------------------------------------------------------------------------- Problem List Details Patient Name: Bayless, Amaiya H. Date of Service: 09/10/2019 9:45 AM Medical Record Number:  [id number] Patient Account Number: [id number] Date of Birth/Sex: 1927/10/12 (86 y.o. F) Treating RN: [name] Primary Care Provider: Park Liter Other Clinician: Referring Provider: Park Liter Treating Provider/Extender: [name], [name] Weeks in Treatment: 0 Active Problems ICD-10 Evaluated Encounter Code Description Active Date Today Diagnosis I87.2 Venous insufficiency (chronic) (peripheral) 09/10/2019 No Yes S81.801A Unspecified open wound, right lower leg, initial encounter 09/10/2019 No Yes Inactive Problems Resolved Problems Electronic Signature(s) Signed: 09/10/2019 10:23:03 AM By: Worthy Keeler PA-C Entered By: Worthy Keeler on 09/10/2019 10:23:03 Sanguinetti, [name] ([id number]) -------------------------------------------------------------------------------- Progress Note Details Patient Name: Wynia, Myleah H. Date of Service: 09/10/2019 9:45 AM Medical Record Number: [id number] Patient Account Number: [id number] Date of Birth/Sex: October 06, 1927 (86 y.o. F) Treating RN: [name] Primary Care Provider: Park Liter Other Clinician: Referring Provider: Park Liter Treating Provider/Extender: [name], [name] Weeks in Treatment: 0 Subjective Chief Complaint Information obtained  from Patient Right LE skin tear History of Present Illness (HPI) 02/12/18 ADMISSION This is an 86 year old woman who is recently moved to [employer] from [place]. Her story began in February where she fell on some ice suffering extensive lacerations to her bilateral lower extremities. She is able to show me extensive pictures of the right lower leg but with going to a wound care center and [place] 3 times a week these eventually closed over. She has been left with 1 area on the upper lateral left calf. She has been applying Neosporin to this and applying a bandage. Currently this measures 2 x 1.5 cm. The patient is not a diabetic. She is an ex-smoker quitting 40 years ago. She does  have COPD by description. ABIs in our clinic were 1.32 on the right and 1.03 on the left 02/19/18; right lower leg wound which was initially trauma. The area that we look that last week is smaller. Still covered in a nonviable surface however. We are using Iodoflex 02/26/18; right lower leg wound which was initially trauma in the setting of chronic venous insufficiency. Surface of the wound looks much better healthy granulation advancing epithelialization. We have good edema control 03/05/18; right lower leg wound which was initially trauma in the setting of chronic venous insufficiency. She continues to make nice progress here. We have good edema control He arrives today with a new traumatic laceration on her right dorsal forearm which she states happened while she was putting on a sweatshirt 03/12/18; right lower leg wound as closed however it still looks vulnerable. She has chronic venous insufficiency. The new wound from last week a traumatic area on her right dorsal or arm unfortunately does not have a viable surface. I remove some nonviable skin and necrotic subcutaneous debris from the wound surface. Hemostasis with silver nitrate and direct pressure 03/19/18; right lower leg wound is closed. She has chronic venous insufficiency and will need ongoing compression The new wounds from 2 weeks ago on the right upper elbow is closed. The area on her right dorsal forearm continues to have a nonviable surface requiring debridement 03/26/18; we'll need to look into ongoing compression for her legs. 2 wounds on the right upper elbow is closed. The area on the right dorsal forearm looks better. Hydrofera Blue secondary to hypertrophic granulation 04/02/18; he has compression stockings although she did not wear them today. Severe chronic venous insufficiency. She has 2 wounds on the right upper elbow which I said were closed last week which actually are although they're very small and she has a smaller  clean wound on the right dorsal forearm. We've been using Hydrofera Blue secondary to some upper granulation. All of this looks better 04/09/18-She is seen in follow up evaluation for a right elbow and right forearm skin tear. The right elbow is healed. We will continue with same treatment plan and she'll follow next week 04/16/18-She is seen in follow-up evaluation for right forearm skin tear, this is essentially healed. We will cover with foam border and she will follow-up next week 04/23/18; the patient arrives with a new skin tear on her right dorsal arm just below the elbow. She got this while carrying a box. She has a linear skin tear. There is no depth I don't think this should've been sutured. There is a skin flap medially I'm not sure if this will be viable or not 04/30/18; the new skin tear from last week unfortunately has remained viable in terms of the skin flap.  She has a small open area that looks healthy. Using moistened silver collagen 05/07/18; right dorsal arm skin tear. Nonviable tissue over the remnants of the wound. We've been using silver collagen DINIA, ZIMMERLE ([id number]) 05/21/18; right dorsal arm skin tear. Nonviable tissue over a small remnant of the wound was washed off. We've been using silver collagen which we will continue 06/04/18; the right dorsal arm skin tear has healed. She arrives today with a traumatic wound just above the olecranon of her left elbow. This is a skin tear with a nonviable nonadherent flap which was removed. We'll use silver collagen here. Also noteworthy she arrives without her compression stockings 06/18/18; the area just above the olecranon of her left elbow. It is smaller and generally has a healthy surface. I was surprised to learn that the patient is actually changing this herself although. Appears this week she was putting some topical lidocaine on this that she bought over-the-counter at the drugstore. She did get supplies at home she  didn't know how to put it on. She drove herself here today i.e. not eligible for home health Readmission: 11/04/18 on evaluation today patient presents for evaluation our clinic news to include the she has on her lower extremity which occurred as the result of having hit this on a piece of furniture. This was back in December. She states that her daughter has been trying to get her to come into have this evaluated here the wound care center but she was being somewhat stubborn. Nonetheless upon evaluation today the wound really appears to be potentially healed she does have some swelling of lower extremity which I think will come into play. With that being said he tells me that even yesterday this was still making clear fluid and therefore there may be some issues with continued problems with the swelling and drainage secondary to her venous stasis. Nonetheless overall wound appears to be doing fairly well which is good news. 11/11/18 on evaluation today patient actually appears to be doing excellent in the area of question that was green just the day before he saw her last time actually appears to have not drain that always in past week. I think this is done very well and at this point though she's had some discomfort I do not see any evidence of open wound which he needs to continue see wound care. Readmission: 03/10/19 on evaluation today patient presents for reevaluation here in our clinic concerning issues that she actually is having with the new problem on her right lower leg due to trauma that occurred when a car door shut on her leg last Thursday. This is not been quite a week and she does have several skin tears unfortunately that resulted from this injury. Fortunately there's no signs of active infection at this time. No fevers, chills, nausea, or vomiting noted at this time. She does have some hot tissue noted at this point that has me concerned about the possibility of needing to move this  in order to allow the areas to heal appropriately. Fortunately there's no signs of active infection at this time. 03/17/19-Patient returns to clinic at 1 week, we have been using Prisma on the larger wounds with Kerlix and Xeroform. As many as 5 wounds were number last time 3 of which have closed, leaving the right proximal tibial and right distal medial leg wound both of which are looking good 03/24/19 on evaluation today patient appears to be doing better in regard to her wounds at  this time. Fortunately there is no sign of active infection at this time. No fevers chills noted. She's been tolerating the dressing changes without complication which is good news she's been performing these herself it sounds like home health is not going to be coming out it sounds as if they feel like she may be able to take care of yourself and not be technically homebound. Nonetheless though that is unfortunate it does seem like she's done fairly well with taking care of yourself from the standpoint of the dressings. 03/31/19 on evaluation today patient actually appears to be doing well with regard to her lower extremity ulcers. She had one area to heal today and in regard to her right lower extremity were she continues to have an open wound I did have to perform some sharp debridement today. Subsequently the patient still has an area of what appears to be possibly a small hematoma on the anterior portion of her left lower extremity. This is not open enough at this time but nonetheless does seem to show some evidence of hardening which is not necessarily a bad thing. Eventually this may pop off but again I'm not interested in really removing this prematurely. 04/07/19 on evaluation today patient actually appears to be doing well with regard to her lower extremity ulcers. She is been tolerating the dressing changes without complication. She seems to be making excellent progress which is great news. She does have one  small new skin tear area but the good news is she was able to pull the skin back over and it really looks like it is healing quite nicely. Overall that is just a very small region and again seems to be healing nicely. 04/14/19 upon evaluation today patient actually appears to be doing quite well with regard to the wounds on the right lower extremity. In fact everything appears to be healed as of today. This is excellent news. She does however have the area on the left lower extremity which is showing some signs of loosening up we have been watching this area it is no longer fluctuant and in fact with the Betadine on it it is been slowly dry now but is now lifting up on the edge. There may be a wound underneath Capriotti, Plaquemine ([id number]) this region. 04/21/2019 on evaluation today patient appears to be doing excellent in regard to her bilateral lower extremities. In fact she has been tolerating the dressing changes without complication does not seem to show any signs of infection at this point. In fact based on what I am seeing currently I think she is completely healed. 05/04/2019 on evaluation today patient actually appears to be doing quite well with regard to her right elbow. She has been tolerating the dressing changes without complication. Fortunately there is no signs of active infection at this time. Overall I am very pleased with how things are progressing. The elbow almost appears to be completely healed though it is not 100% today. 05/14/2019 on evaluation today patient actually appears to be doing well with regard to her elbow skin tear that we have been treating in fact this appears to be completely healed. Unfortunately she has a new skin tear noted as of today which actually occurred in the last week since I previously saw her. This again is unfortunate that it has been so long because I am not going to be able to actually pull the skin over where you have to trim this away and let  it heal  by second intent. This new wound is in the right elbow region as well but again separate from the original ulcer. 05/19/2019 on evaluation today patient appears to be doing well with regard to her skin tear on her elbow. She has been tolerating the dressing changes without complication. Fortunately there is no signs of active infection at this time. No fever chills noted. Overall I am very pleased with how things appear today. She is not having any significant pain which is also excellent news. 05/26/2019 on evaluation today patient actually appears to be doing well with regard to her right elbow ulcer. She has been tolerating the dressing changes without complication. Fortunately there is no signs of active infection at this time. No fevers, chills, nausea, vomiting, or diarrhea. 06/02/2019 on evaluation today patient appears to be doing very well with regard to her wound of the right elbow. In fact there is just a very small opening still remaining for the most part this is completely closed and appears to be doing excellent. There is no signs of active infection at this time which is also good news. No fevers, chills, nausea, vomiting, or diarrhea. 06/09/2019 on evaluation today patient appears to actually be doing excellent with regard to her original wound on her elbow which really appears to be completely healed at this point. With that being said she is having issues with an area generally of weeping in this region which I am unsure exactly what is causing this. Does not appear to be obviously bacterially infected although that could be part of the issue. With that being said I do believe that there is also a chance there could be a fungal infection here. 06/18/2019 on evaluation today patient's elbow ulcer appears to be doing much better I do believe this was more likely a fungal infection that was going on at the last visit and fortunately she seems to be doing much better at this  point. With that being said this is not completely healed but I am hopeful will be so by next week. She is very pleased that is really not having any pain in regard to the elbow. 06/25/2019 upon evaluation today patient appears to be doing well with regard to her elbow ulcer that we have been taking care of unfortunately she has a new skin tear on her forearm which has arisen since I last saw her. This she states resulted from a fall that she had tried to get out of her bathtub on Tuesday. Obviously this is not good news she continues to have significant falls which I am very concerned about to be perfectly honest. I explained to her that if she is falling she runs the risk of injuring her head or potentially breaking something that could be more significant which she states she understands but she just "tries not to think about it". I explained to her that I feel like this is something that she absolutely needs to think about in order to protect herself.-Suggested she may want to consider an assisted living facility she is in an apartment complex where she does have friends she really does not want to move out I therefore told her that at minimum I would recommend that she get in touch with her daughter and attempt if at all possible to have her contact her each day which she states they typically do anyway and if she is unable to get in touch with her that she subsequently call someone to check on her  mother and again if they are not able to then have a way to be able to get into her apartment to check on her in case something untoward happens. 07/09/2019 on evaluation today patient actually appears to be doing excellent. She has been tolerating the dressing changes and her wound appears to be completely healed which is great news she has no new skin tears at this point. Readmission: 09/10/2019 patient presents today for readmission in our clinic due to a skin tear that she has on the right lower  extremity which Payette, [place]. ([id number]) actually occurred 2 days ago. She was actually seen last on July 09, 2019 when she had completely healed from her prior skin tear on her arm. Fortunately there is no signs of active infection at this time. No fever chills noted. With that being said the patient tells me that she is not having any severe pain she did try to pull this back over as much as she could. This injury occurred as a result of hitting her leg on the car door. No fevers, chills, nausea, vomiting, or diarrhea. Patient History Information obtained from Patient. Allergies No Known Allergies Family History Cancer - Siblings,Paternal Grandparents, Diabetes - Siblings, Heart Disease - Mother,Father,Child,Paternal Grandparents, Hypertension - Child, No family history of Hereditary Spherocytosis, Kidney Disease, Lung Disease, Seizures, Stroke, Thyroid Problems, Tuberculosis. Social History Former smoker - quit 40 yrs ago, Marital Status - Divorced, Alcohol Use - Never, Drug Use - No History, Caffeine Use - Rarely. Medical History Eyes Patient has history of Cataracts - surgery 2000, Glaucoma Denies history of Optic Neuritis Respiratory Patient has history of Asthma, Chronic Obstructive Pulmonary Disease (COPD) Denies history of Aspiration, Pneumothorax, Sleep Apnea, Tuberculosis Cardiovascular Patient has history of Arrhythmia - a-fib, Hypertension Denies history of Angina, Congestive Heart Failure, Coronary Artery Disease, Deep Vein Thrombosis, Hypotension, Myocardial Infarction, Peripheral Arterial Disease, Peripheral Venous Disease, Phlebitis, Vasculitis Endocrine Denies history of Type I Diabetes, Type II Diabetes Genitourinary Denies history of End Stage Renal Disease Immunological Denies history of Lupus Erythematosus, Raynaud s, Scleroderma Integumentary (Skin) Denies history of History of Burn, History of pressure wounds Musculoskeletal Denies history of  Gout, Rheumatoid Arthritis, Osteoarthritis, Osteomyelitis Neurologic Denies history of Dementia, Neuropathy, Quadriplegia, Paraplegia, Seizure Disorder Oncologic Denies history of Received Chemotherapy, Received Radiation Psychiatric Denies history of Anorexia/bulimia, Confinement Anxiety Hospitalization/Surgery History - Colon cancer. Review of Systems (ROS) Eyes Denies complaints or symptoms of Dry Eyes, Vision Changes, Glasses / Contacts. Ear/Nose/Mouth/Throat Denies complaints or symptoms of Difficult clearing ears, Sinusitis. Hematologic/Lymphatic Denies complaints or symptoms of Bleeding / Clotting Disorders, Human Immunodeficiency Virus. Guyett, Iley H. ([id number]) Respiratory Denies complaints or symptoms of Chronic or frequent coughs, Shortness of Breath. Cardiovascular Denies complaints or symptoms of Chest pain, LE edema. Gastrointestinal Denies complaints or symptoms of Frequent diarrhea, Nausea, Vomiting. Endocrine Denies complaints or symptoms of Hepatitis, Thyroid disease, Polydypsia (Excessive Thirst). Genitourinary Denies complaints or symptoms of Kidney failure/ Dialysis, Incontinence/dribbling. Immunological Denies complaints or symptoms of Hives, Itching. Integumentary (Skin) Complains or has symptoms of Wounds. Denies complaints or symptoms of Bleeding or bruising tendency, Breakdown, Swelling. Musculoskeletal Denies complaints or symptoms of Muscle Pain, Muscle Weakness. Neurologic Denies complaints or symptoms of Numbness/parasthesias, Focal/Weakness. Psychiatric Denies complaints or symptoms of Anxiety, Claustrophobia. Objective Constitutional patient is hypertensive.. pulse regular and within target range for patient.Marland Kitchen respirations regular, non-labored and within target range for patient.Marland Kitchen temperature within target range for patient.. Well-nourished and well-hydrated in no acute distress. Vitals Time Taken: 9:54 AM, Height: 60  in, Source: Stated,  Weight: 96 lbs, Source: Measured, BMI: 18.7, Temperature: 97.8  F, Pulse: 70 bpm, Respiratory Rate: 16 breaths/min, Blood Pressure: 170/58 mmHg. Eyes conjunctiva clear no eyelid edema noted. pupils equal round and reactive to light and accommodation. Respiratory normal breathing without difficulty. clear to auscultation bilaterally. Cardiovascular regular rate and rhythm with normal S1, S2. 2+ dorsalis pedis/posterior tibialis pulses. no clubbing, cyanosis, significant edema, Gastrointestinal (GI) soft, non-tender, non-distended, +BS. no ventral hernia noted. Musculoskeletal stooped posture. no significant deformity or arthritic changes, no loss or range of motion, no clubbing. Psychiatric this patient is able to make decisions and demonstrates good insight into disease process. Alert and Oriented x 3. pleasant and cooperative. WALDA, MEEK ([id number]) General Notes: Upon inspection today patient's wound bed actually showed signs of good granulation at this time around the edges where the skin flap is not covering. Unfortunately a little bit of the skin flap is still rolled under on itself and I think is good to need to be trimmed off to allow this to heal in general. With that being said I am hopeful that the majority of the skin flap will actually survive and will not be anything that has to be removed at future visits. That will obviously speed up her healing process if that is the case. Integumentary (Hair, Skin) Wound #18 status is Open. Original cause of wound was Trauma. The wound is located on the Right,Anterior Lower Leg. The wound measures 2.7cm length x 2.8cm width x 0.1cm depth; 5.938cm^2 area and 0.594cm^3 volume. There is Fat Layer (Subcutaneous Tissue) Exposed exposed. There is no tunneling or undermining noted. There is a medium amount of sanguinous drainage noted. The wound margin is flat and intact. There is no granulation within the wound bed. There is a medium  (34-66%) amount of necrotic tissue within the wound bed including Adherent Slough. Assessment Active Problems ICD-10 Venous insufficiency (chronic) (peripheral) Unspecified open wound, right lower leg, initial encounter Procedures Wound #18 Pre-procedure diagnosis of Wound #18 is a Trauma, Other located on the Right,Anterior Lower Leg . There was a Selective/Open Wound Skin/Dermis Debridement with a total area of 7.56 sq cm performed by STONE III, [name] E., PA-C. With the following instrument(s): Forceps, and Scissors to remove Viable and Non-Viable tissue/material. Material removed includes Skin: Dermis. A time out was conducted at 10:26, prior to the start of the procedure. A Minimum amount of bleeding was controlled with Pressure. The procedure was tolerated well. Post Debridement Measurements: 2.7cm length x 2.8cm width x 0.1cm depth; 0.594cm^3 volume. Character of Wound/Ulcer Post Debridement is stable. Post procedure Diagnosis Wound #18: Same as Pre-Procedure Plan Wound Cleansing: Wound #18 Right,Anterior Lower Leg: Clean wound with Normal Saline. Primary Wound Dressing: Wound #18 Right,Anterior Lower Leg: Xeroform Secondary Dressing: Wound #18 Right,Anterior Lower Leg: Boardered Foam Dressing Levandowski, Tyrihanna H. ([id number]) Dressing Change Frequency: Wound #18 Right,Anterior Lower Leg: Change dressing every other day. 1. My suggestion currently is good to be that we go ahead and initiate treatment with a Xeroform gauze dressing. The patient is in agreement with that plan. 2. I am in a suggest as well that we go ahead and have her change this every other day. I think that the Xeroform will keep things from sticking in the border foam which she has a lot of from her prior visit here with [place] is also going to help protect the area much better. We will see patient back for reevaluation in 1 week here in the clinic.  If anything worsens or changes patient will contact our office  for additional recommendations. Electronic Signature(s) Signed: 09/10/2019 10:36:23 AM By: Worthy Keeler PA-C Entered By: Worthy Keeler on 09/10/2019 10:36:23 AIDA, HELSON ([id number]) -------------------------------------------------------------------------------- ROS/PFSH Details Patient Name: [name] Date of Service: 09/10/2019 9:45 AM Medical Record Number: [id number] Patient Account Number: [id number] Date of Birth/Sex: 08-25-1928 (86 y.o. F) Treating RN: Montey Hora Primary Care Provider: Park Liter Other Clinician: Referring Provider: Park Liter Treating Provider/Extender: [name], [name] Weeks in Treatment: 0 Information Obtained From Patient Eyes Complaints and Symptoms: Negative for: Dry Eyes; Vision Changes; Glasses / Contacts Medical History: Positive for: Cataracts - surgery 2000; Glaucoma Negative for: Optic Neuritis Ear/Nose/Mouth/Throat Complaints and Symptoms: Negative for: Difficult clearing ears; Sinusitis Hematologic/Lymphatic Complaints and Symptoms: Negative for: Bleeding / Clotting Disorders; Human Immunodeficiency Virus Respiratory Complaints and Symptoms: Negative for: Chronic or frequent coughs; Shortness of Breath Medical History: Positive for: Asthma; Chronic Obstructive Pulmonary Disease (COPD) Negative for: Aspiration; Pneumothorax; Sleep Apnea; Tuberculosis Cardiovascular Complaints and Symptoms: Negative for: Chest pain; LE edema Medical History: Positive for: Arrhythmia - a-fib; Hypertension Negative for: Angina; Congestive Heart Failure; Coronary Artery Disease; Deep Vein Thrombosis; Hypotension; Myocardial Infarction; Peripheral Arterial Disease; Peripheral Venous Disease; Phlebitis; Vasculitis Gastrointestinal Complaints and Symptoms: Negative for: Frequent diarrhea; Nausea; Vomiting Endocrine Complaints and Symptoms: Negative for: Hepatitis; Thyroid disease; Polydypsia (Excessive Thirst) Pinheiro,  Sondra H. ([id number]) Medical History: Negative for: Type I Diabetes; Type II Diabetes Genitourinary Complaints and Symptoms: Negative for: Kidney failure/ Dialysis; Incontinence/dribbling Medical History: Negative for: End Stage Renal Disease Immunological Complaints and Symptoms: Negative for: Hives; Itching Medical History: Negative for: Lupus Erythematosus; Raynaudos; Scleroderma Integumentary (Skin) Complaints and Symptoms: Positive for: Wounds Negative for: Bleeding or bruising tendency; Breakdown; Swelling Medical History: Negative for: History of Burn; History of pressure wounds Musculoskeletal Complaints and Symptoms: Negative for: Muscle Pain; Muscle Weakness Medical History: Negative for: Gout; Rheumatoid Arthritis; Osteoarthritis; Osteomyelitis Neurologic Complaints and Symptoms: Negative for: Numbness/parasthesias; Focal/Weakness Medical History: Negative for: Dementia; Neuropathy; Quadriplegia; Paraplegia; Seizure Disorder Psychiatric Complaints and Symptoms: Negative for: Anxiety; Claustrophobia Medical History: Negative for: Anorexia/bulimia; Confinement Anxiety Oncologic Medical History: Negative for: Received Chemotherapy; Received Radiation HBO Extended History Items AMAIRANY, RAYFORD. ([id number]) Eyes: Eyes: Cataracts Glaucoma Immunizations Pneumococcal Vaccine: Received Pneumococcal Vaccination: Yes Implantable Devices None Hospitalization / Surgery History Type of Hospitalization/Surgery Colon cancer Family and Social History Cancer: Yes - Siblings,Paternal Grandparents; Diabetes: Yes - Siblings; Heart Disease: Yes - Mother,Father,Child,Paternal Grandparents; Hereditary Spherocytosis: No; Hypertension: Yes - Child; Kidney Disease: No; Lung Disease: No; Seizures: No; Stroke: No; Thyroid Problems: No; Tuberculosis: No; Former smoker - quit 40 yrs ago; Marital Status - Divorced; Alcohol Use: Never; Drug Use: No History; Caffeine Use: Rarely;  Financial Concerns: No; Food, Clothing or Shelter Needs: No; Support System Lacking: No; Transportation Concerns: No Electronic Signature(s) Signed: 09/10/2019 4:40:20 PM By: Montey Hora Signed: 09/11/2019 9:46:35 AM By: Worthy Keeler PA-C Entered By: Montey Hora on 09/10/2019 09:53:25 Vanderveer, Vernella Lemmie Evens ([id number]) -------------------------------------------------------------------------------- SuperBill Details Patient Name: Sledd, Malon H. Date of Service: 09/10/2019 Medical Record Number: [id number] Patient Account Number: [id number] Date of Birth/Sex: [birth year] (86 y.o. F) Treating RN: [name] Primary Care Provider: Park Liter Other Clinician: Referring Provider: Park Liter Treating Provider/Extender: [name], [name] Weeks in Treatment: 0 Diagnosis Coding ICD-10 Codes Code Description I87.2 Venous insufficiency (chronic) (peripheral) S81.801A Unspecified open wound, right lower leg, initial encounter Facility Procedures CPT4 Code: [id number] Description: [id number] - WOUND CARE VISIT-LEV 3 EST PT Modifier: Quantity: 1  CPT4 Code: [id number] Description: [id number] - DEBRIDE WOUND 1ST 20 SQ CM OR < ICD-10 Diagnosis Description [id number] Unspecified open wound, right lower leg, initial encounter Modifier: Quantity: 1 Physician Procedures CPT4 Code: [id number] Description: [id number] - WC PHYS LEVEL 3 - EST PT ICD-10 Diagnosis Description I87.2 Venous insufficiency (chronic) (peripheral) S81.801A Unspecified open wound, right lower leg, initial encounter Modifier: 25 Quantity: 1 CPT4 Code: [id number] Description: [id number] - WC PHYS DEBR WO ANESTH 20 SQ CM ICD-10 Diagnosis Description S81.801A Unspecified open wound, right lower leg, initial encounter Modifier: Quantity: 1 Electronic Signature(s) Signed: 09/10/2019 10:36:59 AM By: Worthy Keeler PA-C Entered By: Worthy Keeler on 09/10/2019 10:36:58

## 2019-09-14 ENCOUNTER — Other Ambulatory Visit
Admission: RE | Admit: 2019-09-14 | Discharge: 2019-09-14 | Disposition: A | Discharge status: Home / Self Care | Payer: Medicare Other | Source: Ambulatory Visit | Attending: Surgery | Admitting: Surgery

## 2019-09-14 DIAGNOSIS — C183 Malignant neoplasm of hepatic flexure: Secondary | ICD-10-CM | POA: Insufficient documentation

## 2019-09-14 LAB — COMPREHENSIVE METABOLIC PANEL
ALT: 12 U/L (ref 0–44)
AST: 22 U/L (ref 15–41)
Albumin: 3.9 g/dL (ref 3.5–5.0)
Alkaline Phosphatase: 73 U/L (ref 38–126)
Anion gap: 10 (ref 5–15)
BUN: 23 mg/dL (ref 8–23)
CO2: 26 mmol/L (ref 22–32)
Calcium: 9.1 mg/dL (ref 8.9–10.3)
Chloride: 101 mmol/L (ref 98–111)
Creatinine, Ser: 1.34 mg/dL — ABNORMAL HIGH (ref 0.44–1.00)
GFR calc Af Amer: 41 mL/min — ABNORMAL LOW (ref >60)
GFR calc non Af Amer: 35 mL/min — ABNORMAL LOW (ref >60)
Glucose, Bld: 85 mg/dL (ref 70–99)
Potassium: 4.9 mmol/L (ref 3.5–5.1)
Sodium: 137 mmol/L (ref 135–145)
Total Bilirubin: 0.6 mg/dL (ref 0.3–1.2)
Total Protein: 7.1 g/dL (ref 6.5–8.1)

## 2019-09-14 LAB — CBC WITH DIFFERENTIAL/PLATELET
Abs Immature Granulocytes: 0.02 10*3/uL (ref 0.00–0.07)
Basophils Absolute: 0.1 10*3/uL (ref 0.0–0.1)
Basophils Relative: 1 %
Eosinophils Absolute: 0.3 10*3/uL (ref 0.0–0.5)
Eosinophils Relative: 3 %
HCT: 41.9 % (ref 36.0–46.0)
Hemoglobin: 13.4 g/dL (ref 12.0–15.0)
Immature Granulocytes: 0 %
Lymphocytes Relative: 34 %
Lymphs Abs: 2.6 10*3/uL (ref 0.7–4.0)
MCH: 28.9 pg (ref 26.0–34.0)
MCHC: 32 g/dL (ref 30.0–36.0)
MCV: 90.3 fL (ref 80.0–100.0)
Monocytes Absolute: 0.7 10*3/uL (ref 0.1–1.0)
Monocytes Relative: 9 %
Neutro Abs: 4 10*3/uL (ref 1.7–7.7)
Neutrophils Relative %: 53 %
Platelets: 203 10*3/uL (ref 150–400)
RBC: 4.64 MIL/uL (ref 3.87–5.11)
RDW: 13.5 % (ref 11.5–15.5)
WBC: 7.7 10*3/uL (ref 4.0–10.5)
nRBC: 0 % (ref 0.0–0.2)

## 2019-09-15 LAB — CEA: CEA: 3.2 ng/mL (ref 0.0–4.7)

## 2019-09-17 ENCOUNTER — Encounter: Payer: Medicare Other | Admitting: Physician Assistant

## 2019-09-17 ENCOUNTER — Other Ambulatory Visit: Payer: Self-pay

## 2019-09-17 DIAGNOSIS — S8991XA Unspecified injury of right lower leg, initial encounter: Secondary | ICD-10-CM | POA: Diagnosis not present

## 2019-09-17 DIAGNOSIS — I872 Venous insufficiency (chronic) (peripheral): Secondary | ICD-10-CM | POA: Diagnosis not present

## 2019-09-17 DIAGNOSIS — J449 Chronic obstructive pulmonary disease, unspecified: Secondary | ICD-10-CM | POA: Diagnosis not present

## 2019-09-17 DIAGNOSIS — S81811A Laceration without foreign body, right lower leg, initial encounter: Secondary | ICD-10-CM | POA: Diagnosis not present

## 2019-09-17 DIAGNOSIS — I4891 Unspecified atrial fibrillation: Secondary | ICD-10-CM | POA: Diagnosis not present

## 2019-09-17 DIAGNOSIS — I10 Essential (primary) hypertension: Secondary | ICD-10-CM | POA: Diagnosis not present

## 2019-09-17 DIAGNOSIS — S81801A Unspecified open wound, right lower leg, initial encounter: Secondary | ICD-10-CM | POA: Diagnosis not present

## 2019-09-17 DIAGNOSIS — Z87891 Personal history of nicotine dependence: Secondary | ICD-10-CM | POA: Diagnosis not present

## 2019-09-17 NOTE — Progress Notes (Addendum)
PHILAMENA, GERBERS ([id number]) Visit Report for 09/17/2019 Chief Complaint Document Details Patient Name: [name], [name]. Date of Service: 09/17/2019 12:30 PM Medical Record Number: [id number] Patient Account Number: [id number] Date of Birth/Sex: 04/09/1928 (86 y.o. F) Treating RN: [name] Primary Care Provider: Park Liter Other Clinician: Referring Provider: Park Liter Treating Provider/Extender: [name], Bomani Oommen Weeks in Treatment: 1 Information Obtained from: Patient Chief Complaint Right LE skin tear Electronic Signature(s) Signed: 09/17/2019 12:57:35 PM By: Worthy Keeler PA-C Entered By: Worthy Keeler on 09/17/2019 12:57:35 Wroblewski, Tiffany Lemmie Evens ([id number]) -------------------------------------------------------------------------------- HPI Details Patient Name: [name]. Date of Service: 09/17/2019 12:30 PM Medical Record Number: [id number] Patient Account Number: [id number] Date of Birth/Sex: 03/23/1928 (86 y.o. F) Treating RN: [name] Primary Care Provider: Park Liter Other Clinician: Referring Provider: Park Liter Treating Provider/Extender: [name], Shuntavia Yerby Weeks in Treatment: 1 History of Present Illness HPI Description: 02/12/18 ADMISSION This is an 86 year old woman who is recently moved to [place] from [place]. Her story began in February where she fell on some ice suffering extensive lacerations to her bilateral lower extremities. She is able to show me extensive pictures of the right lower leg but with going to a wound care center and [place] 3 times a week these eventually closed over. She has been left with 1 area on the upper lateral left calf. She has been applying Neosporin to this and applying a bandage. Currently this measures 2 x 1.5 cm. The patient is not a diabetic. She is an ex-smoker quitting 40 years ago. She does have COPD by description. ABIs in our clinic were 1.32 on the right and 1.03 on the  left 02/19/18; right lower leg wound which was initially trauma. The area that we look that last week is smaller. Still covered in a nonviable surface however. We are using Iodoflex 02/26/18; right lower leg wound which was initially trauma in the setting of chronic venous insufficiency. Surface of the wound looks much better healthy granulation advancing epithelialization. We have good edema control 03/05/18; right lower leg wound which was initially trauma in the setting of chronic venous insufficiency. She continues to make nice progress here. We have good edema control oHe arrives today with a new traumatic laceration on her right dorsal forearm which she states happened while she was putting on a sweatshirt 03/12/18; right lower leg wound as closed however it still looks vulnerable. She has chronic venous insufficiency. oThe new wound from last week a traumatic area on her right dorsal or arm unfortunately does not have a viable surface. I remove some nonviable skin and necrotic subcutaneous debris from the wound surface. Hemostasis with silver nitrate and direct pressure 03/19/18; right lower leg wound is closed. She has chronic venous insufficiency and will need ongoing compression oThe new wounds from 2 weeks ago on the right upper elbow is closed. The area on her right dorsal forearm continues to have a nonviable surface requiring debridement 03/26/18; we'll need to look into ongoing compression for her legs. 2 wounds on the right upper elbow is closed. The area on the right dorsal forearm looks better. Hydrofera Blue secondary to hypertrophic granulation 04/02/18; he has compression stockings although she did not wear them today. Severe chronic venous insufficiency. She has 2 wounds on the right upper elbow which I said were closed last week which actually are although they're very small and she has a smaller clean wound on the right dorsal forearm. We've been using Hydrofera Blue secondary to  some  upper granulation. All of this looks better 04/09/18-She is seen in follow up evaluation for a right elbow and right forearm skin tear. The right elbow is healed. We will continue with same treatment plan and she'll follow next week 04/16/18-She is seen in follow-up evaluation for right forearm skin tear, this is essentially healed. We will cover with foam border and she will follow-up next week 04/23/18; the patient arrives with a new skin tear on her right dorsal arm just below the elbow. She got this while carrying a box. She has a linear skin tear. There is no depth I don't think this should've been sutured. There is a skin flap medially I'm not sure if this will be viable or not 04/30/18; the new skin tear from last week unfortunately has remained viable in terms of the skin flap. She has a small open area that looks healthy. Using moistened silver collagen 05/07/18; right dorsal arm skin tear. Nonviable tissue over the remnants of the wound. We've been using silver collagen 05/21/18; right dorsal arm skin tear. Nonviable tissue over a small remnant of the wound was washed off. We've been using silver collagen which we will continue 06/04/18; the right dorsal arm skin tear has healed. She arrives today with a traumatic wound just above the olecranon of her left elbow. This is a skin tear with a nonviable nonadherent flap which was removed. We'll use silver collagen here. Also noteworthy she arrives without her compression stockings 06/18/18; the area just above the olecranon of her left elbow. It is smaller and generally has a healthy surface. I was surprised [name], [name]. ([id number]) to learn that the patient is actually changing this herself although. Appears this week she was putting some topical lidocaine on this that she bought over-the-counter at the drugstore. She did get supplies at home she didn't know how to put it on. She drove herself here today i.e. not eligible for home  health Readmission: 11/04/18 on evaluation today patient presents for evaluation our clinic news to include the she has on her lower extremity which occurred as the result of having hit this on a piece of furniture. This was back in December. She states that her daughter has been trying to get her to come into have this evaluated here the wound care center but she was being somewhat stubborn. Nonetheless upon evaluation today the wound really appears to be potentially healed she does have some swelling of lower extremity which I think will come into play. With that being said he tells me that even yesterday this was still making clear fluid and therefore there may be some issues with continued problems with the swelling and drainage secondary to her venous stasis. Nonetheless overall wound appears to be doing fairly well which is good news. 11/11/18 on evaluation today patient actually appears to be doing excellent in the area of question that was green just the day before he saw her last time actually appears to have not drain that always in past week. I think this is done very well and at this point though she's had some discomfort I do not see any evidence of open wound which he needs to continue see wound care. Readmission: 03/10/19 on evaluation today patient presents for reevaluation here in our clinic concerning issues that she actually is having with the new problem on her right lower leg due to trauma that occurred when a car door shut on her leg last Thursday. This is not been quite a  week and she does have several skin tears unfortunately that resulted from this injury. Fortunately there's no signs of active infection at this time. No fevers, chills, nausea, or vomiting noted at this time. She does have some hot tissue noted at this point that has me concerned about the possibility of needing to move this in order to allow the areas to heal appropriately. Fortunately there's no signs of  active infection at this time. 03/17/19-Patient returns to clinic at 1 week, we have been using Prisma on the larger wounds with Kerlix and Xeroform. As many as 5 wounds were number last time 3 of which have closed, leaving the right proximal tibial and right distal medial leg wound both of which are looking good 03/24/19 on evaluation today patient appears to be doing better in regard to her wounds at this time. Fortunately there is no sign of active infection at this time. No fevers chills noted. She's been tolerating the dressing changes without complication which is good news she's been performing these herself it sounds like home health is not going to be coming out it sounds as if they feel like she may be able to take care of yourself and not be technically homebound. Nonetheless though that is unfortunate it does seem like she's done fairly well with taking care of yourself from the standpoint of the dressings. 03/31/19 on evaluation today patient actually appears to be doing well with regard to her lower extremity ulcers. She had one area to heal today and in regard to her right lower extremity were she continues to have an open wound I did have to perform some sharp debridement today. Subsequently the patient still has an area of what appears to be possibly a small hematoma on the anterior portion of her left lower extremity. This is not open enough at this time but nonetheless does seem to show some evidence of hardening which is not necessarily a bad thing. Eventually this may pop off but again I'm not interested in really removing this prematurely. 04/07/19 on evaluation today patient actually appears to be doing well with regard to her lower extremity ulcers. She is been tolerating the dressing changes without complication. She seems to be making excellent progress which is great news. She does have one small new skin tear area but the good news is she was able to pull the skin back over  and it really looks like it is healing quite nicely. Overall that is just a very small region and again seems to be healing nicely. 04/14/19 upon evaluation today patient actually appears to be doing quite well with regard to the wounds on the right lower extremity. In fact everything appears to be healed as of today. This is excellent news. She does however have the area on the left lower extremity which is showing some signs of loosening up we have been watching this area it is no longer fluctuant and in fact with the Betadine on it it is been slowly dry now but is now lifting up on the edge. There may be a wound underneath this region. 04/21/2019 on evaluation today patient appears to be doing excellent in regard to her bilateral lower extremities. In fact she has been tolerating the dressing changes without complication does not seem to show any signs of infection at this point. In fact based on what I am seeing currently I think she is completely healed. [name], [name] 05/04/2019 on evaluation today patient actually appears to be  doing quite well with regard to her right elbow. She has been tolerating the dressing changes without complication. Fortunately there is no signs of active infection at this time. Overall I am very pleased with how things are progressing. The elbow almost appears to be completely healed though it is not 100% today. 05/14/2019 on evaluation today patient actually appears to be doing well with regard to her elbow skin tear that we have been treating in fact this appears to be completely healed. Unfortunately she has a new skin tear noted as of today which actually occurred in the last week since I previously saw her. This again is unfortunate that it has been so long because I am not going to be able to actually pull the skin over where you have to trim this away and let it heal by second intent. This new wound is in the right elbow region as well but  again separate from the original ulcer. 05/19/2019 on evaluation today patient appears to be doing well with regard to her skin tear on her elbow. She has been tolerating the dressing changes without complication. Fortunately there is no signs of active infection at this time. No fever chills noted. Overall I am very pleased with how things appear today. She is not having any significant pain which is also excellent news. 05/26/2019 on evaluation today patient actually appears to be doing well with regard to her right elbow ulcer. She has been tolerating the dressing changes without complication. Fortunately there is no signs of active infection at this time. No fevers, chills, nausea, vomiting, or diarrhea. 06/02/2019 on evaluation today patient appears to be doing very well with regard to her wound of the right elbow. In fact there is just a very small opening still remaining for the most part this is completely closed and appears to be doing excellent. There is no signs of active infection at this time which is also good news. No fevers, chills, nausea, vomiting, or diarrhea. 06/09/2019 on evaluation today patient appears to actually be doing excellent with regard to her original wound on her elbow which really appears to be completely healed at this point. With that being said she is having issues with an area generally of weeping in this region which I am unsure exactly what is causing this. Does not appear to be obviously bacterially infected although that could be part of the issue. With that being said I do believe that there is also a chance there could be a fungal infection here. 06/18/2019 on evaluation today patient's elbow ulcer appears to be doing much better I do believe this was more likely a fungal infection that was going on at the last visit and fortunately she seems to be doing much better at this point. With that being said this is not completely healed but I am hopeful will be so  by next week. She is very pleased that is really not having any pain in regard to the elbow. 06/25/2019 upon evaluation today patient appears to be doing well with regard to her elbow ulcer that we have been taking care of unfortunately she has a new skin tear on her forearm which has arisen since I last saw her. This she states resulted from a fall that she had tried to get out of her bathtub on Tuesday. Obviously this is not good news she continues to have significant falls which I am very concerned about to be perfectly honest. I explained to her that if  she is falling she runs the risk of injuring her head or potentially breaking something that could be more significant which she states she understands but she just "tries not to think about it". I explained to her that I feel like this is something that she absolutely needs to think about in order to protect herself.-Suggested she may want to consider an assisted living facility she is in an apartment complex where she does have friends she really does not want to move out I therefore told her that at minimum I would recommend that she get in touch with her daughter and attempt if at all possible to have her contact her each day which she states they typically do anyway and if she is unable to get in touch with her that she subsequently call someone to check on her mother and again if they are not able to then have a way to be able to get into her apartment to check on her in case something untoward happens. 07/09/2019 on evaluation today patient actually appears to be doing excellent. She has been tolerating the dressing changes and her wound appears to be completely healed which is great news she has no new skin tears at this point. Readmission: 09/10/2019 patient presents today for readmission in our clinic due to a skin tear that she has on the right lower extremity which actually occurred 2 days ago. She was actually seen last on July 09, 2019 when she had completely healed from her prior skin tear on her arm. Fortunately there is no signs of active infection at this time. No fever chills noted. With that being said the patient tells me that she is not having any severe pain she did try to pull this back over as much as she could. This injury occurred as a result of hitting her leg on the car door. No fevers, chills, nausea, vomiting, or diarrhea. 09/17/2019 upon evaluation today patient appears to be doing well with regard to the overall dressing changes at this point. [name], [name] ([id number]) Fortunately there is no signs of active infection at this time. No fevers, chills, nausea, vomiting, or diarrhea. I am very pleased with how things seem to be progressing at this point. Electronic Signature(s) Signed: 09/17/2019 1:07:00 PM By: Worthy Keeler PA-C Entered By: Worthy Keeler on 09/17/2019 13:07:00 [name], [name] -------------------------------------------------------------------------------- Physical Exam Details Patient Name: [name], [name] Date of Service: 09/17/2019 12:30 PM Medical Record Number: [id number] Patient Account Number: [id number] Date of Birth/Sex: 10/31/1927 (86 y.o. F) Treating RN: [name] Primary Care Provider: Park Liter Other Clinician: Referring Provider: Park Liter Treating Provider/Extender: [name], Rolen Conger Weeks in Treatment: 1 Constitutional Well-nourished and well-hydrated in no acute distress. Respiratory normal breathing without difficulty. Psychiatric this patient is able to make decisions and demonstrates good insight into disease process. Alert and Oriented x 3. pleasant and cooperative. Notes Fortunately a large portion of the skin flap that was torn back during the skin tear actually did reattach and survive. There is a small area especially on the medial/inferior location which does appear to have necrosis and not survived. Subsequently  this was actually loose and I was able to mechanically debride this away with saline gauze patient tolerated this today without complication post debridement the wound bed appears to be doing excellent. Electronic Signature(s) Signed: 09/17/2019 1:07:16 PM By: Worthy Keeler PA-C Entered By: Worthy Keeler on 09/17/2019 13:07:16 CHAIRTY, RUMBERGER ([id number]) -------------------------------------------------------------------------------- Physician Orders Details  Patient Name: [name], TREMBLE. Date of Service: 09/17/2019 12:30 PM Medical Record Number: [id number] Patient Account Number: [id number] Date of Birth/Sex: 05/19/1928 (86 y.o. F) Treating RN: [name] Primary Care Provider: Park Liter Other Clinician: Referring Provider: Park Liter Treating Provider/Extender: [name], Bonni Neuser Weeks in Treatment: 1 Verbal / Phone Orders: No Diagnosis Coding ICD-10 Coding Code Description I87.2 Venous insufficiency (chronic) (peripheral) S81.801A Unspecified open wound, right lower leg, initial encounter Wound Cleansing Wound #18 Right,Anterior Lower Leg o Clean wound with Normal Saline. Primary Wound Dressing Wound #18 Right,Anterior Lower Leg o Xeroform Secondary Dressing Wound #18 Right,Anterior Lower Leg o Boardered Foam Dressing Dressing Change Frequency Wound #18 Right,Anterior Lower Leg o Change dressing every other day. Electronic Signature(s) Signed: 09/17/2019 2:34:43 PM By: [name] Signed: 09/17/2019 3:59:29 PM By: Worthy Keeler PA-C Entered By: [name] on 09/17/2019 13:04:52 Tugwell, Danine Lemmie Evens ([id number]) -------------------------------------------------------------------------------- Problem List Details Patient Name: [name], [name] Date of Service: 09/17/2019 12:30 PM Medical Record Number: [id number] Patient Account Number: [id number] Date of Birth/Sex: [birth year] (86 y.o. F) Treating RN: [name] Primary Care Provider:  Park Liter Other Clinician: Referring Provider: Park Liter Treating Provider/Extender: [name], Markie Heffernan Weeks in Treatment: 1 Active Problems ICD-10 Evaluated Encounter Code Description Active Date Today Diagnosis I87.2 Venous insufficiency (chronic) (peripheral) 09/10/2019 No Yes S81.801A Unspecified open wound, right lower leg, initial encounter 09/10/2019 No Yes Inactive Problems Resolved Problems Electronic Signature(s) Signed: 09/17/2019 12:57:26 PM By: Worthy Keeler PA-C Entered By: Worthy Keeler on 09/17/2019 12:57:26 Bellerose, Amberlynn H. ([id number]) -------------------------------------------------------------------------------- Progress Note Details Patient Name: [name], [name] Date of Service: 09/17/2019 12:30 PM Medical Record Number: [id number] Patient Account Number: [id number] Date of Birth/Sex: 06/12/1928 (86 y.o. F) Treating RN: [name] Primary Care Provider: Park Liter Other Clinician: Referring Provider: Park Liter Treating Provider/Extender: [name], Chessica Audia Weeks in Treatment: 1 Subjective Chief Complaint Information obtained from Patient Right LE skin tear History of Present Illness (HPI) 02/12/18 ADMISSION This is an 86 year old woman who is recently moved to [place] from [place]. Her story began in February where she fell on some ice suffering extensive lacerations to her bilateral lower extremities. She is able to show me extensive pictures of the right lower leg but with going to a wound care center and [place] 3 times a week these eventually closed over. She has been left with 1 area on the upper lateral left calf. She has been applying Neosporin to this and applying a bandage. Currently this measures 2 x 1.5 cm. The patient is not a diabetic. She is an ex-smoker quitting 40 years ago. She does have COPD by description. ABIs in our clinic were 1.32 on the right and 1.03 on the left 02/19/18; right lower leg wound which was  initially trauma. The area that we look that last week is smaller. Still covered in a nonviable surface however. We are using Iodoflex 02/26/18; right lower leg wound which was initially trauma in the setting of chronic venous insufficiency. Surface of the wound looks much better healthy granulation advancing epithelialization. We have good edema control 03/05/18; right lower leg wound which was initially trauma in the setting of chronic venous insufficiency. She continues to make nice progress here. We have good edema control He arrives today with a new traumatic laceration on her right dorsal forearm which she states happened while she was putting on a sweatshirt 03/12/18; right lower leg wound as closed however it still looks vulnerable. She has chronic venous insufficiency. The new  wound from last week a traumatic area on her right dorsal or arm unfortunately does not have a viable surface. I remove some nonviable skin and necrotic subcutaneous debris from the wound surface. Hemostasis with silver nitrate and direct pressure 03/19/18; right lower leg wound is closed. She has chronic venous insufficiency and will need ongoing compression The new wounds from 2 weeks ago on the right upper elbow is closed. The area on her right dorsal forearm continues to have a nonviable surface requiring debridement 03/26/18; we'll need to look into ongoing compression for her legs. 2 wounds on the right upper elbow is closed. The area on the right dorsal forearm looks better. Hydrofera Blue secondary to hypertrophic granulation 04/02/18; he has compression stockings although she did not wear them today. Severe chronic venous insufficiency. She has 2 wounds on the right upper elbow which I said were closed last week which actually are although they're very small and she has a smaller clean wound on the right dorsal forearm. We've been using Hydrofera Blue secondary to some upper granulation. All of this looks  better 04/09/18-She is seen in follow up evaluation for a right elbow and right forearm skin tear. The right elbow is healed. We will continue with same treatment plan and she'll follow next week 04/16/18-She is seen in follow-up evaluation for right forearm skin tear, this is essentially healed. We will cover with foam border and she will follow-up next week 04/23/18; the patient arrives with a new skin tear on her right dorsal arm just below the elbow. She got this while carrying a box. She has a linear skin tear. There is no depth I don't think this should've been sutured. There is a skin flap medially I'm not sure if this will be viable or not 04/30/18; the new skin tear from last week unfortunately has remained viable in terms of the skin flap. She has a small open area that looks healthy. Using moistened silver collagen 05/07/18; right dorsal arm skin tear. Nonviable tissue over the remnants of the wound. We've been using silver collagen PAYSLEIGH, FRYMIER ([id number]) 05/21/18; right dorsal arm skin tear. Nonviable tissue over a small remnant of the wound was washed off. We've been using silver collagen which we will continue 06/04/18; the right dorsal arm skin tear has healed. She arrives today with a traumatic wound just above the olecranon of her left elbow. This is a skin tear with a nonviable nonadherent flap which was removed. We'll use silver collagen here. Also noteworthy she arrives without her compression stockings 06/18/18; the area just above the olecranon of her left elbow. It is smaller and generally has a healthy surface. I was surprised to learn that the patient is actually changing this herself although. Appears this week she was putting some topical lidocaine on this that she bought over-the-counter at the drugstore. She did get supplies at home she didn't know how to put it on. She drove herself here today i.e. not eligible for home health Readmission: 11/04/18 on evaluation  today patient presents for evaluation our clinic news to include the she has on her lower extremity which occurred as the result of having hit this on a piece of furniture. This was back in December. She states that her daughter has been trying to get her to come into have this evaluated here the wound care center but she was being somewhat stubborn. Nonetheless upon evaluation today the wound really appears to be potentially healed she does have some swelling  of lower extremity which I think will come into play. With that being said he tells me that even yesterday this was still making clear fluid and therefore there may be some issues with continued problems with the swelling and drainage secondary to her venous stasis. Nonetheless overall wound appears to be doing fairly well which is good news. 11/11/18 on evaluation today patient actually appears to be doing excellent in the area of question that was green just the day before he saw her last time actually appears to have not drain that always in past week. I think this is done very well and at this point though she's had some discomfort I do not see any evidence of open wound which he needs to continue see wound care. Readmission: 03/10/19 on evaluation today patient presents for reevaluation here in our clinic concerning issues that she actually is having with the new problem on her right lower leg due to trauma that occurred when a car door shut on her leg last Thursday. This is not been quite a week and she does have several skin tears unfortunately that resulted from this injury. Fortunately there's no signs of active infection at this time. No fevers, chills, nausea, or vomiting noted at this time. She does have some hot tissue noted at this point that has me concerned about the possibility of needing to move this in order to allow the areas to heal appropriately. Fortunately there's no signs of active infection at this time. 03/17/19-Patient  returns to clinic at 1 week, we have been using Prisma on the larger wounds with Kerlix and Xeroform. As many as 5 wounds were number last time 3 of which have closed, leaving the right proximal tibial and right distal medial leg wound both of which are looking good 03/24/19 on evaluation today patient appears to be doing better in regard to her wounds at this time. Fortunately there is no sign of active infection at this time. No fevers chills noted. She's been tolerating the dressing changes without complication which is good news she's been performing these herself it sounds like home health is not going to be coming out it sounds as if they feel like she may be able to take care of yourself and not be technically homebound. Nonetheless though that is unfortunate it does seem like she's done fairly well with taking care of yourself from the standpoint of the dressings. 03/31/19 on evaluation today patient actually appears to be doing well with regard to her lower extremity ulcers. She had one area to heal today and in regard to her right lower extremity were she continues to have an open wound I did have to perform some sharp debridement today. Subsequently the patient still has an area of what appears to be possibly a small hematoma on the anterior portion of her left lower extremity. This is not open enough at this time but nonetheless does seem to show some evidence of hardening which is not necessarily a bad thing. Eventually this may pop off but again I'm not interested in really removing this prematurely. 04/07/19 on evaluation today patient actually appears to be doing well with regard to her lower extremity ulcers. She is been tolerating the dressing changes without complication. She seems to be making excellent progress which is great news. She does have one small new skin tear area but the good news is she was able to pull the skin back over and it really looks like it is healing quite  nicely. Overall that is just a very small region and again seems to be healing nicely. 04/14/19 upon evaluation today patient actually appears to be doing quite well with regard to the wounds on the right lower extremity. In fact everything appears to be healed as of today. This is excellent news. She does however have the area on the left lower extremity which is showing some signs of loosening up we have been watching this area it is no longer fluctuant and in fact with the Betadine on it it is been slowly dry now but is now lifting up on the edge. There may be a wound underneath [name], [name] this region. 04/21/2019 on evaluation today patient appears to be doing excellent in regard to her bilateral lower extremities. In fact she has been tolerating the dressing changes without complication does not seem to show any signs of infection at this point. In fact based on what I am seeing currently I think she is completely healed. 05/04/2019 on evaluation today patient actually appears to be doing quite well with regard to her right elbow. She has been tolerating the dressing changes without complication. Fortunately there is no signs of active infection at this time. Overall I am very pleased with how things are progressing. The elbow almost appears to be completely healed though it is not 100% today. 05/14/2019 on evaluation today patient actually appears to be doing well with regard to her elbow skin tear that we have been treating in fact this appears to be completely healed. Unfortunately she has a new skin tear noted as of today which actually occurred in the last week since I previously saw her. This again is unfortunate that it has been so long because I am not going to be able to actually pull the skin over where you have to trim this away and let it heal by second intent. This new wound is in the right elbow region as well but again separate from the original  ulcer. 05/19/2019 on evaluation today patient appears to be doing well with regard to her skin tear on her elbow. She has been tolerating the dressing changes without complication. Fortunately there is no signs of active infection at this time. No fever chills noted. Overall I am very pleased with how things appear today. She is not having any significant pain which is also excellent news. 05/26/2019 on evaluation today patient actually appears to be doing well with regard to her right elbow ulcer. She has been tolerating the dressing changes without complication. Fortunately there is no signs of active infection at this time. No fevers, chills, nausea, vomiting, or diarrhea. 06/02/2019 on evaluation today patient appears to be doing very well with regard to her wound of the right elbow. In fact there is just a very small opening still remaining for the most part this is completely closed and appears to be doing excellent. There is no signs of active infection at this time which is also good news. No fevers, chills, nausea, vomiting, or diarrhea. 06/09/2019 on evaluation today patient appears to actually be doing excellent with regard to her original wound on her elbow which really appears to be completely healed at this point. With that being said she is having issues with an area generally of weeping in this region which I am unsure exactly what is causing this. Does not appear to be obviously bacterially infected although that could be part of the issue. With that being said I do believe  that there is also a chance there could be a fungal infection here. 06/18/2019 on evaluation today patient's elbow ulcer appears to be doing much better I do believe this was more likely a fungal infection that was going on at the last visit and fortunately she seems to be doing much better at this point. With that being said this is not completely healed but I am hopeful will be so by next week. She is very pleased  that is really not having any pain in regard to the elbow. 06/25/2019 upon evaluation today patient appears to be doing well with regard to her elbow ulcer that we have been taking care of unfortunately she has a new skin tear on her forearm which has arisen since I last saw her. This she states resulted from a fall that she had tried to get out of her bathtub on Tuesday. Obviously this is not good news she continues to have significant falls which I am very concerned about to be perfectly honest. I explained to her that if she is falling she runs the risk of injuring her head or potentially breaking something that could be more significant which she states she understands but she just "tries not to think about it". I explained to her that I feel like this is something that she absolutely needs to think about in order to protect herself.-Suggested she may want to consider an assisted living facility she is in an apartment complex where she does have friends she really does not want to move out I therefore told her that at minimum I would recommend that she get in touch with her daughter and attempt if at all possible to have her contact her each day which she states they typically do anyway and if she is unable to get in touch with her that she subsequently call someone to check on her mother and again if they are not able to then have a way to be able to get into her apartment to check on her in case something untoward happens. 07/09/2019 on evaluation today patient actually appears to be doing excellent. She has been tolerating the dressing changes and her wound appears to be completely healed which is great news she has no new skin tears at this point. Readmission: 09/10/2019 patient presents today for readmission in our clinic due to a skin tear that she has on the right lower extremity which [name], [name]. ([id number]) actually occurred 2 days ago. She was actually seen last on July 09, 2019 when she had completely healed from her prior skin tear on her arm. Fortunately there is no signs of active infection at this time. No fever chills noted. With that being said the patient tells me that she is not having any severe pain she did try to pull this back over as much as she could. This injury occurred as a result of hitting her leg on the car door. No fevers, chills, nausea, vomiting, or diarrhea. 09/17/2019 upon evaluation today patient appears to be doing well with regard to the overall dressing changes at this point. Fortunately there is no signs of active infection at this time. No fevers, chills, nausea, vomiting, or diarrhea. I am very pleased with how things seem to be progressing at this point. Objective Constitutional Well-nourished and well-hydrated in no acute distress. Vitals Time Taken: 12:42 PM, Height: 60 in, Weight: 96 lbs, BMI: 18.7, Temperature: 97.8 F, Pulse: 70 bpm, Respiratory Rate: 16 breaths/min, Blood Pressure: 166/54  mmHg. Respiratory normal breathing without difficulty. Psychiatric this patient is able to make decisions and demonstrates good insight into disease process. Alert and Oriented x 3. pleasant and cooperative. General Notes: Fortunately a large portion of the skin flap that was torn back during the skin tear actually did reattach and survive. There is a small area especially on the medial/inferior location which does appear to have necrosis and not survived. Subsequently this was actually loose and I was able to mechanically debride this away with saline gauze patient tolerated this today without complication post debridement the wound bed appears to be doing excellent. Integumentary (Hair, Skin) Wound #18 status is Open. Original cause of wound was Trauma. The wound is located on the Right,Anterior Lower Leg. The wound measures 2.1cm length x 1.3cm width x 0.1cm depth; 2.144cm^2 area and 0.214cm^3 volume. There is Fat Layer (Subcutaneous  Tissue) Exposed exposed. There is no tunneling or undermining noted. There is a medium amount of sanguinous drainage noted. The wound margin is flat and intact. There is medium (34-66%) pink granulation within the wound bed. There is a medium (34-66%) amount of necrotic tissue within the wound bed including Adherent Slough. Assessment Active Problems ICD-10 Venous insufficiency (chronic) (peripheral) Unspecified open wound, right lower leg, initial encounter [name], [name] ([id number]) Plan Wound Cleansing: Wound #18 Right,Anterior Lower Leg: Clean wound with Normal Saline. Primary Wound Dressing: Wound #18 Right,Anterior Lower Leg: Xeroform Secondary Dressing: Wound #18 Right,Anterior Lower Leg: Boardered Foam Dressing Dressing Change Frequency: Wound #18 Right,Anterior Lower Leg: Change dressing every other day. 1. My suggestion at this time is good to be that we actually go ahead and continue with the Xeroform gauze dressing I think this is probably the best option for her and she knows how to do this and seems to be doing very well. We will cover this with a border foam dressing. 2. I would recommend as well she continue to be very careful and cautious in regard to getting around obviously any bumps or next that she sustained skin call skin tears her skin is very fragile both arms and legs. 3. Fortunately the patient does not seem to have any significant swelling I think that she is doing well pleased in that regard. We will see patient back for reevaluation in 1 week here in the clinic. If anything worsens or changes patient will contact our office for additional recommendations. Electronic Signature(s) Signed: 09/17/2019 1:08:01 PM By: Worthy Keeler PA-C Entered By: Worthy Keeler on 09/17/2019 13:08:00 Senat, [name] ([id number]) -------------------------------------------------------------------------------- SuperBill Details Patient Name: [name]. Date of Service: 09/17/2019 Medical Record Number: [id number] Patient Account Number: [id number] Date of Birth/Sex: Oct 01, 1927 (86 y.o. F) Treating RN: [name] Primary Care Provider: Park Liter Other Clinician: Referring Provider: Park Liter Treating Provider/Extender: [name], Emilynn Srinivasan Weeks in Treatment: 1 Diagnosis Coding ICD-10 Codes Code Description I87.2 Venous insufficiency (chronic) (peripheral) S81.801A Unspecified open wound, right lower leg, initial encounter Facility Procedures CPT4 Code: [id number] Description: 99213 - WOUND CARE VISIT-LEV 3 EST PT Modifier: Quantity: 1 Physician Procedures CPT4 Code: [id number] Description: [id number] - WC PHYS LEVEL 3 - EST PT ICD-10 Diagnosis Description I87.2 Venous insufficiency (chronic) (peripheral) S81.801A Unspecified open wound, right lower leg, initial encounte Modifier: r Quantity: 1 Electronic Signature(s) Signed: 09/17/2019 1:08:56 PM By: Worthy Keeler PA-C Entered By: Worthy Keeler on 09/17/2019 13:08:55

## 2019-09-21 ENCOUNTER — Telehealth: Payer: Self-pay | Admitting: Family Medicine

## 2019-09-21 DIAGNOSIS — E039 Hypothyroidism, unspecified: Secondary | ICD-10-CM

## 2019-09-21 MED ORDER — LEVOTHYROXINE SODIUM 150 MCG PO TABS: 150.0000 ug | ORAL_TABLET | Freq: Every day | ORAL | 1 refills | Status: DC

## 2019-09-21 NOTE — Telephone Encounter (Signed)
Please let her know that her thyroid got worse rather than better. Please check to see if she's been taking her medicine every day. If she has- I'm going to increase her to 124mcg and we'll recheck in 6 weeks. If she hasn't- take it every day and we'll recheck in 6 weeks. Thanks! Let me know if I need to send in more meds.

## 2019-09-21 NOTE — Telephone Encounter (Signed)
Called and spoke with patient. Patient stated that she has been taking her thyroid medication as prescribed. Advised patient that Dr. Wynetta Emery will be sending medication over to her pharmacy. Pt agreed with it.

## 2019-09-21 NOTE — Progress Notes (Signed)
[name] Visit Report for 09/17/2019 Arrival Information Details Patient Name: [name], [name]. Date of Service: 09/17/2019 12:30 PM Medical Record Number: [id number] Patient Account Number: [id number] Date of Birth/Sex: 1928/01/06 (86 y.o. F) Treating RN: [name] Primary Care Angello Chien: Park Liter Other Clinician: Referring Airam Heidecker: Park Liter Treating [name]/Extender: [name], [name] Weeks in Treatment: 1 Visit Information History Since Last Visit Added or deleted any medications: No Patient Arrived: Ambulatory Any new allergies or adverse reactions: No Arrival Time: 12:43 Had a fall or experienced change in No Accompanied By: self activities of daily living that may affect Transfer Assistance: None risk of falls: Patient Identification Verified: Yes Signs or symptoms of abuse/neglect since last visito No Secondary Verification Process Completed: Yes Hospitalized since last visit: No Implantable device outside of the clinic excluding No cellular tissue based products placed in the center since last visit: Has Dressing in Place as Prescribed: Yes Pain Present Now: No Electronic Signature(s) Signed: 09/21/2019 11:15:10 AM By: Lorine Bears RCP, RRT, CHT Entered By: Lorine Bears on 09/17/2019 12:44:33 [name] -------------------------------------------------------------------------------- Clinic Level of Care Assessment Details Patient Name: [name], [name]. Date of Service: 09/17/2019 12:30 PM Medical Record Number: [id number] Patient Account Number: [id number] Date of Birth/Sex: 01-28-1928 (86 y.o. F) Treating RN: [name] Primary Care Irlene Crudup: Park Liter Other Clinician: Referring Cerise Lieber: Park Liter Treating Destony Prevost/Extender: [name], [name] Weeks in Treatment: 1 Clinic Level of Care Assessment Items TOOL 4 Quantity Score []  - Use when only an EandM is  performed on FOLLOW-UP visit 0 ASSESSMENTS - Nursing Assessment / Reassessment X - Reassessment of Co-morbidities (includes updates in patient status) 1 10 X- 1 5 Reassessment of Adherence to Treatment Plan ASSESSMENTS - Wound and Skin Assessment / Reassessment X - Simple Wound Assessment / Reassessment - one wound 1 5 []  - 0 Complex Wound Assessment / Reassessment - multiple wounds []  - 0 Dermatologic / Skin Assessment (not related to wound area) ASSESSMENTS - Focused Assessment []  - Circumferential Edema Measurements - multi extremities 0 []  - 0 Nutritional Assessment / Counseling / Intervention []  - 0 Lower Extremity Assessment (monofilament, tuning fork, pulses) []  - 0 Peripheral Arterial Disease Assessment (using hand held doppler) ASSESSMENTS - Ostomy and/or Continence Assessment and Care []  - Incontinence Assessment and Management 0 []  - 0 Ostomy Care Assessment and Management (repouching, etc.) PROCESS - Coordination of Care X - Simple Patient / Family Education for ongoing care 1 15 []  - 0 Complex (extensive) Patient / Family Education for ongoing care []  - 0 Staff obtains Programmer, systems, Records, Test Results / Process Orders []  - 0 Staff telephones HHA, Nursing Homes / Clarify orders / etc []  - 0 Routine Transfer to another Facility (non-emergent condition) []  - 0 Routine Hospital Admission (non-emergent condition) []  - 0 New Admissions / Biomedical engineer / Ordering NPWT, Apligraf, etc. []  - 0 Emergency Hospital Admission (emergent condition) X- 1 10 Simple Discharge Coordination Wise, [name] ([id number]) []  - 0 Complex (extensive) Discharge Coordination PROCESS - Special Needs []  - Pediatric / Minor Patient Management 0 []  - 0 Isolation Patient Management []  - 0 Hearing / Language / Visual special needs []  - 0 Assessment of Community assistance (transportation, D/C planning, etc.) []  - 0 Additional assistance / Altered mentation []  -  0 Support Surface(s) Assessment (bed, cushion, seat, etc.) INTERVENTIONS - Wound Cleansing / Measurement X - Simple Wound Cleansing - one wound 1 5 []  - 0 Complex Wound Cleansing - multiple wounds  X- 1 5 Wound Imaging (photographs - any number of wounds) []  - 0 Wound Tracing (instead of photographs) X- 1 5 Simple Wound Measurement - one wound []  - 0 Complex Wound Measurement - multiple wounds INTERVENTIONS - Wound Dressings []  - Small Wound Dressing one or multiple wounds 0 X- 1 15 Medium Wound Dressing one or multiple wounds []  - 0 Large Wound Dressing one or multiple wounds []  - 0 Application of Medications - topical []  - 0 Application of Medications - injection INTERVENTIONS - Miscellaneous []  - External ear exam 0 []  - 0 Specimen Collection (cultures, biopsies, blood, body fluids, etc.) []  - 0 Specimen(s) / Culture(s) sent or taken to Lab for analysis []  - 0 Patient Transfer (multiple staff / Civil Service fast streamer / Similar devices) []  - 0 Simple Staple / Suture removal (25 or less) []  - 0 Complex Staple / Suture removal (26 or more) []  - 0 Hypo / Hyperglycemic Management (close monitor of Blood Glucose) []  - 0 Ankle / Brachial Index (ABI) - do not check if billed separately X- 1 5 Vital Signs [name], [name] ([id number]) Has the patient been seen at the hospital within the last three years: Yes Total Score: 80 Level Of Care: New/Established - Level 3 Electronic Signature(s) Signed: 09/17/2019 2:34:43 PM By: [name] Entered By: [name] on 09/17/2019 13:05:22 [name] -------------------------------------------------------------------------------- Encounter Discharge Information Details Patient Name: Carol Berger H. Date of Service: 09/17/2019 12:30 PM Medical Record Number: [id number] Patient Account Number: [id number] Date of Birth/Sex: 08/13/1928 (86 y.o. F) Treating RN: [name] Primary Care Kylon Philbrook: Park Liter  Other Clinician: Referring Kassadie Pancake: Park Liter Treating Adil Tugwell/Extender: [name], [name] Weeks in Treatment: 1 Encounter Discharge Information Items Discharge Condition: Stable Ambulatory Status: Ambulatory Discharge Destination: Home Transportation: Private Auto Accompanied By: self Schedule Follow-up Appointment: Yes Clinical Summary of Care: Electronic Signature(s) Signed: 09/17/2019 2:34:43 PM By: [name] Entered By: [name] on 09/17/2019 13:05:56 [name], [name] ([id number]) -------------------------------------------------------------------------------- Lower Extremity Assessment Details Patient Name: Berndt, Gazelle H. Date of Service: 09/17/2019 12:30 PM Medical Record Number: [id number] Patient Account Number: [id number] Date of Birth/Sex: 08-06-1928 (86 y.o. F) Treating RN: Montey Hora Primary Care Vincenza Dail: Park Liter Other Clinician: Referring Savera Donson: Park Liter Treating Berkeley Vanaken/Extender: [name], [name] Weeks in Treatment: 1 Edema Assessment Assessed: [Left: No] [Right: No] Edema: [Left: N] [Right: o] Electronic Signature(s) Signed: 09/17/2019 2:52:01 PM By: Montey Hora Entered By: Montey Hora on 09/17/2019 12:51:00 [name], [name] ([id number]) -------------------------------------------------------------------------------- Multi Wound Chart Details Patient Name: Postel, Lari H. Date of Service: 09/17/2019 12:30 PM Medical Record Number: [id number] Patient Account Number: [id number] Date of Birth/Sex: [birth year] (86 y.o. F) Treating RN: [name] Primary Care Roxane Puerto: Park Liter Other Clinician: Referring Karolyn Messing: Park Liter Treating Keierra Nudo/Extender: [name], [name] Weeks in Treatment: 1 Vital Signs Height(in): 60 Pulse(bpm): 70 Weight(lbs): 96 Blood Pressure(mmHg): 166/54 Body Mass Index(BMI): 19 Temperature(F): 97.8 Respiratory Rate 16 (breaths/min): Photos: [N/A:N/A] Wound Location:  Right Lower Leg - Anterior N/A N/A Wounding Event: Trauma N/A N/A Primary Etiology: Trauma, Other N/A N/A Comorbid History: Cataracts, Glaucoma, Asthma, N/A N/A Chronic Obstructive Pulmonary Disease (COPD), Arrhythmia, Hypertension Date Acquired: 09/08/2019 N/A N/A Weeks of Treatment: 1 N/A N/A Wound Status: Open N/A N/A Measurements L x W x D 2.1x1.3x0.1 N/A N/A (cm) Area (cm) : 2.144 N/A N/A Volume (cm) : 0.214 N/A N/A % Reduction in Area: 63.90% N/A N/A % Reduction in Volume: 64.00% N/A N/A Classification: Full Thickness Without N/A N/A Exposed Support Structures Exudate  Amount: Medium N/A N/A Exudate Type: Sanguinous N/A N/A Exudate Color: red N/A N/A Wound Margin: Flat and Intact N/A N/A Granulation Amount: Medium (34-66%) N/A N/A Granulation Quality: Pink N/A N/A Necrotic Amount: Medium (34-66%) N/A N/A Exposed Structures: Fat Layer (Subcutaneous N/A N/A Tissue) Exposed: Yes Fascia: No Tendon: No [name], [name] ([id number]) Muscle: No Joint: No Bone: No Epithelialization: None N/A N/A Treatment Notes Electronic Signature(s) Signed: 09/17/2019 2:34:43 PM By: [name] Entered By: [name] on 09/17/2019 13:02:52 Adames, [name] -------------------------------------------------------------------------------- [place] Details Patient Name: Carol Games. Date of Service: 09/17/2019 12:30 PM Medical Record Number: [id number] Patient Account Number: [id number] Date of Birth/Sex: 09/24/1927 (86 y.o. F) Treating RN: [name] Primary Care Elenore Wanninger: Park Liter Other Clinician: Referring Amiri Tritch: Park Liter Treating Hassaan Crite/Extender: [name], [name] Weeks in Treatment: 1 Active Inactive Orientation to the Wound Care Program Nursing Diagnoses: Knowledge deficit related to the wound healing center program Goals: Patient/caregiver will verbalize understanding of the [place] Program Date  Initiated: 09/10/2019 Target Resolution Date: 10/09/2019 Goal Status: Active Interventions: Provide education on orientation to the wound center Notes: Wound/Skin Impairment Nursing Diagnoses: Impaired tissue integrity Goals: Ulcer/skin breakdown will have a volume reduction of 30% by week 4 Date Initiated: 09/10/2019 Target Resolution Date: 10/08/2019 Goal Status: Active Interventions: Assess ulceration(s) every visit Notes: Electronic Signature(s) Signed: 09/17/2019 2:34:43 PM By: [name] Entered By: [name] on 09/17/2019 13:02:44 [name], [name] ([id number]) -------------------------------------------------------------------------------- Pain Assessment Details Patient Name: Carol Games. Date of Service: 09/17/2019 12:30 PM Medical Record Number: [id number] Patient Account Number: [id number] Date of Birth/Sex: 06/08/1928 (86 y.o. F) Treating RN: [name] Primary Care Leverett Camplin: Park Liter Other Clinician: Referring Vonda Harth: Park Liter Treating Gracilyn Gunia/Extender: [name], [name] Weeks in Treatment: 1 Active Problems Location of Pain Severity and Description of Pain Patient Has Paino No Site Locations Pain Management and Medication Current Pain Management: Electronic Signature(s) Signed: 09/17/2019 2:34:43 PM By: [name] Signed: 09/21/2019 11:15:10 AM By: Lorine Bears RCP, RRT, CHT Entered By: Lorine Bears on 09/17/2019 12:44:40 [name], [name] -------------------------------------------------------------------------------- Patient/Caregiver Education Details Patient Name: Carol Games. Date of Service: 09/17/2019 12:30 PM Medical Record Number: [id number] Patient Account Number: [id number] Date of Birth/Gender: March 30, 1928 (86 y.o. F) Treating RN: [name] Primary Care Physician: Park Liter Other Clinician: Referring Physician: Park Liter Treating Physician/Extender: Sharalyn Ink in Treatment: 1 Education Assessment Education Provided To: Patient Education Topics Provided Wound/Skin Impairment: Handouts: Caring for Your Ulcer Methods: Demonstration, Explain/Verbal Responses: State content correctly Electronic Signature(s) Signed: 09/17/2019 2:34:43 PM By: [name] Entered By: [name] on 09/17/2019 13:05:32 [name], Lannon. ([id number]) -------------------------------------------------------------------------------- Wound Assessment Details Patient Name: [name], [name] Date of Service: 09/17/2019 12:30 PM Medical Record Number: [id number] Patient Account Number: [id number] Date of Birth/Sex: 12/05/1927 (86 y.o. F) Treating RN: Montey Hora Primary Care Cainan Trull: Park Liter Other Clinician: Referring Damarkus Balis: Park Liter Treating Torres Hardenbrook/Extender: [name], [name] Weeks in Treatment: 1 Wound Status Wound Number: [id number] Primary Trauma, Other Etiology: Wound Location: Right Lower Leg - Anterior Wound Open Wounding Event: Trauma Status: Date Acquired: 09/08/2019 Comorbid Cataracts, Glaucoma, Asthma, Chronic Weeks Of Treatment: 1 History: Obstructive Pulmonary Disease (COPD), Clustered Wound: No Arrhythmia, Hypertension Photos Wound Measurements Length: (cm) 2.1 Width: (cm) 1.3 Depth: (cm) 0.1 Area: (cm) 2.144 Volume: (cm) 0.214 % Reduction in Area: 63.9% % Reduction in Volume: 64% Epithelialization: None Tunneling: No Undermining: No Wound Description Full Thickness Without Exposed Support Foul Odo Classification: Structures  Slough/F Wound Margin: Flat and Intact Exudate Medium Amount: Exudate Type: Sanguinous Exudate Color: red r After Cleansing: No ibrino Yes Wound Bed Granulation Amount: Medium (34-66%) Exposed Structure Granulation Quality: Pink Fascia Exposed: No Necrotic Amount: Medium (34-66%) Fat Layer (Subcutaneous Tissue) Exposed: Yes Necrotic Quality: Adherent Slough Tendon  Exposed: No Muscle Exposed: No Joint Exposed: No Bone Exposed: No [name], [name] ([id number]) Treatment Notes Wound #18 (Right, Anterior Lower Leg) Notes xeroform, BFD Electronic Signature(s) Signed: 09/17/2019 2:52:01 PM By: Montey Hora Entered By: Montey Hora on 09/17/2019 12:49:59 [name], Carol Lemmie Evens ([id number]) -------------------------------------------------------------------------------- Vitals Details Patient Name: Carol Berger H. Date of Service: 09/17/2019 12:30 PM Medical Record Number: [id number] Patient Account Number: [id number] Date of Birth/Sex: Jul 07, 1928 (86 y.o. F) Treating RN: [name] Primary Care Finian Helvey: Park Liter Other Clinician: Referring Kaedan Richert: Park Liter Treating Zameer Borman/Extender: [name], [name] Weeks in Treatment: 1 Vital Signs Time Taken: 12:42 Temperature (F): 97.8 Height (in): 60 Pulse (bpm): 70 Weight (lbs): 96 Respiratory Rate (breaths/min): 16 Body Mass Index (BMI): 18.7 Blood Pressure (mmHg): 166/54 Reference Range: 80 - 120 mg / dl Electronic Signature(s) Signed: 09/21/2019 11:15:10 AM By: Lorine Bears RCP, RRT, CHT Entered By: Lorine Bears on 09/17/2019 12:45:58

## 2019-09-24 ENCOUNTER — Other Ambulatory Visit: Payer: Self-pay

## 2019-09-24 ENCOUNTER — Encounter: Payer: Medicare Other | Admitting: Physician Assistant

## 2019-09-24 DIAGNOSIS — S81811A Laceration without foreign body, right lower leg, initial encounter: Secondary | ICD-10-CM | POA: Diagnosis not present

## 2019-09-24 NOTE — Progress Notes (Addendum)
JEWELZ, PAPANIA ([id number]) Visit Report for 09/24/2019 Chief Complaint Document Details Patient Name: [name], [name]. Date of Service: 09/24/2019 12:45 PM Medical Record Number: [id number] Patient Account Number: [id number] Date of Birth/Sex: 09-24-1927 (86 y.o. F) Treating RN: [name] Primary Care Provider: Park Liter Other Clinician: Referring Provider: Park Liter Treating Provider/Extender: [name], Rob Mciver Weeks in Treatment: 2 Information Obtained from: Patient Chief Complaint Right LE skin tear Electronic Signature(s) Signed: 09/24/2019 12:51:45 PM By: Worthy Keeler PA-C Entered By: Worthy Keeler on 09/24/2019 12:51:45 [place], [name] ([id number]) -------------------------------------------------------------------------------- HPI Details Patient Name: [name]. Date of Service: 09/24/2019 12:45 PM Medical Record Number: [id number] Patient Account Number: [id number] Date of Birth/Sex: [birth year] (86 y.o. F) Treating RN: [name] Primary Care Provider: Park Liter Other Clinician: Referring Provider: Park Liter Treating Provider/Extender: [name], Hayven Croy Weeks in Treatment: 2 History of Present Illness HPI Description: 02/12/18 ADMISSION This is an 86 year old woman who is recently moved to [place] from [place]. Her story began in February where she fell on some ice suffering extensive lacerations to her bilateral lower extremities. She is able to show me extensive pictures of the right lower leg but with going to a wound care center and [place] 3 times a week these eventually closed over. She has been left with 1 area on the upper lateral left calf. She has been applying Neosporin to this and applying a bandage. Currently this measures 2 x 1.5 cm. The patient is not a diabetic. She is an ex-smoker quitting 40 years ago. She does have COPD by description. ABIs in our clinic were 1.32 on the right and 1.03 on the  left 02/19/18; right lower leg wound which was initially trauma. The area that we look that last week is smaller. Still covered in a nonviable surface however. We are using Iodoflex 02/26/18; right lower leg wound which was initially trauma in the setting of chronic venous insufficiency. Surface of the wound looks much better healthy granulation advancing epithelialization. We have good edema control 03/05/18; right lower leg wound which was initially trauma in the setting of chronic venous insufficiency. She continues to make nice progress here. We have good edema control oHe arrives today with a new traumatic laceration on her right dorsal forearm which she states happened while she was putting on a sweatshirt 03/12/18; right lower leg wound as closed however it still looks vulnerable. She has chronic venous insufficiency. oThe new wound from last week a traumatic area on her right dorsal or arm unfortunately does not have a viable surface. I remove some nonviable skin and necrotic subcutaneous debris from the wound surface. Hemostasis with silver nitrate and direct pressure 03/19/18; right lower leg wound is closed. She has chronic venous insufficiency and will need ongoing compression oThe new wounds from 2 weeks ago on the right upper elbow is closed. The area on her right dorsal forearm continues to have a nonviable surface requiring debridement 03/26/18; we'll need to look into ongoing compression for her legs. 2 wounds on the right upper elbow is closed. The area on the right dorsal forearm looks better. Hydrofera Blue secondary to hypertrophic granulation 04/02/18; he has compression stockings although she did not wear them today. Severe chronic venous insufficiency. She has 2 wounds on the right upper elbow which I said were closed last week which actually are although they're very small and she has a smaller clean wound on the right dorsal forearm. We've been using Hydrofera Blue secondary to  some  upper granulation. All of this looks better 04/09/18-She is seen in follow up evaluation for a right elbow and right forearm skin tear. The right elbow is healed. We will continue with same treatment plan and she'll follow next week 04/16/18-She is seen in follow-up evaluation for right forearm skin tear, this is essentially healed. We will cover with foam border and she will follow-up next week 04/23/18; the patient arrives with a new skin tear on her right dorsal arm just below the elbow. She got this while carrying a box. She has a linear skin tear. There is no depth I don't think this should've been sutured. There is a skin flap medially I'm not sure if this will be viable or not 04/30/18; the new skin tear from last week unfortunately has remained viable in terms of the skin flap. She has a small open area that looks healthy. Using moistened silver collagen 05/07/18; right dorsal arm skin tear. Nonviable tissue over the remnants of the wound. We've been using silver collagen 05/21/18; right dorsal arm skin tear. Nonviable tissue over a small remnant of the wound was washed off. We've been using silver collagen which we will continue 06/04/18; the right dorsal arm skin tear has healed. She arrives today with a traumatic wound just above the olecranon of her left elbow. This is a skin tear with a nonviable nonadherent flap which was removed. We'll use silver collagen here. Also noteworthy she arrives without her compression stockings 06/18/18; the area just above the olecranon of her left elbow. It is smaller and generally has a healthy surface. I was surprised [name], [name]. ([id number]) to learn that the patient is actually changing this herself although. Appears this week she was putting some topical lidocaine on this that she bought over-the-counter at the drugstore. She did get supplies at home she didn't know how to put it on. She drove herself here today i.e. not eligible for home  health Readmission: 11/04/18 on evaluation today patient presents for evaluation our clinic news to include the she has on her lower extremity which occurred as the result of having hit this on a piece of furniture. This was back in December. She states that her daughter has been trying to get her to come into have this evaluated here the wound care center but she was being somewhat stubborn. Nonetheless upon evaluation today the wound really appears to be potentially healed she does have some swelling of lower extremity which I think will come into play. With that being said he tells me that even yesterday this was still making clear fluid and therefore there may be some issues with continued problems with the swelling and drainage secondary to her venous stasis. Nonetheless overall wound appears to be doing fairly well which is good news. 11/11/18 on evaluation today patient actually appears to be doing excellent in the area of question that was green just the day before he saw her last time actually appears to have not drain that always in past week. I think this is done very well and at this point though she's had some discomfort I do not see any evidence of open wound which he needs to continue see wound care. Readmission: 03/10/19 on evaluation today patient presents for reevaluation here in our clinic concerning issues that she actually is having with the new problem on her right lower leg due to trauma that occurred when a car door shut on her leg last Thursday. This is not been quite a  week and she does have several skin tears unfortunately that resulted from this injury. Fortunately there's no signs of active infection at this time. No fevers, chills, nausea, or vomiting noted at this time. She does have some hot tissue noted at this point that has me concerned about the possibility of needing to move this in order to allow the areas to heal appropriately. Fortunately there's no signs of  active infection at this time. 03/17/19-Patient returns to clinic at 1 week, we have been using Prisma on the larger wounds with Kerlix and Xeroform. As many as 5 wounds were number last time 3 of which have closed, leaving the right proximal tibial and right distal medial leg wound both of which are looking good 03/24/19 on evaluation today patient appears to be doing better in regard to her wounds at this time. Fortunately there is no sign of active infection at this time. No fevers chills noted. She's been tolerating the dressing changes without complication which is good news she's been performing these herself it sounds like home health is not going to be coming out it sounds as if they feel like she may be able to take care of yourself and not be technically homebound. Nonetheless though that is unfortunate it does seem like she's done fairly well with taking care of yourself from the standpoint of the dressings. 03/31/19 on evaluation today patient actually appears to be doing well with regard to her lower extremity ulcers. She had one area to heal today and in regard to her right lower extremity were she continues to have an open wound I did have to perform some sharp debridement today. Subsequently the patient still has an area of what appears to be possibly a small hematoma on the anterior portion of her left lower extremity. This is not open enough at this time but nonetheless does seem to show some evidence of hardening which is not necessarily a bad thing. Eventually this may pop off but again I'm not interested in really removing this prematurely. 04/07/19 on evaluation today patient actually appears to be doing well with regard to her lower extremity ulcers. She is been tolerating the dressing changes without complication. She seems to be making excellent progress which is great news. She does have one small new skin tear area but the good news is she was able to pull the skin back over  and it really looks like it is healing quite nicely. Overall that is just a very small region and again seems to be healing nicely. 04/14/19 upon evaluation today patient actually appears to be doing quite well with regard to the wounds on the right lower extremity. In fact everything appears to be healed as of today. This is excellent news. She does however have the area on the left lower extremity which is showing some signs of loosening up we have been watching this area it is no longer fluctuant and in fact with the Betadine on it it is been slowly dry now but is now lifting up on the edge. There may be a wound underneath this region. 04/21/2019 on evaluation today patient appears to be doing excellent in regard to her bilateral lower extremities. In fact she has been tolerating the dressing changes without complication does not seem to show any signs of infection at this point. In fact based on what I am seeing currently I think she is completely healed. [name], [name] 05/04/2019 on evaluation today patient actually appears to be  doing quite well with regard to her right elbow. She has been tolerating the dressing changes without complication. Fortunately there is no signs of active infection at this time. Overall I am very pleased with how things are progressing. The elbow almost appears to be completely healed though it is not 100% today. 05/14/2019 on evaluation today patient actually appears to be doing well with regard to her elbow skin tear that we have been treating in fact this appears to be completely healed. Unfortunately she has a new skin tear noted as of today which actually occurred in the last week since I previously saw her. This again is unfortunate that it has been so long because I am not going to be able to actually pull the skin over where you have to trim this away and let it heal by second intent. This new wound is in the right elbow region as well but  again separate from the original ulcer. 05/19/2019 on evaluation today patient appears to be doing well with regard to her skin tear on her elbow. She has been tolerating the dressing changes without complication. Fortunately there is no signs of active infection at this time. No fever chills noted. Overall I am very pleased with how things appear today. She is not having any significant pain which is also excellent news. 05/26/2019 on evaluation today patient actually appears to be doing well with regard to her right elbow ulcer. She has been tolerating the dressing changes without complication. Fortunately there is no signs of active infection at this time. No fevers, chills, nausea, vomiting, or diarrhea. 06/02/2019 on evaluation today patient appears to be doing very well with regard to her wound of the right elbow. In fact there is just a very small opening still remaining for the most part this is completely closed and appears to be doing excellent. There is no signs of active infection at this time which is also good news. No fevers, chills, nausea, vomiting, or diarrhea. 06/09/2019 on evaluation today patient appears to actually be doing excellent with regard to her original wound on her elbow which really appears to be completely healed at this point. With that being said she is having issues with an area generally of weeping in this region which I am unsure exactly what is causing this. Does not appear to be obviously bacterially infected although that could be part of the issue. With that being said I do believe that there is also a chance there could be a fungal infection here. 06/18/2019 on evaluation today patient's elbow ulcer appears to be doing much better I do believe this was more likely a fungal infection that was going on at the last visit and fortunately she seems to be doing much better at this point. With that being said this is not completely healed but I am hopeful will be so  by next week. She is very pleased that is really not having any pain in regard to the elbow. 06/25/2019 upon evaluation today patient appears to be doing well with regard to her elbow ulcer that we have been taking care of unfortunately she has a new skin tear on her forearm which has arisen since I last saw her. This she states resulted from a fall that she had tried to get out of her bathtub on Tuesday. Obviously this is not good news she continues to have significant falls which I am very concerned about to be perfectly honest. I explained to her that if  she is falling she runs the risk of injuring her head or potentially breaking something that could be more significant which she states she understands but she just "tries not to think about it". I explained to her that I feel like this is something that she absolutely needs to think about in order to protect herself.-Suggested she may want to consider an assisted living facility she is in an apartment complex where she does have friends she really does not want to move out I therefore told her that at minimum I would recommend that she get in touch with her daughter and attempt if at all possible to have her contact her each day which she states they typically do anyway and if she is unable to get in touch with her that she subsequently call someone to check on her mother and again if they are not able to then have a way to be able to get into her apartment to check on her in case something untoward happens. 07/09/2019 on evaluation today patient actually appears to be doing excellent. She has been tolerating the dressing changes and her wound appears to be completely healed which is great news she has no new skin tears at this point. Readmission: 09/10/2019 patient presents today for readmission in our clinic due to a skin tear that she has on the right lower extremity which actually occurred 2 days ago. She was actually seen last on July 09, 2019 when she had completely healed from her prior skin tear on her arm. Fortunately there is no signs of active infection at this time. No fever chills noted. With that being said the patient tells me that she is not having any severe pain she did try to pull this back over as much as she could. This injury occurred as a result of hitting her leg on the car door. No fevers, chills, nausea, vomiting, or diarrhea. 09/17/2019 upon evaluation today patient appears to be doing well with regard to the overall dressing changes at this point. Wise, [name] ([id number]) Fortunately there is no signs of active infection at this time. No fevers, chills, nausea, vomiting, or diarrhea. I am very pleased with how things seem to be progressing at this point. 09/24/2019 upon evaluation today patient appears to be doing well with regard to her skin tear. She has been tolerating the dressing changes without complication. Fortunately there is no signs of infection and the patient seems to be doing quite well based on what I am seeing today. Overall I am very pleased with the progress at this point. The patient likewise is very happy. She does note that she did get her border foam dressings in the mail as well. Electronic Signature(s) Signed: 09/24/2019 2:03:29 PM By: Worthy Keeler PA-C Entered By: Worthy Keeler on 09/24/2019 14:03:29 THALEIA, COZENS ([id number]) -------------------------------------------------------------------------------- Physical Exam Details Patient Name: [name], [name] Date of Service: 09/24/2019 12:45 PM Medical Record Number: [id number] Patient Account Number: [id number] Date of Birth/Sex: 03-Mar-1928 (86 y.o. F) Treating RN: [name] Primary Care Provider: Park Liter Other Clinician: Referring Provider: Park Liter Treating Provider/Extender: [name], Vikram Tillett Weeks in Treatment: 2 Constitutional Well-nourished and well-hydrated in no acute  distress. Respiratory normal breathing without difficulty. Psychiatric this patient is able to make decisions and demonstrates good insight into disease process. Alert and Oriented x 3. pleasant and cooperative. Notes Upon inspection today patient's wound bed actually showed signs of good epithelization and reattachment of the skin flap that  I was able to reapproximate. It is only the area of skin that was torn away or had to be trimmed off that is still allowing and able to granulate in. This seems to be doing quite well. Fortunately there is no signs of active infection at this time. Electronic Signature(s) Signed: 09/24/2019 2:04:02 PM By: Worthy Keeler PA-C Entered By: Worthy Keeler on 09/24/2019 14:04:01 [name], [name] ([id number]) -------------------------------------------------------------------------------- Physician Orders Details Patient Name: [name]. Date of Service: 09/24/2019 12:45 PM Medical Record Number: [id number] Patient Account Number: [id number] Date of Birth/Sex: 02/26/1928 (86 y.o. F) Treating RN: [name] Primary Care Provider: Park Liter Other Clinician: Referring Provider: Park Liter Treating Provider/Extender: [name], Ellyanna Holton Weeks in Treatment: 2 Verbal / Phone Orders: No Diagnosis Coding ICD-10 Coding Code Description I87.2 Venous insufficiency (chronic) (peripheral) S81.801A Unspecified open wound, right lower leg, initial encounter Wound Cleansing Wound #18 Right,Anterior Lower Leg o Clean wound with Normal Saline. Primary Wound Dressing Wound #18 Right,Anterior Lower Leg o Xeroform Secondary Dressing Wound #18 Right,Anterior Lower Leg o Boardered Foam Dressing Dressing Change Frequency Wound #18 Right,Anterior Lower Leg o Change dressing every other day. Electronic Signature(s) Signed: 09/24/2019 12:55:36 PM By: Worthy Keeler PA-C Entered By: Worthy Keeler on 09/24/2019 12:55:36 [name], [name] ([id number]) -------------------------------------------------------------------------------- Problem List Details Patient Name: [name]. Date of Service: 09/24/2019 12:45 PM Medical Record Number: [id number] Patient Account Number: [id number] Date of Birth/Sex: 1928-01-17 (86 y.o. F) Treating RN: [name] Primary Care Provider: Park Liter Other Clinician: Referring Provider: Park Liter Treating Provider/Extender: [name], Berenis Corter Weeks in Treatment: 2 Active Problems ICD-10 Evaluated Encounter Code Description Active Date Today Diagnosis I87.2 Venous insufficiency (chronic) (peripheral) 09/10/2019 No Yes S81.801A Unspecified open wound, right lower leg, initial encounter 09/10/2019 No Yes Inactive Problems Resolved Problems Electronic Signature(s) Signed: 09/24/2019 12:51:40 PM By: Worthy Keeler PA-C Entered By: Worthy Keeler on 09/24/2019 12:51:39 [name], [name] ([id number]) -------------------------------------------------------------------------------- Progress Note Details Patient Name: [name], [name] Date of Service: 09/24/2019 12:45 PM Medical Record Number: [id number] Patient Account Number: [id number] Date of Birth/Sex: 04/03/1928 (86 y.o. F) Treating RN: [name] Primary Care Provider: Park Liter Other Clinician: Referring Provider: Park Liter Treating Provider/Extender: [name], Caidence Kaseman Weeks in Treatment: 2 Subjective Chief Complaint Information obtained from Patient Right LE skin tear History of Present Illness (HPI) 02/12/18 ADMISSION This is an 86 year old woman who is recently moved to [place] from [place]. Her story began in February where she fell on some ice suffering extensive lacerations to her bilateral lower extremities. She is able to show me extensive pictures of the right lower leg but with going to a wound care center and [place] 3 times a week these eventually closed over. She has been left with 1  area on the upper lateral left calf. She has been applying Neosporin to this and applying a bandage. Currently this measures 2 x 1.5 cm. The patient is not a diabetic. She is an ex-smoker quitting 40 years ago. She does have COPD by description. ABIs in our clinic were 1.32 on the right and 1.03 on the left 02/19/18; right lower leg wound which was initially trauma. The area that we look that last week is smaller. Still covered in a nonviable surface however. We are using Iodoflex 02/26/18; right lower leg wound which was initially trauma in the setting of chronic venous insufficiency. Surface of the wound looks much better healthy granulation advancing epithelialization. We have good edema control 03/05/18;  right lower leg wound which was initially trauma in the setting of chronic venous insufficiency. She continues to make nice progress here. We have good edema control He arrives today with a new traumatic laceration on her right dorsal forearm which she states happened while she was putting on a sweatshirt 03/12/18; right lower leg wound as closed however it still looks vulnerable. She has chronic venous insufficiency. The new wound from last week a traumatic area on her right dorsal or arm unfortunately does not have a viable surface. I remove some nonviable skin and necrotic subcutaneous debris from the wound surface. Hemostasis with silver nitrate and direct pressure 03/19/18; right lower leg wound is closed. She has chronic venous insufficiency and will need ongoing compression The new wounds from 2 weeks ago on the right upper elbow is closed. The area on her right dorsal forearm continues to have a nonviable surface requiring debridement 03/26/18; we'll need to look into ongoing compression for her legs. 2 wounds on the right upper elbow is closed. The area on the right dorsal forearm looks better. Hydrofera Blue secondary to hypertrophic granulation 04/02/18; he has compression stockings  although she did not wear them today. Severe chronic venous insufficiency. She has 2 wounds on the right upper elbow which I said were closed last week which actually are although they're very small and she has a smaller clean wound on the right dorsal forearm. We've been using Hydrofera Blue secondary to some upper granulation. All of this looks better 04/09/18-She is seen in follow up evaluation for a right elbow and right forearm skin tear. The right elbow is healed. We will continue with same treatment plan and she'll follow next week 04/16/18-She is seen in follow-up evaluation for right forearm skin tear, this is essentially healed. We will cover with foam border and she will follow-up next week 04/23/18; the patient arrives with a new skin tear on her right dorsal arm just below the elbow. She got this while carrying a box. She has a linear skin tear. There is no depth I don't think this should've been sutured. There is a skin flap medially I'm not sure if this will be viable or not 04/30/18; the new skin tear from last week unfortunately has remained viable in terms of the skin flap. She has a small open area that looks healthy. Using moistened silver collagen 05/07/18; right dorsal arm skin tear. Nonviable tissue over the remnants of the wound. We've been using silver collagen IRESHA, LEVISON ([id number]) 05/21/18; right dorsal arm skin tear. Nonviable tissue over a small remnant of the wound was washed off. We've been using silver collagen which we will continue 06/04/18; the right dorsal arm skin tear has healed. She arrives today with a traumatic wound just above the olecranon of her left elbow. This is a skin tear with a nonviable nonadherent flap which was removed. We'll use silver collagen here. Also noteworthy she arrives without her compression stockings 06/18/18; the area just above the olecranon of her left elbow. It is smaller and generally has a healthy surface. I was  surprised to learn that the patient is actually changing this herself although. Appears this week she was putting some topical lidocaine on this that she bought over-the-counter at the drugstore. She did get supplies at home she didn't know how to put it on. She drove herself here today i.e. not eligible for home health Readmission: 11/04/18 on evaluation today patient presents for evaluation our clinic news to include the  she has on her lower extremity which occurred as the result of having hit this on a piece of furniture. This was back in December. She states that her daughter has been trying to get her to come into have this evaluated here the wound care center but she was being somewhat stubborn. Nonetheless upon evaluation today the wound really appears to be potentially healed she does have some swelling of lower extremity which I think will come into play. With that being said he tells me that even yesterday this was still making clear fluid and therefore there may be some issues with continued problems with the swelling and drainage secondary to her venous stasis. Nonetheless overall wound appears to be doing fairly well which is good news. 11/11/18 on evaluation today patient actually appears to be doing excellent in the area of question that was green just the day before he saw her last time actually appears to have not drain that always in past week. I think this is done very well and at this point though she's had some discomfort I do not see any evidence of open wound which he needs to continue see wound care. Readmission: 03/10/19 on evaluation today patient presents for reevaluation here in our clinic concerning issues that she actually is having with the new problem on her right lower leg due to trauma that occurred when a car door shut on her leg last Thursday. This is not been quite a week and she does have several skin tears unfortunately that resulted from this injury. Fortunately  there's no signs of active infection at this time. No fevers, chills, nausea, or vomiting noted at this time. She does have some hot tissue noted at this point that has me concerned about the possibility of needing to move this in order to allow the areas to heal appropriately. Fortunately there's no signs of active infection at this time. 03/17/19-Patient returns to clinic at 1 week, we have been using Prisma on the larger wounds with Kerlix and Xeroform. As many as 5 wounds were number last time 3 of which have closed, leaving the right proximal tibial and right distal medial leg wound both of which are looking good 03/24/19 on evaluation today patient appears to be doing better in regard to her wounds at this time. Fortunately there is no sign of active infection at this time. No fevers chills noted. She's been tolerating the dressing changes without complication which is good news she's been performing these herself it sounds like home health is not going to be coming out it sounds as if they feel like she may be able to take care of yourself and not be technically homebound. Nonetheless though that is unfortunate it does seem like she's done fairly well with taking care of yourself from the standpoint of the dressings. 03/31/19 on evaluation today patient actually appears to be doing well with regard to her lower extremity ulcers. She had one area to heal today and in regard to her right lower extremity were she continues to have an open wound I did have to perform some sharp debridement today. Subsequently the patient still has an area of what appears to be possibly a small hematoma on the anterior portion of her left lower extremity. This is not open enough at this time but nonetheless does seem to show some evidence of hardening which is not necessarily a bad thing. Eventually this may pop off but again I'm not interested in really removing this prematurely.  04/07/19 on evaluation today patient  actually appears to be doing well with regard to her lower extremity ulcers. She is been tolerating the dressing changes without complication. She seems to be making excellent progress which is great news. She does have one small new skin tear area but the good news is she was able to pull the skin back over and it really looks like it is healing quite nicely. Overall that is just a very small region and again seems to be healing nicely. 04/14/19 upon evaluation today patient actually appears to be doing quite well with regard to the wounds on the right lower extremity. In fact everything appears to be healed as of today. This is excellent news. She does however have the area on the left lower extremity which is showing some signs of loosening up we have been watching this area it is no longer fluctuant and in fact with the Betadine on it it is been slowly dry now but is now lifting up on the edge. There may be a wound underneath [name], [name] this region. 04/21/2019 on evaluation today patient appears to be doing excellent in regard to her bilateral lower extremities. In fact she has been tolerating the dressing changes without complication does not seem to show any signs of infection at this point. In fact based on what I am seeing currently I think she is completely healed. 05/04/2019 on evaluation today patient actually appears to be doing quite well with regard to her right elbow. She has been tolerating the dressing changes without complication. Fortunately there is no signs of active infection at this time. Overall I am very pleased with how things are progressing. The elbow almost appears to be completely healed though it is not 100% today. 05/14/2019 on evaluation today patient actually appears to be doing well with regard to her elbow skin tear that we have been treating in fact this appears to be completely healed. Unfortunately she has a new skin tear noted as of today  which actually occurred in the last week since I previously saw her. This again is unfortunate that it has been so long because I am not going to be able to actually pull the skin over where you have to trim this away and let it heal by second intent. This new wound is in the right elbow region as well but again separate from the original ulcer. 05/19/2019 on evaluation today patient appears to be doing well with regard to her skin tear on her elbow. She has been tolerating the dressing changes without complication. Fortunately there is no signs of active infection at this time. No fever chills noted. Overall I am very pleased with how things appear today. She is not having any significant pain which is also excellent news. 05/26/2019 on evaluation today patient actually appears to be doing well with regard to her right elbow ulcer. She has been tolerating the dressing changes without complication. Fortunately there is no signs of active infection at this time. No fevers, chills, nausea, vomiting, or diarrhea. 06/02/2019 on evaluation today patient appears to be doing very well with regard to her wound of the right elbow. In fact there is just a very small opening still remaining for the most part this is completely closed and appears to be doing excellent. There is no signs of active infection at this time which is also good news. No fevers, chills, nausea, vomiting, or diarrhea. 06/09/2019 on evaluation today patient appears to actually  be doing excellent with regard to her original wound on her elbow which really appears to be completely healed at this point. With that being said she is having issues with an area generally of weeping in this region which I am unsure exactly what is causing this. Does not appear to be obviously bacterially infected although that could be part of the issue. With that being said I do believe that there is also a chance there could be a fungal infection  here. 06/18/2019 on evaluation today patient's elbow ulcer appears to be doing much better I do believe this was more likely a fungal infection that was going on at the last visit and fortunately she seems to be doing much better at this point. With that being said this is not completely healed but I am hopeful will be so by next week. She is very pleased that is really not having any pain in regard to the elbow. 06/25/2019 upon evaluation today patient appears to be doing well with regard to her elbow ulcer that we have been taking care of unfortunately she has a new skin tear on her forearm which has arisen since I last saw her. This she states resulted from a fall that she had tried to get out of her bathtub on Tuesday. Obviously this is not good news she continues to have significant falls which I am very concerned about to be perfectly honest. I explained to her that if she is falling she runs the risk of injuring her head or potentially breaking something that could be more significant which she states she understands but she just "tries not to think about it". I explained to her that I feel like this is something that she absolutely needs to think about in order to protect herself.-Suggested she may want to consider an assisted living facility she is in an apartment complex where she does have friends she really does not want to move out I therefore told her that at minimum I would recommend that she get in touch with her daughter and attempt if at all possible to have her contact her each day which she states they typically do anyway and if she is unable to get in touch with her that she subsequently call someone to check on her mother and again if they are not able to then have a way to be able to get into her apartment to check on her in case something untoward happens. 07/09/2019 on evaluation today patient actually appears to be doing excellent. She has been tolerating the dressing  changes and her wound appears to be completely healed which is great news she has no new skin tears at this point. Readmission: 09/10/2019 patient presents today for readmission in our clinic due to a skin tear that she has on the right lower extremity which [name], [name]. ([id number]) actually occurred 2 days ago. She was actually seen last on July 09, 2019 when she had completely healed from her prior skin tear on her arm. Fortunately there is no signs of active infection at this time. No fever chills noted. With that being said the patient tells me that she is not having any severe pain she did try to pull this back over as much as she could. This injury occurred as a result of hitting her leg on the car door. No fevers, chills, nausea, vomiting, or diarrhea. 09/17/2019 upon evaluation today patient appears to be doing well with regard to the overall dressing  changes at this point. Fortunately there is no signs of active infection at this time. No fevers, chills, nausea, vomiting, or diarrhea. I am very pleased with how things seem to be progressing at this point. 09/24/2019 upon evaluation today patient appears to be doing well with regard to her skin tear. She has been tolerating the dressing changes without complication. Fortunately there is no signs of infection and the patient seems to be doing quite well based on what I am seeing today. Overall I am very pleased with the progress at this point. The patient likewise is very happy. She does note that she did get her border foam dressings in the mail as well. Objective Constitutional Well-nourished and well-hydrated in no acute distress. Respiratory normal breathing without difficulty. Psychiatric this patient is able to make decisions and demonstrates good insight into disease process. Alert and Oriented x 3. pleasant and cooperative. General Notes: Upon inspection today patient's wound bed actually showed signs of good  epithelization and reattachment of the skin flap that I was able to reapproximate. It is only the area of skin that was torn away or had to be trimmed off that is still allowing and able to granulate in. This seems to be doing quite well. Fortunately there is no signs of active infection at this time. Integumentary (Hair, Skin) Wound #18 status is Open. Original cause of wound was Trauma. The wound is located on the Right,Anterior Lower Leg. The wound measures 2.1cm length x 0.2cm width x 0.1cm depth; 0.33cm^2 area and 0.033cm^3 volume. There is Fat Layer (Subcutaneous Tissue) Exposed exposed. There is no tunneling or undermining noted. There is a medium amount of sanguinous drainage noted. The wound margin is flat and intact. There is medium (34-66%) pink granulation within the wound bed. There is a small (1-33%) amount of necrotic tissue within the wound bed including Adherent Slough. Assessment Active Problems ICD-10 Venous insufficiency (chronic) (peripheral) Unspecified open wound, right lower leg, initial encounter Jha, Galina H. ([id number]) Plan Wound Cleansing: Wound #18 Right,Anterior Lower Leg: Clean wound with Normal Saline. Primary Wound Dressing: Wound #18 Right,Anterior Lower Leg: Xeroform Secondary Dressing: Wound #18 Right,Anterior Lower Leg: Boardered Foam Dressing Dressing Change Frequency: Wound #18 Right,Anterior Lower Leg: Change dressing every other day. 1. Upon inspection today patient appears to be doing very well with regard to her wound. Recommend we continue with the Xeroform at this time. 2. I am also going to suggest that regarding continue with the bordered foam dressing to cover she did get this in the mail and I think that would do very well for her. 3. I did recommend as well she continue to be very careful as far as bumps and hits obviously her skin is extremely fragile. We will see patient back for reevaluation in 1 week here in the clinic.  If anything worsens or changes patient will contact our office for additional recommendations. Electronic Signature(s) Signed: 09/24/2019 2:06:09 PM By: Worthy Keeler PA-C Entered By: Worthy Keeler on 09/24/2019 14:06:09 Bour, Sharese Lemmie Evens ([id number]) -------------------------------------------------------------------------------- SuperBill Details Patient Name: [name]. Date of Service: 09/24/2019 Medical Record Number: [id number] Patient Account Number: [id number] Date of Birth/Sex: May 26, 1928 (86 y.o. F) Treating RN: [name] Primary Care Provider: Park Liter Other Clinician: Referring Provider: Park Liter Treating Provider/Extender: [name], Aevah Stansbery Weeks in Treatment: 2 Diagnosis Coding ICD-10 Codes Code Description I87.2 Venous insufficiency (chronic) (peripheral) S81.801A Unspecified open wound, right lower leg, initial encounter Physician Procedures CPT4 Code: [id number] Description: [id number] - WC  PHYS LEVEL 3 - EST PT ICD-10 Diagnosis Description I87.2 Venous insufficiency (chronic) (peripheral) S81.801A Unspecified open wound, right lower leg, initial encounte Modifier: r Quantity: 1 Electronic Signature(s) Signed: 09/24/2019 2:06:21 PM By: Worthy Keeler PA-C Entered By: Worthy Keeler on 09/24/2019 14:06:20

## 2019-09-24 NOTE — Progress Notes (Signed)
[name], [name] ([id number]) Visit Report for 09/24/2019 Arrival Information Details Patient Name: [name], [name]. Date of Service: 09/24/2019 12:45 PM Medical Record Number: [id number] Patient Account Number: [id number] Date of Birth/Sex: 1928-07-15 (86 y.o. F) Treating RN: [name] Barman Primary Care Mandela Bello: Park Liter Other Clinician: Referring Jearld Hemp: Park Liter Treating Khyre Germond/Extender: [name], [name] Weeks in Treatment: 2 Visit Information History Since Last Visit All ordered tests and consults were completed: No Patient Arrived: Ambulatory Added or deleted any medications: No Arrival Time: 12:42 Any new allergies or adverse reactions: No Accompanied By: self Had a fall or experienced change in No Transfer Assistance: None activities of daily living that may affect Patient Identification Verified: Yes risk of falls: Secondary Verification Process Completed: Yes Signs or symptoms of abuse/neglect since last visito No Hospitalized since last visit: No Has Dressing in Place as Prescribed: Yes Pain Present Now: No Electronic Signature(s) Signed: 09/24/2019 5:20:48 PM By: Gretta Cool, BSN, RN, CWS, Kim RN, BSN Entered By: Gretta Cool, BSN, RN, CWS, Kim on 09/24/2019 12:43:26 [name], [name] ([id number]) -------------------------------------------------------------------------------- Encounter Discharge Information Details Patient Name: Wise, [name] Date of Service: 09/24/2019 12:45 PM Medical Record Number: [id number] Patient Account Number: [id number] Date of Birth/Sex: 1928/01/17 (86 y.o. F) Treating RN: [name] Primary Care Vitor Overbaugh: Park Liter Other Clinician: Referring Sharnelle Cappelli: Park Liter Treating Shelbe Haglund/Extender: [name], [name] Weeks in Treatment: 2 Encounter Discharge Information Items Discharge Condition: Stable Ambulatory Status: Ambulatory Discharge Destination: Home Transportation: Private Auto Accompanied By: self Schedule  Follow-up Appointment: Yes Clinical Summary of Care: Electronic Signature(s) Signed: 09/24/2019 4:17:07 PM By: [name] Entered By: [name] on 09/24/2019 14:39:02 [place], Hunt. ([id number]) -------------------------------------------------------------------------------- Lower Extremity Assessment Details Patient Name: Wise, [name] Date of Service: 09/24/2019 12:45 PM Medical Record Number: [id number] Patient Account Number: [id number] Date of Birth/Sex: Oct 08, 1927 (86 y.o. F) Treating RN: [name] Barman Primary Care Mickal Meno: Park Liter Other Clinician: Referring Dalayla Aldredge: Park Liter Treating Gianny Sabino/Extender: [name], [name] Weeks in Treatment: 2 Edema Assessment Assessed: [Left: No] [Right: Yes] Edema: [Left: N] [Right: o] Vascular Assessment Pulses: Dorsalis Pedis Palpable: [Right:Yes] Electronic Signature(s) Signed: 09/24/2019 5:20:48 PM By: Gretta Cool, BSN, RN, CWS, Kim RN, BSN Entered By: Gretta Cool, BSN, RN, CWS, Kim on 09/24/2019 12:47:35 [name], [name] ([id number]) -------------------------------------------------------------------------------- Multi Wound Chart Details Patient Name: Carol Games. Date of Service: 09/24/2019 12:45 PM Medical Record Number: [id number] Patient Account Number: [id number] Date of Birth/Sex: Mar 04, 1928 (86 y.o. F) Treating RN: [name] Primary Care Ruey Storer: Park Liter Other Clinician: Referring Han Lysne: Park Liter Treating Sheyanne Munley/Extender: [name], [name] Weeks in Treatment: 2 Photos: [N/A:N/A] Wound Location: Right Lower Leg - Anterior N/A N/A Wounding Event: Trauma N/A N/A Primary Etiology: Trauma, Other N/A N/A Comorbid History: Cataracts, Glaucoma, Asthma, N/A N/A Chronic Obstructive Pulmonary Disease (COPD), Arrhythmia, Hypertension Date Acquired: 09/08/2019 N/A N/A Weeks of Treatment: 2 N/A N/A Wound Status: Open N/A N/A Measurements L x W x D 2.1x0.2x0.1 N/A N/A (cm) Area (cm) :  0.33 N/A N/A Volume (cm) : 0.033 N/A N/A % Reduction in Area: 94.40% N/A N/A % Reduction in Volume: 94.40% N/A N/A Classification: Full Thickness Without N/A N/A Exposed Support Structures Exudate Amount: Medium N/A N/A Exudate Type: Sanguinous N/A N/A Exudate Color: red N/A N/A Wound Margin: Flat and Intact N/A N/A Granulation Amount: Medium (34-66%) N/A N/A Granulation Quality: Pink N/A N/A Necrotic Amount: Small (1-33%) N/A N/A Exposed Structures: Fat Layer (Subcutaneous N/A N/A Tissue) Exposed: Yes Fascia: No Tendon: No Muscle: No Joint: No Bone: No Epithelialization: None N/A  N/A Treatment Notes [name], [name] ([id number]) Electronic Signature(s) Signed: 09/24/2019 4:17:07 PM By: [name] Entered By: [name] on 09/24/2019 14:37:45 [name], [name] ([id number]) -------------------------------------------------------------------------------- [name] Details Patient Name: Carol Games. Date of Service: 09/24/2019 12:45 PM Medical Record Number: [id number] Patient Account Number: [id number] Date of Birth/Sex: 08/21/1928 (86 y.o. F) Treating RN: [name] Primary Care Julieanne Hadsall: Park Liter Other Clinician: Referring Kharee Lesesne: Park Liter Treating Zari Cly/Extender: [name], [name] Weeks in Treatment: 2 Active Inactive Orientation to the Wound Care Program Nursing Diagnoses: Knowledge deficit related to the wound healing center program Goals: Patient/caregiver will verbalize understanding of the [facility] Program Date Initiated: 09/10/2019 Target Resolution Date: 10/09/2019 Goal Status: Active Interventions: Provide education on orientation to the wound center Notes: Wound/Skin Impairment Nursing Diagnoses: Impaired tissue integrity Goals: Ulcer/skin breakdown will have a volume reduction of 30% by week 4 Date Initiated: 09/10/2019 Target Resolution Date: 10/08/2019 Goal Status:  Active Interventions: Assess ulceration(s) every visit Notes: Electronic Signature(s) Signed: 09/24/2019 4:17:07 PM By: [name] Entered By: [name] on 09/24/2019 14:37:32 [name], [name] ([id number]) -------------------------------------------------------------------------------- Pain Assessment Details Patient Name: Carol Games. Date of Service: 09/24/2019 12:45 PM Medical Record Number: [id number] Patient Account Number: [id number] Date of Birth/Sex: September 27, 1927 (86 y.o. F) Treating RN: [name] Barman Primary Care Mahsa Hanser: Park Liter Other Clinician: Referring Breeze Angell: Park Liter Treating Mirl Hillery/Extender: [name], [name] Weeks in Treatment: 2 Active Problems Location of Pain Severity and Description of Pain Patient Has Paino No Site Locations Pain Management and Medication Current Pain Management: Electronic Signature(s) Signed: 09/24/2019 5:20:48 PM By: Gretta Cool, BSN, RN, CWS, Kim RN, BSN Entered By: Gretta Cool, BSN, RN, CWS, Kim on 09/24/2019 12:43:32 [name], [name] ([id number]) -------------------------------------------------------------------------------- Patient/Caregiver Education Details Patient Name: Carol Games. Date of Service: 09/24/2019 12:45 PM Medical Record Number: [id number] Patient Account Number: [id number] Date of Birth/Gender: 02/03/1928 (86 y.o. F) Treating RN: [name] Primary Care Physician: Park Liter Other Clinician: Referring Physician: Park Liter Treating Physician/Extender: Sharalyn Ink in Treatment: 2 Education Assessment Education Provided To: Patient Education Topics Provided Wound/Skin Impairment: Handouts: Caring for Your Ulcer Methods: Demonstration, Explain/Verbal Responses: State content correctly Electronic Signature(s) Signed: 09/24/2019 4:17:07 PM By: [name] Entered By: [name] on 09/24/2019 14:38:00 [name], [name]  ([id number]) -------------------------------------------------------------------------------- Wound Assessment Details Patient Name: Esham, Iliyah H. Date of Service: 09/24/2019 12:45 PM Medical Record Number: [id number] Patient Account Number: [id number] Date of Birth/Sex: 01-29-1928 (86 y.o. F) Treating RN: [name] Barman Primary Care Dante Cooter: Park Liter Other Clinician: Referring Bernese Doffing: Park Liter Treating Rashawn Rayman/Extender: [name], [name] Weeks in Treatment: 2 Wound Status Wound Number: [id number] Primary Trauma, Other Etiology: Wound Location: Right Lower Leg - Anterior Wound Open Wounding Event: Trauma Status: Date Acquired: 09/08/2019 Comorbid Cataracts, Glaucoma, Asthma, Chronic Weeks Of Treatment: 2 History: Obstructive Pulmonary Disease (COPD), Clustered Wound: No Arrhythmia, Hypertension Photos Wound Measurements Length: (cm) 2.1 Width: (cm) 0.2 Depth: (cm) 0.1 Area: (cm) 0.33 Volume: (cm) 0.033 % Reduction in Area: 94.4% % Reduction in Volume: 94.4% Epithelialization: None Tunneling: No Undermining: No Wound Description Full Thickness Without Exposed Support Foul Odo Classification: Structures Slough/F Wound Margin: Flat and Intact Exudate Medium Amount: Exudate Type: Sanguinous Exudate Color: red r After Cleansing: No ibrino Yes Wound Bed Granulation Amount: Medium (34-66%) Exposed Structure Granulation Quality: Pink Fascia Exposed: No Necrotic Amount: Small (1-33%) Fat Layer (Subcutaneous Tissue) Exposed: Yes Necrotic Quality: Adherent Slough Tendon Exposed: No Muscle Exposed: No Joint Exposed: No Bone Exposed: No [place], Hendrix H. ([id number])  Treatment Notes Wound #18 (Right, Anterior Lower Leg) Notes xeroform, BFD Electronic Signature(s) Signed: 09/24/2019 5:20:48 PM By: Gretta Cool, BSN, RN, CWS, Kim RN, BSN Entered By: Gretta Cool, BSN, RN, CWS, Kim on 09/24/2019 12:46:39

## 2019-10-01 ENCOUNTER — Encounter: Payer: Medicare Other | Admitting: Physician Assistant

## 2019-10-01 ENCOUNTER — Other Ambulatory Visit: Payer: Self-pay

## 2019-10-01 DIAGNOSIS — I10 Essential (primary) hypertension: Secondary | ICD-10-CM | POA: Diagnosis not present

## 2019-10-01 DIAGNOSIS — S81801A Unspecified open wound, right lower leg, initial encounter: Secondary | ICD-10-CM | POA: Diagnosis not present

## 2019-10-01 DIAGNOSIS — J449 Chronic obstructive pulmonary disease, unspecified: Secondary | ICD-10-CM | POA: Diagnosis not present

## 2019-10-01 DIAGNOSIS — I4891 Unspecified atrial fibrillation: Secondary | ICD-10-CM | POA: Diagnosis not present

## 2019-10-01 DIAGNOSIS — Z87891 Personal history of nicotine dependence: Secondary | ICD-10-CM | POA: Diagnosis not present

## 2019-10-01 DIAGNOSIS — S81811A Laceration without foreign body, right lower leg, initial encounter: Secondary | ICD-10-CM | POA: Diagnosis not present

## 2019-10-01 DIAGNOSIS — I872 Venous insufficiency (chronic) (peripheral): Secondary | ICD-10-CM | POA: Diagnosis not present

## 2019-10-01 NOTE — Progress Notes (Signed)
LACE, CRUEY ([id number]) Visit Report for 10/01/2019 Chief Complaint Document Details Patient Name: [name], [name]. Date of Service: 10/01/2019 1:45 PM Medical Record Number: [id number] Patient Account Number: [id number] Date of Birth/Sex: 13-Dec-1927 (86 y.o. F) Treating RN: [name] Primary Care Provider: Park Liter Other Clinician: Referring Provider: Park Liter Treating Provider/Extender: [name], Salisa Broz Weeks in Treatment: 3 Information Obtained from: Patient Chief Complaint Right LE skin tear Electronic Signature(s) Signed: 10/01/2019 2:13:26 PM By: Worthy Keeler PA-C Entered By: Worthy Keeler on 10/01/2019 14:13:26 [name], [name]. ([id number]) -------------------------------------------------------------------------------- HPI Details Patient Name: [name]. Date of Service: 10/01/2019 1:45 PM Medical Record Number: [id number] Patient Account Number: [id number] Date of Birth/Sex: 04-05-1928 (86 y.o. F) Treating RN: [name] Primary Care Provider: Park Liter Other Clinician: Referring Provider: Park Liter Treating Provider/Extender: [name], Darsha Zumstein Weeks in Treatment: 3 History of Present Illness HPI Description: 02/12/18 ADMISSION This is an 86 year old woman who is recently moved to [place] from [place]. Her story began in February where she fell on some ice suffering extensive lacerations to her bilateral lower extremities. She is able to show me extensive pictures of the right lower leg but with going to a wound care center and [place] 3 times a week these eventually closed over. She has been left with 1 area on the upper lateral left calf. She has been applying Neosporin to this and applying a bandage. Currently this measures 2 x 1.5 cm. The patient is not a diabetic. She is an ex-smoker quitting 40 years ago. She does have COPD by description. ABIs in our clinic were 1.32 on the right and 1.03 on the left 02/19/18;  right lower leg wound which was initially trauma. The area that we look that last week is smaller. Still covered in a nonviable surface however. We are using Iodoflex 02/26/18; right lower leg wound which was initially trauma in the setting of chronic venous insufficiency. Surface of the wound looks much better healthy granulation advancing epithelialization. We have good edema control 03/05/18; right lower leg wound which was initially trauma in the setting of chronic venous insufficiency. She continues to make nice progress here. We have good edema control oHe arrives today with a new traumatic laceration on her right dorsal forearm which she states happened while she was putting on a sweatshirt 03/12/18; right lower leg wound as closed however it still looks vulnerable. She has chronic venous insufficiency. oThe new wound from last week a traumatic area on her right dorsal or arm unfortunately does not have a viable surface. I remove some nonviable skin and necrotic subcutaneous debris from the wound surface. Hemostasis with silver nitrate and direct pressure 03/19/18; right lower leg wound is closed. She has chronic venous insufficiency and will need ongoing compression oThe new wounds from 2 weeks ago on the right upper elbow is closed. The area on her right dorsal forearm continues to have a nonviable surface requiring debridement 03/26/18; we'll need to look into ongoing compression for her legs. 2 wounds on the right upper elbow is closed. The area on the right dorsal forearm looks better. Hydrofera Blue secondary to hypertrophic granulation 04/02/18; he has compression stockings although she did not wear them today. Severe chronic venous insufficiency. She has 2 wounds on the right upper elbow which I said were closed last week which actually are although they're very small and she has a smaller clean wound on the right dorsal forearm. We've been using Hydrofera Blue secondary to some  upper granulation. All of this looks better 04/09/18-She is seen in follow up evaluation for a right elbow and right forearm skin tear. The right elbow is healed. We will continue with same treatment plan and she'll follow next week 04/16/18-She is seen in follow-up evaluation for right forearm skin tear, this is essentially healed. We will cover with foam border and she will follow-up next week 04/23/18; the patient arrives with a new skin tear on her right dorsal arm just below the elbow. She got this while carrying a box. She has a linear skin tear. There is no depth I don't think this should've been sutured. There is a skin flap medially I'm not sure if this will be viable or not 04/30/18; the new skin tear from last week unfortunately has remained viable in terms of the skin flap. She has a small open area that looks healthy. Using moistened silver collagen 05/07/18; right dorsal arm skin tear. Nonviable tissue over the remnants of the wound. We've been using silver collagen 05/21/18; right dorsal arm skin tear. Nonviable tissue over a small remnant of the wound was washed off. We've been using silver collagen which we will continue 06/04/18; the right dorsal arm skin tear has healed. She arrives today with a traumatic wound just above the olecranon of her left elbow. This is a skin tear with a nonviable nonadherent flap which was removed. We'll use silver collagen here. Also noteworthy she arrives without her compression stockings 06/18/18; the area just above the olecranon of her left elbow. It is smaller and generally has a healthy surface. I was surprised [name], [name]. ([id number]) to learn that the patient is actually changing this herself although. Appears this week she was putting some topical lidocaine on this that she bought over-the-counter at the drugstore. She did get supplies at home she didn't know how to put it on. She drove herself here today i.e. not eligible for home  health Readmission: 11/04/18 on evaluation today patient presents for evaluation our clinic news to include the she has on her lower extremity which occurred as the result of having hit this on a piece of furniture. This was back in December. She states that her daughter has been trying to get her to come into have this evaluated here the wound care center but she was being somewhat stubborn. Nonetheless upon evaluation today the wound really appears to be potentially healed she does have some swelling of lower extremity which I think will come into play. With that being said he tells me that even yesterday this was still making clear fluid and therefore there may be some issues with continued problems with the swelling and drainage secondary to her venous stasis. Nonetheless overall wound appears to be doing fairly well which is good news. 11/11/18 on evaluation today patient actually appears to be doing excellent in the area of question that was green just the day before he saw her last time actually appears to have not drain that always in past week. I think this is done very well and at this point though she's had some discomfort I do not see any evidence of open wound which he needs to continue see wound care. Readmission: 03/10/19 on evaluation today patient presents for reevaluation here in our clinic concerning issues that she actually is having with the new problem on her right lower leg due to trauma that occurred when a car door shut on her leg last Thursday. This is not been quite a  week and she does have several skin tears unfortunately that resulted from this injury. Fortunately there's no signs of active infection at this time. No fevers, chills, nausea, or vomiting noted at this time. She does have some hot tissue noted at this point that has me concerned about the possibility of needing to move this in order to allow the areas to heal appropriately. Fortunately there's no signs of  active infection at this time. 03/17/19-Patient returns to clinic at 1 week, we have been using Prisma on the larger wounds with Kerlix and Xeroform. As many as 5 wounds were number last time 3 of which have closed, leaving the right proximal tibial and right distal medial leg wound both of which are looking good 03/24/19 on evaluation today patient appears to be doing better in regard to her wounds at this time. Fortunately there is no sign of active infection at this time. No fevers chills noted. She's been tolerating the dressing changes without complication which is good news she's been performing these herself it sounds like home health is not going to be coming out it sounds as if they feel like she may be able to take care of yourself and not be technically homebound. Nonetheless though that is unfortunate it does seem like she's done fairly well with taking care of yourself from the standpoint of the dressings. 03/31/19 on evaluation today patient actually appears to be doing well with regard to her lower extremity ulcers. She had one area to heal today and in regard to her right lower extremity were she continues to have an open wound I did have to perform some sharp debridement today. Subsequently the patient still has an area of what appears to be possibly a small hematoma on the anterior portion of her left lower extremity. This is not open enough at this time but nonetheless does seem to show some evidence of hardening which is not necessarily a bad thing. Eventually this may pop off but again I'm not interested in really removing this prematurely. 04/07/19 on evaluation today patient actually appears to be doing well with regard to her lower extremity ulcers. She is been tolerating the dressing changes without complication. She seems to be making excellent progress which is great news. She does have one small new skin tear area but the good news is she was able to pull the skin back over  and it really looks like it is healing quite nicely. Overall that is just a very small region and again seems to be healing nicely. 04/14/19 upon evaluation today patient actually appears to be doing quite well with regard to the wounds on the right lower extremity. In fact everything appears to be healed as of today. This is excellent news. She does however have the area on the left lower extremity which is showing some signs of loosening up we have been watching this area it is no longer fluctuant and in fact with the Betadine on it it is been slowly dry now but is now lifting up on the edge. There may be a wound underneath this region. 04/21/2019 on evaluation today patient appears to be doing excellent in regard to her bilateral lower extremities. In fact she has been tolerating the dressing changes without complication does not seem to show any signs of infection at this point. In fact based on what I am seeing currently I think she is completely healed. [name], [name] 05/04/2019 on evaluation today patient actually appears to be  doing quite well with regard to her right elbow. She has been tolerating the dressing changes without complication. Fortunately there is no signs of active infection at this time. Overall I am very pleased with how things are progressing. The elbow almost appears to be completely healed though it is not 100% today. 05/14/2019 on evaluation today patient actually appears to be doing well with regard to her elbow skin tear that we have been treating in fact this appears to be completely healed. Unfortunately she has a new skin tear noted as of today which actually occurred in the last week since I previously saw her. This again is unfortunate that it has been so long because I am not going to be able to actually pull the skin over where you have to trim this away and let it heal by second intent. This new wound is in the right elbow region as well but  again separate from the original ulcer. 05/19/2019 on evaluation today patient appears to be doing well with regard to her skin tear on her elbow. She has been tolerating the dressing changes without complication. Fortunately there is no signs of active infection at this time. No fever chills noted. Overall I am very pleased with how things appear today. She is not having any significant pain which is also excellent news. 05/26/2019 on evaluation today patient actually appears to be doing well with regard to her right elbow ulcer. She has been tolerating the dressing changes without complication. Fortunately there is no signs of active infection at this time. No fevers, chills, nausea, vomiting, or diarrhea. 06/02/2019 on evaluation today patient appears to be doing very well with regard to her wound of the right elbow. In fact there is just a very small opening still remaining for the most part this is completely closed and appears to be doing excellent. There is no signs of active infection at this time which is also good news. No fevers, chills, nausea, vomiting, or diarrhea. 06/09/2019 on evaluation today patient appears to actually be doing excellent with regard to her original wound on her elbow which really appears to be completely healed at this point. With that being said she is having issues with an area generally of weeping in this region which I am unsure exactly what is causing this. Does not appear to be obviously bacterially infected although that could be part of the issue. With that being said I do believe that there is also a chance there could be a fungal infection here. 06/18/2019 on evaluation today patient's elbow ulcer appears to be doing much better I do believe this was more likely a fungal infection that was going on at the last visit and fortunately she seems to be doing much better at this point. With that being said this is not completely healed but I am hopeful will be so  by next week. She is very pleased that is really not having any pain in regard to the elbow. 06/25/2019 upon evaluation today patient appears to be doing well with regard to her elbow ulcer that we have been taking care of unfortunately she has a new skin tear on her forearm which has arisen since I last saw her. This she states resulted from a fall that she had tried to get out of her bathtub on Tuesday. Obviously this is not good news she continues to have significant falls which I am very concerned about to be perfectly honest. I explained to her that if  she is falling she runs the risk of injuring her head or potentially breaking something that could be more significant which she states she understands but she just "tries not to think about it". I explained to her that I feel like this is something that she absolutely needs to think about in order to protect herself.-Suggested she may want to consider an assisted living facility she is in an apartment complex where she does have friends she really does not want to move out I therefore told her that at minimum I would recommend that she get in touch with her daughter and attempt if at all possible to have her contact her each day which she states they typically do anyway and if she is unable to get in touch with her that she subsequently call someone to check on her mother and again if they are not able to then have a way to be able to get into her apartment to check on her in case something untoward happens. 07/09/2019 on evaluation today patient actually appears to be doing excellent. She has been tolerating the dressing changes and her wound appears to be completely healed which is great news she has no new skin tears at this point. Readmission: 09/10/2019 patient presents today for readmission in our clinic due to a skin tear that she has on the right lower extremity which actually occurred 2 days ago. She was actually seen last on July 09, 2019 when she had completely healed from her prior skin tear on her arm. Fortunately there is no signs of active infection at this time. No fever chills noted. With that being said the patient tells me that she is not having any severe pain she did try to pull this back over as much as she could. This injury occurred as a result of hitting her leg on the car door. No fevers, chills, nausea, vomiting, or diarrhea. 09/17/2019 upon evaluation today patient appears to be doing well with regard to the overall dressing changes at this point. [name], [name]. ([id number]) Fortunately there is no signs of active infection at this time. No fevers, chills, nausea, vomiting, or diarrhea. I am very pleased with how things seem to be progressing at this point. 09/24/2019 upon evaluation today patient appears to be doing well with regard to her skin tear. She has been tolerating the dressing changes without complication. Fortunately there is no signs of infection and the patient seems to be doing quite well based on what I am seeing today. Overall I am very pleased with the progress at this point. The patient likewise is very happy. She does note that she did get her border foam dressings in the mail as well. 10/01/2019 on evaluation today patient actually appears to be completely healed upon inspection today. This is excellent news. Fortunately there is no evidence of active infection at this time. She has done excellent. Electronic Signature(s) Signed: 10/01/2019 4:45:09 PM By: Worthy Keeler PA-C Entered By: Worthy Keeler on 10/01/2019 16:45:08 Bargo, [name] ([id number]) -------------------------------------------------------------------------------- Physical Exam Details Patient Name: Wiemers, Lula Wise. Date of Service: 10/01/2019 1:45 PM Medical Record Number: [id number] Patient Account Number: [id number] Date of Birth/Sex: 1928-04-20 (86 y.o. F) Treating RN: [name] Primary Care  Provider: Park Liter Other Clinician: Referring Provider: Park Liter Treating Provider/Extender: [name], Jeyson Deshotel Weeks in Treatment: 3 Constitutional Well-nourished and well-hydrated in no acute distress. Respiratory normal breathing without difficulty. Psychiatric this patient is able to make decisions and demonstrates  good insight into disease process. Alert and Oriented x 3. pleasant and cooperative. Notes Upon inspection patient's wound bed showed signs of complete epithelialization. There is no evidence of active infection at this time. Electronic Signature(s) Signed: 10/01/2019 4:45:51 PM By: Worthy Keeler PA-C Entered By: Worthy Keeler on 10/01/2019 16:45:51 Giammarco, [name] ([id number]) -------------------------------------------------------------------------------- Physician Orders Details Patient Name: [name]. Date of Service: 10/01/2019 1:45 PM Medical Record Number: [id number] Patient Account Number: [id number] Date of Birth/Sex: August 15, 1928 (86 y.o. F) Treating RN: [name] Primary Care Provider: Park Liter Other Clinician: Referring Provider: Park Liter Treating Provider/Extender: [name], Kevin Space Weeks in Treatment: 3 Verbal / Phone Orders: No Diagnosis Coding Discharge From [facility] o Discharge from [facility] - treatment complete Electronic Signature(s) Signed: 10/01/2019 3:29:42 PM By: [name] Signed: 10/01/2019 5:03:37 PM By: Worthy Keeler PA-C Entered By: [name] on 10/01/2019 14:10:58 Bealer, Augusta. ([id number]) -------------------------------------------------------------------------------- Problem List Details Patient Name: Gowan, Analiese Wise. Date of Service: 10/01/2019 1:45 PM Medical Record Number: [id number] Patient Account Number: [id number] Date of Birth/Sex: 1927-09-11 (86 y.o. F) Treating RN: [name] Primary Care Provider: Park Liter Other Clinician: Referring Provider:  Park Liter Treating Provider/Extender: [name], Miking Usrey Weeks in Treatment: 3 Active Problems ICD-10 Evaluated Encounter Code Description Active Date Today Diagnosis I87.2 Venous insufficiency (chronic) (peripheral) 09/10/2019 No Yes S81.801A Unspecified open wound, right lower leg, initial encounter 09/10/2019 No Yes Inactive Problems Resolved Problems Electronic Signature(s) Signed: 10/01/2019 2:13:20 PM By: Worthy Keeler PA-C Entered By: Worthy Keeler on 10/01/2019 14:13:20 Riccardi, Jazline Wise. ([id number]) -------------------------------------------------------------------------------- Progress Note Details Patient Name: Cozzolino, Praise Wise. Date of Service: 10/01/2019 1:45 PM Medical Record Number: [id number] Patient Account Number: [id number] Date of Birth/Sex: 08-Jun-1928 (86 y.o. F) Treating RN: [name] Primary Care Provider: Park Liter Other Clinician: Referring Provider: Park Liter Treating Provider/Extender: [name], [place] Bryngelson Weeks in Treatment: 3 Subjective Chief Complaint Information obtained from Patient Right LE skin tear History of Present Illness (HPI) 02/12/18 ADMISSION This is an 86 year old woman who is recently moved to [place] from [place]. Her story began in February where she fell on some ice suffering extensive lacerations to her bilateral lower extremities. She is able to show me extensive pictures of the right lower leg but with going to a wound care center and [place] 3 times a week these eventually closed over. She has been left with 1 area on the upper lateral left calf. She has been applying Neosporin to this and applying a bandage. Currently this measures 2 x 1.5 cm. The patient is not a diabetic. She is an ex-smoker quitting 40 years ago. She does have COPD by description. ABIs in our clinic were 1.32 on the right and 1.03 on the left 02/19/18; right lower leg wound which was initially trauma. The area that we look that last week is  smaller. Still covered in a nonviable surface however. We are using Iodoflex 02/26/18; right lower leg wound which was initially trauma in the setting of chronic venous insufficiency. Surface of the wound looks much better healthy granulation advancing epithelialization. We have good edema control 03/05/18; right lower leg wound which was initially trauma in the setting of chronic venous insufficiency. She continues to make nice progress here. We have good edema control He arrives today with a new traumatic laceration on her right dorsal forearm which she states happened while she was putting on a sweatshirt 03/12/18; right lower leg wound as closed however it still looks  vulnerable. She has chronic venous insufficiency. The new wound from last week a traumatic area on her right dorsal or arm unfortunately does not have a viable surface. I remove some nonviable skin and necrotic subcutaneous debris from the wound surface. Hemostasis with silver nitrate and direct pressure 03/19/18; right lower leg wound is closed. She has chronic venous insufficiency and will need ongoing compression The new wounds from 2 weeks ago on the right upper elbow is closed. The area on her right dorsal forearm continues to have a nonviable surface requiring debridement 03/26/18; we'll need to look into ongoing compression for her legs. 2 wounds on the right upper elbow is closed. The area on the right dorsal forearm looks better. Hydrofera Blue secondary to hypertrophic granulation 04/02/18; he has compression stockings although she did not wear them today. Severe chronic venous insufficiency. She has 2 wounds on the right upper elbow which I said were closed last week which actually are although they're very small and she has a smaller clean wound on the right dorsal forearm. We've been using Hydrofera Blue secondary to some upper granulation. All of this looks better 04/09/18-She is seen in follow up evaluation for a right  elbow and right forearm skin tear. The right elbow is healed. We will continue with same treatment plan and she'll follow next week 04/16/18-She is seen in follow-up evaluation for right forearm skin tear, this is essentially healed. We will cover with foam border and she will follow-up next week 04/23/18; the patient arrives with a new skin tear on her right dorsal arm just below the elbow. She got this while carrying a box. She has a linear skin tear. There is no depth I don't think this should've been sutured. There is a skin flap medially I'm not sure if this will be viable or not 04/30/18; the new skin tear from last week unfortunately has remained viable in terms of the skin flap. She has a small open area that looks healthy. Using moistened silver collagen 05/07/18; right dorsal arm skin tear. Nonviable tissue over the remnants of the wound. We've been using silver collagen MARTICIA, PUSATERI ([id number]) 05/21/18; right dorsal arm skin tear. Nonviable tissue over a small remnant of the wound was washed off. We've been using silver collagen which we will continue 06/04/18; the right dorsal arm skin tear has healed. She arrives today with a traumatic wound just above the olecranon of her left elbow. This is a skin tear with a nonviable nonadherent flap which was removed. We'll use silver collagen here. Also noteworthy she arrives without her compression stockings 06/18/18; the area just above the olecranon of her left elbow. It is smaller and generally has a healthy surface. I was surprised to learn that the patient is actually changing this herself although. Appears this week she was putting some topical lidocaine on this that she bought over-the-counter at the drugstore. She did get supplies at home she didn't know how to put it on. She drove herself here today i.e. not eligible for home health Readmission: 11/04/18 on evaluation today patient presents for evaluation our clinic news to include  the she has on her lower extremity which occurred as the result of having hit this on a piece of furniture. This was back in December. She states that her daughter has been trying to get her to come into have this evaluated here the wound care center but she was being somewhat stubborn. Nonetheless upon evaluation today the wound really appears to  be potentially healed she does have some swelling of lower extremity which I think will come into play. With that being said he tells me that even yesterday this was still making clear fluid and therefore there may be some issues with continued problems with the swelling and drainage secondary to her venous stasis. Nonetheless overall wound appears to be doing fairly well which is good news. 11/11/18 on evaluation today patient actually appears to be doing excellent in the area of question that was green just the day before he saw her last time actually appears to have not drain that always in past week. I think this is done very well and at this point though she's had some discomfort I do not see any evidence of open wound which he needs to continue see wound care. Readmission: 03/10/19 on evaluation today patient presents for reevaluation here in our clinic concerning issues that she actually is having with the new problem on her right lower leg due to trauma that occurred when a car door shut on her leg last Thursday. This is not been quite a week and she does have several skin tears unfortunately that resulted from this injury. Fortunately there's no signs of active infection at this time. No fevers, chills, nausea, or vomiting noted at this time. She does have some hot tissue noted at this point that has me concerned about the possibility of needing to move this in order to allow the areas to heal appropriately. Fortunately there's no signs of active infection at this time. 03/17/19-Patient returns to clinic at 1 week, we have been using Prisma on the  larger wounds with Kerlix and Xeroform. As many as 5 wounds were number last time 3 of which have closed, leaving the right proximal tibial and right distal medial leg wound both of which are looking good 03/24/19 on evaluation today patient appears to be doing better in regard to her wounds at this time. Fortunately there is no sign of active infection at this time. No fevers chills noted. She's been tolerating the dressing changes without complication which is good news she's been performing these herself it sounds like home health is not going to be coming out it sounds as if they feel like she may be able to take care of yourself and not be technically homebound. Nonetheless though that is unfortunate it does seem like she's done fairly well with taking care of yourself from the standpoint of the dressings. 03/31/19 on evaluation today patient actually appears to be doing well with regard to her lower extremity ulcers. She had one area to heal today and in regard to her right lower extremity were she continues to have an open wound I did have to perform some sharp debridement today. Subsequently the patient still has an area of what appears to be possibly a small hematoma on the anterior portion of her left lower extremity. This is not open enough at this time but nonetheless does seem to show some evidence of hardening which is not necessarily a bad thing. Eventually this may pop off but again I'm not interested in really removing this prematurely. 04/07/19 on evaluation today patient actually appears to be doing well with regard to her lower extremity ulcers. She is been tolerating the dressing changes without complication. She seems to be making excellent progress which is great news. She does have one small new skin tear area but the good news is she was able to pull the skin back over and  it really looks like it is healing quite nicely. Overall that is just a very small region and again seems  to be healing nicely. 04/14/19 upon evaluation today patient actually appears to be doing quite well with regard to the wounds on the right lower extremity. In fact everything appears to be healed as of today. This is excellent news. She does however have the area on the left lower extremity which is showing some signs of loosening up we have been watching this area it is no longer fluctuant and in fact with the Betadine on it it is been slowly dry now but is now lifting up on the edge. There may be a wound underneath [place] ([id number]) this region. 04/21/2019 on evaluation today patient appears to be doing excellent in regard to her bilateral lower extremities. In fact she has been tolerating the dressing changes without complication does not seem to show any signs of infection at this point. In fact based on what I am seeing currently I think she is completely healed. 05/04/2019 on evaluation today patient actually appears to be doing quite well with regard to her right elbow. She has been tolerating the dressing changes without complication. Fortunately there is no signs of active infection at this time. Overall I am very pleased with how things are progressing. The elbow almost appears to be completely healed though it is not 100% today. 05/14/2019 on evaluation today patient actually appears to be doing well with regard to her elbow skin tear that we have been treating in fact this appears to be completely healed. Unfortunately she has a new skin tear noted as of today which actually occurred in the last week since I previously saw her. This again is unfortunate that it has been so long because I am not going to be able to actually pull the skin over where you have to trim this away and let it heal by second intent. This new wound is in the right elbow region as well but again separate from the original ulcer. 05/19/2019 on evaluation today patient appears to be doing well with  regard to her skin tear on her elbow. She has been tolerating the dressing changes without complication. Fortunately there is no signs of active infection at this time. No fever chills noted. Overall I am very pleased with how things appear today. She is not having any significant pain which is also excellent news. 05/26/2019 on evaluation today patient actually appears to be doing well with regard to her right elbow ulcer. She has been tolerating the dressing changes without complication. Fortunately there is no signs of active infection at this time. No fevers, chills, nausea, vomiting, or diarrhea. 06/02/2019 on evaluation today patient appears to be doing very well with regard to her wound of the right elbow. In fact there is just a very small opening still remaining for the most part this is completely closed and appears to be doing excellent. There is no signs of active infection at this time which is also good news. No fevers, chills, nausea, vomiting, or diarrhea. 06/09/2019 on evaluation today patient appears to actually be doing excellent with regard to her original wound on her elbow which really appears to be completely healed at this point. With that being said she is having issues with an area generally of weeping in this region which I am unsure exactly what is causing this. Does not appear to be obviously bacterially infected although that could be part of  the issue. With that being said I do believe that there is also a chance there could be a fungal infection here. 06/18/2019 on evaluation today patient's elbow ulcer appears to be doing much better I do believe this was more likely a fungal infection that was going on at the last visit and fortunately she seems to be doing much better at this point. With that being said this is not completely healed but I am hopeful will be so by next week. She is very pleased that is really not having any pain in regard to the elbow. 06/25/2019  upon evaluation today patient appears to be doing well with regard to her elbow ulcer that we have been taking care of unfortunately she has a new skin tear on her forearm which has arisen since I last saw her. This she states resulted from a fall that she had tried to get out of her bathtub on Tuesday. Obviously this is not good news she continues to have significant falls which I am very concerned about to be perfectly honest. I explained to her that if she is falling she runs the risk of injuring her head or potentially breaking something that could be more significant which she states she understands but she just "tries not to think about it". I explained to her that I feel like this is something that she absolutely needs to think about in order to protect herself.-Suggested she may want to consider an assisted living facility she is in an apartment complex where she does have friends she really does not want to move out I therefore told her that at minimum I would recommend that she get in touch with her daughter and attempt if at all possible to have her contact her each day which she states they typically do anyway and if she is unable to get in touch with her that she subsequently call someone to check on her mother and again if they are not able to then have a way to be able to get into her apartment to check on her in case something untoward happens. 07/09/2019 on evaluation today patient actually appears to be doing excellent. She has been tolerating the dressing changes and her wound appears to be completely healed which is great news she has no new skin tears at this point. Readmission: 09/10/2019 patient presents today for readmission in our clinic due to a skin tear that she has on the right lower extremity which [place], [place]. ([id number]) actually occurred 2 days ago. She was actually seen last on July 09, 2019 when she had completely healed from her prior skin tear on her  arm. Fortunately there is no signs of active infection at this time. No fever chills noted. With that being said the patient tells me that she is not having any severe pain she did try to pull this back over as much as she could. This injury occurred as a result of hitting her leg on the car door. No fevers, chills, nausea, vomiting, or diarrhea. 09/17/2019 upon evaluation today patient appears to be doing well with regard to the overall dressing changes at this point. Fortunately there is no signs of active infection at this time. No fevers, chills, nausea, vomiting, or diarrhea. I am very pleased with how things seem to be progressing at this point. 09/24/2019 upon evaluation today patient appears to be doing well with regard to her skin tear. She has been tolerating the dressing changes without complication. Fortunately  there is no signs of infection and the patient seems to be doing quite well based on what I am seeing today. Overall I am very pleased with the progress at this point. The patient likewise is very happy. She does note that she did get her border foam dressings in the mail as well. 10/01/2019 on evaluation today patient actually appears to be completely healed upon inspection today. This is excellent news. Fortunately there is no evidence of active infection at this time. She has done excellent. Objective Constitutional Well-nourished and well-hydrated in no acute distress. Vitals Time Taken: 1:58 PM, Height: 60 in, Weight: 96 lbs, BMI: 18.7, Temperature: 97.9 F, Pulse: 67 bpm, Respiratory Rate: 16 breaths/min, Blood Pressure: 137/50 mmHg. Respiratory normal breathing without difficulty. Psychiatric this patient is able to make decisions and demonstrates good insight into disease process. Alert and Oriented x 3. pleasant and cooperative. General Notes: Upon inspection patient's wound bed showed signs of complete epithelialization. There is no evidence of active infection at  this time. Integumentary (Hair, Skin) Wound #18 status is Healed - Epithelialized. Original cause of wound was Trauma. The wound is located on the Right,Anterior Lower Leg. The wound measures 0cm length x 0cm width x 0cm depth; 0cm^2 area and 0cm^3 volume. There is no tunneling or undermining noted. There is a none present amount of drainage noted. The wound margin is flat and intact. There is no granulation within the wound bed. There is no necrotic tissue within the wound bed. Assessment Active Problems SHONTRELL, FEDOROV. ([id number]) ICD-10 Venous insufficiency (chronic) (peripheral) Unspecified open wound, right lower leg, initial encounter Plan Discharge From [facility] Services: [facility] - treatment complete 1. At this point the patient is completely healed and we are going to discontinue wound care services 2. I did recommend the patient needs to be careful with not bumping anything in order to injure herself. Obviously I think that this is something she definitely needs to focus on going forward as she has had several recent injuries for which I have seen her. We will see her back for follow-up visit as needed. Electronic Signature(s) Signed: 10/01/2019 4:46:37 PM By: Worthy Keeler PA-C Entered By: Worthy Keeler on 10/01/2019 16:46:37 [place], [name] -------------------------------------------------------------------------------- SuperBill Details Patient Name: [name]. Date of Service: 10/01/2019 Medical Record Number: [id number] Patient Account Number: [id number] Date of Birth/Sex: [birth year] (86 y.o. F) Treating RN: [name] Primary Care Provider: Park Liter Other Clinician: Referring Provider: Park Liter Treating Provider/Extender: [name], Farhaan [place] Weeks in Treatment: 3 Diagnosis Coding ICD-10 Codes Code Description I87.2 Venous insufficiency (chronic) (peripheral) S81.801A Unspecified open wound, right  lower leg, initial encounter Facility Procedures CPT4 Code: [id number] Description: 99213 - WOUND CARE VISIT-LEV 3 EST PT Modifier: Quantity: 1 Physician Procedures CPT4 Code: [id number] Description: [id number] - WC PHYS LEVEL 2 - EST PT ICD-10 Diagnosis Description I87.2 Venous insufficiency (chronic) (peripheral) S81.801A Unspecified open wound, right lower leg, initial encounte Modifier: r Quantity: 1 Electronic Signature(s) Signed: 10/01/2019 4:46:56 PM By: Worthy Keeler PA-C Entered By: Worthy Keeler on 10/01/2019 16:46:56

## 2019-10-01 NOTE — Progress Notes (Signed)
CLAREEN, OBOYLE ([id number]) Visit Report for 10/01/2019 Arrival Information Details Patient Name: [name], [name]. Date of Service: 10/01/2019 1:45 PM Medical Record Number: [id number] Patient Account Number: [id number] Date of Birth/Sex: 1927-09-23 (86 y.o. F) Treating RN: [name] Primary Care Ednah Hammock: Park Liter Other Clinician: Referring Chung Chagoya: Park Liter Treating Prateek Knipple/Extender: [name], [name] Weeks in Treatment: 3 Visit Information History Since Last Visit Added or deleted any medications: No Patient Arrived: Ambulatory Any new allergies or adverse reactions: No Arrival Time: 13:55 Had a fall or experienced change in No Accompanied By: self activities of daily living that may affect Transfer Assistance: None risk of falls: Patient Identification Verified: Yes Signs or symptoms of abuse/neglect since last visito No Hospitalized since last visit: No Has Dressing in Place as Prescribed: Yes Pain Present Now: No Electronic Signature(s) Signed: 10/01/2019 3:29:42 PM By: [name] Entered By: [name] on 10/01/2019 13:58:04 Zingg, Lailah Lemmie Evens ([id number]) -------------------------------------------------------------------------------- Clinic Level of Care Assessment Details Patient Name: [name], [name] Date of Service: 10/01/2019 1:45 PM Medical Record Number: [id number] Patient Account Number: [id number] Date of Birth/Sex: July 23, 1928 (86 y.o. F) Treating RN: [name] Primary Care Toini Failla: Park Liter Other Clinician: Referring Jerol Rufener: Park Liter Treating Jeaneane Adamec/Extender: [name], [name] Weeks in Treatment: 3 Clinic Level of Care Assessment Items TOOL 4 Quantity Score []  - Use when only an EandM is performed on FOLLOW-UP visit 0 ASSESSMENTS - Nursing Assessment / Reassessment X - Reassessment of Co-morbidities (includes updates in patient status) 1 10 X- 1 5 Reassessment of Adherence to Treatment Plan ASSESSMENTS -  Wound and Skin Assessment / Reassessment X - Simple Wound Assessment / Reassessment - one wound 1 5 []  - 0 Complex Wound Assessment / Reassessment - multiple wounds []  - 0 Dermatologic / Skin Assessment (not related to wound area) ASSESSMENTS - Focused Assessment []  - Circumferential Edema Measurements - multi extremities 0 []  - 0 Nutritional Assessment / Counseling / Intervention X- 1 5 Lower Extremity Assessment (monofilament, tuning fork, pulses) []  - 0 Peripheral Arterial Disease Assessment (using hand held doppler) ASSESSMENTS - Ostomy and/or Continence Assessment and Care []  - Incontinence Assessment and Management 0 []  - 0 Ostomy Care Assessment and Management (repouching, etc.) PROCESS - Coordination of Care X - Simple Patient / Family Education for ongoing care 1 15 []  - 0 Complex (extensive) Patient / Family Education for ongoing care X- 1 10 Staff obtains Programmer, systems, Records, Test Results / Process Orders []  - 0 Staff telephones HHA, Nursing Homes / Clarify orders / etc []  - 0 Routine Transfer to another Facility (non-emergent condition) []  - 0 Routine Hospital Admission (non-emergent condition) []  - 0 New Admissions / Biomedical engineer / Ordering NPWT, Apligraf, etc. []  - 0 Emergency Hospital Admission (emergent condition) X- 1 10 Simple Discharge Coordination INICE, MARTINOVIC. ([id number]) []  - 0 Complex (extensive) Discharge Coordination PROCESS - Special Needs []  - Pediatric / Minor Patient Management 0 []  - 0 Isolation Patient Management []  - 0 Hearing / Language / Visual special needs []  - 0 Assessment of Community assistance (transportation, D/C planning, etc.) []  - 0 Additional assistance / Altered mentation []  - 0 Support Surface(s) Assessment (bed, cushion, seat, etc.) INTERVENTIONS - Wound Cleansing / Measurement X - Simple Wound Cleansing - one wound 1 5 []  - 0 Complex Wound Cleansing - multiple wounds X- 1 5 Wound Imaging  (photographs - any number of wounds) []  - 0 Wound Tracing (instead of photographs) X- 1 5 Simple Wound Measurement - one wound []  -  0 Complex Wound Measurement - multiple wounds INTERVENTIONS - Wound Dressings []  - Small Wound Dressing one or multiple wounds 0 []  - 0 Medium Wound Dressing one or multiple wounds []  - 0 Large Wound Dressing one or multiple wounds []  - 0 Application of Medications - topical []  - 0 Application of Medications - injection INTERVENTIONS - Miscellaneous []  - External ear exam 0 []  - 0 Specimen Collection (cultures, biopsies, blood, body fluids, etc.) []  - 0 Specimen(s) / Culture(s) sent or taken to Lab for analysis []  - 0 Patient Transfer (multiple staff / Civil Service fast streamer / Similar devices) []  - 0 Simple Staple / Suture removal (25 or less) []  - 0 Complex Staple / Suture removal (26 or more) []  - 0 Hypo / Hyperglycemic Management (close monitor of Blood Glucose) []  - 0 Ankle / Brachial Index (ABI) - do not check if billed separately X- 1 5 Vital Signs Rathbone, Audriana H. ([id number]) Has the patient been seen at the hospital within the last three years: Yes Total Score: 80 Level Of Care: New/Established - Level 3 Electronic Signature(s) Signed: 10/01/2019 3:29:42 PM By: [name] Entered By: [name] on 10/01/2019 14:11:24 Guido, [name] ([id number]) -------------------------------------------------------------------------------- Encounter Discharge Information Details Patient Name: Carol Berger H. Date of Service: 10/01/2019 1:45 PM Medical Record Number: [id number] Patient Account Number: [id number] Date of Birth/Sex: 05/21/1928 (86 y.o. F) Treating RN: [name] Primary Care Teruo Stilley: Park Liter Other Clinician: Referring Reymundo Winship: Park Liter Treating Dazaria Macneill/Extender: [name], [name] Weeks in Treatment: 3 Encounter Discharge Information Items Discharge Condition: Stable Ambulatory Status: Ambulatory Discharge  Destination: Home Transportation: Private Auto Accompanied By: self Schedule Follow-up Appointment: Yes Clinical Summary of Care: Electronic Signature(s) Signed: 10/01/2019 3:29:42 PM By: [name] Entered By: [name] on 10/01/2019 14:11:56 Tata, Kamile H. ([id number]) -------------------------------------------------------------------------------- Lower Extremity Assessment Details Patient Name: Caseres, Chandi H. Date of Service: 10/01/2019 1:45 PM Medical Record Number: [id number] Patient Account Number: [id number] Date of Birth/Sex: 1927-12-08 (86 y.o. F) Treating RN: [name] Primary Care Makenzey Nanni: Park Liter Other Clinician: Referring Sharlyn Odonnel: Park Liter Treating Drayson Dorko/Extender: [name], [name] Weeks in Treatment: 3 Edema Assessment Assessed: [Left: No] [Right: No] Edema: [Left: N] [Right: o] Vascular Assessment Pulses: Dorsalis Pedis Palpable: [Right:Yes] Electronic Signature(s) Signed: 10/01/2019 3:29:42 PM By: [name] Entered By: [name] on 10/01/2019 14:00:45 Tello, Deberah H. ([id number]) -------------------------------------------------------------------------------- Multi Wound Chart Details Patient Name: Lantz, Sanii H. Date of Service: 10/01/2019 1:45 PM Medical Record Number: [id number] Patient Account Number: [id number] Date of Birth/Sex: May 01, 1928 (86 y.o. F) Treating RN: [name] Primary Care Quamere Mussell: Park Liter Other Clinician: Referring Gavrielle Streck: Park Liter Treating Eldine Rencher/Extender: [name], [name] Weeks in Treatment: 3 Vital Signs Height(in): 60 Pulse(bpm): 105 Weight(lbs): 96 Blood Pressure(mmHg): 137/50 Body Mass Index(BMI): 19 Temperature(F): 97.9 Respiratory Rate 16 (breaths/min): Photos: [N/A:N/A] Wound Location: Right, Anterior Lower Leg N/A N/A Wounding Event: Trauma N/A N/A Primary Etiology: Trauma, Other N/A N/A Comorbid History: Cataracts, Glaucoma, Asthma, N/A  N/A Chronic Obstructive Pulmonary Disease (COPD), Arrhythmia, Hypertension Date Acquired: 09/08/2019 N/A N/A Weeks of Treatment: 3 N/A N/A Wound Status: Healed - Epithelialized N/A N/A Measurements L x W x D 0x0x0 N/A N/A (cm) Area (cm) : 0 N/A N/A Volume (cm) : 0 N/A N/A % Reduction in Area: 100.00% N/A N/A % Reduction in Volume: 100.00% N/A N/A Classification: Full Thickness Without N/A N/A Exposed Support Structures Exudate Amount: None Present N/A N/A Wound Margin: Flat and Intact N/A N/A Granulation Amount: None Present (0%) N/A N/A Necrotic Amount: None  Present (0%) N/A N/A Exposed Structures: Fascia: No N/A N/A Fat Layer (Subcutaneous Tissue) Exposed: No Tendon: No Muscle: No Joint: No Bone: No Hooser, Daielle H. ([id number]) Epithelialization: Large (67-100%) N/A N/A Treatment Notes Electronic Signature(s) Signed: 10/01/2019 3:29:42 PM By: [name] Entered By: [name] on 10/01/2019 14:10:37 Tallo, [name] ([id number]) -------------------------------------------------------------------------------- Toyah Details Patient Name: [name] Date of Service: 10/01/2019 1:45 PM Medical Record Number: [id number] Patient Account Number: [id number] Date of Birth/Sex: 11-Nov-1927 (86 y.o. F) Treating RN: [name] Primary Care Brelyn Woehl: Park Liter Other Clinician: Referring Oneisha Ammons: Park Liter Treating Benedicta Sultan/Extender: [name], [name] Weeks in Treatment: 3 Active Inactive Electronic Signature(s) Signed: 10/01/2019 3:29:42 PM By: [name] Entered By: [name] on 10/01/2019 14:10:02 Dohrmann, Leiah H. ([id number]) -------------------------------------------------------------------------------- Pain Assessment Details Patient Name: Inga, Orlandria H. Date of Service: 10/01/2019 1:45 PM Medical Record Number: [id number] Patient Account Number: [id number] Date of Birth/Sex: Jan 11, 1928 (86 y.o.  F) Treating RN: [name] Primary Care Bernhard Koskinen: Park Liter Other Clinician: Referring Sherl Yzaguirre: Park Liter Treating Merrit Friesen/Extender: [name], [name] Weeks in Treatment: 3 Active Problems Location of Pain Severity and Description of Pain Patient Has Paino No Site Locations Pain Management and Medication Current Pain Management: Electronic Signature(s) Signed: 10/01/2019 3:29:42 PM By: [name] Entered By: [name] on 10/01/2019 13:58:11 [place], [place]. ([id number]) -------------------------------------------------------------------------------- Patient/Caregiver Education Details Patient Name: [name] Date of Service: 10/01/2019 1:45 PM Medical Record Number: [id number] Patient Account Number: [id number] Date of Birth/Gender: Jun 17, 1928 (86 y.o. F) Treating RN: [name] Primary Care Physician: Park Liter Other Clinician: Referring Physician: Park Liter Treating Physician/Extender: Sharalyn Ink in Treatment: 3 Education Assessment Education Provided To: Patient Education Topics Provided Wound/Skin Impairment: Handouts: Caring for Your Ulcer Methods: Demonstration, Explain/Verbal Responses: State content correctly Electronic Signature(s) Signed: 10/01/2019 3:29:42 PM By: [name] Entered By: [name] on 10/01/2019 14:11:37 Lormand, Kristan H. ([id number]) -------------------------------------------------------------------------------- Wound Assessment Details Patient Name: Mcminn, Nasim H. Date of Service: 10/01/2019 1:45 PM Medical Record Number: [id number] Patient Account Number: [id number] Date of Birth/Sex: [birth year] (86 y.o. F) Treating RN: [name] Primary Care Maggie Senseney: Park Liter Other Clinician: Referring Corleone Biegler: Park Liter Treating Karole Oo/Extender: [name], [name] Weeks in Treatment: 3 Wound Status Wound Number: [id number] Primary Trauma, Other Etiology: Wound Location: Right,  Anterior Lower Leg Wound Healed - Epithelialized Wounding Event: Trauma Status: Date Acquired: 09/08/2019 Comorbid Cataracts, Glaucoma, Asthma, Chronic Weeks Of Treatment: 3 History: Obstructive Pulmonary Disease (COPD), Clustered Wound: No Arrhythmia, Hypertension Photos Wound Measurements Length: (cm) 0 % Reducti Width: (cm) 0 % Reducti Depth: (cm) 0 Epithelia Area: (cm) 0 Tunnelin Volume: (cm) 0 Undermin on in Area: 100% on in Volume: 100% lization: Large (67-100%) g: No ing: No Wound Description Full Thickness Without Exposed Support Foul Odor Classification: Structures Slough/Fi Wound Margin: Flat and Intact Exudate None Present Amount: After Cleansing: No brino Yes Wound Bed Granulation Amount: None Present (0%) Exposed Structure Necrotic Amount: None Present (0%) Fascia Exposed: No Fat Layer (Subcutaneous Tissue) Exposed: No Tendon Exposed: No Muscle Exposed: No Joint Exposed: No Bone Exposed: No Electronic Signature(s) JEANITA, BRIGNOLA ([id number]) Signed: 10/01/2019 3:29:42 PM By: [name] Entered By: [name] on 10/01/2019 14:09:50 Fiorini, Mariluz H. ([id number]) -------------------------------------------------------------------------------- Vitals Details Patient Name: Carol Berger H. Date of Service: 10/01/2019 1:45 PM Medical Record Number: [id number] Patient Account Number: [id number] Date of Birth/Sex: March 31, 1928 (86 y.o. F) Treating RN: [name] Primary Care Kaleyah Labreck: Park Liter Other Clinician: Referring Igor Bishop: Park Liter Treating Alexy Heldt/Extender: [name],  [name] Weeks in Treatment: 3 Vital Signs Time Taken: 13:58 Temperature (F): 97.9 Height (in): 60 Pulse (bpm): 67 Weight (lbs): 96 Respiratory Rate (breaths/min): 16 Body Mass Index (BMI): 18.7 Blood Pressure (mmHg): 137/50 Reference Range: 80 - 120 mg / dl Electronic Signature(s) Signed: 10/01/2019 3:29:42 PM By: [name] Entered By: [name] on 10/01/2019 13:58:35

## 2019-10-10 DIAGNOSIS — J432 Centrilobular emphysema: Secondary | ICD-10-CM | POA: Diagnosis not present

## 2019-10-10 DIAGNOSIS — J449 Chronic obstructive pulmonary disease, unspecified: Secondary | ICD-10-CM | POA: Diagnosis not present

## 2019-10-22 ENCOUNTER — Encounter: Payer: Medicare Other | Admitting: Family Medicine

## 2019-10-28 ENCOUNTER — Ambulatory Visit (INDEPENDENT_AMBULATORY_CARE_PROVIDER_SITE_OTHER): Payer: Medicare Other | Admitting: Licensed Clinical Social Worker

## 2019-10-28 DIAGNOSIS — E039 Hypothyroidism, unspecified: Secondary | ICD-10-CM

## 2019-10-28 DIAGNOSIS — I10 Essential (primary) hypertension: Secondary | ICD-10-CM | POA: Diagnosis not present

## 2019-10-28 DIAGNOSIS — F339 Major depressive disorder, recurrent, unspecified: Secondary | ICD-10-CM | POA: Diagnosis not present

## 2019-10-28 DIAGNOSIS — I480 Paroxysmal atrial fibrillation: Secondary | ICD-10-CM

## 2019-10-28 DIAGNOSIS — F419 Anxiety disorder, unspecified: Secondary | ICD-10-CM

## 2019-10-28 NOTE — Chronic Care Management (AMB) (Signed)
Chronic Care Management    Clinical Social Work General Note  10/28/2019 Name: [name] MRN: [id number] DOB: 1927-10-22  [name] is a 86 y.o. year old female who is a primary care patient of Valerie Roys, DO. The CCM was consulted to assist the patient with Level of Care Concerns and Caregiver Stress.   Ms. Licciardi was given information about Chronic Care Management services today including:  1. CCM service includes personalized support from designated clinical staff supervised by her physician, including individualized plan of care and coordination with other care providers 2. 24/7 contact phone numbers for assistance for urgent and routine care needs. 3. Service will only be billed when office clinical staff spend 20 minutes or more in a month to coordinate care. 4. Only one practitioner may furnish and bill the service in a calendar month. 5. The patient may stop CCM services at any time (effective at the end of the month) by phone call to the office staff. 6. The patient will be responsible for cost sharing (co-pay) of up to 20% of the service fee (after annual deductible is met).  Patient agreed to services and verbal consent obtained.   Review of patient status, including review of consultants reports, relevant laboratory and other test results, and collaboration with appropriate care team members and the patient's provider was performed as part of comprehensive patient evaluation and provision of chronic care management services.    SDOH (Social Determinants of Health) assessments and interventions performed:  Yes    Outpatient Encounter Medications as of 10/28/2019  Medication Sig  . acetaminophen (TYLENOL) 325 MG tablet Take 2 tablets (650 mg total) by mouth every 6 (six) hours as needed for mild pain or fever.  Marland Kitchen amLODipine (NORVASC) 2.5 MG tablet Take 1 tablet (2.5 mg total) by mouth daily.  Marland Kitchen aspirin EC 81 MG tablet Take 1 tablet (81 mg total) by mouth  daily.  . feeding supplement, ENSURE ENLIVE, (ENSURE ENLIVE) LIQD Take 237 mLs by mouth 2 (two) times daily between meals.  . ferrous sulfate 325 (65 FE) MG tablet Take 1 tablet (325 mg total) by mouth 3 (three) times daily with meals.  . fluticasone (FLONASE) 50 MCG/ACT nasal spray Place 2 sprays into both nostrils daily.  . Fluticasone-Salmeterol (ADVAIR DISKUS) 250-50 MCG/DOSE AEPB Inhale 1 puff into the lungs daily. Rinse mouth after use.  . latanoprost (XALATAN) 0.005 % ophthalmic solution INSTILL 1 DROP INTO EACH EYE AT BEDTIME  . levothyroxine (SYNTHROID) 150 MCG tablet Take 1 tablet (150 mcg total) by mouth daily.  . metoprolol tartrate (LOPRESSOR) 50 MG tablet Take 1 tablet (50 mg total) by mouth 2 (two) times daily.  . multivitamin-lutein (OCUVITE-LUTEIN) CAPS capsule Take 1 capsule by mouth daily.  . OXYGEN Inhale 2 L into the lungs. As needed during the day, and full use at bedtime  . solifenacin (VESICARE) 10 MG tablet Take 1 tablet (10 mg total) by mouth daily.   No facility-administered encounter medications on file as of 10/28/2019.    LCSW spoke with daughter who reports patient is doing well and continues to drive (which is starting to becoming concerning to them.) Patient also had a recent fall. Patient is having recent memory loss as well and lost her purse last week at the grocery store.  Follow Up Plan: SW will follow up with patient by phone over the next [place], BSW, MSW, [place]  [email or web address] Phone: [phone]

## 2019-10-30 ENCOUNTER — Ambulatory Visit: Payer: Medicare Other | Admitting: Family Medicine

## 2019-10-30 ENCOUNTER — Other Ambulatory Visit: Payer: Self-pay

## 2019-10-30 ENCOUNTER — Other Ambulatory Visit: Payer: Medicare Other

## 2019-10-30 DIAGNOSIS — E039 Hypothyroidism, unspecified: Secondary | ICD-10-CM

## 2019-10-31 DIAGNOSIS — S51011A Laceration without foreign body of right elbow, initial encounter: Secondary | ICD-10-CM | POA: Diagnosis not present

## 2019-10-31 DIAGNOSIS — L03113 Cellulitis of right upper limb: Secondary | ICD-10-CM | POA: Diagnosis not present

## 2019-10-31 LAB — TSH: TSH: 0.043 u[IU]/mL — ABNORMAL LOW (ref 0.450–4.500)

## 2019-11-02 ENCOUNTER — Other Ambulatory Visit: Payer: Self-pay | Admitting: Family Medicine

## 2019-11-02 MED ORDER — LEVOTHYROXINE SODIUM 137 MCG PO TABS: 137.0000 ug | ORAL_TABLET | Freq: Every day | ORAL | 1 refills | Status: DC

## 2019-11-05 ENCOUNTER — Ambulatory Visit: Payer: Medicare Other | Admitting: Physician Assistant

## 2019-11-07 DIAGNOSIS — J432 Centrilobular emphysema: Secondary | ICD-10-CM | POA: Diagnosis not present

## 2019-11-07 DIAGNOSIS — J449 Chronic obstructive pulmonary disease, unspecified: Secondary | ICD-10-CM | POA: Diagnosis not present

## 2019-11-24 ENCOUNTER — Other Ambulatory Visit: Payer: Self-pay

## 2019-11-24 ENCOUNTER — Encounter: Payer: Self-pay | Admitting: Nurse Practitioner

## 2019-11-24 ENCOUNTER — Ambulatory Visit (INDEPENDENT_AMBULATORY_CARE_PROVIDER_SITE_OTHER): Payer: Medicare Other | Admitting: Nurse Practitioner

## 2019-11-24 DIAGNOSIS — M25551 Pain in right hip: Secondary | ICD-10-CM | POA: Diagnosis not present

## 2019-11-24 NOTE — Assessment & Plan Note (Addendum)
Acute x 5 days.  Is improving with use of Lidocaine cream at home.  Suspect some OA with advanced age.  At this time declines imaging.  Recommend use of Tylenol on schedule at home, may use max 3000 MG daily (recommend 1000 MG in morning and 1000 MG in evening).  Continue Lidocaine cream as needed. Recommend gentle stretches at home and may alternate heat and ice as needed.  If worsening or ongoing symptoms return to office.  If any rashes present notify provider immediately.

## 2019-11-24 NOTE — Patient Instructions (Signed)

## 2019-11-24 NOTE — Progress Notes (Signed)
BP (!) 156/65 (BP Location: Left Arm, Patient Position: Sitting, Cuff Size: Small)   Pulse 68   Temp 98 F (36.7 C) (Oral)   SpO2 97%    Subjective:    Patient ID: [name], female    DOB: 09/01/1928, 86 y.o.   MRN: [id number]  HPI: [name] is a 86 y.o. female  Chief Complaint  Patient presents with  . Hip Pain    right    HIP PAIN Has been present since Thursday or Friday.  No recent falls.  No history of falls or fractures.  Not as bad as was first couple of days.  Has history of shingles several years ago, denies rashes today. Duration: days Involved hip: right  Mechanism of injury: unknown Location: lateral Onset: gradual  Severity: 10/10 at worst, 5-6 today Quality: sharp Frequency: intermittent -- as long as she does not move it does not hurt Radiation: no Aggravating factors: walking, bending and movement   Alleviating factors: Lidocaine cream and APAP  Status: fluctuating Treatments attempted: Lidocaine cream and APAP   Relief with NSAIDs?: No NSAIDs Taken Weakness with weight bearing: yes Weakness with walking: yes Paresthesias / decreased sensation: no Swelling: no Redness:no Fevers: no  Relevant past medical, surgical, family and social history reviewed and updated as indicated. Interim medical history since our last visit reviewed. Allergies and medications reviewed and updated.  Review of Systems  Constitutional: Negative for activity change, appetite change, diaphoresis, fatigue and fever.  Respiratory: Negative for cough, chest tightness and shortness of breath.   Cardiovascular: Negative for chest pain, palpitations and leg swelling.  Gastrointestinal: Negative.   Musculoskeletal: Positive for arthralgias.  Neurological: Negative.   Psychiatric/Behavioral: Negative.     Per HPI unless specifically indicated above     Objective:    BP (!) 156/65 (BP Location: Left Arm, Patient Position: Sitting, Cuff Size: Small)    Pulse 68   Temp 98 F (36.7 C) (Oral)   SpO2 97%   Wt Readings from Last 3 Encounters:  09/09/19 98 lb 3.2 oz (44.5 kg)  07/21/19 92 lb (41.7 kg)  06/24/19 92 lb (41.7 kg)    Physical Exam Vitals and nursing note reviewed.  Constitutional:      General: She is awake. She is not in acute distress.    Appearance: She is well-developed and well-groomed. She is not ill-appearing.  HENT:     Head: Normocephalic.     Right Ear: Hearing normal.     Left Ear: Hearing normal.  Eyes:     General: Lids are normal.        Right eye: No discharge.        Left eye: No discharge.     Conjunctiva/sclera: Conjunctivae normal.     Pupils: Pupils are equal, round, and reactive to light.  Neck:     Vascular: No carotid bruit.  Cardiovascular:     Rate and Rhythm: Normal rate and regular rhythm.     Heart sounds: Normal heart sounds. No murmur. No gallop.   Pulmonary:     Effort: Pulmonary effort is normal. No accessory muscle usage or respiratory distress.     Breath sounds: Normal breath sounds.  Abdominal:     General: Bowel sounds are normal.     Palpations: Abdomen is soft.  Musculoskeletal:     Cervical back: Normal range of motion and neck supple.     Right hip: Crepitus present. No deformity, lacerations or tenderness. Normal range  of motion. Normal strength.     Left hip: Normal.     Right lower leg: No edema.     Left lower leg: No edema.     Comments: No rashes noted.  Skin:    General: Skin is warm and dry.  Neurological:     Mental Status: She is alert and oriented to person, place, and time.  Psychiatric:        Attention and Perception: Attention normal.        Mood and Affect: Mood normal.        Speech: Speech normal.        Behavior: Behavior normal. Behavior is cooperative.    Hip Exam: bilateral On initially getting out of chair some discomfort, but able to get up to exam table wihtout assist and no pain reported.  Normal gait.    Tenderness to palpation: none      Greater trochanter: no      Anterior superior iliac spine: no     Anterior hip: no     Iliac crest: no     Iliac tubercle: no     Pubic tubercle: no     SI joint: no      Range of Motion: Full ROM    Flexion: Normal    Extension: Normal    Abduction: Normal    Adduction: Normal    Internal rotation: Normal    External rotation: Normal     Muscle Strength:  5/5 bilaterally    Flexion:  5/5    Extension:  5/5    Abductors:  5/5    Adductors:  5/5     Special Tests:    Ober test: negative    FABER test:negative   Results for orders placed or performed in visit on 10/30/19  TSH  Result Value Ref Range   TSH 0.043 (L) 0.450 - 4.500 uIU/mL      Assessment & Plan:   Problem List Items Addressed This Visit      Other   Right hip pain    Acute x 5 days.  Is improving with use of Lidocaine cream at home.  Suspect some OA with advanced age.  At this time declines imaging.  Recommend use of Tylenol on schedule at home, may use max 3000 MG daily (recommend 1000 MG in morning and 1000 MG in evening).  Continue Lidocaine cream as needed. Recommend gentle stretches at home and may alternate heat and ice as needed.  If worsening or ongoing symptoms return to office.  If any rashes present notify provider immediately.          Follow up plan: Return if symptoms worsen or fail to improve.

## 2019-12-01 DIAGNOSIS — H401131 Primary open-angle glaucoma, bilateral, mild stage: Secondary | ICD-10-CM | POA: Diagnosis not present

## 2019-12-08 DIAGNOSIS — J432 Centrilobular emphysema: Secondary | ICD-10-CM | POA: Diagnosis not present

## 2019-12-08 DIAGNOSIS — J449 Chronic obstructive pulmonary disease, unspecified: Secondary | ICD-10-CM | POA: Diagnosis not present

## 2019-12-15 ENCOUNTER — Other Ambulatory Visit: Payer: Self-pay

## 2019-12-15 ENCOUNTER — Other Ambulatory Visit: Payer: Medicare Other

## 2019-12-15 DIAGNOSIS — E039 Hypothyroidism, unspecified: Secondary | ICD-10-CM

## 2019-12-16 ENCOUNTER — Other Ambulatory Visit: Payer: Self-pay | Admitting: Family Medicine

## 2019-12-16 DIAGNOSIS — E039 Hypothyroidism, unspecified: Secondary | ICD-10-CM

## 2019-12-16 LAB — TSH: TSH: 0.006 u[IU]/mL — ABNORMAL LOW (ref 0.450–4.500)

## 2019-12-16 MED ORDER — LEVOTHYROXINE SODIUM 100 MCG PO TABS: 100.0000 ug | ORAL_TABLET | Freq: Every day | ORAL | 1 refills | Status: DC

## 2019-12-24 IMAGING — CT CT HEAD WITHOUT CONTRAST
3 series · 16 of 46 positions shown, 19 images · non-contrast
Comparison: None.

CLINICAL DATA: Patient with dizziness.

EXAM:
CT HEAD WITHOUT CONTRAST
TECHNIQUE: Contiguous axial images were obtained from the base of the skull
through the vertex without intravenous contrast.

[Series 2: head wo · axial · 0.47mm/px · z∈[-105,+15]mm · 10 of 29 slices shown, 13 images]
[im 3/29  brain]
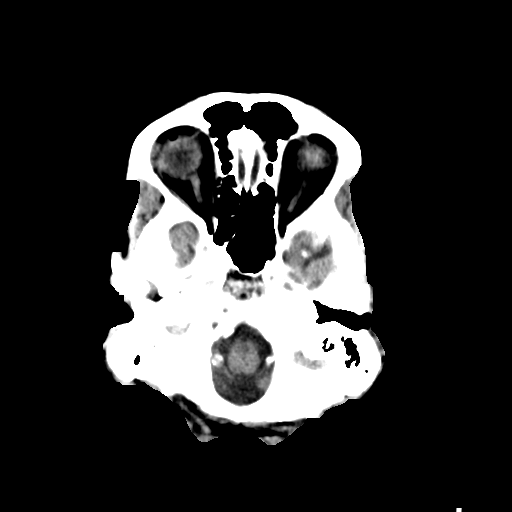
[im 3/29  bone]
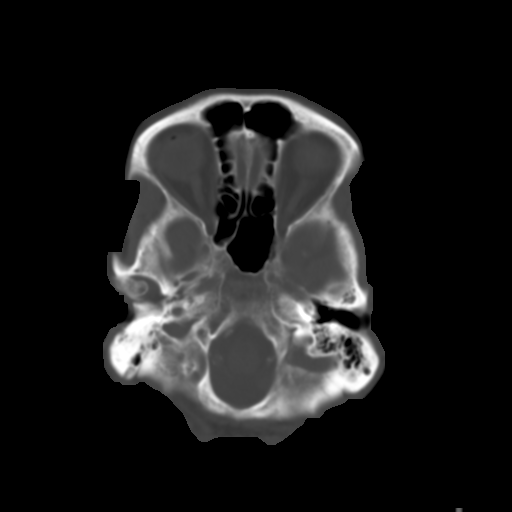
[im 6/29  brain]
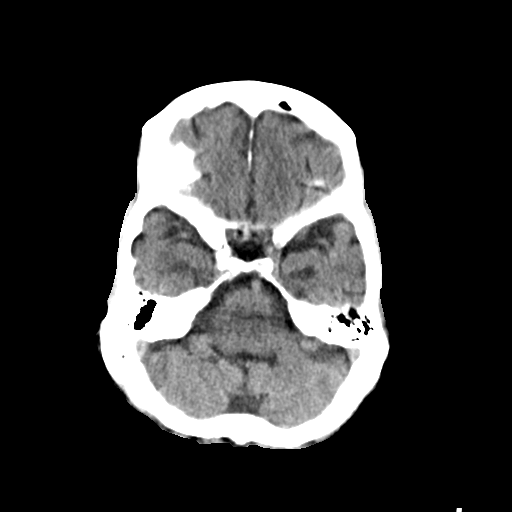
[im 8/29  brain]
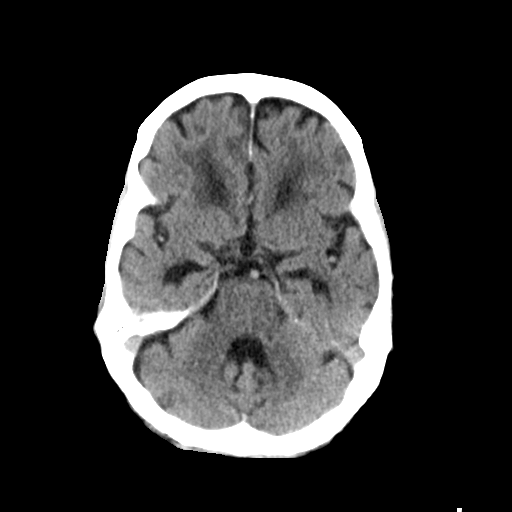
[im 11/29  brain]
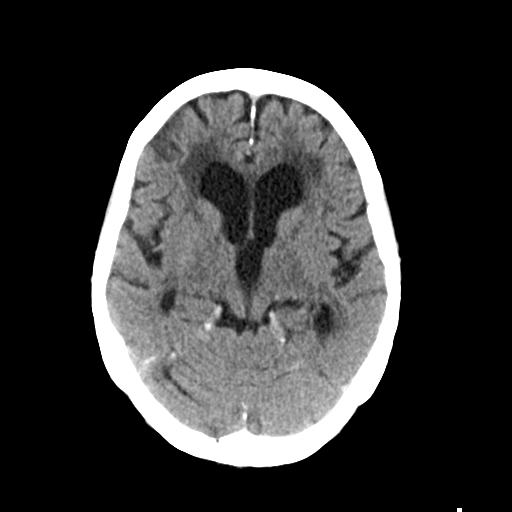
[im 14/29  brain]
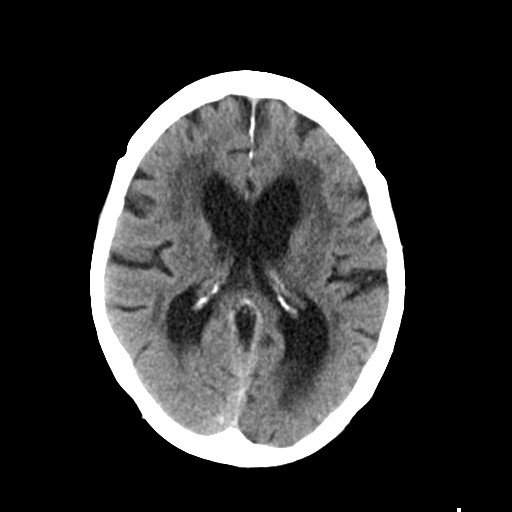
[im 14/29  bone]
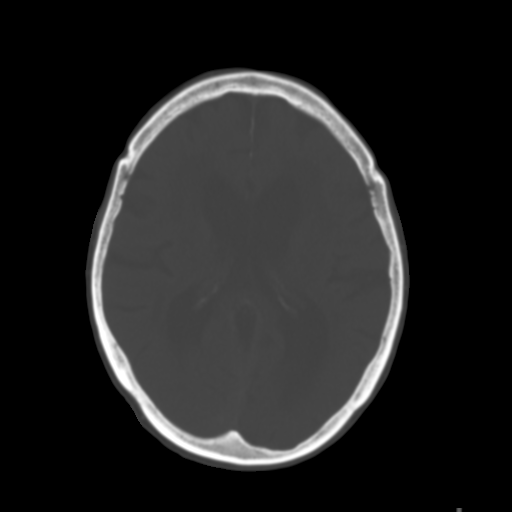
[im 16/29  brain]
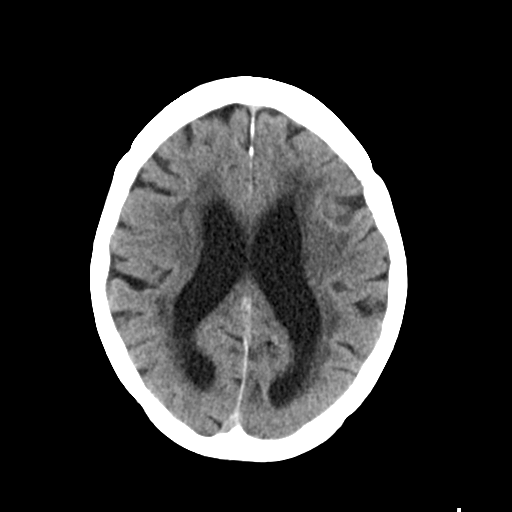
[im 19/29  brain]
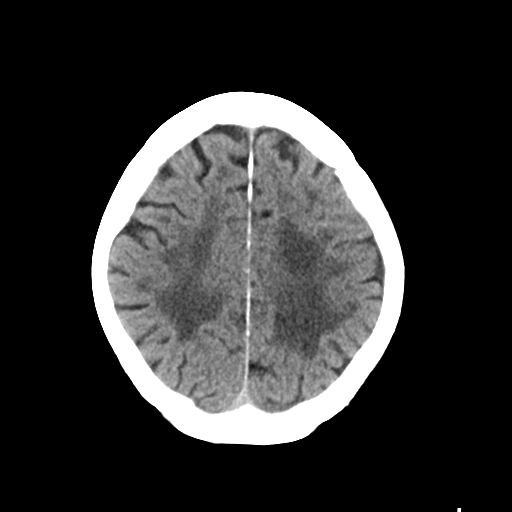
[im 22/29  brain]
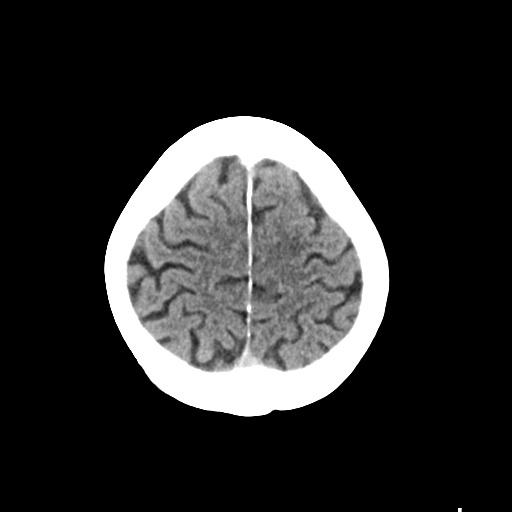
[im 24/29  brain]
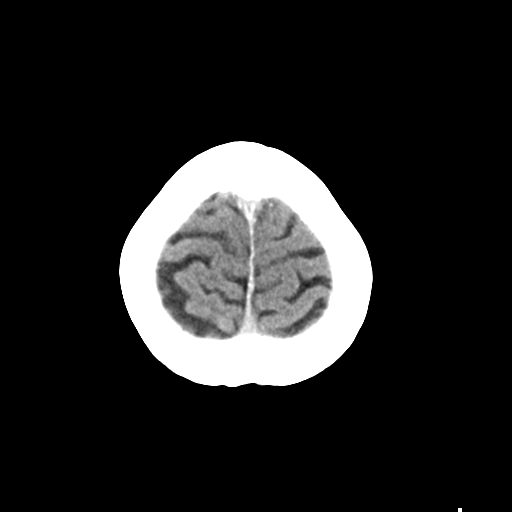
[im 24/29  bone]
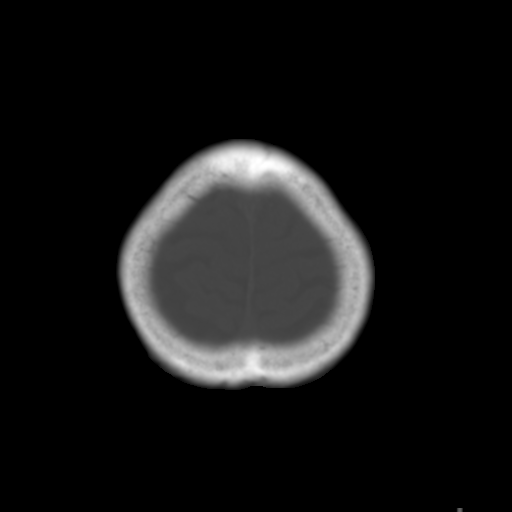
[im 27/29  brain]
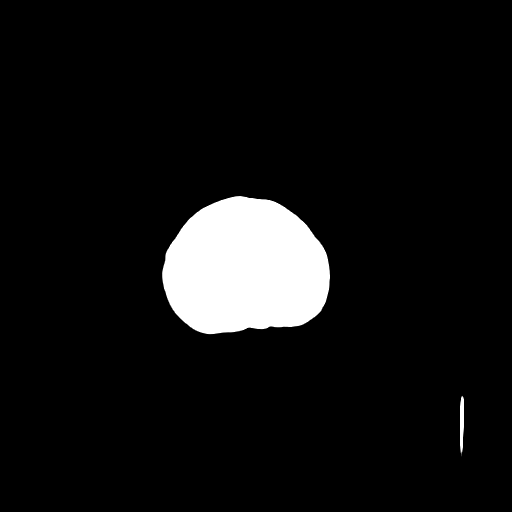

[Series 4: coronal soft tissue · coronal · 0.29mm/px · 3 of 63 slices shown]
[im 21/63  brain]
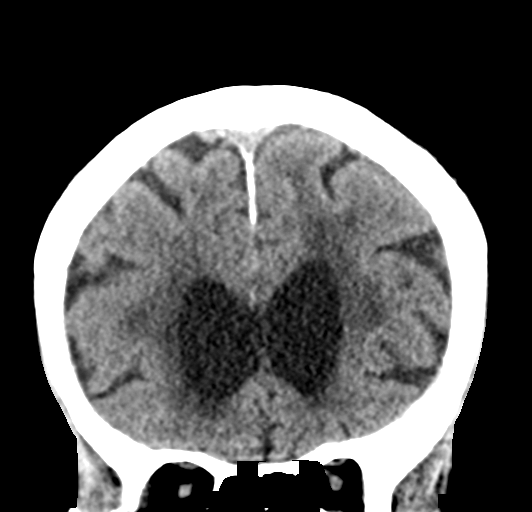
[im 28/63  brain]
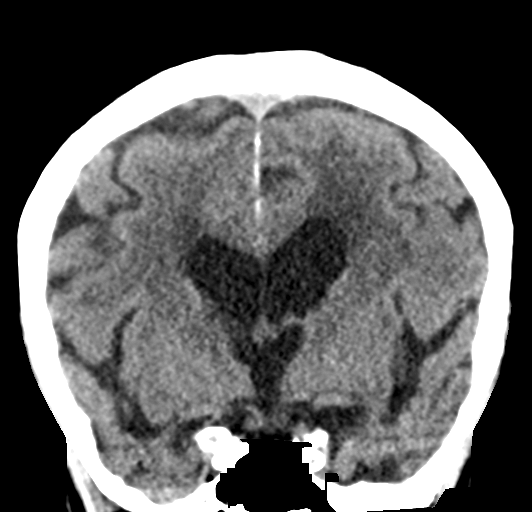
[im 35/63  brain]
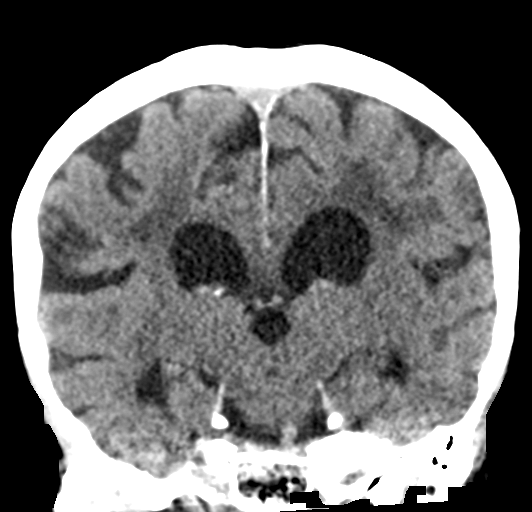

[Series 5: sagittal soft tissue · sagittal · 0.29mm/px · 3 of 53 slices shown]
[im 18/53  brain]
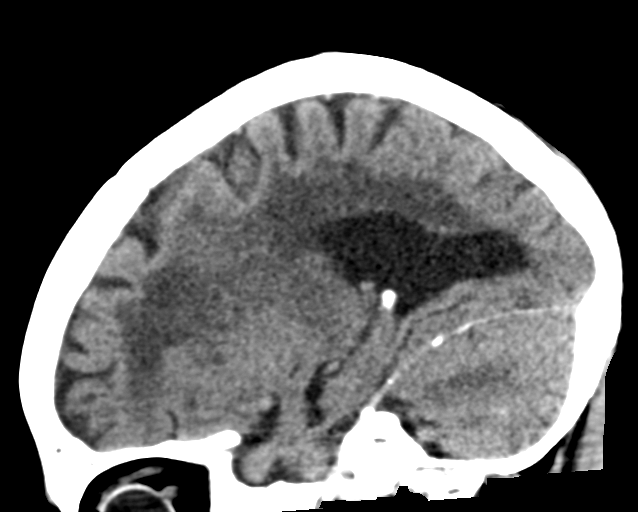
[im 27/53  brain]
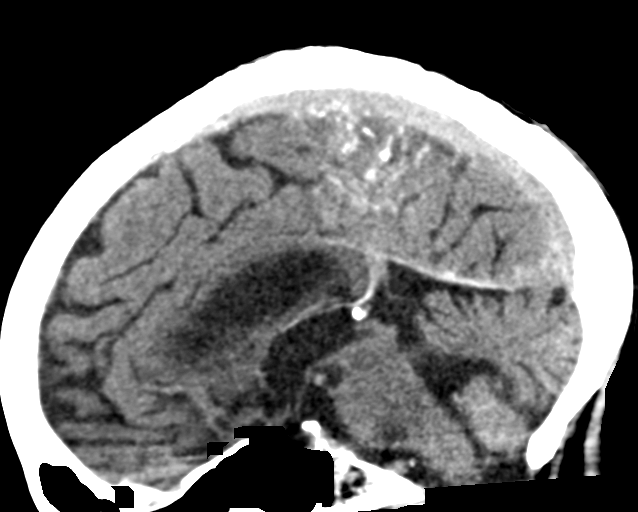
[im 35/53  brain]
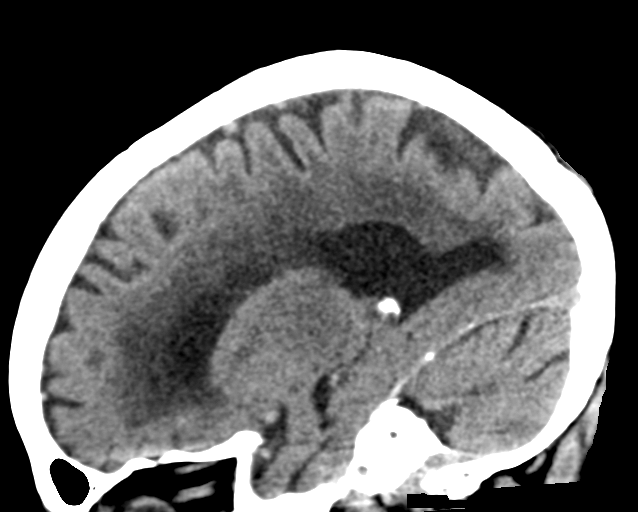

[16 of 46 positions shown; findings below may reference images not displayed]

FINDINGS: Brain: Ventricles and sulci are prominent compatible with atrophy.
Periventricular and subcortical white matter hypodensities
compatible with chronic microvascular ischemic changes. No evidence
for acute cortically based infarct, intracranial hemorrhage, mass
lesion or mass-effect.

Vascular: Unremarkable

Skull: Intact.

Sinuses/Orbits: Paranasal sinuses are well aerated. Mastoid air
cells are unremarkable. Orbits are unremarkable.

Other: None.
IMPRESSION: No acute intracranial process.

Atrophy and chronic microvascular ischemic changes.

## 2020-01-04 ENCOUNTER — Telehealth: Payer: Self-pay

## 2020-01-05 ENCOUNTER — Ambulatory Visit (INDEPENDENT_AMBULATORY_CARE_PROVIDER_SITE_OTHER): Payer: Medicare Other | Admitting: Family Medicine

## 2020-01-05 ENCOUNTER — Other Ambulatory Visit: Payer: Self-pay

## 2020-01-05 ENCOUNTER — Encounter: Payer: Self-pay | Admitting: Family Medicine

## 2020-01-05 VITALS — BP 138/58 | HR 68 | Temp 97.7°F | Wt 95.0 lb

## 2020-01-05 DIAGNOSIS — R410 Disorientation, unspecified: Secondary | ICD-10-CM | POA: Diagnosis not present

## 2020-01-05 LAB — UA/M W/RFLX CULTURE, ROUTINE
Bilirubin, UA: NEGATIVE
Glucose, UA: NEGATIVE
Ketones, UA: NEGATIVE
Leukocytes,UA: NEGATIVE
Nitrite, UA: NEGATIVE
Protein,UA: NEGATIVE
RBC, UA: NEGATIVE
Specific Gravity, UA: 1.02 (ref 1.005–1.030)
Urobilinogen, Ur: 0.2 mg/dL (ref 0.2–1.0)
pH, UA: 7.5 (ref 5.0–7.5)

## 2020-01-05 NOTE — Patient Instructions (Addendum)
Dementia Caregiver Guide Dementia is a term used to describe a number of symptoms that affect memory and thinking. The most common symptoms include:  Memory loss.  Trouble with language and communication.  Trouble concentrating.  Poor judgment.  Problems with reasoning.  Child-like behavior and language.  Extreme anxiety.  Angry outbursts.  Wandering from home or public places. Dementia usually gets worse slowly over time. In the early stages, people with dementia can stay independent and safe with some help. In later stages, they need help with daily tasks such as dressing, grooming, and using the bathroom. How to help the person with dementia cope Dementia can be frightening and confusing. Here are some tips to help the person with dementia cope with changes caused by the disease. General tips  Keep the person on track with his or her routine.  Try to identify areas where the person may need help.  Be supportive, patient, calm, and encouraging.  Gently remind the person that adjusting to changes takes time.  Help with the tasks that the person has asked for help with.  Keep the person involved in daily tasks and decisions as much as possible.  Encourage conversation, but try not to get frustrated or harried if the person struggles to find words or does not seem to appreciate your help. Communication tips  When the person is talking or seems frustrated, make eye contact and hold the person's hand.  Ask specific questions that need yes or no answers.  Use simple words, short sentences, and a calm voice. Only give one direction at a time.  When offering choices, limit them to just 1 or 2.  Avoid correcting the person in a negative way.  If the person is struggling to find the right words, gently try to help him or her. How to recognize symptoms of stress Symptoms of stress in caregivers include:  Feeling frustrated or angry with the person with  dementia.  Denying that the person has dementia or that his or her symptoms will not improve.  Feeling hopeless and unappreciated.  Difficulty sleeping.  Difficulty concentrating.  Feeling anxious, irritable, or depressed.  Developing stress-related health problems.  Feeling like you have too little time for your own life. Follow these instructions at home:   Make sure that you and the person you are caring for: ? Get regular sleep. ? Exercise regularly. ? Eat regular, nutritious meals. ? Drink enough fluid to keep your urine clear or pale yellow. ? Take over-the-counter and prescription medicines only as told by your health care providers. ? Attend all scheduled health care appointments.  Join a support group with others who are caregivers.  Ask about respite care resources so that you can have a regular break from the stress of caregiving.  Look for signs of stress in yourself and in the person you are caring for. If you notice signs of stress, take steps to manage it.  Consider any safety risks and take steps to avoid them.  Organize medications in a pill box for each day of the week.  Create a plan to handle any legal or financial matters. Get legal or financial advice if needed.  Keep a calendar in a central location to remind the person of appointments or other activities. Tips for reducing the risk of injury  Keep floors clear of clutter. Remove rugs, magazine racks, and floor lamps.  Keep hallways well lit, especially at night.  Put a handrail and nonslip mat in the bathtub or   shower.  Put childproof locks on cabinets that contain dangerous items, such as medicines, alcohol, guns, toxic cleaning items, sharp tools or utensils, matches, and lighters.  Put the locks in places where the person cannot see or reach them easily. This will help ensure that the person does not wander out of the house and get lost.  Be prepared for emergencies. Keep a list of  emergency phone numbers and addresses in a convenient area.  Remove car keys and lock garage doors so that the person does not try to get in the car and drive.  Have the person wear a bracelet that tracks locations and identifies the person as having memory problems. This should be worn at all times for safety. Where to find support: Many individuals and organizations offer support. These include:  Support groups for people with dementia and for caregivers.  Counselors or therapists.  Home health care services.  Adult day care centers. Where to find more information Alzheimer's Association: www.alz.org Contact a health care provider if:  The person's health is rapidly getting worse.  You are no longer able to care for the person.  Caring for the person is affecting your physical and emotional health.  The person threatens himself or herself, you, or anyone else. Summary  Dementia is a term used to describe a number of symptoms that affect memory and thinking.  Dementia usually gets worse slowly over time.  Take steps to reduce the person's risk of injury, and to plan for future care.  Caregivers need support, relief from caregiving, and time for their own lives. This information is not intended to replace advice given to you by your health care provider. Make sure you discuss any questions you have with your health care provider. Document Revised: 08/02/2017 Document Reviewed: 07/24/2016 Elsevier Patient Education  2020 Elsevier [employer]   Dementia Dementia is a condition that affects the way the brain works. It often affects memory and thinking. There are many types of dementia. Some types get worse with time and cannot be reversed. Some types of dementia include:  Alzheimer's disease. This is the most common type.  Vascular dementia. This type may happen due to a stroke.  Lewy body dementia. This type may happen to people who have Parkinson's disease.  Frontotemporal  dementia. This type is caused by damage to nerve cells in certain parts of the brain. Some people may have more than one type, and this is called mixed dementia. What are the causes? This condition is caused by damage to cells in the brain. Some causes that cannot be reversed include:  Having a condition that affects the blood vessels of the brain, such as diabetes, heart disease, or blood vessel disease.  Changes to genes. Some causes that can be reversed or slowed include:  Injury to the brain.  Certain medicines.  Infection.  Not having enough vitamin B12 in the body, or thyroid problems.  A tumor or blood clot in the brain. What are the signs or symptoms? Symptoms depend on the type of dementia. This may include:  Problems remembering things.  Having trouble taking a bath or putting clothes on.  Forgetting appointments.  Forgetting to pay bills.  Trouble planning and making meals.  Having trouble speaking.  Getting lost easily. How is this treated? Treatment depends on the cause of the dementia. It might include taking medicines that help:  To control the dementia.  To slow down the dementia.  To manage symptoms. In some cases, treating   the cause of your dementia can improve symptoms, reverse symptoms, or slow down how quickly it gets worse. Your doctor can help you find support groups and other doctors who can help with your care. Follow these instructions at home: Medicines  Take over-the-counter and prescription medicines only as told by your doctor.  Use a pill organizer to help you manage your medicines.  Avoidtaking medicines for pain or for sleep. Lifestyle  Make healthy choices: ? Be active as told by your doctor. ? Do not use any products that contain nicotine or tobacco, such as cigarettes, e-cigarettes, and chewing tobacco. If you need help quitting, ask your doctor. ? Do not drink alcohol. ? When you get stressed, do something that will help  you to relax. Your doctor can give you tips. ? Spend time with other people.  Make sure you get good sleep. To get good sleep: ? Try not to take naps during the day. ? Keep your bedroom dark and cool. ? In the few hours before you go to bed, try not to do any exercise. ? Do not have foods and drinks with caffeine at night. Eating and drinking  Drink enough fluid to keep your pee (urine) pale yellow.  Eat a healthy diet. General instructions   Talk with your doctor to figure out: ? What you need help with. ? What your safety needs are.  Ask your doctor if it is safe for you to drive.  If told, wear a bracelet that tracks where you are or shows that you are a person with memory loss.  Work with your family to make big decisions.  Keep all follow-up visits as told by your doctor. This is important. Contact a doctor if:  You have any new symptoms.  Your symptoms get worse.  You have problems with swallowing or choking. Get help right away if:  You feel very sad, or feel that you want to harm yourself.  You or your family members are worried for your safety. If you ever feel like you may hurt yourself or others, or have thoughts about taking your own life, get help right away. You can go to your nearest emergency department or call:  Your local emergency services (911 in the [place]  A suicide crisis helpline, such as the National Suicide Prevention Lifeline at 1-800-273-8255. This is open 24 hours a day. Summary  Dementia often affects memory and thinking.  Some types of dementia get worse with time and cannot be reversed.  Treatment for this condition depends on the cause.  Talk with your doctor to figure out what you need help with.  Your doctor can help you find support groups and other doctors who can help with your care. This information is not intended to replace advice given to you by your health care provider. Make sure you discuss any questions you have with  your health care provider. Document Revised: 11/04/2018 Document Reviewed: 11/04/2018 Elsevier Patient Education  2020 Elsevier Inc.  

## 2020-01-05 NOTE — Progress Notes (Signed)
BP (!) 138/58 (BP Location: Left Arm, Cuff Size: Normal)   Pulse 68   Temp 97.7 F (36.5 C) (Oral)   Wt 95 lb (43.1 kg)   SpO2 95%   BMI 18.55 kg/m    Subjective:    Patient ID: [name], female    DOB: 26-Oct-1927, 86 y.o.   MRN: [id number]  HPI: [name] is a 86 y.o. female  Chief Complaint  Patient presents with  . Altered Mental Status   Presents today with her daughter. Hr daughter notes that she has been getting confused. It seems like these have only been a couple of times in which she has lost her keys and her remote. She has not been been wandering and otherwise has been able to care for herself. Her daughter notes that she is concerned about her getting confused. She is also hallucinating. She is seeing men sitting in her house watching TV. They don't seem to be scaring her and she is not disturbed by it. She currently lives alone. She doesn't cook. She does drive- but her daughter notes that she has not gotten lost. She is otherwise feeling well with no other concerns or complaints at this time.   Depression screen [facility] 2/9 01/05/2020 07/21/2019 05/28/2019 12/08/2018 05/19/2018  Decreased Interest 0 0 0 0 0  Down, Depressed, Hopeless 0 0 1 0 0  PHQ - 2 Score 0 0 1 0 0  Altered sleeping 0 0 - 0 -  Tired, decreased energy 1 1 - 0 -  Change in appetite 0 0 - 0 -  Feeling bad or failure about yourself  0 0 - 0 -  Trouble concentrating 0 0 - 0 -  Moving slowly or fidgety/restless 0 0 - 0 -  Suicidal thoughts 0 0 - 0 -  PHQ-9 Score 1 1 - 0 -  Difficult doing work/chores Not difficult at all Not difficult at all - Not difficult at all -   MMSE - Mini Mental State Exam 01/05/2020  Orientation to time 4  Orientation to Place 5  Registration 3  Attention/ Calculation 5  Recall 2  Language- name 2 objects 2  Language- repeat 1  Language- follow 3 step command 3  Language- read & follow direction 1  Write a sentence 1  Copy design 1  Total score 28     Relevant past medical, surgical, family and social history reviewed and updated as indicated. Interim medical history since our last visit reviewed. Allergies and medications reviewed and updated.  Review of Systems  Constitutional: Negative.   Respiratory: Negative.   Cardiovascular: Negative.   Musculoskeletal: Negative.   Skin: Negative.   Neurological: Negative.   Psychiatric/Behavioral: Positive for behavioral problems, confusion and hallucinations. Negative for agitation, decreased concentration, dysphoric mood, self-injury, sleep disturbance and suicidal ideas. The patient is not nervous/anxious and is not hyperactive.     Per HPI unless specifically indicated above     Objective:    BP (!) 138/58 (BP Location: Left Arm, Cuff Size: Normal)   Pulse 68   Temp 97.7 F (36.5 C) (Oral)   Wt 95 lb (43.1 kg)   SpO2 95%   BMI 18.55 kg/m   Wt Readings from Last 3 Encounters:  01/05/20 95 lb (43.1 kg)  09/09/19 98 lb 3.2 oz (44.5 kg)  07/21/19 92 lb (41.7 kg)    Physical Exam Vitals and nursing note reviewed.  Constitutional:      General: She is not  in acute distress.    Appearance: Normal appearance. She is not ill-appearing, toxic-appearing or diaphoretic.     Comments: Frail appearing   HENT:     Head: Normocephalic and atraumatic.     Right Ear: External ear normal.     Left Ear: External ear normal.     Nose: Nose normal.     Mouth/Throat:     Mouth: Mucous membranes are moist.     Pharynx: Oropharynx is clear.  Eyes:     General: No scleral icterus.       Right eye: No discharge.        Left eye: No discharge.     Extraocular Movements: Extraocular movements intact.     Conjunctiva/sclera: Conjunctivae normal.     Pupils: Pupils are equal, round, and reactive to light.  Cardiovascular:     Rate and Rhythm: Normal rate and regular rhythm.     Pulses: Normal pulses.     Heart sounds: Normal heart sounds. No murmur. No friction rub. No gallop.   Pulmonary:      Effort: Pulmonary effort is normal. No respiratory distress.     Breath sounds: Normal breath sounds. No stridor. No wheezing, rhonchi or rales.  Chest:     Chest wall: No tenderness.  Musculoskeletal:        General: Normal range of motion.     Cervical back: Normal range of motion and neck supple.  Skin:    General: Skin is warm and dry.     Capillary Refill: Capillary refill takes less than 2 seconds.     Coloration: Skin is not jaundiced or pale.     Findings: No bruising, erythema, lesion or rash.  Neurological:     General: No focal deficit present.     Mental Status: She is alert and oriented to person, place, and time. Mental status is at baseline.  Psychiatric:        Mood and Affect: Mood normal.        Behavior: Behavior normal.        Thought Content: Thought content normal.        Judgment: Judgment normal.     Results for orders placed or performed in visit on 12/15/19  TSH  Result Value Ref Range   TSH 0.006 (L) 0.450 - 4.500 uIU/mL      Assessment & Plan:   Problem List Items Addressed This Visit      Nervous and Auditory   Confusion - Primary    UA clear. MMSE doing well at 28/30. Patient has been having hallucinations and mild confusion. May be early dementia. Offered referral to neurology or MRI, especially given her history of colon cancer. These were declined. Daughter would like to wait and watch. She would like to see if things change at all. Continue to monitor. Call with any concerns.       Relevant Orders   UA/M w/rflx Culture, Routine       Follow up plan: Return in about 4 weeks (around 02/02/2020).

## 2020-01-05 NOTE — Assessment & Plan Note (Signed)
UA clear. MMSE doing well at 28/30. Patient has been having hallucinations and mild confusion. May be early dementia. Offered referral to neurology or MRI, especially given her history of colon cancer. These were declined. Daughter would like to wait and watch. She would like to see if things change at all. Continue to monitor. Call with any concerns.

## 2020-01-07 DIAGNOSIS — J432 Centrilobular emphysema: Secondary | ICD-10-CM | POA: Diagnosis not present

## 2020-01-07 DIAGNOSIS — J449 Chronic obstructive pulmonary disease, unspecified: Secondary | ICD-10-CM | POA: Diagnosis not present

## 2020-01-19 ENCOUNTER — Ambulatory Visit: Payer: Self-pay | Admitting: Family Medicine

## 2020-02-05 ENCOUNTER — Other Ambulatory Visit: Payer: Self-pay

## 2020-02-05 ENCOUNTER — Ambulatory Visit (INDEPENDENT_AMBULATORY_CARE_PROVIDER_SITE_OTHER): Payer: Medicare Other | Admitting: Family Medicine

## 2020-02-05 ENCOUNTER — Encounter: Payer: Self-pay | Admitting: Family Medicine

## 2020-02-05 VITALS — BP 128/62 | HR 70 | Temp 98.0°F | Wt 95.6 lb

## 2020-02-05 DIAGNOSIS — F419 Anxiety disorder, unspecified: Secondary | ICD-10-CM

## 2020-02-05 DIAGNOSIS — F339 Major depressive disorder, recurrent, unspecified: Secondary | ICD-10-CM

## 2020-02-05 DIAGNOSIS — J432 Centrilobular emphysema: Secondary | ICD-10-CM

## 2020-02-05 DIAGNOSIS — R410 Disorientation, unspecified: Secondary | ICD-10-CM

## 2020-02-05 DIAGNOSIS — F4321 Adjustment disorder with depressed mood: Secondary | ICD-10-CM | POA: Diagnosis not present

## 2020-02-05 DIAGNOSIS — D692 Other nonthrombocytopenic purpura: Secondary | ICD-10-CM | POA: Diagnosis not present

## 2020-02-05 MED ORDER — HYDROXYZINE HCL 10 MG PO TABS: 10.0000 mg | ORAL_TABLET | Freq: Three times a day (TID) | ORAL | 3 refills | Status: DC | PRN

## 2020-02-05 NOTE — Progress Notes (Signed)
BP 128/62 (BP Location: Left Arm, Cuff Size: Normal)   Pulse 70   Temp 98 F (36.7 C) (Oral)   Wt 95 lb 9.6 oz (43.4 kg)   SpO2 94%   BMI 18.67 kg/m    Subjective:    Patient ID: [name], female    DOB: 1928/03/13, 86 y.o.   MRN: [id number]  HPI: [name] is a 86 y.o. female  Chief Complaint  Patient presents with  . Altered Mental Status    follow up - much better  . Anxiety   Has been doing well with her mental status. No more hallucinations. Has not gotten lost. Feeling more like herself. She lost her oldest daughter since her last appointment. This has made her very sad and jittery  DEPRESSION- lost her daughter about a month ago. Has been feeling a little jittery.  Mood status: exacerbated Satisfied with current treatment?: no Symptom severity: moderate  Duration of current treatment : not on anything Depressed mood: yes Anxious mood: yes Anhedonia: no Significant weight loss or gain: no Insomnia: no  Fatigue: yes Feelings of worthlessness or guilt: no Impaired concentration/indecisiveness: no Suicidal ideations: no Hopelessness: no Crying spells: yes Depression screen [facility] 2/9 02/05/2020 01/05/2020 07/21/2019 05/28/2019 12/08/2018  Decreased Interest 1 0 0 0 0  Down, Depressed, Hopeless 1 0 0 1 0  PHQ - 2 Score 2 0 0 1 0  Altered sleeping 1 0 0 - 0  Tired, decreased energy 1 1 1  - 0  Change in appetite 0 0 0 - 0  Feeling bad or failure about yourself  0 0 0 - 0  Trouble concentrating 0 0 0 - 0  Moving slowly or fidgety/restless 0 0 0 - 0  Suicidal thoughts 0 0 0 - 0  PHQ-9 Score 4 1 1  - 0  Difficult doing work/chores Somewhat difficult Not difficult at all Not difficult at all - Not difficult at all   GAD 7 : Generalized Anxiety Score 02/05/2020 12/08/2018  Nervous, Anxious, on Edge 1 0  Control/stop worrying 1 0  Worry too much - different things 1 0  Trouble relaxing 2 0  Restless 2 0  Easily annoyed or irritable 1 0  Afraid - awful  might happen 1 0  Total GAD 7 Score 9 0  Anxiety Difficulty Somewhat difficult Not difficult at all   COPD COPD status: stable Satisfied with current treatment?: yes Oxygen use: no Dyspnea frequency: occasionally Cough frequency: no Rescue inhaler frequency: rarely   Limitation of activity: no Productive cough: no Pneumovax: Up to Date Influenza: Up to Date  Relevant past medical, surgical, family and social history reviewed and updated as indicated. Interim medical history since our last visit reviewed. Allergies and medications reviewed and updated.  Review of Systems  Constitutional: Negative.   Respiratory: Negative.   Cardiovascular: Negative.   Gastrointestinal: Negative.   Musculoskeletal: Negative.   Skin: Negative.   Neurological: Negative.   Psychiatric/Behavioral: Positive for dysphoric mood. Negative for agitation, behavioral problems, confusion, decreased concentration, hallucinations, self-injury, sleep disturbance and suicidal ideas. The patient is nervous/anxious. The patient is not hyperactive.     Per HPI unless specifically indicated above     Objective:    BP 128/62 (BP Location: Left Arm, Cuff Size: Normal)   Pulse 70   Temp 98 F (36.7 C) (Oral)   Wt 95 lb 9.6 oz (43.4 kg)   SpO2 94%   BMI 18.67 kg/m   Wt Readings from  Last 3 Encounters:  02/05/20 95 lb 9.6 oz (43.4 kg)  01/05/20 95 lb (43.1 kg)  09/09/19 98 lb 3.2 oz (44.5 kg)    Physical Exam Vitals and nursing note reviewed.  Constitutional:      General: She is not in acute distress.    Appearance: Normal appearance. She is not ill-appearing, toxic-appearing or diaphoretic.  HENT:     Head: Normocephalic and atraumatic.     Right Ear: External ear normal.     Left Ear: External ear normal.     Nose: Nose normal.     Mouth/Throat:     Mouth: Mucous membranes are moist.     Pharynx: Oropharynx is clear.  Eyes:     General: No scleral icterus.       Right eye: No discharge.         Left eye: No discharge.     Extraocular Movements: Extraocular movements intact.     Conjunctiva/sclera: Conjunctivae normal.     Pupils: Pupils are equal, round, and reactive to light.  Cardiovascular:     Rate and Rhythm: Normal rate and regular rhythm.     Pulses: Normal pulses.     Heart sounds: Normal heart sounds. No murmur. No friction rub. No gallop.   Pulmonary:     Effort: Pulmonary effort is normal. No respiratory distress.     Breath sounds: Normal breath sounds. No stridor. No wheezing, rhonchi or rales.  Chest:     Chest wall: No tenderness.  Musculoskeletal:        General: Normal range of motion.     Cervical back: Normal range of motion and neck supple.  Skin:    General: Skin is warm and dry.     Capillary Refill: Capillary refill takes less than 2 seconds.     Coloration: Skin is not jaundiced or pale.     Findings: No bruising, erythema, lesion or rash.  Neurological:     General: No focal deficit present.     Mental Status: She is alert and oriented to person, place, and time. Mental status is at baseline.  Psychiatric:        Mood and Affect: Mood normal.        Behavior: Behavior normal.        Thought Content: Thought content normal.        Judgment: Judgment normal.     Results for orders placed or performed in visit on 01/05/20  UA/M w/rflx Culture, Routine   Specimen: Urine   URINE  Result Value Ref Range   Specific Gravity, UA 1.020 1.005 - 1.030   pH, UA 7.5 5.0 - 7.5   Color, UA Yellow Yellow   Appearance Ur Clear Clear   Leukocytes,UA Negative Negative   Protein,UA Negative Negative/Trace   Glucose, UA Negative Negative   Ketones, UA Negative Negative   RBC, UA Negative Negative   Bilirubin, UA Negative Negative   Urobilinogen, Ur 0.2 0.2 - 1.0 mg/dL   Nitrite, UA Negative Negative      Assessment & Plan:   Problem List Items Addressed This Visit      Cardiovascular and Mediastinum   Senile purpura (HCC)    Stable. Continue to  monitor. Call with any concerns.         Respiratory   COPD (chronic obstructive pulmonary disease) (HCC)    Lungs clear to day. Continue to monitor. Continue inhalers. Call with any concerns.  Nervous and Auditory   Confusion     Other   Depression    Mildly exacerbated. Will continue to monitor. Call with any concerns. Recheck 1-2 months.       Relevant Medications   hydrOXYzine (ATARAX/VISTARIL) 10 MG tablet   Anxiety    In exacerbation with the death of her daughter. Will start hydroxyzine. Call with any concerns. Continue to monitor.       Relevant Medications   hydrOXYzine (ATARAX/VISTARIL) 10 MG tablet    Other Visit Diagnoses    Grief    -  Primary   Will get her hooked up with social work for some grief counseling. Recheck 1-2 months.    Relevant Orders   Referral to Chronic Care Management Services       Follow up plan: Return in about 2 months (around 04/06/2020).

## 2020-02-05 NOTE — Assessment & Plan Note (Signed)
Mildly exacerbated. Will continue to monitor. Call with any concerns. Recheck 1-2 months.

## 2020-02-05 NOTE — Assessment & Plan Note (Addendum)
In exacerbation with the death of her daughter. Will start hydroxyzine. Call with any concerns. Continue to monitor.

## 2020-02-05 NOTE — Assessment & Plan Note (Signed)
Lungs clear to day. Continue to monitor. Continue inhalers. Call with any concerns.

## 2020-02-05 NOTE — Assessment & Plan Note (Signed)
Stable. Continue to monitor. Call with any concerns.  ?

## 2020-02-07 DIAGNOSIS — J449 Chronic obstructive pulmonary disease, unspecified: Secondary | ICD-10-CM | POA: Diagnosis not present

## 2020-02-07 DIAGNOSIS — J432 Centrilobular emphysema: Secondary | ICD-10-CM | POA: Diagnosis not present

## 2020-02-19 ENCOUNTER — Other Ambulatory Visit: Payer: Self-pay | Admitting: Family Medicine

## 2020-02-19 NOTE — Telephone Encounter (Signed)
Requested Prescriptions  Pending Prescriptions Disp Refills  . EUTHYROX 100 MCG tablet [Pharmacy Med Name: Euthyrox 100 MCG Oral Tablet] 30 tablet 0    Sig: Take 1 tablet by mouth once daily     Endocrinology:  Hypothyroid Agents Failed - 02/19/2020 10:37 AM      Failed - TSH needs to be rechecked within 3 months after an abnormal result. Refill until TSH is due.      Failed - TSH in normal range and within 360 days    TSH  Date Value Ref Range Status  12/15/2019 0.006 (L) 0.450 - 4.500 uIU/mL Final         Passed - Valid encounter within last 12 months    Recent Outpatient Visits          2 weeks ago [place], [place], DO   1 month ago Confusion   [place] P, DO   2 months ago Right hip pain   [place], Henrine Screws T, NP   7 months ago Essential hypertension   [place], [place], DO   1 year ago Vertigo   [place], [place], DO      Future Appointments            In 4 weeks Wynetta Emery, Barb Merino, DO [employer]

## 2020-02-20 ENCOUNTER — Other Ambulatory Visit: Payer: Self-pay | Admitting: Family Medicine

## 2020-02-20 NOTE — Telephone Encounter (Signed)
Requested Prescriptions  Pending Prescriptions Disp Refills   amLODipine (NORVASC) 2.5 MG tablet [Pharmacy Med Name: amLODIPine Besylate 2.5 MG Oral Tablet] 90 tablet 0    Sig: Take 1 tablet by mouth once daily     Cardiovascular:  Calcium Channel Blockers Passed - 02/20/2020  9:54 AM      Passed - Last BP in normal range    BP Readings from Last 1 Encounters:  02/05/20 128/62         Passed - Valid encounter within last 6 months    Recent Outpatient Visits          2 weeks ago [place], [place], DO   1 month ago Confusion   [employer], [place], DO   2 months ago Right hip pain   [place], Henrine Screws T, NP   7 months ago Essential hypertension   [place], [place], DO   1 year ago Vertigo   [place], [place], DO      Future Appointments            In 3 weeks Wynetta Emery, Barb Merino, DO [employer]

## 2020-02-22 ENCOUNTER — Ambulatory Visit: Payer: Self-pay

## 2020-02-23 NOTE — Chronic Care Management (AMB) (Signed)
  Care Management   Follow Up Note   02/23/2020 Name: [name] MRN: [id number] DOB: 1927/09/16  Referred by: Valerie Roys, DO Reason for referral : [place] is a 86 y.o. year old female who is a primary care patient of Valerie Roys, DO. The care management team was consulted for assistance with care management and care coordination needs.    Review of patient status, including review of consultants reports, relevant laboratory and other test results, and collaboration with appropriate care team members and the patient's provider was performed as part of comprehensive patient evaluation and provision of chronic care management services.    LCSW completed CCM outreach attempt today to provide grief support connection but was unable to reach patient successfully. A HIPPA compliant voice message was left encouraging patient to return call once available. LCSW rescheduled CCM SW appointment as well.  A HIPPA compliant phone message was left for the patient providing contact information and requesting a return call.   Eula Fried, BSW, MSW, [facility] [email or web address] Phone: [phone]

## 2020-03-08 DIAGNOSIS — J449 Chronic obstructive pulmonary disease, unspecified: Secondary | ICD-10-CM | POA: Diagnosis not present

## 2020-03-08 DIAGNOSIS — J432 Centrilobular emphysema: Secondary | ICD-10-CM | POA: Diagnosis not present

## 2020-03-11 ENCOUNTER — Other Ambulatory Visit
Admission: RE | Admit: 2020-03-11 | Discharge: 2020-03-11 | Disposition: A | Discharge status: Home / Self Care | Payer: Medicare Other | Attending: Surgery | Admitting: Surgery

## 2020-03-11 ENCOUNTER — Ambulatory Visit (INDEPENDENT_AMBULATORY_CARE_PROVIDER_SITE_OTHER): Payer: Medicare Other | Admitting: Surgery

## 2020-03-11 ENCOUNTER — Encounter: Payer: Self-pay | Admitting: Surgery

## 2020-03-11 ENCOUNTER — Other Ambulatory Visit: Payer: Self-pay

## 2020-03-11 DIAGNOSIS — Z85038 Personal history of other malignant neoplasm of large intestine: Secondary | ICD-10-CM | POA: Insufficient documentation

## 2020-03-11 NOTE — Patient Instructions (Addendum)
Patient will follow up in December with Dr.Piscoya with repeated CEA lab work at [facility].

## 2020-03-11 NOTE — Progress Notes (Signed)
03/11/2020  History of Present Illness: [name] is a 86 y.o. female s/p laparoscopic right colectomy on 06/2018 for colon cancer (Stage II, MSI-high).  She did not require chemotherapy.  Her CEA was 2.1 on 06/2018, 3.5 on 02/02/19, and 3.2 on 09/14/19.  She reports doing well but she fell at home yesterday trying to take the trash out and has a hematoma on her right arm.  One of her daughters passes away recently from colon cancer that had spread to liver.    She reports that she's having constipation.  She also is worried about her own cancer diagnosis and is afraid that it could come back.  She takes iron and her stools are dark.  Past Medical History: Past Medical History:  Diagnosis Date  . Anxiety   . Atrial fibrillation ([place])   . Cancer ([place])    COLON CANCER STAGE 2  . Clotting disorder (HCC)    left ankle  . COPD (chronic obstructive pulmonary disease) ([place])   . Depression   . Hypertension   . Hypothyroidism   . IDA (iron deficiency anemia)   . Melena 06/22/2018  . Obstructive chronic bronchitis without exacerbation ([place]) 12/18/2016  . Overactive bladder   . Thyroid disease      Past Surgical History: Past Surgical History:  Procedure Laterality Date  . ABDOMINAL HYSTERECTOMY    . BREAST LUMPECTOMY    . COLONOSCOPY Left 06/23/2018   Procedure: COLONOSCOPY;  Surgeon: Jonathon Bellows, MD;  [facility] END[facility]COPY;  Service: Gastroenterology;  Laterality: Left;  Colonoscopy first. Dr. Vicente Males will decide if EGD is needed after colonoscopy  . COLONOSCOPY WITH PROPOFOL N/A 06/24/2019   Procedure: COLONOSCOPY WITH PROPOFOL;  Surgeon: Toledo, Benay Pike, MD;  Location: [facility] ENDOSCOPY;  Service: Gastroenterology;  Laterality: N/A;  . ESOPHAGOGASTRODUODENOSCOPY Left 06/23/2018   Procedure: ESOPHAGOGASTRODUODENOSCOPY (EGD);  Surgeon: Jonathon Bellows, MD;  Location[facility] ENDOSCOPY;  Service: Gastroenterology;  Laterality: Left;  Colonoscopy first. Dr. Vicente Males will decide if EGD is needed  after colonoscopy  . LAPAROSCOPIC RIGHT COLECTOMY Right 06/26/2018   Procedure: LAPAROSCOPIC RIGHT COLECTOMY;  Surgeon: Olean Ree, MD;  Location: [facility] ORS;  Service: General;  Laterality: Right;    Home Medications: Prior to Admission medications   Medication Sig Start Date End Date Taking? Authorizing Provider  acetaminophen (TYLENOL) 325 MG tablet Take 2 tablets (650 mg total) by mouth every 6 (six) hours as needed for mild pain or fever. 07/01/18  Yes Briana Farner, Jacqulyn Bath, MD  amLODipine (NORVASC) 2.5 MG tablet Take 1 tablet by mouth once daily 02/20/20  Yes Johnson, Megan P, DO  aspirin EC 81 MG tablet Take 1 tablet (81 mg total) by mouth daily. 07/21/19  Yes Johnson, Megan P, DO  EUTHYROX 100 MCG tablet Take 1 tablet by mouth once daily 02/19/20  Yes Johnson, Megan P, DO  feeding supplement, ENSURE ENLIVE, (ENSURE ENLIVE) LIQD Take 237 mLs by mouth 2 (two) times daily between meals. 07/21/19  Yes Johnson, Megan P, DO  ferrous sulfate 325 (65 FE) MG tablet Take 1 tablet (325 mg total) by mouth 3 (three) times daily with meals. 01/30/19  Yes Johnson, Megan P, DO  fluticasone (FLONASE) 50 MCG/ACT nasal spray Place 2 sprays into both nostrils daily. 07/21/19  Yes Johnson, Megan P, DO  Fluticasone-Salmeterol (ADVAIR DISKUS) 250-50 MCG/DOSE AEPB Inhale 1 puff into the lungs daily. Rinse mouth after use. 07/21/19 07/20/20 Yes Johnson, Megan P, DO  hydrOXYzine (ATARAX/VISTARIL) 10 MG tablet Take 1 tablet (10 mg total) by  mouth 3 (three) times daily as needed for anxiety. 02/05/20  Yes Johnson, Megan P, DO  latanoprost (XALATAN) 0.005 % ophthalmic solution INSTILL 1 DROP INTO EACH EYE AT BEDTIME 11/24/18  Yes [provider]  metoprolol tartrate (LOPRESSOR) 50 MG tablet Take 1 tablet (50 mg total) by mouth 2 (two) times daily. 07/21/19  Yes Johnson, Megan P, DO  multivitamin-lutein (OCUVITE-LUTEIN) CAPS capsule Take 1 capsule by mouth daily. 07/02/18  Yes Yoltzin Barg, Jacqulyn Bath, MD  OXYGEN Inhale 2 L into the  lungs. As needed during the day, and full use at bedtime   Yes [provider]  psyllium (METAMUCIL) 58.6 % powder Take 1 packet by mouth daily.   Yes [provider]  solifenacin (VESICARE) 10 MG tablet Take 1 tablet (10 mg total) by mouth daily. 07/21/19  Yes Johnson, Megan P, DO    Allergies: Allergies  Allergen Reactions  . Nortriptyline     Causes night wondering    Review of Systems: Review of Systems  Constitutional: Negative for chills and fever.  Respiratory: Negative for shortness of breath.   Cardiovascular: Negative for chest pain.  Gastrointestinal: Positive for constipation. Negative for abdominal pain, diarrhea, nausea and vomiting.  Skin: Negative for rash.    Physical Exam There were no vitals taken for this visit. CONSTITUTIONAL: No acute distress HEENT:  Normocephalic, atraumatic, extraocular motion intact. RESPIRATORY:  Lungs are clear, and breath sounds are equal bilaterally. Normal respiratory effort without pathologic use of accessory muscles. CARDIOVASCULAR: Heart is regular without murmurs, gallops, or rubs. GI: The abdomen is soft, non-distended, non-tender to palpation.  Incisions well healed.  NEUROLOGIC:  Motor and sensation is grossly normal.  Cranial nerves are grossly intact. PSYCH:  Alert and oriented to person, place and time. Affect is normal.  Labs/Imaging: None new.  Assessment and Plan: This is a 86 y.o. female s/p laparoscopic right colectomy in 06/2018 for colon cancer.  --Discussed with the patient that it is common for the stool to have darker colon when taking iron.  This does not mean though that she's having blood in her stool.  Her colonoscopy in 06/2019 was negative. --Will order repeat CEA today.  Her last one was 3.2 on 09/14/19.  Will call her with the results. --No CT scan needed at this point.  Dr. Rogue Bussing had discussed this with the patient in the past, and CT scan would not be needed given her age and  patient did not want to take any aggressive measures if it recurred. --Follow up with me in December 2021 with another CEA.  Face-to-face time spent with the patient and care providers was 15 minutes, with more than 50% of the time spent counseling, educating, and coordinating care of the patient.     Melvyn Neth, [facility]

## 2020-03-12 LAB — CEA: CEA: 3.8 ng/mL (ref 0.0–4.7)

## 2020-03-14 ENCOUNTER — Telehealth: Payer: Self-pay

## 2020-03-18 ENCOUNTER — Ambulatory Visit (INDEPENDENT_AMBULATORY_CARE_PROVIDER_SITE_OTHER): Payer: Medicare Other | Admitting: Family Medicine

## 2020-03-18 ENCOUNTER — Encounter: Payer: Self-pay | Admitting: Family Medicine

## 2020-03-18 ENCOUNTER — Other Ambulatory Visit: Payer: Self-pay

## 2020-03-18 VITALS — BP 136/58 | HR 64 | Temp 97.8°F | Wt 96.2 lb

## 2020-03-18 DIAGNOSIS — R410 Disorientation, unspecified: Secondary | ICD-10-CM | POA: Diagnosis not present

## 2020-03-18 DIAGNOSIS — E039 Hypothyroidism, unspecified: Secondary | ICD-10-CM

## 2020-03-18 DIAGNOSIS — I10 Essential (primary) hypertension: Secondary | ICD-10-CM | POA: Diagnosis not present

## 2020-03-18 DIAGNOSIS — F4321 Adjustment disorder with depressed mood: Secondary | ICD-10-CM

## 2020-03-18 DIAGNOSIS — I480 Paroxysmal atrial fibrillation: Secondary | ICD-10-CM

## 2020-03-18 DIAGNOSIS — Z13220 Encounter for screening for lipoid disorders: Secondary | ICD-10-CM | POA: Diagnosis not present

## 2020-03-18 DIAGNOSIS — D500 Iron deficiency anemia secondary to blood loss (chronic): Secondary | ICD-10-CM | POA: Diagnosis not present

## 2020-03-18 DIAGNOSIS — E440 Moderate protein-calorie malnutrition: Secondary | ICD-10-CM

## 2020-03-18 DIAGNOSIS — I701 Atherosclerosis of renal artery: Secondary | ICD-10-CM

## 2020-03-18 MED ORDER — SOLIFENACIN SUCCINATE 10 MG PO TABS: 10.0000 mg | ORAL_TABLET | Freq: Every day | ORAL | 1 refills | Status: DC

## 2020-03-18 MED ORDER — FERROUS SULFATE 325 (65 FE) MG PO TABS: 325.0000 mg | ORAL_TABLET | Freq: Three times a day (TID) | ORAL | 3 refills | Status: DC

## 2020-03-18 MED ORDER — AMLODIPINE BESYLATE 2.5 MG PO TABS: 2.5000 mg | ORAL_TABLET | Freq: Every day | ORAL | 1 refills | Status: DC

## 2020-03-18 MED ORDER — FLUTICASONE PROPIONATE 50 MCG/ACT NA SUSP: 2.0000 | Freq: Every day | NASAL | 6 refills | Status: DC

## 2020-03-18 MED ORDER — FLUTICASONE-SALMETEROL 250-50 MCG/DOSE IN AEPB: 1.0000 | INHALATION_SPRAY | Freq: Every day | RESPIRATORY_TRACT | 6 refills | Status: AC

## 2020-03-18 MED ORDER — METOPROLOL TARTRATE 50 MG PO TABS: 50.0000 mg | ORAL_TABLET | Freq: Two times a day (BID) | ORAL | 1 refills | Status: DC

## 2020-03-18 NOTE — Progress Notes (Signed)
BP (!) 136/58 (BP Location: Left Arm, Cuff Size: Normal)   Pulse 64   Temp 97.8 F (36.6 C) (Oral)   Wt 96 lb 3.2 oz (43.6 kg)   SpO2 94%   BMI 18.79 kg/m    Subjective:    Patient ID: [name], female    DOB: May 14, 1928, 86 y.o.   MRN: [id number]  HPI: MELAINIE Wise is a 86 y.o. female  Chief Complaint  Patient presents with  . Anxiety  . Depression  . Fall    slipped in water, wound on back still raw/bleeding 1 week  . Memory Loss   ANXIETY/DEPRESSION- she is doing a but better. She notes that she has been dealing with the death of her daughter and has not been crying quite so much.  Duration: Chronic Status:better Anxious mood: yes  Excessive worrying: no Irritability: no  Sweating: no Nausea: no Palpitations:no Hyperventilation: no Panic attacks: no Agoraphobia: no  Obscessions/compulsions: no Depressed mood: yes Depression screen [facility] 2/9 02/05/2020 01/05/2020 07/21/2019 05/28/2019 12/08/2018  Decreased Interest 1 0 0 0 0  Down, Depressed, Hopeless 1 0 0 1 0  PHQ - 2 Score 2 0 0 1 0  Altered sleeping 1 0 0 - 0  Tired, decreased energy 1 1 1  - 0  Change in appetite 0 0 0 - 0  Feeling bad or failure about yourself  0 0 0 - 0  Trouble concentrating 0 0 0 - 0  Moving slowly or fidgety/restless 0 0 0 - 0  Suicidal thoughts 0 0 0 - 0  PHQ-9 Score 4 1 1  - 0  Difficult doing work/chores Somewhat difficult Not difficult at all Not difficult at all - Not difficult at all   Anhedonia: no Weight changes: no Insomnia: no   Hypersomnia: no Fatigue/loss of energy: yes Feelings of worthlessness: no Feelings of guilt: no Impaired concentration/indecisiveness: no Suicidal ideations: no  Crying spells: yes Recent Stressors/Life Changes: yes   Relationship problems: no   Family stress: no     Financial stress: no    Job stress: no    Recent death/loss: yes  HYPERTENSION / HYPERLIPIDEMIA Satisfied with current treatment? yes Duration of hypertension:  chronic BP monitoring frequency: not checking BP medication side effects: no Past BP meds: amlodipine, metoprolol Medication compliance: excellent compliance Aspirin: yes Recent stressors: yes Recurrent headaches: no Visual changes: no Palpitations: no Dyspnea: no Chest pain: no Lower extremity edema: no Dizzy/lightheaded: no   HYPOTHYROIDISM Thyroid control status:stable Satisfied with current treatment? yes Medication side effects: no Medication compliance: excellent compliance Recent dose adjustment:no Fatigue: yes Cold intolerance: no Heat intolerance: no Weight gain: no Weight loss: no Constipation: no Diarrhea/loose stools: no Palpitations: no Lower extremity edema: no Anxiety/depressed mood: yes  ANEMIA Anemia status: stable Etiology of anemia: iron def Duration of anemia treatment: chronic  Compliance with treatment: good compliance Iron supplementation side effects: no Severity of anemia: moderate Fatigue: yes Decreased exercise tolerance: no  Dyspnea on exertion: no Palpitations: no Bleeding: no Pica: no  She is seeing wound care for a wound on her back following a fall. She is starting to feel better.   Daughter is concerned about her memory. She denies any changes since she was seen last time. She does not want to see neurology. Does not want to do a work up. Does not want to start medication. She notes that she lost her car at [employer] a couple of times and relied on nice people to help  her find it. She is otherwise doing well with no other concerns or complaints at this time.   Relevant past medical, surgical, family and social history reviewed and updated as indicated. Interim medical history since our last visit reviewed. Allergies and medications reviewed and updated.  Review of Systems  Constitutional: Negative.   Respiratory: Negative.   Cardiovascular: Negative.   Gastrointestinal: Negative.   Musculoskeletal: Negative.   Skin: Positive  for wound. Negative for color change, pallor and rash.  Neurological: Negative.   Psychiatric/Behavioral: Positive for dysphoric mood. Negative for agitation, behavioral problems, confusion, decreased concentration, hallucinations, self-injury, sleep disturbance and suicidal ideas. The patient is nervous/anxious. The patient is not hyperactive.     Per HPI unless specifically indicated above     Objective:    BP (!) 136/58 (BP Location: Left Arm, Cuff Size: Normal)   Pulse 64   Temp 97.8 F (36.6 C) (Oral)   Wt 96 lb 3.2 oz (43.6 kg)   SpO2 94%   BMI 18.79 kg/m   Wt Readings from Last 3 Encounters:  03/18/20 96 lb 3.2 oz (43.6 kg)  02/05/20 95 lb 9.6 oz (43.4 kg)  01/05/20 95 lb (43.1 kg)    Physical Exam Vitals and nursing note reviewed.  Constitutional:      General: She is not in acute distress.    Appearance: Normal appearance. She is not ill-appearing, toxic-appearing or diaphoretic.  HENT:     Head: Normocephalic and atraumatic.     Right Ear: External ear normal.     Left Ear: External ear normal.     Nose: Nose normal.     Mouth/Throat:     Mouth: Mucous membranes are moist.     Pharynx: Oropharynx is clear.  Eyes:     General: No scleral icterus.       Right eye: No discharge.        Left eye: No discharge.     Extraocular Movements: Extraocular movements intact.     Conjunctiva/sclera: Conjunctivae normal.     Pupils: Pupils are equal, round, and reactive to light.  Cardiovascular:     Rate and Rhythm: Normal rate and regular rhythm.     Pulses: Normal pulses.     Heart sounds: Normal heart sounds. No murmur heard.  No friction rub. No gallop.   Pulmonary:     Effort: Pulmonary effort is normal. No respiratory distress.     Breath sounds: Normal breath sounds. No stridor. No wheezing, rhonchi or rales.  Chest:     Chest wall: No tenderness.  Musculoskeletal:        General: Normal range of motion.     Cervical back: Normal range of motion and neck  supple.  Skin:    General: Skin is warm and dry.     Capillary Refill: Capillary refill takes less than 2 seconds.     Coloration: Skin is not jaundiced or pale.     Findings: No bruising, erythema, lesion or rash.  Neurological:     General: No focal deficit present.     Mental Status: She is alert and oriented to person, place, and time. Mental status is at baseline.  Psychiatric:        Mood and Affect: Mood normal.        Behavior: Behavior normal.        Thought Content: Thought content normal.        Judgment: Judgment normal.     Results for orders placed or  performed in visit on 03/18/20  CBC with Differential/Platelet  Result Value Ref Range   WBC 7.4 3.4 - 10.8 x10E3/uL   RBC 4.45 3.77 - 5.28 x10E6/uL   Hemoglobin 13.3 11.1 - 15.9 g/dL   Hematocrit 38.9 34.0 - 46.6 %   MCV 87 79 - 97 fL   MCH 29.9 26.6 - 33.0 pg   MCHC 34.2 31 - 35 g/dL   RDW 12.7 11.7 - 15.4 %   Platelets 194 150 - 450 x10E3/uL   Neutrophils 63 Not Estab. %   Lymphs 25 Not Estab. %   Monocytes 9 Not Estab. %   Eos 2 Not Estab. %   Basos 1 Not Estab. %   Neutrophils Absolute 4.7 1 - 7 x10E3/uL   Lymphocytes Absolute 1.8 0 - 3 x10E3/uL   Monocytes Absolute 0.7 0 - 0 x10E3/uL   EOS (ABSOLUTE) 0.1 0.0 - 0.4 x10E3/uL   Basophils Absolute 0.1 0 - 0 x10E3/uL   Immature Granulocytes 0 Not Estab. %   Immature Grans (Abs) 0.0 0.0 - 0.1 x10E3/uL  Comprehensive metabolic panel  Result Value Ref Range   Glucose 115 (H) 65 - 99 mg/dL   BUN 20 8 - 27 mg/dL   Creatinine, Ser 1.48 (H) 0.57 - 1.00 mg/dL   GFR calc non Af Amer 31 (L) >59 mL/min/1.73   GFR calc Af Amer 36 (L) >59 mL/min/1.73   BUN/Creatinine Ratio 14 12 - 28   Sodium 136 134 - 144 mmol/L   Potassium 4.9 3.5 - 5.2 mmol/L   Chloride 99 96 - 106 mmol/L   CO2 25 20 - 29 mmol/L   Calcium 9.3 8.7 - 10.3 mg/dL   Total Protein 6.7 6.0 - 8.5 g/dL   Albumin 4.3 3.6 - 4.6 g/dL   Globulin, Total 2.4 1.5 - 4.5 g/dL   Albumin/Globulin Ratio 1.8 1.2  - 2.2   Bilirubin Total 0.4 0.0 - 1.2 mg/dL   Alkaline Phosphatase 91 48 - 121 IU/L   AST 21 0 - 40 IU/L   ALT 7 0 - 32 IU/L  Lipid Panel w/o Chol/HDL Ratio  Result Value Ref Range   Cholesterol, Total 149 100 - 199 mg/dL   Triglycerides 82 0 - 149 mg/dL   HDL 57 >39 mg/dL   VLDL Cholesterol Cal 16 5 - 40 mg/dL   LDL Chol Calc (NIH) 76 0 - 99 mg/dL  TSH  Result Value Ref Range   TSH 3.530 0.450 - 4.500 uIU/mL  Iron and TIBC  Result Value Ref Range   Total Iron Binding Capacity 278 250 - 450 ug/dL   UIBC 195 118 - 369 ug/dL   Iron 83 27 - 139 ug/dL   Iron Saturation 30 15 - 55 %  Ferritin  Result Value Ref Range   Ferritin 31 15.0 - 150.0 ng/mL      Assessment & Plan:   Problem List Items Addressed This Visit      Cardiovascular and Mediastinum   PAF (paroxysmal atrial fibrillation) (HCC)    In sinus rhythm today. No concerns. Continue to monitor.       Relevant Medications   metoprolol tartrate (LOPRESSOR) 50 MG tablet   amLODipine (NORVASC) 2.5 MG tablet   Essential hypertension    Under good control on current regimen. Continue current regimen. Continue to monitor. Call with any concerns. Refills given. Labs drawn today.       Relevant Medications   metoprolol tartrate (LOPRESSOR) 50 MG tablet  amLODipine (NORVASC) 2.5 MG tablet   Other Relevant Orders   Comprehensive metabolic panel (Completed)   Renal artery stenosis (HCC)    Under good control on current regimen. Continue current regimen. Continue to monitor. Call with any concerns. Refills given. Labs drawn today.       Relevant Medications   metoprolol tartrate (LOPRESSOR) 50 MG tablet   amLODipine (NORVASC) 2.5 MG tablet     Endocrine   Hypothyroidism    Rechecking labs today. Await results. Treat as needed.       Relevant Medications   metoprolol tartrate (LOPRESSOR) 50 MG tablet   Other Relevant Orders   TSH (Completed)     Nervous and Auditory   Confusion - Primary    No significant  change. Avoid going to [employer] alone. Continue to monitor. Call with any concerns.         Other   Protein-calorie malnutrition ([place])    Weight stable. Continue to monitor. Call with any concerns.      IDA (iron deficiency anemia)    Rechecking labs today. Await results. Treat as needed. Call with any concerns.       Relevant Medications   ferrous sulfate 325 (65 FE) MG tablet   Other Relevant Orders   CBC with Differential/Platelet (Completed)   Iron and TIBC (Completed)   Ferritin (Completed)    Other Visit Diagnoses    Grief       Doing a bit better. Continue to monitor. Call with any concerns. Continue medication.   Screening for cholesterol level       Labs drawn today. Await results. Treat as needed.    Relevant Orders   Lipid Panel w/o Chol/HDL Ratio (Completed)       Follow up plan: Return in about 6 months (around 09/18/2020) for wellness/physical.

## 2020-03-19 LAB — COMPREHENSIVE METABOLIC PANEL
ALT: 7 IU/L (ref 0–32)
AST: 21 IU/L (ref 0–40)
Albumin/Globulin Ratio: 1.8 (ref 1.2–2.2)
Albumin: 4.3 g/dL (ref 3.6–4.6)
Alkaline Phosphatase: 91 IU/L (ref 48–121)
BUN/Creatinine Ratio: 14 (ref 12–28)
BUN: 20 mg/dL (ref 8–27)
Bilirubin Total: 0.4 mg/dL (ref 0.0–1.2)
CO2: 25 mmol/L (ref 20–29)
Calcium: 9.3 mg/dL (ref 8.7–10.3)
Chloride: 99 mmol/L (ref 96–106)
Creatinine, Ser: 1.48 mg/dL — ABNORMAL HIGH (ref 0.57–1.00)
GFR calc Af Amer: 36 mL/min/{1.73_m2} — ABNORMAL LOW (ref >59)
GFR calc non Af Amer: 31 mL/min/{1.73_m2} — ABNORMAL LOW (ref >59)
Globulin, Total: 2.4 g/dL (ref 1.5–4.5)
Glucose: 115 mg/dL — ABNORMAL HIGH (ref 65–99)
Potassium: 4.9 mmol/L (ref 3.5–5.2)
Sodium: 136 mmol/L (ref 134–144)
Total Protein: 6.7 g/dL (ref 6.0–8.5)

## 2020-03-19 LAB — IRON AND TIBC
Iron Saturation: 30 % (ref 15–55)
Iron: 83 ug/dL (ref 27–139)
Total Iron Binding Capacity: 278 ug/dL (ref 250–450)
UIBC: 195 ug/dL (ref 118–369)

## 2020-03-19 LAB — LIPID PANEL W/O CHOL/HDL RATIO
Cholesterol, Total: 149 mg/dL (ref 100–199)
HDL: 57 mg/dL (ref >39)
LDL Chol Calc (NIH): 76 mg/dL (ref 0–99)
Triglycerides: 82 mg/dL (ref 0–149)
VLDL Cholesterol Cal: 16 mg/dL (ref 5–40)

## 2020-03-19 LAB — CBC WITH DIFFERENTIAL/PLATELET
Basophils Absolute: 0.1 10*3/uL (ref 0.0–0.2)
Basos: 1 %
EOS (ABSOLUTE): 0.1 10*3/uL (ref 0.0–0.4)
Eos: 2 %
Hematocrit: 38.9 % (ref 34.0–46.6)
Hemoglobin: 13.3 g/dL (ref 11.1–15.9)
Immature Grans (Abs): 0 10*3/uL (ref 0.0–0.1)
Immature Granulocytes: 0 %
Lymphocytes Absolute: 1.8 10*3/uL (ref 0.7–3.1)
Lymphs: 25 %
MCH: 29.9 pg (ref 26.6–33.0)
MCHC: 34.2 g/dL (ref 31.5–35.7)
MCV: 87 fL (ref 79–97)
Monocytes Absolute: 0.7 10*3/uL (ref 0.1–0.9)
Monocytes: 9 %
Neutrophils Absolute: 4.7 10*3/uL (ref 1.4–7.0)
Neutrophils: 63 %
Platelets: 194 10*3/uL (ref 150–450)
RBC: 4.45 x10E6/uL (ref 3.77–5.28)
RDW: 12.7 % (ref 11.7–15.4)
WBC: 7.4 10*3/uL (ref 3.4–10.8)

## 2020-03-19 LAB — TSH: TSH: 3.53 u[IU]/mL (ref 0.450–4.500)

## 2020-03-19 LAB — FERRITIN: Ferritin: 31 ng/mL (ref 15–150)

## 2020-03-20 ENCOUNTER — Encounter: Payer: Self-pay | Admitting: Family Medicine

## 2020-03-20 NOTE — Assessment & Plan Note (Signed)
Under good control on current regimen. Continue current regimen. Continue to monitor. Call with any concerns. Refills given. Labs drawn today.   

## 2020-03-20 NOTE — Assessment & Plan Note (Signed)
No significant change. Avoid going to [employer] alone. Continue to monitor. Call with any concerns.

## 2020-03-20 NOTE — Assessment & Plan Note (Signed)
Weight stable. Continue to monitor. Call with any concerns.  

## 2020-03-20 NOTE — Assessment & Plan Note (Signed)
Rechecking labs today. Await results. Treat as needed.  °

## 2020-03-20 NOTE — Assessment & Plan Note (Signed)
In sinus rhythm today. No concerns. Continue to monitor.

## 2020-03-20 NOTE — Assessment & Plan Note (Signed)
Rechecking labs today. Await results. Treat as needed. Call with any concerns.  

## 2020-03-21 ENCOUNTER — Encounter: Payer: Self-pay | Admitting: Family Medicine

## 2020-03-21 DIAGNOSIS — T148XXA Other injury of unspecified body region, initial encounter: Secondary | ICD-10-CM | POA: Diagnosis not present

## 2020-03-21 DIAGNOSIS — Z5189 Encounter for other specified aftercare: Secondary | ICD-10-CM | POA: Diagnosis not present

## 2020-03-21 MED ORDER — LEVOTHYROXINE SODIUM 100 MCG PO TABS: 100.0000 ug | ORAL_TABLET | Freq: Every day | ORAL | 3 refills | Status: DC

## 2020-04-08 ENCOUNTER — Emergency Department: Payer: Medicare Other

## 2020-04-08 ENCOUNTER — Other Ambulatory Visit: Payer: Self-pay

## 2020-04-08 ENCOUNTER — Inpatient Hospital Stay
Admission: EM | Admit: 2020-04-08 | Discharge: 2020-04-12 | DRG: 563 | Disposition: A | Discharge status: Skilled Nursing Facility | Payer: Medicare Other | Attending: Student | Admitting: Student

## 2020-04-08 DIAGNOSIS — Z79890 Hormone replacement therapy (postmenopausal): Secondary | ICD-10-CM

## 2020-04-08 DIAGNOSIS — R778 Other specified abnormalities of plasma proteins: Secondary | ICD-10-CM | POA: Diagnosis not present

## 2020-04-08 DIAGNOSIS — D509 Iron deficiency anemia, unspecified: Secondary | ICD-10-CM | POA: Diagnosis present

## 2020-04-08 DIAGNOSIS — E569 Vitamin deficiency, unspecified: Secondary | ICD-10-CM | POA: Diagnosis not present

## 2020-04-08 DIAGNOSIS — R2681 Unsteadiness on feet: Secondary | ICD-10-CM | POA: Diagnosis not present

## 2020-04-08 DIAGNOSIS — J90 Pleural effusion, not elsewhere classified: Secondary | ICD-10-CM | POA: Diagnosis present

## 2020-04-08 DIAGNOSIS — R488 Other symbolic dysfunctions: Secondary | ICD-10-CM | POA: Diagnosis not present

## 2020-04-08 DIAGNOSIS — M546 Pain in thoracic spine: Secondary | ICD-10-CM | POA: Diagnosis not present

## 2020-04-08 DIAGNOSIS — I361 Nonrheumatic tricuspid (valve) insufficiency: Secondary | ICD-10-CM | POA: Diagnosis not present

## 2020-04-08 DIAGNOSIS — M2669 Other specified disorders of temporomandibular joint: Secondary | ICD-10-CM | POA: Diagnosis not present

## 2020-04-08 DIAGNOSIS — F419 Anxiety disorder, unspecified: Secondary | ICD-10-CM | POA: Diagnosis present

## 2020-04-08 DIAGNOSIS — D696 Thrombocytopenia, unspecified: Secondary | ICD-10-CM | POA: Diagnosis present

## 2020-04-08 DIAGNOSIS — M25519 Pain in unspecified shoulder: Secondary | ICD-10-CM | POA: Diagnosis not present

## 2020-04-08 DIAGNOSIS — S42212A Unspecified displaced fracture of surgical neck of left humerus, initial encounter for closed fracture: Secondary | ICD-10-CM | POA: Diagnosis not present

## 2020-04-08 DIAGNOSIS — Z20822 Contact with and (suspected) exposure to covid-19: Secondary | ICD-10-CM | POA: Diagnosis present

## 2020-04-08 DIAGNOSIS — F329 Major depressive disorder, single episode, unspecified: Secondary | ICD-10-CM | POA: Diagnosis present

## 2020-04-08 DIAGNOSIS — R55 Syncope and collapse: Secondary | ICD-10-CM | POA: Diagnosis present

## 2020-04-08 DIAGNOSIS — Y9301 Activity, walking, marching and hiking: Secondary | ICD-10-CM | POA: Diagnosis present

## 2020-04-08 DIAGNOSIS — D72825 Bandemia: Secondary | ICD-10-CM | POA: Diagnosis not present

## 2020-04-08 DIAGNOSIS — Z681 Body mass index (BMI) 19 or less, adult: Secondary | ICD-10-CM

## 2020-04-08 DIAGNOSIS — Z9181 History of falling: Secondary | ICD-10-CM

## 2020-04-08 DIAGNOSIS — S0511XA Contusion of eyeball and orbital tissues, right eye, initial encounter: Secondary | ICD-10-CM | POA: Diagnosis not present

## 2020-04-08 DIAGNOSIS — Z743 Need for continuous supervision: Secondary | ICD-10-CM | POA: Diagnosis not present

## 2020-04-08 DIAGNOSIS — R609 Edema, unspecified: Secondary | ICD-10-CM | POA: Diagnosis not present

## 2020-04-08 DIAGNOSIS — I499 Cardiac arrhythmia, unspecified: Secondary | ICD-10-CM | POA: Diagnosis not present

## 2020-04-08 DIAGNOSIS — N189 Chronic kidney disease, unspecified: Secondary | ICD-10-CM | POA: Diagnosis not present

## 2020-04-08 DIAGNOSIS — R000 Tachycardia, unspecified: Secondary | ICD-10-CM | POA: Diagnosis not present

## 2020-04-08 DIAGNOSIS — I10 Essential (primary) hypertension: Secondary | ICD-10-CM | POA: Diagnosis not present

## 2020-04-08 DIAGNOSIS — M6281 Muscle weakness (generalized): Secondary | ICD-10-CM | POA: Diagnosis not present

## 2020-04-08 DIAGNOSIS — H9190 Unspecified hearing loss, unspecified ear: Secondary | ICD-10-CM | POA: Diagnosis not present

## 2020-04-08 DIAGNOSIS — M5137 Other intervertebral disc degeneration, lumbosacral region: Secondary | ICD-10-CM | POA: Diagnosis not present

## 2020-04-08 DIAGNOSIS — M5136 Other intervertebral disc degeneration, lumbar region: Secondary | ICD-10-CM | POA: Diagnosis not present

## 2020-04-08 DIAGNOSIS — Z9981 Dependence on supplemental oxygen: Secondary | ICD-10-CM

## 2020-04-08 DIAGNOSIS — Z9049 Acquired absence of other specified parts of digestive tract: Secondary | ICD-10-CM | POA: Diagnosis not present

## 2020-04-08 DIAGNOSIS — W19XXXA Unspecified fall, initial encounter: Secondary | ICD-10-CM | POA: Diagnosis present

## 2020-04-08 DIAGNOSIS — I129 Hypertensive chronic kidney disease with stage 1 through stage 4 chronic kidney disease, or unspecified chronic kidney disease: Secondary | ICD-10-CM | POA: Diagnosis present

## 2020-04-08 DIAGNOSIS — W010XXA Fall on same level from slipping, tripping and stumbling without subsequent striking against object, initial encounter: Secondary | ICD-10-CM | POA: Diagnosis present

## 2020-04-08 DIAGNOSIS — S199XXA Unspecified injury of neck, initial encounter: Secondary | ICD-10-CM | POA: Diagnosis not present

## 2020-04-08 DIAGNOSIS — N1831 Chronic kidney disease, stage 3a: Secondary | ICD-10-CM | POA: Diagnosis not present

## 2020-04-08 DIAGNOSIS — R410 Disorientation, unspecified: Secondary | ICD-10-CM | POA: Diagnosis not present

## 2020-04-08 DIAGNOSIS — J449 Chronic obstructive pulmonary disease, unspecified: Secondary | ICD-10-CM | POA: Diagnosis present

## 2020-04-08 DIAGNOSIS — I7090 Unspecified atherosclerosis: Secondary | ICD-10-CM | POA: Diagnosis not present

## 2020-04-08 DIAGNOSIS — J9611 Chronic respiratory failure with hypoxia: Secondary | ICD-10-CM | POA: Diagnosis not present

## 2020-04-08 DIAGNOSIS — D72829 Elevated white blood cell count, unspecified: Secondary | ICD-10-CM | POA: Diagnosis present

## 2020-04-08 DIAGNOSIS — S42252A Displaced fracture of greater tuberosity of left humerus, initial encounter for closed fracture: Secondary | ICD-10-CM | POA: Diagnosis not present

## 2020-04-08 DIAGNOSIS — J432 Centrilobular emphysema: Secondary | ICD-10-CM | POA: Diagnosis not present

## 2020-04-08 DIAGNOSIS — Z7189 Other specified counseling: Secondary | ICD-10-CM | POA: Diagnosis not present

## 2020-04-08 DIAGNOSIS — S0010XA Contusion of unspecified eyelid and periocular area, initial encounter: Secondary | ICD-10-CM | POA: Diagnosis not present

## 2020-04-08 DIAGNOSIS — Z79899 Other long term (current) drug therapy: Secondary | ICD-10-CM

## 2020-04-08 DIAGNOSIS — Y92009 Unspecified place in unspecified non-institutional (private) residence as the place of occurrence of the external cause: Secondary | ICD-10-CM

## 2020-04-08 DIAGNOSIS — Z789 Other specified health status: Secondary | ICD-10-CM | POA: Diagnosis not present

## 2020-04-08 DIAGNOSIS — Z888 Allergy status to other drugs, medicaments and biological substances status: Secondary | ICD-10-CM

## 2020-04-08 DIAGNOSIS — R5381 Other malaise: Secondary | ICD-10-CM | POA: Diagnosis not present

## 2020-04-08 DIAGNOSIS — Z515 Encounter for palliative care: Secondary | ICD-10-CM | POA: Diagnosis not present

## 2020-04-08 DIAGNOSIS — S42212D Unspecified displaced fracture of surgical neck of left humerus, subsequent encounter for fracture with routine healing: Secondary | ICD-10-CM | POA: Diagnosis not present

## 2020-04-08 DIAGNOSIS — S3992XA Unspecified injury of lower back, initial encounter: Secondary | ICD-10-CM | POA: Diagnosis not present

## 2020-04-08 DIAGNOSIS — N179 Acute kidney failure, unspecified: Secondary | ICD-10-CM | POA: Diagnosis not present

## 2020-04-08 DIAGNOSIS — S0083XA Contusion of other part of head, initial encounter: Secondary | ICD-10-CM | POA: Diagnosis present

## 2020-04-08 DIAGNOSIS — S42292A Other displaced fracture of upper end of left humerus, initial encounter for closed fracture: Secondary | ICD-10-CM | POA: Diagnosis not present

## 2020-04-08 DIAGNOSIS — Z23 Encounter for immunization: Secondary | ICD-10-CM

## 2020-04-08 DIAGNOSIS — Z85038 Personal history of other malignant neoplasm of large intestine: Secondary | ICD-10-CM | POA: Diagnosis not present

## 2020-04-08 DIAGNOSIS — M26601 Right temporomandibular joint disorder, unspecified: Secondary | ICD-10-CM | POA: Diagnosis not present

## 2020-04-08 DIAGNOSIS — E871 Hypo-osmolality and hyponatremia: Secondary | ICD-10-CM | POA: Diagnosis not present

## 2020-04-08 DIAGNOSIS — N3281 Overactive bladder: Secondary | ICD-10-CM | POA: Diagnosis present

## 2020-04-08 DIAGNOSIS — R220 Localized swelling, mass and lump, head: Secondary | ICD-10-CM | POA: Diagnosis not present

## 2020-04-08 DIAGNOSIS — Z800 Family history of malignant neoplasm of digestive organs: Secondary | ICD-10-CM

## 2020-04-08 DIAGNOSIS — Z7401 Bed confinement status: Secondary | ICD-10-CM | POA: Diagnosis not present

## 2020-04-08 DIAGNOSIS — R262 Difficulty in walking, not elsewhere classified: Secondary | ICD-10-CM | POA: Diagnosis not present

## 2020-04-08 DIAGNOSIS — Z86718 Personal history of other venous thrombosis and embolism: Secondary | ICD-10-CM

## 2020-04-08 DIAGNOSIS — E860 Dehydration: Secondary | ICD-10-CM | POA: Diagnosis present

## 2020-04-08 DIAGNOSIS — Z8249 Family history of ischemic heart disease and other diseases of the circulatory system: Secondary | ICD-10-CM

## 2020-04-08 DIAGNOSIS — I480 Paroxysmal atrial fibrillation: Secondary | ICD-10-CM | POA: Diagnosis not present

## 2020-04-08 DIAGNOSIS — R0902 Hypoxemia: Secondary | ICD-10-CM | POA: Diagnosis not present

## 2020-04-08 DIAGNOSIS — Z66 Do not resuscitate: Secondary | ICD-10-CM | POA: Diagnosis present

## 2020-04-08 DIAGNOSIS — S0993XA Unspecified injury of face, initial encounter: Secondary | ICD-10-CM | POA: Diagnosis not present

## 2020-04-08 DIAGNOSIS — Z820 Family history of epilepsy and other diseases of the nervous system: Secondary | ICD-10-CM

## 2020-04-08 DIAGNOSIS — R54 Age-related physical debility: Secondary | ICD-10-CM | POA: Diagnosis present

## 2020-04-08 DIAGNOSIS — I4891 Unspecified atrial fibrillation: Secondary | ICD-10-CM | POA: Diagnosis not present

## 2020-04-08 DIAGNOSIS — E039 Hypothyroidism, unspecified: Secondary | ICD-10-CM | POA: Diagnosis present

## 2020-04-08 DIAGNOSIS — Z87891 Personal history of nicotine dependence: Secondary | ICD-10-CM

## 2020-04-08 DIAGNOSIS — E611 Iron deficiency: Secondary | ICD-10-CM | POA: Diagnosis not present

## 2020-04-08 DIAGNOSIS — R1312 Dysphagia, oropharyngeal phase: Secondary | ICD-10-CM | POA: Diagnosis not present

## 2020-04-08 DIAGNOSIS — H269 Unspecified cataract: Secondary | ICD-10-CM | POA: Diagnosis present

## 2020-04-08 DIAGNOSIS — N183 Chronic kidney disease, stage 3 unspecified: Secondary | ICD-10-CM

## 2020-04-08 DIAGNOSIS — M4696 Unspecified inflammatory spondylopathy, lumbar region: Secondary | ICD-10-CM | POA: Diagnosis not present

## 2020-04-08 DIAGNOSIS — M2550 Pain in unspecified joint: Secondary | ICD-10-CM | POA: Diagnosis not present

## 2020-04-08 DIAGNOSIS — S50311A Abrasion of right elbow, initial encounter: Secondary | ICD-10-CM | POA: Diagnosis present

## 2020-04-08 DIAGNOSIS — N1832 Chronic kidney disease, stage 3b: Secondary | ICD-10-CM | POA: Diagnosis present

## 2020-04-08 DIAGNOSIS — G9389 Other specified disorders of brain: Secondary | ICD-10-CM | POA: Diagnosis not present

## 2020-04-08 DIAGNOSIS — I351 Nonrheumatic aortic (valve) insufficiency: Secondary | ICD-10-CM | POA: Diagnosis not present

## 2020-04-08 LAB — URINALYSIS, COMPLETE (UACMP) WITH MICROSCOPIC
Bacteria, UA: NONE SEEN
Bilirubin Urine: NEGATIVE
Glucose, UA: NEGATIVE mg/dL
Ketones, ur: 5 mg/dL — AB
Leukocytes,Ua: NEGATIVE
Nitrite: NEGATIVE
Protein, ur: NEGATIVE mg/dL
Specific Gravity, Urine: 1.011 (ref 1.005–1.030)
Squamous Epithelial / HPF: NONE SEEN (ref 0–5)
pH: 7 (ref 5.0–8.0)

## 2020-04-08 LAB — CBC
HCT: 38.5 % (ref 36.0–46.0)
Hemoglobin: 13.2 g/dL (ref 12.0–15.0)
MCH: 29.5 pg (ref 26.0–34.0)
MCHC: 34.3 g/dL (ref 30.0–36.0)
MCV: 85.9 fL (ref 80.0–100.0)
Platelets: 183 10*3/uL (ref 150–400)
RBC: 4.48 MIL/uL (ref 3.87–5.11)
RDW: 13.5 % (ref 11.5–15.5)
WBC: 14.3 10*3/uL — ABNORMAL HIGH (ref 4.0–10.5)
nRBC: 0 % (ref 0.0–0.2)

## 2020-04-08 LAB — BASIC METABOLIC PANEL
Anion gap: 10 (ref 5–15)
BUN: 21 mg/dL (ref 8–23)
CO2: 24 mmol/L (ref 22–32)
Calcium: 8.9 mg/dL (ref 8.9–10.3)
Chloride: 100 mmol/L (ref 98–111)
Creatinine, Ser: 1.22 mg/dL — ABNORMAL HIGH (ref 0.44–1.00)
GFR calc Af Amer: 45 mL/min — ABNORMAL LOW (ref >60)
GFR calc non Af Amer: 39 mL/min — ABNORMAL LOW (ref >60)
Glucose, Bld: 141 mg/dL — ABNORMAL HIGH (ref 70–99)
Potassium: 4.5 mmol/L (ref 3.5–5.1)
Sodium: 134 mmol/L — ABNORMAL LOW (ref 135–145)

## 2020-04-08 LAB — TROPONIN I (HIGH SENSITIVITY)
Troponin I (High Sensitivity): 924 ng/L (ref <18)
Troponin I (High Sensitivity): 1176 ng/L (ref <18)

## 2020-04-08 LAB — SARS CORONAVIRUS 2 BY RT PCR (HOSPITAL ORDER, PERFORMED IN ~~LOC~~ HOSPITAL LAB): SARS Coronavirus 2: NEGATIVE

## 2020-04-08 MED ORDER — HYDROCODONE-ACETAMINOPHEN 5-325 MG PO TABS
1.0000 | ORAL_TABLET | Freq: Once | ORAL | Status: DC
  Filled 2020-04-08: qty 1

## 2020-04-08 MED ORDER — ALBUTEROL SULFATE (2.5 MG/3ML) 0.083% IN NEBU: 2.5000 mg | INHALATION_SOLUTION | RESPIRATORY_TRACT | Status: DC | PRN

## 2020-04-08 MED ORDER — SODIUM CHLORIDE 0.9% FLUSH
3.0000 mL | Freq: Two times a day (BID) | INTRAVENOUS | Status: DC
  Administered 2020-04-08 – 2020-04-12 (×6): 3 mL via INTRAVENOUS

## 2020-04-08 MED ORDER — ONDANSETRON HCL 4 MG PO TABS: 4.0000 mg | ORAL_TABLET | Freq: Four times a day (QID) | ORAL | Status: DC | PRN

## 2020-04-08 MED ORDER — MORPHINE SULFATE (PF) 2 MG/ML IV SOLN
2.0000 mg | Freq: Once | INTRAVENOUS | Status: AC
  Administered 2020-04-08: 2 mg via INTRAVENOUS
  Filled 2020-04-08: qty 1

## 2020-04-08 MED ORDER — METOPROLOL TARTRATE 50 MG PO TABS
50.0000 mg | ORAL_TABLET | Freq: Two times a day (BID) | ORAL | Status: DC
  Administered 2020-04-08 – 2020-04-12 (×8): 50 mg via ORAL
  Filled 2020-04-08 (×8): qty 1

## 2020-04-08 MED ORDER — ACETAMINOPHEN 325 MG PO TABS: 650.0000 mg | ORAL_TABLET | Freq: Four times a day (QID) | ORAL | Status: DC | PRN

## 2020-04-08 MED ORDER — MOMETASONE FURO-FORMOTEROL FUM 200-5 MCG/ACT IN AERO
2.0000 | INHALATION_SPRAY | Freq: Two times a day (BID) | RESPIRATORY_TRACT | Status: DC
  Administered 2020-04-09 – 2020-04-12 (×7): 2 via RESPIRATORY_TRACT
  Filled 2020-04-08 (×2): qty 8.8

## 2020-04-08 MED ORDER — ONDANSETRON HCL 4 MG/2ML IJ SOLN
4.0000 mg | Freq: Once | INTRAMUSCULAR | Status: AC
  Administered 2020-04-08: 4 mg via INTRAVENOUS
  Filled 2020-04-08: qty 2

## 2020-04-08 MED ORDER — LIDOCAINE-EPINEPHRINE 2 %-1:100000 IJ SOLN
20.0000 mL | Freq: Once | INTRAMUSCULAR | Status: AC
  Administered 2020-04-08: 5 mL

## 2020-04-08 MED ORDER — AMLODIPINE BESYLATE 5 MG PO TABS
2.5000 mg | ORAL_TABLET | Freq: Every day | ORAL | Status: DC
  Administered 2020-04-08 – 2020-04-12 (×5): 2.5 mg via ORAL
  Filled 2020-04-08 (×5): qty 1

## 2020-04-08 MED ORDER — METOPROLOL TARTRATE 50 MG PO TABS: 50.0000 mg | ORAL_TABLET | Freq: Two times a day (BID) | ORAL | Status: DC

## 2020-04-08 MED ORDER — ENSURE ENLIVE PO LIQD
237.0000 mL | Freq: Two times a day (BID) | ORAL | Status: DC
  Administered 2020-04-09 – 2020-04-12 (×7): 237 mL via ORAL

## 2020-04-08 MED ORDER — TETANUS-DIPHTH-ACELL PERTUSSIS 5-2.5-18.5 LF-MCG/0.5 IM SUSP
0.5000 mL | Freq: Once | INTRAMUSCULAR | Status: AC
  Administered 2020-04-08: 0.5 mL via INTRAMUSCULAR
  Filled 2020-04-08: qty 0.5

## 2020-04-08 MED ORDER — LACTATED RINGERS IV SOLN: INTRAVENOUS | Status: DC

## 2020-04-08 MED ORDER — ONDANSETRON HCL 4 MG/2ML IJ SOLN: 4.0000 mg | Freq: Four times a day (QID) | INTRAMUSCULAR | Status: DC | PRN

## 2020-04-08 MED ORDER — ACETAMINOPHEN 650 MG RE SUPP: 650.0000 mg | Freq: Four times a day (QID) | RECTAL | Status: DC | PRN

## 2020-04-08 MED ORDER — LEVOTHYROXINE SODIUM 50 MCG PO TABS
100.0000 ug | ORAL_TABLET | Freq: Every day | ORAL | Status: DC
  Administered 2020-04-09 – 2020-04-12 (×4): 100 ug via ORAL
  Filled 2020-04-08 (×4): qty 2

## 2020-04-08 MED ORDER — HYDROXYZINE HCL 10 MG PO TABS
10.0000 mg | ORAL_TABLET | Freq: Three times a day (TID) | ORAL | Status: DC | PRN
  Filled 2020-04-08: qty 1

## 2020-04-08 MED ORDER — SODIUM CHLORIDE 0.9% FLUSH: 3.0000 mL | Freq: Once | INTRAVENOUS | Status: DC

## 2020-04-08 MED ORDER — HYDROCODONE-ACETAMINOPHEN 5-325 MG PO TABS
1.0000 | ORAL_TABLET | ORAL | Status: DC | PRN
  Administered 2020-04-08: 1 via ORAL
  Administered 2020-04-09: 18:00:00 2 via ORAL
  Administered 2020-04-10 – 2020-04-11 (×3): 1 via ORAL
  Administered 2020-04-12: 2 via ORAL
  Filled 2020-04-08 (×3): qty 1
  Filled 2020-04-08: qty 2
  Filled 2020-04-08: qty 1
  Filled 2020-04-08: qty 2

## 2020-04-08 NOTE — ED Notes (Addendum)
Old EMS sheets removed from underneath pt. Pt placed on bedpan as she stated she would try to urinate. Pt understands that if she cannot urinate an I&O cath will need to be completed. Visitor remains with pt.

## 2020-04-08 NOTE — ED Provider Notes (Addendum)
[facility] Emergency Department Provider Note   ____________________________________________   First MD Initiated Contact with Patient 04/08/20 1527     (approximate)  I have reviewed the triage vital signs and the nursing notes.   HISTORY  Chief Complaint No chief complaint on file.  Patient fell hit her head.  HPI [name] is a 86 y.o. female who fell.  She does not remember how or why.  She did hit her head.  She has a cut about a centimeter and half long just below the right eyebrow.  She has a lot of swelling on the right side of the face below the eye which is tender in the right eye is actually swollen shut.  Patient complains of a lot of pain in the left shoulder.  She has an abrasion on the right elbow as well.         Past Medical History:  Diagnosis Date  . Anxiety   . Atrial fibrillation ([place])   . Cancer ([place])    COLON CANCER STAGE 2  . Clotting disorder (HCC)    left ankle  . COPD (chronic obstructive pulmonary disease) ([place])   . Depression   . Hypertension   . Hypothyroidism   . IDA (iron deficiency anemia)   . Melena 06/22/2018  . Obstructive chronic bronchitis without exacerbation ([place]) 12/18/2016  . Overactive bladder   . Thyroid disease     Patient Active Problem List   Diagnosis Date Noted  . Confusion 01/05/2020  . Right hip pain 11/24/2019  . Headache disorder 01/29/2019  . Vertigo 12/03/2018  . History of colon cancer, stage II 07/24/2018  . IDA (iron deficiency anemia) 07/24/2018  . Protein-calorie malnutrition ([place]) 06/19/2018  . Senile purpura ([place]) 06/19/2018  . Anxiety 04/28/2018  . Cataract of both eyes 04/28/2018  . Diverticulosis 04/28/2018  . DJD (degenerative joint disease) 04/28/2018  . Oxygen dependent 02/07/2018  . COPD (chronic obstructive pulmonary disease) ([place])   . Depression   . Overactive bladder   . Hypothyroidism   . History of fall 08/13/2017  . Renal artery stenosis ([place])  12/18/2016  . Chronic respiratory failure with hypoxia ([place]) 10/25/2015  . History of DVT (deep vein thrombosis) 10/11/2014  . PSVT (paroxysmal supraventricular tachycardia) ([place]) 10/22/2013  . Essential hypertension 12/20/2011  . PAF (paroxysmal atrial fibrillation) ([place]) 08/22/2011    Past Surgical History:  Procedure Laterality Date  . ABDOMINAL HYSTERECTOMY    . BREAST LUMPECTOMY    . COLONOSCOPY Left 06/23/2018   Procedure: COLONOSCOPY;  Surgeon: Jonathon Bellows, MD;  Location: [facility] ENDOSCOPY;  Service: Gastroenterology;  Laterality: Left;  Colonoscopy first. Dr. Vicente Males will decide if EGD is needed after colonoscopy  . COLONOSCOPY WITH PROPOFOL N/A 06/24/2019   Procedure: COLONOSCOPY WITH PROPOFOL;  Surgeon: Tol[place], Benay Pike, MD;  Location: [facility] ENDOSCOPY;  Service: Gastroenterology;  Laterality: N/A;  . ESOPHAGOGASTRODUODENOSCOPY Left 06/23/2018   Procedure: ESOPHAGOGASTRODUODENOSCOPY (EGD);  Surgeon: Jonathon Bellows, MD;  Location: [facility] ENDOSCOPY;  Service: Gastroenterology;  Laterality: Left;  Colonoscopy first. Dr. Vicente Males will decide if EGD is needed after colonoscopy  . LAPAROSCOPIC RIGHT COLECTOMY Right 06/26/2018   Procedure: LAPAROSCOPIC RIGHT COLECTOMY;  Surgeon: Olean Ree, MD;  Location: [facility] ORS;  Service: General;  Laterality: Right;    Prior to Admission medications   Medication Sig Start Date End Date Taking? Authorizing Provider  acetaminophen (TYLENOL) 325 MG tablet Take 2 tablets (650 mg total) by mouth every 6 (six) hours as needed for mild  pain or fever. 07/01/18   Olean Ree, MD  amLODipine (NORVASC) 2.5 MG tablet Take 1 tablet (2.5 mg total) by mouth daily. 03/18/20   Johnson, Megan P, DO  aspirin EC 81 MG tablet Take 1 tablet (81 mg total) by mouth daily. 07/21/19   Johnson, Megan P, DO  feeding supplement, ENSURE ENLIVE, (ENSURE ENLIVE) LIQD Take 237 mLs by mouth 2 (two) times daily between meals. 07/21/19   Johnson, Megan P, DO  ferrous sulfate 325 (65 FE) MG  tablet Take 1 tablet (325 mg total) by mouth 3 (three) times daily with meals. 03/18/20   Johnson, Megan P, DO  fluticasone (FLONASE) 50 MCG/ACT nasal spray Place 2 sprays into both nostrils daily. 03/18/20   Johnson, Megan P, DO  Fluticasone-Salmeterol (ADVAIR DISKUS) 250-50 MCG/DOSE AEPB Inhale 1 puff into the lungs daily. Rinse mouth after use. 03/18/20 03/18/21  Park Liter P, DO  hydrOXYzine (ATARAX/VISTARIL) 10 MG tablet Take 1 tablet (10 mg total) by mouth 3 (three) times daily as needed for anxiety. 02/05/20   Johnson, Megan P, DO  latanoprost (XALATAN) 0.005 % ophthalmic solution INSTILL 1 DROP INTO EACH EYE AT BEDTIME 11/24/18   [provider]  levothyroxine (EUTHYROX) 100 MCG tablet Take 1 tablet (100 mcg total) by mouth daily. 03/21/20   Johnson, Megan P, DO  metoprolol tartrate (LOPRESSOR) 50 MG tablet Take 1 tablet (50 mg total) by mouth 2 (two) times daily. 03/18/20   Johnson, Megan P, DO  multivitamin-lutein (OCUVITE-LUTEIN) CAPS capsule Take 1 capsule by mouth daily. 07/02/18   Olean Ree, MD  OXYGEN Inhale 2 L into the lungs. As needed during the day, and full use at bedtime    [provider]  psyllium (METAMUCIL) 58.6 % powder Take 1 packet by mouth daily.    [provider]  solifenacin (VESICARE) 10 MG tablet Take 1 tablet (10 mg total) by mouth daily. 03/18/20   Park Liter P, DO    Allergies Nortriptyline  Family History  Problem Relation Age of Onset  . Heart disease Mother   . Heart failure Mother   . Parkinson's disease Father   . Heart disease Sister   . Heart disease Son   . Colon cancer Daughter   . Liver cancer Daughter     Social History Social History   Tobacco Use  . Smoking status: Former Smoker    Years: 25.00    Types: Cigarettes    Quit date: 02/01/1986    Years since quitting: 34.2  . Smokeless tobacco: Never Used  . Tobacco comment: quit in 1987   Vaping Use  . Vaping Use: Never used  Substance Use Topics  .  Alcohol use: Never  . Drug use: Never    Review of Systems  Constitutional: No fever/chills Eyes: No visual changes although she cannot see out of the right eyes it is swollen shut. ENT: No sore throat. Cardiovascular: Denies chest pain. Respiratory: Denies shortness of breath. Gastrointestinal: No abdominal pain.  No nausea, no vomiting.  No diarrhea.  No constipation. Genitourinary: Negative for dysuria. Musculoskeletal: Negative for back pain she does complain of pain in the left shoulder. Skin: Negative for rash. Neurological: Negative for headaches, focal weakness  ____________________________________________   PHYSICAL EXAM:  VITAL SIGNS: ED Triage Vitals  Enc Vitals Group     BP 04/08/20 1505 (!) 145/68     Pulse Rate 04/08/20 1508 (!) 107     Resp 04/08/20 1503 18     Temp 04/08/20  1459 97.8 F (36.6 C)     Temp src --      SpO2 04/08/20 1510 94 %     Weight 04/08/20 1500 100 lb (45.4 kg)     Height --      Head Circumference --      Peak Flow --      Pain Score 04/08/20 1500 10     Pain Loc --      Pain Edu? --      Excl. in [place]? --     Constitutional: Alert and oriented to person and emergency room.  Complaining of pain in the left shoulder Eyes: Right eye swollen shut I cannot open it.  There is the cut as I noted in HPI Head: Swelling the right side of the face as noted in HPI Nose: No congestion/rhinnorhea. Mouth/Throat: Mucous membranes are moist.  Oropharynx non-erythematous. Neck: No stridor.  Cardiovascular: Normal rate, regular rhythm. Grossly normal heart sounds.  Good peripheral circulation. Respiratory: Normal respiratory effort.  No retractions. Lungs CTAB. Gastrointestinal: Soft and nontender. No distention. No abdominal bruits.  Musculoskeletal: No lower extremity tenderness nor edema.  Left shoulder is swollen and tender Neurologic:  Normal speech and language. No gross focal neurologic deficits are appreciated.  Skin:  Skin is warm, dry and  intact except for above described laceration and skin tear. No rash noted.   ____________________________________________   LABS (all labs ordered are listed, but only abnormal results are displayed)  Labs Reviewed  BASIC METABOLIC PANEL - Abnormal; Notable for the following components:      Result Value   Sodium 134 (*)    Glucose, Bld 141 (*)    Creatinine, Ser 1.22 (*)    GFR calc non Af Amer 39 (*)    GFR calc Af Amer 45 (*)    All other components within normal limits  CBC - Abnormal; Notable for the following components:   WBC 14.3 (*)    All other components within normal limits  URINALYSIS, COMPLETE (UACMP) WITH MICROSCOPIC - Abnormal; Notable for the following components:   Color, Urine YELLOW (*)    APPearance HAZY (*)    Hgb urine dipstick SMALL (*)    Ketones, ur 5 (*)    All other components within normal limits  CBG MONITORING, ED   ____________________________________________  EKG EKG read interpreted by me shows sinus tachycardia rate of 108 occasional premature atrial contractions normal axis no acute ST-T changes seen Chest x-ray shows the cath fracture of the proximal left humerus.  Radiology also reads a small left-sided pleural effusion.  To be very small.  X-ray of the shoulder course shows the comminuted fracture. ____________________________________________  RADIOLOGY  ED MD interpretation: CT of the head and C-spine showed no acute fractures radiology read the films are reviewed them.  Official radiology report(s): CT HEAD WO CONTRAST  Result Date: 04/08/2020 CLINICAL DATA:  Trauma, followed EXAM: CT HEAD WITHOUT CONTRAST TECHNIQUE: Contiguous axial images were obtained from the base of the skull through the vertex without intravenous contrast. COMPARISON:  11/16/2018 FINDINGS: Brain: There is no acute intracranial hemorrhage, mass effect, or edema. Gray-white differentiation is preserved. There is no extra-axial fluid collection. Confluent areas of  hypoattenuation in the supratentorial white matter are nonspecific but probably reflect advanced chronic microvascular ischemic changes similar to the prior study. Prominence of the ventricles and sulci reflects similar parenchymal volume loss. Vascular: There is atherosclerotic calcification at the skull base. Skull: Calvarium is unremarkable. Sinuses/Orbits: Refer to  maxillofacial CT. Other: Right frontal scalp and periorbital hematoma. IMPRESSION: No evidence of acute intracranial injury. Electronically Signed   By: Macy Mis M.D.   On: 04/08/2020 15:31   CT Cervical Spine Wo Contrast  Result Date: 04/08/2020 CLINICAL DATA:  Found down, facial trauma EXAM: CT CERVICAL SPINE WITHOUT CONTRAST TECHNIQUE: Multidetector CT imaging of the cervical spine was performed without intravenous contrast. Multiplanar CT image reconstructions were also generated. COMPARISON:  None. FINDINGS: Alignment: Retrolisthesis at C5-C6 and C6-C7. Skull base and vertebrae: Degenerative plate irregularity at C5, C6, C7. Vertebral body heights are otherwise maintained. There is no acute fracture identified. Soft tissues and spinal canal: No prevertebral fluid or swelling. No visible canal hematoma. Disc levels: Multilevel degenerative changes are present including disc space narrowing, endplate osteophytes, and facet and uncovertebral hypertrophy. These changes are greatest at C5-C6 and C6-C7. Upper chest: Emphysema. IMPRESSION: No acute cervical spine fracture. Electronically Signed   By: Macy Mis M.D.   On: 04/08/2020 15:38   DG Chest Portable 1 View  Result Date: 04/08/2020 CLINICAL DATA:  Fall with swelling EXAM: PORTABLE CHEST 1 VIEW COMPARISON:  11/16/2018 FINDINGS: Small left-sided pleural effusion. No consolidation. Normal heart size. Aortic atherosclerosis. No pneumothorax. Acute comminuted fracture involving left humerus neck and greater tuberosity. IMPRESSION: Small left-sided pleural effusion. Acute comminuted  fracture involving the left humerus neck and greater tuberosity. Electronically Signed   By: Donavan Foil M.D.   On: 04/08/2020 15:52   DG Shoulder Left  Result Date: 04/08/2020 CLINICAL DATA:  Pain status post fall EXAM: LEFT SHOULDER - 2+ VIEW COMPARISON:  None. FINDINGS: There is an acute impacted and mildly displaced fracture through the surgical neck of the proximal left humerus. There is a displaced fracture fragment arising from the greater tuberosity measuring approximately 2.7 cm. There are additional smaller adjacent fracture fragments. There is no dislocation. Osteopenia is noted. IMPRESSION: Neer 3 part acute fracture of the proximal left humerus without evidence for dislocation. Electronically Signed   By: Constance Holster M.D.   On: 04/08/2020 15:52   CT Maxillofacial Wo Contrast  Result Date: 04/08/2020 CLINICAL DATA:  Facial trauma EXAM: CT MAXILLOFACIAL WITHOUT CONTRAST TECHNIQUE: Multidetector CT imaging of the maxillofacial structures was performed. Multiplanar CT image reconstructions were also generated. COMPARISON:  None. FINDINGS: Osseous: No acute facial fracture. Degenerative changes of the right temporomandibular joint. Orbits: No intraorbital hematoma. Sinuses: No significant opacification. Soft tissues: Right frontal scalp, periorbital, and facial soft tissue swelling and hematoma. Limited intracranial: Dictated separately. IMPRESSION: No acute facial fracture. Electronically Signed   By: Macy Mis M.D.   On: 04/08/2020 15:33    ____________________________________________   PROCEDURES  Procedure(s) performed (including Critical Care):  Procedures oral consent obtained.  Skin cleaned with Betadine and then wound irrigated with normal saline.  Wound explored no foreign bodies were seen or felt.  Skin was closed with 3 stitches of 4-0 Prolene.  Patient tolerated well.  Bleeding seems to stop.  I still cannot open the eye because of the tense swelling.  The swelling  is not any worse however.   ____________________________________________   INITIAL IMPRESSION / ASSESSMENT AND PLAN / ED COURSE  Patient is now nauseated.  She is in a lot of pain seems a little disoriented.  Her face is very swollen she cannot move her left arm.  She lives by herself.  She may have had syncope and we can't tell.  I will see if we can admit her to the hospital at  least overnight.              ____________________________________________   FINAL CLINICAL IMPRESSION(S) / ED DIAGNOSES  Final diagnoses:  Fall, initial encounter  Syncope and collapse     ED Discharge Orders    None       Note:  This document was prepared using Dragon voice recognition software and may include unintentional dictation errors.    Nena Polio, MD 04/08/20 1741    Nena Polio, MD 04/08/20 1806

## 2020-04-08 NOTE — ED Notes (Signed)
CT staff to bedside.

## 2020-04-08 NOTE — ED Notes (Signed)
Pt assisted onto bedpan as requested.  

## 2020-04-08 NOTE — ED Notes (Signed)
Britney NT states she will come help with immobilizer soon if [name] remains off unit.

## 2020-04-08 NOTE — ED Notes (Signed)
Kadijah NT to assist this RN in placing sling/immobilizer soon.

## 2020-04-08 NOTE — ED Notes (Signed)
This RN and Elmyra Ricks NT completed I&O cath with pt's verbal okay.

## 2020-04-08 NOTE — ED Notes (Signed)
Lidocaine 2% with Epi to EDP Malinda who confirms it is the type of lidocaine he wants. Lac cart to bedside.

## 2020-04-08 NOTE — H&P (Signed)
History and Physical    [name] HXT:[id number] DOB: 1928/07/22 DOA: 04/08/2020  PCP: Valerie Roys, DO  Patient coming from: Home via EMS  I have personally briefly reviewed patient's old medical records in [place]  Chief Complaint: Fall  HPI: [name] is a 86 y.o. female with medical history significant for paroxysmal atrial fibrillation on aspirin alone, pSVT, hypertension, hypothyroidism, COPD on 2L O2 via [place] at night and with activity, CKD stage III, colon cancer s/p right colectomy, depression/anxiety, and very hard of hearing who presents to the ED for evaluation after a fall.  History is supplemented by daughter at bedside.  Patient currently lives in a senior living apartment complex.  She says she was walking down a ramp when she slipped and fell.  She suffered an injury to her face resulting in a large right-sided hematoma.  She also hit her left shoulder.  She denied any loss of consciousness.  He denied any associated chest pain, dyspnea,, lightheadedness, abdominal pain, or dysuria.  Patient's daughter states that she had a similar fall last week resulting in a skin tear to her leg.  ED Course:  Initial vitals showed BP 145/68, pulse 106, RR 25, temp 97.8 Fahrenheit, SPO2 94% on room air.  Labs show sodium 134, potassium 4.5, bicarb 24, BUN 21, creatinine 1.22, serum glucose 141, WBC 14.3, hemoglobin 13.2, platelets 183,000.  Urinalysis is negative for UTI.  SARS-CoV-2 PCR is obtained and pending.  CT head without contrast is negative for evidence of acute intracranial injury.  Right frontal scalp and periorbital hematoma are seen.  CT maxillofacial without contrast is negative for acute facial fracture or intraorbital hematoma.  CT cervical spine without contrast is negative for acute cervical spine fracture.  Left shoulder x-ray showed an acute impacted and mildly displaced fracture through the surgical neck of the proximal left humerus  and displaced fracture fragment arising from the greater tuberosity.  Portable chest x-ray shows a small left-sided pleural effusion and acute comminuted fracture involving the left humerus neck and greater tuberosity.  Patient wound was cleaned and closed by the ED physician.  Patient was given Norco, IV morphine, and Zofran.  Hospitalist service was consulted to me for further evaluation and management of possible syncopal event.  Review of Systems: All systems reviewed and are negative except as documented in history of present illness above.   Past Medical History:  Diagnosis Date  . Anxiety   . Atrial fibrillation ([place])   . Cancer ([place])    COLON CANCER STAGE 2  . Clotting disorder (H[facility])    left ankle  . COPD (chronic obstructive pulmonary disease) ([place])   . Depression   . Hypertension   . Hypothyroidism   . IDA (iron deficiency anemia)   . Melena 06/22/2018  . Obstructive chronic bronchitis without exacerbation ([place]) 12/18/2016  . Overactive bladder   . Thyroid disease     Past Surgical History:  Procedure Laterality Date  . ABDOMINAL HYSTERECTOMY    . BREAST LUMPECTOMY    . COLONOSCOPY Left 06/23/2018   Procedure: COLONOSCOPY;  Surgeon: Jonathon Bellows, MD;  Location: [facility] ENDOSCOPY;  Service: Gastroenterology;  Laterality: Left;  Colonoscopy first. Dr. Vicente Males will decide if EGD is needed after colonoscopy  . COLONOSCOPY WITH PROPOFOL N/A 06/24/2019   Procedure: COLONOSCOPY WITH PROPOFOL;  Surgeon: Toledo, Benay Pike, MD;  Location: [facility] ENDOSCOPY;  Service: Gastroenterology;  Laterality: N/A;  . ESOPHAGOGASTRODUODENOSCOPY Left 06/23/2018   Procedure: ESOPHAGOGASTRODUODENOSCOPY (EGD);  Surgeon:  Jonathon Bellows, MD;  Location: [facility] ENDOSCOPY;  Service: Gastroenterology;  Laterality: Left;  Colonoscopy first. Dr. Vicente Males will decide if EGD is needed after colonoscopy  . LAPAROSCOPIC RIGHT COLECTOMY Right 06/26/2018   Procedure: LAPAROSCOPIC RIGHT COLECTOMY;  Surgeon: Olean Ree, MD;   Location: [facility] ORS;  Service: General;  Laterality: Right;    Social History:  reports that she quit smoking about 34 years ago. Her smoking use included cigarettes. She quit after 25.00 years of use. She has never used smokeless tobacco. She reports that she does not drink alcohol and does not use drugs.  Allergies  Allergen Reactions  . Nortriptyline     Causes night wondering    Family History  Problem Relation Age of Onset  . Heart disease Mother   . Heart failure Mother   . Parkinson's disease Father   . Heart disease Sister   . Heart disease Son   . Colon cancer Daughter   . Liver cancer Daughter      Prior to Admission medications   Medication Sig Start Date End Date Taking? Authorizing Provider  acetaminophen (TYLENOL) 325 MG tablet Take 2 tablets (650 mg total) by mouth every 6 (six) hours as needed for mild pain or fever. 07/01/18   Olean Ree, MD  amLODipine (NORVASC) 2.5 MG tablet Take 1 tablet (2.5 mg total) by mouth daily. 03/18/20   Johnson, Megan P, DO  aspirin EC 81 MG tablet Take 1 tablet (81 mg total) by mouth daily. 07/21/19   Johnson, Megan P, DO  feeding supplement, ENSURE ENLIVE, (ENSURE ENLIVE) LIQD Take 237 mLs by mouth 2 (two) times daily between meals. 07/21/19   Johnson, Megan P, DO  ferrous sulfate 325 (65 FE) MG tablet Take 1 tablet (325 mg total) by mouth 3 (three) times daily with meals. 03/18/20   Johnson, Megan P, DO  fluticasone (FLONASE) 50 MCG/ACT nasal spray Place 2 sprays into both nostrils daily. 03/18/20   Johnson, Megan P, DO  Fluticasone-Salmeterol (ADVAIR DISKUS) 250-50 MCG/DOSE AEPB Inhale 1 puff into the lungs daily. Rinse mouth after use. 03/18/20 03/18/21  Park Liter P, DO  hydrOXYzine (ATARAX/VISTARIL) 10 MG tablet Take 1 tablet (10 mg total) by mouth 3 (three) times daily as needed for anxiety. 02/05/20   Johnson, Megan P, DO  latanoprost (XALATAN) 0.005 % ophthalmic solution INSTILL 1 DROP INTO EACH EYE AT BEDTIME 11/24/18    [provider]  levothyroxine (EUTHYROX) 100 MCG tablet Take 1 tablet (100 mcg total) by mouth daily. 03/21/20   Johnson, Megan P, DO  metoprolol tartrate (LOPRESSOR) 50 MG tablet Take 1 tablet (50 mg total) by mouth 2 (two) times daily. 03/18/20   Johnson, Megan P, DO  multivitamin-lutein (OCUVITE-LUTEIN) CAPS capsule Take 1 capsule by mouth daily. 07/02/18   Olean Ree, MD  OXYGEN Inhale 2 L into the lungs. As needed during the day, and full use at bedtime    [provider]  psyllium (METAMUCIL) 58.6 % powder Take 1 packet by mouth daily.    [provider]  solifenacin (VESICARE) 10 MG tablet Take 1 tablet (10 mg total) by mouth daily. 03/18/20   Valerie Roys, DO    Physical Exam: Vitals:   04/08/20 1746 04/08/20 1754 04/08/20 1800 04/08/20 1802  BP: (!) 146/74  (!) 155/78   Pulse:      Resp:  18 14   Temp:      SpO2:    94%  Weight:  Constitutional: Thin elderly woman resting supine in bed, very hard of hearing Eyes: Right facial and periorbital hematoma, unable to open right eye.  Left eye pupil reactive. ENMT: Mucous membranes are moist. Posterior pharynx clear of any exudate or lesions. Neck: normal, supple, no masses. Respiratory: clear to auscultation anteriorly. Normal respiratory effort. No accessory muscle use.  Cardiovascular: Tachycardic, no murmurs / rubs / gallops. No extremity edema. 2+ pedal pulses. Abdomen: no tenderness, no masses palpated. No hepatosplenomegaly. Bowel sounds positive.  Musculoskeletal: Left shoulder internally rotated and ROM diminished due to humeral neck fracture.  ROM of RUE and bilateral lower extremities intact. Skin: Large right facial hematoma/bruising Neurologic: Very hard of hearing, strength/ROM diminished at left shoulder due to humeral neck fracture.  Moving right upper and bilateral lower extremities equally.  Sensation appears intact. Psychiatric: Limited due to hearing impairment, awake and alert  and able to provide limited history.   Labs on Admission: I have personally reviewed following labs and imaging studies  CBC: Recent Labs  Lab 04/08/20 1535  WBC 14.3*  HGB 13.2  HCT 38.5  MCV 85.9  PLT 580   Basic Metabolic Panel: Recent Labs  Lab 04/08/20 1535  NA 134*  K 4.5  CL 100  CO2 24  GLUCOSE 141*  BUN 21  CREATININE 1.22*  CALCIUM 8.9   GFR: CrCl cannot be calculated (Unknown ideal weight.). Liver Function Tests: No results for input(s): AST, ALT, ALKPHOS, BILITOT, PROT, ALBUMIN in the last 168 hours. No results for input(s): LIPASE, AMYLASE in the last 168 hours. No results for input(s): AMMONIA in the last 168 hours. Coagulation Profile: No results for input(s): INR, PROTIME in the last 168 hours. Cardiac Enzymes: No results for input(s): CKTOTAL, CKMB, CKMBINDEX, TROPONINI in the last 168 hours. BNP (last 3 results) No results for input(s): PROBNP in the last 8760 hours. HbA1C: No results for input(s): HGBA1C in the last 72 hours. CBG: No results for input(s): GLUCAP in the last 168 hours. Lipid Profile: No results for input(s): CHOL, HDL, LDLCALC, TRIG, CHOLHDL, LDLDIRECT in the last 72 hours. Thyroid Function Tests: No results for input(s): TSH, T4TOTAL, FREET4, T3FREE, THYROIDAB in the last 72 hours. Anemia Panel: No results for input(s): VITAMINB12, FOLATE, FERRITIN, TIBC, IRON, RETICCTPCT in the last 72 hours. Urine analysis:    Component Value Date/Time   COLORURINE YELLOW (A) 04/08/2020 1644   APPEARANCEUR HAZY (A) 04/08/2020 1644   APPEARANCEUR Clear 01/05/2020 0939   LABSPEC 1.011 04/08/2020 1644   PHURINE 7.0 04/08/2020 1644   GLUCOSEU NEGATIVE 04/08/2020 1644   HGBUR SMALL (A) 04/08/2020 [place] 04/08/2020 1644   BILIRUBINUR Negative 01/05/2020 0939   KETONESUR 5 (A) 04/08/2020 1644   PROTEINUR NEGATIVE 04/08/2020 1644   NITRITE NEGATIVE 04/08/2020 1644   LEUKOCYTESUR NEGATIVE 04/08/2020 1644     Radiological Exams on Admission: CT HEAD WO CONTRAST  Result Date: 04/08/2020 CLINICAL DATA:  Trauma, followed EXAM: CT HEAD WITHOUT CONTRAST TECHNIQUE: Contiguous axial images were obtained from the base of the skull through the vertex without intravenous contrast. COMPARISON:  11/16/2018 FINDINGS: Brain: There is no acute intracranial hemorrhage, mass effect, or edema. Gray-white differentiation is preserved. There is no extra-axial fluid collection. Confluent areas of hypoattenuation in the supratentorial white matter are nonspecific but probably reflect advanced chronic microvascular ischemic changes similar to the prior study. Prominence of the ventricles and sulci reflects similar parenchymal volume loss. Vascular: There is atherosclerotic calcification at the skull base. Skull: Calvarium is unremarkable. Sinuses/Orbits:  Refer to maxillofacial CT. Other: Right frontal scalp and periorbital hematoma. IMPRESSION: No evidence of acute intracranial injury. Electronically Signed   By: Macy Mis M.D.   On: 04/08/2020 15:31   CT Cervical Spine Wo Contrast  Result Date: 04/08/2020 CLINICAL DATA:  Found down, facial trauma EXAM: CT CERVICAL SPINE WITHOUT CONTRAST TECHNIQUE: Multidetector CT imaging of the cervical spine was performed without intravenous contrast. Multiplanar CT image reconstructions were also generated. COMPARISON:  None. FINDINGS: Alignment: Retrolisthesis at C5-C6 and C6-C7. Skull base and vertebrae: Degenerative plate irregularity at C5, C6, C7. Vertebral body heights are otherwise maintained. There is no acute fracture identified. Soft tissues and spinal canal: No prevertebral fluid or swelling. No visible canal hematoma. Disc levels: Multilevel degenerative changes are present including disc space narrowing, endplate osteophytes, and facet and uncovertebral hypertrophy. These changes are greatest at C5-C6 and C6-C7. Upper chest: Emphysema. IMPRESSION: No acute cervical spine  fracture. Electronically Signed   By: Macy Mis M.D.   On: 04/08/2020 15:38   DG Chest Portable 1 View  Result Date: 04/08/2020 CLINICAL DATA:  Fall with swelling EXAM: PORTABLE CHEST 1 VIEW COMPARISON:  11/16/2018 FINDINGS: Small left-sided pleural effusion. No consolidation. Normal heart size. Aortic atherosclerosis. No pneumothorax. Acute comminuted fracture involving left humerus neck and greater tuberosity. IMPRESSION: Small left-sided pleural effusion. Acute comminuted fracture involving the left humerus neck and greater tuberosity. Electronically Signed   By: Donavan Foil M.D.   On: 04/08/2020 15:52   DG Shoulder Left  Result Date: 04/08/2020 CLINICAL DATA:  Pain status post fall EXAM: LEFT SHOULDER - 2+ VIEW COMPARISON:  None. FINDINGS: There is an acute impacted and mildly displaced fracture through the surgical neck of the proximal left humerus. There is a displaced fracture fragment arising from the greater tuberosity measuring approximately 2.7 cm. There are additional smaller adjacent fracture fragments. There is no dislocation. Osteopenia is noted. IMPRESSION: Neer 3 part acute fracture of the proximal left humerus without evidence for dislocation. Electronically Signed   By: Constance Holster M.D.   On: 04/08/2020 15:52   CT Maxillofacial Wo Contrast  Result Date: 04/08/2020 CLINICAL DATA:  Facial trauma EXAM: CT MAXILLOFACIAL WITHOUT CONTRAST TECHNIQUE: Multidetector CT imaging of the maxillofacial structures was performed. Multiplanar CT image reconstructions were also generated. COMPARISON:  None. FINDINGS: Osseous: No acute facial fracture. Degenerative changes of the right temporomandibular joint. Orbits: No intraorbital hematoma. Sinuses: No significant opacification. Soft tissues: Right frontal scalp, periorbital, and facial soft tissue swelling and hematoma. Limited intracranial: Dictated separately. IMPRESSION: No acute facial fracture. Electronically Signed   By: Macy Mis M.D.   On: 04/08/2020 15:33    EKG: Independently reviewed. Sinus versus ectopic atrial tachycardia.  Prior EKG showed atrial fibrillation.  Assessment/Plan Principal Problem:   Fall Active Problems:   COPD (chronic obstructive pulmonary disease) (HCC)   PAF (paroxysmal atrial fibrillation) (HCC)   Hypothyroidism   Essential hypertension   Anxiety   Fx humeral neck, left, closed, initial encounter   Hematoma of face, initial encounter  [name] is a 86 y.o. female with medical history significant for paroxysmal atrial fibrillation on aspirin alone, pSVT, hypertension, hypothyroidism, COPD on 2L O2 via [place] at night and with activity, CKD stage III, colon cancer s/p right colectomy, depression/anxiety, and very hard of hearing who is admitted after a fall.  Fall/? syncope resulting in left proximal humeral neck fracture and right facial hematoma: History per patient suggestive of a mechanical fall cardiogenic etiology also possible given  elevated troponin.  She has had several falls recently per family.  She has a large right facial hematoma without CT evidence of facial fracture or intraorbital hematoma. -PT/OT eval -Fall precautions -Place left shoulder sling, apply ice as needed -Continue Norco as needed with hold parameters -May need ophthalmology evaluation if periorbital swelling not improving -Monitor on telemetry -Check orthostatic vital signs -Start gentle IV fluid hydration overnight  Elevated troponin: High-sensitivity troponin I 924.  Patient denies any chest pain and EKG is without acute ischemic changes.  We will continue to trend cardiac enzymes and obtain echocardiogram.  Hold anticoagulation given large right facial hematoma.  Paroxysmal atrial fibrillation/pSVT: Tachycardic on admission but appears in sinus rhythm.  Not on anticoagulation due to limited previous A. fib documentation and frequent falls. -Continue Lopressor 50 mg twice  daily  COPD: Uses 2 L O2 at night and with activity.  Currently stable. -Continue Dulera, as needed albuterol, and supplemental oxygen  Hypertension: Continue amlodipine and Lopressor.  Hypothyroidism: Continue Synthroid.  CKD stage III: Chronic and stable.  Continue to monitor.  Anxiety: Continue Atarax as needed.  DVT prophylaxis: SCDs Code Status: DNR, confirmed with patient's daughter Family Communication: Discussed with patient's daughter at bedside Disposition Plan: From home, anticipate discharge to SNF Consults called: None Admission status:  Status is: Observation  The patient remains OBS appropriate and will d/c before 2 midnights.  Dispo: The patient is from: Home              Anticipated d/c is to: SNF              Anticipated d/c date is: 1 day              Patient currently is not medically stable to d/c.   Zada Finders MD [facility]  If 7PM-7AM, please contact night-coverage [email or web address]  04/08/2020, 6:32 PM

## 2020-04-08 NOTE — ED Notes (Signed)
This RN notified V. Patel of critical trop 924 via secure chat.

## 2020-04-08 NOTE — ED Notes (Signed)
Pt urinated. Peri care completed.

## 2020-04-08 NOTE — ED Notes (Signed)
Pt declined placement of pillow behind her head. Bed locked low. Rails up. Call bell within reach. Pt remains on O2 via [place]. Remains ST on cardiac monitor.

## 2020-04-08 NOTE — ED Notes (Signed)
Attending provider Posey Pronto to bedside. Pt's daughter remains at bedside. Pt reports her pain is currently 2/10.

## 2020-04-08 NOTE — ED Notes (Addendum)
Britney NT and Caitlyn NT placing immobilizer now. NT's also cleansing pt's face, arms, and hands further. This RN cleansed R side of pt's face twice earlier but kept oozing blood until EDP Malinda was able to suture lac. R side of pt's face extremely swollen.

## 2020-04-08 NOTE — ED Triage Notes (Signed)
Pt arrives via [facility] from home. Per EMS pt was found by a bystander and she was found lying by the dumpsters. PT is a poor historian and states "Im cold". PT received 112mcg fentanyl in route as well as 500 cc NS bolus. Pt unable to tell me how long she was down for or what happened prior. Pt with large hematoma to right side of face as well as bleeding that is controlled. Pt also has swelling of the left shoulder. Pt is A&Ox4.

## 2020-04-09 ENCOUNTER — Observation Stay: Payer: Medicare Other

## 2020-04-09 ENCOUNTER — Observation Stay: Admit: 2020-04-09 | Payer: Medicare Other

## 2020-04-09 DIAGNOSIS — E039 Hypothyroidism, unspecified: Secondary | ICD-10-CM

## 2020-04-09 DIAGNOSIS — N189 Chronic kidney disease, unspecified: Secondary | ICD-10-CM

## 2020-04-09 DIAGNOSIS — N179 Acute kidney failure, unspecified: Secondary | ICD-10-CM

## 2020-04-09 DIAGNOSIS — Y92009 Unspecified place in unspecified non-institutional (private) residence as the place of occurrence of the external cause: Secondary | ICD-10-CM

## 2020-04-09 DIAGNOSIS — M4696 Unspecified inflammatory spondylopathy, lumbar region: Secondary | ICD-10-CM | POA: Diagnosis not present

## 2020-04-09 DIAGNOSIS — S3992XA Unspecified injury of lower back, initial encounter: Secondary | ICD-10-CM | POA: Diagnosis not present

## 2020-04-09 DIAGNOSIS — I480 Paroxysmal atrial fibrillation: Secondary | ICD-10-CM

## 2020-04-09 DIAGNOSIS — M546 Pain in thoracic spine: Secondary | ICD-10-CM | POA: Diagnosis not present

## 2020-04-09 DIAGNOSIS — W19XXXA Unspecified fall, initial encounter: Secondary | ICD-10-CM

## 2020-04-09 DIAGNOSIS — Z66 Do not resuscitate: Secondary | ICD-10-CM

## 2020-04-09 DIAGNOSIS — D72825 Bandemia: Secondary | ICD-10-CM

## 2020-04-09 DIAGNOSIS — Z789 Other specified health status: Secondary | ICD-10-CM

## 2020-04-09 DIAGNOSIS — R5381 Other malaise: Secondary | ICD-10-CM | POA: Diagnosis not present

## 2020-04-09 DIAGNOSIS — M5137 Other intervertebral disc degeneration, lumbosacral region: Secondary | ICD-10-CM | POA: Diagnosis not present

## 2020-04-09 DIAGNOSIS — S0083XA Contusion of other part of head, initial encounter: Secondary | ICD-10-CM

## 2020-04-09 DIAGNOSIS — M5136 Other intervertebral disc degeneration, lumbar region: Secondary | ICD-10-CM | POA: Diagnosis not present

## 2020-04-09 DIAGNOSIS — S42292A Other displaced fracture of upper end of left humerus, initial encounter for closed fracture: Secondary | ICD-10-CM

## 2020-04-09 LAB — CBC WITH DIFFERENTIAL/PLATELET
Abs Immature Granulocytes: 0.11 10*3/uL — ABNORMAL HIGH (ref 0.00–0.07)
Basophils Absolute: 0 10*3/uL (ref 0.0–0.1)
Basophils Relative: 0 %
Eosinophils Absolute: 0 10*3/uL (ref 0.0–0.5)
Eosinophils Relative: 0 %
HCT: 35.3 % — ABNORMAL LOW (ref 36.0–46.0)
Hemoglobin: 11.9 g/dL — ABNORMAL LOW (ref 12.0–15.0)
Immature Granulocytes: 1 %
Lymphocytes Relative: 7 %
Lymphs Abs: 1.5 10*3/uL (ref 0.7–4.0)
MCH: 29.8 pg (ref 26.0–34.0)
MCHC: 33.7 g/dL (ref 30.0–36.0)
MCV: 88.3 fL (ref 80.0–100.0)
Monocytes Absolute: 1.1 10*3/uL — ABNORMAL HIGH (ref 0.1–1.0)
Monocytes Relative: 5 %
Neutro Abs: 18.8 10*3/uL — ABNORMAL HIGH (ref 1.7–7.7)
Neutrophils Relative %: 87 %
Platelets: 152 10*3/uL (ref 150–400)
RBC: 4 MIL/uL (ref 3.87–5.11)
RDW: 13.5 % (ref 11.5–15.5)
WBC: 21.5 10*3/uL — ABNORMAL HIGH (ref 4.0–10.5)
nRBC: 0 % (ref 0.0–0.2)

## 2020-04-09 LAB — BASIC METABOLIC PANEL
Anion gap: 10 (ref 5–15)
BUN: 20 mg/dL (ref 8–23)
CO2: 24 mmol/L (ref 22–32)
Calcium: 8.7 mg/dL — ABNORMAL LOW (ref 8.9–10.3)
Chloride: 100 mmol/L (ref 98–111)
Creatinine, Ser: 1.14 mg/dL — ABNORMAL HIGH (ref 0.44–1.00)
GFR calc Af Amer: 49 mL/min — ABNORMAL LOW (ref >60)
GFR calc non Af Amer: 43 mL/min — ABNORMAL LOW (ref >60)
Glucose, Bld: 153 mg/dL — ABNORMAL HIGH (ref 70–99)
Potassium: 4.4 mmol/L (ref 3.5–5.1)
Sodium: 134 mmol/L — ABNORMAL LOW (ref 135–145)

## 2020-04-09 LAB — TROPONIN I (HIGH SENSITIVITY)
Troponin I (High Sensitivity): 946 ng/L (ref <18)
Troponin I (High Sensitivity): 914 ng/L (ref <18)

## 2020-04-09 LAB — CBC
HCT: 35.7 % — ABNORMAL LOW (ref 36.0–46.0)
Hemoglobin: 12.4 g/dL (ref 12.0–15.0)
MCH: 29.7 pg (ref 26.0–34.0)
MCHC: 34.7 g/dL (ref 30.0–36.0)
MCV: 85.4 fL (ref 80.0–100.0)
Platelets: 151 10*3/uL (ref 150–400)
RBC: 4.18 MIL/uL (ref 3.87–5.11)
RDW: 13.5 % (ref 11.5–15.5)
WBC: 22.2 10*3/uL — ABNORMAL HIGH (ref 4.0–10.5)
nRBC: 0 % (ref 0.0–0.2)

## 2020-04-09 LAB — GLUCOSE, CAPILLARY: Glucose-Capillary: 154 mg/dL — ABNORMAL HIGH (ref 70–99)

## 2020-04-09 NOTE — Plan of Care (Signed)
Patient is a new admission, arrive to floor in stable condition. A/Ox4 can verbally respond with clear speech, continent of B?B, and  resting in bed at this time. Patient has a Sling left shoulder and arm due to Neer 3 part fracture of proximal left humerus without evidence of dislocation from fall on ramp at apartment complex. Patient has a hx of falls 2nd with 2 wks. Patent has bruise, 3 stitches, and ecchymosis to right eye. Generalized bruises scatter all over RUE/LUE/RLE/LLE, abrasion on right elbow, and right shin. Patient tolerated scheduled medication PO well, no adverse reactions, side effects noted, or swallow issues noted.  Denies acute pain and discomfort, expressed pain and discomfort with movement only. No abnormal behaviors and will continue to monitor.

## 2020-04-09 NOTE — Evaluation (Signed)
Physical Therapy Evaluation Patient Details Name: [name] MRN: [id number] DOB: 1928/01/08 Today's Date: 04/09/2020   History of Present Illness  [name] is a 86 y.o. female with medical history significant for paroxysmal atrial fibrillation on aspirin alone, pSVT, hypertension, hypothyroidism, COPD on 2L O2 via [place] at night and with activity, CKD stage III, colon cancer s/p right colectomy, depression/anxiety, and very hard of hearing. S/P Neer 3 part acute fracture of the proximal left humerus withoutevidence for dislocation. Right frontal scalp, periorbital, and facial softtissue swelling and hematoma.  Clinical Impression  Pt was seen after being up to be assessed by OT, but now complaining of spinal pain with movement of LLE.  Pt was carefully assisted to sit, unable to get her up due to pain and her new symptoms.  Talked with MD and will have some follow up imaging to see if she has further injury than LUE.  Pt is tired and in enough pain from this mobility that PT is recommending SNF to follow for further balance and strength training, as pt had sustained other falls prior to this injury.  Follow acutely for the same needs, manage pain with nursing and will continue to use back precautions due to pain as we await further testing.    Follow Up Recommendations SNF    Equipment Recommendations  None recommended by PT    Recommendations for Other Services       Precautions / Restrictions Precautions Precautions: Fall;Back Precaution Comments: Patient currently s/p Neer 3 part acute fracture of the proximal left humerus withoutevidence for dislocation, arm immobilized in sling Required Braces or Orthoses: Sling Restrictions Weight Bearing Restrictions: Yes LUE Weight Bearing: Non weight bearing      Mobility  Bed Mobility Overal bed mobility: Needs Assistance Bed Mobility: Rolling;Sidelying to Sit;Sit to Sidelying Rolling: Mod assist Sidelying to sit: Max  assist     Sit to sidelying: Max assist General bed mobility comments: mod to roll and max to sit up and get back to bed with spinal pain  Transfers Overall transfer level: Needs assistance               General transfer comment: pt declined to attempt due to pain in arm and spine  Ambulation/Gait             General Gait Details: deferred  Stairs            Wheelchair Mobility    Modified Rankin (Stroke Patients Only)       Balance Overall balance assessment: Needs assistance;History of Falls Sitting-balance support: Feet supported;Single extremity supported Sitting balance-Leahy Scale: Poor Sitting balance - Comments: requires trunk support to maintain balance                                     Pertinent Vitals/Pain Pain Assessment: Faces Pain Score: 10-Worst pain ever Faces Pain Scale: Hurts whole lot Pain Location: left shoulder, spine Pain Descriptors / Indicators: Guarding;Grimacing;Tightness Pain Intervention(s): Limited activity within patient's tolerance;Monitored during session;Repositioned    Home Living Family/patient expects to be discharged to:: Other (Comment)                 Additional Comments: Patient lives in a independent senior apartment complex in downtown [place]    Prior Function Level of Independence: Independent         Comments: fully independent at home in accessible apts but  has sustained mult falls recently     Hand Dominance   Dominant Hand: Right    Extremity/Trunk Assessment   Upper Extremity Assessment Upper Extremity Assessment: Defer to OT evaluation RUE Deficits / Details: RUE with ROM WFLs, has an abrasion on her right arm, bruising LUE Deficits / Details: patient with LUE immobilized and in a sling, with increased pain at rest    Lower Extremity Assessment Lower Extremity Assessment: Generalized weakness;LLE deficits/detail LLE Deficits / Details: movement of LLE causes  spinal pain    Cervical / Trunk Assessment Cervical / Trunk Assessment: Kyphotic;Other exceptions (pain in spine to move to sit up which pt did reluctantly) Cervical / Trunk Exceptions: noted anterolisthesis on C-spine  Communication   Communication: HOH;Other (comment) (R ear hearing aid, delayed responses to questions)  Cognition Arousal/Alertness: Awake/alert Behavior During Therapy: WFL for tasks assessed/performed Overall Cognitive Status: Within Functional Limits for tasks assessed                                 General Comments: pt had delayed responses to questions, agitated about spinal pain with any movement of her LLE      General Comments General comments (skin integrity, edema, etc.): pt is having spinal pain which is referred to MD, will have follow up imaging to ensure a fracture has not occurred    Exercises     Assessment/Plan    PT Assessment Patient needs continued PT services  PT Problem List Decreased strength;Decreased range of motion;Decreased activity tolerance;Decreased balance;Decreased mobility;Decreased coordination;Cardiopulmonary status limiting activity;Pain;Decreased skin integrity       PT Treatment Interventions DME instruction;Gait training;Functional mobility training;Therapeutic activities;Therapeutic exercise;Balance training;Neuromuscular re-education;Patient/family education    PT Goals (Current goals can be found in the Care Plan section)  Acute Rehab PT Goals Patient Stated Goal: pt is concerned about getting back pain managed PT Goal Formulation: With patient Time For Goal Achievement: 04/23/20 Potential to Achieve Goals: Good    Frequency Min 3X/week   Barriers to discharge Decreased caregiver support has accessible apt but home alone    Co-evaluation               AM-PAC PT "6 Clicks" Mobility  Outcome Measure Help needed turning from your back to your side while in a flat bed without using bedrails?: A  Lot Help needed moving from lying on your back to sitting on the side of a flat bed without using bedrails?: A Lot Help needed moving to and from a bed to a chair (including a wheelchair)?: A Lot Help needed standing up from a chair using your arms (e.g., wheelchair or bedside chair)?: Total Help needed to walk in hospital room?: Total Help needed climbing 3-5 steps with a railing? : Total 6 Click Score: 9    End of Session Equipment Utilized During Treatment: Oxygen Activity Tolerance: Patient limited by pain Patient left: in bed;with call bell/phone within reach;with bed alarm set Nurse Communication: Mobility status PT Visit Diagnosis: History of falling (Z91.81);Muscle weakness (generalized) (M62.81);Pain Pain - Right/Left: Left Pain - part of body: Arm (and spine)    Time: 6834-1962 PT Time Calculation (min) (ACUTE ONLY): 14 min   Charges:   PT Evaluation $PT Eval Moderate Complexity: 1 Mod         Ramond Dial 04/09/2020, 12:48 PM  Mee Hives, PT MS Acute Rehab Dept. Number: [place] [facility] [place]

## 2020-04-09 NOTE — Progress Notes (Signed)
PROGRESS NOTE  Carol SNOOKS HAL:[id number] DOB: 11-23-1927   PCP: Valerie Roys, DO  Patient is from: Home (senior living apartment complex)  DOA: 04/08/2020 LOS: 0  Brief Narrative / Interim history: 86 year old female with PMH of COPD on 2 L, A. fib/PSVT, HTN, hypothyroidism, CKD-3B, colon cancer status post right colectomy, depression, anxiety and hearing impairment brought to ED by EMS after she slipped and fell while walking down a ramp.  She suffered an injury to her right face with large right-sided hematoma, and acute impacted and mildly displaced fracture through the surgical neck of the proximal left humerus and displaced fracture fragment arising from the greater tuberosity.  CT head and cervical spine without acute finding.  CT maxillofacial negative for acute facial fracture or intraorbital hematoma.  Hemodynamically stable except for mild tachycardia.  Basic labs and UA without significant finding.  High-sensitivity troponin elevated to 924.  EKG without acute ischemic finding.  Patient was admitted for further evaluation and management of possible syncopal event.   Subjective: Seen and examined earlier this morning.  No major events overnight of this morning.  She was sleepy but awakes to voice.  Has difficulty hearing.  She responds no to pain including chest pain, shortness of breath, abdominal pain or headache.   Objective: Vitals:   04/09/20 0348 04/09/20 0500 04/09/20 0732 04/09/20 1152  BP: (!) 153/71  (!) 148/74 140/65  Pulse: (!) 56  (!) 108 76  Resp: 16  19 17   Temp: 98.2 F (36.8 C)  98.9 F (37.2 C) 97.9 F (36.6 C)  TempSrc: Oral  Oral Oral  SpO2: 90%  94% 97%  Weight:  44.7 kg    Height:        Intake/Output Summary (Last 24 hours) at 04/09/2020 1558 Last data filed at 04/09/2020 0600 Gross per 24 hour  Intake 436.04 ml  Output 150 ml  Net 286.04 ml   Filed Weights   04/08/20 1500 04/08/20 2240 04/09/20 0500  Weight: 45.4 kg 42.8 kg 44.7 kg     Examination:  GENERAL: Frail looking elderly female.  Nontoxic. HEENT: Dry mucous membrane.  Right facial bruising and ecchymosis.  Bulbar conjunctiva/subconjunctival hemorrhage in the right.  PERRL.  EOMI.  Vision grossly intact.  Impaired hearing. NECK: Supple.  Full range of motion in her neck. RESP:  No IWOB.  Fair aeration bilaterally. CVS:  RRR. Heart sounds normal.  ABD/GI/GU: BS+. Abd soft, NTND.  MSK/EXT:  Moves extremities. No apparent deformity. No edema.  SKIN: Bruising and ecchymosis over right face and left upper arm medially. NEURO: Awake and oriented to self and place.  No apparent focal neuro deficit but limited exam in LUE due to pain PSYCH: Calm. Normal affect.  Procedures:  None  Microbiology summarized: COVID-19 PCR negative.  Assessment & Plan: Mechanical fall at home-reportedly slipped and fell while walking down the ramp Right facial hematoma Left proximal humeral neck fracture -CT head/cervical spine/maxillofacial without significant finding other than right facial hematoma -Discussed with Ortho, Dr. Harlow Mares about the humeral fracture. He recommended sling and supportive care with outpatient follow-up in 2 weeks -Continue Norco as needed with hold parameters -Continue IV fluid.  Looks dehydrated with dry oral mucosa  Elevated troponin: High-sensitivity troponin 924> 1176>> 914.  Most likely demand ischemia.  No acute ischemic finding on EKG.  She denies chest pain. -Discussed with cardiology, Dr. Clayborn Bigness.  He does not think this is ACS or cardiac event.  -Follow echocardiogram  Paroxysmal atrial fibrillation/pSVT: On  metoprolol for rate control.  Not on anticoagulation likely due to frequent fall.  Seems to have ectopic rhythm on EKG. -Continue home metoprolol  Chronic COPD/respiratory failure: On 2 L at home.  Stable. -Continue Dulera, as needed albuterol, and supplemental oxygen  Essential hypertension: BP within fair range. -Continue home  amlodipine and Lopressor.  Hypothyroidism: -Continue Synthroid.  CKD stage IIIB: Stable. -Monitor intermittently  Mild hyponatremia: -Continue monitoring  Anxiety: Stable. -Continue low-dose Atarax as needed.   Leukocytosis: Likely demargination.  No other signs of infection.  No cardiopulmonary symptoms.  UA without signs of UTI. -Continue monitoring  Debility/physical deconditioning -PT/OT  DNR/DNI-appropriate.   Body mass index is 19.25 kg/m.         DVT prophylaxis:  SCDs Start: 04/08/20 1931  Code Status: DNR/DNI Family Communication: Attempted to call patient's daughter, [name] twice using both phone number she left at bedside but didn't go through.  Status is: Observation   Dispo: The patient is from: Home              Anticipated d/c is to: SNF              Anticipated d/c date is: 1 day              Patient currently is medically stable to d/c.        Consultants:  Orthopedic surgery-Dr. Harlow Mares over the phone Cardiology, Dr. Clayborn Bigness over the phone   Sch Meds:  Scheduled Meds: . amLODipine  2.5 mg Oral Daily  . feeding supplement (ENSURE ENLIVE)  237 mL Oral BID BM  . levothyroxine  100 mcg Oral Daily  . metoprolol tartrate  50 mg Oral BID  . mometasone-formoterol  2 puff Inhalation BID  . sodium chloride flush  3 mL Intravenous Once  . sodium chloride flush  3 mL Intravenous Q12H   Continuous Infusions: PRN Meds:.acetaminophen **OR** acetaminophen, albuterol, HYDROcodone-acetaminophen, hydrOXYzine, ondansetron **OR** ondansetron (ZOFRAN) IV  Antimicrobials: Anti-infectives (From admission, onward)   None       I have personally reviewed the following labs and images: CBC: Recent Labs  Lab 04/08/20 1535 04/09/20 0522  WBC 14.3* 22.2*  HGB 13.2 12.4  HCT 38.5 35.7*  MCV 85.9 85.4  PLT 183 151   BMP &GFR Recent Labs  Lab 04/08/20 1535 04/09/20 0522  NA 134* 134*  K 4.5 4.4  CL 100 100  CO2 24 24  GLUCOSE 141* 153*   BUN 21 20  CREATININE 1.22* 1.14*  CALCIUM 8.9 8.7*   Estimated Creatinine Clearance: 23.6 mL/min (A) (by C-G formula based on SCr of 1.14 mg/dL (H)). Liver & Pancreas: No results for input(s): AST, ALT, ALKPHOS, BILITOT, PROT, ALBUMIN in the last 168 hours. No results for input(s): LIPASE, AMYLASE in the last 168 hours. No results for input(s): AMMONIA in the last 168 hours. Diabetic: No results for input(s): HGBA1C in the last 72 hours. Recent Labs  Lab 04/09/20 0518  GLUCAP 154*   Cardiac Enzymes: No results for input(s): CKTOTAL, CKMB, CKMBINDEX, TROPONINI in the last 168 hours. No results for input(s): PROBNP in the last 8760 hours. Coagulation Profile: No results for input(s): INR, PROTIME in the last 168 hours. Thyroid Function Tests: No results for input(s): TSH, T4TOTAL, FREET4, T3FREE, THYROIDAB in the last 72 hours. Lipid Profile: No results for input(s): CHOL, HDL, LDLCALC, TRIG, CHOLHDL, LDLDIRECT in the last 72 hours. Anemia Panel: No results for input(s): VITAMINB12, FOLATE, FERRITIN, TIBC, IRON, RETICCTPCT in the last 72 hours.  Urine analysis:    Component Value Date/Time   COLORURINE YELLOW (A) 04/08/2020 1644   APPEARANCEUR HAZY (A) 04/08/2020 1644   APPEARANCEUR Clear 01/05/2020 0939   LABSPEC 1.011 04/08/2020 1644   PHURINE 7.0 04/08/2020 1644   GLUCOSEU NEGATIVE 04/08/2020 1644   HGBUR SMALL (A) 04/08/2020 1644   BILIRUBINUR NEGATIVE 04/08/2020 1644   BILIRUBINUR Negative 01/05/2020 0939   KETONESUR 5 (A) 04/08/2020 1644   PROTEINUR NEGATIVE 04/08/2020 1644   NITRITE NEGATIVE 04/08/2020 [place] 04/08/2020 1644   Sepsis Labs: Invalid input(s): PROCALCITONIN, [place]  Microbiology: Recent Results (from the past 240 hour(s))  SARS Coronavirus 2 by RT PCR (hospital order, performed in [facility] hospital lab) Nasopharyngeal Nasopharyngeal Swab     Status: None   Collection Time: 04/08/20  5:42 PM   Specimen:  Nasopharyngeal Swab  Result Value Ref Range Status   SARS Coronavirus 2 NEGATIVE NEGATIVE Final    Comment: (NOTE) SARS-CoV-2 target nucleic acids are NOT DETECTED.  The SARS-CoV-2 RNA is generally detectable in upper and lower respiratory specimens during the acute phase of infection. The lowest concentration of SARS-CoV-2 viral copies this assay can detect is 250 copies / mL. A negative result does not preclude SARS-CoV-2 infection and should not be used as the sole basis for treatment or other patient management decisions.  A negative result may occur with improper specimen collection / handling, submission of specimen other than nasopharyngeal swab, presence of viral mutation(s) within the areas targeted by this assay, and inadequate number of viral copies (<250 copies / mL). A negative result must be combined with clinical observations, patient history, and epidemiological information.  Fact Sheet for Patients:   StrictlyIdeas.no  Fact Sheet for Healthcare Providers: BankingDealers.co.za  This test is not yet approved or  cleared by the [place] FDA and has been authorized for detection and/or diagnosis of SARS-CoV-2 by FDA under an Emergency Use Authorization (EUA).  This EUA will remain in effect (meaning this test can be used) for the duration of the COVID-19 declaration under Section 564(b)(1) of the Act, 21 U.S.C. section 360bbb-3(b)(1), unless the authorization is terminated or revoked sooner.  Performed at [facility], [address] [place], [place] [address]     Radiology Studies: DG Thoracic Spine 2 View  Result Date: 04/09/2020 CLINICAL DATA:  Fall.  Back pain EXAM: THORACIC SPINE 2 VIEWS COMPARISON:  None. FINDINGS: Sigmoid scoliosis of the thoracolumbar spine. Loss of vertebral body height. No subluxation. Normal paraspinal line. IMPRESSION: No acute findings of the thoracic spine. Electronically  Signed   By: Suzy Bouchard M.D.   On: 04/09/2020 13:53   DG Lumbar Spine 2-3 Views  Result Date: 04/09/2020 CLINICAL DATA:  Inpatient.  Fall yesterday.  Back pain. EXAM: LUMBAR SPINE - 2-3 VIEW COMPARISON:  06/22/2018 CT angiogram of the abdomen and pelvis FINDINGS: This report assumes 5 non rib-bearing lumbar vertebrae. Moderate rotatory levoscoliosis of the lumbar spine. Lumbar vertebral body heights are preserved, with no fracture. Moderate multilevel degenerative disc disease throughout the lumbar spine, most prominent at L5-S1. Stable minimal 2 mm retrolisthesis at L1-2 and L2-3. Moderate to marked bilateral lumbar facet arthropathy. No aggressive appearing focal osseous lesions. Abdominal aortic atherosclerosis. IMPRESSION: 1. No lumbar spine fracture or acute malalignment. 2. Moderate rotatory levoscoliosis of the lumbar spine. 3. Moderate multilevel lumbar degenerative disc disease, most prominent at L5-S1. 4. Moderate to marked bilateral lumbar facet arthropathy. Electronically Signed   By: Janina Mayo.D.  On: 04/09/2020 13:55      Evanthia Maund T. [place]  If 7PM-7AM, please contact night-coverage [email or web address] Password [facility] 04/09/2020, 3:58 PM

## 2020-04-09 NOTE — Evaluation (Signed)
Occupational Therapy Evaluation Patient Details Name: [name] MRN: [id number] DOB: [birth year] Today's Date: 04/09/2020    History of Present Illness [name] is a 86 y.o. female with medical history significant for paroxysmal atrial fibrillation on aspirin alone, pSVT, hypertension, hypothyroidism, COPD on 2L O2 via [place] at night and with activity, CKD stage III, colon cancer s/p right colectomy, depression/anxiety, and very hard of hearing. S/P Neer 3 part acute fracture of the proximal left humerus withoutevidence for dislocation. Right frontal scalp, periorbital, and facial softtissue swelling and hematoma.   Clinical Impression   Patient is a 86 yo female who was admitted after a fall and now s/p neer 3 acute fracture of the proximal left humerus without evidence for dislocation, she also has periorbital and facial swelling and a hematoma.  She lives alone in a senior independent living apartment community and was independent with all ADLs and IADLs and driving.  She has had 2 recent falls in the last 2 weeks.  Patient with increased pain this date with any movements.  Patient presents with muscle weakness, decreased ROM, decreased ability to perform functional transfers/ambulation and decreased ability to perform self care tasks.  Patient now requires max to total assist with all self care and IADL tasks and will require short term rehab at discharge.  Patient will benefit from OT services to maximize her safety and independence in necessary daily tasks.      Follow Up Recommendations  SNF    Equipment Recommendations       Recommendations for Other Services       Precautions / Restrictions Precautions Precaution Comments: Patient currently s/p Neer 3 part acute fracture of the proximal left humerus withoutevidence for dislocation, arm immobilized in sling Required Braces or Orthoses: Sling      Mobility Bed Mobility Overal bed mobility: Needs Assistance              General bed mobility comments: Max assist with bed mobility  Transfers Overall transfer level: Needs assistance               General transfer comment: +2 for transfers    Balance   TX:  Patient requires max to total assist for changing gown and dependent for donning sling.  Sling was not in proper position on arrival.  Patient complains of increased pain in left arm and tends to want to cradle it with her right arm.                                             ADL either performed or assessed with clinical judgement   ADL Overall ADL's : Needs assistance/impaired Eating/Feeding: Set up;Minimal assistance   Grooming: Set up;Moderate assistance   Upper Body Bathing: Set up;Maximal assistance   Lower Body Bathing: Set up;Maximal assistance;+2 for physical assistance   Upper Body Dressing : Maximal assistance   Lower Body Dressing: Maximal assistance;+2 for physical assistance   Toilet Transfer: +2 for physical assistance   Toileting- Clothing Manipulation and Hygiene: Total assistance;+2 for physical assistance         General ADL Comments: Patient with extreme pain this date and limited with all self care tasks     Vision         Perception     Praxis      Pertinent Vitals/Pain Pain Assessment: 0-10 Pain Score:  10-Worst pain ever Pain Location: left shoulder Pain Descriptors / Indicators: Sharp;Shooting;Aching Pain Intervention(s): Limited activity within patient's tolerance     Hand Dominance Right   Extremity/Trunk Assessment Upper Extremity Assessment Upper Extremity Assessment: Generalized weakness;LUE deficits/detail;RUE deficits/detail RUE Deficits / Details: RUE with ROM WFLs, has an abrasion on her right arm, bruising LUE Deficits / Details: patient with LUE immobilized and in a sling, with increased pain at rest   Lower Extremity Assessment Lower Extremity Assessment: Defer to PT evaluation        Communication Communication Communication: HOH;Other (comment) (has hearing aid for right ear only)   Cognition Arousal/Alertness: Awake/alert Behavior During Therapy: WFL for tasks assessed/performed Overall Cognitive Status: Within Functional Limits for tasks assessed                                 General Comments: Daughter in during session to assist with history, patient is [facility] but daughter brought in right hearing aid and patient able to answer questions appropriately   General Comments       Exercises     Shoulder Instructions      Home Living Family/patient expects to be discharged to:: Other (Comment)                                 Additional Comments: Patient lives in a independent senior apartment complex in [place]      Prior Functioning/Environment Level of Independence: Independent        Comments: Patient lives independently in senior apartment complex, was independent with all self care, driving, medication management and light homemaking tasks.        OT Problem List: Decreased strength;Decreased knowledge of use of DME or AE;Increased edema;Decreased range of motion;Decreased coordination;Decreased activity tolerance;Impaired UE functional use;Pain      OT Treatment/Interventions: Self-care/ADL training;Therapeutic exercise;Patient/family education;Neuromuscular education;Therapeutic activities;DME and/or AE instruction    OT Goals(Current goals can be found in the care plan section) Acute Rehab OT Goals Patient Stated Goal: Patient would like to be independent and return to her apartment eventually. OT Goal Formulation: With patient/family Time For Goal Achievement: 04/23/20 Potential to Achieve Goals: Fair  OT Frequency: Min 1X/week   Barriers to D/C:            Co-evaluation              AM-PAC OT "6 Clicks" Daily Activity     Outcome Measure Help from another person eating meals?: A Lot Help  from another person taking care of personal grooming?: A Lot Help from another person toileting, which includes using toliet, bedpan, or urinal?: Total Help from another person bathing (including washing, rinsing, drying)?: Total Help from another person to put on and taking off regular upper body clothing?: Total Help from another person to put on and taking off regular lower body clothing?: Total 6 Click Score: 8   End of Session Nurse Communication: Other (comment) (bed alarm keeps going off even without movement, nurse to assess alarm)  Activity Tolerance: Patient limited by pain Patient left: in bed;with call bell/phone within reach;with bed alarm set;with family/visitor present  OT Visit Diagnosis: Unsteadiness on feet (R26.81);Muscle weakness (generalized) (M62.81);Repeated falls (R29.6)                Time: 8242-3536 OT Time Calculation (min): 39 min Charges:  OT General Charges $OT  Visit: 1 Visit OT Evaluation $OT Eval Low Complexity: 1 Low OT Treatments $Self Care/Home Management : 8-22 mins  [name], OTR/L, CLT   [name] 04/09/2020, 11:13 AM

## 2020-04-10 ENCOUNTER — Observation Stay (HOSPITAL_COMMUNITY)
Admit: 2020-04-10 | Discharge: 2020-04-10 | Disposition: A | Discharge status: Home / Self Care | Payer: Medicare Other | Attending: Internal Medicine | Admitting: Internal Medicine

## 2020-04-10 DIAGNOSIS — Z20822 Contact with and (suspected) exposure to covid-19: Secondary | ICD-10-CM | POA: Diagnosis present

## 2020-04-10 DIAGNOSIS — I351 Nonrheumatic aortic (valve) insufficiency: Secondary | ICD-10-CM | POA: Diagnosis not present

## 2020-04-10 DIAGNOSIS — Z681 Body mass index (BMI) 19 or less, adult: Secondary | ICD-10-CM | POA: Diagnosis not present

## 2020-04-10 DIAGNOSIS — S42212A Unspecified displaced fracture of surgical neck of left humerus, initial encounter for closed fracture: Principal | ICD-10-CM

## 2020-04-10 DIAGNOSIS — J9611 Chronic respiratory failure with hypoxia: Secondary | ICD-10-CM | POA: Diagnosis present

## 2020-04-10 DIAGNOSIS — E871 Hypo-osmolality and hyponatremia: Secondary | ICD-10-CM | POA: Diagnosis present

## 2020-04-10 DIAGNOSIS — F329 Major depressive disorder, single episode, unspecified: Secondary | ICD-10-CM | POA: Diagnosis present

## 2020-04-10 DIAGNOSIS — I10 Essential (primary) hypertension: Secondary | ICD-10-CM

## 2020-04-10 DIAGNOSIS — D72829 Elevated white blood cell count, unspecified: Secondary | ICD-10-CM | POA: Diagnosis present

## 2020-04-10 DIAGNOSIS — I129 Hypertensive chronic kidney disease with stage 1 through stage 4 chronic kidney disease, or unspecified chronic kidney disease: Secondary | ICD-10-CM | POA: Diagnosis present

## 2020-04-10 DIAGNOSIS — I361 Nonrheumatic tricuspid (valve) insufficiency: Secondary | ICD-10-CM

## 2020-04-10 DIAGNOSIS — N1831 Chronic kidney disease, stage 3a: Secondary | ICD-10-CM

## 2020-04-10 DIAGNOSIS — F419 Anxiety disorder, unspecified: Secondary | ICD-10-CM | POA: Diagnosis present

## 2020-04-10 DIAGNOSIS — D509 Iron deficiency anemia, unspecified: Secondary | ICD-10-CM | POA: Diagnosis present

## 2020-04-10 DIAGNOSIS — J90 Pleural effusion, not elsewhere classified: Secondary | ICD-10-CM | POA: Diagnosis present

## 2020-04-10 DIAGNOSIS — R000 Tachycardia, unspecified: Secondary | ICD-10-CM

## 2020-04-10 DIAGNOSIS — R55 Syncope and collapse: Secondary | ICD-10-CM | POA: Diagnosis present

## 2020-04-10 DIAGNOSIS — N1832 Chronic kidney disease, stage 3b: Secondary | ICD-10-CM | POA: Diagnosis present

## 2020-04-10 DIAGNOSIS — I480 Paroxysmal atrial fibrillation: Secondary | ICD-10-CM | POA: Diagnosis present

## 2020-04-10 DIAGNOSIS — E039 Hypothyroidism, unspecified: Secondary | ICD-10-CM | POA: Diagnosis present

## 2020-04-10 DIAGNOSIS — J432 Centrilobular emphysema: Secondary | ICD-10-CM | POA: Diagnosis not present

## 2020-04-10 DIAGNOSIS — S0083XA Contusion of other part of head, initial encounter: Secondary | ICD-10-CM | POA: Diagnosis present

## 2020-04-10 DIAGNOSIS — R778 Other specified abnormalities of plasma proteins: Secondary | ICD-10-CM | POA: Diagnosis not present

## 2020-04-10 DIAGNOSIS — Z85038 Personal history of other malignant neoplasm of large intestine: Secondary | ICD-10-CM | POA: Diagnosis not present

## 2020-04-10 DIAGNOSIS — W010XXA Fall on same level from slipping, tripping and stumbling without subsequent striking against object, initial encounter: Secondary | ICD-10-CM | POA: Diagnosis present

## 2020-04-10 DIAGNOSIS — Z9049 Acquired absence of other specified parts of digestive tract: Secondary | ICD-10-CM | POA: Diagnosis not present

## 2020-04-10 DIAGNOSIS — Z515 Encounter for palliative care: Secondary | ICD-10-CM | POA: Diagnosis not present

## 2020-04-10 DIAGNOSIS — Y9301 Activity, walking, marching and hiking: Secondary | ICD-10-CM | POA: Diagnosis present

## 2020-04-10 DIAGNOSIS — S0010XA Contusion of unspecified eyelid and periocular area, initial encounter: Secondary | ICD-10-CM | POA: Diagnosis present

## 2020-04-10 DIAGNOSIS — D696 Thrombocytopenia, unspecified: Secondary | ICD-10-CM | POA: Diagnosis present

## 2020-04-10 DIAGNOSIS — Z7189 Other specified counseling: Secondary | ICD-10-CM | POA: Diagnosis not present

## 2020-04-10 DIAGNOSIS — W19XXXA Unspecified fall, initial encounter: Secondary | ICD-10-CM | POA: Diagnosis not present

## 2020-04-10 DIAGNOSIS — Y92009 Unspecified place in unspecified non-institutional (private) residence as the place of occurrence of the external cause: Secondary | ICD-10-CM | POA: Diagnosis not present

## 2020-04-10 DIAGNOSIS — Z9181 History of falling: Secondary | ICD-10-CM | POA: Diagnosis not present

## 2020-04-10 DIAGNOSIS — Z66 Do not resuscitate: Secondary | ICD-10-CM | POA: Diagnosis present

## 2020-04-10 DIAGNOSIS — J449 Chronic obstructive pulmonary disease, unspecified: Secondary | ICD-10-CM | POA: Diagnosis present

## 2020-04-10 DIAGNOSIS — H9190 Unspecified hearing loss, unspecified ear: Secondary | ICD-10-CM

## 2020-04-10 DIAGNOSIS — Z23 Encounter for immunization: Secondary | ICD-10-CM | POA: Diagnosis not present

## 2020-04-10 LAB — ECHOCARDIOGRAM COMPLETE
Height: 60 in
S' Lateral: 2.03 cm
Weight: 1328 oz

## 2020-04-10 LAB — RENAL FUNCTION PANEL
Albumin: 3.3 g/dL — ABNORMAL LOW (ref 3.5–5.0)
Anion gap: 7 (ref 5–15)
BUN: 22 mg/dL (ref 8–23)
CO2: 27 mmol/L (ref 22–32)
Calcium: 8.6 mg/dL — ABNORMAL LOW (ref 8.9–10.3)
Chloride: 98 mmol/L (ref 98–111)
Creatinine, Ser: 1.16 mg/dL — ABNORMAL HIGH (ref 0.44–1.00)
GFR calc Af Amer: 48 mL/min — ABNORMAL LOW (ref >60)
GFR calc non Af Amer: 42 mL/min — ABNORMAL LOW (ref >60)
Glucose, Bld: 112 mg/dL — ABNORMAL HIGH (ref 70–99)
Phosphorus: 2.7 mg/dL (ref 2.5–4.6)
Potassium: 4.3 mmol/L (ref 3.5–5.1)
Sodium: 132 mmol/L — ABNORMAL LOW (ref 135–145)

## 2020-04-10 LAB — CK: Total CK: 77 U/L (ref 38–234)

## 2020-04-10 LAB — GLUCOSE, CAPILLARY: Glucose-Capillary: 110 mg/dL — ABNORMAL HIGH (ref 70–99)

## 2020-04-10 LAB — MAGNESIUM: Magnesium: 2 mg/dL (ref 1.7–2.4)

## 2020-04-10 MED ORDER — CYCLOBENZAPRINE HCL 10 MG PO TABS: 5.0000 mg | ORAL_TABLET | Freq: Three times a day (TID) | ORAL | Status: DC | PRN

## 2020-04-10 MED ORDER — MORPHINE SULFATE (PF) 2 MG/ML IV SOLN
1.0000 mg | INTRAVENOUS | Status: DC | PRN
  Administered 2020-04-10: 1 mg via INTRAVENOUS
  Filled 2020-04-10: qty 1

## 2020-04-10 MED ORDER — SODIUM CHLORIDE 0.9 % IV BOLUS
250.0000 mL | Freq: Once | INTRAVENOUS | Status: AC
  Administered 2020-04-10: 18:00:00 250 mL via INTRAVENOUS

## 2020-04-10 MED ORDER — DEXTROSE-NACL 5-0.9 % IV SOLN: INTRAVENOUS | Status: DC

## 2020-04-10 NOTE — Progress Notes (Signed)
PROGRESS NOTE  [name] SVX:[id number] DOB: [birth year]   PCP: Valerie Roys, DO  Patient is from: Home (senior living apartment complex).  Independent with ADLs prior to admission.  DOA: 04/08/2020 LOS: 0  Brief Narrative / Interim history: 86 year old female with PMH of COPD on 2 L, A. fib/PSVT, HTN, hypothyroidism, CKD-3B, colon cancer status post right colectomy, depression, anxiety and hearing impairment brought to ED by EMS after she slipped and fell while walking down a ramp.  She suffered an injury to her right face with large right-sided hematoma, and acute impacted and mildly displaced fracture through the surgical neck of the proximal left humerus and displaced fracture fragment arising from the greater tuberosity.  CT head and cervical spine without acute finding.  CT maxillofacial negative for acute facial fracture or intraorbital hematoma.  Hemodynamically stable except for mild tachycardia.  Basic labs and UA without significant finding.  High-sensitivity troponin elevated to 924.  EKG without acute ischemic finding.  Patient was admitted for further evaluation and management of possible syncopal event.   High-sensitivity troponin peaked at 1176 and down to 914.  Cardiology did not feel this is a cardiac event or ACS.  Echocardiogram is pending.   Subjective: Not examined earlier this morning.  No major events overnight of this morning.  She is somewhat disoriented this morning.  She is only oriented to self.  Does not recognize she is in the hospital.  She reports pain in her left arm.  Objective: Vitals:   04/10/20 0027 04/10/20 0500 04/10/20 0527 04/10/20 0825  BP: (!) 145/65  (!) 140/51 130/63  Pulse: 82  79 97  Resp: 16  16 17   Temp: 97.7 F (36.5 C)  97.8 F (36.6 C) 98.3 F (36.8 C)  TempSrc: Oral  Oral   SpO2: 97%  98% (!) 88%  Weight:  37.6 kg    Height:        Intake/Output Summary (Last 24 hours) at 04/10/2020 1106 Last data filed at 04/10/2020  2330 Gross per 24 hour  Intake 50 ml  Output 500 ml  Net -450 ml   Filed Weights   04/08/20 2240 04/09/20 0500 04/10/20 0500  Weight: 42.8 kg 44.7 kg 37.6 kg    Examination: GENERAL: Frail looking elderly female.  Nontoxic. HEENT: MMM.  Right facial bruising and ecchymosis.  Bulbar conjunctival injection/subconjunctival hemorrhage in the right.  PERRL.  EOMI.  Vision grossly intact.  Diminished hearing. NECK: Supple.  No apparent JVD.  RESP: On room air.  No IWOB.  Fair aeration bilaterally. CVS: HR 110-120.  Regular rhythm. Heart sounds normal.  ABD/GI/GU: BS+. Abd soft, NTND.  MSK/EXT:  Moves extremities except LUE.  SKIN: Evidence of chronic venous insufficiency bilaterally. NEURO: Awake.  Oriented only to self.  Follows commands.  No apparent focal neuro deficit. PSYCH: Calm.  Appears to be in some degree of pain.  Procedures:  None  Microbiology summarized: COVID-19 PCR negative.  Assessment & Plan: Mechanical fall at home-reportedly slipped and fell while walking down the ramp Right facial hematoma Left proximal humeral neck fracture -CT head/cervical spine/maxillofacial without significant finding other than right facial hematoma -Discussed with Ortho, Dr. Harlow Mares about the humeral fracture. He recommended sling and supportive care with outpatient follow-up in 2 weeks -Tylenol 1 g 3 times daily.  Change Norco to oxycodone 3 times daily as needed moderate pain -Add low-dose IV morphine as needed for severe pain. -Low-dose Flexeril -Continue sling for LUE -Bowel regimen  Elevated troponin:  High-sensitivity troponin 924> 1176>> 914.  Most likely demand ischemia.  No acute ischemic finding on EKG.  She denies chest pain. -Discussed with cardiology, Dr. Clayborn Bigness.  He does not think this is ACS or cardiac event.  -Follow echocardiogram-was not able to perform due to pain in LUE  Tachycardia: HR in the range of 110-120.  Could be autonomic response to pain.  -Pain  control as above. -Start gentle D5-NS at 60 cc an hour  Paroxysmal atrial fibrillation/pSVT: On metoprolol for rate control.  Not on anticoagulation likely due to frequent fall.  Seems to have ectopic rhythm on EKG. -Continue home metoprolol  Chronic COPD/respiratory failure: On 2 L at home.  Stable. -Continue Dulera, as needed albuterol, and supplemental oxygen  Essential hypertension: BP within fair range. -Continue home amlodipine and Lopressor.  Hypothyroidism: -Continue Synthroid.  CKD stage IIIB: Stable. -Monitor intermittently  Mild hyponatremia: Stable. -Continue monitoring  Anxiety: Stable. -Continue low-dose Atarax as needed.   Leukocytosis: Likely demargination.  No other signs of infection.  No cardiopulmonary symptoms.  UA without signs of UTI. -Continue monitoring  Debility/physical deconditioning -PT/OT  DNR/DNI-patient with the above comorbidities.  Now with fall and LUE fracture.  -Palliative care consult   Body mass index is 16.21 kg/m.         DVT prophylaxis:  SCDs Start: 04/08/20 1931  Code Status: DNR/DNI Family Communication: Updated patient's daughter, [name] over the phone. Status is: Observation  The patient will require care spanning > 2 midnights and should be moved to inpatient because: Ongoing active pain requiring inpatient pain management, IV treatments appropriate due to intensity of illness or inability to take PO and Inpatient level of care appropriate due to severity of illness and significant tachycardia  Dispo: The patient is from: Home              Anticipated d/c is to: SNF              Anticipated d/c date is: 2 days              Patient currently is not medically stable to d/c.            Consultants:  Orthopedic surgery-Dr. Harlow Mares over the phone Cardiology, Dr. Clayborn Bigness over the phone   Sch Meds:  Scheduled Meds: . amLODipine  2.5 mg Oral Daily  . feeding supplement (ENSURE ENLIVE)  237 mL Oral BID  BM  . levothyroxine  100 mcg Oral Daily  . metoprolol tartrate  50 mg Oral BID  . mometasone-formoterol  2 puff Inhalation BID  . sodium chloride flush  3 mL Intravenous Once  . sodium chloride flush  3 mL Intravenous Q12H   Continuous Infusions: PRN Meds:.acetaminophen **OR** acetaminophen, albuterol, cyclobenzaprine, HYDROcodone-acetaminophen, hydrOXYzine, morphine injection, ondansetron **OR** ondansetron (ZOFRAN) IV  Antimicrobials: Anti-infectives (From admission, onward)   None       I have personally reviewed the following labs and images: CBC: Recent Labs  Lab 04/08/20 1535 04/09/20 0522 04/09/20 1750  WBC 14.3* 22.2* 21.5*  NEUTROABS  --   --  18.8*  HGB 13.2 12.4 11.9*  HCT 38.5 35.7* 35.3*  MCV 85.9 85.4 88.3  PLT 183 151 152   BMP &GFR Recent Labs  Lab 04/08/20 1535 04/09/20 0522 04/10/20 0418  NA 134* 134* 132*  K 4.5 4.4 4.3  CL 100 100 98  CO2 24 24 27   GLUCOSE 141* 153* 112*  BUN 21 20 22   CREATININE 1.22* 1.14* 1.16*  CALCIUM 8.9  8.7* 8.6*  MG  --   --  2.0  PHOS  --   --  2.7   Estimated Creatinine Clearance: 19.5 mL/min (A) (by C-G formula based on SCr of 1.16 mg/dL (H)). Liver & Pancreas: Recent Labs  Lab 04/10/20 0418  ALBUMIN 3.3*   No results for input(s): LIPASE, AMYLASE in the last 168 hours. No results for input(s): AMMONIA in the last 168 hours. Diabetic: No results for input(s): HGBA1C in the last 72 hours. Recent Labs  Lab 04/09/20 0518 04/10/20 0532  GLUCAP 154* 110*   Cardiac Enzymes: Recent Labs  Lab 04/10/20 0418  CKTOTAL 77   No results for input(s): PROBNP in the last 8760 hours. Coagulation Profile: No results for input(s): INR, PROTIME in the last 168 hours. Thyroid Function Tests: No results for input(s): TSH, T4TOTAL, FREET4, T3FREE, THYROIDAB in the last 72 hours. Lipid Profile: No results for input(s): CHOL, HDL, LDLCALC, TRIG, CHOLHDL, LDLDIRECT in the last 72 hours. Anemia Panel: No results for  input(s): VITAMINB12, FOLATE, FERRITIN, TIBC, IRON, RETICCTPCT in the last 72 hours. Urine analysis:    Component Value Date/Time   COLORURINE YELLOW (A) 04/08/2020 1644   APPEARANCEUR HAZY (A) 04/08/2020 1644   APPEARANCEUR Clear 01/05/2020 0939   LABSPEC 1.011 04/08/2020 1644   PHURINE 7.0 04/08/2020 1644   GLUCOSEU NEGATIVE 04/08/2020 1644   HGBUR SMALL (A) 04/08/2020 1644   BILIRUBINUR NEGATIVE 04/08/2020 1644   BILIRUBINUR Negative 01/05/2020 0939   KETONESUR 5 (A) 04/08/2020 1644   PROTEINUR NEGATIVE 04/08/2020 1644   NITRITE NEGATIVE 04/08/2020 [place] 04/08/2020 1644   Sepsis Labs: Invalid input(s): PROCALCITONIN, [place]  Microbiology: Recent Results (from the past 240 hour(s))  SARS Coronavirus 2 by RT PCR (hospital order, performed in [facility] hospital lab) Nasopharyngeal Nasopharyngeal Swab     Status: None   Collection Time: 04/08/20  5:42 PM   Specimen: Nasopharyngeal Swab  Result Value Ref Range Status   SARS Coronavirus 2 NEGATIVE NEGATIVE Final    Comment: (NOTE) SARS-CoV-2 target nucleic acids are NOT DETECTED.  The SARS-CoV-2 RNA is generally detectable in upper and lower respiratory specimens during the acute phase of infection. The lowest concentration of SARS-CoV-2 viral copies this assay can detect is 250 copies / mL. A negative result does not preclude SARS-CoV-2 infection and should not be used as the sole basis for treatment or other patient management decisions.  A negative result may occur with improper specimen collection / handling, submission of specimen other than nasopharyngeal swab, presence of viral mutation(s) within the areas targeted by this assay, and inadequate number of viral copies (<250 copies / mL). A negative result must be combined with clinical observations, patient history, and epidemiological information.  Fact Sheet for Patients:   StrictlyIdeas.no  Fact Sheet for  Healthcare Providers: BankingDealers.co.za  This test is not yet approved or  cleared by the [place] FDA and has been authorized for detection and/or diagnosis of SARS-CoV-2 by FDA under an Emergency Use Authorization (EUA).  This EUA will remain in effect (meaning this test can be used) for the duration of the COVID-19 declaration under Section 564(b)(1) of the Act, 21 U.S.C. section 360bbb-3(b)(1), unless the authorization is terminated or revoked sooner.  Performed at [facility], [address] [place], [place] [address]     Radiology Studies: DG Thoracic Spine 2 View  Result Date: 04/09/2020 CLINICAL DATA:  Fall.  Back pain EXAM: THORACIC SPINE 2 VIEWS COMPARISON:  None. FINDINGS:  Sigmoid scoliosis of the thoracolumbar spine. Loss of vertebral body height. No subluxation. Normal paraspinal line. IMPRESSION: No acute findings of the thoracic spine. Electronically Signed   By: Suzy Bouchard M.D.   On: 04/09/2020 13:53   DG Lumbar Spine 2-3 Views  Result Date: 04/09/2020 CLINICAL DATA:  Inpatient.  Fall yesterday.  Back pain. EXAM: LUMBAR SPINE - 2-3 VIEW COMPARISON:  06/22/2018 CT angiogram of the abdomen and pelvis FINDINGS: This report assumes 5 non rib-bearing lumbar vertebrae. Moderate rotatory levoscoliosis of the lumbar spine. Lumbar vertebral body heights are preserved, with no fracture. Moderate multilevel degenerative disc disease throughout the lumbar spine, most prominent at L5-S1. Stable minimal 2 mm retrolisthesis at L1-2 and L2-3. Moderate to marked bilateral lumbar facet arthropathy. No aggressive appearing focal osseous lesions. Abdominal aortic atherosclerosis. IMPRESSION: 1. No lumbar spine fracture or acute malalignment. 2. Moderate rotatory levoscoliosis of the lumbar spine. 3. Moderate multilevel lumbar degenerative disc disease, most prominent at L5-S1. 4. Moderate to marked bilateral lumbar facet arthropathy. Electronically  Signed   By: Ilona Sorrel M.D.   On: 04/09/2020 13:55      Sherri Mcarthy T. Coleman  If 7PM-7AM, please contact night-coverage [email or web address] Password [email or web address] 04/10/2020, 11:06 AM

## 2020-04-10 NOTE — Plan of Care (Signed)
Patient A/Ox4 can verbally respond with clear speech and resting in bed all of shift. Total care with ADL's and incontinent of B/B. Hard of hearing in right ear, no vision problems at this time. Tolerated all schedule medication PO well, no adverse reaction, or side effects noted. Denies distress and discomfort. Medicated x 1 with prn Norco/Vicodin 5-325 mg for acute pain rate 8/10 in left shoulder. Effectiveness pending, will continue to monitor.

## 2020-04-10 NOTE — Progress Notes (Signed)
*  PRELIMINARY RESULTS* Echocardiogram 2D Echocardiogram has been performed.  [name] 04/10/2020, 7:56 AM

## 2020-04-10 NOTE — Progress Notes (Addendum)
EKG ordered due to pt HR being elevated, EKG completed to reveal Afib w/ RVR with a HR of 135. Pt asymptomatic, MD notified of result. 250cc NS bolus ordered- pt currently on metoprolol. Pt is going between SR and Afib RVR. No other changes to plan of care at this time

## 2020-04-10 NOTE — Progress Notes (Signed)
Patient became tachycardic to with HR in 130's but quickly improved to 110's. EKG with Afib in RVR. She is somewhat asymptomatic from this. BP within normal range. Will give NS bolus 250 cc once. Continue D5-NS@60cc . Continue metoprolol. Not a candidate for anticoagulation. Follow Echo.

## 2020-04-11 DIAGNOSIS — D649 Anemia, unspecified: Secondary | ICD-10-CM

## 2020-04-11 DIAGNOSIS — Z7189 Other specified counseling: Secondary | ICD-10-CM

## 2020-04-11 DIAGNOSIS — Z515 Encounter for palliative care: Secondary | ICD-10-CM

## 2020-04-11 DIAGNOSIS — D696 Thrombocytopenia, unspecified: Secondary | ICD-10-CM

## 2020-04-11 DIAGNOSIS — I4891 Unspecified atrial fibrillation: Secondary | ICD-10-CM

## 2020-04-11 LAB — RENAL FUNCTION PANEL
Albumin: 2.8 g/dL — ABNORMAL LOW (ref 3.5–5.0)
Anion gap: 7 (ref 5–15)
BUN: 20 mg/dL (ref 8–23)
CO2: 26 mmol/L (ref 22–32)
Calcium: 7.9 mg/dL — ABNORMAL LOW (ref 8.9–10.3)
Chloride: 102 mmol/L (ref 98–111)
Creatinine, Ser: 0.89 mg/dL (ref 0.44–1.00)
GFR calc Af Amer: >60 mL/min (ref >60)
GFR calc non Af Amer: 57 mL/min — ABNORMAL LOW (ref >60)
Glucose, Bld: 109 mg/dL — ABNORMAL HIGH (ref 70–99)
Phosphorus: 1.3 mg/dL — ABNORMAL LOW (ref 2.5–4.6)
Potassium: 4 mmol/L (ref 3.5–5.1)
Sodium: 135 mmol/L (ref 135–145)

## 2020-04-11 LAB — GLUCOSE, CAPILLARY: Glucose-Capillary: 147 mg/dL — ABNORMAL HIGH (ref 70–99)

## 2020-04-11 LAB — CBC
HCT: 28 % — ABNORMAL LOW (ref 36.0–46.0)
Hemoglobin: 9.1 g/dL — ABNORMAL LOW (ref 12.0–15.0)
MCH: 29.2 pg (ref 26.0–34.0)
MCHC: 32.5 g/dL (ref 30.0–36.0)
MCV: 89.7 fL (ref 80.0–100.0)
Platelets: 122 10*3/uL — ABNORMAL LOW (ref 150–400)
RBC: 3.12 MIL/uL — ABNORMAL LOW (ref 3.87–5.11)
RDW: 14 % (ref 11.5–15.5)
WBC: 8.2 10*3/uL (ref 4.0–10.5)
nRBC: 0 % (ref 0.0–0.2)

## 2020-04-11 LAB — MAGNESIUM: Magnesium: 1.8 mg/dL (ref 1.7–2.4)

## 2020-04-11 MED ORDER — POLYETHYLENE GLYCOL 3350 17 G PO PACK: 17.0000 g | PACK | Freq: Two times a day (BID) | ORAL | Status: DC | PRN

## 2020-04-11 MED ORDER — SODIUM PHOSPHATES 45 MMOLE/15ML IV SOLN
30.0000 mmol | Freq: Once | INTRAVENOUS | Status: AC
  Administered 2020-04-11: 30 mmol via INTRAVENOUS
  Filled 2020-04-11: qty 10

## 2020-04-11 MED ORDER — SENNOSIDES-DOCUSATE SODIUM 8.6-50 MG PO TABS: 1.0000 | ORAL_TABLET | Freq: Two times a day (BID) | ORAL | Status: DC | PRN

## 2020-04-11 NOTE — TOC Initial Note (Signed)
Transition of Care [facility]) - Initial/Assessment Note    Patient Details  Name: [name] MRN: [id number] Date of Birth: 05-10-1928  Transition of Care [facility]) CM/SW Contact:    Elease Hashimoto, LCSW Phone Number: 04/11/2020, 9:27 AM  Clinical Narrative:  Pt was living in an independent senior living apartment, but has been falling the past few months. Which is very concerning for her daughter. She recently has lost her sister and eldest daughter and has been grieving. She is very HOH and needed to get information from her daughter. Her daughter checks on her but can not provide 24 hr care. She feels best option is to go to rehab with the option of staying long term. Palliative consult to be done today and both agreeable for this worker to begin SNF search and pref [facility] where [facility] was or [facility] there before. Will begin FL2 and bed search. Pt has been vaccinated and tested negative upon admission to hospital.              Expected Discharge Plan: Skilled Nursing Facility Barriers to Discharge: Continued Medical Work up   Patient Goals and CMS Choice Patient states their goals for this hospitalization and ongoing recovery are:: My MOm needs to be somewhere she can get care. CMS Medicare.gov Compare Post Acute Care list provided to:: Patient Represenative (must comment) (Debbie-daughter) Choice offered to / list presented to : Patient, Adult Children  Expected Discharge Plan and Services Expected Discharge Plan: [place] In-house Referral: Clinical Social Work   Post Acute Care Choice: [place] Living arrangements for the past 2 months: Apartment                                      Prior Living Arrangements/Services Living arrangements for the past 2 months: Apartment Lives with:: Self Patient language and need for interpreter reviewed:: No Do you feel safe going back to the place where you live?: Yes      Need for Family  Participation in Patient Care: No (Comment) Care giver support system in place?: No (comment) Current home services: DME (cane) Criminal Activity/Legal Involvement Pertinent to Current Situation/Hospitalization: No - Comment as needed  Activities of Daily Living Home Assistive Devices/Equipment: Built-in shower seat, Hearing aid, Grab bars in shower ADL Screening (condition at time of admission) Patient's cognitive ability adequate to safely complete daily activities?: Yes Is the patient deaf or have difficulty hearing?: Yes Does the patient have difficulty seeing, even when wearing glasses/contacts?: Yes Does the patient have difficulty concentrating, remembering, or making decisions?: No Patient able to express need for assistance with ADLs?: Yes Does the patient have difficulty dressing or bathing?: Yes Independently performs ADLs?: No Communication: Independent Dressing (OT): Needs assistance Grooming: Needs assistance Feeding: Needs assistance Bathing: Needs assistance Toileting: Needs assistance In/Out Bed: Needs assistance Walks in Home: Needs assistance Does the patient have difficulty walking or climbing stairs?: Yes Weakness of Legs: None Weakness of Arms/Hands: Left  Permission Sought/Granted Permission sought to share information with : Facility Sport and exercise psychologist, Family Supports Permission granted to share information with : Yes, Verbal Permission Granted  Share Information with NAME: [name]  Permission granted to share info w AGENCY: SNF's  Permission granted to share info w Relationship: daughter     Emotional Assessment Appearance:: Appears stated age Attitude/Demeanor/Rapport: Engaged, Other (comment) (very HOH) Affect (typically observed): Accepting Orientation: : Oriented  to Self, Oriented to Place Alcohol / Substance Use: Not Applicable Psych Involvement: No (comment)  Admission diagnosis:  Syncope and collapse [R55] Fall [W19.XXXA] Fall, initial  encounter [id number]] Tachycardia [R00.0] Patient Active Problem List   Diagnosis Date Noted  . Tachycardia 04/10/2020  . Fall 04/08/2020  . Fx humeral neck, left, closed, initial encounter 04/08/2020  . Hematoma of face, initial encounter 04/08/2020  . CKD (chronic kidney disease) stage 3, GFR 30-59 ml/min 04/08/2020  . Elevated troponin 04/08/2020  . Confusion 01/05/2020  . Right hip pain 11/24/2019  . Headache disorder 01/29/2019  . Vertigo 12/03/2018  . History of colon cancer, stage II 07/24/2018  . IDA (iron deficiency anemia) 07/24/2018  . Protein-calorie malnutrition ([place]) 06/19/2018  . Senile purpura ([place]) 06/19/2018  . Anxiety 04/28/2018  . Cataract of both eyes 04/28/2018  . Diverticulosis 04/28/2018  . DJD (degenerative joint disease) 04/28/2018  . Oxygen dependent 02/07/2018  . COPD (chronic obstructive pulmonary disease) ([place])   . Depression   . Overactive bladder   . Hypothyroidism   . History of fall 08/13/2017  . Renal artery stenosis ([place]) 12/18/2016  . Chronic respiratory failure with hypoxia ([place]) 10/25/2015  . History of DVT (deep vein thrombosis) 10/11/2014  . PSVT (paroxysmal supraventricular tachycardia) ([place]) 10/22/2013  . Essential hypertension 12/20/2011  . PAF (paroxysmal atrial fibrillation) ([place]) 08/22/2011   PCP:  Valerie Roys, DO Pharmacy:   [facility] [address] [place] [address] [place] [address] Phone: [phone] Fax: [phone]     Social Determinants of Health (SDOH) Interventions    Readmission Risk Interventions No flowsheet data found.

## 2020-04-11 NOTE — NC FL2 (Signed)
[place] LEVEL OF CARE SCREENING TOOL     IDENTIFICATION  Patient Name: [name] Birthdate: 13-Nov-1927 Sex: female Admission Date (Current Location): 04/08/2020  [place] and [place] Number:  Selena Lesser [id number] T Facility and Address:  [facility], [address], [place], [place] [address]      Provider Number: [id number]  Attending Physician Name and Address:  Mercy Riding, MD  Relative Name and Phone Number:  Suezanne Jacquet [phone]    Current Level of Care: Hospital Recommended Level of Care: [place] Prior Approval Number:    Date Approved/Denied:   PASRR Number: [id number]  Discharge Plan: SNF    Current Diagnoses: Patient Active Problem List   Diagnosis Date Noted  . Tachycardia 04/10/2020  . Fall 04/08/2020  . Fx humeral neck, left, closed, initial encounter 04/08/2020  . Hematoma of face, initial encounter 04/08/2020  . CKD (chronic kidney disease) stage 3, GFR 30-59 ml/min 04/08/2020  . Elevated troponin 04/08/2020  . Confusion 01/05/2020  . Right hip pain 11/24/2019  . Headache disorder 01/29/2019  . Vertigo 12/03/2018  . History of colon cancer, stage II 07/24/2018  . IDA (iron deficiency anemia) 07/24/2018  . Protein-calorie malnutrition ([place]) 06/19/2018  . Senile purpura ([place]) 06/19/2018  . Anxiety 04/28/2018  . Cataract of both eyes 04/28/2018  . Diverticulosis 04/28/2018  . DJD (degenerative joint disease) 04/28/2018  . Oxygen dependent 02/07/2018  . COPD (chronic obstructive pulmonary disease) ([place])   . Depression   . Overactive bladder   . Hypothyroidism   . History of fall 08/13/2017  . Renal artery stenosis ([place]) 12/18/2016  . Chronic respiratory failure with hypoxia ([place]) 10/25/2015  . History of DVT (deep vein thrombosis) 10/11/2014  . PSVT (paroxysmal supraventricular tachycardia) ([place]) 10/22/2013  . Essential hypertension 12/20/2011  . PAF (paroxysmal  atrial fibrillation) ([place]) 08/22/2011    Orientation RESPIRATION BLADDER Height & Weight     Self, Place  O2 (O2-2 liters at night at times during the day) External catheter Weight: 94 lb (42.6 kg) Height:  5' (152.4 cm)  BEHAVIORAL SYMPTOMS/MOOD NEUROLOGICAL BOWEL NUTRITION STATUS      Continent Diet (heart healthy thin liquids)  AMBULATORY STATUS COMMUNICATION OF NEEDS Skin   Limited Assist Verbally Skin abrasions, Bruising                       Personal Care Assistance Level of Assistance  Bathing, Dressing Bathing Assistance: Limited assistance   Dressing Assistance: Limited assistance     Functional Limitations Info  Hearing   Hearing Info: Impaired      SPECIAL CARE FACTORS FREQUENCY  PT (By licensed PT), OT (By licensed OT)     PT Frequency: 5x week OT Frequency: 5x week            Contractures Contractures Info: Not present    Additional Factors Info  Code Status, Allergies, Isolation Precautions Code Status Info: DNR Allergies Info: Nortriptyline     Isolation Precautions Info: Hx-MRSA     Current Medications (04/11/2020):  This is the current hospital active medication list Current Facility-Administered Medications  Medication Dose Route Frequency Provider Last Rate Last Admin  . acetaminophen (TYLENOL) tablet 650 mg  650 mg Oral Q6H PRN Lenore Cordia, MD       Or  . acetaminophen (TYLENOL) suppository 650 mg  650 mg Rectal Q6H PRN Zada Finders R, MD      . albuterol (PROVENTIL) (2.5 MG/3ML) 0.083% nebulizer  solution 2.5 mg  2.5 mg Inhalation Q4H PRN Zada Finders R, MD      . amLODipine (NORVASC) tablet 2.5 mg  2.5 mg Oral Daily Zada Finders R, MD   2.5 mg at 04/10/20 0932  . cyclobenzaprine (FLEXERIL) tablet 5 mg  5 mg Oral TID PRN Gonfa, Taye T, MD      . dextrose 5 %-0.9 % sodium chloride infusion   Intravenous Continuous Wendee Beavers T, MD 50 mL/hr at 04/11/20 0732 Rate Change at 04/11/20 0732  . feeding supplement (ENSURE ENLIVE)  (ENSURE ENLIVE) liquid 237 mL  237 mL Oral BID BM Zada Finders R, MD   237 mL at 04/10/20 1424  . HYDROcodone-acetaminophen (NORCO/VICODIN) 5-325 MG per tablet 1-2 tablet  1-2 tablet Oral Q4H PRN Lenore Cordia, MD   1 tablet at 04/10/20 2156  . hydrOXYzine (ATARAX/VISTARIL) tablet 10 mg  10 mg Oral TID PRN Lenore Cordia, MD      . levothyroxine (SYNTHROID) tablet 100 mcg  100 mcg Oral Daily Lenore Cordia, MD   100 mcg at 04/11/20 0552  . metoprolol tartrate (LOPRESSOR) tablet 50 mg  50 mg Oral BID Lenore Cordia, MD   50 mg at 04/10/20 2150  . mometasone-formoterol (DULERA) 200-5 MCG/ACT inhaler 2 puff  2 puff Inhalation BID Lenore Cordia, MD   2 puff at 04/10/20 2151  . morphine 2 MG/ML injection 1 mg  1 mg Intravenous Q2H PRN Wendee Beavers T, MD   1 mg at 04/10/20 1212  . ondansetron (ZOFRAN) tablet 4 mg  4 mg Oral Q6H PRN Lenore Cordia, MD       Or  . ondansetron (ZOFRAN) injection 4 mg  4 mg Intravenous Q6H PRN Zada Finders R, MD      . sodium chloride flush (NS) 0.9 % injection 3 mL  3 mL Intravenous Once Lavonia Drafts, MD      . sodium chloride flush (NS) 0.9 % injection 3 mL  3 mL Intravenous Q12H Lenore Cordia, MD   3 mL at 04/10/20 0934  . sodium phosphate 30 mmol in dextrose 5 % 250 mL infusion  30 mmol Intravenous Once Mercy Riding, MD         Discharge Medications: Please see discharge summary for a list of discharge medications.  Relevant Imaging Results:  Relevant Lab Results:   Additional Information SSN: [id number]  Has had COVID vaccinated  Estes Lehner, Gardiner Rhyme, LCSW

## 2020-04-11 NOTE — Plan of Care (Signed)
  Problem: Education: Goal: Knowledge of General Education information will improve Description Including pain rating scale, medication(s)/side effects and non-pharmacologic comfort measures Outcome: Progressing   Problem: Health Behavior/Discharge Planning: Goal: Ability to manage health-related needs will improve Outcome: Progressing   

## 2020-04-11 NOTE — Consult Note (Addendum)
Consultation Note Date: 04/11/2020   Patient Name: [name]  DOB: Jan 30, 1928  MRN: [id number]  Age / Sex: 86 y.o., female  PCP: Valerie Roys, DO Referring Physician: Mercy Riding, MD  Reason for Consultation: Establishing goals of care  HPI/Patient Profile: [name] is a 86 y.o. female with medical history significant for paroxysmal atrial fibrillation on aspirin alone, pSVT, hypertension, hypothyroidism, COPD on 2L O2 via [place] at night and with activity, CKD stage III, colon cancer s/p right colectomy, depression/anxiety, and very hard of hearing who presents to the ED for evaluation after a fall.  Clinical Assessment and Goals of Care: Patient is resting in bed. She is very HOH and discusses that she can hear much better with her hearing aides, but the battery died.  She states she has 2/4 living children. She states she has been living in a rental house. She tells me she does not use assistive devices, and she drives. She discusses that she fell and injured and face, pointing to the bruising on her face, and broke her arm, pointing to the arm.   We discussed her diagnosis, prognosis, GOC, EOL wishes disposition and options.  A detailed discussion was had today regarding advanced directives.  Concepts specific to code status, artifical feeding and hydration, IV antibiotics and rehospitalization were discussed.  The difference between an aggressive medical intervention path and a comfort care path was discussed.  Values and goals of care important to patient and family were attempted to be elicited.  Discussed limitations of medical interventions to prolong quality of life in some situations and discussed the concept of human mortality.  She states she wants to try to do what is possible to live as long as possible. Upon discussing code status, she states "try to save me if you can."  Patient is currently a DNR.   Unable to converse enough due to patient's severe hearing loss to determine higher level decision making at this time. Attempted to contact daughter [name] multiple times, but received message that the call cannot be completed at this time. No other number listed under contact information. Recommend further conversations to determine [place].     SUMMARY OF RECOMMENDATIONS   Recommend ongoing conversations about [place]. Patient states she would want CPR. Per notes she is DNR. Due to severe HOH, unable to assess higher level decision making.   Prognosis:   Unable to determine      Primary Diagnoses: Present on Admission: . Fall . COPD (chronic obstructive pulmonary disease) ([place]) . PAF (paroxysmal atrial fibrillation) ([place]) . Hypothyroidism . Essential hypertension . Anxiety . Tachycardia   I have reviewed the medical record, interviewed the patient and family, and examined the patient. The following aspects are pertinent.  Past Medical History:  Diagnosis Date  . Anxiety   . Atrial fibrillation ([place])   . Cancer ([place])    COLON CANCER STAGE 2  . Clotting disorder (HCC)    left ankle  . COPD (chronic obstructive pulmonary disease) ([place])   .  Depression   . Hypertension   . Hypothyroidism   . IDA (iron deficiency anemia)   . Melena 06/22/2018  . Obstructive chronic bronchitis without exacerbation ([place]) 12/18/2016  . Overactive bladder   . Thyroid disease    Social History   Socioeconomic History  . Marital status: Divorced    Spouse name: Not on file  . Number of children: Not on file  . Years of education: Not on file  . Highest education level: High school graduate  Occupational History  . Occupation: retired  Tobacco Use  . Smoking status: Former Smoker    Years: 25.00    Types: Cigarettes    Quit date: 02/01/1986    Years since quitting: 34.2  . Smokeless tobacco: Never Used  . Tobacco comment: quit in 1987   Vaping Use  . Vaping  Use: Never used  Substance and Sexual Activity  . Alcohol use: Never  . Drug use: Never  . Sexual activity: Not Currently  Other Topics Concern  . Not on file  Social History Narrative   Has 3 daughters, 2 local    Social Determinants of Health   Financial Resource Strain:   . Difficulty of Paying Living Expenses:   Food Insecurity:   . Worried About Charity fundraiser in the Last Year:   . Arboriculturist in the Last Year:   Transportation Needs:   . Film/video editor (Medical):   Marland Kitchen Lack of Transportation (Non-Medical):   Physical Activity:   . Days of Exercise per Week:   . Minutes of Exercise per Session:   Stress:   . Feeling of Stress :   Social Connections:   . Frequency of Communication with Friends and Family:   . Frequency of Social Gatherings with Friends and Family:   . Attends Religious Services:   . Active Member of Clubs or Organizations:   . Attends Archivist Meetings:   Marland Kitchen Marital Status:    Family History  Problem Relation Age of Onset  . Heart disease Mother   . Heart failure Mother   . Parkinson's disease Father   . Heart disease Sister   . Heart disease Son   . Colon cancer Daughter   . Liver cancer Daughter    Scheduled Meds: . amLODipine  2.5 mg Oral Daily  . feeding supplement (ENSURE ENLIVE)  237 mL Oral BID BM  . levothyroxine  100 mcg Oral Daily  . metoprolol tartrate  50 mg Oral BID  . mometasone-formoterol  2 puff Inhalation BID  . sodium chloride flush  3 mL Intravenous Once  . sodium chloride flush  3 mL Intravenous Q12H   Continuous Infusions: PRN Meds:.acetaminophen **OR** acetaminophen, albuterol, cyclobenzaprine, HYDROcodone-acetaminophen, hydrOXYzine, morphine injection, ondansetron **OR** ondansetron (ZOFRAN) IV Medications Prior to Admission:  Prior to Admission medications   Medication Sig Start Date End Date Taking? Authorizing Provider  acetaminophen (TYLENOL) 325 MG tablet Take 2 tablets (650 mg total)  by mouth every 6 (six) hours as needed for mild pain or fever. 07/01/18  Yes Piscoya, Jacqulyn Bath, MD  amLODipine (NORVASC) 2.5 MG tablet Take 1 tablet (2.5 mg total) by mouth daily. 03/18/20  Yes Johnson, Megan P, DO  aspirin EC 81 MG tablet Take 1 tablet (81 mg total) by mouth daily. 07/21/19  Yes Johnson, Megan P, DO  ferrous sulfate 325 (65 FE) MG tablet Take 1 tablet (325 mg total) by mouth 3 (three) times daily with meals. 03/18/20  Yes Wynetta Emery,  Megan P, DO  fluticasone (FLONASE) 50 MCG/ACT nasal spray Place 2 sprays into both nostrils daily. 03/18/20  Yes Johnson, Megan P, DO  Fluticasone-Salmeterol (ADVAIR DISKUS) 250-50 MCG/DOSE AEPB Inhale 1 puff into the lungs daily. Rinse mouth after use. 03/18/20 03/18/21 Yes Johnson, Megan P, DO  hydrOXYzine (ATARAX/VISTARIL) 10 MG tablet Take 1 tablet (10 mg total) by mouth 3 (three) times daily as needed for anxiety. 02/05/20  Yes Johnson, Megan P, DO  latanoprost (XALATAN) 0.005 % ophthalmic solution Place 1 drop into both eyes at bedtime.    Yes [provider]  levothyroxine (EUTHYROX) 100 MCG tablet Take 1 tablet (100 mcg total) by mouth daily. 03/21/20  Yes Johnson, Megan P, DO  metoprolol tartrate (LOPRESSOR) 50 MG tablet Take 1 tablet (50 mg total) by mouth 2 (two) times daily. 03/18/20  Yes Johnson, Megan P, DO  multivitamin-lutein (OCUVITE-LUTEIN) CAPS capsule Take 1 capsule by mouth daily. 07/02/18  Yes Piscoya, Jose, MD  psyllium (METAMUCIL) 58.6 % powder Take 1 packet by mouth daily.   Yes [provider]  solifenacin (VESICARE) 10 MG tablet Take 1 tablet (10 mg total) by mouth daily. 03/18/20  Yes Johnson, Megan P, DO  feeding supplement, ENSURE ENLIVE, (ENSURE ENLIVE) LIQD Take 237 mLs by mouth 2 (two) times daily between meals. 07/21/19   Park Liter P, DO   Allergies  Allergen Reactions  . Nortriptyline     Causes night wondering   Review of Systems  HENT: Positive for hearing loss.     Physical Exam Pulmonary:      Effort: Pulmonary effort is normal.  Skin:    General: Skin is warm and dry.     Findings: Bruising present.     Comments: Bruising to right face   Neurological:     Mental Status: She is alert.     Vital Signs: BP 107/83 (BP Location: Left Arm)   Pulse 73   Temp 98.4 F (36.9 C) (Oral)   Resp 18   Ht 5' (1.524 m)   Wt 42.6 kg   SpO2 97%   BMI 18.36 kg/m  Pain Scale: 0-10   Pain Score: Asleep   SpO2: SpO2: 97 % O2 Device:SpO2: 97 % O2 Flow Rate: .O2 Flow Rate (L/min): 2 L/min  IO: Intake/output summary:   Intake/Output Summary (Last 24 hours) at 04/11/2020 1602 Last data filed at 04/11/2020 1500 Gross per 24 hour  Intake 1171.84 ml  Output 750 ml  Net 421.84 ml    LBM: Last BM Date:  (none since admission) Baseline Weight: Weight: 45.4 kg Most recent weight: Weight: 42.6 kg     Palliative Assessment/Data:   Contacted primary MD to make him aware of DNR status and conversation with patient.   Time In: 3:40 Time Out: 4:10 Time Total: 30 min Greater than 50%  of this time was spent counseling and coordinating care related to the above assessment and plan.  Signed by: Asencion Gowda, NP   Please contact Palliative Medicine Team phone at [phone] for questions and concerns.  For individual provider: See Shea Evans

## 2020-04-11 NOTE — TOC Progression Note (Signed)
Transition of Care [facility]) - Progression Note    Patient Details  Name: ANTONIA JICHA MRN: [id number] Date of Birth: Jan 07, 1928  [facility]) CM/SW Contact  Enora Trillo, Gardiner Rhyme, LCSW Phone Number: 04/11/2020, 12:16 PM  Clinical Narrative:   Bed offers via [facility] and [facility]. Ref number [id number] via [employer] have started auth for admission. Daughter has chosen [facility] and aware could be tomorrow if medically ready for transfer.    Expected Discharge Plan: [place] Barriers to Discharge: Continued Medical Work up  Expected Discharge Plan and Services Expected Discharge Plan: [place] In-house Referral: Clinical Social Work   Post Acute Care Choice: [place] Living arrangements for the past 2 months: Apartment                                       Social Determinants of Health (SDOH) Interventions    Readmission Risk Interventions No flowsheet data found.

## 2020-04-11 NOTE — Progress Notes (Signed)
Initial Nutrition Assessment  RD working remotely.  DOCUMENTATION CODES:   Underweight  INTERVENTION:  - continue Ensure Enlive BID, each supplement provides 350 kcal and 20 grams of protein. - will complete NFPE at follow-up.   NUTRITION DIAGNOSIS:   Increased nutrient needs related to acute illness as evidenced by estimated needs.  GOAL:   Patient will meet greater than or equal to 90% of their needs  MONITOR:   PO intake, Supplement acceptance, Labs, Weight trends  REASON FOR ASSESSMENT:   Consult Assessment of nutrition requirement/status  ASSESSMENT:   86 year old female with medical history of COPD on 2L O2, A. fib/PSVT, HTN, hypothyroidism, stage 3 CKD, colon cancer s/p R colectomy, depression, anxiety, and hearing impairment. She presented to the ED after slipping and falling while walking down a ramp with associated R-sided face injury and large R-sided hematoma, mild L humerus and displaced fracture fragment from tuberosity. CT maxillofacial negative for acute facial fx or intra-orbital hematoma.  RD is working on another campus and unable to see patient in person. Unable to reach patient by phone--notes indicate patient is hard of hearing.   No intakes documented since admission. Ensure Enlive was ordered BID on 8/7 and she has accepted all bottles of this supplement offered to her.   Weight today is 94 lb, weight on 8/6 was 94 lb, and was has been mainly stable with slight fluctuations since 02/06/19.   Per notes: - R facial hematoma and L humeral neck fx--supportive care - DNR/DNI with Palliative Care consulted   Labs reviewed; CBGs: 154, 110, 147 mg/dl, Ca: 7.9 mg/dl, Phos: 1.3 mg/dl, GFR: 57 ml/min. Medications reviewed; 100 mcg oral synthroid/day, 30 mmol IV NaPhos x1 run 8/9.     NUTRITION - FOCUSED PHYSICAL EXAM:  unable to complete at this time.   Diet Order:   Diet Order            Diet Heart Room service appropriate? Yes; Fluid consistency:  Thin  Diet effective now                 EDUCATION NEEDS:   No education needs have been identified at this time  Skin:  Skin Assessment: Reviewed RN Assessment  Last BM:  PTA/unknown  Height:   Ht Readings from Last 1 Encounters:  04/08/20 5' (1.524 m)    Weight:   Wt Readings from Last 1 Encounters:  04/11/20 42.6 kg    Estimated Nutritional Needs:  Kcal:  1450-1650 kcal Protein:  65-75 grams Fluid:  >/= 1.6 L/day     Jarome Matin, MS, RD, LDN, CNSC Inpatient Clinical Dietitian RD pager # available in [facility]  After hours/weekend pager # available in [facility]

## 2020-04-11 NOTE — Progress Notes (Addendum)
PROGRESS NOTE  [name] WLN:[id number] DOB: October 18, 1927   PCP: Valerie Roys, DO  Patient is from: Home (senior living apartment complex).  Independent with ADLs prior to admission.  DOA: 04/08/2020 LOS: 1  Brief Narrative / Interim history: 86 year old female with PMH of COPD on 2 L, A. fib/PSVT, HTN, hypothyroidism, CKD-3B, colon cancer status post right colectomy, depression, anxiety and hearing impairment brought to ED by EMS after she slipped and fell while walking down a ramp.  She suffered an injury to her right face with large right-sided hematoma, and acute impacted and mildly displaced fracture through the surgical neck of the proximal left humerus and displaced fracture fragment arising from the greater tuberosity.  CT head and cervical spine without acute finding.  CT maxillofacial negative for acute facial fracture or intraorbital hematoma.  Hemodynamically stable except for mild tachycardia.  Basic labs and UA without significant finding.  High-sensitivity troponin elevated to 924.  EKG without acute ischemic finding.  Patient was admitted for further evaluation and management of possible syncopal event.   High-sensitivity troponin peaked at 1176 and down to 914.  Cardiology did not feel this is a cardiac event or ACS.  Echocardiogram is pending.   Subjective: Seen and examined earlier this morning.  No major events overnight of this morning.  Resting comfortably.  Heart rate in 70s and 80s.  She is not a great historian partly due to impaired hearing and disorientation.  She is only oriented to self.  She thinks she is at friend's house.  Objective: Vitals:   04/11/20 0500 04/11/20 0520 04/11/20 0726 04/11/20 1134  BP:  132/66 130/73 107/83  Pulse:  77 75 73  Resp:  18    Temp:  99 F (37.2 C) 98.9 F (37.2 C) 98.4 F (36.9 C)  TempSrc:  Oral Oral Oral  SpO2:  95% 97% 97%  Weight: 42.6 kg     Height:        Intake/Output Summary (Last 24 hours) at 04/11/2020  1540 Last data filed at 04/11/2020 1500 Gross per 24 hour  Intake 1171.84 ml  Output 750 ml  Net 421.84 ml   Filed Weights   04/09/20 0500 04/10/20 0500 04/11/20 0500  Weight: 44.7 kg 37.6 kg 42.6 kg    Examination:  GENERAL: Frail looking elderly female.  Nontoxic. HEENT: MMM. Right facial bruising and ecchymosis.  Bulbar conjunctival injection/subconjunctival hemorrhage in the right.  PERRL.  EOMI.  Vision grossly intact.  Diminished hearing. NECK: Supple.  No apparent JVD.  RESP: On 2 L by [place].  No IWOB.  Fair aeration bilaterally. CVS:  RRR. Heart sounds normal.  ABD/GI/GU: BS+. Abd soft, NTND.  MSK/EXT:  Moves BLE.  LUE in sling. SKIN: Right facial bruising/ecchymosis.  Bruising over LUE proximally NEURO: Awake but only oriented to self.  Follows commands.  No apparent focal neuro deficit but limited exam PSYCH: Calm.  No distress or agitation.  Procedures:  None  Microbiology summarized: COVID-19 PCR negative.  Assessment & Plan: Mechanical fall at home-reportedly slipped and fell while walking down the ramp Right facial hematoma Left proximal humeral neck fracture -CT head/cervical spine/maxillofacial without significant finding other than right facial hematoma -Discussed with Ortho, Dr. Harlow Mares about the humeral fracture. He recommended sling and supportive care with outpatient follow-up in 2 weeks -Tylenol 1 g 3 times daily.  Change Norco to oxycodone 3 times daily as needed moderate pain -Continue low-dose IV morphine as needed for severe pain. -Continue low-dose Flexeril -Continue  sling for LUE -Bowel regimen   Elevated troponin: High-sensitivity troponin 924> 1176>> 914.  Most likely demand ischemia.  No acute ischemic finding on EKG.  She denies chest pain.  Echocardiogram reassuring. -Discussed with cardiology, Dr. Clayborn Bigness.  He does not think this is ACS or cardiac event.   Paroxysmal A. fib with RVR/PSVT: Now in NSR with HR in 70s to 80s.  Echocardiogram  reassuring. -Continue home metoprolol -No anticoagulation in the setting of hematoma   Chronic COPD/respiratory failure with hypoxia: On 2 L at home.  Stable. -Continue Dulera, as needed albuterol, and supplemental oxygen   Essential hypertension: BP within fair range. -Continue home amlodipine and Lopressor.   Hypothyroidism: -Continue Synthroid.   CKD stage IIIB: Stable. -Monitor intermittently  Mild hyponatremia: Resolved. -Continue monitoring   Anxiety: Stable. -Continue low-dose Atarax as needed.   Normocytic anemia/thrombocytopenia: Baseline Hgb 12-13> 13.2 (admit)>>> 9.1.  Drop likely from hematoma and dilution in the setting of IV fluid for tachycardia. -Continue monitoring -Check anemia panel -Discontinue IV fluid  Leukocytosis: Likely demargination.  No other signs of infection.  No cardiopulmonary symptoms.  UA without signs of UTI.  Resolved.  Hypophosphatemia: Replenish recheck.  Debility/physical deconditioning -PT/OT  DNR/DNI-patient with the above comorbidities.  Now with fall and LUE fracture.  -Palliative care consult   Body mass index is 18.36 kg/m. Nutrition Problem: Increased nutrient needs Etiology: acute illness Signs/Symptoms: estimated needs Interventions: Ensure Enlive (each supplement provides 350kcal and 20 grams of protein)   DVT prophylaxis:  SCDs Start: 04/08/20 1931  Code Status: DNR/DNI Family Communication: Updated patient's daughter, [name] over the phone. Status is: Inpatient  Remains inpatient appropriate because:Persistent severe electrolyte disturbances, Ongoing active pain requiring inpatient pain management, Unsafe d/c plan, IV treatments appropriate due to intensity of illness or inability to take PO and Inpatient level of care appropriate due to severity of illness   Dispo: The patient is from: Home              Anticipated d/c is to: SNF              Anticipated d/c date is: 1 day              Patient currently is  not medically stable to d/c.                Consultants:  Orthopedic surgery-Dr. Harlow Mares over the phone Cardiology, Dr. Clayborn Bigness over the phone Palliative medicine   Sch Meds:  Scheduled Meds:  amLODipine  2.5 mg Oral Daily   feeding supplement (ENSURE ENLIVE)  237 mL Oral BID BM   levothyroxine  100 mcg Oral Daily   metoprolol tartrate  50 mg Oral BID   mometasone-formoterol  2 puff Inhalation BID   sodium chloride flush  3 mL Intravenous Once   sodium chloride flush  3 mL Intravenous Q12H   Continuous Infusions:  sodium phosphate  Dextrose 5% IVPB 30 mmol (04/11/20 0957)   PRN Meds:.acetaminophen **OR** acetaminophen, albuterol, cyclobenzaprine, HYDROcodone-acetaminophen, hydrOXYzine, morphine injection, ondansetron **OR** ondansetron (ZOFRAN) IV  Antimicrobials: Anti-infectives (From admission, onward)    None        I have personally reviewed the following labs and images: CBC: Recent Labs  Lab 04/08/20 1535 04/09/20 0522 04/09/20 1750 04/11/20 0426  WBC 14.3* 22.2* 21.5* 8.2  NEUTROABS  --   --  18.8*  --   HGB 13.2 12.4 11.9* 9.1*  HCT 38.5 35.7* 35.3* 28.0*  MCV 85.9 85.4 88.3 89.7  PLT 183  151 152 122*   BMP &GFR Recent Labs  Lab 04/08/20 1535 04/09/20 0522 04/10/20 0418 04/11/20 0426  NA 134* 134* 132* 135  K 4.5 4.4 4.3 4.0  CL 100 100 98 102  CO2 24 24 27 26   GLUCOSE 141* 153* 112* 109*  BUN 21 20 22 20   CREATININE 1.22* 1.14* 1.16* 0.89  CALCIUM 8.9 8.7* 8.6* 7.9*  MG  --   --  2.0 1.8  PHOS  --   --  2.7 1.3*   Estimated Creatinine Clearance: 28.8 mL/min (by C-G formula based on SCr of 0.89 mg/dL). Liver & Pancreas: Recent Labs  Lab 04/10/20 0418 04/11/20 0426  ALBUMIN 3.3* 2.8*   No results for input(s): LIPASE, AMYLASE in the last 168 hours. No results for input(s): AMMONIA in the last 168 hours. Diabetic: No results for input(s): HGBA1C in the last 72 hours. Recent Labs  Lab 04/09/20 0518 04/10/20 0532  04/11/20 0522  GLUCAP 154* 110* 147*   Cardiac Enzymes: Recent Labs  Lab 04/10/20 0418  CKTOTAL 77   No results for input(s): PROBNP in the last 8760 hours. Coagulation Profile: No results for input(s): INR, PROTIME in the last 168 hours. Thyroid Function Tests: No results for input(s): TSH, T4TOTAL, FREET4, T3FREE, THYROIDAB in the last 72 hours. Lipid Profile: No results for input(s): CHOL, HDL, LDLCALC, TRIG, CHOLHDL, LDLDIRECT in the last 72 hours. Anemia Panel: No results for input(s): VITAMINB12, FOLATE, FERRITIN, TIBC, IRON, RETICCTPCT in the last 72 hours. Urine analysis:    Component Value Date/Time   COLORURINE YELLOW (A) 04/08/2020 1644   APPEARANCEUR HAZY (A) 04/08/2020 1644   APPEARANCEUR Clear 01/05/2020 0939   LABSPEC 1.011 04/08/2020 1644   PHURINE 7.0 04/08/2020 1644   GLUCOSEU NEGATIVE 04/08/2020 1644   HGBUR SMALL (A) 04/08/2020 1644   BILIRUBINUR NEGATIVE 04/08/2020 1644   BILIRUBINUR Negative 01/05/2020 0939   KETONESUR 5 (A) 04/08/2020 1644   PROTEINUR NEGATIVE 04/08/2020 1644   NITRITE NEGATIVE 04/08/2020 Organ 04/08/2020 1644   Sepsis Labs: Invalid input(s): PROCALCITONIN, [place]  Microbiology: Recent Results (from the past 240 hour(s))  SARS Coronavirus 2 by RT PCR (hospital order, performed in [facility] hospital lab) Nasopharyngeal Nasopharyngeal Swab     Status: None   Collection Time: 04/08/20  5:42 PM   Specimen: Nasopharyngeal Swab  Result Value Ref Range Status   SARS Coronavirus 2 NEGATIVE NEGATIVE Final    Comment: (NOTE) SARS-CoV-2 target nucleic acids are NOT DETECTED.  The SARS-CoV-2 RNA is generally detectable in upper and lower respiratory specimens during the acute phase of infection. The lowest concentration of SARS-CoV-2 viral copies this assay can detect is 250 copies / mL. A negative result does not preclude SARS-CoV-2 infection and should not be used as the sole basis for treatment or  other patient management decisions.  A negative result may occur with improper specimen collection / handling, submission of specimen other than nasopharyngeal swab, presence of viral mutation(s) within the areas targeted by this assay, and inadequate number of viral copies (<250 copies / mL). A negative result must be combined with clinical observations, patient history, and epidemiological information.  Fact Sheet for Patients:   StrictlyIdeas.no  Fact Sheet for Healthcare Providers: BankingDealers.co.za  This test is not yet approved or  cleared by the [place] FDA and has been authorized for detection and/or diagnosis of SARS-CoV-2 by FDA under an Emergency Use Authorization (EUA).  This EUA will remain in effect (meaning this test can  be used) for the duration of the COVID-19 declaration under Section 564(b)(1) of the Act, 21 U.S.C. section 360bbb-3(b)(1), unless the authorization is terminated or revoked sooner.  Performed at [facility], [address], [place], [place] [address]     Radiology Studies: No results found.    Tequita Marrs T. [place]  If 7PM-7AM, please contact night-coverage [email or web address] Password [email or web address] 04/11/2020, 3:40 PM

## 2020-04-12 DIAGNOSIS — I10 Essential (primary) hypertension: Secondary | ICD-10-CM | POA: Diagnosis not present

## 2020-04-12 DIAGNOSIS — S42302D Unspecified fracture of shaft of humerus, left arm, subsequent encounter for fracture with routine healing: Secondary | ICD-10-CM | POA: Diagnosis not present

## 2020-04-12 DIAGNOSIS — R296 Repeated falls: Secondary | ICD-10-CM | POA: Diagnosis not present

## 2020-04-12 DIAGNOSIS — W19XXXA Unspecified fall, initial encounter: Secondary | ICD-10-CM | POA: Diagnosis not present

## 2020-04-12 DIAGNOSIS — Y9301 Activity, walking, marching and hiking: Secondary | ICD-10-CM | POA: Diagnosis not present

## 2020-04-12 DIAGNOSIS — Z7401 Bed confinement status: Secondary | ICD-10-CM | POA: Diagnosis not present

## 2020-04-12 DIAGNOSIS — S42211D Unspecified displaced fracture of surgical neck of right humerus, subsequent encounter for fracture with routine healing: Secondary | ICD-10-CM | POA: Diagnosis not present

## 2020-04-12 DIAGNOSIS — R2681 Unsteadiness on feet: Secondary | ICD-10-CM | POA: Diagnosis not present

## 2020-04-12 DIAGNOSIS — D649 Anemia, unspecified: Secondary | ICD-10-CM | POA: Diagnosis not present

## 2020-04-12 DIAGNOSIS — J432 Centrilobular emphysema: Secondary | ICD-10-CM

## 2020-04-12 DIAGNOSIS — Y92009 Unspecified place in unspecified non-institutional (private) residence as the place of occurrence of the external cause: Secondary | ICD-10-CM | POA: Diagnosis not present

## 2020-04-12 DIAGNOSIS — R0902 Hypoxemia: Secondary | ICD-10-CM | POA: Diagnosis not present

## 2020-04-12 DIAGNOSIS — R1312 Dysphagia, oropharyngeal phase: Secondary | ICD-10-CM | POA: Diagnosis not present

## 2020-04-12 DIAGNOSIS — E039 Hypothyroidism, unspecified: Secondary | ICD-10-CM | POA: Diagnosis not present

## 2020-04-12 DIAGNOSIS — E611 Iron deficiency: Secondary | ICD-10-CM | POA: Diagnosis not present

## 2020-04-12 DIAGNOSIS — R778 Other specified abnormalities of plasma proteins: Secondary | ICD-10-CM | POA: Diagnosis not present

## 2020-04-12 DIAGNOSIS — J449 Chronic obstructive pulmonary disease, unspecified: Secondary | ICD-10-CM | POA: Diagnosis not present

## 2020-04-12 DIAGNOSIS — S42212D Unspecified displaced fracture of surgical neck of left humerus, subsequent encounter for fracture with routine healing: Secondary | ICD-10-CM | POA: Diagnosis not present

## 2020-04-12 DIAGNOSIS — I4891 Unspecified atrial fibrillation: Secondary | ICD-10-CM | POA: Diagnosis not present

## 2020-04-12 DIAGNOSIS — N1831 Chronic kidney disease, stage 3a: Secondary | ICD-10-CM | POA: Diagnosis not present

## 2020-04-12 DIAGNOSIS — R0602 Shortness of breath: Secondary | ICD-10-CM | POA: Diagnosis not present

## 2020-04-12 DIAGNOSIS — W010XXA Fall on same level from slipping, tripping and stumbling without subsequent striking against object, initial encounter: Secondary | ICD-10-CM | POA: Diagnosis not present

## 2020-04-12 DIAGNOSIS — Z23 Encounter for immunization: Secondary | ICD-10-CM | POA: Diagnosis not present

## 2020-04-12 DIAGNOSIS — M2550 Pain in unspecified joint: Secondary | ICD-10-CM | POA: Diagnosis not present

## 2020-04-12 DIAGNOSIS — R410 Disorientation, unspecified: Secondary | ICD-10-CM | POA: Diagnosis not present

## 2020-04-12 DIAGNOSIS — M6281 Muscle weakness (generalized): Secondary | ICD-10-CM | POA: Diagnosis not present

## 2020-04-12 DIAGNOSIS — R488 Other symbolic dysfunctions: Secondary | ICD-10-CM | POA: Diagnosis not present

## 2020-04-12 DIAGNOSIS — E569 Vitamin deficiency, unspecified: Secondary | ICD-10-CM | POA: Diagnosis not present

## 2020-04-12 DIAGNOSIS — Z743 Need for continuous supervision: Secondary | ICD-10-CM | POA: Diagnosis not present

## 2020-04-12 DIAGNOSIS — R262 Difficulty in walking, not elsewhere classified: Secondary | ICD-10-CM | POA: Diagnosis not present

## 2020-04-12 DIAGNOSIS — D500 Iron deficiency anemia secondary to blood loss (chronic): Secondary | ICD-10-CM

## 2020-04-12 LAB — CBC
HCT: 28.4 % — ABNORMAL LOW (ref 36.0–46.0)
Hemoglobin: 9.4 g/dL — ABNORMAL LOW (ref 12.0–15.0)
MCH: 29.6 pg (ref 26.0–34.0)
MCHC: 33.1 g/dL (ref 30.0–36.0)
MCV: 89.3 fL (ref 80.0–100.0)
Platelets: 131 10*3/uL — ABNORMAL LOW (ref 150–400)
RBC: 3.18 MIL/uL — ABNORMAL LOW (ref 3.87–5.11)
RDW: 13.9 % (ref 11.5–15.5)
WBC: 9.8 10*3/uL (ref 4.0–10.5)
nRBC: 0 % (ref 0.0–0.2)

## 2020-04-12 LAB — FERRITIN: Ferritin: 36 ng/mL (ref 11–307)

## 2020-04-12 LAB — RETICULOCYTES
Immature Retic Fract: 20.2 % — ABNORMAL HIGH (ref 2.3–15.9)
RBC.: 3.14 MIL/uL — ABNORMAL LOW (ref 3.87–5.11)
Retic Count, Absolute: 73.8 10*3/uL (ref 19.0–186.0)
Retic Ct Pct: 2.4 % (ref 0.4–3.1)

## 2020-04-12 LAB — RENAL FUNCTION PANEL
Albumin: 2.8 g/dL — ABNORMAL LOW (ref 3.5–5.0)
Anion gap: 8 (ref 5–15)
BUN: 16 mg/dL (ref 8–23)
CO2: 26 mmol/L (ref 22–32)
Calcium: 8.1 mg/dL — ABNORMAL LOW (ref 8.9–10.3)
Chloride: 100 mmol/L (ref 98–111)
Creatinine, Ser: 0.78 mg/dL (ref 0.44–1.00)
GFR calc Af Amer: >60 mL/min (ref >60)
GFR calc non Af Amer: >60 mL/min (ref >60)
Glucose, Bld: 107 mg/dL — ABNORMAL HIGH (ref 70–99)
Phosphorus: 2.2 mg/dL — ABNORMAL LOW (ref 2.5–4.6)
Potassium: 3.6 mmol/L (ref 3.5–5.1)
Sodium: 134 mmol/L — ABNORMAL LOW (ref 135–145)

## 2020-04-12 LAB — FOLATE: Folate: 30 ng/mL (ref >5.9)

## 2020-04-12 LAB — VITAMIN B12: Vitamin B-12: 124 pg/mL — ABNORMAL LOW (ref 180–914)

## 2020-04-12 LAB — IRON AND TIBC
Iron: 14 ug/dL — ABNORMAL LOW (ref 28–170)
Saturation Ratios: 6 % — ABNORMAL LOW (ref 10.4–31.8)
TIBC: 218 ug/dL — ABNORMAL LOW (ref 250–450)
UIBC: 204 ug/dL

## 2020-04-12 LAB — MAGNESIUM: Magnesium: 1.8 mg/dL (ref 1.7–2.4)

## 2020-04-12 MED ORDER — POLYETHYLENE GLYCOL 3350 17 G PO PACK: 17.0000 g | PACK | Freq: Two times a day (BID) | ORAL | 0 refills | Status: DC | PRN

## 2020-04-12 MED ORDER — SENNOSIDES-DOCUSATE SODIUM 8.6-50 MG PO TABS: 1.0000 | ORAL_TABLET | Freq: Two times a day (BID) | ORAL | Status: DC | PRN

## 2020-04-12 MED ORDER — HYDROCODONE-ACETAMINOPHEN 5-325 MG PO TABS: 1.0000 | ORAL_TABLET | Freq: Four times a day (QID) | ORAL | 0 refills | Status: AC | PRN

## 2020-04-12 NOTE — TOC Progression Note (Addendum)
Transition of Care [facility]) - Progression Note    Patient Details  Name: [name] MRN: [id number] Date of Birth: 12/13/1927  Transition of Care [facility]) CM/SW Contact  Reniya Mcclees, Gardiner Rhyme, LCSW Phone Number: 04/12/2020, 9:20 AM  Clinical Narrative:  [facility]. Ref number [id number] approved 8/10-8/12 Monqiue Nicholason is the CM to follow. Bedside RN aware and will await MD rounds to make sure medical stable for transfer. Daughter aware-will be going to semi-private room until private is available.   10:00 AM-Now going to private room 705  Expected Discharge Plan: [place] Barriers to Discharge: Continued Medical Work up  Expected Discharge Plan and Services Expected Discharge Plan: [place] In-house Referral: Clinical Social Work   Post Acute Care Choice: [place] Living arrangements for the past 2 months: Apartment                                       Social Determinants of Health (SDOH) Interventions    Readmission Risk Interventions No flowsheet data found.

## 2020-04-12 NOTE — TOC Transition Note (Signed)
Transition of Care [facility]) - CM/SW Discharge Note   Patient Details  Name: [name] MRN: [id number] Date of Birth: October 19, 1927  [facility]) CM/SW Contact:  Elease Hashimoto, LCSW Phone Number: 04/12/2020, 10:20 AM   Clinical Narrative:   [facility] medically stable to [facility]. Bedside RN to call report to [phone]. Approved 8/10-8/12. Monique Nicholson-CM to follow. Ref [id number]. Pt and daughter aware of plan and agreeable. Going to private room 705. DC packet in chart along with DNR    Final next level of care: Skilled Nursing Facility Barriers to Discharge: Barriers Resolved   Patient Goals and CMS Choice Patient states their goals for this hospitalization and ongoing recovery are:: My MOm needs to be somewhere she can get care. CMS Medicare.gov Compare Post Acute Care list provided to:: Patient Represenative (must comment) (Debbie-daughter) Choice offered to / list presented to : Patient, Adult Children  Discharge Placement   Existing PASRR number confirmed : 04/11/20          Patient chooses bed at: [facility] Patient to be transferred to facility by: EMS Name of family member notified: Debbi-daughter Patient and family notified of of transfer: 04/12/20  Discharge Plan and Services In-house Referral: Clinical Social Work   Post Acute Care Choice: [place]                               Social Determinants of Health (SDOH) Interventions     Readmission Risk Interventions No flowsheet data found.

## 2020-04-12 NOTE — Discharge Summary (Signed)
Physician Discharge Summary  [name] DXA:[id number] DOB: 03-Jan-1928 DOA: 04/08/2020  PCP: Valerie Roys, DO  Admit date: 04/08/2020 Discharge date: 04/12/2020  Admitted From: Senior living apartment complex Disposition: SNF  Recommendations for Outpatient Follow-up:  1. Follow ups as below. 2. Recommend palliative medicine follow-up at SNF. 3. Please obtain CBC/BMP/Mag at follow up 4. Please follow up on the following pending results: None   Discharge Condition: Stable CODE STATUS: DNR/DNI   Contact information for follow-up providers    Lovell Sheehan, MD. Schedule an appointment as soon as possible for a visit in 1 week(s).   Specialty: Orthopedic Surgery Contact information: [place] [address] [phone]            Contact information for after-discharge care    Destination    [facility] Preferred SNF .   Service: Skilled Nursing Contact information: [address] [place] [phone]                  Hospital Course: 86 year old female with PMH of COPD on 2 L, A. fib/PSVT, HTN, hypothyroidism, CKD-3B, colon cancer status post right colectomy, depression, anxiety and hearing impairment brought to ED by EMS after she slipped and fell while walking down a ramp.  She suffered an injury to her right face with large right-sided hematoma, and acute impacted and mildly displaced fracture through the surgical neck of the proximal left humerus and displaced fracture fragment arising from the greater tuberosity.  CT head and cervical spine without acute finding.  CT maxillofacial negative for acute facial fracture or intraorbital hematoma.  Hemodynamically stable except for mild tachycardia.  Basic labs and UA without significant finding.  High-sensitivity troponin elevated to 924.  EKG without acute ischemic finding.  Patient was admitted for further evaluation and management of possible  syncopal event.   High-sensitivity troponin peaked at 1176 and down to 914.  Cardiology did not feel this is a cardiac event or ACS.  Echocardiogram without significant finding (see details below)  Orthopedic surgery, Dr. Charlott Rakes consulted I recommended supportive care with sling and pain medication and outpatient follow-up in 1 to 2 weeks.  Patient went into A. fib with RVR briefly and converted back to sinus rhythm and remained stable since then.  Not a candidate for anticoagulation due to hematoma and risk of fall.  See individual problem list below for more on hospital course.  Discharge Diagnoses:  Mechanical fall at home-reportedly slipped and fell while walking down the ramp Right facial hematoma Left proximal humeral neck fracture -CT head/cervical spine/maxillofacial without significant finding other than right facial hematoma -Orthopedic surgery, Dr. Harlow Mares recommended sling, supportive care and outpatient follow-up in 2 weeks -Scheduled Tylenol with as needed Norco for pain control -Continue sling for LUE -Bowel regimen  Elevated troponin: High-sensitivity troponin 924> 1176>> 914.  Most likely demand ischemia.  No acute ischemic finding on EKG.  She denies chest pain.  Echocardiogram reassuring. -Discussed with cardiology, Dr. Clayborn Bigness.  He does not think this is ACS or cardiac event.   Paroxysmal A. fib with RVR/PSVT: Now in NSR with HR in 70s to 80s.  Echocardiogram reassuring. -Continue home metoprolol -No anticoagulation in the setting of hematoma  Chronic COPD/respiratory failure with hypoxia: On 2 L at home.  Stable. -Continue home breathing treatments and oxygen  Essential hypertension: BP within fair range. -Continue home amlodipine and Lopressor.  Hypothyroidism: -Continue Synthroid.  CKD stage IIIB: Stable. -Monitor intermittently  Mild hyponatremia: Resolved. -Continue monitoring  Anxiety: Stable. -Continue low-dose Atarax as needed.   Iron  deficiency anemia/thrombocytopenia: Baseline Hgb 12-13> 13.2 (admit)>>> 9.1>9.4.  Drop likely from hematoma and dilution.  Now improving.  Some degree of iron deficiency on anemia panel. -P.o. ferrous sulfate  Leukocytosis: Likely demargination versus infectious process.  Resolved without antibiotics  Hypophosphatemia: Replenished and improved  Debility/physical deconditioning -PT/OT  DNR/DNI-confirmed with patient's daughter over the phone on 8/8.  Patient with the above comorbidities.  Now with fall and LUE fracture.  -Outpatient palliative care follow-up   Body mass index is 18.36 kg/m. Nutrition Problem: Increased nutrient needs Etiology: acute illness Signs/Symptoms: estimated needs Interventions: Ensure Enlive (each supplement provides 350kcal and 20 grams of protein)      Discharge Exam: Vitals:   04/12/20 0442 04/12/20 0757  BP: (!) 140/59 117/88  Pulse: 88 83  Resp: 18 16  Temp: 99.2 F (37.3 C) 98.4 F (36.9 C)  SpO2: 93% 97%    GENERAL: Frail looking elderly female.  No apparent distress.  Nontoxic. HEENT: MMM.  Right facial bruising and ecchymosis.  Bulbar conjunctival injection/subconjunctival hemorrhage in the right.  PERRL.  EOMI.  Vision grossly intact.  Diminished hearing. NECK: Supple.  No apparent JVD.  RESP: On 2 L.  No IWOB.  Fair aeration bilaterally. CVS: Irregular rhythm.  Normal rate.Marland Kitchen Heart sounds normal.  ABD/GI/GU: Bowel sounds present. Soft. Non tender.  MSK/EXT:  Moves extremities.  LUE in sling.  Bruising over LUE proximally. SKIN: Bruising/ecchymosis over right face, and left upper arm NEURO: Awake, alert and oriented self and partial place.  Follows commands.  No apparent focal neuro deficit. PSYCH: Calm. Normal affect.  Discharge Instructions  Discharge Instructions    Diet general   Complete by: As directed    Increase activity slowly   Complete by: As directed      Allergies as of 04/12/2020      Reactions    Nortriptyline    Causes night wondering      Medication List    STOP taking these medications   aspirin EC 81 MG tablet     TAKE these medications   acetaminophen 325 MG tablet Commonly known as: TYLENOL Take 2 tablets (650 mg total) by mouth every 6 (six) hours as needed for mild pain or fever.   amLODipine 2.5 MG tablet Commonly known as: NORVASC Take 1 tablet (2.5 mg total) by mouth daily.   feeding supplement (ENSURE ENLIVE) Liqd Take 237 mLs by mouth 2 (two) times daily between meals.   ferrous sulfate 325 (65 FE) MG tablet Take 1 tablet (325 mg total) by mouth 3 (three) times daily with meals.   fluticasone 50 MCG/ACT nasal spray Commonly known as: FLONASE Place 2 sprays into both nostrils daily.   Fluticasone-Salmeterol 250-50 MCG/DOSE Aepb Commonly known as: Advair Diskus Inhale 1 puff into the lungs daily. Rinse mouth after use.   HYDROcodone-acetaminophen 5-325 MG tablet Commonly known as: NORCO/VICODIN Take 1 tablet by mouth every 6 (six) hours as needed for up to 5 days for moderate pain (Hold & Call MD if SBP<90, HR<65, RR<10, O2<90, or altered mental status.).   hydrOXYzine 10 MG tablet Commonly known as: ATARAX/VISTARIL Take 1 tablet (10 mg total) by mouth 3 (three) times daily as needed for anxiety.   latanoprost 0.005 % ophthalmic solution Commonly known as: XALATAN Place 1 drop into both eyes at bedtime.   levothyroxine 100 MCG tablet Commonly known as: Euthyrox Take 1 tablet (100  mcg total) by mouth daily.   metoprolol tartrate 50 MG tablet Commonly known as: LOPRESSOR Take 1 tablet (50 mg total) by mouth 2 (two) times daily.   multivitamin-lutein Caps capsule Take 1 capsule by mouth daily.   polyethylene glycol 17 g packet Commonly known as: MIRALAX / GLYCOLAX Take 17 g by mouth 2 (two) times daily as needed for mild constipation.   psyllium 58.6 % powder Commonly known as: METAMUCIL Take 1 packet by mouth daily.   senna-docusate  8.6-50 MG tablet Commonly known as: Senokot-S Take 1 tablet by mouth 2 (two) times daily as needed for moderate constipation.   solifenacin 10 MG tablet Commonly known as: VESICARE Take 1 tablet (10 mg total) by mouth daily.       Consultations:  Cardiology over the phone  Orthopedic surgery over the phone  Procedures/Studies: 1. Left ventricular ejection fraction, by estimation, is 60 to 65%. The  left ventricle has normal function. The left ventricle has no regional  wall motion abnormalities. Left ventricular diastolic function could not  be evaluated.  2. Right ventricular systolic function is normal. The right ventricular  size is normal. There is severely elevated pulmonary artery systolic  pressure.  3. The mitral valve is normal in structure. No evidence of mitral valve  regurgitation. No evidence of mitral stenosis.  4. The aortic valve is tricuspid. Aortic valve regurgitation is mild. No  aortic stenosis is present.  5. The inferior vena cava is normal in size with greater than 50%  respiratory variability, suggesting right atrial pressure of 3 mmHg.    DG Thoracic Spine 2 View  Result Date: 04/09/2020 CLINICAL DATA:  Fall.  Back pain EXAM: THORACIC SPINE 2 VIEWS COMPARISON:  None. FINDINGS: Sigmoid scoliosis of the thoracolumbar spine. Loss of vertebral body height. No subluxation. Normal paraspinal line. IMPRESSION: No acute findings of the thoracic spine. Electronically Signed   By: Suzy Bouchard M.D.   On: 04/09/2020 13:53   DG Lumbar Spine 2-3 Views  Result Date: 04/09/2020 CLINICAL DATA:  Inpatient.  Fall yesterday.  Back pain. EXAM: LUMBAR SPINE - 2-3 VIEW COMPARISON:  06/22/2018 CT angiogram of the abdomen and pelvis FINDINGS: This report assumes 5 non rib-bearing lumbar vertebrae. Moderate rotatory levoscoliosis of the lumbar spine. Lumbar vertebral body heights are preserved, with no fracture. Moderate multilevel degenerative disc disease throughout the  lumbar spine, most prominent at L5-S1. Stable minimal 2 mm retrolisthesis at L1-2 and L2-3. Moderate to marked bilateral lumbar facet arthropathy. No aggressive appearing focal osseous lesions. Abdominal aortic atherosclerosis. IMPRESSION: 1. No lumbar spine fracture or acute malalignment. 2. Moderate rotatory levoscoliosis of the lumbar spine. 3. Moderate multilevel lumbar degenerative disc disease, most prominent at L5-S1. 4. Moderate to marked bilateral lumbar facet arthropathy. Electronically Signed   By: Ilona Sorrel M.D.   On: 04/09/2020 13:55   CT HEAD WO CONTRAST  Result Date: 04/08/2020 CLINICAL DATA:  Trauma, followed EXAM: CT HEAD WITHOUT CONTRAST TECHNIQUE: Contiguous axial images were obtained from the base of the skull through the vertex without intravenous contrast. COMPARISON:  11/16/2018 FINDINGS: Brain: There is no acute intracranial hemorrhage, mass effect, or edema. Gray-white differentiation is preserved. There is no extra-axial fluid collection. Confluent areas of hypoattenuation in the supratentorial white matter are nonspecific but probably reflect advanced chronic microvascular ischemic changes similar to the prior study. Prominence of the ventricles and sulci reflects similar parenchymal volume loss. Vascular: There is atherosclerotic calcification at the skull base. Skull: Calvarium is unremarkable. Sinuses/Orbits: Refer  to maxillofacial CT. Other: Right frontal scalp and periorbital hematoma. IMPRESSION: No evidence of acute intracranial injury. Electronically Signed   By: Macy Mis M.D.   On: 04/08/2020 15:31   CT Cervical Spine Wo Contrast  Result Date: 04/08/2020 CLINICAL DATA:  Found down, facial trauma EXAM: CT CERVICAL SPINE WITHOUT CONTRAST TECHNIQUE: Multidetector CT imaging of the cervical spine was performed without intravenous contrast. Multiplanar CT image reconstructions were also generated. COMPARISON:  None. FINDINGS: Alignment: Retrolisthesis at C5-C6 and C6-C7.  Skull base and vertebrae: Degenerative plate irregularity at C5, C6, C7. Vertebral body heights are otherwise maintained. There is no acute fracture identified. Soft tissues and spinal canal: No prevertebral fluid or swelling. No visible canal hematoma. Disc levels: Multilevel degenerative changes are present including disc space narrowing, endplate osteophytes, and facet and uncovertebral hypertrophy. These changes are greatest at C5-C6 and C6-C7. Upper chest: Emphysema. IMPRESSION: No acute cervical spine fracture. Electronically Signed   By: Macy Mis M.D.   On: 04/08/2020 15:38   DG Chest Portable 1 View  Result Date: 04/08/2020 CLINICAL DATA:  Fall with swelling EXAM: PORTABLE CHEST 1 VIEW COMPARISON:  11/16/2018 FINDINGS: Small left-sided pleural effusion. No consolidation. Normal heart size. Aortic atherosclerosis. No pneumothorax. Acute comminuted fracture involving left humerus neck and greater tuberosity. IMPRESSION: Small left-sided pleural effusion. Acute comminuted fracture involving the left humerus neck and greater tuberosity. Electronically Signed   By: Donavan Foil M.D.   On: 04/08/2020 15:52   DG Shoulder Left  Result Date: 04/08/2020 CLINICAL DATA:  Pain status post fall EXAM: LEFT SHOULDER - 2+ VIEW COMPARISON:  None. FINDINGS: There is an acute impacted and mildly displaced fracture through the surgical neck of the proximal left humerus. There is a displaced fracture fragment arising from the greater tuberosity measuring approximately 2.7 cm. There are additional smaller adjacent fracture fragments. There is no dislocation. Osteopenia is noted. IMPRESSION: Neer 3 part acute fracture of the proximal left humerus without evidence for dislocation. Electronically Signed   By: Constance Holster M.D.   On: 04/08/2020 15:52   ECHOCARDIOGRAM COMPLETE  Result Date: 04/10/2020    ECHOCARDIOGRAM REPORT   Patient Name:   [name] Date of Exam: 04/10/2020 Medical Rec #:  [id number]         Height:       60.0 in Accession #:    [id number]       Weight:       83.0 lb Date of Birth:  1927/11/13       BSA:          1.284 m Patient Age:    13 years         BP:           140/51 mmHg Patient Gender: F                HR:           103 bpm. Exam Location:  [facility] Procedure: 2D Echo, Cardiac Doppler and Color Doppler Indications:     Elevated troponin  History:         Patient has prior history of Echocardiogram examinations, most                  recent 04/24/2018. Arrythmias:Atrial Fibrillation; Risk                  Factors:Hypertension.  Sonographer:     Sherrie Sport RDCS (AE) Referring Phys:  [id number] Cleaster Corin PATEL Diagnosing Phys: Jonelle Sidle  Oval Linsey MD  Sonographer Comments: Technically challenging study due to limited acoustic windows, no apical window and no subcostal window. Patient left arm is in a sling. IMPRESSIONS  1. Left ventricular ejection fraction, by estimation, is 60 to 65%. The left ventricle has normal function. The left ventricle has no regional wall motion abnormalities. Left ventricular diastolic function could not be evaluated.  2. Right ventricular systolic function is normal. The right ventricular size is normal. There is severely elevated pulmonary artery systolic pressure.  3. The mitral valve is normal in structure. No evidence of mitral valve regurgitation. No evidence of mitral stenosis.  4. The aortic valve is tricuspid. Aortic valve regurgitation is mild. No aortic stenosis is present.  5. The inferior vena cava is normal in size with greater than 50% respiratory variability, suggesting right atrial pressure of 3 mmHg. FINDINGS  Left Ventricle: Left ventricular ejection fraction, by estimation, is 60 to 65%. The left ventricle has normal function. The left ventricle has no regional wall motion abnormalities. The left ventricular internal cavity size was normal in size. There is  no left ventricular hypertrophy. Left ventricular diastolic function could not be evaluated. Right  Ventricle: The right ventricular size is normal. No increase in right ventricular wall thickness. Right ventricular systolic function is normal. There is severely elevated pulmonary artery systolic pressure. The tricuspid regurgitant velocity is 3.65 m/s, and with an assumed right atrial pressure of 10 mmHg, the estimated right ventricular systolic pressure is 78.2 mmHg. Left Atrium: Left atrial size was normal in size. Right Atrium: Right atrial size was normal in size. Pericardium: There is no evidence of pericardial effusion. Mitral Valve: The mitral valve is normal in structure. Normal mobility of the mitral valve leaflets. No evidence of mitral valve regurgitation. No evidence of mitral valve stenosis. Tricuspid Valve: The tricuspid valve is normal in structure. Tricuspid valve regurgitation is mild . No evidence of tricuspid stenosis. Aortic Valve: The aortic valve is tricuspid. Aortic valve regurgitation is mild. No aortic stenosis is present. Pulmonic Valve: The pulmonic valve was normal in structure. Pulmonic valve regurgitation is not visualized. No evidence of pulmonic stenosis. Aorta: The aortic root is normal in size and structure. Venous: The inferior vena cava is normal in size with greater than 50% respiratory variability, suggesting right atrial pressure of 3 mmHg. IAS/Shunts: No atrial level shunt detected by color flow Doppler. Additional Comments: Exam limited by patient's discomfort and left arm being in a sling.  LEFT VENTRICLE PLAX 2D LVIDd:         3.50 cm LVIDs:         2.03 cm LV PW:         0.94 cm LV IVS:        0.90 cm LVOT diam:     2.00 cm LVOT Area:     3.14 cm  LEFT ATRIUM         Index LA diam:    3.20 cm 2.49 cm/m                        PULMONIC VALVE AORTA                 PV Vmax:        0.67 m/s Ao Root diam: 3.20 cm PV Peak grad:   1.8 mmHg                       RVOT Peak grad: 2  mmHg  TRICUSPID VALVE TR Peak grad:   53.3 mmHg TR Vmax:        365.00 cm/s  SHUNTS Systemic Diam:  2.00 cm Skeet Latch MD Electronically signed by Skeet Latch MD Signature Date/Time: 04/10/2020/1:18:43 PM    Final    CT Maxillofacial Wo Contrast  Result Date: 04/08/2020 CLINICAL DATA:  Facial trauma EXAM: CT MAXILLOFACIAL WITHOUT CONTRAST TECHNIQUE: Multidetector CT imaging of the maxillofacial structures was performed. Multiplanar CT image reconstructions were also generated. COMPARISON:  None. FINDINGS: Osseous: No acute facial fracture. Degenerative changes of the right temporomandibular joint. Orbits: No intraorbital hematoma. Sinuses: No significant opacification. Soft tissues: Right frontal scalp, periorbital, and facial soft tissue swelling and hematoma. Limited intracranial: Dictated separately. IMPRESSION: No acute facial fracture. Electronically Signed   By: Macy Mis M.D.   On: 04/08/2020 15:33       The results of significant diagnostics from this hospitalization (including imaging, microbiology, ancillary and laboratory) are listed below for reference.     Microbiology: Recent Results (from the past 240 hour(s))  SARS Coronavirus 2 by RT PCR (hospital order, performed in [facility] hospital lab) Nasopharyngeal Nasopharyngeal Swab     Status: None   Collection Time: 04/08/20  5:42 PM   Specimen: Nasopharyngeal Swab  Result Value Ref Range Status   SARS Coronavirus 2 NEGATIVE NEGATIVE Final    Comment: (NOTE) SARS-CoV-2 target nucleic acids are NOT DETECTED.  The SARS-CoV-2 RNA is generally detectable in upper and lower respiratory specimens during the acute phase of infection. The lowest concentration of SARS-CoV-2 viral copies this assay can detect is 250 copies / mL. A negative result does not preclude SARS-CoV-2 infection and should not be used as the sole basis for treatment or other patient management decisions.  A negative result may occur with improper specimen collection / handling, submission of specimen other than nasopharyngeal swab, presence of  viral mutation(s) within the areas targeted by this assay, and inadequate number of viral copies (<250 copies / mL). A negative result must be combined with clinical observations, patient history, and epidemiological information.  Fact Sheet for Patients:   StrictlyIdeas.no  Fact Sheet for Healthcare Providers: BankingDealers.co.za  This test is not yet approved or  cleared by the [place] FDA and has been authorized for detection and/or diagnosis of SARS-CoV-2 by FDA under an Emergency Use Authorization (EUA).  This EUA will remain in effect (meaning this test can be used) for the duration of the COVID-19 declaration under Section 564(b)(1) of the Act, 21 U.S.C. section 360bbb-3(b)(1), unless the authorization is terminated or revoked sooner.  Performed at [facility], [address] [place], [place] [address]      Labs: BNP (last 3 results) No results for input(s): BNP in the last 8760 hours. Basic Metabolic Panel: Recent Labs  Lab 04/08/20 1535 04/09/20 0522 04/10/20 0418 04/11/20 0426 04/12/20 0419  NA 134* 134* 132* 135 134*  K 4.5 4.4 4.3 4.0 3.6  CL 100 100 98 102 100  CO2 24 24 27 26 26   GLUCOSE 141* 153* 112* 109* 107*  BUN 21 20 22 20 16   CREATININE 1.22* 1.14* 1.16* 0.89 0.78  CALCIUM 8.9 8.7* 8.6* 7.9* 8.1*  MG  --   --  2.0 1.8 1.8  PHOS  --   --  2.7 1.3* 2.2*   Liver Function Tests: Recent Labs  Lab 04/10/20 0418 04/11/20 0426 04/12/20 0419  ALBUMIN 3.3* 2.8* 2.8*   No results for input(s): LIPASE, AMYLASE  in the last 168 hours. No results for input(s): AMMONIA in the last 168 hours. CBC: Recent Labs  Lab 04/08/20 1535 04/09/20 0522 04/09/20 1750 04/11/20 0426 04/12/20 0419  WBC 14.3* 22.2* 21.5* 8.2 9.8  NEUTROABS  --   --  18.8*  --   --   HGB 13.2 12.4 11.9* 9.1* 9.4*  HCT 38.5 35.7* 35.3* 28.0* 28.4*  MCV 85.9 85.4 88.3 89.7 89.3  PLT 183 151 152 122* 131*   Cardiac  Enzymes: Recent Labs  Lab 04/10/20 0418  CKTOTAL 77   BNP: Invalid input(s): POCBNP CBG: Recent Labs  Lab 04/09/20 0518 04/10/20 0532 04/11/20 0522  GLUCAP 154* 110* 147*   D-Dimer No results for input(s): DDIMER in the last 72 hours. Hgb A1c No results for input(s): HGBA1C in the last 72 hours. Lipid Profile No results for input(s): CHOL, HDL, LDLCALC, TRIG, CHOLHDL, LDLDIRECT in the last 72 hours. Thyroid function studies No results for input(s): TSH, T4TOTAL, T3FREE, THYROIDAB in the last 72 hours.  Invalid input(s): FREET3 Anemia work up Recent Labs    04/12/20 0419  FOLATE 30.0  FERRITIN 36  TIBC 218*  IRON 14*  RETICCTPCT 2.4   Urinalysis    Component Value Date/Time   COLORURINE YELLOW (A) 04/08/2020 1644   APPEARANCEUR HAZY (A) 04/08/2020 1644   APPEARANCEUR Clear 01/05/2020 0939   LABSPEC 1.011 04/08/2020 1644   PHURINE 7.0 04/08/2020 1644   GLUCOSEU NEGATIVE 04/08/2020 1644   HGBUR SMALL (A) 04/08/2020 [place] 04/08/2020 1644   BILIRUBINUR Negative 01/05/2020 0939   KETONESUR 5 (A) 04/08/2020 1644   PROTEINUR NEGATIVE 04/08/2020 1644   NITRITE NEGATIVE 04/08/2020 1644   LEUKOCYTESUR NEGATIVE 04/08/2020 1644   Sepsis Labs Invalid input(s): PROCALCITONIN,  WBC,  LACTICIDVEN   Time coordinating discharge: 35 minutes  SIGNED:  Mercy Riding, MD  [facility] 04/12/2020, 10:23 AM  If 7PM-7AM, please contact night-coverage [email or web address] Password [email or web address]

## 2020-04-12 NOTE — Plan of Care (Signed)
Patient discharged to [facility] per MD order. Report called to DeeDee at facility.

## 2020-04-14 ENCOUNTER — Telehealth: Payer: Self-pay | Admitting: Family Medicine

## 2020-04-14 DIAGNOSIS — I10 Essential (primary) hypertension: Secondary | ICD-10-CM | POA: Diagnosis not present

## 2020-04-14 DIAGNOSIS — R296 Repeated falls: Secondary | ICD-10-CM | POA: Diagnosis not present

## 2020-04-14 DIAGNOSIS — E039 Hypothyroidism, unspecified: Secondary | ICD-10-CM | POA: Diagnosis not present

## 2020-04-14 DIAGNOSIS — S42302D Unspecified fracture of shaft of humerus, left arm, subsequent encounter for fracture with routine healing: Secondary | ICD-10-CM | POA: Diagnosis not present

## 2020-04-14 DIAGNOSIS — I4891 Unspecified atrial fibrillation: Secondary | ICD-10-CM | POA: Diagnosis not present

## 2020-04-14 NOTE — Telephone Encounter (Signed)
Patient's daughter [name] is calling to let Dr. Wynetta Emery know that the patient had a bad fall and is now in [place]. Thank you

## 2020-04-24 DIAGNOSIS — I10 Essential (primary) hypertension: Secondary | ICD-10-CM | POA: Diagnosis not present

## 2020-04-24 DIAGNOSIS — R296 Repeated falls: Secondary | ICD-10-CM | POA: Diagnosis not present

## 2020-04-24 DIAGNOSIS — S42302D Unspecified fracture of shaft of humerus, left arm, subsequent encounter for fracture with routine healing: Secondary | ICD-10-CM | POA: Diagnosis not present

## 2020-04-24 DIAGNOSIS — I4891 Unspecified atrial fibrillation: Secondary | ICD-10-CM | POA: Diagnosis not present

## 2020-04-24 DIAGNOSIS — D649 Anemia, unspecified: Secondary | ICD-10-CM | POA: Diagnosis not present

## 2020-04-29 ENCOUNTER — Telehealth: Payer: Self-pay

## 2020-05-03 NOTE — Telephone Encounter (Unsigned)
Copied from [place] [phone]. Topic: General - Other >> May 03, 2020  3:42 PM [name] Pines A wrote: Patients family member is requesting a callback I nregards to what Dr. Wynetta Emery would advise about patient being kicked out of [facility] and was advised by their facility to get assisted living. Patient has to be out by Friday 9/3.  Best contact [phone]

## 2020-05-05 DIAGNOSIS — R262 Difficulty in walking, not elsewhere classified: Secondary | ICD-10-CM | POA: Diagnosis not present

## 2020-05-05 DIAGNOSIS — R0602 Shortness of breath: Secondary | ICD-10-CM | POA: Diagnosis not present

## 2020-05-05 DIAGNOSIS — S42211D Unspecified displaced fracture of surgical neck of right humerus, subsequent encounter for fracture with routine healing: Secondary | ICD-10-CM | POA: Diagnosis not present

## 2020-05-05 DIAGNOSIS — R296 Repeated falls: Secondary | ICD-10-CM | POA: Diagnosis not present

## 2020-05-05 DIAGNOSIS — I4891 Unspecified atrial fibrillation: Secondary | ICD-10-CM | POA: Diagnosis not present

## 2020-05-05 DIAGNOSIS — M6281 Muscle weakness (generalized): Secondary | ICD-10-CM | POA: Diagnosis not present

## 2020-05-05 DIAGNOSIS — S42302D Unspecified fracture of shaft of humerus, left arm, subsequent encounter for fracture with routine healing: Secondary | ICD-10-CM | POA: Diagnosis not present

## 2020-05-05 DIAGNOSIS — E039 Hypothyroidism, unspecified: Secondary | ICD-10-CM | POA: Diagnosis not present

## 2020-05-05 DIAGNOSIS — I10 Essential (primary) hypertension: Secondary | ICD-10-CM | POA: Diagnosis not present

## 2020-05-05 NOTE — Telephone Encounter (Signed)
Pt daughter [name] is calling and would like callback asap. Pt does have an appt with dr Wynetta Emery on 05-10-2020. Pt daughter is also concern about her medications

## 2020-05-05 NOTE — Telephone Encounter (Signed)
Called pt's daughter [name], she stated that [facility] has a room and will allow her to stay for now, she states that she will call us with any other changes. Only wanted to make sure that she had proper medicine if she needed to bring her home tomorrow.

## 2020-05-06 DIAGNOSIS — E611 Iron deficiency: Secondary | ICD-10-CM | POA: Diagnosis not present

## 2020-05-06 DIAGNOSIS — D649 Anemia, unspecified: Secondary | ICD-10-CM | POA: Diagnosis not present

## 2020-05-06 DIAGNOSIS — E039 Hypothyroidism, unspecified: Secondary | ICD-10-CM | POA: Diagnosis not present

## 2020-05-09 DIAGNOSIS — J432 Centrilobular emphysema: Secondary | ICD-10-CM | POA: Diagnosis not present

## 2020-05-09 DIAGNOSIS — J449 Chronic obstructive pulmonary disease, unspecified: Secondary | ICD-10-CM | POA: Diagnosis not present

## 2020-05-10 ENCOUNTER — Ambulatory Visit: Payer: Medicare Other | Admitting: Family Medicine

## 2020-05-17 DIAGNOSIS — S42212A Unspecified displaced fracture of surgical neck of left humerus, initial encounter for closed fracture: Secondary | ICD-10-CM | POA: Diagnosis not present

## 2020-05-19 DIAGNOSIS — I4891 Unspecified atrial fibrillation: Secondary | ICD-10-CM | POA: Diagnosis not present

## 2020-05-19 DIAGNOSIS — I10 Essential (primary) hypertension: Secondary | ICD-10-CM | POA: Diagnosis not present

## 2020-05-19 DIAGNOSIS — R296 Repeated falls: Secondary | ICD-10-CM | POA: Diagnosis not present

## 2020-05-19 DIAGNOSIS — S42302D Unspecified fracture of shaft of humerus, left arm, subsequent encounter for fracture with routine healing: Secondary | ICD-10-CM | POA: Diagnosis not present

## 2020-05-19 DIAGNOSIS — D649 Anemia, unspecified: Secondary | ICD-10-CM | POA: Diagnosis not present

## 2020-06-07 DIAGNOSIS — J449 Chronic obstructive pulmonary disease, unspecified: Secondary | ICD-10-CM | POA: Diagnosis not present

## 2020-06-07 DIAGNOSIS — R2681 Unsteadiness on feet: Secondary | ICD-10-CM | POA: Diagnosis not present

## 2020-06-07 DIAGNOSIS — E611 Iron deficiency: Secondary | ICD-10-CM | POA: Diagnosis not present

## 2020-06-07 DIAGNOSIS — M6281 Muscle weakness (generalized): Secondary | ICD-10-CM | POA: Diagnosis not present

## 2020-06-07 DIAGNOSIS — R1312 Dysphagia, oropharyngeal phase: Secondary | ICD-10-CM | POA: Diagnosis not present

## 2020-06-07 DIAGNOSIS — Z23 Encounter for immunization: Secondary | ICD-10-CM | POA: Diagnosis not present

## 2020-06-07 DIAGNOSIS — R488 Other symbolic dysfunctions: Secondary | ICD-10-CM | POA: Diagnosis not present

## 2020-06-07 DIAGNOSIS — R262 Difficulty in walking, not elsewhere classified: Secondary | ICD-10-CM | POA: Diagnosis not present

## 2020-06-07 DIAGNOSIS — E039 Hypothyroidism, unspecified: Secondary | ICD-10-CM | POA: Diagnosis not present

## 2020-06-07 DIAGNOSIS — D500 Iron deficiency anemia secondary to blood loss (chronic): Secondary | ICD-10-CM | POA: Diagnosis not present

## 2020-06-07 DIAGNOSIS — E569 Vitamin deficiency, unspecified: Secondary | ICD-10-CM | POA: Diagnosis not present

## 2020-06-07 DIAGNOSIS — I10 Essential (primary) hypertension: Secondary | ICD-10-CM | POA: Diagnosis not present

## 2020-06-16 DIAGNOSIS — E039 Hypothyroidism, unspecified: Secondary | ICD-10-CM | POA: Diagnosis not present

## 2020-06-16 DIAGNOSIS — I4891 Unspecified atrial fibrillation: Secondary | ICD-10-CM | POA: Diagnosis not present

## 2020-06-16 DIAGNOSIS — R296 Repeated falls: Secondary | ICD-10-CM | POA: Diagnosis not present

## 2020-06-16 DIAGNOSIS — S42302D Unspecified fracture of shaft of humerus, left arm, subsequent encounter for fracture with routine healing: Secondary | ICD-10-CM | POA: Diagnosis not present

## 2020-06-16 DIAGNOSIS — I10 Essential (primary) hypertension: Secondary | ICD-10-CM | POA: Diagnosis not present

## 2020-07-14 ENCOUNTER — Other Ambulatory Visit: Payer: Self-pay

## 2020-07-14 DIAGNOSIS — Z85038 Personal history of other malignant neoplasm of large intestine: Secondary | ICD-10-CM

## 2020-08-08 ENCOUNTER — Ambulatory Visit: Payer: Medicare Other | Admitting: Surgery

## 2020-09-20 ENCOUNTER — Encounter: Payer: Medicare Other | Admitting: Family Medicine

## 2024-06-08 ENCOUNTER — Telehealth: Payer: Self-pay

## 2024-06-08 NOTE — Telephone Encounter (Signed)
 Copied from CRM #[id number]. Topic: General - Other >> Jun 08, 2024 10:35 AM Hamdi H wrote: Reason for CRM: Patient is in major declining health. Health and human services keeps requesting different health preventitive services for the patient. The patient is in a long term facility and is seeing their doctors. They want the patient taken off of our patient list since she won't be seeing us  anymore.

## 2024-06-08 NOTE — Telephone Encounter (Signed)
 I have not seen this patient since 2021- We just need the name of her facility to reassign her, but I'm not sure what else they need us  to do.

## 2024-06-08 NOTE — Telephone Encounter (Signed)
 Patient name sent to [facility] for reassignment.

## 2024-07-04 DEATH — deceased
